# Patient Record
Sex: Female | Born: 1985 | Race: Black or African American | Hispanic: No | Marital: Single | Smoking: Former smoker
Health system: Southern US, Community
[De-identification: ages and names within clinical notes are randomized; demographics above are authoritative.]

## PROBLEM LIST (undated history)

## (undated) ENCOUNTER — Inpatient Hospital Stay (HOSPITAL_COMMUNITY): Payer: Self-pay

## (undated) ENCOUNTER — Emergency Department (HOSPITAL_COMMUNITY): Disposition: A | Discharge status: Home / Self Care | Payer: Medicaid Other

## (undated) DIAGNOSIS — N83209 Unspecified ovarian cyst, unspecified side: Secondary | ICD-10-CM

## (undated) DIAGNOSIS — A6000 Herpesviral infection of urogenital system, unspecified: Secondary | ICD-10-CM

## (undated) DIAGNOSIS — C801 Malignant (primary) neoplasm, unspecified: Secondary | ICD-10-CM

## (undated) DIAGNOSIS — F32A Depression, unspecified: Secondary | ICD-10-CM

## (undated) DIAGNOSIS — G43909 Migraine, unspecified, not intractable, without status migrainosus: Secondary | ICD-10-CM

## (undated) DIAGNOSIS — O139 Gestational [pregnancy-induced] hypertension without significant proteinuria, unspecified trimester: Secondary | ICD-10-CM

## (undated) DIAGNOSIS — A549 Gonococcal infection, unspecified: Secondary | ICD-10-CM

## (undated) DIAGNOSIS — Q613 Polycystic kidney, unspecified: Secondary | ICD-10-CM

## (undated) DIAGNOSIS — F419 Anxiety disorder, unspecified: Secondary | ICD-10-CM

## (undated) DIAGNOSIS — O24419 Gestational diabetes mellitus in pregnancy, unspecified control: Secondary | ICD-10-CM

## (undated) DIAGNOSIS — A749 Chlamydial infection, unspecified: Secondary | ICD-10-CM

## (undated) DIAGNOSIS — Z8719 Personal history of other diseases of the digestive system: Secondary | ICD-10-CM

## (undated) DIAGNOSIS — K219 Gastro-esophageal reflux disease without esophagitis: Secondary | ICD-10-CM

## (undated) DIAGNOSIS — R87619 Unspecified abnormal cytological findings in specimens from cervix uteri: Secondary | ICD-10-CM

## (undated) DIAGNOSIS — M1990 Unspecified osteoarthritis, unspecified site: Secondary | ICD-10-CM

## (undated) DIAGNOSIS — D649 Anemia, unspecified: Secondary | ICD-10-CM

## (undated) DIAGNOSIS — F319 Bipolar disorder, unspecified: Secondary | ICD-10-CM

## (undated) DIAGNOSIS — F329 Major depressive disorder, single episode, unspecified: Secondary | ICD-10-CM

## (undated) DIAGNOSIS — O1490 Unspecified pre-eclampsia, unspecified trimester: Secondary | ICD-10-CM

## (undated) DIAGNOSIS — Z923 Personal history of irradiation: Secondary | ICD-10-CM

## (undated) HISTORY — PX: DILATION AND CURETTAGE OF UTERUS: SHX78

## (undated) HISTORY — DX: Gestational diabetes mellitus in pregnancy, unspecified control: O24.419

## (undated) HISTORY — PX: COLPOSCOPY: SHX161

## (undated) HISTORY — PX: TONSILLECTOMY: SUR1361

---
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
## 2006-02-10 ENCOUNTER — Inpatient Hospital Stay (HOSPITAL_COMMUNITY): Admission: AD | Admit: 2006-02-10 | Discharge: 2006-02-10 | Payer: Self-pay | Admitting: Family Medicine

## 2006-02-23 ENCOUNTER — Inpatient Hospital Stay (HOSPITAL_COMMUNITY): Admission: AD | Admit: 2006-02-23 | Discharge: 2006-02-23 | Payer: Self-pay | Admitting: Gynecology

## 2006-03-25 ENCOUNTER — Inpatient Hospital Stay (HOSPITAL_COMMUNITY): Admission: AD | Admit: 2006-03-25 | Discharge: 2006-03-25 | Payer: Self-pay | Admitting: Family Medicine

## 2006-04-26 ENCOUNTER — Inpatient Hospital Stay (HOSPITAL_COMMUNITY): Admission: AD | Admit: 2006-04-26 | Discharge: 2006-04-26 | Payer: Self-pay | Admitting: Obstetrics and Gynecology

## 2006-06-26 ENCOUNTER — Inpatient Hospital Stay (HOSPITAL_COMMUNITY): Admission: AD | Admit: 2006-06-26 | Discharge: 2006-06-26 | Payer: Self-pay | Admitting: Obstetrics

## 2006-07-04 ENCOUNTER — Inpatient Hospital Stay (HOSPITAL_COMMUNITY): Admission: AD | Admit: 2006-07-04 | Discharge: 2006-07-04 | Payer: Self-pay | Admitting: Obstetrics

## 2006-08-05 ENCOUNTER — Inpatient Hospital Stay (HOSPITAL_COMMUNITY): Admission: AD | Admit: 2006-08-05 | Discharge: 2006-08-05 | Payer: Self-pay | Admitting: Obstetrics and Gynecology

## 2006-08-05 ENCOUNTER — Ambulatory Visit: Payer: Self-pay | Admitting: Obstetrics and Gynecology

## 2006-09-06 ENCOUNTER — Inpatient Hospital Stay (HOSPITAL_COMMUNITY): Admission: AD | Admit: 2006-09-06 | Discharge: 2006-09-07 | Payer: Self-pay | Admitting: Obstetrics and Gynecology

## 2006-09-09 ENCOUNTER — Inpatient Hospital Stay (HOSPITAL_COMMUNITY): Admission: AD | Admit: 2006-09-09 | Discharge: 2006-09-09 | Payer: Self-pay | Admitting: Obstetrics and Gynecology

## 2006-09-25 ENCOUNTER — Inpatient Hospital Stay (HOSPITAL_COMMUNITY): Admission: AD | Admit: 2006-09-25 | Discharge: 2006-09-25 | Payer: Self-pay | Admitting: Obstetrics and Gynecology

## 2006-10-05 ENCOUNTER — Inpatient Hospital Stay (HOSPITAL_COMMUNITY): Admission: RE | Admit: 2006-10-05 | Discharge: 2006-10-07 | Payer: Self-pay | Admitting: Obstetrics and Gynecology

## 2007-03-10 ENCOUNTER — Inpatient Hospital Stay (HOSPITAL_COMMUNITY): Admission: AD | Admit: 2007-03-10 | Discharge: 2007-03-10 | Payer: Self-pay | Admitting: Obstetrics and Gynecology

## 2007-08-04 ENCOUNTER — Emergency Department (HOSPITAL_COMMUNITY): Admission: EM | Admit: 2007-08-04 | Discharge: 2007-08-04 | Payer: Self-pay | Admitting: Emergency Medicine

## 2008-07-05 ENCOUNTER — Emergency Department (HOSPITAL_COMMUNITY): Admission: EM | Admit: 2008-07-05 | Discharge: 2008-07-05 | Payer: Self-pay | Admitting: Emergency Medicine

## 2009-03-05 ENCOUNTER — Ambulatory Visit: Payer: Self-pay | Admitting: Physician Assistant

## 2009-03-05 ENCOUNTER — Inpatient Hospital Stay (HOSPITAL_COMMUNITY): Admission: AD | Admit: 2009-03-05 | Discharge: 2009-03-05 | Payer: Self-pay | Admitting: Obstetrics & Gynecology

## 2009-05-16 ENCOUNTER — Inpatient Hospital Stay (HOSPITAL_COMMUNITY): Admission: AD | Admit: 2009-05-16 | Discharge: 2009-05-16 | Payer: Self-pay | Admitting: Obstetrics & Gynecology

## 2009-11-14 ENCOUNTER — Emergency Department (HOSPITAL_COMMUNITY): Admission: EM | Admit: 2009-11-14 | Discharge: 2009-11-14 | Payer: Self-pay | Admitting: Family Medicine

## 2010-04-14 ENCOUNTER — Inpatient Hospital Stay (HOSPITAL_COMMUNITY)
Admission: AD | Admit: 2010-04-14 | Discharge: 2010-04-14 | Payer: Self-pay | Source: Home / Self Care | Attending: Obstetrics & Gynecology | Admitting: Obstetrics & Gynecology

## 2010-06-10 ENCOUNTER — Inpatient Hospital Stay (HOSPITAL_COMMUNITY)
Admission: AD | Admit: 2010-06-10 | Discharge: 2010-06-10 | Disposition: A | Discharge status: Home / Self Care | Payer: Self-pay | Source: Ambulatory Visit | Attending: Obstetrics and Gynecology | Admitting: Obstetrics and Gynecology

## 2010-06-10 DIAGNOSIS — N949 Unspecified condition associated with female genital organs and menstrual cycle: Secondary | ICD-10-CM | POA: Insufficient documentation

## 2010-06-10 DIAGNOSIS — A6000 Herpesviral infection of urogenital system, unspecified: Secondary | ICD-10-CM | POA: Insufficient documentation

## 2010-06-10 LAB — WET PREP, GENITAL
Clue Cells Wet Prep HPF POC: NONE SEEN
Trich, Wet Prep: NONE SEEN
Yeast Wet Prep HPF POC: NONE SEEN

## 2010-06-14 LAB — GC/CHLAMYDIA PROBE AMP, GENITAL
Chlamydia, DNA Probe: NEGATIVE
GC Probe Amp, Genital: NEGATIVE

## 2010-07-08 ENCOUNTER — Encounter: Payer: Self-pay | Admitting: Obstetrics and Gynecology

## 2010-07-11 LAB — CBC
HCT: 36.3 % (ref 36.0–46.0)
Hemoglobin: 12.2 g/dL (ref 12.0–15.0)
MCH: 28.2 pg (ref 26.0–34.0)
MCHC: 33.6 g/dL (ref 30.0–36.0)
MCV: 83.8 fL (ref 78.0–100.0)
Platelets: 251 10*3/uL (ref 150–400)
RBC: 4.33 MIL/uL (ref 3.87–5.11)
RDW: 13.2 % (ref 11.5–15.5)
WBC: 7.5 10*3/uL (ref 4.0–10.5)

## 2010-07-11 LAB — WET PREP, GENITAL
Trich, Wet Prep: NONE SEEN
Yeast Wet Prep HPF POC: NONE SEEN

## 2010-07-11 LAB — URINALYSIS, ROUTINE W REFLEX MICROSCOPIC
Bilirubin Urine: NEGATIVE
Glucose, UA: NEGATIVE mg/dL
Hgb urine dipstick: NEGATIVE
Ketones, ur: NEGATIVE mg/dL
Nitrite: NEGATIVE
Protein, ur: NEGATIVE mg/dL
Specific Gravity, Urine: 1.015 (ref 1.005–1.030)
Urobilinogen, UA: 0.2 mg/dL (ref 0.0–1.0)
pH: 7 (ref 5.0–8.0)

## 2010-07-11 LAB — ABO/RH: ABO/RH(D): AB POS

## 2010-07-11 LAB — GC/CHLAMYDIA PROBE AMP, GENITAL
Chlamydia, DNA Probe: NEGATIVE
GC Probe Amp, Genital: NEGATIVE

## 2010-07-11 LAB — HCG, QUANTITATIVE, PREGNANCY: hCG, Beta Chain, Quant, S: 86037 m[IU]/mL — ABNORMAL HIGH (ref <5)

## 2010-07-11 LAB — POCT PREGNANCY, URINE: Preg Test, Ur: POSITIVE

## 2010-07-11 LAB — URINE MICROSCOPIC-ADD ON

## 2010-07-16 LAB — POCT URINALYSIS DIP (DEVICE)
Bilirubin Urine: NEGATIVE
Glucose, UA: NEGATIVE mg/dL
Hgb urine dipstick: NEGATIVE
Ketones, ur: NEGATIVE mg/dL
Nitrite: NEGATIVE
Protein, ur: NEGATIVE mg/dL
Specific Gravity, Urine: 1.025 (ref 1.005–1.030)
Urobilinogen, UA: 0.2 mg/dL (ref 0.0–1.0)
pH: 6 (ref 5.0–8.0)

## 2010-07-16 LAB — POCT PREGNANCY, URINE: Preg Test, Ur: NEGATIVE

## 2010-07-16 LAB — GC/CHLAMYDIA PROBE AMP, GENITAL: GC Probe Amp, Genital: POSITIVE — AB

## 2010-07-16 LAB — WET PREP, GENITAL: Trich, Wet Prep: NONE SEEN

## 2010-07-17 LAB — CBC
Hemoglobin: 12.4 g/dL (ref 12.0–15.0)
Platelets: 230 10*3/uL (ref 150–400)
RDW: 13 % (ref 11.5–15.5)
WBC: 6.5 10*3/uL (ref 4.0–10.5)

## 2010-07-17 LAB — URINALYSIS, ROUTINE W REFLEX MICROSCOPIC
Glucose, UA: NEGATIVE mg/dL
Ketones, ur: NEGATIVE mg/dL
Nitrite: NEGATIVE
pH: 6 (ref 5.0–8.0)

## 2010-07-17 LAB — GC/CHLAMYDIA PROBE AMP, GENITAL
Chlamydia, DNA Probe: POSITIVE — AB
GC Probe Amp, Genital: POSITIVE — AB

## 2010-07-17 LAB — POCT PREGNANCY, URINE: Preg Test, Ur: POSITIVE

## 2010-07-17 LAB — ABO/RH: ABO/RH(D): AB POS

## 2010-07-17 LAB — WET PREP, GENITAL: Trich, Wet Prep: NONE SEEN

## 2010-09-13 NOTE — Discharge Summary (Signed)
[name], [name]               ACCOUNT NO.:  [id number]   MEDICAL RECORD NO.:  [id number]          PATIENT TYPE:  INP   LOCATION:  9111                          [facility]ACILITY:  [facility]   PHYSICIAN:  Robin Horn, M.D. DATE OF BIRTH:  [birth year]   DATE OF ADMISSION:  10/05/2006  DATE OF DISCHARGE:  10/07/2006                               DISCHARGE SUMMARY   ADMISSION DIAGNOSES:  1. Intrauterine pregnancy at term.  2. Favorable cervix.   DISCHARGE DIAGNOSES:  1. Intrauterine pregnancy at term.  2. Favorable cervix.  3. Delivered via spontaneous vaginal delivery.   HPI:  A 24 year old G3, P2-0-0-2, with an EDC of October 12, 2006, by  ultrasound who is admitted for induction of labor.   PRENATAL LABS:  AB positive, antibody screen negative, sickle cell  negative, RPR nonreactive, rubella immune, hepatitis B surface antigen  negative, HIV negative, gonorrhea and Chlamydia negative.  A 1-hour  Glucola of 138, a 3-hour Glucola of 82, 165, 111, 116.  Group B Strep  was negative.  Although the patient did not receive significant early  prenatal care, she did have an ultrasound on February 10, 2006, at 5  weeks and 2 days, which gave her an [facility] of October 12, 2006.  An ultrasound  on June 05, 2006, provided an estimated gestational age of [redacted] weeks,  was consistent with an [facility] of October 10, 2006.  She began her prenatal  care with Dr. Gaynell Face in January and transferred to our practice in  April.  She had an ultrasound performed for size less than dates on  August 17, 2006, which showed an estimated fetal weight that was 56% and  a normal AFI.  On Sep 21, 2006, she had 3 or 4 early vesicles on her  right lower labia that were tender and some right inguinal nodes.  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  She was treated with Acyclovir 200 five times a day and she has remained  on the prescription.  On Sep 27, 2006, all signs and symptoms of the  herpes lesion  were gone.  She was admitted on the 6th for induction of  labor.   PAST MEDICAL HISTORY:  Not significant.   PAST SURGICAL HISTORY:  Not significant.   PAST OB/GYN HISTORY:  1. G1 in 2004, 8 pounds 6 ounce female infant vaginally.  2. In 2006, a 7 pound 5 ounce female vaginal delivery.  She received 2      units of blood for some postpartum hemorrhage.  3. G3 is the present pregnancy.  4. She has a history of herpes.   MEDICATIONS:  None.   ALLERGIES:  NO KNOWN DRUG ALLERGIES.   She denies alcohol or drug use.  She smokes approximately 1/2 pack of  cigarettes per day.   FAMILY HISTORY:  Negative.   On admission, she had a benign exam.  Cervix was 2-3 cm dilated, 60%  effaced and -2 station.  A vulvar inspection was done and no lesions  were revealed.  She was started on IV Pitocin  and monitored for  progress.  Her intrapartum course was relatively uncomplicated.  She had  a bradycardia after rupture of membranes which was relieved with oxygen  and position change as well as ephedrine and scalp lead placement.  The  patient then had another decel which was relieved with oxygen and  position change.  She progressed rapidly to complete plus 3 with a decel  to the 70's and with several pushes delivered a viable female infant  from OA to LOT at 1732 with Apgars of 9 at one minute, 9 at five  minutes, and a weight of 7 pounds 10 ounces.  The placenta was delivered  intact at 1738, no laceration.  The EBL was less than 500 mL.  Her  postpartum course was uncomplicated.  She remained afebrile with stable  vital signs throughout.  Her hemoglobin decreased from 11.2 to 9.3.  She  was discharged to home on postpartum day #2 with routine discharge  instructions and numbers to call with any questions or problems.  She  was discharged with prescriptions for Motrin, Vicodin, prenatal  vitamins.  We discussed with her plans for contraception.  She wants [place]  to check her benefits for an Implanon.   She will follow up in the office  in 6 weeks.   DISCHARGE INFORMATION:  1. AB positive, rubella immune.  2. She plans to bottle feed.  3. She wants to try an Implanon.  4. Her hemoglobin decreased from 11.2 to 9.3.      Robin Monday, MD  Electronically Signed     ______________________________  Robin Horn, M.D.    [name]  D:  10/07/2006  T:  10/07/2006  Job:  [id number]

## 2010-09-16 NOTE — Discharge Summary (Signed)
[name], Robin Horn               ACCOUNT NO.:  [id number]   MEDICAL RECORD NO.:  [id number]          PATIENT TYPE:  INP   LOCATION:  9115                          [facility]ILITY[facility]  [facility]   PHYSICIAN:  Conni Elliot, M.D.DATE OF BIRTH:  [birth year]   DATE OF ADMISSION:  09/13/2004  DATE OF DISCHARGE:  09/15/2004                                 DISCHARGE SUMMARY   This 25 year old G2 P2-0-0-2 presented for induction of labor at 52 and four-  sevenths weeks, received Cervidil, and went on to normal spontaneous vaginal  delivery with epidural anesthesia. The patient was GBS positive, received  penicillin. Nuchal cord x1. Three-vessel cord. Spontaneous placenta.  Estimated blood loss less than 500 mL. No laceration. Apgars 8 and 9. Mom  plans to bottle feed. Received Depo IM. GBS positive status post treatment.  Blood type AB positive, Rh negative, rubella immune. The patient required  postpartum packed red blood cell transfusion secondary to low hemoglobin.  The patient was hemodynamically stable at discharge. Discharged on ibuprofen  600 mg p.o. q.6h. p.r.n. with food.       MBV/MEDQ  D:  10/14/2004  T:  10/14/2004  Job:  [id number]

## 2010-09-22 ENCOUNTER — Inpatient Hospital Stay (HOSPITAL_COMMUNITY)
Admission: AD | Admit: 2010-09-22 | Discharge: 2010-09-22 | Disposition: A | Discharge status: Home / Self Care | Payer: Self-pay | Source: Ambulatory Visit | Attending: Obstetrics & Gynecology | Admitting: Obstetrics & Gynecology

## 2010-09-22 DIAGNOSIS — B9689 Other specified bacterial agents as the cause of diseases classified elsewhere: Secondary | ICD-10-CM

## 2010-09-22 DIAGNOSIS — A6000 Herpesviral infection of urogenital system, unspecified: Secondary | ICD-10-CM

## 2010-09-22 DIAGNOSIS — N760 Acute vaginitis: Secondary | ICD-10-CM

## 2010-09-22 DIAGNOSIS — A499 Bacterial infection, unspecified: Secondary | ICD-10-CM

## 2010-09-22 LAB — URINALYSIS, ROUTINE W REFLEX MICROSCOPIC
Glucose, UA: NEGATIVE mg/dL
Nitrite: NEGATIVE
Specific Gravity, Urine: >1.03 — ABNORMAL HIGH (ref 1.005–1.030)
pH: 6 (ref 5.0–8.0)

## 2010-09-22 LAB — WET PREP, GENITAL
Trich, Wet Prep: NONE SEEN
Yeast Wet Prep HPF POC: NONE SEEN

## 2010-09-22 LAB — POCT PREGNANCY, URINE: Preg Test, Ur: NEGATIVE

## 2010-12-08 ENCOUNTER — Emergency Department (HOSPITAL_COMMUNITY)
Admission: EM | Admit: 2010-12-08 | Discharge: 2010-12-08 | Disposition: A | Discharge status: Home / Self Care | Payer: Self-pay | Attending: Emergency Medicine | Admitting: Emergency Medicine

## 2010-12-08 DIAGNOSIS — K089 Disorder of teeth and supporting structures, unspecified: Secondary | ICD-10-CM | POA: Insufficient documentation

## 2010-12-08 DIAGNOSIS — F3130 Bipolar disorder, current episode depressed, mild or moderate severity, unspecified: Secondary | ICD-10-CM | POA: Insufficient documentation

## 2010-12-08 DIAGNOSIS — K047 Periapical abscess without sinus: Secondary | ICD-10-CM | POA: Insufficient documentation

## 2010-12-08 DIAGNOSIS — R220 Localized swelling, mass and lump, head: Secondary | ICD-10-CM | POA: Insufficient documentation

## 2010-12-08 DIAGNOSIS — K029 Dental caries, unspecified: Secondary | ICD-10-CM | POA: Insufficient documentation

## 2010-12-13 ENCOUNTER — Emergency Department (HOSPITAL_COMMUNITY): Payer: Self-pay

## 2010-12-13 ENCOUNTER — Encounter (HOSPITAL_COMMUNITY): Payer: Self-pay

## 2010-12-13 ENCOUNTER — Emergency Department (HOSPITAL_COMMUNITY)
Admission: EM | Admit: 2010-12-13 | Discharge: 2010-12-13 | Disposition: A | Discharge status: Home / Self Care | Payer: Self-pay | Attending: Emergency Medicine | Admitting: Emergency Medicine

## 2010-12-13 DIAGNOSIS — J029 Acute pharyngitis, unspecified: Secondary | ICD-10-CM | POA: Insufficient documentation

## 2010-12-13 DIAGNOSIS — R599 Enlarged lymph nodes, unspecified: Secondary | ICD-10-CM | POA: Insufficient documentation

## 2010-12-13 DIAGNOSIS — R000 Tachycardia, unspecified: Secondary | ICD-10-CM | POA: Insufficient documentation

## 2010-12-13 DIAGNOSIS — R6884 Jaw pain: Secondary | ICD-10-CM | POA: Insufficient documentation

## 2010-12-13 LAB — DIFFERENTIAL
Eosinophils Absolute: 0 10*3/uL (ref 0.0–0.7)
Eosinophils Relative: 0 % (ref 0–5)
Lymphs Abs: 1.2 10*3/uL (ref 0.7–4.0)
Monocytes Absolute: 1.1 10*3/uL — ABNORMAL HIGH (ref 0.1–1.0)
Monocytes Relative: 16 % — ABNORMAL HIGH (ref 3–12)
Neutrophils Relative %: 66 % (ref 43–77)

## 2010-12-13 LAB — CBC
MCH: 27.7 pg (ref 26.0–34.0)
MCHC: 32.1 g/dL (ref 30.0–36.0)
MCV: 86.2 fL (ref 78.0–100.0)
Platelets: 257 10*3/uL (ref 150–400)
RBC: 4.12 MIL/uL (ref 3.87–5.11)

## 2010-12-13 LAB — POCT I-STAT, CHEM 8
BUN: 5 mg/dL — ABNORMAL LOW (ref 6–23)
Calcium, Ion: 1.12 mmol/L (ref 1.12–1.32)
Chloride: 105 mEq/L (ref 96–112)
Creatinine, Ser: 1 mg/dL (ref 0.50–1.10)
Glucose, Bld: 87 mg/dL (ref 70–99)

## 2010-12-13 MED ORDER — IOHEXOL 300 MG/ML  SOLN
100.0000 mL | Freq: Once | INTRAMUSCULAR | Status: AC | PRN
  Administered 2010-12-13: 100 mL via INTRAVENOUS

## 2011-01-18 ENCOUNTER — Inpatient Hospital Stay (HOSPITAL_COMMUNITY)
Admission: AD | Admit: 2011-01-18 | Discharge: 2011-01-18 | Disposition: A | Discharge status: Home / Self Care | Payer: Self-pay | Source: Ambulatory Visit | Attending: Obstetrics & Gynecology | Admitting: Obstetrics & Gynecology

## 2011-01-18 ENCOUNTER — Inpatient Hospital Stay (HOSPITAL_COMMUNITY): Payer: Self-pay

## 2011-01-18 ENCOUNTER — Encounter (HOSPITAL_COMMUNITY): Payer: Self-pay

## 2011-01-18 DIAGNOSIS — A549 Gonococcal infection, unspecified: Secondary | ICD-10-CM | POA: Insufficient documentation

## 2011-01-18 DIAGNOSIS — K047 Periapical abscess without sinus: Secondary | ICD-10-CM | POA: Insufficient documentation

## 2011-01-18 DIAGNOSIS — O99891 Other specified diseases and conditions complicating pregnancy: Secondary | ICD-10-CM | POA: Insufficient documentation

## 2011-01-18 HISTORY — DX: Chlamydial infection, unspecified: A74.9

## 2011-01-18 HISTORY — DX: Unspecified abnormal cytological findings in specimens from cervix uteri: R87.619

## 2011-01-18 HISTORY — DX: Gonococcal infection, unspecified: A54.9

## 2011-01-18 LAB — CBC
HCT: 32.6 % — ABNORMAL LOW (ref 36.0–46.0)
MCH: 28.1 pg (ref 26.0–34.0)
MCV: 84.9 fL (ref 78.0–100.0)
Platelets: 214 10*3/uL (ref 150–400)
RDW: 13.7 % (ref 11.5–15.5)
WBC: 6.5 10*3/uL (ref 4.0–10.5)

## 2011-01-18 LAB — URINALYSIS, ROUTINE W REFLEX MICROSCOPIC
Glucose, UA: NEGATIVE mg/dL
Hgb urine dipstick: NEGATIVE
Protein, ur: NEGATIVE mg/dL

## 2011-01-18 LAB — DIFFERENTIAL
Basophils Absolute: 0 10*3/uL (ref 0.0–0.1)
Eosinophils Absolute: 0 10*3/uL (ref 0.0–0.7)
Eosinophils Relative: 1 % (ref 0–5)
Lymphocytes Relative: 26 % (ref 12–46)
Monocytes Absolute: 0.5 10*3/uL (ref 0.1–1.0)

## 2011-01-18 LAB — WET PREP, GENITAL
Clue Cells Wet Prep HPF POC: NONE SEEN
Trich, Wet Prep: NONE SEEN
Yeast Wet Prep HPF POC: NONE SEEN

## 2011-01-18 MED ORDER — AMOXICILLIN 500 MG PO CAPS: 500.0000 mg | ORAL_CAPSULE | Freq: Three times a day (TID) | ORAL | Status: AC

## 2011-01-18 MED ORDER — HYDROCODONE-ACETAMINOPHEN 5-325 MG PO TABS: 2.0000 | ORAL_TABLET | ORAL | Status: AC | PRN

## 2011-01-18 NOTE — Progress Notes (Signed)
Pt states is awaiting medicaid to get back right lower tooth pulled, has abscess, now has pain down side of her face into her right side. Pt also has mucusy vaginal d/c, non-odorous, no bleeding at present. Did have spotting earlier in week, none now.

## 2011-01-18 NOTE — ED Provider Notes (Signed)
History     CSN: [id number] Arrival date & time: 01/18/2011  8:49 AM   Chief Complaint  Patient presents with  . Dental Pain     (Include location/radiation/quality/duration/timing/severity/associated sxs/prior treatment) HPI Robin Horn is a 25 y.o. female @ [redacted] weeks gestation who presents to MAU for abscessed tooth. She was evaluated at [facility] 2 weeks ago and given penicillin. She finished the medication and now the swelling and pain has returned. She called the health department and was instructed to come to MAU for evaluation of the abscess and the pregnancy. The patient reports vaginal discharge for the past few days and left side shooting abdominal pain. Vaginal spotting last week. The history was provided by the patient.   No past medical history on file.   No past surgical history on file.  No family history on file.  History  Substance Use Topics  . Smoking status: Not on file  . Smokeless tobacco: Not on file  . Alcohol Use: Not on file    OB History    Grav Para Term Preterm Abortions TAB SAB Ect Mult Living   1               Review of Systems  Constitutional: Positive for fatigue.  HENT: Positive for dental problem.   Gastrointestinal: Positive for abdominal pain.  Genitourinary: Positive for frequency and vaginal discharge.  Musculoskeletal: Positive for back pain.  All other systems reviewed and are negative.    Allergies  Lactose intolerance (gi)  Home Medications  No current outpatient prescriptions on file.  Physical Exam    BP 117/77  Pulse 64  Temp(Src) 98.2 F (36.8 C) (Oral)  Resp 16  Ht 5\' 3"  (1.6 m)  Wt 130 lb (58.968 kg)  BMI 23.03 kg/m2  LMP 11/14/2010  Physical Exam  Nursing note and vitals reviewed. Constitutional: She is oriented to person, place, and time. She appears well-developed and well-nourished.  HENT:  Head: Normocephalic.  Eyes: EOM are normal.  Neck: Neck supple.  Pulmonary/Chest: Effort  normal.  Abdominal: Soft.       Minimal tenderness LLQ  Genitourinary:       White vaginal discharge. Cervix closed, no CMT, mild left adnexal tenderness. Uterus approximately 10 week size.   Musculoskeletal: Normal range of motion.  Neurological: She is alert and oriented to person, place, and time. No cranial nerve deficit.  Skin: Skin is warm and dry.    ED Course  Procedures Ultrasound: 9 week 2 day IUP with cardiac activity at 155, no [facility]  Assessment: Dental abscess   IUP at 9 weeks 2 days  Plan: Amoxicillin 500 mg. Po tid x 10 d  Hydrocodone 5/325 mg. Every 4 to 6 hours prn pain # 15  Dental care as soon as possible  Start prenatal care.           [place], [place] 01/23/11 0221

## 2011-01-19 LAB — GC/CHLAMYDIA PROBE AMP, GENITAL
Chlamydia, DNA Probe: NEGATIVE
GC Probe Amp, Genital: NEGATIVE

## 2011-02-03 ENCOUNTER — Inpatient Hospital Stay (HOSPITAL_COMMUNITY)
Admission: AD | Admit: 2011-02-03 | Discharge: 2011-02-03 | Discharge status: Against Medical Advice | Payer: Medicaid Other | Source: Ambulatory Visit | Attending: Obstetrics and Gynecology | Admitting: Obstetrics and Gynecology

## 2011-02-03 DIAGNOSIS — R109 Unspecified abdominal pain: Secondary | ICD-10-CM | POA: Insufficient documentation

## 2011-02-03 DIAGNOSIS — N949 Unspecified condition associated with female genital organs and menstrual cycle: Secondary | ICD-10-CM | POA: Insufficient documentation

## 2011-02-03 NOTE — Progress Notes (Signed)
Pt states, " I started taking an antibiotic 2 days ago, and I started having vaginal itching right afterward. I started on Monistat on Wed and it got worse and now I'm having vaginal swelling and the itching is worse."

## 2011-02-03 NOTE — Progress Notes (Signed)
Pt states, " I'm also having pain in my lower right abdomen that shoots into my back. It is on and off for 2-3 wks."

## 2011-02-05 ENCOUNTER — Inpatient Hospital Stay (HOSPITAL_COMMUNITY)
Admission: AD | Admit: 2011-02-05 | Discharge: 2011-02-05 | Disposition: A | Discharge status: Home / Self Care | Payer: Medicaid Other | Source: Ambulatory Visit | Attending: Obstetrics and Gynecology | Admitting: Obstetrics and Gynecology

## 2011-02-05 ENCOUNTER — Encounter (HOSPITAL_COMMUNITY): Payer: Self-pay | Admitting: Advanced Practice Midwife

## 2011-02-05 DIAGNOSIS — L293 Anogenital pruritus, unspecified: Secondary | ICD-10-CM | POA: Insufficient documentation

## 2011-02-05 DIAGNOSIS — R300 Dysuria: Secondary | ICD-10-CM | POA: Insufficient documentation

## 2011-02-05 DIAGNOSIS — B373 Candidiasis of vulva and vagina: Secondary | ICD-10-CM

## 2011-02-05 DIAGNOSIS — O99891 Other specified diseases and conditions complicating pregnancy: Secondary | ICD-10-CM | POA: Insufficient documentation

## 2011-02-05 LAB — URINALYSIS, ROUTINE W REFLEX MICROSCOPIC
Bilirubin Urine: NEGATIVE
Glucose, UA: NEGATIVE mg/dL
Hgb urine dipstick: NEGATIVE
Ketones, ur: NEGATIVE mg/dL
Leukocytes, UA: NEGATIVE
Nitrite: NEGATIVE
Protein, ur: NEGATIVE mg/dL
Specific Gravity, Urine: 1.015 (ref 1.005–1.030)
Urobilinogen, UA: 0.2 mg/dL (ref 0.0–1.0)
pH: 7 (ref 5.0–8.0)

## 2011-02-05 LAB — WET PREP, GENITAL
Clue Cells Wet Prep HPF POC: NONE SEEN
Trich, Wet Prep: NONE SEEN
Yeast Wet Prep HPF POC: NONE SEEN

## 2011-02-05 MED ORDER — FLUCONAZOLE 150 MG PO TABS: 150.0000 mg | ORAL_TABLET | Freq: Once | ORAL | Status: AC

## 2011-02-05 MED ORDER — FLUCONAZOLE 150 MG PO TABS
150.0000 mg | ORAL_TABLET | Freq: Once | ORAL | Status: AC
  Administered 2011-02-05: 150 mg via ORAL
  Filled 2011-02-05: qty 1

## 2011-02-05 NOTE — ED Provider Notes (Signed)
History     Chief Complaint  Patient presents with  . Vaginal Itching  . Dysuria  . Back Pain   HPI Itching and burning started 4-5 days ago, some spotting and cramping 2 or 3 days ago, none since. Also has abcess tooth x 1 month - on amoxicillin for this currently.   OB History    Grav Para Term Preterm Abortions TAB SAB Ect Mult Living   7 3 3  0 3 3 0 0 0 3      Past Medical History  Diagnosis Date  . Abnormal Pap smear of cervix   . Gonorrhea   . Chlamydia     Past Surgical History  Procedure Date  . Colposcopy     No family history on file.  History  Substance Use Topics  . Smoking status: Current Everyday Smoker -- 0.5 packs/day  . Smokeless tobacco: Never Used  . Alcohol Use: No    Allergies:  Allergies  Allergen Reactions  . Lactose Intolerance (Gi)     lactose    Prescriptions prior to admission  Medication Sig Dispense Refill  . valACYclovir (VALTREX) 500 MG tablet Take 250 mg by mouth daily. Patient takes one half of the 500mg  Valtrex daily to equal 250mg          Review of Systems  Constitutional: Negative.   HENT:       + tooth pain   Respiratory: Negative.   Cardiovascular: Negative.   Gastrointestinal: Negative for nausea, vomiting, abdominal pain, diarrhea and constipation.  Genitourinary: Negative for dysuria, urgency, frequency, hematuria and flank pain.       Negative for vaginal bleeding, cramping/contractions, Positive for vaginal discharge, itching and burning   Musculoskeletal: Negative.   Neurological: Positive for headaches.  Psychiatric/Behavioral: Negative.    Physical Exam   Blood pressure 129/75, pulse 84, temperature 99.2 F (37.3 C), temperature source Oral, resp. rate 16, height 5\' 3"  (1.6 m), weight 58.06 kg (128 lb), last menstrual period 11/14/2010.  Physical Exam  Nursing note and vitals reviewed. Constitutional: She is oriented to person, place, and time. She appears well-developed and well-nourished. No  distress.  HENT:  Head: Normocephalic and atraumatic.  Cardiovascular: Normal rate.   Respiratory: Effort normal.  GI: Soft. Bowel sounds are normal. She exhibits no mass. There is no tenderness. There is no rebound and no guarding.  Genitourinary: There is tenderness on the right labia. There is no rash or lesion on the right labia. There is tenderness on the left labia. There is no rash or lesion on the left labia. Uterus is not tender. Enlarged: Size c/w dates. Cervix exhibits no motion tenderness, no discharge and no friability. Right adnexum displays no mass, no tenderness and no fullness. Left adnexum displays no mass, no tenderness and no fullness. No tenderness or bleeding around the vagina. Vaginal discharge (white) found.  Musculoskeletal: Normal range of motion.  Neurological: She is alert and oriented to person, place, and time.  Skin: Skin is warm and dry.  Psychiatric: She has a normal mood and affect.    MAU Course  Procedures  Results for orders placed during the hospital encounter of 02/05/11 (from the past 24 hour(s))  URINALYSIS, ROUTINE W REFLEX MICROSCOPIC     Status: Normal   Collection Time   02/05/11  5:10 PM      Component Value Range   Color, Urine YELLOW  YELLOW    Appearance CLEAR  CLEAR    Specific Gravity, Urine 1.015  1.005 -  1.030    pH 7.0  5.0 - 8.0    Glucose, UA NEGATIVE  NEGATIVE (mg/dL)   Hgb urine dipstick NEGATIVE  NEGATIVE    Bilirubin Urine NEGATIVE  NEGATIVE    Ketones, ur NEGATIVE  NEGATIVE (mg/dL)   Protein, ur NEGATIVE  NEGATIVE (mg/dL)   Urobilinogen, UA 0.2  0.0 - 1.0 (mg/dL)   Nitrite NEGATIVE  NEGATIVE    Leukocytes, UA NEGATIVE  NEGATIVE   WET PREP, GENITAL     Status: Abnormal   Collection Time   02/05/11  5:20 PM      Component Value Range   Yeast, Wet Prep NONE SEEN  NONE SEEN    Trich, Wet Prep NONE SEEN  NONE SEEN    Clue Cells, Wet Prep NONE SEEN  NONE SEEN    WBC, Wet Prep HPF POC FEW (*) NONE SEEN      Assessment and  Plan  No yeast seen on wet prep, but yeasty discharge and irritation noted on exam, so will treat presumptively for yeast Start prenatal care ASAP - pregnancy verification letter given again  [facility] 02/05/2011, 5:46 PM

## 2011-02-05 NOTE — Progress Notes (Signed)
Onset of back pain, vaginal itching and burning for several days, now having burning with urination 11 wks 4 days

## 2011-02-06 LAB — GC/CHLAMYDIA PROBE AMP, GENITAL: Chlamydia, DNA Probe: NEGATIVE

## 2011-02-07 LAB — POCT PREGNANCY, URINE
Operator id: 25114
Preg Test, Ur: NEGATIVE

## 2011-02-16 LAB — CBC
HCT: 27.9 — ABNORMAL LOW
Hemoglobin: 9.3 — ABNORMAL LOW
MCHC: 33.2
MCHC: 33.2
MCV: 86.6
Platelets: 186
Platelets: 205
RBC: 3.22 — ABNORMAL LOW
RDW: 13.3
RDW: 13.7
WBC: 10.1

## 2011-02-16 LAB — RPR: RPR Ser Ql: NONREACTIVE

## 2011-02-26 ENCOUNTER — Encounter (HOSPITAL_COMMUNITY): Payer: Self-pay | Admitting: *Deleted

## 2011-02-26 ENCOUNTER — Inpatient Hospital Stay (HOSPITAL_COMMUNITY)
Admission: AD | Admit: 2011-02-26 | Discharge: 2011-02-26 | Disposition: A | Discharge status: Home / Self Care | Payer: Self-pay | Source: Ambulatory Visit | Attending: Obstetrics & Gynecology | Admitting: Obstetrics & Gynecology

## 2011-02-26 DIAGNOSIS — K644 Residual hemorrhoidal skin tags: Secondary | ICD-10-CM | POA: Insufficient documentation

## 2011-02-26 DIAGNOSIS — O228X9 Other venous complications in pregnancy, unspecified trimester: Secondary | ICD-10-CM | POA: Insufficient documentation

## 2011-02-26 DIAGNOSIS — K5900 Constipation, unspecified: Secondary | ICD-10-CM | POA: Insufficient documentation

## 2011-02-26 LAB — URINALYSIS, ROUTINE W REFLEX MICROSCOPIC
Ketones, ur: NEGATIVE mg/dL
Leukocytes, UA: NEGATIVE
Nitrite: NEGATIVE
Specific Gravity, Urine: 1.01 (ref 1.005–1.030)
pH: 6 (ref 5.0–8.0)

## 2011-02-26 LAB — WET PREP, GENITAL
Clue Cells Wet Prep HPF POC: NONE SEEN
Trich, Wet Prep: NONE SEEN
Yeast Wet Prep HPF POC: NONE SEEN

## 2011-02-26 NOTE — Progress Notes (Addendum)
Pt reports having pelvic/rectal pressure. Having difficulty having a BM thinks she has Hemorids. Pt thinks she may be having an outbreak of herpes as well.pt is already on valtrex

## 2011-02-26 NOTE — ED Provider Notes (Signed)
Agree with above note.  LEGGETT,KELLY H. 02/26/2011 8:31 PM

## 2011-02-26 NOTE — ED Provider Notes (Signed)
History     Chief Complaint  Patient presents with  . Pelvic Pain   HPIAmanda E Hoston25 y.o.[redacted]w[redacted]d plans care with Dr. Gaynell Face presents with hemorrhoids. G7 P3 0 3 3.  Was not using contraception.   Has "always" had 1hemorrhoid but not sure if more than 1 or if this one is bigger. Hx of constipation on and off. Rectal pain and bleeding yesterday. 1 sexual partner X 15 months.  Has vaginal discharge with odor. Denies vaginal Bleeding.  Waiting for medicaid card to begin prenatal care.  Past Medical History  Diagnosis Date  . Abnormal Pap smear of cervix   . Gonorrhea   . Chlamydia   . Herpes     Past Surgical History  Procedure Date  . Colposcopy     No family history on file.  History  Substance Use Topics  . Smoking status: Current Everyday Smoker -- 0.5 packs/day  . Smokeless tobacco: Never Used  . Alcohol Use: No    Allergies:  Allergies  Allergen Reactions  . Lactose Intolerance (Gi)     lactose    Prescriptions prior to admission  Medication Sig Dispense Refill  . Ibuprofen-Diphenhydramine Cit (ADVIL PM PO) Take 2 tablets by mouth daily as needed. For headaches.       . valACYclovir (VALTREX) 500 MG tablet Take 250 mg by mouth daily. Patient takes one half of the 500mg  Valtrex daily to equal 250mg          Review of Systems  Constitutional: Negative.   Gastrointestinal: Positive for constipation.       + for rectal pain + rectal bleeding with bowel movement yesterday.  Genitourinary:       + vaginal discharge with odor   Physical Exam   Blood pressure 113/63, pulse 94, temperature 97.6 F (36.4 C), temperature source Oral, resp. rate 18, height 5\' 3"  (1.6 Horn), weight 130 lb 9.6 oz (59.24 kg), last menstrual period 11/14/2010.  Physical Exam  Constitutional: She is oriented to person, place, and time. She appears well-developed and well-nourished.  Neck: Normal range of motion.  Respiratory: Effort normal.  GI: Soft. She exhibits no distension and no  mass. There is no tenderness. There is no rebound and no guarding.  Genitourinary: Rectal exam shows external hemorrhoid (small hemorrhoid at 11-12 o'clock without thrombosis.  ) and tenderness. Rectal exam shows no internal hemorrhoid. Uterus is enlarged (14 week size). Uterus is not tender. Cervix exhibits no motion tenderness, no discharge and no friability. No bleeding around the vagina. Vaginal discharge (small amount of white discharge with mild odor.) found.  Neurological: She is alert and oriented to person, place, and time.  Skin: Skin is warm and dry.   Results for orders placed during the hospital encounter of 02/26/11 (from the past 24 hour(s))  URINALYSIS, ROUTINE W REFLEX MICROSCOPIC     Status: Normal   Collection Time   02/26/11 11:53 AM      Component Value Range   Color, Urine YELLOW  YELLOW    Appearance CLEAR  CLEAR    Specific Gravity, Urine 1.010  1.005 - 1.030    pH 6.0  5.0 - 8.0    Glucose, UA NEGATIVE  NEGATIVE (mg/dL)   Hgb urine dipstick NEGATIVE  NEGATIVE    Bilirubin Urine NEGATIVE  NEGATIVE    Ketones, ur NEGATIVE  NEGATIVE (mg/dL)   Protein, ur NEGATIVE  NEGATIVE (mg/dL)   Urobilinogen, UA 0.2  0.0 - 1.0 (mg/dL)   Nitrite NEGATIVE  NEGATIVE    Leukocytes, UA NEGATIVE  NEGATIVE   WET PREP, GENITAL     Status: Abnormal   Collection Time   02/26/11 12:35 PM      Component Value Range   Yeast, Wet Prep NONE SEEN  NONE SEEN    Trich, Wet Prep NONE SEEN  NONE SEEN    Clue Cells, Wet Prep NONE SEEN  NONE SEEN    WBC, Wet Prep HPF POC FEW (*) NONE SEEN    MAU Course  Procedures  GC/Chl culture to lab    Assessment and Plan  A:  Constipation      External, nonthrombosed hemorrhoid      [redacted]w[redacted]d gestation  P: Encouraged warm tub baths 2X day, avoid constipation by taking stool softener, increase po fluids and add fiber to diet.  Avoid straining with bowel movement    Suggested OTC Colace for several nights when constipated.      Encouraged patient to make  appointment for prenatal care.  Robin Horn,Robin Horn 02/26/2011, 12:39 PM   Matt Holmes, NP 02/26/11 1316

## 2011-03-11 ENCOUNTER — Encounter (HOSPITAL_COMMUNITY): Payer: Self-pay

## 2011-03-11 ENCOUNTER — Inpatient Hospital Stay (HOSPITAL_COMMUNITY)
Admission: AD | Admit: 2011-03-11 | Discharge: 2011-03-11 | Disposition: A | Discharge status: Home / Self Care | Payer: Medicaid Other | Source: Ambulatory Visit | Attending: Obstetrics & Gynecology | Admitting: Obstetrics & Gynecology

## 2011-03-11 DIAGNOSIS — A6000 Herpesviral infection of urogenital system, unspecified: Secondary | ICD-10-CM | POA: Insufficient documentation

## 2011-03-11 DIAGNOSIS — O98519 Other viral diseases complicating pregnancy, unspecified trimester: Secondary | ICD-10-CM | POA: Insufficient documentation

## 2011-03-11 DIAGNOSIS — B009 Herpesviral infection, unspecified: Secondary | ICD-10-CM

## 2011-03-11 DIAGNOSIS — N949 Unspecified condition associated with female genital organs and menstrual cycle: Secondary | ICD-10-CM | POA: Insufficient documentation

## 2011-03-11 LAB — URINALYSIS, ROUTINE W REFLEX MICROSCOPIC
Glucose, UA: NEGATIVE mg/dL
Ketones, ur: NEGATIVE mg/dL
Leukocytes, UA: NEGATIVE
pH: 6 (ref 5.0–8.0)

## 2011-03-11 LAB — WET PREP, GENITAL
Clue Cells Wet Prep HPF POC: NONE SEEN
Yeast Wet Prep HPF POC: NONE SEEN

## 2011-03-11 NOTE — ED Provider Notes (Signed)
Chief Complaint:  Vaginal Discharge and Abdominal Pain   Robin Horn is  25 y.o. [id number] at [redacted]w[redacted]d presents complaining of Vaginal Discharge and Abdominal Pain . She began having vaginal pain/irritation a few days ago.   Obstetrical/Gynecological History: Menstrual History: OB History    Grav Para Term Preterm Abortions TAB SAB Ect Mult Living   7 3 3  0 3 3 0 0 0 3      Patient's last menstrual period was 11/14/2010.     Past Medical History: Past Medical History  Diagnosis Date  . Abnormal Pap smear of cervix   . Gonorrhea   . Chlamydia   . Herpes     Past Surgical History: Past Surgical History  Procedure Date  . Colposcopy     Family History: No family history on file.  Social History: History  Substance Use Topics  . Smoking status: Current Everyday Smoker -- 0.5 packs/day  . Smokeless tobacco: Never Used  . Alcohol Use: No    Allergies: No Active Allergies  Meds:  Prescriptions prior to admission  Medication Sig Dispense Refill  . prenatal vitamin w/FE, FA (PRENATAL 1 + 1) 27-1 MG TABS Take 1 tablet by mouth daily.        . valACYclovir (VALTREX) 500 MG tablet Take 250 mg by mouth daily. Patient takes one half of the 500mg  Valtrex daily to equal 250mg          Review of Systems - Please refer to the aforementioned patients' reports.     Physical Exam  Blood pressure 125/83, pulse 95, temperature 98.6 F (37 C), resp. rate 16, height 5\' 3"  (1.6 m), weight 59.875 kg (132 lb), last menstrual period 11/14/2010. GENERAL: Well-developed, well-nourished female in no acute distress.  LUNGS: Clear to auscultation bilaterally.  HEART: Regular rate and rhythm. ABDOMEN: Soft, nontender, nondistended, gravid.  EXTREMITIES: Nontender, no edema, 2+ distal pulses. FHR confirmed. SSE:  Normal appearing discharge Vulva:  3 HSV lesion on right labia minora Labs: Recent Results (from the past 24 hour(s))  URINALYSIS, ROUTINE W REFLEX MICROSCOPIC   Collection  Time   03/11/11  1:29 PM      Component Value Range   Color, Urine YELLOW  YELLOW    Appearance CLEAR  CLEAR    Specific Gravity, Urine 1.025  1.005 - 1.030    pH 6.0  5.0 - 8.0    Glucose, UA NEGATIVE  NEGATIVE (mg/dL)   Hgb urine dipstick NEGATIVE  NEGATIVE    Bilirubin Urine NEGATIVE  NEGATIVE    Ketones, ur NEGATIVE  NEGATIVE (mg/dL)   Protein, ur NEGATIVE  NEGATIVE (mg/dL)   Urobilinogen, UA 0.2  0.0 - 1.0 (mg/dL)   Nitrite NEGATIVE  NEGATIVE    Leukocytes, UA NEGATIVE  NEGATIVE   WET PREP, GENITAL   Collection Time   03/11/11  1:55 PM      Component Value Range   Yeast, Wet Prep NONE SEEN  NONE SEEN    Trich, Wet Prep NONE SEEN  NONE SEEN    Clue Cells, Wet Prep NONE SEEN  NONE SEEN    WBC, Wet Prep HPF POC MODERATE (*) NONE SEEN   GC/CHL negative 10/26,  No intercourse since then Imaging Studies:  No results found.  Assessment: Robin Horn is  25 y.o. [id number] at [redacted]w[redacted]d presents with HSV 2 outbreak  Plan: Pt has valtrex at home. WIl take 500mg  TID for 3 days  CRESENZO-DISHMAN,Roel Douthat 11/10/20122:27 PM

## 2011-03-11 NOTE — Progress Notes (Signed)
Patient reports having vaginal itching and discharge for a few days with abdominal pain some burning with urination  17 weeks.

## 2011-03-31 LAB — HEPATITIS B SURFACE ANTIGEN: Hepatitis B Surface Ag: NEGATIVE

## 2011-03-31 LAB — RUBELLA ANTIBODY, IGM: Rubella: IMMUNE

## 2011-03-31 LAB — ABO/RH: RH Type: POSITIVE

## 2011-03-31 LAB — RPR: RPR: NONREACTIVE

## 2011-04-09 ENCOUNTER — Emergency Department (HOSPITAL_COMMUNITY)
Admission: EM | Admit: 2011-04-09 | Discharge: 2011-04-09 | Disposition: A | Discharge status: Home / Self Care | Payer: Medicaid Other | Attending: Emergency Medicine | Admitting: Emergency Medicine

## 2011-04-09 ENCOUNTER — Encounter (HOSPITAL_COMMUNITY): Payer: Self-pay

## 2011-04-09 DIAGNOSIS — J111 Influenza due to unidentified influenza virus with other respiratory manifestations: Secondary | ICD-10-CM | POA: Insufficient documentation

## 2011-04-09 DIAGNOSIS — O99891 Other specified diseases and conditions complicating pregnancy: Secondary | ICD-10-CM | POA: Insufficient documentation

## 2011-04-09 DIAGNOSIS — F17200 Nicotine dependence, unspecified, uncomplicated: Secondary | ICD-10-CM | POA: Insufficient documentation

## 2011-04-09 MED ORDER — OSELTAMIVIR PHOSPHATE 75 MG PO CAPS: 75.0000 mg | ORAL_CAPSULE | Freq: Once | ORAL | Status: DC

## 2011-04-09 MED ORDER — OSELTAMIVIR PHOSPHATE 75 MG PO CAPS: 75.0000 mg | ORAL_CAPSULE | Freq: Two times a day (BID) | ORAL | Status: AC

## 2011-04-09 NOTE — ED Provider Notes (Addendum)
History     CSN: [id number] Arrival date & time: 04/09/2011  7:39 PM   None     Chief Complaint  Patient presents with  . URI    (Consider location/radiation/quality/duration/timing/severity/associated sxs/prior treatment) HPI This is a 25 year old black female who is pregnant. She has a two-day history of fever, body aches, cough, chest congestion, throat scratchiness, vomiting and diarrhea. She's not sure if the vomiting is due to the flu or to morning sickness. The symptoms worsened today. She has been taking ibuprofen with some relief. She's not sure how high her fever has been. She's had a decreased appetite. She denies nasal congestion, ear pain or abdominal pain.  Past Medical History  Diagnosis Date  . Abnormal Pap smear of cervix   . Gonorrhea   . Chlamydia   . Herpes   . Pregnant state, incidental     Past Surgical History  Procedure Date  . Colposcopy     No family history on file.  History  Substance Use Topics  . Smoking status: Current Everyday Smoker -- 0.5 packs/day  . Smokeless tobacco: Never Used  . Alcohol Use: No    OB History    Grav Para Term Preterm Abortions TAB SAB Ect Mult Living   7 3 3  0 3 3 0 0 0 3      Review of Systems  All other systems reviewed and are negative.    Allergies  Review of patient's allergies indicates no known allergies.  Home Medications   Current Outpatient Rx  Name Route Sig Dispense Refill  . PRENATAL PLUS 27-1 MG PO TABS Oral Take 1 tablet by mouth daily.      Marland Kitchen VALACYCLOVIR HCL 500 MG PO TABS Oral Take 250 mg by mouth daily. Patient takes one half of the 500mg  Valtrex daily to equal 250mg        BP 113/67  Pulse 116  Temp(Src) 98.8 F (37.1 C) (Oral)  Resp 20  SpO2 100%  LMP 11/14/2010  Physical Exam General: Well-developed, well-nourished female in no acute distress; appearance consistent with age of record HENT: normocephalic, atraumatic; no erythema of tympanic membranes; oral mucosa moist;  no pharyngeal exudate or erythema Eyes: pupils equal round and reactive to light; extraocular muscles intact Neck: supple Heart: regular rate and rhythm Lungs: clear to auscultation bilaterally Abdomen: soft; nontender; gravid, consistent with a Extremities: No deformity; full range of motion Neurologic: Awake, alert and oriented;motor function intact in all extremities and symmetric; no facial droop Skin: Warm and dry Psychiatric: Normal mood and affect    ED Course  Procedures (including critical care time)    MDM  We'll treat with Tamiflu as patient is within the 72 hour window and pregnancy is an indication for Tamiflu.        Hanley Seamen, MD 04/09/11 2314   Hanley Seamen, MD 04/10/11 1478

## 2011-04-09 NOTE — ED Notes (Signed)
MD at bedside. 

## 2011-04-09 NOTE — ED Notes (Signed)
Denies N/V/D - pt is pregnant due date 08/21/2011

## 2011-05-02 NOTE — L&D Delivery Note (Signed)
Delivery Note At  a viable female was delivered via  (Presentation: ;  ).  APGAR: , ; weight .   Placenta status: , .  Cord:  with the following complications: .  Cord pH: not done  Anesthesia: Epidural  Episiotomy:  Lacerations:  Suture Repair: 2.0 Est. Blood Loss (mL):   Mom to postpartum.  Baby to nursery-stable.  Robin Horn A 08/05/2011, 12:01 PM

## 2011-07-14 ENCOUNTER — Inpatient Hospital Stay (HOSPITAL_COMMUNITY)
Admission: AD | Admit: 2011-07-14 | Discharge: 2011-07-14 | Disposition: A | Discharge status: Home / Self Care | Payer: Medicaid Other | Source: Ambulatory Visit | Attending: Obstetrics | Admitting: Obstetrics

## 2011-07-14 ENCOUNTER — Encounter (HOSPITAL_COMMUNITY): Payer: Self-pay | Admitting: *Deleted

## 2011-07-14 DIAGNOSIS — M545 Low back pain, unspecified: Secondary | ICD-10-CM | POA: Insufficient documentation

## 2011-07-14 DIAGNOSIS — O99891 Other specified diseases and conditions complicating pregnancy: Secondary | ICD-10-CM | POA: Insufficient documentation

## 2011-07-14 DIAGNOSIS — N949 Unspecified condition associated with female genital organs and menstrual cycle: Secondary | ICD-10-CM

## 2011-07-14 DIAGNOSIS — R109 Unspecified abdominal pain: Secondary | ICD-10-CM | POA: Insufficient documentation

## 2011-07-14 LAB — URINALYSIS, ROUTINE W REFLEX MICROSCOPIC
Bilirubin Urine: NEGATIVE
Ketones, ur: NEGATIVE mg/dL
Leukocytes, UA: NEGATIVE
Nitrite: NEGATIVE
Protein, ur: NEGATIVE mg/dL
Urobilinogen, UA: 0.2 mg/dL (ref 0.0–1.0)
pH: 6 (ref 5.0–8.0)

## 2011-07-14 LAB — WET PREP, GENITAL: Yeast Wet Prep HPF POC: NONE SEEN

## 2011-07-14 NOTE — Discharge Instructions (Signed)
Drink lots of fluids, enough so your urine looks clear

## 2011-07-14 NOTE — MAU Provider Note (Signed)
Chief Complaint  Patient presents with  . Abdominal Pain    S: Robin Horn is a 26 y.o. [phone] at [redacted]w[redacted]d weeks presenting with 1 day hx of sharp crampy pain in rt and left lower abdomen and groin. The pain is exacerbated by walking and changing positions and better with rest. No dysuria, but does have urgency and frequency. Also with vulvar/vag itch. Denies contractions, vaginal bleeding or leakage of fluid. Fetus is active.  ROS: Negative except as noted above.  O: Filed Vitals:   07/14/11 1125  BP: 131/81  Pulse: 83  Temp:   Resp:     Gen: NAD Abd: soft, mildly tender in lower abd and groin Pelvic: NEFG, BUS neg             Spec: thick white d/c              Cx: post, soft, FT, thick high FHR: 130baseline;  Moderate variability, Accelerations present, Decelerations none UCs: rare mild  Results for orders placed during the hospital encounter of 07/14/11 (from the past 24 hour(s))  URINALYSIS, ROUTINE W REFLEX MICROSCOPIC     Status: Abnormal   Collection Time   07/14/11 10:53 AM      Component Value Range   Color, Urine YELLOW  YELLOW    APPearance CLEAR  CLEAR    Specific Gravity, Urine 1.010  1.005 - 1.030    pH 6.0  5.0 - 8.0    Glucose, UA NEGATIVE  NEGATIVE (mg/dL)   Hgb urine dipstick LARGE (*) NEGATIVE    Bilirubin Urine NEGATIVE  NEGATIVE    Ketones, ur NEGATIVE  NEGATIVE (mg/dL)   Protein, ur NEGATIVE  NEGATIVE (mg/dL)   Urobilinogen, UA 0.2  0.0 - 1.0 (mg/dL)   Nitrite NEGATIVE  NEGATIVE    Leukocytes, UA NEGATIVE  NEGATIVE   URINE MICROSCOPIC-ADD ON     Status: Abnormal   Collection Time   07/14/11 10:53 AM      Component Value Range   Squamous Epithelial / LPF FEW (*) RARE    RBC / HPF 11-20  <3 (RBC/hpf)    A: Round Ligament Pain in multip third tri Cat 1 FHR Microscopic hematuria  P: Urine C&S  Reassurance given and general relief measures reviewed: avoidance of precipitating movements, instructions on abdominal tightening/pelvic rock  exercises, abdominal binder, rest with hip flexion. Work excuse for today.

## 2011-07-14 NOTE — MAU Note (Signed)
Began having lower abdominal pain going around to lower back around 0930. Not sure if it is contractions. Was at work. States pain continued to worsen, making her nauseated. Left work and came to hospital. States pain is not as bad now.

## 2011-07-17 LAB — URINE CULTURE: Special Requests: NORMAL

## 2011-08-04 ENCOUNTER — Encounter (HOSPITAL_COMMUNITY): Payer: Self-pay | Admitting: Obstetrics and Gynecology

## 2011-08-04 ENCOUNTER — Inpatient Hospital Stay (HOSPITAL_COMMUNITY)
Admission: AD | Admit: 2011-08-04 | Discharge: 2011-08-08 | DRG: 775 | Disposition: A | Discharge status: Home / Self Care | Payer: Medicaid Other | Source: Ambulatory Visit | Attending: Obstetrics | Admitting: Obstetrics

## 2011-08-04 DIAGNOSIS — O1490 Unspecified pre-eclampsia, unspecified trimester: Secondary | ICD-10-CM

## 2011-08-04 DIAGNOSIS — Z22330 Carrier of Group B streptococcus: Secondary | ICD-10-CM

## 2011-08-04 DIAGNOSIS — O99892 Other specified diseases and conditions complicating childbirth: Principal | ICD-10-CM | POA: Diagnosis present

## 2011-08-04 HISTORY — DX: Migraine, unspecified, not intractable, without status migrainosus: G43.909

## 2011-08-04 HISTORY — DX: Gestational (pregnancy-induced) hypertension without significant proteinuria, unspecified trimester: O13.9

## 2011-08-04 LAB — CBC
MCH: 27.2 pg (ref 26.0–34.0)
MCV: 85 fL (ref 78.0–100.0)
Platelets: 193 10*3/uL (ref 150–400)
RBC: 3.86 MIL/uL — ABNORMAL LOW (ref 3.87–5.11)
RDW: 14.4 % (ref 11.5–15.5)

## 2011-08-04 LAB — URINE MICROSCOPIC-ADD ON

## 2011-08-04 LAB — URINALYSIS, ROUTINE W REFLEX MICROSCOPIC
Nitrite: NEGATIVE
Specific Gravity, Urine: 1.025 (ref 1.005–1.030)
pH: 6 (ref 5.0–8.0)

## 2011-08-04 MED ORDER — OXYCODONE-ACETAMINOPHEN 5-325 MG PO TABS
2.0000 | ORAL_TABLET | Freq: Once | ORAL | Status: AC
  Administered 2011-08-05: 2 via ORAL
  Filled 2011-08-04: qty 2

## 2011-08-04 NOTE — MAU Note (Signed)
"  I was taking 3 Tylenol PMs every 2 hours.  I called Dr. Elsie Stain nurse and she went off on me for taking that much Tylenol.  I get migraine headaches everyday, but they get real bad at nighttime.  I am out of my Tylenol #3 Rx. (+) FM.  No bleeding.  I leaked some watery white stuff earlier today, but no more since then."

## 2011-08-04 NOTE — MAU Note (Signed)
Pt states she has had a migraine off and on during the whole preg.-the Tylenol #3 is not helping anymore-states she wants to make sure she's not labor as well-aslo states she has a herpes outbreak

## 2011-08-04 NOTE — MAU Provider Note (Signed)
History     CSN: [id number]  Arrival date and time: 08/04/11 2222   First Provider Initiated Contact with Patient 08/04/11 2321      Chief Complaint  Patient presents with  . Migraine   HPI 26 y.o. [id number] at [redacted]w[redacted]d with headache, ongoing throughout pregnancy, worse x last 2 weeks, especially over the past 2 days. No relief with tylenol PM or tylenol #3. + seeing spots. No RUQ pain. + irregular contractions. States she had some watery/mucousy discharge earlier today, but none since, no bleeding. Also states that she has itching in her vaginal area and believes she is having a herpes outbreak. + fetal movement.    Past Medical History  Diagnosis Date  . Abnormal Pap smear of cervix   . Gonorrhea   . Chlamydia   . Herpes   . Pregnant state, incidental   . Migraines     Past Surgical History  Procedure Date  . Colposcopy   . No past surgeries     History reviewed. No pertinent family history.  History  Substance Use Topics  . Smoking status: Current Everyday Smoker -- 0.5 packs/day    Types: Cigarettes  . Smokeless tobacco: Never Used  . Alcohol Use: No    Allergies: No Known Allergies  Prescriptions prior to admission  Medication Sig Dispense Refill  . acetaminophen (TYLENOL) 325 MG tablet Take 650 mg by mouth every 6 (six) hours as needed. For pain      . acetaminophen-codeine (TYLENOL #3) 300-30 MG per tablet Take 2 tablets by mouth every 6 (six) hours as needed. Patient takes Tylenol #3 for migraines.      . ranitidine (ZANTAC) 150 MG tablet Take 150 mg by mouth at bedtime.      . valACYclovir (VALTREX) 500 MG tablet Take 250 mg by mouth daily. Patient takes one half of the 500mg  Valtrex daily to equal 250mg        . prenatal vitamin w/FE, FA (PRENATAL 1 + 1) 27-1 MG TABS Take 1 tablet by mouth daily.         Review of Systems  Constitutional: Negative.   Eyes: Positive for blurred vision.  Respiratory: Negative.   Cardiovascular: Negative.     Gastrointestinal: Negative for nausea, vomiting, abdominal pain, diarrhea and constipation.  Genitourinary: Negative for dysuria, urgency, frequency, hematuria and flank pain.       Negative for vaginal bleeding, Positive for cramping/contractions  Musculoskeletal: Negative.   Neurological: Positive for headaches.  Psychiatric/Behavioral: Negative.    Physical Exam   Blood pressure 126/78, pulse 88, temperature 98.2 F (36.8 C), temperature source Oral, resp. rate 20, height 5\' 3"  (1.6 m), weight 172 lb (78.019 kg), last menstrual period 11/14/2010, SpO2 99.00%.  Physical Exam  Nursing note and vitals reviewed. Constitutional: She is oriented to person, place, and time. She appears well-developed and well-nourished. No distress.  Cardiovascular: Normal rate and regular rhythm.   Respiratory: Effort normal. No respiratory distress.  GI: Soft. There is no tenderness.  Genitourinary: There is no tenderness or lesion on the right labia. There is no tenderness or lesion on the left labia. No bleeding around the vagina. Vaginal discharge (white, copius) found.       SVE: 2/very posterior/-2 No evidence of any active HSV lesions  Musculoskeletal: Normal range of motion.  Neurological: She is alert and oriented to person, place, and time. She has normal reflexes.  Skin: Skin is warm and dry.  Psychiatric: She has a normal mood and affect.  Filed Vitals:   08/04/11 2331 08/04/11 2346 08/05/11 0002 08/05/11 0017  BP: 159/102 143/101 169/98 126/78  Pulse: 96 101 85 88  Temp:      TempSrc:      Resp:      Height:      Weight:      SpO2:        MAU Course  Procedures  Results for orders placed during the hospital encounter of 08/04/11 (from the past 24 hour(s))  URINALYSIS, ROUTINE W REFLEX MICROSCOPIC     Status: Abnormal   Collection Time   08/04/11 10:42 PM      Component Value Range   Color, Urine YELLOW  YELLOW    APPearance CLOUDY (*) CLEAR    Specific Gravity, Urine 1.025   1.005 - 1.030    pH 6.0  5.0 - 8.0    Glucose, UA NEGATIVE  NEGATIVE (mg/dL)   Hgb urine dipstick SMALL (*) NEGATIVE    Bilirubin Urine NEGATIVE  NEGATIVE    Ketones, ur NEGATIVE  NEGATIVE (mg/dL)   Protein, ur >478 (*) NEGATIVE (mg/dL)   Urobilinogen, UA 0.2  0.0 - 1.0 (mg/dL)   Nitrite NEGATIVE  NEGATIVE    Leukocytes, UA TRACE (*) NEGATIVE   URINE MICROSCOPIC-ADD ON     Status: Abnormal   Collection Time   08/04/11 10:42 PM      Component Value Range   Squamous Epithelial / LPF MANY (*) RARE    WBC, UA 21-50  <3 (WBC/hpf)   RBC / HPF 3-6  <3 (RBC/hpf)   Bacteria, UA MANY (*) RARE    Urine-Other RARE YEAST    CBC     Status: Abnormal   Collection Time   08/04/11 11:30 PM      Component Value Range   WBC 11.5 (*) 4.0 - 10.5 (K/uL)   RBC 3.86 (*) 3.87 - 5.11 (MIL/uL)   Hemoglobin 10.5 (*) 12.0 - 15.0 (g/dL)   HCT 29.5 (*) 62.1 - 46.0 (%)   MCV 85.0  78.0 - 100.0 (fL)   MCH 27.2  26.0 - 34.0 (pg)   MCHC 32.0  30.0 - 36.0 (g/dL)   RDW 30.8  65.7 - 84.6 (%)   Platelets 193  150 - 400 (K/uL)  COMPREHENSIVE METABOLIC PANEL     Status: Abnormal   Collection Time   08/04/11 11:30 PM      Component Value Range   Sodium 135  135 - 145 (mEq/L)   Potassium 3.4 (*) 3.5 - 5.1 (mEq/L)   Chloride 100  96 - 112 (mEq/L)   CO2 20  19 - 32 (mEq/L)   Glucose, Bld 119 (*) 70 - 99 (mg/dL)   BUN 11  6 - 23 (mg/dL)   Creatinine, Ser 9.62  0.50 - 1.10 (mg/dL)   Calcium 9.2  8.4 - 95.2 (mg/dL)   Total Protein 5.9 (*) 6.0 - 8.3 (g/dL)   Albumin 2.2 (*) 3.5 - 5.2 (g/dL)   AST 17  0 - 37 (U/L)   ALT 8  0 - 35 (U/L)   Alkaline Phosphatase 129 (*) 39 - 117 (U/L)   Total Bilirubin 0.1 (*) 0.3 - 1.2 (mg/dL)   GFR calc non Af Amer >90  >90 (mL/min)   GFR calc Af Amer >90  >90 (mL/min)  URIC ACID     Status: Normal   Collection Time   08/04/11 11:30 PM      Component Value Range   Uric Acid, Serum 7.0  2.4 - 7.0 (mg/dL)  LACTATE DEHYDROGENASE     Status: Normal   Collection Time   08/04/11 11:30 PM       Component Value Range   LDH 219  94 - 250 (U/L)  WET PREP, GENITAL     Status: Abnormal   Collection Time   08/05/11 12:03 AM      Component Value Range   Yeast Wet Prep HPF POC NONE SEEN  NONE SEEN    Trich, Wet Prep NONE SEEN  NONE SEEN    Clue Cells Wet Prep HPF POC NONE SEEN  NONE SEEN    WBC, Wet Prep HPF POC MANY (*) NONE SEEN    Fern negative  Assessment and Plan  26 y.o. [id number] at [redacted]w[redacted]d  Preeclampsia Admit for Magnesium sulfate and IOL per Dr. Mortimer Fries 08/05/2011, 12:25 AM

## 2011-08-05 ENCOUNTER — Encounter (HOSPITAL_COMMUNITY): Payer: Self-pay | Admitting: Anesthesiology

## 2011-08-05 ENCOUNTER — Encounter (HOSPITAL_COMMUNITY): Payer: Self-pay

## 2011-08-05 ENCOUNTER — Encounter (HOSPITAL_COMMUNITY): Payer: Self-pay | Admitting: *Deleted

## 2011-08-05 ENCOUNTER — Inpatient Hospital Stay (HOSPITAL_COMMUNITY): Payer: Medicaid Other | Admitting: Anesthesiology

## 2011-08-05 LAB — URIC ACID: Uric Acid, Serum: 7 mg/dL (ref 2.4–7.0)

## 2011-08-05 LAB — COMPREHENSIVE METABOLIC PANEL
AST: 17 U/L (ref 0–37)
BUN: 11 mg/dL (ref 6–23)
CO2: 20 mEq/L (ref 19–32)
Calcium: 9.2 mg/dL (ref 8.4–10.5)
Creatinine, Ser: 0.69 mg/dL (ref 0.50–1.10)
GFR calc Af Amer: >90 mL/min (ref >90)
GFR calc non Af Amer: >90 mL/min (ref >90)

## 2011-08-05 LAB — WET PREP, GENITAL: Yeast Wet Prep HPF POC: NONE SEEN

## 2011-08-05 LAB — LACTATE DEHYDROGENASE: LDH: 219 U/L (ref 94–250)

## 2011-08-05 LAB — MRSA PCR SCREENING: MRSA by PCR: NEGATIVE

## 2011-08-05 MED ORDER — LABETALOL HCL 200 MG PO TABS
200.0000 mg | ORAL_TABLET | Freq: Three times a day (TID) | ORAL | Status: DC
  Administered 2011-08-05 – 2011-08-07 (×5): 200 mg via ORAL
  Filled 2011-08-05 (×6): qty 1

## 2011-08-05 MED ORDER — PENICILLIN G POTASSIUM 5000000 UNITS IJ SOLR
5.0000 10*6.[IU] | Freq: Once | INTRAMUSCULAR | Status: AC
  Administered 2011-08-05: 5 10*6.[IU] via INTRAVENOUS
  Filled 2011-08-05: qty 5

## 2011-08-05 MED ORDER — PHENYLEPHRINE 40 MCG/ML (10ML) SYRINGE FOR IV PUSH (FOR BLOOD PRESSURE SUPPORT): 80.0000 ug | PREFILLED_SYRINGE | INTRAVENOUS | Status: DC | PRN

## 2011-08-05 MED ORDER — LIDOCAINE HCL (PF) 1 % IJ SOLN: 30.0000 mL | INTRAMUSCULAR | Status: DC | PRN

## 2011-08-05 MED ORDER — EPHEDRINE 5 MG/ML INJ
10.0000 mg | INTRAVENOUS | Status: DC | PRN
  Filled 2011-08-05: qty 4

## 2011-08-05 MED ORDER — EPHEDRINE 5 MG/ML INJ: 10.0000 mg | INTRAVENOUS | Status: DC | PRN

## 2011-08-05 MED ORDER — NICOTINE 14 MG/24HR TD PT24
14.0000 mg | MEDICATED_PATCH | Freq: Every day | TRANSDERMAL | Status: DC
  Administered 2011-08-05 – 2011-08-08 (×4): 14 mg via TRANSDERMAL
  Filled 2011-08-05 (×4): qty 1

## 2011-08-05 MED ORDER — FERROUS SULFATE 325 (65 FE) MG PO TABS
325.0000 mg | ORAL_TABLET | Freq: Two times a day (BID) | ORAL | Status: DC
  Administered 2011-08-05 – 2011-08-08 (×6): 325 mg via ORAL
  Filled 2011-08-05 (×6): qty 1

## 2011-08-05 MED ORDER — ZOLPIDEM TARTRATE 5 MG PO TABS
5.0000 mg | ORAL_TABLET | Freq: Every evening | ORAL | Status: DC | PRN
  Administered 2011-08-06 – 2011-08-07 (×2): 5 mg via ORAL
  Filled 2011-08-05 (×2): qty 1

## 2011-08-05 MED ORDER — OXYCODONE-ACETAMINOPHEN 5-325 MG PO TABS
1.0000 | ORAL_TABLET | ORAL | Status: DC | PRN
  Administered 2011-08-05: 1 via ORAL
  Administered 2011-08-06 – 2011-08-07 (×2): 2 via ORAL
  Administered 2011-08-07 (×2): 1 via ORAL
  Filled 2011-08-05 (×2): qty 1
  Filled 2011-08-05: qty 2
  Filled 2011-08-05: qty 1
  Filled 2011-08-05: qty 2

## 2011-08-05 MED ORDER — ONDANSETRON HCL 4 MG/2ML IJ SOLN
4.0000 mg | Freq: Four times a day (QID) | INTRAMUSCULAR | Status: DC | PRN
  Administered 2011-08-05: 4 mg via INTRAVENOUS
  Filled 2011-08-05: qty 2

## 2011-08-05 MED ORDER — FLEET ENEMA 7-19 GM/118ML RE ENEM: 1.0000 | ENEMA | RECTAL | Status: DC | PRN

## 2011-08-05 MED ORDER — LACTATED RINGERS IV SOLN
500.0000 mL | Freq: Once | INTRAVENOUS | Status: AC
  Administered 2011-08-05: 500 mL via INTRAVENOUS

## 2011-08-05 MED ORDER — BENZOCAINE-MENTHOL 20-0.5 % EX AERO: 1.0000 "application " | INHALATION_SPRAY | CUTANEOUS | Status: DC | PRN

## 2011-08-05 MED ORDER — CITRIC ACID-SODIUM CITRATE 334-500 MG/5ML PO SOLN: 30.0000 mL | ORAL | Status: DC | PRN

## 2011-08-05 MED ORDER — IBUPROFEN 600 MG PO TABS
600.0000 mg | ORAL_TABLET | Freq: Four times a day (QID) | ORAL | Status: DC
  Administered 2011-08-05 – 2011-08-08 (×11): 600 mg via ORAL
  Filled 2011-08-05 (×11): qty 1

## 2011-08-05 MED ORDER — LACTATED RINGERS IV SOLN
INTRAVENOUS | Status: DC
  Administered 2011-08-05 – 2011-08-07 (×4): via INTRAVENOUS

## 2011-08-05 MED ORDER — DIPHENHYDRAMINE HCL 25 MG PO CAPS: 25.0000 mg | ORAL_CAPSULE | Freq: Four times a day (QID) | ORAL | Status: DC | PRN

## 2011-08-05 MED ORDER — LIDOCAINE HCL (PF) 1 % IJ SOLN
INTRAMUSCULAR | Status: DC | PRN
  Administered 2011-08-05 (×2): 8 mL

## 2011-08-05 MED ORDER — PRENATAL MULTIVITAMIN CH
1.0000 | ORAL_TABLET | Freq: Every day | ORAL | Status: DC
  Administered 2011-08-06 – 2011-08-08 (×3): 1 via ORAL
  Filled 2011-08-05 (×3): qty 1

## 2011-08-05 MED ORDER — PHENYLEPHRINE 40 MCG/ML (10ML) SYRINGE FOR IV PUSH (FOR BLOOD PRESSURE SUPPORT)
80.0000 ug | PREFILLED_SYRINGE | INTRAVENOUS | Status: DC | PRN
  Filled 2011-08-05: qty 5

## 2011-08-05 MED ORDER — MAGNESIUM SULFATE 40 G IN LACTATED RINGERS - SIMPLE
2.0000 g/h | INTRAVENOUS | Status: DC
  Administered 2011-08-05 – 2011-08-06 (×2): 2 g/h via INTRAVENOUS
  Filled 2011-08-05 (×2): qty 500

## 2011-08-05 MED ORDER — TERBUTALINE SULFATE 1 MG/ML IJ SOLN: 0.2500 mg | Freq: Once | INTRAMUSCULAR | Status: DC | PRN

## 2011-08-05 MED ORDER — TETANUS-DIPHTH-ACELL PERTUSSIS 5-2.5-18.5 LF-MCG/0.5 IM SUSP
0.5000 mL | Freq: Once | INTRAMUSCULAR | Status: AC
  Administered 2011-08-06: 0.5 mL via INTRAMUSCULAR
  Filled 2011-08-05: qty 0.5

## 2011-08-05 MED ORDER — FENTANYL 2.5 MCG/ML BUPIVACAINE 1/10 % EPIDURAL INFUSION (WH - ANES)
14.0000 mL/h | INTRAMUSCULAR | Status: DC
  Administered 2011-08-05 (×2): 14 mL/h via EPIDURAL
  Filled 2011-08-05 (×3): qty 60

## 2011-08-05 MED ORDER — BUTORPHANOL TARTRATE 2 MG/ML IJ SOLN: 1.0000 mg | INTRAMUSCULAR | Status: DC | PRN

## 2011-08-05 MED ORDER — OXYTOCIN 20 UNITS IN LACTATED RINGERS INFUSION - SIMPLE
1.0000 m[IU]/min | INTRAVENOUS | Status: DC
  Administered 2011-08-05: 1 m[IU]/min via INTRAVENOUS
  Filled 2011-08-05: qty 1000

## 2011-08-05 MED ORDER — LACTATED RINGERS IV SOLN: 500.0000 mL | INTRAVENOUS | Status: DC | PRN

## 2011-08-05 MED ORDER — ONDANSETRON HCL 4 MG PO TABS
4.0000 mg | ORAL_TABLET | ORAL | Status: DC | PRN
  Administered 2011-08-05: 4 mg via ORAL
  Filled 2011-08-05: qty 1

## 2011-08-05 MED ORDER — ONDANSETRON HCL 4 MG/2ML IJ SOLN: 4.0000 mg | INTRAMUSCULAR | Status: DC | PRN

## 2011-08-05 MED ORDER — OXYCODONE-ACETAMINOPHEN 5-325 MG PO TABS: 1.0000 | ORAL_TABLET | ORAL | Status: DC | PRN

## 2011-08-05 MED ORDER — MAGNESIUM SULFATE 40 G IN LACTATED RINGERS - SIMPLE
2.0000 g/h | INTRAVENOUS | Status: DC
  Filled 2011-08-05: qty 500

## 2011-08-05 MED ORDER — WITCH HAZEL-GLYCERIN EX PADS: 1.0000 "application " | MEDICATED_PAD | CUTANEOUS | Status: DC | PRN

## 2011-08-05 MED ORDER — SENNOSIDES-DOCUSATE SODIUM 8.6-50 MG PO TABS
2.0000 | ORAL_TABLET | Freq: Every day | ORAL | Status: DC
  Administered 2011-08-05 – 2011-08-06 (×2): 2 via ORAL

## 2011-08-05 MED ORDER — IBUPROFEN 600 MG PO TABS: 600.0000 mg | ORAL_TABLET | Freq: Four times a day (QID) | ORAL | Status: DC | PRN

## 2011-08-05 MED ORDER — OXYTOCIN 20 UNITS IN LACTATED RINGERS INFUSION - SIMPLE: 125.0000 mL/h | Freq: Once | INTRAVENOUS | Status: DC

## 2011-08-05 MED ORDER — SIMETHICONE 80 MG PO CHEW: 80.0000 mg | CHEWABLE_TABLET | ORAL | Status: DC | PRN

## 2011-08-05 MED ORDER — FENTANYL 2.5 MCG/ML BUPIVACAINE 1/10 % EPIDURAL INFUSION (WH - ANES)
INTRAMUSCULAR | Status: DC | PRN
  Administered 2011-08-05: 14 mL/h via EPIDURAL

## 2011-08-05 MED ORDER — PENICILLIN G POTASSIUM 5000000 UNITS IJ SOLR
2.5000 10*6.[IU] | INTRAVENOUS | Status: DC
  Administered 2011-08-05: 2.5 10*6.[IU] via INTRAVENOUS
  Filled 2011-08-05 (×6): qty 2.5

## 2011-08-05 MED ORDER — LACTATED RINGERS IV SOLN
INTRAVENOUS | Status: DC
  Administered 2011-08-05: 01:00:00 via INTRAVENOUS

## 2011-08-05 MED ORDER — DIBUCAINE 1 % RE OINT: 1.0000 "application " | TOPICAL_OINTMENT | RECTAL | Status: DC | PRN

## 2011-08-05 MED ORDER — LANOLIN HYDROUS EX OINT: TOPICAL_OINTMENT | CUTANEOUS | Status: DC | PRN

## 2011-08-05 MED ORDER — DIPHENHYDRAMINE HCL 50 MG/ML IJ SOLN: 12.5000 mg | INTRAMUSCULAR | Status: DC | PRN

## 2011-08-05 MED ORDER — MAGNESIUM SULFATE BOLUS VIA INFUSION
4.0000 g | Freq: Once | INTRAVENOUS | Status: AC
  Administered 2011-08-05: 4 g via INTRAVENOUS
  Filled 2011-08-05: qty 500

## 2011-08-05 MED ORDER — OXYTOCIN BOLUS FROM INFUSION
500.0000 mL | Freq: Once | INTRAVENOUS | Status: DC
Start: 2011-08-05 — End: 2011-08-05
  Filled 2011-08-05: qty 500

## 2011-08-05 MED ORDER — ACETAMINOPHEN 325 MG PO TABS
650.0000 mg | ORAL_TABLET | ORAL | Status: DC | PRN
  Administered 2011-08-05: 650 mg via ORAL
  Filled 2011-08-05: qty 2

## 2011-08-05 NOTE — Progress Notes (Signed)
Pt complete, will monitor q 5 min and document q 15 min.  Dr Gaynell Face notified.

## 2011-08-05 NOTE — Anesthesia Postprocedure Evaluation (Signed)
  Anesthesia Post-op Note  Patient: Robin Horn  Procedure(s) Performed: * No procedures listed *  Patient Location: PACU and A-ICU  Anesthesia Type: Epidural  Level of Consciousness: awake, alert  and oriented  Airway and Oxygen Therapy: Patient Spontanous Breathing   Post-op Assessment: Patient's Cardiovascular Status Stable and Respiratory Function Stable  Post-op Vital Signs: stable  Complications: No apparent anesthesia complications

## 2011-08-05 NOTE — H&P (Signed)
This is Dr. Francoise Ceo dictating the history and physical on  Robin Horn she's a 26 year old gravida 7 para 30 07/02/1934 weeks and 5 days due date 08/21/2011 positive GBS she came in with severe headache which has been her norm due to pregnancy she's been treated with Tylenol No. 3 Tylox she was also having irregular contractions and her diastolic blood pressure was above 103 she had greater than 3+ proteinuria so she was admitted and started on magnesium sulfate 4 g loading she also penicillin regiment for the GBS and started on Pitocin her cervix is now 3-4 cm 80% with the vertex at -1 station amniotomy was performed and the fluid was clear Past medical history negative Past surgical history negative Social history negative System negative Physical exam well-developed female in early labor Breasts negative Heart regular rhythm no murmurs no gallops Lungs clear Abdomen term Pelvic as described above plus no herpetic lesions seen in the peritoneal perineum she is on Valtrex 500 by mouth daily and has been taking her medication Extremities negative

## 2011-08-05 NOTE — Progress Notes (Signed)
Dr Gaynell Face notified of dilatation. Continue to labor down per Dr Gaynell Face.

## 2011-08-05 NOTE — Anesthesia Procedure Notes (Signed)
Epidural Patient location during procedure: OB Start time: 08/05/2011 4:04 AM End time: 08/05/2011 4:10 AM Reason for block: procedure for pain  Staffing Anesthesiologist: Sandrea Hughs Performed by: anesthesiologist   Preanesthetic Checklist Completed: patient identified, site marked, surgical consent, pre-op evaluation, timeout performed, IV checked, risks and benefits discussed and monitors and equipment checked  Epidural Patient position: sitting Prep: site prepped and draped and DuraPrep Patient monitoring: continuous pulse ox and blood pressure Approach: midline Injection technique: LOR air  Needle:  Needle type: Tuohy  Needle gauge: 17 G Needle length: 9 cm Needle insertion depth: 5 cm cm Catheter type: closed end flexible Catheter size: 19 Gauge Catheter at skin depth: 10 cm Test dose: negative and Other  Assessment Sensory level: T9 Events: blood not aspirated, injection not painful, no injection resistance, negative IV test and no paresthesia

## 2011-08-05 NOTE — Progress Notes (Signed)
SVD of female by Dr Gaynell Face.  See L&D summary and progress notes for events of delivery.

## 2011-08-05 NOTE — Anesthesia Preprocedure Evaluation (Signed)
Anesthesia Evaluation  Patient identified by MRN, date of birth, ID band Patient awake    Reviewed: Allergy & Precautions, H&P , NPO status , Patient's Chart, lab work & pertinent test results  Airway Mallampati: II TM Distance: >3 FB Neck ROM: full    Dental No notable dental hx.    Pulmonary neg pulmonary ROS,    Pulmonary exam normal       Cardiovascular hypertension, Pt. on medications     Neuro/Psych negative psych ROS   GI/Hepatic negative GI ROS, Neg liver ROS,   Endo/Other  negative endocrine ROS  Renal/GU negative Renal ROS  negative genitourinary   Musculoskeletal negative musculoskeletal ROS (+)   Abdominal Normal abdominal exam  (+)   Peds negative pediatric ROS (+)  Hematology negative hematology ROS (+)   Anesthesia Other Findings   Reproductive/Obstetrics (+) Pregnancy                           Anesthesia Physical Anesthesia Plan  ASA: II  Anesthesia Plan: Epidural   Post-op Pain Management:    Induction:   Airway Management Planned:   Additional Equipment:   Intra-op Plan:   Post-operative Plan:   Informed Consent: I have reviewed the patients History and Physical, chart, labs and discussed the procedure including the risks, benefits and alternatives for the proposed anesthesia with the patient or authorized representative who has indicated his/her understanding and acceptance.     Plan Discussed with:   Anesthesia Plan Comments:         Anesthesia Quick Evaluation

## 2011-08-06 LAB — CBC
HCT: 25.4 % — ABNORMAL LOW (ref 36.0–46.0)
Hemoglobin: 8.2 g/dL — ABNORMAL LOW (ref 12.0–15.0)
MCH: 27.8 pg (ref 26.0–34.0)
MCHC: 32.3 g/dL (ref 30.0–36.0)
MCV: 86.1 fL (ref 78.0–100.0)
Platelets: 169 10*3/uL (ref 150–400)
RBC: 2.95 MIL/uL — ABNORMAL LOW (ref 3.87–5.11)
RDW: 14.7 % (ref 11.5–15.5)
WBC: 12.5 10*3/uL — ABNORMAL HIGH (ref 4.0–10.5)

## 2011-08-06 NOTE — Progress Notes (Signed)
Patient ID: Robin Horn, female   DOB: [birth year], 26 y.o.   MRN: [id number] Postpartum day one Blood pressure 140 oh for 82 She is still on magnesium sulfate continued today Fundus firm Legs negative doing well

## 2011-08-07 MED ORDER — LABETALOL HCL 200 MG PO TABS
400.0000 mg | ORAL_TABLET | Freq: Three times a day (TID) | ORAL | Status: DC
  Administered 2011-08-07 – 2011-08-08 (×4): 400 mg via ORAL
  Filled 2011-08-07 (×4): qty 2

## 2011-08-07 MED ORDER — PNEUMOCOCCAL VAC POLYVALENT 25 MCG/0.5ML IJ INJ
0.5000 mL | INJECTION | Freq: Once | INTRAMUSCULAR | Status: AC
  Administered 2011-08-07: 0.5 mL via INTRAMUSCULAR
  Filled 2011-08-07: qty 0.5

## 2011-08-07 NOTE — Progress Notes (Signed)
UR chart review completed.  

## 2011-08-07 NOTE — Progress Notes (Signed)
Patient ID: Robin Horn, female   DOB: 03/16/1986, 26 y.o.   MRN: [id number] Postpartum day one Blood pressure 150/88 and has been in that region since delivered Magnesium sulfate discontinued this morning and her labetalol was increased from 200 mg every 8 hours to 400 mg every 8 hours starting this a.m. Fundus firm Lochia moderate No complaints

## 2011-08-07 NOTE — Progress Notes (Signed)
08/07/11 1015 Sbar report given to Vikki Ports, RN for pt transfer to [facility]. 1025 Pt transferred ambulatory to Rm #[id number].

## 2011-08-08 NOTE — Discharge Instructions (Signed)
Discharge instructions   You can wash your hair  Shower  Eat what you want  Drink what you want  See me in 6 weeks  Your ankles are going to swell more in the next 2 weeks than when pregnant  No sex for 6 weeks   Domenico Achord A, MD 08/08/2011    

## 2011-08-08 NOTE — Discharge Summary (Signed)
Obstetric Discharge Summary Reason for Admission: onset of labor Prenatal Procedures: none Intrapartum Procedures: spontaneous vaginal delivery Postpartum Procedures: none Complications-Operative and Postpartum: none Hemoglobin  Date Value Range Status  08/06/2011 8.2* 12.0-15.0 (g/dL) Final     DELTA CHECK NOTED     REPEATED TO VERIFY     HCT  Date Value Range Status  08/06/2011 25.4* 36.0-46.0 (%) Final    Physical Exam:  General: alert Lochia: appropriate Uterine Fundus: firm Incision: healing well DVT Evaluation: No evidence of DVT seen on physical exam.  Discharge Diagnoses: Term Pregnancy-delivered  Discharge Information: Date: 08/08/2011 Activity: pelvic rest Diet: routine Medications: Percocet Condition: stable Instructions: refer to practice specific booklet Discharge to: home Follow-up Information    Follow up with Aryka Coonradt A, MD. Call in 6 weeks.   Contact information:   [address] [place] [address] [phone]          Newborn Data: Live born female  Birth Weight: 6 lb 14.6 oz (3135 g) APGAR: 10, 10  Home with mother.  Briant Angelillo A 08/08/2011, 7:47 AM

## 2011-08-11 ENCOUNTER — Encounter (HOSPITAL_COMMUNITY): Payer: Self-pay

## 2011-08-11 ENCOUNTER — Inpatient Hospital Stay (HOSPITAL_COMMUNITY)
Admission: AD | Admit: 2011-08-11 | Discharge: 2011-08-11 | Disposition: A | Discharge status: Home / Self Care | Payer: Medicaid Other | Source: Ambulatory Visit | Attending: Obstetrics | Admitting: Obstetrics

## 2011-08-11 DIAGNOSIS — R42 Dizziness and giddiness: Secondary | ICD-10-CM | POA: Insufficient documentation

## 2011-08-11 DIAGNOSIS — O99893 Other specified diseases and conditions complicating puerperium: Secondary | ICD-10-CM | POA: Insufficient documentation

## 2011-08-11 DIAGNOSIS — R51 Headache: Secondary | ICD-10-CM | POA: Insufficient documentation

## 2011-08-11 DIAGNOSIS — O1490 Unspecified pre-eclampsia, unspecified trimester: Secondary | ICD-10-CM

## 2011-08-11 DIAGNOSIS — IMO0002 Reserved for concepts with insufficient information to code with codable children: Secondary | ICD-10-CM

## 2011-08-11 LAB — CBC
MCHC: 31.5 g/dL (ref 30.0–36.0)
MCV: 86.6 fL (ref 78.0–100.0)
Platelets: 275 10*3/uL (ref 150–400)
RDW: 15.3 % (ref 11.5–15.5)
WBC: 10.1 10*3/uL (ref 4.0–10.5)

## 2011-08-11 LAB — URINE MICROSCOPIC-ADD ON

## 2011-08-11 LAB — COMPREHENSIVE METABOLIC PANEL
AST: 25 U/L (ref 0–37)
Albumin: 2.4 g/dL — ABNORMAL LOW (ref 3.5–5.2)
Calcium: 9.5 mg/dL (ref 8.4–10.5)
Chloride: 101 mEq/L (ref 96–112)
Creatinine, Ser: 0.86 mg/dL (ref 0.50–1.10)
Total Bilirubin: 0.2 mg/dL — ABNORMAL LOW (ref 0.3–1.2)
Total Protein: 6.6 g/dL (ref 6.0–8.3)

## 2011-08-11 LAB — URINALYSIS, ROUTINE W REFLEX MICROSCOPIC
Glucose, UA: NEGATIVE mg/dL
Leukocytes, UA: NEGATIVE
Specific Gravity, Urine: 1.025 (ref 1.005–1.030)
pH: 7 (ref 5.0–8.0)

## 2011-08-11 LAB — URIC ACID: Uric Acid, Serum: 7.8 mg/dL — ABNORMAL HIGH (ref 2.4–7.0)

## 2011-08-11 LAB — LACTATE DEHYDROGENASE: LDH: 471 U/L — ABNORMAL HIGH (ref 94–250)

## 2011-08-11 NOTE — MAU Provider Note (Signed)
History   Pt presents today c/o HA and dizziness. She is 6 days pp from SVD. She was treated with Magnesium for preeclampsia and was sent home with labetalol 200mg  three times daily. She states her BP was elevated this am when Home Health nurse checked her. She denies CP, SOB, or any other sx at this time.  CSN: [id number]  Arrival date and time: 08/11/11 2057   First Provider Initiated Contact with Patient 08/11/11 2130      Chief Complaint  Patient presents with  . Hypertension  . Headache   HPI  OB History    Grav Para Term Preterm Abortions TAB SAB Ect Mult Living   7 4 4  0 3 3 0 0 0 4      Past Medical History  Diagnosis Date  . Abnormal Pap smear of cervix   . Gonorrhea   . Chlamydia   . Herpes   . Pregnant state, incidental   . Migraines   . Pregnancy induced hypertension     Past Surgical History  Procedure Date  . Colposcopy   . No past surgeries     History reviewed. No pertinent family history.  History  Substance Use Topics  . Smoking status: Current Everyday Smoker -- 0.5 packs/day    Types: Cigarettes  . Smokeless tobacco: Never Used  . Alcohol Use: No    Allergies: No Known Allergies  Prescriptions prior to admission  Medication Sig Dispense Refill  . labetalol (NORMODYNE) 200 MG tablet Take 400 mg by mouth 3 (three) times daily.      Marland Kitchen oxyCODONE-acetaminophen (PERCOCET) 5-325 MG per tablet Take 1 tablet by mouth every 4 (four) hours as needed. As needed for pain      . valACYclovir (VALTREX) 500 MG tablet Take 500 mg by mouth daily.        Review of Systems  Constitutional: Negative for fever and chills.  Eyes: Negative for blurred vision and double vision.  Respiratory: Negative for cough, hemoptysis, sputum production, shortness of breath and wheezing.   Cardiovascular: Negative for chest pain and palpitations.  Gastrointestinal: Negative for nausea, vomiting, abdominal pain, diarrhea and constipation.  Genitourinary: Negative for  dysuria, urgency, frequency and hematuria.  Neurological: Positive for headaches. Negative for dizziness.  Psychiatric/Behavioral: Negative for depression and suicidal ideas.   Physical Exam   Blood pressure 130/81, pulse 89, temperature 97.7 F (36.5 C), temperature source Oral, resp. rate 18, height 5\' 3"  (1.6 m), weight 154 lb 4 oz (69.967 kg), last menstrual period 11/14/2010, unknown if currently breastfeeding.  Physical Exam  Nursing note and vitals reviewed. Constitutional: She is oriented to person, place, and time. She appears well-developed and well-nourished. No distress.  HENT:  Head: Normocephalic and atraumatic.  Eyes: EOM are normal. Pupils are equal, round, and reactive to light.  Cardiovascular: Normal rate, regular rhythm and normal heart sounds.  Exam reveals no gallop and no friction rub.   No murmur heard. Respiratory: Effort normal and breath sounds normal. No respiratory distress. She has no wheezes. She has no rales. She exhibits no tenderness.  GI: Soft. She exhibits no distension and no mass. There is no tenderness. There is no rebound and no guarding.  Neurological: She is alert and oriented to person, place, and time.  Skin: Skin is warm and dry. She is not diaphoretic.  Psychiatric: She has a normal mood and affect. Her behavior is normal. Judgment and thought content normal.    MAU Course  Procedures  Results  for orders placed during the hospital encounter of 08/11/11 (from the past 24 hour(s))  URINALYSIS, ROUTINE W REFLEX MICROSCOPIC     Status: Abnormal   Collection Time   08/11/11  9:00 PM      Component Value Range   Color, Urine YELLOW  YELLOW    APPearance CLEAR  CLEAR    Specific Gravity, Urine 1.025  1.005 - 1.030    pH 7.0  5.0 - 8.0    Glucose, UA NEGATIVE  NEGATIVE (mg/dL)   Hgb urine dipstick LARGE (*) NEGATIVE    Bilirubin Urine NEGATIVE  NEGATIVE    Ketones, ur NEGATIVE  NEGATIVE (mg/dL)   Protein, ur 161 (*) NEGATIVE (mg/dL)    Urobilinogen, UA 0.2  0.0 - 1.0 (mg/dL)   Nitrite NEGATIVE  NEGATIVE    Leukocytes, UA NEGATIVE  NEGATIVE   URINE MICROSCOPIC-ADD ON     Status: Normal   Collection Time   08/11/11  9:00 PM      Component Value Range   Squamous Epithelial / LPF RARE  RARE    WBC, UA 0-2  <3 (WBC/hpf)   RBC / HPF 11-20  <3 (RBC/hpf)   Urine-Other MUCOUS PRESENT    CBC     Status: Abnormal   Collection Time   08/11/11  9:36 PM      Component Value Range   WBC 10.1  4.0 - 10.5 (K/uL)   RBC 3.44 (*) 3.87 - 5.11 (MIL/uL)   Hemoglobin 9.4 (*) 12.0 - 15.0 (g/dL)   HCT 09.6 (*) 04.5 - 46.0 (%)   MCV 86.6  78.0 - 100.0 (fL)   MCH 27.3  26.0 - 34.0 (pg)   MCHC 31.5  30.0 - 36.0 (g/dL)   RDW 40.9  81.1 - 91.4 (%)   Platelets 275  150 - 400 (K/uL)  COMPREHENSIVE METABOLIC PANEL     Status: Abnormal   Collection Time   08/11/11  9:36 PM      Component Value Range   Sodium 138  135 - 145 (mEq/L)   Potassium 4.1  3.5 - 5.1 (mEq/L)   Chloride 101  96 - 112 (mEq/L)   CO2 24  19 - 32 (mEq/L)   Glucose, Bld 98  70 - 99 (mg/dL)   BUN 14  6 - 23 (mg/dL)   Creatinine, Ser 7.82  0.50 - 1.10 (mg/dL)   Calcium 9.5  8.4 - 95.6 (mg/dL)   Total Protein 6.6  6.0 - 8.3 (g/dL)   Albumin 2.4 (*) 3.5 - 5.2 (g/dL)   AST 25  0 - 37 (U/L)   ALT 52 (*) 0 - 35 (U/L)   Alkaline Phosphatase 117  39 - 117 (U/L)   Total Bilirubin 0.2 (*) 0.3 - 1.2 (mg/dL)   GFR calc non Af Amer >90  >90 (mL/min)   GFR calc Af Amer >90  >90 (mL/min)  URIC ACID     Status: Abnormal   Collection Time   08/11/11  9:36 PM      Component Value Range   Uric Acid, Serum 7.8 (*) 2.4 - 7.0 (mg/dL)  LACTATE DEHYDROGENASE     Status: Abnormal   Collection Time   08/11/11  9:36 PM      Component Value Range   LDH 471 (*) 94 - 250 (U/L)    Discussed pt with Dr. Tamela Oddi. Pt currently asymptomatic. Dr. Tamela Oddi wishes to give pt precautions and have her f/u with Dr. Gaynell Face on Monday. Assessment and Plan  Hx of preeclampsia: discussed with pt  at length. She is to return immediately if she begins to have worsening HA, blurry vision, RUQ pain, or any other sx. She will f/u with Dr. Gaynell Face on Monday. Discussed signs and sx of preeclampsia with pt at length. Discussed diet, activity, risks, and precautions.  Clinton Gallant. Eniya Cannady III, DrHSc, MPAS, PA-C  08/11/2011, 9:45 PM

## 2011-08-11 NOTE — Discharge Instructions (Signed)
Preeclampsia and Eclampsia  Preeclampsia is a condition of high blood pressure during pregnancy. It can happen at 20 weeks or later in pregnancy. If high blood pressure occurs in the second half of pregnancy with no other symptoms, it is called gestational hypertension and goes away after the baby is born. If any of the symptoms listed below develop with gestational hypertension, it is then called preeclampsia. Eclampsia (convulsions) may follow preeclampsia. This is one of the reasons for regular prenatal checkups. Early diagnosis and treatment are very important to prevent eclampsia.  CAUSES   There is no known cause of preeclampsia/eclampsia in pregnancy. There are several known conditions that may put the pregnant woman at risk, such as:   The first pregnancy.   Having preeclampsia in a past pregnancy.   Having lasting (chronic) high blood pressure.   Having multiples (twins, triplets).   Being age 35 or older.   African American ethnic background.   Having kidney disease or diabetes.   Medical conditions such as lupus or blood diseases.   Being overweight (obese).  SYMPTOMS    High blood pressure.   Headaches.   Sudden weight gain.   Swelling of hands, face, legs, and feet.   Protein in the urine.   Feeling sick to your stomach (nauseous) and throwing up (vomiting).   Vision problems (blurred or double vision).   Numbness in the face, arms, legs, and feet.   Dizziness.   Slurred speech.   Preeclampsia can cause growth retardation in the fetus.   Separation (abruption) of the placenta.   Not enough fluid in the amniotic sac (oligohydramnios).   Sensitivity to bright lights.   Belly (abdominal) pain.  DIAGNOSIS   If protein is found in the urine in the second half of pregnancy, this is considered preeclampsia. Other symptoms mentioned above may also be present.  TREATMENT   It is necessary to treat this.   Your caregiver may prescribe bed rest early in this condition. Plenty of rest and  salt restriction may be all that is needed.   Medicines may be necessary to lower blood pressure if the condition does not respond to more conservative measures.   In more severe cases, hospitalization may be needed:   For treatment of blood pressure.   To control fluid retention.   To monitor the baby to see if the condition is causing harm to the baby.   Hospitalization is the best way to treat the first sign of preeclampsia. This is so the mother and baby can be watched closely and blood tests can be done effectively and correctly.   If the condition becomes severe, it may be necessary to induce labor or to remove the infant by surgical means (cesarean section). The best cure for preeclampsia/eclampsia is to deliver the baby.  Preeclampsia and eclampsia involve risks to mother and infant. Your caregiver will discuss these risks with you. Together, you can work out the best possible approach to your problems. Make sure you keep your prenatal visits as scheduled. Not keeping appointments could result in a chronic or permanent injury, pain, disability to you, and death or injury to you or your unborn baby. If there is any problem keeping the appointment, you must call to reschedule.  HOME CARE INSTRUCTIONS    Keep your prenatal appointments and tests as scheduled.   Tell your caregiver if you have any of the above risk factors.   Get plenty of rest and sleep.   Eat a balanced   diet that is low in salt, and do not add salt to your food.   Avoid stressful situations.   Only take over-the-counter and prescriptions medicines for pain, discomfort, or fever as directed by your caregiver.  SEEK IMMEDIATE MEDICAL CARE IF:    You develop severe swelling anywhere in the body. This usually occurs in the legs.   You gain 5 lb/2.3 kg or more in a week.   You develop a severe headache, dizziness, problems with your vision, or confusion.   You have abdominal pain, nausea, or vomiting.   You have a seizure.   You  have trouble moving any part of your body, or you develop numbness or problems speaking.   You have bruising or abnormal bleeding from anywhere in the body.   You develop a stiff neck.   You pass out.  MAKE SURE YOU:    Understand these instructions.   Will watch your condition.   Will get help right away if you are not doing well or get worse.  Document Released: 04/14/2000 Document Revised: 04/06/2011 Document Reviewed: 11/29/2007  [employer] Patient Information 2012 [employer].

## 2011-08-11 NOTE — MAU Note (Signed)
Patient is here with c/o elevated bp amd headache all day. Patient that the "baby live nurse" came this morning and told her that her bp is high but she didn't want to come to hospital. She states that she came now because she isn't feeling well. Headache with intermittent dizziness. She had a vaginal delivery 4/6 went home on 4/9. Takes labetalol 200mg  three times a day. Already took 2 doses today, last dose at about 1400pm.

## 2011-08-19 ENCOUNTER — Inpatient Hospital Stay (HOSPITAL_COMMUNITY): Admission: RE | Admit: 2011-08-19 | Payer: Medicaid Other | Source: Ambulatory Visit

## 2011-09-26 ENCOUNTER — Emergency Department (HOSPITAL_COMMUNITY)
Admission: EM | Admit: 2011-09-26 | Discharge: 2011-09-26 | Disposition: A | Discharge status: Home Health | Payer: Medicaid Other | Attending: Emergency Medicine | Admitting: Emergency Medicine

## 2011-09-26 ENCOUNTER — Encounter (HOSPITAL_COMMUNITY): Payer: Self-pay | Admitting: Emergency Medicine

## 2011-09-26 DIAGNOSIS — K089 Disorder of teeth and supporting structures, unspecified: Secondary | ICD-10-CM | POA: Insufficient documentation

## 2011-09-26 DIAGNOSIS — K0889 Other specified disorders of teeth and supporting structures: Secondary | ICD-10-CM

## 2011-09-26 DIAGNOSIS — R220 Localized swelling, mass and lump, head: Secondary | ICD-10-CM | POA: Insufficient documentation

## 2011-09-26 DIAGNOSIS — K047 Periapical abscess without sinus: Secondary | ICD-10-CM

## 2011-09-26 MED ORDER — HYDROCODONE-ACETAMINOPHEN 5-325 MG PO TABS
1.0000 | ORAL_TABLET | Freq: Once | ORAL | Status: AC
  Administered 2011-09-26: 1 via ORAL
  Filled 2011-09-26: qty 1

## 2011-09-26 MED ORDER — AMOXICILLIN 500 MG PO CAPS
500.0000 mg | ORAL_CAPSULE | Freq: Once | ORAL | Status: AC
  Administered 2011-09-26: 500 mg via ORAL
  Filled 2011-09-26: qty 1

## 2011-09-26 MED ORDER — AMOXICILLIN 500 MG PO CAPS: 500.0000 mg | ORAL_CAPSULE | Freq: Three times a day (TID) | ORAL | Status: AC

## 2011-09-26 MED ORDER — HYDROCODONE-ACETAMINOPHEN 5-500 MG PO TABS: 1.0000 | ORAL_TABLET | Freq: Four times a day (QID) | ORAL | Status: AC | PRN

## 2011-09-26 MED ORDER — IBUPROFEN 200 MG PO TABS
600.0000 mg | ORAL_TABLET | Freq: Once | ORAL | Status: AC
  Administered 2011-09-26: 600 mg via ORAL
  Filled 2011-09-26: qty 3

## 2011-09-26 NOTE — ED Notes (Signed)
Onset one day ago dental pain 8/10 throbbing radiating to left side of face. Airway intact

## 2011-09-26 NOTE — ED Provider Notes (Signed)
History  Scribed for Suzi Roots, MD, the patient was seen in room STRE5/STRE5. This chart was scribed by Candelaria Stagers. The patient's care started at 5:57 PM    CSN: [id number]  Arrival date & time 09/26/11  1658   First MD Initiated Contact with Patient 09/26/11 1755      Chief Complaint  Patient presents with  . Dental Pain    The history is provided by the patient.   Robin Horn is a 26 y.o. female who presents to the Emergency Department complaining of dental pain that started yesterday on the left bottom side.  She denies fever.    Pt states that it hurts to chew.  Pt has had previous abscess on the same side and was seen in the ED and given antibiotics which improved the sx.  Pt is not currently breastfeeding.  Pain constant, dull non radiating. No trouble breathing or swallowing, no neck pain. No headache. Has no local dentist.   Past Medical History  Diagnosis Date  . Abnormal Pap smear of cervix   . Gonorrhea   . Chlamydia   . Herpes   . Pregnant state, incidental   . Migraines   . Pregnancy induced hypertension     Past Surgical History  Procedure Date  . Colposcopy   . No past surgeries     No family history on file.  History  Substance Use Topics  . Smoking status: Current Everyday Smoker -- 0.5 packs/day    Types: Cigarettes  . Smokeless tobacco: Never Used  . Alcohol Use: No    OB History    Grav Para Term Preterm Abortions TAB SAB Ect Mult Living   7 4 4  0 3 3 0 0 0 4      Review of Systems  Constitutional: Negative for fever and chills.  HENT: Positive for facial swelling (bottom left) and dental problem (bottom left).   Respiratory: Negative for shortness of breath.   Gastrointestinal: Negative for vomiting.  Neurological: Negative for headaches.    Allergies  Review of patient's allergies indicates no known allergies.  Home Medications   Current Outpatient Rx  Name Route Sig Dispense Refill  . VALACYCLOVIR HCL 500 MG PO  TABS Oral Take 500 mg by mouth daily.      BP 125/89  Pulse 76  Temp(Src) 98.3 F (36.8 C) (Oral)  SpO2 100%  Breastfeeding? Unknown  Physical Exam  Nursing note and vitals reviewed. Constitutional: She is oriented to person, place, and time. She appears well-developed and well-nourished. No distress.  HENT:  Head: Normocephalic and atraumatic.  Nose: Nose normal.  Mouth/Throat: Oropharynx is clear and moist.       Left lower molar tenderness, associated gum swelling and tenderness. No trismus.  ?sl prominence to area on facial exam, no gross facial swelling or erythema noted.  No pharyngeal erythema or swelling noted. No swelling, pain or tenderness to floor of mouth or neck.   Eyes: EOM are normal. Right eye exhibits no discharge. Left eye exhibits no discharge.  Neck: Neck supple.       No stiffness or rigidity  Cardiovascular: Normal rate.   Pulmonary/Chest: Effort normal. No respiratory distress.  Musculoskeletal: She exhibits no edema.  Lymphadenopathy:    She has no cervical adenopathy.  Neurological: She is alert and oriented to person, place, and time.  Skin: Skin is warm and dry. She is not diaphoretic.  Psychiatric: She has a normal mood and affect.  ED Course  Procedures  DIAGNOSTIC STUDIES: Oxygen Saturation is 100% on room air, normal by my interpretation.    COORDINATION OF CARE:       MDM  I personally performed the services described in this documentation, which was scribed in my presence. The recorded information has been reviewed and considered. Suzi Roots, MD   Pt has ride, does not have to drive.  Notes similar dental abscess in same location in past that improved/resolve w abx, states did not have chance to follow up with dentist as was pregnant at the time.  Pt states is not breastfeeding her baby. vicodin po. Motrin po. amox po. Confirmed nkda w pt.          Suzi Roots, MD 09/26/11 [phone]

## 2011-09-26 NOTE — Discharge Instructions (Signed)
Take antibiotic (amoxicillin) as prescribed. Take motrin or aleve as need for pain. You may also take vicodin as need for pain. No driving for the next 6 hours or when taking vicodin. Also, do not take tylenol or acetaminophen containing medication when taking vicodin. Follow up with dentist in the next few days  - call office tomorrow morning to arrange follow up appointment.  Return to ER if worse, facial/neck swelling, high fevers, intractable pain, trouble breathing or swallowing, other concern.     Dental Abscess A dental abscess usually starts from an infected tooth. Antibiotic medicine and pain pills can be helpful, but dental infections require the attention of a dentist. Rinse around the infected area often with salt water (a pinch of salt in 8 oz of warm water). Do not apply heat to the outside of your face. See your dentist or oral surgeon as soon as possible.  SEEK IMMEDIATE MEDICAL CARE IF:  You have increasing, severe pain that is not relieved by medicine.   You or your child has an oral temperature above 102 F (38.9 C), not controlled by medicine.   Your baby is older than 3 months with a rectal temperature of 102 F (38.9 C) or higher.   Your baby is 6 months old or younger with a rectal temperature of 100.4 F (38 C) or higher.   You develop chills, severe headache, difficulty breathing, or trouble swallowing.   You have swelling in the neck or around the eye.  Document Released: 04/17/2005 Document Revised: 04/06/2011 Document Reviewed: 09/26/2006 [place] [facility] Patient Information 2012 [place], [place].

## 2011-09-26 NOTE — ED Notes (Signed)
Patient complaining of toothache on left side of mouth that started yesterday.  Pain increasing became worse today.  Patient alert and oriented x4; PERRL present.  Will continue to monitor.

## 2011-11-05 ENCOUNTER — Emergency Department (HOSPITAL_COMMUNITY)
Admission: EM | Admit: 2011-11-05 | Discharge: 2011-11-05 | Disposition: A | Discharge status: Home / Self Care | Payer: Medicaid Other | Attending: Emergency Medicine | Admitting: Emergency Medicine

## 2011-11-05 ENCOUNTER — Encounter (HOSPITAL_COMMUNITY): Payer: Self-pay | Admitting: Nurse Practitioner

## 2011-11-05 DIAGNOSIS — S335XXA Sprain of ligaments of lumbar spine, initial encounter: Secondary | ICD-10-CM | POA: Insufficient documentation

## 2011-11-05 DIAGNOSIS — Y9289 Other specified places as the place of occurrence of the external cause: Secondary | ICD-10-CM | POA: Insufficient documentation

## 2011-11-05 DIAGNOSIS — J029 Acute pharyngitis, unspecified: Secondary | ICD-10-CM

## 2011-11-05 DIAGNOSIS — Y990 Civilian activity done for income or pay: Secondary | ICD-10-CM | POA: Insufficient documentation

## 2011-11-05 DIAGNOSIS — F17200 Nicotine dependence, unspecified, uncomplicated: Secondary | ICD-10-CM | POA: Insufficient documentation

## 2011-11-05 DIAGNOSIS — X500XXA Overexertion from strenuous movement or load, initial encounter: Secondary | ICD-10-CM | POA: Insufficient documentation

## 2011-11-05 DIAGNOSIS — S39012A Strain of muscle, fascia and tendon of lower back, initial encounter: Secondary | ICD-10-CM

## 2011-11-05 MED ORDER — DEXAMETHASONE 2 MG PO TABS
12.0000 mg | ORAL_TABLET | Freq: Once | ORAL | Status: AC
  Administered 2011-11-05: 12 mg via ORAL
  Filled 2011-11-05: qty 6

## 2011-11-05 MED ORDER — IBUPROFEN 400 MG PO TABS
800.0000 mg | ORAL_TABLET | Freq: Once | ORAL | Status: AC
  Administered 2011-11-05: 800 mg via ORAL
  Filled 2011-11-05: qty 2

## 2011-11-05 MED ORDER — NAPROXEN 500 MG PO TABS: 500.0000 mg | ORAL_TABLET | Freq: Two times a day (BID) | ORAL | Status: DC

## 2011-11-05 MED ORDER — OXYCODONE-ACETAMINOPHEN 5-325 MG PO TABS: 1.0000 | ORAL_TABLET | ORAL | Status: AC | PRN

## 2011-11-05 MED ORDER — OXYCODONE-ACETAMINOPHEN 5-325 MG PO TABS
1.0000 | ORAL_TABLET | Freq: Once | ORAL | Status: AC
  Administered 2011-11-05: 1 via ORAL
  Filled 2011-11-05: qty 1

## 2011-11-05 MED ORDER — CYCLOBENZAPRINE HCL 10 MG PO TABS: 10.0000 mg | ORAL_TABLET | Freq: Three times a day (TID) | ORAL | Status: AC | PRN

## 2011-11-05 MED ORDER — CYCLOBENZAPRINE HCL 10 MG PO TABS
10.0000 mg | ORAL_TABLET | Freq: Once | ORAL | Status: AC
  Administered 2011-11-05: 10 mg via ORAL
  Filled 2011-11-05: qty 1

## 2011-11-05 NOTE — ED Provider Notes (Signed)
History   This chart was scribed for Dione Booze, MD scribed by Magnus Sinning. The patient was seen in room TR04C/TR04C seen at 18:04   CSN: [id number]  Arrival date & time 11/05/11  1609   None     Chief Complaint  Patient presents with  . Sore Throat  . Back Pain    (Consider location/radiation/quality/duration/timing/severity/associated sxs/prior treatment) HPI Robin Horn is a 26 y.o. female who presents to the Emergency Department complaining of constant moderate right lower back pain that radiates into upper back, onset 6 days. Reports associated leg tingling located near feet, constipation, which she's treated with stool softener, and pelvic pain. She states pain resulted from injury that occurred 6 days ago at work when doing heavy lifting. She says she heard a pop and has since experienced pain. Says she has not tried any treatments for pain. Patient currently rates pain a 8/10 and says she has hx of back problems. Patient also presents for eval of sore throat, onset 4 days. States hx of herpes. Denies SOB, difficultly swallowing, fever,chills, sweats, weakness, or numbness. Patient says that she is a current smoker at 1 pack/ day and also consumes ETOH occasionally.   PCP: None   Past Medical History  Diagnosis Date  . Abnormal Pap smear of cervix   . Gonorrhea   . Chlamydia   . Herpes   . Pregnant state, incidental   . Migraines   . Pregnancy induced hypertension     Past Surgical History  Procedure Date  . Colposcopy   . No past surgeries     History reviewed. No pertinent family history.  History  Substance Use Topics  . Smoking status: Current Everyday Smoker -- 1.0 packs/day    Types: Cigarettes  . Smokeless tobacco: Never Used  . Alcohol Use: No     social    OB History    Grav Para Term Preterm Abortions TAB SAB Ect Mult Living   7 4 4  0 3 3 0 0 0 4      Review of Systems  Constitutional: Negative for fever, chills and diaphoresis.  HENT:  Positive for sore throat. Negative for trouble swallowing.   Respiratory: Negative for shortness of breath.   Gastrointestinal: Positive for constipation.  Genitourinary: Positive for pelvic pain.  Musculoskeletal: Positive for back pain.  Neurological: Negative for weakness and numbness.  Psychiatric/Behavioral: The patient is not nervous/anxious.     Allergies  Review of patient's allergies indicates no known allergies.  Home Medications  No current outpatient prescriptions on file.  BP 124/78  Pulse 79  Temp 98.7 F (37.1 C) (Oral)  Resp 19  SpO2 100%  Breastfeeding? Unknown  Physical Exam  Nursing note and vitals reviewed. Constitutional: She is oriented to person, place, and time. She appears well-developed and well-nourished. No distress.  HENT:  Head: Normocephalic and atraumatic.       Pharynx mildy erythematous.   Eyes: EOM are normal.  Neck: Neck supple. No tracheal deviation present.  Cardiovascular: Normal rate.   Pulmonary/Chest: Effort normal. No respiratory distress.  Musculoskeletal: Normal range of motion.       No midline tenderness in the back. Mild right para lumbar tenderness. Positive straight leg raise on the right at 60 degrees.   Neurological: She is alert and oriented to person, place, and time.  Skin: Skin is warm and dry.  Psychiatric: She has a normal mood and affect. Her behavior is normal.    ED Course  Procedures (including critical care time) DIAGNOSTIC STUDIES: Oxygen Saturation is 100% on room air, normal by my interpretation.    COORDINATION OF CARE: 19:35: EDMD notes to patient that strep screen was negative. Patient notes her back pain has significantly improved with pain medication and muscle relaxant. Results for orders placed during the hospital encounter of 11/05/11  RAPID STREP SCREEN      Component Value Range   Streptococcus, Group A Screen (Direct) NEGATIVE  NEGATIVE     1. Lumbar strain   2. Viral pharyngitis        MDM  Rapid strep screen has come back negative. She likely has musculoskeletal lower back pain and a viral pharyngitis. She's given a dose of dexamethasone in the emergency department and sent home with prescriptions for naproxen, cyclobenzaprine, and Percocet.  I personally performed the services described in this documentation, which was scribed in my presence. The recorded information has been reviewed and considered.            Dione Booze, MD 11/09/11 [phone]

## 2011-11-05 NOTE — ED Notes (Signed)
Pt states she lifted something heavy on Monday and has noticed lower back pain since. Also c/o "Swollen glands" around throat and sore throat since Wednesday. No trouble swallowing or breathing.

## 2011-11-20 ENCOUNTER — Emergency Department (HOSPITAL_COMMUNITY)
Admission: EM | Admit: 2011-11-20 | Discharge: 2011-11-21 | Disposition: A | Discharge status: Home / Self Care | Payer: Medicaid Other | Attending: Emergency Medicine | Admitting: Emergency Medicine

## 2011-11-20 DIAGNOSIS — F17200 Nicotine dependence, unspecified, uncomplicated: Secondary | ICD-10-CM | POA: Insufficient documentation

## 2011-11-20 DIAGNOSIS — K0889 Other specified disorders of teeth and supporting structures: Secondary | ICD-10-CM

## 2011-11-20 DIAGNOSIS — J029 Acute pharyngitis, unspecified: Secondary | ICD-10-CM

## 2011-11-20 DIAGNOSIS — K089 Disorder of teeth and supporting structures, unspecified: Secondary | ICD-10-CM | POA: Insufficient documentation

## 2011-11-20 NOTE — ED Notes (Signed)
Pt reports right lower toothache for couple days.  Pt reports throat started hurting today with chills and fever

## 2011-11-21 MED ORDER — IBUPROFEN 800 MG PO TABS: 800.0000 mg | ORAL_TABLET | Freq: Three times a day (TID) | ORAL | Status: AC

## 2011-11-21 MED ORDER — PENICILLIN V POTASSIUM 250 MG PO TABS: 250.0000 mg | ORAL_TABLET | Freq: Four times a day (QID) | ORAL | Status: AC

## 2011-11-21 NOTE — ED Notes (Signed)
Pt verbalizes understanding 

## 2011-11-22 ENCOUNTER — Encounter (HOSPITAL_COMMUNITY): Payer: Self-pay | Admitting: Emergency Medicine

## 2011-11-22 ENCOUNTER — Emergency Department (HOSPITAL_COMMUNITY)
Admission: EM | Admit: 2011-11-22 | Discharge: 2011-11-22 | Disposition: A | Discharge status: Home / Self Care | Payer: Medicaid Other | Attending: Emergency Medicine | Admitting: Emergency Medicine

## 2011-11-22 DIAGNOSIS — J029 Acute pharyngitis, unspecified: Secondary | ICD-10-CM | POA: Insufficient documentation

## 2011-11-22 DIAGNOSIS — F17200 Nicotine dependence, unspecified, uncomplicated: Secondary | ICD-10-CM | POA: Insufficient documentation

## 2011-11-22 MED ORDER — ACETAMINOPHEN 325 MG PO TABS
650.0000 mg | ORAL_TABLET | Freq: Once | ORAL | Status: AC
  Administered 2011-11-22: 650 mg via ORAL
  Filled 2011-11-22: qty 2

## 2011-11-22 MED ORDER — OXYCODONE-ACETAMINOPHEN 5-325 MG PO TABS: 2.0000 | ORAL_TABLET | ORAL | Status: AC | PRN

## 2011-11-22 MED ORDER — OXYCODONE-ACETAMINOPHEN 5-325 MG PO TABS
1.0000 | ORAL_TABLET | Freq: Once | ORAL | Status: AC
  Administered 2011-11-22: 1 via ORAL
  Filled 2011-11-22: qty 1

## 2011-11-22 NOTE — ED Provider Notes (Signed)
Medical screening examination/treatment/procedure(s) were performed by non-physician practitioner and as supervising physician I was immediately available for consultation/collaboration.  Olivia Mackie, MD 11/22/11 [phone]

## 2011-11-22 NOTE — ED Provider Notes (Signed)
History     CSN: [id number]  Arrival date & time 11/20/11  2201   First MD Initiated Contact with Patient 11/20/11 2249      Chief Complaint  Patient presents with  . Dental Pain  . Sore Throat    (Consider location/radiation/quality/duration/timing/severity/associated sxs/prior treatment) HPI History from patient. 26 year old female presents with dental pain and sore throat. She reports that she has had pain to one of her right lower molars for the past 3-4 days. Pain is described as constant, sharp, worsens with chewing. She denies any trismus, facial swelling, difficulty swallowing. She has not had fever or chills.   She developed a sore throat on the right side of her throat today. Pain worsens with swallowing. She reports that she has had chills at home with this. She denies cough, congestion.  Past Medical History  Diagnosis Date  . Abnormal Pap smear of cervix   . Gonorrhea   . Chlamydia   . Herpes   . Pregnant state, incidental   . Migraines   . Pregnancy induced hypertension     Past Surgical History  Procedure Date  . Colposcopy   . No past surgeries     No family history on file.  History  Substance Use Topics  . Smoking status: Current Everyday Smoker -- 1.0 packs/day    Types: Cigarettes  . Smokeless tobacco: Never Used  . Alcohol Use: No     social    OB History    Grav Para Term Preterm Abortions TAB SAB Ect Mult Living   7 4 4  0 3 3 0 0 0 4      Review of Systems as per hpi  Allergies  Review of patient's allergies indicates no known allergies.  Home Medications   Current Outpatient Rx  Name Route Sig Dispense Refill  . LURASIDONE HCL 20 MG PO TABS Oral Take 1 tablet by mouth daily.    Marland Kitchen ZOLPIDEM TARTRATE 10 MG PO TABS Oral Take 10 mg by mouth at bedtime as needed. For sleep.    . IBUPROFEN 800 MG PO TABS Oral Take 1 tablet (800 mg total) by mouth 3 (three) times daily. Take with food. 21 tablet 0  . PENICILLIN V POTASSIUM 250 MG PO  TABS Oral Take 1 tablet (250 mg total) by mouth 4 (four) times daily. 40 tablet 0    BP 117/74  Pulse 59  Temp 98.9 F (37.2 C) (Oral)  Resp 14  SpO2 100%  Breastfeeding? Unknown  Physical Exam  Nursing note and vitals reviewed. Constitutional: She appears well-developed and well-nourished. No distress.  HENT:  Head: Normocephalic and atraumatic.  Mouth/Throat:         Teeth ttp as diagrammed. No gum purulence. No obvious evidence of dental abscess. 3rd molar appears to not be fully erupted through gum.  Pt's throat mildly erythematous with tonsillar enlargement. Tonsils symmetric. Uvula midline. No evidence PTA.   Neck: Normal range of motion.  Cardiovascular: Normal rate, regular rhythm and normal heart sounds.   Pulmonary/Chest: Effort normal and breath sounds normal. She exhibits no tenderness.  Abdominal: Soft. There is no tenderness.  Musculoskeletal: Normal range of motion.  Lymphadenopathy:    She has cervical adenopathy.  Neurological: She is alert.  Skin: Skin is warm and dry. She is not diaphoretic.  Psychiatric: She has a normal mood and affect.    ED Course  Procedures (including critical care time)  Labs Reviewed - No data to display No results found.  1. Pain, dental   2. Pharyngitis       MDM  Pt with sore throat and dental pain. Will tx with pcn. Return precautions discussed. Pt verbalized understanding and agreed to plan.  Grant Fontana, PA-C 11/22/11 [phone]

## 2011-11-22 NOTE — ED Notes (Signed)
C/o sore throat, chills, body aches, and bilateral ears draining x 2-3 days.

## 2011-11-22 NOTE — ED Notes (Signed)
Pt states, "My throat hurts, a bunch of wax has been coming out of my ears, and I didn't have my period." NAD noted. Tonsils are red and swollen. Pt is warm to touch

## 2011-11-22 NOTE — ED Notes (Signed)
Pt d/c home in NAD. Pt voiced understanding of d/c instructions and follow up care. Pt instructed not to drive after taking pain meds.

## 2011-11-22 NOTE — ED Notes (Signed)
Reports LMP beginning of June.  Pt states she doesn't know if she is pregnant or not.

## 2011-11-22 NOTE — ED Provider Notes (Signed)
History   This chart was scribed for Cheri Guppy, MD by Charolett Bumpers . The patient was seen in room TR09C/TR09C. Patient's care was started at 1629.    CSN: [id number]  Arrival date & time 11/22/11  1536   First MD Initiated Contact with Patient 11/22/11 1629      Chief Complaint  Patient presents with  . Sore Throat    (Consider location/radiation/quality/duration/timing/severity/associated sxs/prior treatment) HPI Robin Horn is a 26 y.o. female who presents to the Emergency Department complaining of constant, moderate sore throat with an onset of yesterday. Pt reports associated bilateral ear drainage, chills, and myalgias. Pt reports that she took one dose of Penicillin this morning with no relief. Pt states that she was seen [facility] earlier today and given the penicillin. Pt denies any other medical problems or daily medications.   Past Medical History  Diagnosis Date  . Abnormal Pap smear of cervix   . Gonorrhea   . Chlamydia   . Herpes   . Pregnant state, incidental   . Migraines   . Pregnancy induced hypertension     Past Surgical History  Procedure Date  . Colposcopy   . No past surgeries     No family history on file.  History  Substance Use Topics  . Smoking status: Current Everyday Smoker -- 1.0 packs/day    Types: Cigarettes  . Smokeless tobacco: Never Used  . Alcohol Use: No     social    OB History    Grav Para Term Preterm Abortions TAB SAB Ect Mult Living   7 4 4  0 3 3 0 0 0 4      Review of Systems  Constitutional: Positive for chills. Negative for fever.  HENT: Positive for sore throat and ear discharge.   Respiratory: Negative for shortness of breath.   Gastrointestinal: Negative for nausea and vomiting.  Musculoskeletal: Positive for myalgias.  Neurological: Negative for weakness.    Allergies  Review of patient's allergies indicates no known allergies.  Home Medications   Current Outpatient Rx  Name  Route Sig Dispense Refill  . IBUPROFEN 800 MG PO TABS Oral Take 1 tablet (800 mg total) by mouth 3 (three) times daily. Take with food. 21 tablet 0  . LURASIDONE HCL 20 MG PO TABS Oral Take 1 tablet by mouth daily.    Marland Kitchen PENICILLIN V POTASSIUM 250 MG PO TABS Oral Take 1 tablet (250 mg total) by mouth 4 (four) times daily. 40 tablet 0  . VALACYCLOVIR HCL 500 MG PO TABS Oral Take 500 mg by mouth daily.    Marland Kitchen ZOLPIDEM TARTRATE 10 MG PO TABS Oral Take 10 mg by mouth at bedtime as needed. For sleep.      BP 121/76  Pulse 120  Temp 103 F (39.4 C) (Oral)  Resp 22  SpO2 100%  LMP 09/30/2011  Physical Exam  Nursing note and vitals reviewed. Constitutional: She is oriented to person, place, and time. She appears well-developed and well-nourished. No distress.  HENT:  Head: Normocephalic and atraumatic.  Mouth/Throat: Oropharyngeal exudate and posterior oropharyngeal erythema present.       Bilaterally erythematous tonsils with purulent discharge. TM's are white bilaterally.   Eyes: EOM are normal.  Neck: Neck supple. No tracheal deviation present.  Cardiovascular: Normal rate.   Pulmonary/Chest: Effort normal. No respiratory distress.  Musculoskeletal: Normal range of motion.  Lymphadenopathy:    She has cervical adenopathy.  Neurological: She is alert and oriented  to person, place, and time.  Skin: Skin is warm and dry.  Psychiatric: She has a normal mood and affect. Her behavior is normal.    ED Course  Procedures (including critical care time)  DIAGNOSTIC STUDIES: Oxygen Saturation is 100% on room air, normal by my interpretation.    COORDINATION OF CARE:  17:15-Discussed planned course of treatment with the patient, who is agreeable at this time.     Labs Reviewed - No data to display No results found.   No diagnosis found.    MDM  Pharyngitis  I personally performed the services described in this documentation, which was scribed in my presence. The recorded  information has been reviewed and considered.         Cheri Guppy, MD 11/22/11 1719

## 2011-11-27 ENCOUNTER — Emergency Department (HOSPITAL_COMMUNITY)
Admission: EM | Admit: 2011-11-27 | Discharge: 2011-11-27 | Disposition: A | Discharge status: Home / Self Care | Payer: Medicaid Other | Attending: Emergency Medicine | Admitting: Emergency Medicine

## 2011-11-27 ENCOUNTER — Encounter (HOSPITAL_COMMUNITY): Payer: Self-pay | Admitting: *Deleted

## 2011-11-27 DIAGNOSIS — R070 Pain in throat: Secondary | ICD-10-CM | POA: Insufficient documentation

## 2011-11-27 DIAGNOSIS — L989 Disorder of the skin and subcutaneous tissue, unspecified: Secondary | ICD-10-CM | POA: Insufficient documentation

## 2011-11-27 NOTE — ED Notes (Signed)
Moving pt to off the floor

## 2011-11-27 NOTE — ED Notes (Addendum)
Pt reports continued sore throat, has been taken antibiotics for same, reports no relief. Pt reports white bumps to vaginal area and to legs. Pt reports hx of herpes. Pt was negative for strep.

## 2011-11-27 NOTE — ED Notes (Signed)
No answer

## 2011-11-27 NOTE — ED Notes (Signed)
CALLED THREE TIMES

## 2011-11-27 NOTE — ED Notes (Signed)
Pt was called for vitals with no answer.  °

## 2011-11-28 ENCOUNTER — Inpatient Hospital Stay (HOSPITAL_COMMUNITY)
Admission: AD | Admit: 2011-11-28 | Discharge: 2011-11-28 | Disposition: A | Discharge status: Home / Self Care | Payer: Medicaid Other | Source: Ambulatory Visit | Attending: Obstetrics | Admitting: Obstetrics

## 2011-11-28 ENCOUNTER — Encounter (HOSPITAL_COMMUNITY): Payer: Self-pay

## 2011-11-28 DIAGNOSIS — N949 Unspecified condition associated with female genital organs and menstrual cycle: Secondary | ICD-10-CM | POA: Insufficient documentation

## 2011-11-28 DIAGNOSIS — A6000 Herpesviral infection of urogenital system, unspecified: Secondary | ICD-10-CM | POA: Insufficient documentation

## 2011-11-28 HISTORY — DX: Depression, unspecified: F32.A

## 2011-11-28 HISTORY — DX: Major depressive disorder, single episode, unspecified: F32.9

## 2011-11-28 HISTORY — DX: Anxiety disorder, unspecified: F41.9

## 2011-11-28 LAB — WET PREP, GENITAL

## 2011-11-28 LAB — POCT PREGNANCY, URINE: Preg Test, Ur: NEGATIVE

## 2011-11-28 MED ORDER — ACYCLOVIR 400 MG PO TABS: 400.0000 mg | ORAL_TABLET | Freq: Four times a day (QID) | ORAL | Status: DC

## 2011-11-28 MED ORDER — ACYCLOVIR 400 MG PO TABS: 400.0000 mg | ORAL_TABLET | Freq: Four times a day (QID) | ORAL | Status: AC

## 2011-11-28 NOTE — MAU Note (Signed)
Pt states out of medication x8month for hsv. Was given abx for throat last week, after that noted white bumps with whiteheads at perineal area. Feels like it is spreading to both sides of vagina. Pt states sharp pains, denies abnormal vaginal discharge. On depo.

## 2011-11-28 NOTE — MAU Note (Signed)
Is concerned re cost of medication. Was recently laid off work and is unable to afford any medications.

## 2011-11-28 NOTE — MAU Note (Signed)
Patient states she has had a hard, painful area on the left side of the vagina. Has had HSV outbreaks in the past and thinks it might be that.

## 2011-11-28 NOTE — MAU Provider Note (Signed)
History     CSN: [id number]  Arrival date and time: 11/28/11 4098   First Provider Initiated Contact with Patient 11/28/11 [phone]      Chief Complaint  Patient presents with  . Vaginal Pain   HPI Robin Horn is 26 y.o. [id number] presents with report of white bumps on her legs and a "hard thing on by vagina causing pain".  Was seen by [facility] and [facility] for sore throat, neg culture and patient is taking penicillin for sxs.  LMP 09/30/11--last Depo 5/13.  Denies vaginal discharge but has the bump on vaginal.  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  Denies sexual activity in 2 -3 weeks.   Asking for STD screening.  Doesn't have insurance at this time so can't see Dr. Gaynell Face.    Past Medical History  Diagnosis Date  . Abnormal Pap smear of cervix   . Gonorrhea   . Chlamydia   . Herpes   . Pregnant state, incidental   . Migraines   . Pregnancy induced hypertension   . Depression   . Anxiety     Past Surgical History  Procedure Date  . Colposcopy   . Dilation and curettage of uterus     Family History  Problem Relation Age of Onset  . Other Neg Hx     History  Substance Use Topics  . Smoking status: Current Everyday Smoker -- 1.0 packs/day    Types: Cigarettes  . Smokeless tobacco: Never Used  . Alcohol Use: No     social    Allergies:  Allergies  Allergen Reactions  . Latuda (Lurasidone Hcl) Other (See Comments)    Hallucinations    Prescriptions prior to admission  Medication Sig Dispense Refill  . Lurasidone HCl (LATUDA) 20 MG TABS Take 1 tablet by mouth daily.      . penicillin v potassium (VEETID) 250 MG tablet Take 1 tablet (250 mg total) by mouth 4 (four) times daily.  40 tablet  0  . valACYclovir (VALTREX) 500 MG tablet Take 500 mg by mouth daily.      Marland Kitchen zolpidem (AMBIEN) 10 MG tablet Take 10 mg by mouth at bedtime as needed. For sleep.      Marland Kitchen ibuprofen (ADVIL,MOTRIN) 800 MG tablet Take 1 tablet (800 mg total) by mouth 3 (three) times daily. Take with  food.  21 tablet  0  . oxyCODONE-acetaminophen (PERCOCET/ROXICET) 5-325 MG per tablet Take 2 tablets by mouth every 4 (four) hours as needed for pain.  15 tablet  0    Review of Systems  Constitutional: Negative for fever and chills.  Respiratory: Negative.   Cardiovascular: Negative.   Gastrointestinal: Negative for nausea and vomiting.  Genitourinary:       + for vaginal lesion and pain   Physical Exam   Blood pressure 126/86, pulse 98, resp. rate 16, height 5\' 2"  (1.575 m), weight 64.774 kg (142 lb 12.8 oz), last menstrual period 09/30/2011, SpO2 100.00%, not currently breastfeeding.  Physical Exam  Constitutional: She is oriented to person, place, and time. She appears well-developed and well-nourished. No distress.  HENT:  Head: Normocephalic.  Neck: Normal range of motion.  Cardiovascular: Normal rate.   Respiratory: Effort normal.  GI: Soft. She exhibits no distension and no mass. There is no tenderness. There is no rebound and no guarding.  Genitourinary:    There is lesion (dime size lesion on the labial/vaginal wall  ulcerative, oozing with redness around the area of ulceration) on  the left labia. Uterus is not enlarged and not tender. Cervical motion tenderness: 2 ulcerations on the cervix.  Right adnexum displays no mass, no tenderness and no fullness. Left adnexum displays no mass, no tenderness and no fullness. There is tenderness around the vagina.       Negative for lymphadenopathy/groin  Neurological: She is alert and oriented to person, place, and time.  Skin: Skin is warm and dry.  Psychiatric: She has a normal mood and affect. Her behavior is normal. Thought content normal.   Results for orders placed during the hospital encounter of 11/28/11 (from the past 24 hour(s))  WET PREP, GENITAL     Status: Abnormal   Collection Time   11/28/11  8:43 AM      Component Value Range   Yeast Wet Prep HPF POC NONE SEEN  NONE SEEN   Trich, Wet Prep NONE SEEN  NONE SEEN    Clue Cells Wet Prep HPF POC FEW (*) NONE SEEN   WBC, Wet Prep HPF POC TOO NUMEROUS TO COUNT (*) NONE SEEN  POCT PREGNANCY, URINE     Status: Normal   Collection Time   11/28/11  8:48 AM      Component Value Range   Preg Test, Ur NEGATIVE  NEGATIVE    MAU Course  Procedures  GC/[facility] culture lab  MDM Dr. Erin Fulling in to evaluate ? Secondary infection.  Patient is currently taking Penicillin for pharyngitis==to continue Rx and add Acyclovir.    Rx appears to be written twice--one sent to pharmancy and one printed.   Assessment and Plan  A:  Active HSV infection   P:  Rx for Acyclovir given with refill      May use warm soaks Discussed importance of early treatment.  Robin Horn,EVE M 11/28/2011, 8:35 AM

## 2011-11-28 NOTE — MAU Note (Signed)
Dr. Erin Fulling at bedside, lesion assessed. Pt voices understanding of careplan.

## 2012-01-03 ENCOUNTER — Encounter (HOSPITAL_COMMUNITY): Payer: Self-pay | Admitting: Emergency Medicine

## 2012-01-03 DIAGNOSIS — F3289 Other specified depressive episodes: Secondary | ICD-10-CM | POA: Insufficient documentation

## 2012-01-03 DIAGNOSIS — F329 Major depressive disorder, single episode, unspecified: Secondary | ICD-10-CM | POA: Insufficient documentation

## 2012-01-03 DIAGNOSIS — F411 Generalized anxiety disorder: Secondary | ICD-10-CM | POA: Insufficient documentation

## 2012-01-03 DIAGNOSIS — Z888 Allergy status to other drugs, medicaments and biological substances status: Secondary | ICD-10-CM | POA: Insufficient documentation

## 2012-01-03 DIAGNOSIS — K047 Periapical abscess without sinus: Secondary | ICD-10-CM | POA: Insufficient documentation

## 2012-01-03 DIAGNOSIS — F17200 Nicotine dependence, unspecified, uncomplicated: Secondary | ICD-10-CM | POA: Insufficient documentation

## 2012-01-03 NOTE — ED Notes (Signed)
Tooth pain and decay lower right molar.

## 2012-01-04 ENCOUNTER — Emergency Department (HOSPITAL_COMMUNITY)
Admission: EM | Admit: 2012-01-04 | Discharge: 2012-01-04 | Disposition: A | Discharge status: Home / Self Care | Payer: Medicaid Other | Attending: Emergency Medicine | Admitting: Emergency Medicine

## 2012-01-04 DIAGNOSIS — K047 Periapical abscess without sinus: Secondary | ICD-10-CM

## 2012-01-04 MED ORDER — HYDROCODONE-ACETAMINOPHEN 7.5-500 MG/15ML PO SOLN: 15.0000 mL | Freq: Four times a day (QID) | ORAL | Status: DC | PRN

## 2012-01-04 MED ORDER — IBUPROFEN 800 MG PO TABS: 800.0000 mg | ORAL_TABLET | Freq: Three times a day (TID) | ORAL | Status: DC

## 2012-01-04 MED ORDER — BUPIVACAINE-EPINEPHRINE (PF) 0.5% -1:200000 IJ SOLN
INTRAMUSCULAR | Status: AC
  Filled 2012-01-04: qty 1.8

## 2012-01-04 MED ORDER — PENICILLIN V POTASSIUM 500 MG PO TABS: 500.0000 mg | ORAL_TABLET | Freq: Three times a day (TID) | ORAL | Status: DC

## 2012-01-04 NOTE — ED Notes (Signed)
Pt c/o right lower molar pain onset 1 day ago.  Has taken no meds, Rates pain 10/10 intermittent, sensitive to heat and cold.  Denies fever, swelling of gums or cheek.

## 2012-01-04 NOTE — ED Provider Notes (Signed)
History     CSN: [id number]  Arrival date & time 01/03/12  2221   First MD Initiated Contact with Patient 01/04/12 0201      Chief Complaint  Patient presents with  . Dental Pain    (Consider location/radiation/quality/duration/timing/severity/associated sxs/prior treatment) HPI Hx per PT. L lower dental pain on and off for weeks, worse tonight, no F/C, no n/v/d, hurts to chew, no trouble breathing or swallowing. Sharp in quality and no radiation. Mod to severe pain Past Medical History  Diagnosis Date  . Abnormal Pap smear of cervix   . Gonorrhea   . Chlamydia   . Herpes   . Pregnant state, incidental   . Migraines   . Pregnancy induced hypertension   . Depression   . Anxiety     Past Surgical History  Procedure Date  . Colposcopy   . Dilation and curettage of uterus     Family History  Problem Relation Age of Onset  . Other Neg Hx     History  Substance Use Topics  . Smoking status: Current Everyday Smoker -- 1.0 packs/day    Types: Cigarettes  . Smokeless tobacco: Never Used  . Alcohol Use: No     social    OB History    Grav Para Term Preterm Abortions TAB SAB Ect Mult Living   7 4 4  0 3 3 0 0 0 4      Review of Systems  Constitutional: Negative for fever and chills.  HENT: Positive for dental problem. Negative for neck pain and neck stiffness.   Eyes: Negative for pain.  Respiratory: Negative for shortness of breath.   Cardiovascular: Negative for chest pain.  Gastrointestinal: Negative for abdominal pain.  Genitourinary: Negative for dysuria.  Musculoskeletal: Negative for back pain.  Skin: Negative for rash.  Neurological: Negative for headaches.  All other systems reviewed and are negative.    Allergies  Latuda  Home Medications   Current Outpatient Rx  Name Route Sig Dispense Refill  . LURASIDONE HCL 20 MG PO TABS Oral Take 1 tablet by mouth daily.    Marland Kitchen PRESCRIPTION MEDICATION Oral Take 1 tablet by mouth at bedtime as needed. For  sleep      BP 133/73  Pulse 74  Temp 97.2 F (36.2 C) (Oral)  Resp 18  SpO2 100%  Physical Exam  Constitutional: She is oriented to person, place, and time. She appears well-developed and well-nourished.  HENT:  Head: Normocephalic and atraumatic.       L lower 3rd molar TTP, no gingival swelling, no trismus, uvula midline  Eyes: Conjunctivae and EOM are normal. Pupils are equal, round, and reactive to light.  Neck: Trachea normal. Neck supple.  Cardiovascular: Normal rate, regular rhythm, S1 normal, S2 normal and normal pulses.     No systolic murmur is present   No diastolic murmur is present  Pulses:      Radial pulses are 2+ on the right side, and 2+ on the left side.  Pulmonary/Chest: Effort normal and breath sounds normal. No stridor. She has no wheezes. She has no rhonchi. She has no rales. She exhibits no tenderness.  Abdominal: Soft. Normal appearance and bowel sounds are normal. There is no tenderness. There is no CVA tenderness and negative Murphy's sign.  Musculoskeletal:       BLE:s Calves nontender, no cords or erythema, negative Homans sign  Lymphadenopathy:    She has no cervical adenopathy.  Neurological: She is alert and oriented to  person, place, and time. She has normal strength. No cranial nerve deficit or sensory deficit. GCS eye subscore is 4. GCS verbal subscore is 5. GCS motor subscore is 6.  Skin: Skin is warm and dry. No rash noted. She is not diaphoretic.  Psychiatric: Her speech is normal.       Cooperative and appropriate    ED Course  Dental Date/Time: 01/04/2012 4:19 AM Performed by: Sunnie Nielsen Authorized by: Sunnie Nielsen Consent: Verbal consent obtained. Risks and benefits: risks, benefits and alternatives were discussed Consent given by: patient Patient understanding: patient states understanding of the procedure being performed Patient consent: the patient's understanding of the procedure matches consent given Procedure consent:  procedure consent matches procedure scheduled Required items: required blood products, implants, devices, and special equipment available Patient identity confirmed: verbally with patient Time out: Immediately prior to procedure a "time out" was called to verify the correct patient, procedure, equipment, support staff and site/side marked as required. Preparation: Patient was prepped and draped in the usual sterile fashion. Local anesthesia used: yes Anesthesia: local infiltration Local anesthetic: bupivacaine 0.5% without epinephrine Patient tolerance: Patient tolerated the procedure well with no immediate complications. Comments: L lower 3rd molar dental block with good pain control achieved   (including critical care time)    MDM   VS and nursing notes reviewed. Dental block, DDS referral prescription for ABx and pain medications provided.  Dental pain precautions verbalized as understood.         Sunnie Nielsen, MD 01/04/12 [phone]

## 2012-01-15 ENCOUNTER — Inpatient Hospital Stay (HOSPITAL_COMMUNITY)
Admission: AD | Admit: 2012-01-15 | Discharge: 2012-01-15 | Disposition: A | Discharge status: Home / Self Care | Payer: Medicaid Other | Source: Ambulatory Visit | Attending: Family Medicine | Admitting: Family Medicine

## 2012-01-15 ENCOUNTER — Encounter (HOSPITAL_COMMUNITY): Payer: Self-pay | Admitting: *Deleted

## 2012-01-15 DIAGNOSIS — Z3202 Encounter for pregnancy test, result negative: Secondary | ICD-10-CM

## 2012-01-15 DIAGNOSIS — R109 Unspecified abdominal pain: Secondary | ICD-10-CM | POA: Insufficient documentation

## 2012-01-15 DIAGNOSIS — R5381 Other malaise: Secondary | ICD-10-CM | POA: Insufficient documentation

## 2012-01-15 DIAGNOSIS — K047 Periapical abscess without sinus: Secondary | ICD-10-CM

## 2012-01-15 LAB — URINALYSIS, ROUTINE W REFLEX MICROSCOPIC
Hgb urine dipstick: NEGATIVE
Nitrite: NEGATIVE
Protein, ur: NEGATIVE mg/dL
Specific Gravity, Urine: <1.005 — ABNORMAL LOW (ref 1.005–1.030)
Urobilinogen, UA: 0.2 mg/dL (ref 0.0–1.0)

## 2012-01-15 LAB — POCT PREGNANCY, URINE: Preg Test, Ur: NEGATIVE

## 2012-01-15 MED ORDER — OXYCODONE-ACETAMINOPHEN 5-325 MG PO TABS: 1.0000 | ORAL_TABLET | Freq: Four times a day (QID) | ORAL | Status: DC | PRN

## 2012-01-15 MED ORDER — PENICILLIN V POTASSIUM 500 MG PO TABS: 500.0000 mg | ORAL_TABLET | Freq: Four times a day (QID) | ORAL | Status: DC

## 2012-01-15 NOTE — MAU Provider Note (Signed)
History     CSN: [id number]  Arrival date and time: 01/15/12 1043   First Provider Initiated Contact with Patient 01/15/12 1209      Chief Complaint  Patient presents with  . Nausea  . Abdominal Cramping   HPI Robin Horn is 26 y.o. [id number] [redacted]w[redacted]d weeks presenting with fatigue, lower abdominal pain. Denies vaginal discharge.  Recent HSV outbreak but had taken med for that.   LMP 12/15/11.   Had 1 positive and 1 negative UPT at home.  Last Depo April, not using contraception.  She wouldn't mind being pregnant.  Seen in ED 2 weeks ago and given antibiotic for tooth abscess but couldn't afford it. Lost Rx.  Now has medicaid and can get it.  Has appt to see a dentist in 1 month.  Thinks the tooth may be causing her to feel bad. She is not concerned about infection.  Recent cultures were negative.     Past Medical History  Diagnosis Date  . Abnormal Pap smear of cervix   . Gonorrhea   . Chlamydia   . Herpes   . Pregnant state, incidental   . Migraines   . Pregnancy induced hypertension   . Depression   . Anxiety     Past Surgical History  Procedure Date  . Colposcopy   . Dilation and curettage of uterus     Family History  Problem Relation Age of Onset  . Other Neg Hx   . Kidney disease Mother   . Cancer Paternal Grandmother     History  Substance Use Topics  . Smoking status: Current Every Day Smoker -- 1.0 packs/day    Types: Cigarettes  . Smokeless tobacco: Never Used  . Alcohol Use: No     social    Allergies: No Known Allergies  Prescriptions prior to admission  Medication Sig Dispense Refill  . acyclovir (ZOVIRAX) 400 MG tablet Take 1,200 mg by mouth daily.      Marland Kitchen amitriptyline (ELAVIL) 25 MG tablet Take 25 mg by mouth at bedtime.      . Lurasidone HCl (LATUDA) 20 MG TABS Take 1 tablet by mouth daily.        Review of Systems  Constitutional: Positive for malaise/fatigue.  HENT:       Right tooth ache  Respiratory: Negative.   Cardiovascular:  Negative.   Gastrointestinal: Positive for abdominal pain (lower abdominal pain). Negative for nausea and vomiting.  Genitourinary:       Negative for vaginal discharge/ bleeding   Physical Exam   Blood pressure 127/89, pulse 89, temperature 98.9 F (37.2 C), temperature source Oral, resp. rate 16, height 5\' 3"  (1.6 m), weight 63.504 kg (140 lb), last menstrual period 12/15/2011, not currently breastfeeding.  Physical Exam  Constitutional: She is oriented to person, place, and time. She appears well-developed and well-nourished. She appears distressed.  HENT:       Right tooth abscess--known/untreated  GI: She exhibits no distension and no mass. There is no tenderness. There is no rebound and no guarding.  Genitourinary:       Declined by patient  Neurological: She is alert and oriented to person, place, and time.  Skin: Skin is warm and dry.  Psychiatric: She has a normal mood and affect. Her behavior is normal.    MAU Course  Procedures  MDM  12:55  At time of discharge patient is asking for medication for her tooth pain. Assessment and Plan  A:  Tooth abscess  Fatigue     Negative UPT -has not missed period  P:  Rx written for tooth abscess --Penicillin 500mg  po q6hrs.      May take tylenol for both tooth and premenstrual discomfort     Rx given for Percocet #10 given for pain    KEY,EVE M 01/15/2012, 12:11 PM

## 2012-01-15 NOTE — MAU Note (Signed)
Also has an abscess tooth; c/o feeling very tired and loss of appetite;

## 2012-01-15 NOTE — MAU Note (Signed)
Last depo shot was in April 22;

## 2012-01-15 NOTE — MAU Note (Signed)
Pt is asking for rx for her mouth pain; mid-level notified;

## 2012-01-15 NOTE — MAU Provider Note (Signed)
Chart reviewed and agree with management and plan.  

## 2012-01-15 NOTE — MAU Note (Signed)
?   Pregnancy; had a + home UPT but MAU UPT is negative today;

## 2012-01-25 ENCOUNTER — Encounter (HOSPITAL_COMMUNITY): Payer: Self-pay | Admitting: *Deleted

## 2012-01-25 ENCOUNTER — Emergency Department (HOSPITAL_COMMUNITY)
Admission: EM | Admit: 2012-01-25 | Discharge: 2012-01-25 | Disposition: A | Discharge status: Home / Self Care | Payer: Medicaid Other | Attending: Emergency Medicine | Admitting: Emergency Medicine

## 2012-01-25 DIAGNOSIS — F3289 Other specified depressive episodes: Secondary | ICD-10-CM | POA: Insufficient documentation

## 2012-01-25 DIAGNOSIS — G43909 Migraine, unspecified, not intractable, without status migrainosus: Secondary | ICD-10-CM | POA: Insufficient documentation

## 2012-01-25 DIAGNOSIS — F411 Generalized anxiety disorder: Secondary | ICD-10-CM | POA: Insufficient documentation

## 2012-01-25 DIAGNOSIS — F329 Major depressive disorder, single episode, unspecified: Secondary | ICD-10-CM | POA: Insufficient documentation

## 2012-01-25 DIAGNOSIS — J069 Acute upper respiratory infection, unspecified: Secondary | ICD-10-CM | POA: Insufficient documentation

## 2012-01-25 DIAGNOSIS — F17200 Nicotine dependence, unspecified, uncomplicated: Secondary | ICD-10-CM | POA: Insufficient documentation

## 2012-01-25 LAB — CBC WITH DIFFERENTIAL/PLATELET
Basophils Absolute: 0 10*3/uL (ref 0.0–0.1)
Basophils Relative: 0 % (ref 0–1)
Eosinophils Relative: 0 % (ref 0–5)
HCT: 36.5 % (ref 36.0–46.0)
Lymphocytes Relative: 17 % (ref 12–46)
MCHC: 33.4 g/dL (ref 30.0–36.0)
Monocytes Absolute: 0.7 10*3/uL (ref 0.1–1.0)
Neutro Abs: 6.1 10*3/uL (ref 1.7–7.7)
Platelets: 261 10*3/uL (ref 150–400)
RDW: 15 % (ref 11.5–15.5)
WBC: 8.3 10*3/uL (ref 4.0–10.5)

## 2012-01-25 LAB — URINE MICROSCOPIC-ADD ON

## 2012-01-25 LAB — URINALYSIS, ROUTINE W REFLEX MICROSCOPIC
Bilirubin Urine: NEGATIVE
Ketones, ur: NEGATIVE mg/dL
Nitrite: NEGATIVE
Specific Gravity, Urine: 1.005 (ref 1.005–1.030)
Urobilinogen, UA: 0.2 mg/dL (ref 0.0–1.0)

## 2012-01-25 MED ORDER — ACETAMINOPHEN 325 MG PO TABS
ORAL_TABLET | ORAL | Status: AC
  Filled 2012-01-25: qty 2

## 2012-01-25 MED ORDER — PENICILLIN G BENZATHINE 1200000 UNIT/2ML IM SUSP
1.2000 10*6.[IU] | Freq: Once | INTRAMUSCULAR | Status: AC
  Administered 2012-01-25: 1.2 10*6.[IU] via INTRAMUSCULAR
  Filled 2012-01-25: qty 2

## 2012-01-25 MED ORDER — PROMETHAZINE HCL 25 MG PO TABS: 25.0000 mg | ORAL_TABLET | Freq: Four times a day (QID) | ORAL | Status: DC | PRN

## 2012-01-25 MED ORDER — ACETAMINOPHEN 325 MG PO TABS
650.0000 mg | ORAL_TABLET | Freq: Once | ORAL | Status: AC
  Administered 2012-01-25: 650 mg via ORAL

## 2012-01-25 NOTE — ED Notes (Signed)
Pt c/o HA and fever, pt has hx of headaches. Pt A&Ox4.

## 2012-01-25 NOTE — ED Notes (Addendum)
Pt here c/o fever, headache and malaise.  Pt was tx here 3 weeks prior for abscess to R lower molar, which appears to be healed.  Pt thinks she may be pregnant.  Pt was seen by her obgyn day before yesterday for abd pain and was dx with yeast infection.  Emesis x 3 and "runny" stool x 5.

## 2012-01-25 NOTE — ED Provider Notes (Signed)
History     CSN: [id number]  Arrival date & time 01/25/12  2007   None     Chief Complaint  Patient presents with  . Fever  . Migraine    (Consider location/radiation/quality/duration/timing/severity/associated sxs/prior treatment) HPI History provided by pt.   Pt woke this am with cold-like sx including mild sore throat and nasal congestion.  This afternoon she developed a fever and progressively worsening body aches, generalized weakness and lightheadedness.  Denies cough, SOB, CP.  Has been nauseous but no vomiting or diarrhea. Has had pressure in right pelvis, for which she has been evaluated by Gyn, and is currently stable.  She has had increased urinary frequency but otherwise no GU sx.  Has also had a headache that feels typical of her migraines and is much improved since receiving tylenol in triage today.  No rash.  Pt has no PMH.    Past Medical History  Diagnosis Date  . Abnormal Pap smear of cervix   . Gonorrhea   . Chlamydia   . Herpes   . Pregnant state, incidental   . Migraines   . Pregnancy induced hypertension   . Depression   . Anxiety     Past Surgical History  Procedure Date  . Colposcopy   . Dilation and curettage of uterus     Family History  Problem Relation Age of Onset  . Other Neg Hx   . Kidney disease Mother   . Cancer Paternal Grandmother     History  Substance Use Topics  . Smoking status: Current Every Day Smoker -- 1.0 packs/day    Types: Cigarettes  . Smokeless tobacco: Never Used  . Alcohol Use: No     social    OB History    Grav Para Term Preterm Abortions TAB SAB Ect Mult Living   8 4 4  0 3 3 0 0 0 4      Review of Systems  All other systems reviewed and are negative.    Allergies  Review of patient's allergies indicates no known allergies.  Home Medications   Current Outpatient Rx  Name Route Sig Dispense Refill  . ACYCLOVIR 400 MG PO TABS Oral Take 1,200 mg by mouth daily.    Marland Kitchen AMITRIPTYLINE HCL 25 MG PO  TABS Oral Take 25 mg by mouth at bedtime.    Marland Kitchen LURASIDONE HCL 20 MG PO TABS Oral Take 1 tablet by mouth daily.    . OXYCODONE-ACETAMINOPHEN 5-325 MG PO TABS Oral Take 1-2 tablets by mouth every 6 (six) hours as needed for pain. 10 tablet 0  . PENICILLIN V POTASSIUM 500 MG PO TABS Oral Take 1 tablet (500 mg total) by mouth 4 (four) times daily. 28 tablet 0    BP 133/85  Pulse 122  Temp 99.9 F (37.7 C) (Oral)  Resp 20  SpO2 100%  LMP 12/15/2011  Breastfeeding? Unknown  Physical Exam  Nursing note and vitals reviewed. Constitutional: She is oriented to person, place, and time. She appears well-developed and well-nourished.  HENT:  Head: Normocephalic and atraumatic.       R tonsil w/ exudate.  Erythema posterior pharynx  Eyes:       Normal appearance  Neck: Normal range of motion. Neck supple. No rigidity. No Brudzinski's sign and no Kernig's sign noted.  Cardiovascular: Normal rate, regular rhythm and intact distal pulses.   Pulmonary/Chest: Effort normal and breath sounds normal.  Abdominal:       Mild tenderness epigastrium as well  as LLQ.  No tenderness in RLQ or right pelvis where patient had been experiencing pressure.   Genitourinary:       No CVA ttp  Musculoskeletal: Normal range of motion.  Lymphadenopathy:    She has no cervical adenopathy.  Neurological: She is alert and oriented to person, place, and time. No sensory deficit. Coordination normal.       CN 3-12 intact.  No nystagmus.  5/5 and equal upper and lower extremity strength.  No past pointing.    Skin: Skin is warm and dry. No rash noted.  Psychiatric: She has a normal mood and affect. Her behavior is normal.    ED Course  Procedures (including critical care time)  Labs Reviewed  URINALYSIS, ROUTINE W REFLEX MICROSCOPIC - Abnormal; Notable for the following:    Protein, ur 30 (*)     Leukocytes, UA TRACE (*)     All other components within normal limits  URINE MICROSCOPIC-ADD ON - Abnormal; Notable  for the following:    Squamous Epithelial / LPF MANY (*)     Bacteria, UA FEW (*)     All other components within normal limits  CBC WITH DIFFERENTIAL  POCT PREGNANCY, URINE  RAPID STREP SCREEN   No results found.   1. URI (upper respiratory infection)   2. Migraine       MDM  26yo F presents w/ fever.  She has had associated headache, though improved w/ tylenol and typical of her migraines. No focal neuro deficits or meningeal signs on exam and pt is well-appearing.  Doubt meningitis.  Has had increased urinary frequency but no other GU sx and U/A neg for infection.  Has had pressure in right pelvis but no tenderness here on exam and has already been evaluated by GYN for this problem this week.  Has had mild sore throat and nasal congestion.  Right tonsillar exudate and pharyngeal erythema on exam.  Strep screen negative.  Did not order CXR because no pulmonary sx and HR/RR w/in nml range once fever treated w/ tylenol in ED.  No recent tick exposure or c/o rash.  I suspect that patient is developing a viral URI.  She would like to be treated for possible strep pharyngitis because 5mo baby at home and no PCP to f/u with.  She received IM bicillin.  D/c'd home w/ referral to neuro for further evaluation of frequent migraines.  Instructed to return if headache changes/worsens, abd pressure worsens, uncontrolled vomiting or development of rash.  11:07 PM         Otilio Miu, PA 01/25/12 [phone]

## 2012-01-25 NOTE — ED Notes (Signed)
i-Stat CHEM8+ Results:  Na 138 K 3.8 Cl 104 iCa 1.20 TCO2 21  Glu 88 BUN 6 Crea 1.0 Hct 37 Hb* 12.6  AnGap 17

## 2012-01-26 LAB — POCT I-STAT, CHEM 8
BUN: 6 mg/dL (ref 6–23)
Chloride: 104 mEq/L (ref 96–112)
Creatinine, Ser: 1 mg/dL (ref 0.50–1.10)
Glucose, Bld: 88 mg/dL (ref 70–99)
Hemoglobin: 12.6 g/dL (ref 12.0–15.0)
Potassium: 3.8 mEq/L (ref 3.5–5.1)
Sodium: 138 mEq/L (ref 135–145)

## 2012-01-26 NOTE — ED Provider Notes (Signed)
Medical screening examination/treatment/procedure(s) were performed by non-physician practitioner and as supervising physician I was immediately available for consultation/collaboration.   [name], MD 01/26/12 2308

## 2012-02-19 ENCOUNTER — Encounter (HOSPITAL_COMMUNITY): Payer: Self-pay | Admitting: Emergency Medicine

## 2012-02-19 ENCOUNTER — Emergency Department (HOSPITAL_COMMUNITY)
Admission: EM | Admit: 2012-02-19 | Discharge: 2012-02-19 | Disposition: A | Discharge status: Home / Self Care | Payer: Medicaid Other | Attending: Emergency Medicine | Admitting: Emergency Medicine

## 2012-02-19 DIAGNOSIS — Z8619 Personal history of other infectious and parasitic diseases: Secondary | ICD-10-CM | POA: Insufficient documentation

## 2012-02-19 DIAGNOSIS — Z87410 Personal history of cervical dysplasia: Secondary | ICD-10-CM | POA: Insufficient documentation

## 2012-02-19 DIAGNOSIS — Z8659 Personal history of other mental and behavioral disorders: Secondary | ICD-10-CM | POA: Insufficient documentation

## 2012-02-19 DIAGNOSIS — Z87891 Personal history of nicotine dependence: Secondary | ICD-10-CM | POA: Insufficient documentation

## 2012-02-19 DIAGNOSIS — J029 Acute pharyngitis, unspecified: Secondary | ICD-10-CM

## 2012-02-19 DIAGNOSIS — B009 Herpesviral infection, unspecified: Secondary | ICD-10-CM | POA: Insufficient documentation

## 2012-02-19 DIAGNOSIS — Z8669 Personal history of other diseases of the nervous system and sense organs: Secondary | ICD-10-CM | POA: Insufficient documentation

## 2012-02-19 DIAGNOSIS — I10 Essential (primary) hypertension: Secondary | ICD-10-CM | POA: Insufficient documentation

## 2012-02-19 DIAGNOSIS — R51 Headache: Secondary | ICD-10-CM | POA: Insufficient documentation

## 2012-02-19 MED ORDER — AMOXICILLIN 500 MG PO CAPS: 500.0000 mg | ORAL_CAPSULE | Freq: Three times a day (TID) | ORAL | Status: DC

## 2012-02-19 MED ORDER — IBUPROFEN 600 MG PO TABS: 600.0000 mg | ORAL_TABLET | Freq: Four times a day (QID) | ORAL | Status: DC | PRN

## 2012-02-19 MED ORDER — TRAMADOL HCL 50 MG PO TABS: 50.0000 mg | ORAL_TABLET | Freq: Four times a day (QID) | ORAL | Status: DC | PRN

## 2012-02-19 MED ORDER — HYDROCODONE-ACETAMINOPHEN 7.5-500 MG/15ML PO SOLN
10.0000 mL | Freq: Once | ORAL | Status: AC
  Administered 2012-02-19: 10 mL via ORAL
  Filled 2012-02-19: qty 15

## 2012-02-19 MED ORDER — IBUPROFEN 200 MG PO TABS
600.0000 mg | ORAL_TABLET | Freq: Once | ORAL | Status: AC
  Administered 2012-02-19: 600 mg via ORAL
  Filled 2012-02-19: qty 3

## 2012-02-19 NOTE — ED Notes (Signed)
Pt c/o loss of appetite since yesterday am, no urine or bm in two days, 9/10 pain at head and throat, and general body ache. Pt c/o chills. Pt A&Ox4, ambulates independently.

## 2012-02-19 NOTE — ED Provider Notes (Signed)
History     CSN: [id number]  Arrival date & time 02/19/12  0865   First MD Initiated Contact with Patient 02/19/12 [phone]      Chief Complaint  Patient presents with  . Headache  . Sore Throat    (Consider location/radiation/quality/duration/timing/severity/associated sxs/prior treatment) HPI Comments: Robin Horn is a 26 y.o. Female who presents complaining of sore throat, headache, fever, body aches for two days. States taking over the counter flu medications with no improvement. States last dose taken last night. Pt states also having a headache. Denies photophobia, denies neck pain or stiffness, denies earache, nasal congestion, cough. States also some mouth sores, has been using oragel for it. States pain worsened with swallowing. No SOB, n/v/d, abdominal pain.   The history is provided by the patient.    Past Medical History  Diagnosis Date  . Abnormal Pap smear of cervix   . Gonorrhea   . Chlamydia   . Herpes   . Pregnant state, incidental   . Migraines   . Depression   . Anxiety   . Hypertension     Past Surgical History  Procedure Date  . Colposcopy   . Dilation and curettage of uterus     Family History  Problem Relation Age of Onset  . Other Neg Hx   . Kidney disease Mother   . Cancer Paternal Grandmother     History  Substance Use Topics  . Smoking status: Current Every Day Smoker -- 1.0 packs/day    Types: Cigarettes  . Smokeless tobacco: Never Used  . Alcohol Use: Yes     social    OB History    Grav Para Term Preterm Abortions TAB SAB Ect Mult Living   8 4 4  0 3 3 0 0 0 4      Review of Systems  Neurological: Positive for headaches.  See HPI  Allergies  Review of patient's allergies indicates no known allergies.  Home Medications   Current Outpatient Rx  Name Route Sig Dispense Refill  . ACYCLOVIR 400 MG PO TABS Oral Take 1,200 mg by mouth daily.    Marland Kitchen AMITRIPTYLINE HCL 25 MG PO TABS Oral Take 25 mg by mouth at bedtime.    Marland Kitchen  LURASIDONE HCL 20 MG PO TABS Oral Take 1 tablet by mouth daily.    . OXYCODONE-ACETAMINOPHEN 5-325 MG PO TABS Oral Take 1-2 tablets by mouth every 6 (six) hours as needed for pain. 10 tablet 0  . PENICILLIN V POTASSIUM 500 MG PO TABS Oral Take 1 tablet (500 mg total) by mouth 4 (four) times daily. 28 tablet 0  . PROMETHAZINE HCL 25 MG PO TABS Oral Take 1 tablet (25 mg total) by mouth every 6 (six) hours as needed for nausea. 20 tablet 0    BP 121/76  Pulse 94  Temp 100.7 F (38.2 C) (Oral)  Resp 20  Ht 5\' 3"  (1.6 m)  Wt 140 lb (63.504 kg)  BMI 24.80 kg/m2  SpO2 100%  LMP 02/12/2012  Breastfeeding? No  Physical Exam  Nursing note and vitals reviewed. Constitutional: She is oriented to person, place, and time. She appears well-developed and well-nourished. No distress.  HENT:  Head: Normocephalic and atraumatic.  Right Ear: Tympanic membrane, external ear and ear canal normal.  Left Ear: Tympanic membrane, external ear and ear canal normal.  Nose: Nose normal.  Mouth/Throat: Oral lesions present. Normal dentition. Oropharyngeal exudate present. No tonsillar abscesses.       tonsils enlarged  symmetrically with white exudate. Uvula midline  Neck: Neck supple.  Cardiovascular: Normal rate, regular rhythm and normal heart sounds.   Pulmonary/Chest: Effort normal and breath sounds normal. No respiratory distress. She has no wheezes. She has no rales.  Abdominal: Soft. Bowel sounds are normal. She exhibits no distension. There is no tenderness.  Musculoskeletal: She exhibits no edema.  Lymphadenopathy:    She has no cervical adenopathy.  Neurological: She is alert and oriented to person, place, and time.  Skin: Skin is warm and dry.  Psychiatric: She has a normal mood and affect.    ED Course  Procedures (including critical care time)  Pt febrile, 100.7, she has enlarged tonsils with exudate, no cough. Hx of strep. Will treat for possible strep infection based on [facility] criteria.  Will start on amoxicillin. Tylenol and motrin for fever. Follow up as needed. She is otherwise non toxic, no meningismus, no other complaints.  Filed Vitals:   02/19/12 0947  BP: 121/76  Pulse: 94  Temp: 100.7 F (38.2 C)  Resp: 20    1. Pharyngitis   2. Headache       MDM          Lottie Mussel, PA 02/19/12 1017

## 2012-02-19 NOTE — ED Notes (Signed)
Pt c/o ha and sore throat since two days ago. She took OTC cold medicine, but symptoms persisted and worsened. She now has 9/10 pain to head and throat w/ general body ache and chills.

## 2012-02-20 NOTE — ED Provider Notes (Signed)
Medical screening examination/treatment/procedure(s) were performed by non-physician practitioner and as supervising physician I was immediately available for consultation/collaboration.  Donnetta Hutching, MD 02/20/12 [phone]

## 2012-03-11 ENCOUNTER — Emergency Department (HOSPITAL_COMMUNITY): Payer: Medicaid Other

## 2012-03-11 ENCOUNTER — Encounter (HOSPITAL_COMMUNITY): Payer: Self-pay

## 2012-03-11 ENCOUNTER — Emergency Department (HOSPITAL_COMMUNITY)
Admission: EM | Admit: 2012-03-11 | Discharge: 2012-03-11 | Disposition: A | Discharge status: Home / Self Care | Payer: Medicaid Other | Attending: Emergency Medicine | Admitting: Emergency Medicine

## 2012-03-11 DIAGNOSIS — Z8619 Personal history of other infectious and parasitic diseases: Secondary | ICD-10-CM | POA: Insufficient documentation

## 2012-03-11 DIAGNOSIS — F17200 Nicotine dependence, unspecified, uncomplicated: Secondary | ICD-10-CM | POA: Insufficient documentation

## 2012-03-11 DIAGNOSIS — M25439 Effusion, unspecified wrist: Secondary | ICD-10-CM | POA: Insufficient documentation

## 2012-03-11 DIAGNOSIS — M25539 Pain in unspecified wrist: Secondary | ICD-10-CM | POA: Insufficient documentation

## 2012-03-11 DIAGNOSIS — Z8659 Personal history of other mental and behavioral disorders: Secondary | ICD-10-CM | POA: Insufficient documentation

## 2012-03-11 DIAGNOSIS — Z8669 Personal history of other diseases of the nervous system and sense organs: Secondary | ICD-10-CM | POA: Insufficient documentation

## 2012-03-11 DIAGNOSIS — I10 Essential (primary) hypertension: Secondary | ICD-10-CM | POA: Insufficient documentation

## 2012-03-11 DIAGNOSIS — Z79899 Other long term (current) drug therapy: Secondary | ICD-10-CM | POA: Insufficient documentation

## 2012-03-11 MED ORDER — NAPROXEN 500 MG PO TABS: 500.0000 mg | ORAL_TABLET | Freq: Two times a day (BID) | ORAL | Status: DC

## 2012-03-11 MED ORDER — KETOROLAC TROMETHAMINE 60 MG/2ML IM SOLN
60.0000 mg | Freq: Once | INTRAMUSCULAR | Status: AC
  Administered 2012-03-11: 60 mg via INTRAMUSCULAR
  Filled 2012-03-11: qty 2

## 2012-03-11 NOTE — ED Notes (Signed)
Ortho tech called 

## 2012-03-11 NOTE — Progress Notes (Signed)
Orthopedic Tech Progress Note Patient Details:  Robin Horn Feb 06, 1986 [id number]  Ortho Devices Type of Ortho Device: Velcro wrist splint Ortho Device/Splint Location: (R) UE Ortho Device/Splint Interventions: Application   Jennye Moccasin 03/11/2012, 2:39 PM

## 2012-03-11 NOTE — ED Notes (Signed)
Pt sts she woke up over a week ago with a swollen/ sore right wrist.

## 2012-03-11 NOTE — ED Provider Notes (Signed)
History   This chart was scribed for [name], MD by Shari Heritage and Leone Payor, ER Scribes. The patient was seen in room TR05C/TR05C. Patient's care was started at 1:36PM.     CSN: [id number]  Arrival date & time 03/11/12  1232   First MD Initiated Contact with Patient 03/11/12 1316      Chief Complaint  Patient presents with  . Joint Swelling  . Wrist Pain     Patient is a 26 y.o. female presenting with wrist pain. The history is provided by the patient. No language interpreter was used.  Wrist Pain This is a new problem. The current episode started more than 1 week ago. The problem has been gradually worsening. Pertinent negatives include no headaches. Nothing aggravates the symptoms. Nothing relieves the symptoms. She has tried a cold compress for the symptoms. The treatment provided no relief.    Robin Horn is a 26 y.o. female who presents to the Emergency Department complaining of intermittent, moderate pain on palmar surface of the right wrist with onset over 1 week ago with associated symptoms of swelling. Pt states the pain is radiating from right wrist to the fingers. Pt denies any numbness or weakness. Pt reports using ice to alleviate the pain, with no relief. Pt is an every day smoker and uses alcohol.   Past Medical History  Diagnosis Date  . Abnormal Pap smear of cervix   . Gonorrhea   . Chlamydia   . Herpes   . Pregnant state, incidental   . Migraines   . Depression   . Anxiety   . Hypertension     Past Surgical History  Procedure Date  . Colposcopy   . Dilation and curettage of uterus     Family History  Problem Relation Age of Onset  . Other Neg Hx   . Kidney disease Mother   . Cancer Paternal Grandmother     History  Substance Use Topics  . Smoking status: Current Every Day Smoker -- 1.0 packs/day    Types: Cigarettes  . Smokeless tobacco: Never Used  . Alcohol Use: Yes     Comment: social    OB History    Grav Para Term  Preterm Abortions TAB SAB Ect Mult Living   8 4 4  0 3 3 0 0 0 4      Review of Systems  Constitutional: Negative for fever and chills.  Musculoskeletal: Positive for joint swelling and arthralgias.  Neurological: Negative for weakness, numbness and headaches.    Allergies  Review of patient's allergies indicates no known allergies.  Home Medications   Current Outpatient Rx  Name  Route  Sig  Dispense  Refill  . ACYCLOVIR 400 MG PO TABS   Oral   Take 1,200 mg by mouth daily.         Marland Kitchen AMITRIPTYLINE HCL 25 MG PO TABS   Oral   Take 25 mg by mouth at bedtime.         . IBUPROFEN 600 MG PO TABS   Oral   Take 1 tablet (600 mg total) by mouth every 6 (six) hours as needed for pain.   30 tablet   0   . ADVIL PM PO   Oral   Take 3 tablets by mouth at bedtime as needed.         Marland Kitchen LURASIDONE HCL 20 MG PO TABS   Oral   Take 1 tablet by mouth daily.         Marland Kitchen  PENICILLIN V POTASSIUM 500 MG PO TABS   Oral   Take 1 tablet (500 mg total) by mouth 4 (four) times daily.   28 tablet   0   . TRAMADOL HCL 50 MG PO TABS   Oral   Take 1 tablet (50 mg total) by mouth every 6 (six) hours as needed for pain.   15 tablet   0   . NAPROXEN 500 MG PO TABS   Oral   Take 1 tablet (500 mg total) by mouth 2 (two) times daily.   30 tablet   0     BP 116/79  Pulse 93  Temp 98.6 F (37 C) (Oral)  Resp 18  SpO2 100%  LMP 02/12/2012  Breastfeeding? No  Physical Exam  Nursing note and vitals reviewed. Constitutional: She is oriented to person, place, and time. She appears well-developed and well-nourished. No distress.  HENT:  Head: Normocephalic and atraumatic.  Eyes: EOM are normal.  Neck: Neck supple. No tracheal deviation present.  Cardiovascular: Normal rate.   Pulmonary/Chest: Effort normal. No respiratory distress.  Musculoskeletal: Normal range of motion. She exhibits tenderness.       Tenderness on palmar surface of the right wrist especially with percussion.  Pain radiates from right wrist to fingers. Positive tinnel signs.     Neurological: She is alert and oriented to person, place, and time.  Skin: Skin is warm and dry.  Psychiatric: She has a normal mood and affect. Her behavior is normal.    ED Course  Procedures (including critical care time)  DIAGNOSTIC STUDIES: Oxygen Saturation is 100% on room air, normal by my interpretation.    COORDINATION OF CARE:  2:15 PM Patient informed of current plan for treatment and evaluation and agrees with plan at this time.     Labs Reviewed - No data to display Dg Wrist Complete Right  03/11/2012  *RADIOLOGY REPORT*  Clinical Data: Pain and swelling, no injury.  RIGHT WRIST - COMPLETE 3+ VIEW  Comparison:  None.  Findings:  There is no evidence of fracture or dislocation.  There is no evidence of arthropathy or other focal bone abnormality. Soft tissues demonstrate no radiopaque foreign body but do demonstrate mild edema.  IMPRESSION: Negative for osseous abnormality.   Original Report Authenticated By: Davonna Belling, M.D.      1. Wrist pain       MDM  I personally performed the services described in this documentation, which was scribed in my presence. The recorded information has been reviewed and is accurate.  Possible carpal tunnel syndrome vs strain. Will splint and start anti-inflammatory meds. F/u with hand if no improvement  [name], MD 03/11/12 [phone]

## 2012-03-11 NOTE — Discharge Instructions (Signed)
Wrist Pain       A wrist sprain happens when the bands of tissue that hold the wrist joints together (ligament) stretch too much or tear. A wrist strain happens when muscles or bands of tissue that connect muscles to bones (tendons) are stretched or pulled.   HOME CARE   Put ice on the injured area.   Put ice in a plastic bag.   Place a towel between your skin and the bag.   Leave the ice on for 15 to 20 minutes, 3 to 4 times a day, for the first 2 days.   Raise (elevate) the injured wrist to lessen puffiness (swelling).   Rest the injured wrist for at least 48 hours or as told by your doctor.   Wear a splint, cast, or an elastic wrap as told by your doctor.   Only take medicine as told by your doctor.   Follow up with your doctor as told. This is important.  GET HELP RIGHT AWAY IF:   The fingers are puffy, very red, white, or cold and blue.   The fingers lose feeling (numb) or tingle.   The pain gets worse.   It is hard to move the fingers.  MAKE SURE YOU:   Understand these instructions.   Will watch your condition.   Will get help right away if you are not doing well or get worse.  Document Released: 10/04/2007 Document Revised: 07/10/2011 Document Reviewed: 06/08/2010   [employer] Patient Information 2013 [employer].

## 2012-03-11 NOTE — ED Notes (Signed)
Right wrist swollen. Pt states she has numbness in all of her fingers. No known injury.

## 2012-04-12 ENCOUNTER — Encounter (HOSPITAL_COMMUNITY): Payer: Self-pay

## 2012-04-12 ENCOUNTER — Inpatient Hospital Stay (HOSPITAL_COMMUNITY)
Admission: AD | Admit: 2012-04-12 | Discharge: 2012-04-12 | Disposition: A | Discharge status: Home / Self Care | Payer: Medicaid Other | Source: Ambulatory Visit | Attending: Obstetrics and Gynecology | Admitting: Obstetrics and Gynecology

## 2012-04-12 DIAGNOSIS — K829 Disease of gallbladder, unspecified: Secondary | ICD-10-CM

## 2012-04-12 DIAGNOSIS — R109 Unspecified abdominal pain: Secondary | ICD-10-CM | POA: Insufficient documentation

## 2012-04-12 DIAGNOSIS — M549 Dorsalgia, unspecified: Secondary | ICD-10-CM | POA: Insufficient documentation

## 2012-04-12 DIAGNOSIS — M542 Cervicalgia: Secondary | ICD-10-CM | POA: Insufficient documentation

## 2012-04-12 LAB — COMPREHENSIVE METABOLIC PANEL
AST: 12 U/L (ref 0–37)
Albumin: 3.4 g/dL — ABNORMAL LOW (ref 3.5–5.2)
Alkaline Phosphatase: 80 U/L (ref 39–117)
BUN: 9 mg/dL (ref 6–23)
CO2: 26 mEq/L (ref 19–32)
Chloride: 100 mEq/L (ref 96–112)
Creatinine, Ser: 0.82 mg/dL (ref 0.50–1.10)
GFR calc non Af Amer: >90 mL/min (ref >90)
Potassium: 3.6 mEq/L (ref 3.5–5.1)
Total Bilirubin: 0.1 mg/dL — ABNORMAL LOW (ref 0.3–1.2)

## 2012-04-12 LAB — URINALYSIS, ROUTINE W REFLEX MICROSCOPIC
Bilirubin Urine: NEGATIVE
Glucose, UA: NEGATIVE mg/dL
Hgb urine dipstick: NEGATIVE
Specific Gravity, Urine: 1.015 (ref 1.005–1.030)
pH: 5.5 (ref 5.0–8.0)

## 2012-04-12 LAB — CBC
HCT: 33.5 % — ABNORMAL LOW (ref 36.0–46.0)
MCV: 81.9 fL (ref 78.0–100.0)
RBC: 4.09 MIL/uL (ref 3.87–5.11)
RDW: 14.1 % (ref 11.5–15.5)
WBC: 7 10*3/uL (ref 4.0–10.5)

## 2012-04-12 LAB — LIPASE, BLOOD: Lipase: 24 U/L (ref 11–59)

## 2012-04-12 LAB — POCT PREGNANCY, URINE: Preg Test, Ur: NEGATIVE

## 2012-04-12 MED ORDER — OXYCODONE-ACETAMINOPHEN 5-325 MG PO TABS: 1.0000 | ORAL_TABLET | Freq: Once | ORAL | Status: DC

## 2012-04-12 MED ORDER — FAMOTIDINE 20 MG PO TABS: 20.0000 mg | ORAL_TABLET | Freq: Once | ORAL | Status: DC

## 2012-04-12 MED ORDER — GI COCKTAIL ~~LOC~~
30.0000 mL | Freq: Once | ORAL | Status: AC
  Administered 2012-04-12: 30 mL via ORAL
  Filled 2012-04-12: qty 30

## 2012-04-12 MED ORDER — OXYCODONE-ACETAMINOPHEN 5-325 MG PO TABS
1.0000 | ORAL_TABLET | Freq: Once | ORAL | Status: AC
  Administered 2012-04-12: 1 via ORAL
  Filled 2012-04-12: qty 1

## 2012-04-12 NOTE — MAU Note (Signed)
2 wks ago felt a knot in RLQ. Still have some pain in lower abd but now have pain in upper abd as well. Take 5 Tylenol PM at night to sleep. Also having some neck pain

## 2012-04-12 NOTE — MAU Note (Signed)
Patient is in with c/o upper abdominal pain that radiates to her rlq and back. She also c/o neck pain. This symptoms have been going on for 2 weeks. Patient states that the pain is worse today. She denies any vaginal bleeding or abnormal discharge. No relief from naproxen or ibuprophen.

## 2012-04-12 NOTE — MAU Provider Note (Signed)
  History     CSN: [id number]  Arrival date and time: 04/12/12 1859   First Provider Initiated Contact with Patient 04/12/12 2002      Chief Complaint  Patient presents with  . Back Pain  . Neck Pain  . Abdominal Pain   HPI  Robin Horn is a 26 y.o. who presents today with RUQ pain that radiates to her epigastric area and then to her neck. She states that it has been going on for 2 weeks, and it is worse with she eats eggs, dairy products or greasy foods.    Past Medical History  Diagnosis Date  . Abnormal Pap smear of cervix   . Gonorrhea   . Chlamydia   . Herpes   . Pregnant state, incidental   . Migraines   . Depression   . Anxiety   . Hypertension   . UTI (lower urinary tract infection)     Past Surgical History  Procedure Date  . Colposcopy   . Dilation and curettage of uterus     Family History  Problem Relation Age of Onset  . Other Neg Hx   . Kidney disease Mother   . Cancer Paternal Grandmother     History  Substance Use Topics  . Smoking status: Current Every Day Smoker -- 1.0 packs/day    Types: Cigarettes  . Smokeless tobacco: Never Used  . Alcohol Use: Yes     Comment: social    Allergies: No Known Allergies  Prescriptions prior to admission  Medication Sig Dispense Refill  . acyclovir (ZOVIRAX) 400 MG tablet Take 1,200 mg by mouth daily.      . Ibuprofen-Diphenhydramine Cit (ADVIL PM PO) Take 3 tablets by mouth at bedtime as needed. For sleep        Review of Systems  Constitutional: Negative for fever and chills.  Respiratory: Negative for cough.   Cardiovascular: Negative for chest pain.  Gastrointestinal: Positive for nausea, abdominal pain and diarrhea (X 1week. 5-6 soft/watery stools. ). Negative for vomiting, constipation and blood in stool.  Genitourinary: Negative for dysuria, urgency and frequency.  Neurological: Negative for dizziness.   Physical Exam   Blood pressure 132/78, pulse 100, temperature 98.2 F (36.8  C), temperature source Oral, resp. rate 20, height 5\' 3"  (1.6 m), weight 62.687 kg (138 lb 3.2 oz), last menstrual period 03/31/2012, unknown if currently breastfeeding.  Physical Exam  Nursing note and vitals reviewed. Constitutional: She appears well-developed and well-nourished. No distress.  Cardiovascular: Normal rate.   Respiratory: Effort normal and breath sounds normal.  GI: Soft. She exhibits no distension. There is tenderness (Tender in the RUQ). There is no rebound and no guarding.  Neurological: She is alert.  Skin: Skin is warm and dry.  Psychiatric: She has a normal mood and affect.    MAU Course  Procedures  States that the GI cocktail did not help her pain very much. She noticed some improvement, but not complete relief She just ate prior to arrival. We will schedule her for an out patient GI ultrasound.  Assessment and Plan   1. Gallbladder pain    Schedule outpatient GI ultrasound Start low fat diet   Tawnya Crook 04/12/2012, 8:06 PM

## 2012-04-16 NOTE — MAU Provider Note (Signed)
Attestation of Attending Supervision of Advanced Practitioner (CNM/NP): Evaluation and management procedures were performed by the Advanced Practitioner under my supervision and collaboration.  I have reviewed the Advanced Practitioner's note and chart, and I agree with the management and plan.  Laquisha Northcraft 04/16/2012 6:02 PM

## 2012-04-19 ENCOUNTER — Ambulatory Visit (HOSPITAL_COMMUNITY)
Admission: RE | Admit: 2012-04-19 | Discharge: 2012-04-19 | Disposition: A | Discharge status: Home / Self Care | Payer: Medicaid Other | Source: Ambulatory Visit | Attending: Advanced Practice Midwife | Admitting: Advanced Practice Midwife

## 2012-04-19 ENCOUNTER — Inpatient Hospital Stay (HOSPITAL_COMMUNITY)
Admission: AD | Admit: 2012-04-19 | Discharge: 2012-04-19 | Disposition: A | Discharge status: Home / Self Care | Payer: Medicaid Other | Source: Ambulatory Visit | Attending: Obstetrics & Gynecology | Admitting: Obstetrics & Gynecology

## 2012-04-19 DIAGNOSIS — Q619 Cystic kidney disease, unspecified: Secondary | ICD-10-CM | POA: Insufficient documentation

## 2012-04-19 DIAGNOSIS — R1011 Right upper quadrant pain: Secondary | ICD-10-CM | POA: Insufficient documentation

## 2012-04-19 DIAGNOSIS — R109 Unspecified abdominal pain: Secondary | ICD-10-CM | POA: Insufficient documentation

## 2012-04-19 DIAGNOSIS — K829 Disease of gallbladder, unspecified: Secondary | ICD-10-CM | POA: Insufficient documentation

## 2012-04-19 NOTE — MAU Note (Signed)
Pt here for f/u u/s only.

## 2012-04-19 NOTE — Progress Notes (Signed)
S:  Pt is here for follow-up on abdominal ultrasound due to RUQ pain.  Pt denies abdominal pain at this time, however experiences pain shortly after eating.  O:   Filed Vitals:   04/19/12 0948  BP: 116/77  Pulse: 74  Temp: 97.4 F (36.3 C)  Resp: 16   General - Alert and oriented x 3, NAD  Ultrasound -  IMPRESSION:  1. No acute findings.  2. Bilateral renal cysts.   A: Resolved Abdominal Pain Renal Cysts  P: DC to home  Reviewed ultrasound with ER MD, Dr. Adriana Simas, usually no plan of care needed today, however may refer patient to urology for follow-up. Discussed with patient importance of diet changes [facility]

## 2012-06-17 ENCOUNTER — Encounter (HOSPITAL_COMMUNITY): Payer: Self-pay | Admitting: Emergency Medicine

## 2012-06-17 ENCOUNTER — Emergency Department (HOSPITAL_COMMUNITY)
Admission: EM | Admit: 2012-06-17 | Discharge: 2012-06-17 | Disposition: A | Discharge status: Home / Self Care | Payer: Medicaid Other | Attending: Emergency Medicine | Admitting: Emergency Medicine

## 2012-06-17 DIAGNOSIS — H9209 Otalgia, unspecified ear: Secondary | ICD-10-CM | POA: Insufficient documentation

## 2012-06-17 DIAGNOSIS — R197 Diarrhea, unspecified: Secondary | ICD-10-CM | POA: Insufficient documentation

## 2012-06-17 DIAGNOSIS — Z8659 Personal history of other mental and behavioral disorders: Secondary | ICD-10-CM | POA: Insufficient documentation

## 2012-06-17 DIAGNOSIS — Z8619 Personal history of other infectious and parasitic diseases: Secondary | ICD-10-CM | POA: Insufficient documentation

## 2012-06-17 DIAGNOSIS — I10 Essential (primary) hypertension: Secondary | ICD-10-CM | POA: Insufficient documentation

## 2012-06-17 DIAGNOSIS — R5381 Other malaise: Secondary | ICD-10-CM | POA: Insufficient documentation

## 2012-06-17 DIAGNOSIS — R059 Cough, unspecified: Secondary | ICD-10-CM | POA: Insufficient documentation

## 2012-06-17 DIAGNOSIS — R05 Cough: Secondary | ICD-10-CM | POA: Insufficient documentation

## 2012-06-17 DIAGNOSIS — Z3202 Encounter for pregnancy test, result negative: Secondary | ICD-10-CM | POA: Insufficient documentation

## 2012-06-17 DIAGNOSIS — B9789 Other viral agents as the cause of diseases classified elsewhere: Secondary | ICD-10-CM | POA: Insufficient documentation

## 2012-06-17 DIAGNOSIS — Z87440 Personal history of urinary (tract) infections: Secondary | ICD-10-CM | POA: Insufficient documentation

## 2012-06-17 DIAGNOSIS — R110 Nausea: Secondary | ICD-10-CM | POA: Insufficient documentation

## 2012-06-17 DIAGNOSIS — B349 Viral infection, unspecified: Secondary | ICD-10-CM

## 2012-06-17 DIAGNOSIS — F17200 Nicotine dependence, unspecified, uncomplicated: Secondary | ICD-10-CM | POA: Insufficient documentation

## 2012-06-17 DIAGNOSIS — J029 Acute pharyngitis, unspecified: Secondary | ICD-10-CM | POA: Insufficient documentation

## 2012-06-17 DIAGNOSIS — Z8679 Personal history of other diseases of the circulatory system: Secondary | ICD-10-CM | POA: Insufficient documentation

## 2012-06-17 LAB — COMPREHENSIVE METABOLIC PANEL
ALT: 16 U/L (ref 0–35)
Alkaline Phosphatase: 71 U/L (ref 39–117)
CO2: 28 mEq/L (ref 19–32)
Chloride: 103 mEq/L (ref 96–112)
GFR calc Af Amer: >90 mL/min (ref >90)
GFR calc non Af Amer: 87 mL/min — ABNORMAL LOW (ref >90)
Glucose, Bld: 92 mg/dL (ref 70–99)
Potassium: 3.6 mEq/L (ref 3.5–5.1)
Sodium: 141 mEq/L (ref 135–145)

## 2012-06-17 LAB — CBC WITH DIFFERENTIAL/PLATELET
Basophils Relative: 0 % (ref 0–1)
Eosinophils Relative: 0 % (ref 0–5)
HCT: 38.3 % (ref 36.0–46.0)
MCHC: 32.4 g/dL (ref 30.0–36.0)
MCV: 84.5 fL (ref 78.0–100.0)
Monocytes Absolute: 0.8 10*3/uL (ref 0.1–1.0)
Platelets: 237 10*3/uL (ref 150–400)
RDW: 15.3 % (ref 11.5–15.5)
WBC: 7.5 10*3/uL (ref 4.0–10.5)

## 2012-06-17 LAB — URINALYSIS, ROUTINE W REFLEX MICROSCOPIC
Glucose, UA: NEGATIVE mg/dL
Hgb urine dipstick: NEGATIVE
Specific Gravity, Urine: 1.027 (ref 1.005–1.030)
pH: 8.5 — ABNORMAL HIGH (ref 5.0–8.0)

## 2012-06-17 NOTE — ED Provider Notes (Signed)
History     CSN: [id number]  Arrival date & time 06/17/12  1308   First MD Initiated Contact with Patient 06/17/12 2017      Chief Complaint  Patient presents with  . Generalized Body Aches    (Consider location/radiation/quality/duration/timing/severity/associated sxs/prior treatment) The history is provided by the patient.   patient here complaining of body aches and fatigue. Some nausea without vomiting. Watery diarrhea noted as well 2. Patient is able to eat pizza and drink soft drinks. Some ear pain sore throat. No fevers or rashes. Nonproductive cough as well 2. Has used over-the-counter medications without relief. Nothing makes her symptoms better worse  Past Medical History  Diagnosis Date  . Abnormal Pap smear of cervix   . Gonorrhea   . Chlamydia   . Herpes   . Pregnant state, incidental   . Migraines   . Depression   . Anxiety   . Hypertension   . UTI (lower urinary tract infection)     Past Surgical History  Procedure Laterality Date  . Colposcopy    . Dilation and curettage of uterus      Family History  Problem Relation Age of Onset  . Other Neg Hx   . Kidney disease Mother   . Cancer Paternal Grandmother     History  Substance Use Topics  . Smoking status: Current Every Day Smoker -- 1.00 packs/day    Types: Cigarettes  . Smokeless tobacco: Never Used  . Alcohol Use: Yes     Comment: social    OB History   Grav Para Term Preterm Abortions TAB SAB Ect Mult Living   7 4 4  0 3 3 0 0 0 4      Review of Systems  All other systems reviewed and are negative.    Allergies  Review of patient's allergies indicates no known allergies.  Home Medications   Current Outpatient Rx  Name  Route  Sig  Dispense  Refill  . Phenyleph-Doxylamine-DM-APAP (ALKA-SELTZER PLS NIGHT CLD/FLU PO)   Oral   Take 2 tablets by mouth every 6 (six) hours as needed (for cold symptoms).           BP 136/89  Pulse 105  Temp(Src) 99.4 F (37.4 C) (Oral)   SpO2 100%  LMP 05/29/2012  Physical Exam  Nursing note and vitals reviewed. Constitutional: She is oriented to person, place, and time. She appears well-developed and well-nourished.  Non-toxic appearance. No distress.  HENT:  Head: Normocephalic and atraumatic.  Eyes: Conjunctivae, EOM and lids are normal. Pupils are equal, round, and reactive to light.  Neck: Normal range of motion. Neck supple. No tracheal deviation present. No mass present.  Cardiovascular: Normal rate, regular rhythm and normal heart sounds.  Exam reveals no gallop.   No murmur heard. Pulmonary/Chest: Effort normal and breath sounds normal. No stridor. No respiratory distress. She has no decreased breath sounds. She has no wheezes. She has no rhonchi. She has no rales.  Abdominal: Soft. Normal appearance and bowel sounds are normal. She exhibits no distension. There is no tenderness. There is no rebound and no CVA tenderness.  Musculoskeletal: Normal range of motion. She exhibits no edema and no tenderness.  Neurological: She is alert and oriented to person, place, and time. She has normal strength. No cranial nerve deficit or sensory deficit. GCS eye subscore is 4. GCS verbal subscore is 5. GCS motor subscore is 6.  Skin: Skin is warm and dry. No abrasion and no rash noted.  Psychiatric: She has a normal mood and affect. Her speech is normal and behavior is normal.    ED Course  Procedures (including critical care time)  Labs Reviewed  CBC WITH DIFFERENTIAL  URINALYSIS, ROUTINE W REFLEX MICROSCOPIC  COMPREHENSIVE METABOLIC PANEL  POCT PREGNANCY, URINE   No results found.   No diagnosis found.    MDM  Pt with likely viral illness--stable for d/c       Toy Baker, MD 06/17/12 2223

## 2012-06-17 NOTE — ED Notes (Signed)
Pt c/o body aches, fatigue, and just hasn't felt well.  Nausea without vomiting.  Diarrhea onset 2 days ago.  Also st's she may be preg.

## 2012-06-20 ENCOUNTER — Emergency Department (HOSPITAL_COMMUNITY)
Admission: EM | Admit: 2012-06-20 | Discharge: 2012-06-21 | Disposition: A | Discharge status: Home / Self Care | Payer: Medicaid Other | Attending: Emergency Medicine | Admitting: Emergency Medicine

## 2012-06-20 ENCOUNTER — Encounter (HOSPITAL_COMMUNITY): Payer: Self-pay | Admitting: Emergency Medicine

## 2012-06-20 DIAGNOSIS — IMO0001 Reserved for inherently not codable concepts without codable children: Secondary | ICD-10-CM | POA: Insufficient documentation

## 2012-06-20 DIAGNOSIS — J020 Streptococcal pharyngitis: Secondary | ICD-10-CM | POA: Insufficient documentation

## 2012-06-20 DIAGNOSIS — R0602 Shortness of breath: Secondary | ICD-10-CM | POA: Insufficient documentation

## 2012-06-20 DIAGNOSIS — Z79899 Other long term (current) drug therapy: Secondary | ICD-10-CM | POA: Insufficient documentation

## 2012-06-20 DIAGNOSIS — F3289 Other specified depressive episodes: Secondary | ICD-10-CM | POA: Insufficient documentation

## 2012-06-20 DIAGNOSIS — R599 Enlarged lymph nodes, unspecified: Secondary | ICD-10-CM | POA: Insufficient documentation

## 2012-06-20 DIAGNOSIS — F17200 Nicotine dependence, unspecified, uncomplicated: Secondary | ICD-10-CM | POA: Insufficient documentation

## 2012-06-20 DIAGNOSIS — R1310 Dysphagia, unspecified: Secondary | ICD-10-CM | POA: Insufficient documentation

## 2012-06-20 DIAGNOSIS — R4789 Other speech disturbances: Secondary | ICD-10-CM | POA: Insufficient documentation

## 2012-06-20 DIAGNOSIS — I10 Essential (primary) hypertension: Secondary | ICD-10-CM | POA: Insufficient documentation

## 2012-06-20 DIAGNOSIS — Z8619 Personal history of other infectious and parasitic diseases: Secondary | ICD-10-CM | POA: Insufficient documentation

## 2012-06-20 DIAGNOSIS — E860 Dehydration: Secondary | ICD-10-CM | POA: Insufficient documentation

## 2012-06-20 DIAGNOSIS — R509 Fever, unspecified: Secondary | ICD-10-CM | POA: Insufficient documentation

## 2012-06-20 DIAGNOSIS — B009 Herpesviral infection, unspecified: Secondary | ICD-10-CM | POA: Insufficient documentation

## 2012-06-20 DIAGNOSIS — G43909 Migraine, unspecified, not intractable, without status migrainosus: Secondary | ICD-10-CM | POA: Insufficient documentation

## 2012-06-20 DIAGNOSIS — F411 Generalized anxiety disorder: Secondary | ICD-10-CM | POA: Insufficient documentation

## 2012-06-20 DIAGNOSIS — Z87440 Personal history of urinary (tract) infections: Secondary | ICD-10-CM | POA: Insufficient documentation

## 2012-06-20 MED ORDER — HYDROCODONE-ACETAMINOPHEN 7.5-325 MG/15ML PO SOLN
5.0000 mg | Freq: Once | ORAL | Status: AC
  Administered 2012-06-20: 5 mg via ORAL
  Filled 2012-06-20: qty 15

## 2012-06-20 MED ORDER — DEXAMETHASONE SODIUM PHOSPHATE 10 MG/ML IJ SOLN
10.0000 mg | Freq: Once | INTRAMUSCULAR | Status: AC
  Administered 2012-06-20: 10 mg via INTRAVENOUS
  Filled 2012-06-20: qty 1

## 2012-06-20 MED ORDER — HYDROCODONE-HOMATROPINE 5-1.5 MG/5ML PO SYRP: 5.0000 mL | ORAL_SOLUTION | Freq: Four times a day (QID) | ORAL | Status: DC | PRN

## 2012-06-20 MED ORDER — PENICILLIN G BENZATHINE 1200000 UNIT/2ML IM SUSP
1.2000 10*6.[IU] | Freq: Once | INTRAMUSCULAR | Status: AC
  Administered 2012-06-20: 1.2 10*6.[IU] via INTRAMUSCULAR
  Filled 2012-06-20 (×2): qty 2

## 2012-06-20 NOTE — ED Notes (Signed)
Pt states on Monday she woke up with a sore throat and then started having a hard time breathing  Pt states she went to [facility] on Tuesday for same was told she had a viral infection  Pt states no meds were given  Pt states she was not checked for strep throat or anything  Pt states today she is having pain in her chest describes as a tightness  Pt states has not been coughing much now but was on the first couple days producing yellow sputum  Pt states she has also noticed her stools have been black in color

## 2012-06-20 NOTE — ED Provider Notes (Signed)
Medical screening examination/treatment/procedure(s) were performed by non-physician practitioner and as supervising physician I was immediately available for consultation/collaboration. Devoria Albe, MD, Armando Gang   Ward Givens, MD 06/20/12 [phone]

## 2012-06-20 NOTE — ED Provider Notes (Signed)
History     CSN: [id number]  Arrival date & time 06/20/12  2027   First MD Initiated Contact with Patient 06/20/12 2133      Chief Complaint  Patient presents with  . Shortness of Breath  . Sore Throat    (Consider location/radiation/quality/duration/timing/severity/associated sxs/prior treatment) HPI Comments: 27 yo AA female presents with sore throat and shortness of breath x 4 days. She was seen 3 days ago (06/17/2012) at [facility] ED due to sore throat and nausea and was diagnosed with a viral infection. No meds were prescribed. The sore throat has worsened in quality and associated with dysphasia. She rates the pain as a 7/10 in severity and notes dysphagia. She has a previous history of strep pharyngitis. There are no aggravating or relieving factors. She also describes intermittent shortness of breath. She also notes fever, chills and myalgias. She has no known exposure to illness and denies abdominal pain, nausea and vomiting.   Patient is a 26 y.o. female presenting with shortness of breath and pharyngitis. The history is provided by the patient and medical records.  Shortness of Breath Associated symptoms: sore throat   Associated symptoms: no cough, no diaphoresis, no fever, no headaches and no neck pain   Sore Throat Associated symptoms include a sore throat. Pertinent negatives include no congestion, coughing, diaphoresis, fever, headaches, myalgias or neck pain.    Past Medical History  Diagnosis Date  . Abnormal Pap smear of cervix   . Gonorrhea   . Chlamydia   . Herpes   . Pregnant state, incidental   . Migraines   . Depression   . Anxiety   . Hypertension   . UTI (lower urinary tract infection)     Past Surgical History  Procedure Laterality Date  . Colposcopy    . Dilation and curettage of uterus      Family History  Problem Relation Age of Onset  . Other Neg Hx   . Kidney disease Mother   . Cancer Paternal Grandmother     History  Substance Use  Topics  . Smoking status: Current Every Day Smoker -- 1.00 packs/day    Types: Cigarettes  . Smokeless tobacco: Never Used  . Alcohol Use: Yes     Comment: social    OB History   Grav Para Term Preterm Abortions TAB SAB Ect Mult Living   7 4 4  0 3 3 0 0 0 4      Review of Systems  Constitutional: Negative for fever, diaphoresis and activity change.  HENT: Positive for sore throat. Negative for congestion, drooling, trouble swallowing, neck pain and voice change.   Respiratory: Positive for shortness of breath. Negative for cough.   Genitourinary: Negative for dysuria.  Musculoskeletal: Negative for myalgias.  Skin: Negative for color change and wound.  Neurological: Negative for headaches.  All other systems reviewed and are negative.    Allergies  Review of patient's allergies indicates no known allergies.  Home Medications   Current Outpatient Rx  Name  Route  Sig  Dispense  Refill  . amitriptyline (ELAVIL) 50 MG tablet   Oral   Take 50 mg by mouth at bedtime.         . divalproex (DEPAKOTE ER) 500 MG 24 hr tablet   Oral   Take 1,000 mg by mouth at bedtime.         . gabapentin (NEURONTIN) 300 MG capsule   Oral   Take 300 mg by mouth 3 (three)  times daily.         . prazosin (MINIPRESS) 2 MG capsule   Oral   Take 2 mg by mouth at bedtime.         . valACYclovir (VALTREX) 1000 MG tablet   Oral   Take 1,000 mg by mouth daily.         Marland Kitchen Phenyleph-Doxylamine-DM-APAP (ALKA-SELTZER PLS NIGHT CLD/FLU PO)   Oral   Take 2 tablets by mouth every 6 (six) hours as needed (for cold symptoms).           BP 117/77  Pulse 98  Temp(Src) 99.8 F (37.7 C) (Oral)  Resp 20  SpO2 100%  LMP 06/19/2012  Physical Exam  Nursing note and vitals reviewed. Constitutional: She is oriented to person, place, and time. She appears well-developed and well-nourished. No distress.  HENT:  Head: Normocephalic and atraumatic. No trismus in the jaw.  Right Ear: Tympanic  membrane, external ear and ear canal normal.  Left Ear: Tympanic membrane, external ear and ear canal normal.  Nose: Nose normal. No rhinorrhea. Right sinus exhibits no maxillary sinus tenderness and no frontal sinus tenderness. Left sinus exhibits no maxillary sinus tenderness and no frontal sinus tenderness.  Mouth/Throat: Uvula is midline and mucous membranes are normal. Normal dentition. No dental abscesses or edematous. Oropharyngeal exudate and posterior oropharyngeal edema present. No posterior oropharyngeal erythema or tonsillar abscesses.  No submental edema, tongue not elevated, no trismus. No impending airway obstruction; Pt able to speak full sentences, swallow intact, no drooling, stridor, or tonsillar/uvula displacement. No palatal petechia  Eyes: Conjunctivae are normal.  Neck: Trachea normal, normal range of motion and full passive range of motion without pain. Neck supple. No rigidity. Normal range of motion present. No Brudzinski's sign noted.  Flexion and extension of neck without pain or difficulty. Able to breath without difficulty in extension.  Cardiovascular: Normal rate and regular rhythm.   Pulmonary/Chest: Effort normal and breath sounds normal. No stridor. No respiratory distress. She has no wheezes.  Abdominal: Soft. There is no tenderness.  No obvious evidence of splenomegaly. Non ttp.   Musculoskeletal: Normal range of motion.  Lymphadenopathy:       Head (right side): No preauricular and no posterior auricular adenopathy present.       Head (left side): No preauricular and no posterior auricular adenopathy present.    She has cervical adenopathy.  Neurological: She is alert and oriented to person, place, and time.  Skin: Skin is warm and dry. No rash noted. She is not diaphoretic.  Psychiatric: She has a normal mood and affect.    ED Course  Procedures (including critical care time)  Labs Reviewed - No data to display No results found.   No diagnosis  found.    MDM  Pt febrile with tonsillar exudate, cervical lymphadenopathy, & dysphagia; diagnosis of strep. Treated in the Ed with steroids, Pain medication and PCN IM.  Pt appears mildly dehydrated, discussed importance of water rehydration. Presentation non concerning for PTA or infxn spread to soft tissue. No trismus or uvula deviation. Specific return precautions discussed. Pt able to drink water in ED without difficulty with intact air way. Recommended PCP follow up.          Jaci Carrel, [place] 06/20/12 2320

## 2012-07-19 ENCOUNTER — Emergency Department (HOSPITAL_COMMUNITY)
Admission: EM | Admit: 2012-07-19 | Discharge: 2012-07-19 | Disposition: A | Discharge status: Home / Self Care | Payer: Medicaid Other | Attending: Emergency Medicine | Admitting: Emergency Medicine

## 2012-07-19 ENCOUNTER — Encounter (HOSPITAL_COMMUNITY): Payer: Self-pay | Admitting: Emergency Medicine

## 2012-07-19 DIAGNOSIS — Z79899 Other long term (current) drug therapy: Secondary | ICD-10-CM | POA: Insufficient documentation

## 2012-07-19 DIAGNOSIS — Z8679 Personal history of other diseases of the circulatory system: Secondary | ICD-10-CM | POA: Insufficient documentation

## 2012-07-19 DIAGNOSIS — Z8619 Personal history of other infectious and parasitic diseases: Secondary | ICD-10-CM | POA: Insufficient documentation

## 2012-07-19 DIAGNOSIS — F17200 Nicotine dependence, unspecified, uncomplicated: Secondary | ICD-10-CM | POA: Insufficient documentation

## 2012-07-19 DIAGNOSIS — F329 Major depressive disorder, single episode, unspecified: Secondary | ICD-10-CM | POA: Insufficient documentation

## 2012-07-19 DIAGNOSIS — K120 Recurrent oral aphthae: Secondary | ICD-10-CM | POA: Insufficient documentation

## 2012-07-19 DIAGNOSIS — F3289 Other specified depressive episodes: Secondary | ICD-10-CM | POA: Insufficient documentation

## 2012-07-19 DIAGNOSIS — F411 Generalized anxiety disorder: Secondary | ICD-10-CM | POA: Insufficient documentation

## 2012-07-19 DIAGNOSIS — K029 Dental caries, unspecified: Secondary | ICD-10-CM | POA: Insufficient documentation

## 2012-07-19 DIAGNOSIS — Z87440 Personal history of urinary (tract) infections: Secondary | ICD-10-CM | POA: Insufficient documentation

## 2012-07-19 HISTORY — DX: Unspecified pre-eclampsia, unspecified trimester: O14.90

## 2012-07-19 MED ORDER — IBUPROFEN 800 MG PO TABS: 800.0000 mg | ORAL_TABLET | Freq: Three times a day (TID) | ORAL | Status: DC

## 2012-07-19 MED ORDER — HYDROCODONE-ACETAMINOPHEN 5-325 MG PO TABS: 1.0000 | ORAL_TABLET | Freq: Four times a day (QID) | ORAL | Status: DC | PRN

## 2012-07-19 MED ORDER — AMOXICILLIN 500 MG PO CAPS: 500.0000 mg | ORAL_CAPSULE | Freq: Three times a day (TID) | ORAL | Status: DC

## 2012-07-19 NOTE — ED Provider Notes (Signed)
History  This chart was scribed for non-physician practitioner Jaynie Crumble, PA-C working with Nelia Shi, MD, by Candelaria Stagers, ED Scribe. This patient was seen in room TR04C/TR04C and the patient's care was started at 8:40 PM   CSN: [id number]  Arrival date & time 07/19/12  7829   First MD Initiated Contact with Patient 07/19/12 2037      Chief Complaint  Patient presents with  . Dental Pain     The history is provided by the patient. No language interpreter was used.   Robin Horn is a 27 y.o. female who presents to the Emergency Department complaining of sudden onset of right lower dental pain that started earlier today.  Eating makes the pain worse.   Pt reports she has not been able to eat, but has been drinking normally.  She has taken 800 mg ibuprofen with no relief.  She denies fever, chills, ear pain, nausea, or vomiting.  Pt has called her dentist today, but the office is closed.  She has applied OTC pain relieving gel with no improvement.   Pt has experienced these symptoms before.     Past Medical History  Diagnosis Date  . Abnormal Pap smear of cervix   . Gonorrhea   . Chlamydia   . Herpes   . Pregnant state, incidental   . Migraines   . Depression   . Anxiety   . UTI (lower urinary tract infection)   . Preeclampsia     Past Surgical History  Procedure Laterality Date  . Colposcopy    . Dilation and curettage of uterus      Family History  Problem Relation Age of Onset  . Other Neg Hx   . Kidney disease Mother   . Cancer Paternal Grandmother     History  Substance Use Topics  . Smoking status: Current Every Day Smoker -- 1.00 packs/day    Types: Cigarettes  . Smokeless tobacco: Never Used  . Alcohol Use: Yes     Comment: social    OB History   Grav Para Term Preterm Abortions TAB SAB Ect Mult Living   7 4 4  0 3 3 0 0 0 4      Review of Systems  Constitutional: Negative for fever and chills.  HENT: Positive for dental  problem (right lower dental pain).   All other systems reviewed and are negative.    Allergies  Review of patient's allergies indicates no known allergies.  Home Medications   Current Outpatient Rx  Name  Route  Sig  Dispense  Refill  . amitriptyline (ELAVIL) 50 MG tablet   Oral   Take 50 mg by mouth at bedtime.         . divalproex (DEPAKOTE ER) 500 MG 24 hr tablet   Oral   Take 1,000 mg by mouth at bedtime.         . gabapentin (NEURONTIN) 300 MG capsule   Oral   Take 300 mg by mouth 3 (three) times daily.         Marland Kitchen HYDROcodone-homatropine (HYCODAN) 5-1.5 MG/5ML syrup   Oral   Take 5 mLs by mouth every 6 (six) hours as needed for cough.   120 mL   0   . Phenyleph-Doxylamine-DM-APAP (ALKA-SELTZER PLS NIGHT CLD/FLU PO)   Oral   Take 2 tablets by mouth every 6 (six) hours as needed (for cold symptoms).         . prazosin (MINIPRESS) 2 MG capsule  Oral   Take 2 mg by mouth at bedtime.         . valACYclovir (VALTREX) 1000 MG tablet   Oral   Take 1,000 mg by mouth daily.           BP 131/83  Pulse 93  Temp(Src) 98.7 F (37.1 C) (Oral)  Resp 18  SpO2 100%  LMP 06/19/2012  Physical Exam  Nursing note and vitals reviewed. Constitutional: She is oriented to person, place, and time. She appears well-developed and well-nourished. No distress.  HENT:  Head: Normocephalic and atraumatic.  Right Ear: Tympanic membrane normal.  Left Ear: Tympanic membrane normal.  4mm ulcer to the right check.  Oral mucosa tender.  No swelling to gum.  Widespread caries.      Eyes: EOM are normal.  Neck: Normal range of motion. Neck supple. No tracheal deviation present.  Cardiovascular: Normal rate.   Pulmonary/Chest: Effort normal. No respiratory distress.  Musculoskeletal: Normal range of motion.  Neurological: She is alert and oriented to person, place, and time.  Skin: Skin is warm and dry.  Psychiatric: She has a normal mood and affect. Her behavior is normal.     ED Course  Procedures   DIAGNOSTIC STUDIES: Oxygen Saturation is 100% on room air, normal by my interpretation.    COORDINATION OF CARE:  8:46 PM Discussed course of care with pt which includes pain medication and antibiotics to use only if symptoms worsen.  Advised pt to follow up with dentist if symptoms worsen.  Pt understands and agrees.    Labs Reviewed - No data to display No results found.   1. Aphthous stomatitis   2. Pain due to dental caries       MDM    Pt with aphthous ulcer and carries in her teeth. No signs of an abscess at this time. Will treat with pain medicaitons, norco, ibuprofen. Given prescription for amoxil, instructed not to fill unless not improving. Pt voiced understanding. Follow up with a dentist.   Filed Vitals:   07/19/12 2106  BP: 131/88  Pulse: 84  Temp:   Resp: 16   I personally performed the services described in this documentation, which was scribed in my presence. The recorded information has been reviewed and is accurate.      Lottie Mussel, PA-C 07/20/12 0007

## 2012-07-19 NOTE — ED Notes (Signed)
Wisdom teeth removed one month ago and seen Dentist one week ago and had cavities filled.   Within this past week right side face swelling and dental pain.

## 2012-07-20 NOTE — ED Provider Notes (Signed)
Medical screening examination/treatment/procedure(s) were performed by non-physician practitioner and as supervising physician I was immediately available for consultation/collaboration.   Nelia Shi, MD 07/20/12 1224

## 2012-10-03 ENCOUNTER — Encounter (HOSPITAL_COMMUNITY): Payer: Self-pay | Admitting: Emergency Medicine

## 2012-10-03 ENCOUNTER — Emergency Department (HOSPITAL_COMMUNITY)
Admission: EM | Admit: 2012-10-03 | Discharge: 2012-10-03 | Disposition: A | Discharge status: Home / Self Care | Payer: Medicaid Other | Attending: Emergency Medicine | Admitting: Emergency Medicine

## 2012-10-03 DIAGNOSIS — A6000 Herpesviral infection of urogenital system, unspecified: Secondary | ICD-10-CM | POA: Insufficient documentation

## 2012-10-03 DIAGNOSIS — Z8669 Personal history of other diseases of the nervous system and sense organs: Secondary | ICD-10-CM | POA: Insufficient documentation

## 2012-10-03 DIAGNOSIS — R51 Headache: Secondary | ICD-10-CM | POA: Insufficient documentation

## 2012-10-03 DIAGNOSIS — Z8619 Personal history of other infectious and parasitic diseases: Secondary | ICD-10-CM | POA: Insufficient documentation

## 2012-10-03 DIAGNOSIS — J029 Acute pharyngitis, unspecified: Secondary | ICD-10-CM | POA: Insufficient documentation

## 2012-10-03 DIAGNOSIS — F329 Major depressive disorder, single episode, unspecified: Secondary | ICD-10-CM | POA: Insufficient documentation

## 2012-10-03 DIAGNOSIS — Z79899 Other long term (current) drug therapy: Secondary | ICD-10-CM | POA: Insufficient documentation

## 2012-10-03 DIAGNOSIS — F411 Generalized anxiety disorder: Secondary | ICD-10-CM | POA: Insufficient documentation

## 2012-10-03 DIAGNOSIS — F3289 Other specified depressive episodes: Secondary | ICD-10-CM | POA: Insufficient documentation

## 2012-10-03 DIAGNOSIS — Z87440 Personal history of urinary (tract) infections: Secondary | ICD-10-CM | POA: Insufficient documentation

## 2012-10-03 DIAGNOSIS — F17200 Nicotine dependence, unspecified, uncomplicated: Secondary | ICD-10-CM | POA: Insufficient documentation

## 2012-10-03 MED ORDER — IBUPROFEN 600 MG PO TABS: 600.0000 mg | ORAL_TABLET | Freq: Three times a day (TID) | ORAL | Status: DC | PRN

## 2012-10-03 MED ORDER — CLINDAMYCIN HCL 150 MG PO CAPS: 300.0000 mg | ORAL_CAPSULE | Freq: Three times a day (TID) | ORAL | Status: DC

## 2012-10-03 MED ORDER — OXYCODONE-ACETAMINOPHEN 5-325 MG PO TABS
1.0000 | ORAL_TABLET | Freq: Once | ORAL | Status: AC
  Administered 2012-10-03: 1 via ORAL
  Filled 2012-10-03: qty 1

## 2012-10-03 MED ORDER — HYDROCODONE-ACETAMINOPHEN 5-325 MG PO TABS: 1.0000 | ORAL_TABLET | ORAL | Status: DC | PRN

## 2012-10-03 NOTE — ED Provider Notes (Signed)
History     CSN: [id number]  Arrival date & time 10/03/12  0840   First MD Initiated Contact with Patient 10/03/12 [phone]      Chief Complaint  Patient presents with  . Sore Throat  . Fever    HPI Patient presents with sore throat and headache as well as chills over the past 3 days.  She has been able to eat and drink although this is somewhat decreased.  No nausea or vomiting.  No neck pain or neck stiffness.  No tongue swelling.  No dental pain.  Her symptoms are mild to moderate in severity.  Her symptoms are worsened by swallowing.  She has not been on antibiotics.  She reports recurrent throat infections over the past several months.  She's never been referred to ENT.  No difficulty breathing   Past Medical History  Diagnosis Date  . Abnormal Pap smear of cervix   . Gonorrhea   . Chlamydia   . Herpes   . Pregnant state, incidental   . Migraines   . Depression   . Anxiety   . UTI (lower urinary tract infection)   . Preeclampsia     Past Surgical History  Procedure Laterality Date  . Colposcopy    . Dilation and curettage of uterus      Family History  Problem Relation Age of Onset  . Other Neg Hx   . Kidney disease Mother   . Cancer Paternal Grandmother     History  Substance Use Topics  . Smoking status: Current Every Day Smoker -- 1.00 packs/day    Types: Cigarettes  . Smokeless tobacco: Never Used  . Alcohol Use: Yes     Comment: social    OB History   Grav Para Term Preterm Abortions TAB SAB Ect Mult Living   7 4 4  0 3 3 0 0 0 4      Review of Systems  All other systems reviewed and are negative.    Allergies  Review of patient's allergies indicates no known allergies.  Home Medications   Current Outpatient Rx  Name  Route  Sig  Dispense  Refill  . acetaminophen (TYLENOL) 500 MG tablet   Oral   Take 500 mg by mouth every 6 (six) hours as needed for pain.         Marland Kitchen amitriptyline (ELAVIL) 50 MG tablet   Oral   Take 50 mg by mouth at  bedtime.         . divalproex (DEPAKOTE ER) 500 MG 24 hr tablet   Oral   Take 1,000 mg by mouth at bedtime.         . gabapentin (NEURONTIN) 300 MG capsule   Oral   Take 300 mg by mouth 3 (three) times daily.         . prazosin (MINIPRESS) 2 MG capsule   Oral   Take 2 mg by mouth at bedtime.         . valACYclovir (VALTREX) 1000 MG tablet   Oral   Take 1,000 mg by mouth daily.         . clindamycin (CLEOCIN) 150 MG capsule   Oral   Take 2 capsules (300 mg total) by mouth 3 (three) times daily.   42 capsule   0   . HYDROcodone-acetaminophen (NORCO/VICODIN) 5-325 MG per tablet   Oral   Take 1 tablet by mouth every 4 (four) hours as needed for pain.   15 tablet  0   . ibuprofen (ADVIL,MOTRIN) 600 MG tablet   Oral   Take 1 tablet (600 mg total) by mouth every 8 (eight) hours as needed for pain.   15 tablet   0     BP 133/83  Pulse 102  Temp(Src) 98.5 F (36.9 C) (Oral)  Resp 18  SpO2 100%  LMP 09/02/2012  Physical Exam  Nursing note and vitals reviewed. Constitutional: She is oriented to person, place, and time. She appears well-developed and well-nourished. No distress.  HENT:  Head: Normocephalic and atraumatic.  Tonsillitis right greater than left.  Uvula midline.  No evidence of peritonsillar abscess at this time.  Exudate on bilateral tonsils.  Tolerating secretions.  Oral airway patent  Eyes: EOM are normal.  Neck: Normal range of motion.  Cardiovascular: Normal rate, regular rhythm and normal heart sounds.   Pulmonary/Chest: Effort normal and breath sounds normal.  Abdominal: Soft. She exhibits no distension. There is no tenderness.  Musculoskeletal: Normal range of motion.  Neurological: She is alert and oriented to person, place, and time.  Skin: Skin is warm and dry.  Psychiatric: She has a normal mood and affect. Judgment normal.    ED Course  Procedures (including critical care time)  Labs Reviewed - No data to display No results  found.   1. Pharyngitis       MDM  Patient with tonsillitis.  No evidence of peritonsillar abscess at this point.  She states she's been getting recurrent throat infections over the past several months and therefore she will be referred to ear nose and throat.  She may benefit from tonsillectomy.  Antibiotics now.  She understands return to the ER for new or worsening symptoms        Lyanne Co, MD 10/03/12 1017

## 2012-10-03 NOTE — ED Notes (Signed)
Pt here foe c/o fever,sore throat and h/a x3 days

## 2012-10-24 ENCOUNTER — Inpatient Hospital Stay (HOSPITAL_COMMUNITY)
Admission: AD | Admit: 2012-10-24 | Discharge: 2012-10-24 | Disposition: A | Discharge status: Home / Self Care | Payer: Medicaid Other | Source: Ambulatory Visit | Attending: Obstetrics & Gynecology | Admitting: Obstetrics & Gynecology

## 2012-10-24 ENCOUNTER — Encounter (HOSPITAL_COMMUNITY): Payer: Self-pay | Admitting: *Deleted

## 2012-10-24 DIAGNOSIS — B373 Candidiasis of vulva and vagina: Secondary | ICD-10-CM | POA: Insufficient documentation

## 2012-10-24 DIAGNOSIS — B3731 Acute candidiasis of vulva and vagina: Secondary | ICD-10-CM

## 2012-10-24 DIAGNOSIS — L293 Anogenital pruritus, unspecified: Secondary | ICD-10-CM | POA: Insufficient documentation

## 2012-10-24 LAB — URINALYSIS, ROUTINE W REFLEX MICROSCOPIC
Bilirubin Urine: NEGATIVE
Glucose, UA: NEGATIVE mg/dL
Ketones, ur: NEGATIVE mg/dL
pH: 6 (ref 5.0–8.0)

## 2012-10-24 LAB — WET PREP, GENITAL
Trich, Wet Prep: NONE SEEN
Yeast Wet Prep HPF POC: NONE SEEN

## 2012-10-24 LAB — URINE MICROSCOPIC-ADD ON

## 2012-10-24 MED ORDER — ACETAMINOPHEN 500 MG PO TABS
1000.0000 mg | ORAL_TABLET | Freq: Once | ORAL | Status: AC
  Administered 2012-10-24: 1000 mg via ORAL
  Filled 2012-10-24 (×2): qty 1

## 2012-10-24 MED ORDER — FLUCONAZOLE 150 MG PO TABS
150.0000 mg | ORAL_TABLET | Freq: Once | ORAL | Status: AC
  Administered 2012-10-24: 150 mg via ORAL
  Filled 2012-10-24: qty 1

## 2012-10-24 NOTE — MAU Note (Signed)
Pt reports she started having vaginal itching and swelling today. Started some yeast infection cream and sympoms got worse. Denies discharge .

## 2012-10-24 NOTE — MAU Note (Signed)
Pt reports itching, swelling and abdominal pain

## 2012-10-24 NOTE — MAU Provider Note (Signed)
History     CSN: [id number]  Arrival date and time: 10/24/12 2017   None     Chief Complaint  Patient presents with  . Groin Swelling  . Vaginal Itching   Vaginal Itching    Robin Horn is a 27 y.o. [id number] who presents today with vulvar itching and swelling. She states that she gets frequent yeast infections, and this seems similar. She tried OTC yeast infection cream and it did not help very much and made her feel more irritated.   Past Medical History  Diagnosis Date  . Abnormal Pap smear of cervix   . Gonorrhea   . Chlamydia   . Herpes   . Pregnant state, incidental   . Migraines   . Depression   . Anxiety   . UTI (lower urinary tract infection)   . Preeclampsia     Past Surgical History  Procedure Laterality Date  . Colposcopy    . Dilation and curettage of uterus      Family History  Problem Relation Age of Onset  . Other Neg Hx   . Kidney disease Mother   . Cancer Paternal Grandmother     History  Substance Use Topics  . Smoking status: Current Every Day Smoker -- 1.00 packs/day    Types: Cigarettes  . Smokeless tobacco: Never Used  . Alcohol Use: Yes     Comment: social    Allergies: No Known Allergies  Prescriptions prior to admission  Medication Sig Dispense Refill  . Aspirin-Salicylamide-Caffeine (BC HEADACHE POWDER PO) Take 1 packet by mouth daily as needed (For headache.).      Marland Kitchen clindamycin (CLEOCIN) 150 MG capsule Take 2 capsules (300 mg total) by mouth 3 (three) times daily.  42 capsule  0  . HYDROcodone-acetaminophen (NORCO/VICODIN) 5-325 MG per tablet Take 1 tablet by mouth every 4 (four) hours as needed for pain.  15 tablet  0  . miconazole (MICOTIN) 2 % cream Apply 1 application topically daily as needed (For itching.).      Marland Kitchen valACYclovir (VALTREX) 1000 MG tablet Take 1,000 mg by mouth daily.        ROS Physical Exam   Blood pressure 128/84, pulse 90, temperature 99 F (37.2 C), temperature source Oral, resp. rate 18,  height 5\' 1"  (1.549 m), weight 67.677 kg (149 lb 3.2 oz), last menstrual period 10/08/2012.  Physical Exam  Nursing note and vitals reviewed. Constitutional: She is oriented to person, place, and time. She appears well-developed and well-nourished. No distress.  Cardiovascular: Normal rate.   Respiratory: Effort normal.  GI: Soft. There is no tenderness.  Genitourinary:  .External: no lesion Vagina: small amount of white discharge Cervix: pink, smooth, no CMT Uterus: NSSC Adnexa: NT   Neurological: She is alert and oriented to person, place, and time.  Skin: Skin is warm and dry.  Psychiatric: She has a normal mood and affect.    MAU Course  Procedures  Results for orders placed during the hospital encounter of 10/24/12 (from the past 24 hour(s))  URINALYSIS, ROUTINE W REFLEX MICROSCOPIC     Status: Abnormal   Collection Time    10/24/12  8:30 PM      Result Value Range   Color, Urine YELLOW  YELLOW   APPearance CLEAR  CLEAR   Specific Gravity, Urine 1.020  1.005 - 1.030   pH 6.0  5.0 - 8.0   Glucose, UA NEGATIVE  NEGATIVE mg/dL   Hgb urine dipstick NEGATIVE  NEGATIVE  Bilirubin Urine NEGATIVE  NEGATIVE   Ketones, ur NEGATIVE  NEGATIVE mg/dL   Protein, ur NEGATIVE  NEGATIVE mg/dL   Urobilinogen, UA 0.2  0.0 - 1.0 mg/dL   Nitrite NEGATIVE  NEGATIVE   Leukocytes, UA TRACE (*) NEGATIVE  URINE MICROSCOPIC-ADD ON     Status: Abnormal   Collection Time    10/24/12  8:30 PM      Result Value Range   Squamous Epithelial / LPF MANY (*) RARE   WBC, UA 0-2  <3 WBC/hpf   Bacteria, UA FEW (*) RARE   Urine-Other MUCOUS PRESENT    POCT PREGNANCY, URINE     Status: None   Collection Time    10/24/12  8:53 PM      Result Value Range   Preg Test, Ur NEGATIVE  NEGATIVE  WET PREP, GENITAL     Status: Abnormal   Collection Time    10/24/12 10:10 PM      Result Value Range   Yeast Wet Prep HPF POC NONE SEEN  NONE SEEN   Trich, Wet Prep NONE SEEN  NONE SEEN   Clue Cells Wet Prep  HPF POC FEW (*) NONE SEEN   WBC, Wet Prep HPF POC FEW (*) NONE SEEN     Assessment and Plan   1. Yeast infection involving the vagina and surrounding area    Diflucan here in MAU FU with the health department for contraception  Tawnya Crook 10/24/2012, 10:20 PM

## 2012-10-25 LAB — GC/CHLAMYDIA PROBE AMP
CT Probe RNA: NEGATIVE
GC Probe RNA: NEGATIVE

## 2012-12-04 ENCOUNTER — Emergency Department (HOSPITAL_COMMUNITY)
Admission: EM | Admit: 2012-12-04 | Discharge: 2012-12-05 | Disposition: A | Discharge status: Home / Self Care | Payer: Medicaid Other | Attending: Emergency Medicine | Admitting: Emergency Medicine

## 2012-12-04 ENCOUNTER — Encounter (HOSPITAL_COMMUNITY): Payer: Self-pay | Admitting: Emergency Medicine

## 2012-12-04 DIAGNOSIS — IMO0001 Reserved for inherently not codable concepts without codable children: Secondary | ICD-10-CM | POA: Insufficient documentation

## 2012-12-04 DIAGNOSIS — Z8669 Personal history of other diseases of the nervous system and sense organs: Secondary | ICD-10-CM | POA: Insufficient documentation

## 2012-12-04 DIAGNOSIS — M7918 Myalgia, other site: Secondary | ICD-10-CM

## 2012-12-04 DIAGNOSIS — M545 Low back pain, unspecified: Secondary | ICD-10-CM | POA: Insufficient documentation

## 2012-12-04 DIAGNOSIS — F17200 Nicotine dependence, unspecified, uncomplicated: Secondary | ICD-10-CM | POA: Insufficient documentation

## 2012-12-04 DIAGNOSIS — F411 Generalized anxiety disorder: Secondary | ICD-10-CM | POA: Insufficient documentation

## 2012-12-04 DIAGNOSIS — Z8619 Personal history of other infectious and parasitic diseases: Secondary | ICD-10-CM | POA: Insufficient documentation

## 2012-12-04 DIAGNOSIS — Z87440 Personal history of urinary (tract) infections: Secondary | ICD-10-CM | POA: Insufficient documentation

## 2012-12-04 DIAGNOSIS — F329 Major depressive disorder, single episode, unspecified: Secondary | ICD-10-CM | POA: Insufficient documentation

## 2012-12-04 DIAGNOSIS — Z862 Personal history of diseases of the blood and blood-forming organs and certain disorders involving the immune mechanism: Secondary | ICD-10-CM | POA: Insufficient documentation

## 2012-12-04 DIAGNOSIS — M25539 Pain in unspecified wrist: Secondary | ICD-10-CM | POA: Insufficient documentation

## 2012-12-04 DIAGNOSIS — R42 Dizziness and giddiness: Secondary | ICD-10-CM

## 2012-12-04 DIAGNOSIS — F3289 Other specified depressive episodes: Secondary | ICD-10-CM | POA: Insufficient documentation

## 2012-12-04 DIAGNOSIS — G8929 Other chronic pain: Secondary | ICD-10-CM | POA: Insufficient documentation

## 2012-12-04 DIAGNOSIS — Z3202 Encounter for pregnancy test, result negative: Secondary | ICD-10-CM | POA: Insufficient documentation

## 2012-12-04 DIAGNOSIS — Z79899 Other long term (current) drug therapy: Secondary | ICD-10-CM | POA: Insufficient documentation

## 2012-12-04 LAB — BASIC METABOLIC PANEL
BUN: 13 mg/dL (ref 6–23)
Calcium: 9.8 mg/dL (ref 8.4–10.5)
Creatinine, Ser: 1.11 mg/dL — ABNORMAL HIGH (ref 0.50–1.10)
GFR calc non Af Amer: 68 mL/min — ABNORMAL LOW (ref >90)
Glucose, Bld: 97 mg/dL (ref 70–99)
Sodium: 132 mEq/L — ABNORMAL LOW (ref 135–145)

## 2012-12-04 LAB — CBC WITH DIFFERENTIAL/PLATELET
Eosinophils Absolute: 0.2 10*3/uL (ref 0.0–0.7)
Eosinophils Relative: 2 % (ref 0–5)
Lymphs Abs: 2.3 10*3/uL (ref 0.7–4.0)
MCH: 27.8 pg (ref 26.0–34.0)
MCV: 85.3 fL (ref 78.0–100.0)
Platelets: 249 10*3/uL (ref 150–400)
RBC: 4.42 MIL/uL (ref 3.87–5.11)
RDW: 13.4 % (ref 11.5–15.5)

## 2012-12-04 LAB — URINALYSIS, ROUTINE W REFLEX MICROSCOPIC
Bilirubin Urine: NEGATIVE
Ketones, ur: NEGATIVE mg/dL
Protein, ur: NEGATIVE mg/dL
Urobilinogen, UA: 0.2 mg/dL (ref 0.0–1.0)

## 2012-12-04 MED ORDER — MORPHINE SULFATE 4 MG/ML IJ SOLN
4.0000 mg | Freq: Once | INTRAMUSCULAR | Status: AC
  Administered 2012-12-04: 4 mg via INTRAVENOUS
  Filled 2012-12-04: qty 1

## 2012-12-04 MED ORDER — ONDANSETRON HCL 4 MG/2ML IJ SOLN
4.0000 mg | Freq: Once | INTRAMUSCULAR | Status: AC
  Administered 2012-12-04: 4 mg via INTRAVENOUS
  Filled 2012-12-04: qty 2

## 2012-12-04 MED ORDER — SODIUM CHLORIDE 0.9 % IV BOLUS (SEPSIS)
1000.0000 mL | Freq: Once | INTRAVENOUS | Status: AC
  Administered 2012-12-04: 1000 mL via INTRAVENOUS

## 2012-12-04 NOTE — ED Notes (Signed)
Pt states she is having dizziness, back pain, and wrist pain  Pt states she has been moving furniture  Pt states she has previous problems with her left wrist and used to have a splint she wore for it but no longer has one   Pt states the dizziness started today  Back pain started a couple days ago but is worse today

## 2012-12-04 NOTE — ED Provider Notes (Signed)
CSN: [id number]     Arrival date & time 12/04/12  2026 History     First MD Initiated Contact with Patient 12/04/12 2150     Chief Complaint  Patient presents with  . Dizziness  . Back Pain  . Wrist Pain   (Consider location/radiation/quality/duration/timing/severity/associated sxs/prior Treatment) HPI  Robin Horn is a 27 y.o. female complaining of exacerbation of chronic low back pain and left wrist pain over the last 2 days states she's been moving furniture. Patient also states that she's been having a lightheaded sensation when going from sitting to standing and also chills over the last 24 hours. Patient denies cough, shortness of breath, emesis, abdominal pain, change in bowel or bladder habits. She has a history of anemia requiring transfusions. She got a call from her primary care Dr. that told her she was low on something on her blood work after go and take some sort of supplement up at the pharmacy but should have time to do that.  Past Medical History  Diagnosis Date  . Abnormal Pap smear of cervix   . Gonorrhea   . Chlamydia   . Herpes   . Pregnant state, incidental   . Migraines   . Depression   . Anxiety   . UTI (lower urinary tract infection)   . Preeclampsia    Past Surgical History  Procedure Laterality Date  . Colposcopy    . Dilation and curettage of uterus     Family History  Problem Relation Age of Onset  . Other Neg Hx   . Kidney disease Mother   . Cancer Paternal Grandmother    History  Substance Use Topics  . Smoking status: Current Every Day Smoker -- 0.50 packs/day    Types: Cigarettes  . Smokeless tobacco: Never Used  . Alcohol Use: Yes     Comment: social   OB History   Grav Para Term Preterm Abortions TAB SAB Ect Mult Living   7 4 4  0 3 3 0 0 0 4     Review of Systems 10 systems reviewed and found to be negative, except as noted in the HPI    Allergies  Review of patient's allergies indicates no known allergies.  Home  Medications   Current Outpatient Rx  Name  Route  Sig  Dispense  Refill  . medroxyPROGESTERone (DEPO-PROVERA) 150 MG/ML injection   Intramuscular   Inject 150 mg into the muscle every 3 (three) months.         . QUEtiapine (SEROQUEL) 100 MG tablet   Oral   Take 100 mg by mouth at bedtime.         . valACYclovir (VALTREX) 1000 MG tablet   Oral   Take 1,000 mg by mouth daily as needed (for outbreaks).           BP 138/121  Pulse 97  Temp(Src) 99.4 F (37.4 C) (Oral)  Resp 20  Ht 5\' 1"  (1.549 m)  Wt 150 lb (68.04 kg)  BMI 28.36 kg/m2  SpO2 100% Physical Exam  Nursing note and vitals reviewed. Constitutional: She is oriented to person, place, and time. She appears well-developed and well-nourished. No distress.  HENT:  Head: Normocephalic.  Mouth/Throat: Oropharynx is clear and moist.  Eyes: Conjunctivae and EOM are normal. Pupils are equal, round, and reactive to light.  Neck: Normal range of motion.  Cardiovascular: Normal rate, regular rhythm and intact distal pulses.   Pulmonary/Chest: Effort normal and breath sounds normal. No stridor.  No respiratory distress. She has no wheezes. She has no rales. She exhibits no tenderness.  Abdominal: Bowel sounds are normal. She exhibits no distension and no mass. There is no tenderness. There is no rebound and no guarding.  Musculoskeletal: Normal range of motion.  Neurological: She is alert and oriented to person, place, and time.  Psychiatric: She has a normal mood and affect.    ED Course   Procedures (including critical care time)  Labs Reviewed  BASIC METABOLIC PANEL - Abnormal; Notable for the following:    Sodium 132 (*)    Creatinine, Ser 1.11 (*)    GFR calc non Af Amer 68 (*)    GFR calc Af Amer 79 (*)    All other components within normal limits  URINALYSIS, ROUTINE W REFLEX MICROSCOPIC - Abnormal; Notable for the following:    APPearance CLOUDY (*)    Hgb urine dipstick SMALL (*)    All other components  within normal limits  URINE MICROSCOPIC-ADD ON - Abnormal; Notable for the following:    Bacteria, UA FEW (*)    All other components within normal limits  CBC WITH DIFFERENTIAL  PREGNANCY, URINE   No results found. 1. Musculoskeletal pain   2. Dizziness     MDM   Filed Vitals:   12/04/12 2035 12/04/12 2221 12/04/12 2223 12/04/12 2225  BP: 138/121 132/80 128/89 119/79  Pulse: 97 98 100 101  Temp: 99.4 F (37.4 C)     TempSrc: Oral     Resp: 20     Height: 5\' 1"  (1.549 m)     Weight: 150 lb (68.04 kg)     SpO2: 100%        Robin Horn is a 27 y.o. female with back and wrist pain along with dizziness. Patient states that she has a history of severe anemia requiring transfusions. Her blood work today shows no abnormalities.   Medications  morphine 4 MG/ML injection 4 mg (4 mg Intravenous Given 12/04/12 2213)  ondansetron (ZOFRAN) injection 4 mg (4 mg Intravenous Given 12/04/12 2213)  sodium chloride 0.9 % bolus 1,000 mL (0 mLs Intravenous Stopped 12/04/12 2318)  sodium chloride 0.9 % bolus 1,000 mL (1,000 mLs Intravenous New Bag/Given 12/04/12 2336)    Pt is hemodynamically stable, appropriate for, and amenable to discharge at this time. Pt verbalized understanding and agrees with care plan. All questions answered. Outpatient follow-up and specific return precautions discussed.    New Prescriptions   CYCLOBENZAPRINE (FLEXERIL) 10 MG TABLET    Take 1 tablet (10 mg total) by mouth 2 (two) times daily as needed for muscle spasms.    Note: Portions of this report may have been transcribed using voice recognition software. Every effort was made to ensure accuracy; however, inadvertent computerized transcription errors may be present    Wynetta Emery, PA-C 12/05/12 0044

## 2012-12-05 MED ORDER — CYCLOBENZAPRINE HCL 10 MG PO TABS: 10.0000 mg | ORAL_TABLET | Freq: Two times a day (BID) | ORAL | Status: DC | PRN

## 2012-12-05 NOTE — ED Provider Notes (Signed)
Medical screening examination/treatment/procedure(s) were performed by non-physician practitioner and as supervising physician I was immediately available for consultation/collaboration.   [name], MD 12/05/12 2322

## 2012-12-10 ENCOUNTER — Encounter (HOSPITAL_COMMUNITY): Payer: Self-pay | Admitting: Emergency Medicine

## 2012-12-10 ENCOUNTER — Emergency Department (HOSPITAL_COMMUNITY)
Admission: EM | Admit: 2012-12-10 | Discharge: 2012-12-10 | Disposition: A | Discharge status: Home / Self Care | Payer: Medicaid Other | Attending: Emergency Medicine | Admitting: Emergency Medicine

## 2012-12-10 DIAGNOSIS — F329 Major depressive disorder, single episode, unspecified: Secondary | ICD-10-CM | POA: Insufficient documentation

## 2012-12-10 DIAGNOSIS — Z87440 Personal history of urinary (tract) infections: Secondary | ICD-10-CM | POA: Insufficient documentation

## 2012-12-10 DIAGNOSIS — F411 Generalized anxiety disorder: Secondary | ICD-10-CM | POA: Insufficient documentation

## 2012-12-10 DIAGNOSIS — Z8669 Personal history of other diseases of the nervous system and sense organs: Secondary | ICD-10-CM | POA: Insufficient documentation

## 2012-12-10 DIAGNOSIS — Z791 Long term (current) use of non-steroidal anti-inflammatories (NSAID): Secondary | ICD-10-CM | POA: Insufficient documentation

## 2012-12-10 DIAGNOSIS — F3289 Other specified depressive episodes: Secondary | ICD-10-CM | POA: Insufficient documentation

## 2012-12-10 DIAGNOSIS — Z8619 Personal history of other infectious and parasitic diseases: Secondary | ICD-10-CM | POA: Insufficient documentation

## 2012-12-10 DIAGNOSIS — G5602 Carpal tunnel syndrome, left upper limb: Secondary | ICD-10-CM

## 2012-12-10 DIAGNOSIS — G5600 Carpal tunnel syndrome, unspecified upper limb: Secondary | ICD-10-CM | POA: Insufficient documentation

## 2012-12-10 DIAGNOSIS — R209 Unspecified disturbances of skin sensation: Secondary | ICD-10-CM | POA: Insufficient documentation

## 2012-12-10 DIAGNOSIS — F17200 Nicotine dependence, unspecified, uncomplicated: Secondary | ICD-10-CM | POA: Insufficient documentation

## 2012-12-10 DIAGNOSIS — M25439 Effusion, unspecified wrist: Secondary | ICD-10-CM | POA: Insufficient documentation

## 2012-12-10 DIAGNOSIS — Z79899 Other long term (current) drug therapy: Secondary | ICD-10-CM | POA: Insufficient documentation

## 2012-12-10 MED ORDER — IBUPROFEN 800 MG PO TABS: 800.0000 mg | ORAL_TABLET | Freq: Three times a day (TID) | ORAL | Status: DC

## 2012-12-10 NOTE — ED Notes (Signed)
Pt states that she has been having lt wrist pain x 5 months.  Has been seen for this previously/had x-rays and was told it was carpal tunnel.  Has followed up and also has an appt w/ Dr. Izora Ribas on Monday.

## 2012-12-10 NOTE — ED Provider Notes (Signed)
CSN: [id number]     Arrival date & time 12/10/12  1233 History     First MD Initiated Contact with Patient 12/10/12 1243     Chief Complaint  Patient presents with  . Wrist Pain   (Consider location/radiation/quality/duration/timing/severity/associated sxs/prior Treatment) HPI Pt is a 27yo female c/o left wrist pain that started about 1 week ago, gradually worsening.  Pt states left hand feels like it is burning, numbness in thumb, index, and middle finger.  Worse with movement and worse with palpation of her wrist.  Reports being dx with carpal tunnel 3mo ago.  Does have f/u with Dr. Izora Ribas on Monday but wants the pain to stop as it brought pt to tears this morning.  Also reports some swelling of left wrist.  Denies fever or rash.  Pt is right handed.    Past Medical History  Diagnosis Date  . Abnormal Pap smear of cervix   . Gonorrhea   . Chlamydia   . Herpes   . Pregnant state, incidental   . Migraines   . Depression   . Anxiety   . UTI (lower urinary tract infection)   . Preeclampsia    Past Surgical History  Procedure Laterality Date  . Colposcopy    . Dilation and curettage of uterus     Family History  Problem Relation Age of Onset  . Other Neg Hx   . Kidney disease Mother   . Cancer Paternal Grandmother    History  Substance Use Topics  . Smoking status: Current Every Day Smoker -- 0.50 packs/day    Types: Cigarettes  . Smokeless tobacco: Never Used  . Alcohol Use: Yes     Comment: social   OB History   Grav Para Term Preterm Abortions TAB SAB Ect Mult Living   7 4 4  0 3 3 0 0 0 4     Review of Systems  Musculoskeletal: Positive for myalgias, joint swelling and arthralgias.       Left wrist   Skin: Negative for rash and wound.  All other systems reviewed and are negative.    Allergies  Review of patient's allergies indicates no known allergies.  Home Medications   Current Outpatient Rx  Name  Route  Sig  Dispense  Refill  .  medroxyPROGESTERone (DEPO-PROVERA) 150 MG/ML injection   Intramuscular   Inject 150 mg into the muscle every 3 (three) months.         . QUEtiapine (SEROQUEL) 100 MG tablet   Oral   Take 100 mg by mouth at bedtime.         . valACYclovir (VALTREX) 1000 MG tablet   Oral   Take 1,000 mg by mouth daily as needed (for outbreaks).          . cyclobenzaprine (FLEXERIL) 10 MG tablet   Oral   Take 1 tablet (10 mg total) by mouth 2 (two) times daily as needed for muscle spasms.   20 tablet   0   . ibuprofen (ADVIL,MOTRIN) 800 MG tablet   Oral   Take 1 tablet (800 mg total) by mouth 3 (three) times daily.   21 tablet   0    BP 135/82  Pulse 100  Temp(Src) 98.7 F (37.1 C) (Oral)  Resp 18  SpO2 100% Physical Exam  Nursing note and vitals reviewed. Constitutional: She is oriented to person, place, and time. She appears well-developed and well-nourished.  HENT:  Head: Normocephalic and atraumatic.  Eyes: EOM are normal.  Neck: Normal range of motion.  Cardiovascular: Normal rate.   Pulmonary/Chest: Effort normal.  Musculoskeletal: Normal range of motion. She exhibits tenderness. She exhibits no edema.       Hands: Pain in numbness in left wrist.  Numbness along median nerve distribution.  No ecchymosis or erythema.  Palpable 1cm mass on volar aspect of left wrist near radial side. TTP. Positvie tinsel test.   Neurological: She is alert and oriented to person, place, and time.  Skin: Skin is warm and dry. No rash noted. No erythema.  Psychiatric: She has a normal mood and affect. Her behavior is normal.    ED Course   Procedures (including critical care time)  Labs Reviewed - No data to display No results found. 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 Pt states she lost her wrist splint during a recent move.  Will give cock-up splint.  Pt is to f/u with Dr. Izora Ribas as scheduled on Monday, 8/8.   Junius Finner, PA-C 12/10/12 1559

## 2012-12-11 NOTE — ED Provider Notes (Signed)
Medical screening examination/treatment/procedure(s) were performed by non-physician practitioner and as supervising physician I was immediately available for consultation/collaboration.  Takela Varden R. Ori Kreiter, MD 12/11/12 0650 

## 2013-01-01 ENCOUNTER — Ambulatory Visit (INDEPENDENT_AMBULATORY_CARE_PROVIDER_SITE_OTHER): Payer: Medicaid Other

## 2013-01-01 DIAGNOSIS — M79609 Pain in unspecified limb: Secondary | ICD-10-CM

## 2013-01-01 NOTE — Procedures (Signed)
  HISTORY:  Robin Horn is a 27 year old patient with a history of a primarily left-sided wrist discomfort and tingling sensations. The patient is being evaluated for a possible neuropathy.  NERVE CONDUCTION STUDIES:  Nerve conduction studies were performed on both upper extremities. The distal motor latencies and motor amplitudes for the median and ulnar nerves were within normal limits. The F wave latencies and nerve conduction velocities for these nerves were also normal. The sensory latencies for the median and ulnar nerves were normal.   EMG STUDIES:  EMG evaluation was not performed.  IMPRESSION:  Nerve conduction studies done on both upper extremities are within normal limits. No evidence of a neuropathy is seen. If a cervical radiculopathy is clinically suspected, we would recommend EMG evaluation of the affected extremity.  Marlan Palau MD 01/01/2013 2:01 PM  [facility] [address] [place], [place] [address]  Phone [phone] Fax [phone]

## 2013-01-08 ENCOUNTER — Emergency Department (HOSPITAL_COMMUNITY)
Admission: EM | Admit: 2013-01-08 | Discharge: 2013-01-08 | Disposition: A | Discharge status: Home / Self Care | Payer: Medicaid Other | Attending: Emergency Medicine | Admitting: Emergency Medicine

## 2013-01-08 ENCOUNTER — Encounter (HOSPITAL_COMMUNITY): Payer: Self-pay | Admitting: Emergency Medicine

## 2013-01-08 DIAGNOSIS — F3289 Other specified depressive episodes: Secondary | ICD-10-CM | POA: Insufficient documentation

## 2013-01-08 DIAGNOSIS — F17200 Nicotine dependence, unspecified, uncomplicated: Secondary | ICD-10-CM | POA: Insufficient documentation

## 2013-01-08 DIAGNOSIS — Z79899 Other long term (current) drug therapy: Secondary | ICD-10-CM | POA: Insufficient documentation

## 2013-01-08 DIAGNOSIS — R599 Enlarged lymph nodes, unspecified: Secondary | ICD-10-CM | POA: Insufficient documentation

## 2013-01-08 DIAGNOSIS — R509 Fever, unspecified: Secondary | ICD-10-CM | POA: Insufficient documentation

## 2013-01-08 DIAGNOSIS — J029 Acute pharyngitis, unspecified: Secondary | ICD-10-CM | POA: Insufficient documentation

## 2013-01-08 DIAGNOSIS — H6590 Unspecified nonsuppurative otitis media, unspecified ear: Secondary | ICD-10-CM | POA: Insufficient documentation

## 2013-01-08 DIAGNOSIS — F329 Major depressive disorder, single episode, unspecified: Secondary | ICD-10-CM | POA: Insufficient documentation

## 2013-01-08 DIAGNOSIS — F411 Generalized anxiety disorder: Secondary | ICD-10-CM | POA: Insufficient documentation

## 2013-01-08 DIAGNOSIS — H9209 Otalgia, unspecified ear: Secondary | ICD-10-CM | POA: Insufficient documentation

## 2013-01-08 DIAGNOSIS — M436 Torticollis: Secondary | ICD-10-CM | POA: Insufficient documentation

## 2013-01-08 DIAGNOSIS — J3489 Other specified disorders of nose and nasal sinuses: Secondary | ICD-10-CM | POA: Insufficient documentation

## 2013-01-08 MED ORDER — ACETAMINOPHEN 500 MG PO TABS
1000.0000 mg | ORAL_TABLET | Freq: Once | ORAL | Status: AC
  Administered 2013-01-08: 1000 mg via ORAL
  Filled 2013-01-08: qty 2

## 2013-01-08 MED ORDER — HYDROCODONE-ACETAMINOPHEN 7.5-325 MG/15ML PO SOLN: 15.0000 mL | Freq: Three times a day (TID) | ORAL | Status: DC | PRN

## 2013-01-08 MED ORDER — AMOXICILLIN 500 MG PO CAPS: 500.0000 mg | ORAL_CAPSULE | Freq: Three times a day (TID) | ORAL | Status: DC

## 2013-01-08 MED ORDER — BUTAMBEN-TETRACAINE-BENZOCAINE 2-2-14 % EX AERO
1.0000 | INHALATION_SPRAY | Freq: Once | CUTANEOUS | Status: DC
Start: 2013-01-08 — End: 2013-01-09
  Filled 2013-01-08: qty 56

## 2013-01-08 NOTE — ED Provider Notes (Signed)
CSN: [id number]     Arrival date & time 01/08/13  1704 History  This chart was scribed for non-physician practitioner working Antony Madura PA-C with Gerhard Munch, MD by Leone Payor, ED Scribe. This patient was seen in room WTR7/WTR7 and the patient's care was started at 1704.   Chief Complaint  Patient presents with  . Sore Throat   The history is provided by the patient. No language interpreter was used.   HPI Comments: Robin Horn is a 27 y.o. female who presents to the Emergency Department complaining of 7 days of gradual onset, gradually worsening, constant sore throat. Pt reports having a fever (TMAX 103 last night), ear pain, nasal congestion, and neck stiffness. She has worsened pain with drinking fluids. She has been taking Theraflu and ibuprofen with mild relief. She denies oral bleeding, difficulty swallowing, SOB, nausea, emesis.   Past Medical History  Diagnosis Date  . Abnormal Pap smear of cervix   . Gonorrhea   . Chlamydia   . Herpes   . Pregnant state, incidental   . Migraines   . Depression   . Anxiety   . UTI (lower urinary tract infection)   . Preeclampsia    Past Surgical History  Procedure Laterality Date  . Colposcopy    . Dilation and curettage of uterus     Family History  Problem Relation Age of Onset  . Other Neg Hx   . Kidney disease Mother   . Cancer Paternal Grandmother    History  Substance Use Topics  . Smoking status: Current Every Day Smoker -- 0.50 packs/day    Types: Cigarettes  . Smokeless tobacco: Never Used  . Alcohol Use: Yes     Comment: social   OB History   Grav Para Term Preterm Abortions TAB SAB Ect Mult Living   7 4 4  0 3 3 0 0 0 4     Review of Systems  HENT: Positive for ear pain, congestion, sore throat and neck stiffness. Negative for mouth sores, trouble swallowing and ear discharge.   Respiratory: Negative for shortness of breath.   Gastrointestinal: Negative for nausea and vomiting.  All other systems  reviewed and are negative.   Allergies  Review of patient's allergies indicates no known allergies.  Home Medications   Current Outpatient Rx  Name  Route  Sig  Dispense  Refill  . acetaminophen (TYLENOL) 500 MG tablet   Oral   Take 500 mg by mouth every 6 (six) hours as needed for pain.         . Chlorphen-Pseudoephed-APAP (THERAFLU FLU/COLD PO)   Oral   Take 1 packet by mouth 2 (two) times daily as needed (for cold symptoms).         Marland Kitchen ibuprofen (ADVIL,MOTRIN) 200 MG tablet   Oral   Take 400 mg by mouth every 8 (eight) hours as needed for pain.         . medroxyPROGESTERone (DEPO-PROVERA) 150 MG/ML injection   Intramuscular   Inject 150 mg into the muscle every 3 (three) months.         . Pseudoeph-Doxylamine-DM-APAP (NYQUIL PO)   Oral   Take 30 mLs by mouth at bedtime as needed (for cold symptoms).         . QUEtiapine (SEROQUEL) 100 MG tablet   Oral   Take 100 mg by mouth at bedtime.         . valACYclovir (VALTREX) 1000 MG tablet   Oral  Take 1,000 mg by mouth daily as needed (for outbreaks).          Marland Kitchen amoxicillin (AMOXIL) 500 MG capsule   Oral   Take 1 capsule (500 mg total) by mouth 3 (three) times daily.   30 capsule   0   . HYDROcodone-acetaminophen (HYCET) 7.5-325 mg/15 ml solution   Oral   Take 15 mLs by mouth every 8 (eight) hours as needed for pain.   120 mL   0    BP 116/83  Pulse 103  Temp(Src) 101.8 F (38.8 C) (Oral)  Resp 20  SpO2 100%  Physical Exam  Nursing note and vitals reviewed. Constitutional: She is oriented to person, place, and time. She appears well-developed and well-nourished. No distress.  HENT:  Head: Normocephalic and atraumatic.  Right Ear: Hearing, external ear and ear canal normal. Tympanic membrane is not erythematous. A middle ear effusion is present.  Left Ear: Hearing, external ear and ear canal normal. Tympanic membrane is not erythematous.  Mouth/Throat: Posterior oropharyngeal edema and  posterior oropharyngeal erythema present. No oropharyngeal exudate.  No frontal or maxillary sinus tenderness. Left TM mildly dull. Right ear with middle ear effusion with dull TM. No erythema to the right TM.  Bilateral tonsils are erythematous and enlarged. No exudates. Airway patent and uvula midline.   Eyes: Conjunctivae and EOM are normal. No scleral icterus.  Neck: Normal range of motion. Neck supple.  No nuchal rigidity or meningeal signs.  Cardiovascular: Normal rate, regular rhythm and normal heart sounds.   Pulmonary/Chest: Effort normal and breath sounds normal. No stridor. No respiratory distress. She has no wheezes. She has no rales.  Abdominal: Soft. She exhibits no distension. There is no tenderness.  Musculoskeletal: Normal range of motion.  Lymphadenopathy:    She has cervical adenopathy.  Neurological: She is alert and oriented to person, place, and time.  Skin: Skin is warm and dry. No rash noted. She is not diaphoretic. No erythema. No pallor.  Psychiatric: She has a normal mood and affect. Her behavior is normal.    ED Course  Procedures (including critical care time)  DIAGNOSTIC STUDIES: Oxygen Saturation is 100% on RA, normal by my interpretation.    COORDINATION OF CARE: 8:14 PM Will order a rapid strep screen. Discussed treatment plan with pt at bedside and pt agreed to plan.   Labs Review Labs Reviewed  RAPID STREP SCREEN  CULTURE, GROUP A STREP   Imaging Review No results found.  MDM   1. Viral pharyngitis    Uncomplicated viral pharyngitis. Patient is febrile, sick appearing, but nontoxic. Physical exam findings as above. Uvula midline without evidence of PTA. Airway patent and patient tolerating secretions without difficulty. Patient hemodynamically stable without tachycardia, tachypnea, dyspnea, or hypoxia. Rapid strep screen negative. Patient given tylenol in ED for fever. She is appropriate for d/c with PCP follow up. Amoxicillin prescribed to  start on 01/10/13 if symptoms do not resolve with symptomatic treatment. Rx for Hycet given for sore throat. Return precautions discussed and patient agreeable to plan.  I personally performed the services described in this documentation, which was scribed in my presence. The recorded information has been reviewed and is accurate.     Antony Madura, [place] 01/17/13 [phone]

## 2013-01-08 NOTE — ED Notes (Signed)
Pt c/o sore throat x1 week.  Reports having fevers last night.

## 2013-01-10 LAB — CULTURE, GROUP A STREP

## 2013-01-20 NOTE — ED Provider Notes (Signed)
  Medical screening examination/treatment/procedure(s) were performed by non-physician practitioner and as supervising physician I was immediately available for consultation/collaboration.    Gerhard Munch, MD 01/20/13 [phone]

## 2013-02-05 ENCOUNTER — Other Ambulatory Visit: Payer: Self-pay | Admitting: Otolaryngology

## 2013-02-12 ENCOUNTER — Encounter (HOSPITAL_COMMUNITY): Payer: Self-pay | Admitting: Emergency Medicine

## 2013-02-12 ENCOUNTER — Emergency Department (HOSPITAL_COMMUNITY)
Admission: EM | Admit: 2013-02-12 | Discharge: 2013-02-12 | Discharge status: Against Medical Advice | Payer: Medicaid Other | Attending: Emergency Medicine | Admitting: Emergency Medicine

## 2013-02-12 DIAGNOSIS — F17200 Nicotine dependence, unspecified, uncomplicated: Secondary | ICD-10-CM | POA: Insufficient documentation

## 2013-02-12 DIAGNOSIS — J029 Acute pharyngitis, unspecified: Secondary | ICD-10-CM | POA: Insufficient documentation

## 2013-02-12 DIAGNOSIS — R197 Diarrhea, unspecified: Secondary | ICD-10-CM | POA: Insufficient documentation

## 2013-02-12 DIAGNOSIS — K649 Unspecified hemorrhoids: Secondary | ICD-10-CM | POA: Insufficient documentation

## 2013-02-12 NOTE — ED Notes (Signed)
The pt had her tonsils removed last Wednesday .  For the past 3 days she has had a sorethroat and not sble to eat.  She has also had diarrhea and she has not voided in 3 days.  She has jhad little dribbles..  She also has hemorrhoids now from the diarrhea.  lmp on depo shot

## 2013-02-13 ENCOUNTER — Encounter (HOSPITAL_COMMUNITY): Payer: Self-pay | Admitting: Emergency Medicine

## 2013-02-13 ENCOUNTER — Emergency Department (HOSPITAL_COMMUNITY)
Admission: EM | Admit: 2013-02-13 | Discharge: 2013-02-13 | Disposition: A | Discharge status: Home / Self Care | Payer: Medicaid Other | Attending: Emergency Medicine | Admitting: Emergency Medicine

## 2013-02-13 DIAGNOSIS — Y838 Other surgical procedures as the cause of abnormal reaction of the patient, or of later complication, without mention of misadventure at the time of the procedure: Secondary | ICD-10-CM | POA: Insufficient documentation

## 2013-02-13 DIAGNOSIS — Z79899 Other long term (current) drug therapy: Secondary | ICD-10-CM | POA: Insufficient documentation

## 2013-02-13 DIAGNOSIS — R197 Diarrhea, unspecified: Secondary | ICD-10-CM | POA: Insufficient documentation

## 2013-02-13 DIAGNOSIS — B370 Candidal stomatitis: Secondary | ICD-10-CM | POA: Insufficient documentation

## 2013-02-13 DIAGNOSIS — Z8742 Personal history of other diseases of the female genital tract: Secondary | ICD-10-CM | POA: Insufficient documentation

## 2013-02-13 DIAGNOSIS — Z87440 Personal history of urinary (tract) infections: Secondary | ICD-10-CM | POA: Insufficient documentation

## 2013-02-13 DIAGNOSIS — Z8619 Personal history of other infectious and parasitic diseases: Secondary | ICD-10-CM | POA: Insufficient documentation

## 2013-02-13 DIAGNOSIS — F411 Generalized anxiety disorder: Secondary | ICD-10-CM | POA: Insufficient documentation

## 2013-02-13 DIAGNOSIS — R1310 Dysphagia, unspecified: Secondary | ICD-10-CM | POA: Insufficient documentation

## 2013-02-13 DIAGNOSIS — F3289 Other specified depressive episodes: Secondary | ICD-10-CM | POA: Insufficient documentation

## 2013-02-13 DIAGNOSIS — F17200 Nicotine dependence, unspecified, uncomplicated: Secondary | ICD-10-CM | POA: Insufficient documentation

## 2013-02-13 DIAGNOSIS — R6883 Chills (without fever): Secondary | ICD-10-CM | POA: Insufficient documentation

## 2013-02-13 DIAGNOSIS — Z792 Long term (current) use of antibiotics: Secondary | ICD-10-CM | POA: Insufficient documentation

## 2013-02-13 DIAGNOSIS — Z8679 Personal history of other diseases of the circulatory system: Secondary | ICD-10-CM | POA: Insufficient documentation

## 2013-02-13 DIAGNOSIS — R112 Nausea with vomiting, unspecified: Secondary | ICD-10-CM | POA: Insufficient documentation

## 2013-02-13 DIAGNOSIS — J029 Acute pharyngitis, unspecified: Secondary | ICD-10-CM | POA: Insufficient documentation

## 2013-02-13 DIAGNOSIS — F329 Major depressive disorder, single episode, unspecified: Secondary | ICD-10-CM | POA: Insufficient documentation

## 2013-02-13 LAB — CBC WITH DIFFERENTIAL/PLATELET
Basophils Absolute: 0 10*3/uL (ref 0.0–0.1)
Basophils Relative: 0 % (ref 0–1)
Lymphocytes Relative: 8 % — ABNORMAL LOW (ref 12–46)
MCHC: 32.8 g/dL (ref 30.0–36.0)
Neutro Abs: 10.5 10*3/uL — ABNORMAL HIGH (ref 1.7–7.7)
Platelets: 378 10*3/uL (ref 150–400)
RDW: 14.7 % (ref 11.5–15.5)
WBC: 11.6 10*3/uL — ABNORMAL HIGH (ref 4.0–10.5)

## 2013-02-13 LAB — URINALYSIS, ROUTINE W REFLEX MICROSCOPIC
Glucose, UA: NEGATIVE mg/dL
Nitrite: NEGATIVE
Protein, ur: NEGATIVE mg/dL
pH: 5 (ref 5.0–8.0)

## 2013-02-13 LAB — BASIC METABOLIC PANEL
CO2: 25 mEq/L (ref 19–32)
Calcium: 9.5 mg/dL (ref 8.4–10.5)
Chloride: 99 mEq/L (ref 96–112)
GFR calc Af Amer: >90 mL/min (ref >90)
Sodium: 136 mEq/L (ref 135–145)

## 2013-02-13 LAB — URINE MICROSCOPIC-ADD ON

## 2013-02-13 MED ORDER — KETOROLAC TROMETHAMINE 30 MG/ML IJ SOLN
30.0000 mg | Freq: Once | INTRAMUSCULAR | Status: DC
  Filled 2013-02-13: qty 1

## 2013-02-13 MED ORDER — HYDROCODONE-ACETAMINOPHEN 7.5-325 MG/15ML PO SOLN
15.0000 mL | Freq: Four times a day (QID) | ORAL | Status: DC | PRN
Start: 2013-02-13 — End: 2013-06-20

## 2013-02-13 MED ORDER — NYSTATIN 100000 UNIT/ML MT SUSP: 500000.0000 [IU] | Freq: Four times a day (QID) | OROMUCOSAL | Status: DC

## 2013-02-13 MED ORDER — NYSTATIN 100000 UNIT/ML MT SUSP
5.0000 mL | Freq: Once | OROMUCOSAL | Status: AC
  Administered 2013-02-13: 500000 [IU] via ORAL
  Filled 2013-02-13: qty 5

## 2013-02-13 MED ORDER — SODIUM CHLORIDE 0.9 % IV BOLUS (SEPSIS)
1000.0000 mL | Freq: Once | INTRAVENOUS | Status: AC
  Administered 2013-02-13: 1000 mL via INTRAVENOUS

## 2013-02-13 NOTE — ED Notes (Signed)
Patient reports that she began having constipation and vomiting since 02/09/13. Patient reports that she had a tonsillectomy on 02/05/13. Patient states the vomiting has irritated her throat and she has been unable to eat or drink. Patient also c/o that she was given liquid pain meds and the pain med only lasts about an hour and the liquid med also burns her throat.

## 2013-02-13 NOTE — ED Notes (Addendum)
Per pt has tonsillectomy on 10/08. Pt states onset of n/v 10/12. Pt denies vomiting today. Pt reports taking laxative on Saturday with diarrhea ever since. Pt reports 7 episodes of diarrhea today. Pt states it is painful to swallow. Pt reports has not been able to pee; little output.

## 2013-02-13 NOTE — ED Provider Notes (Signed)
Medical screening examination/treatment/procedure(s) were performed by non-physician practitioner and as supervising physician I was immediately available for consultation/collaboration.   [place] [facility] Damia Bobrowski, MD 02/13/13 2323 

## 2013-02-13 NOTE — ED Provider Notes (Signed)
CSN: [id number]     Arrival date & time 02/13/13  1621 History   First MD Initiated Contact with Patient 02/13/13 1637     Chief Complaint  Patient presents with  . Post-op Problem    tonsilectomy   (Consider location/radiation/quality/duration/timing/severity/associated sxs/prior Treatment) HPI Comments: Patient is s/p tonsillectomy by Dr. Emeline Darling on 02/05/13.  Patient has been having persistent pain with intermittent short-term relief after pain medications.     Patient is a 27 y.o. female presenting with pharyngitis.  Sore Throat This is a new problem. The current episode started in the past 7 days. The problem occurs constantly. The problem has been waxing and waning. Associated symptoms include chills, nausea and vomiting. Pertinent negatives include no swollen glands. The symptoms are aggravated by drinking and eating. She has tried oral narcotics for the symptoms. The treatment provided mild (pain returns within an hour of medication) relief.    Past Medical History  Diagnosis Date  . Abnormal Pap smear of cervix   . Gonorrhea   . Chlamydia   . Herpes   . Pregnant state, incidental   . Migraines   . Depression   . Anxiety   . UTI (lower urinary tract infection)   . Preeclampsia    Past Surgical History  Procedure Laterality Date  . Colposcopy    . Dilation and curettage of uterus    . Tonsillectomy     Family History  Problem Relation Age of Onset  . Other Neg Hx   . Kidney disease Mother   . Cancer Paternal Grandmother    History  Substance Use Topics  . Smoking status: Current Every Day Smoker -- 0.50 packs/day    Types: Cigarettes  . Smokeless tobacco: Never Used  . Alcohol Use: Yes     Comment: social   OB History   Grav Para Term Preterm Abortions TAB SAB Ect Mult Living   7 4 4  0 3 3 0 0 0 4     Review of Systems  Constitutional: Positive for chills.  HENT: Positive for trouble swallowing.   Gastrointestinal: Positive for nausea, vomiting, diarrhea  and constipation.  Genitourinary: Positive for decreased urine volume.  All other systems reviewed and are negative.    Allergies  Review of patient's allergies indicates no known allergies.  Home Medications   Current Outpatient Rx  Name  Route  Sig  Dispense  Refill  . acetaminophen (TYLENOL) 500 MG tablet   Oral   Take 500 mg by mouth every 6 (six) hours as needed for pain.         Marland Kitchen amoxicillin (AMOXIL) 500 MG capsule   Oral   Take 500 mg by mouth 2 (two) times daily.         Marland Kitchen HYDROcodone-acetaminophen (HYCET) 7.5-325 mg/15 ml solution   Oral   Take 15 mLs by mouth every 8 (eight) hours as needed for pain.   120 mL   0   . ibuprofen (ADVIL,MOTRIN) 200 MG tablet   Oral   Take 400 mg by mouth every 8 (eight) hours as needed for pain.         . medroxyPROGESTERone (DEPO-PROVERA) 150 MG/ML injection   Intramuscular   Inject 150 mg into the muscle every 3 (three) months.         . QUEtiapine (SEROQUEL) 100 MG tablet   Oral   Take 100 mg by mouth at bedtime.         . valACYclovir (VALTREX) 1000 MG  tablet   Oral   Take 1,000 mg by mouth daily as needed (for outbreaks).           BP 135/82  Pulse 88  Temp(Src) 98 F (36.7 C) (Oral)  Resp 16  SpO2 100% Physical Exam  Nursing note and vitals reviewed. Constitutional: She is oriented to person, place, and time. She appears well-developed and well-nourished.  HENT:  Head: Normocephalic.  Trush on tongue.  Eyes: Pupils are equal, round, and reactive to light.  Neck: Normal range of motion.  Cardiovascular: Normal rate and regular rhythm.   Pulmonary/Chest: Effort normal and breath sounds normal.  Abdominal: Soft. Bowel sounds are normal.  Musculoskeletal: She exhibits no edema and no tenderness.  Lymphadenopathy:    She has no cervical adenopathy.  Neurological: She is alert and oriented to person, place, and time.  Skin: Skin is warm and dry.  Psychiatric: She has a normal mood and affect. Her  behavior is normal. Judgment and thought content normal.    ED Course  Procedures (including critical care time) Labs Review Labs Reviewed  CBC WITH DIFFERENTIAL  BASIC METABOLIC PANEL   Imaging Review No results found.  EKG Interpretation   None       MDM  Persistent sore throat post tonsillectomy. No airway compromise.  Thrush on tongue.  Poor po intake due to pain.  IV rehydration in ED.  Nystatin S/S prescription.  Follow-up with ENT.    Jimmye Norman, NP 02/13/13 [phone]

## 2013-02-13 NOTE — ED Notes (Signed)
MD at bedside. 

## 2013-02-14 LAB — URINE CULTURE: Culture: NO GROWTH

## 2013-02-15 ENCOUNTER — Encounter (HOSPITAL_COMMUNITY): Payer: Self-pay | Admitting: Emergency Medicine

## 2013-02-15 ENCOUNTER — Emergency Department (HOSPITAL_COMMUNITY)
Admission: EM | Admit: 2013-02-15 | Discharge: 2013-02-15 | Disposition: A | Discharge status: Home / Self Care | Payer: Medicaid Other | Attending: Emergency Medicine | Admitting: Emergency Medicine

## 2013-02-15 DIAGNOSIS — Z79899 Other long term (current) drug therapy: Secondary | ICD-10-CM | POA: Insufficient documentation

## 2013-02-15 DIAGNOSIS — Z9089 Acquired absence of other organs: Secondary | ICD-10-CM | POA: Insufficient documentation

## 2013-02-15 DIAGNOSIS — G43909 Migraine, unspecified, not intractable, without status migrainosus: Secondary | ICD-10-CM | POA: Insufficient documentation

## 2013-02-15 DIAGNOSIS — J029 Acute pharyngitis, unspecified: Secondary | ICD-10-CM | POA: Insufficient documentation

## 2013-02-15 DIAGNOSIS — F329 Major depressive disorder, single episode, unspecified: Secondary | ICD-10-CM | POA: Insufficient documentation

## 2013-02-15 DIAGNOSIS — Z8619 Personal history of other infectious and parasitic diseases: Secondary | ICD-10-CM | POA: Insufficient documentation

## 2013-02-15 DIAGNOSIS — F411 Generalized anxiety disorder: Secondary | ICD-10-CM | POA: Insufficient documentation

## 2013-02-15 DIAGNOSIS — Z8742 Personal history of other diseases of the female genital tract: Secondary | ICD-10-CM | POA: Insufficient documentation

## 2013-02-15 DIAGNOSIS — F3289 Other specified depressive episodes: Secondary | ICD-10-CM | POA: Insufficient documentation

## 2013-02-15 DIAGNOSIS — R197 Diarrhea, unspecified: Secondary | ICD-10-CM | POA: Insufficient documentation

## 2013-02-15 DIAGNOSIS — F17200 Nicotine dependence, unspecified, uncomplicated: Secondary | ICD-10-CM | POA: Insufficient documentation

## 2013-02-15 DIAGNOSIS — K5900 Constipation, unspecified: Secondary | ICD-10-CM

## 2013-02-15 DIAGNOSIS — N390 Urinary tract infection, site not specified: Secondary | ICD-10-CM

## 2013-02-15 DIAGNOSIS — K625 Hemorrhage of anus and rectum: Secondary | ICD-10-CM | POA: Insufficient documentation

## 2013-02-15 DIAGNOSIS — Z3202 Encounter for pregnancy test, result negative: Secondary | ICD-10-CM | POA: Insufficient documentation

## 2013-02-15 LAB — CBC WITH DIFFERENTIAL/PLATELET
Basophils Absolute: 0 10*3/uL (ref 0.0–0.1)
Basophils Relative: 1 % (ref 0–1)
Eosinophils Absolute: 0.1 10*3/uL (ref 0.0–0.7)
Eosinophils Relative: 2 % (ref 0–5)
HCT: 32.2 % — ABNORMAL LOW (ref 36.0–46.0)
Hemoglobin: 10.5 g/dL — ABNORMAL LOW (ref 12.0–15.0)
Lymphocytes Relative: 36 % (ref 12–46)
Lymphs Abs: 3.2 10*3/uL (ref 0.7–4.0)
MCH: 27.3 pg (ref 26.0–34.0)
MCHC: 32.6 g/dL (ref 30.0–36.0)
MCV: 83.9 fL (ref 78.0–100.0)
Monocytes Absolute: 0.7 10*3/uL (ref 0.1–1.0)
Monocytes Relative: 8 % (ref 3–12)
Neutro Abs: 4.8 10*3/uL (ref 1.7–7.7)
Neutrophils Relative %: 54 % (ref 43–77)
Platelets: 350 10*3/uL (ref 150–400)
RBC: 3.84 MIL/uL — ABNORMAL LOW (ref 3.87–5.11)
RDW: 15.2 % (ref 11.5–15.5)
WBC: 8.9 10*3/uL (ref 4.0–10.5)

## 2013-02-15 LAB — COMPREHENSIVE METABOLIC PANEL
ALT: 9 U/L (ref 0–35)
AST: 11 U/L (ref 0–37)
Albumin: 3.4 g/dL — ABNORMAL LOW (ref 3.5–5.2)
Alkaline Phosphatase: 63 U/L (ref 39–117)
BUN: 12 mg/dL (ref 6–23)
CO2: 25 mEq/L (ref 19–32)
Calcium: 9.3 mg/dL (ref 8.4–10.5)
Chloride: 102 mEq/L (ref 96–112)
Creatinine, Ser: 0.95 mg/dL (ref 0.50–1.10)
GFR calc Af Amer: >90 mL/min (ref >90)
GFR calc non Af Amer: 82 mL/min — ABNORMAL LOW (ref >90)
Glucose, Bld: 104 mg/dL — ABNORMAL HIGH (ref 70–99)
Potassium: 3.8 mEq/L (ref 3.5–5.1)
Sodium: 138 mEq/L (ref 135–145)
Total Bilirubin: <0.1 mg/dL — ABNORMAL LOW (ref 0.3–1.2)
Total Protein: 7.3 g/dL (ref 6.0–8.3)

## 2013-02-15 LAB — URINALYSIS, ROUTINE W REFLEX MICROSCOPIC
Bilirubin Urine: NEGATIVE
Glucose, UA: NEGATIVE mg/dL
Ketones, ur: NEGATIVE mg/dL
Nitrite: NEGATIVE
Protein, ur: NEGATIVE mg/dL
Specific Gravity, Urine: 1.035 — ABNORMAL HIGH (ref 1.005–1.030)
Urobilinogen, UA: 0.2 mg/dL (ref 0.0–1.0)
pH: 5.5 (ref 5.0–8.0)

## 2013-02-15 LAB — OCCULT BLOOD, POC DEVICE: Fecal Occult Bld: POSITIVE — AB

## 2013-02-15 LAB — URINE MICROSCOPIC-ADD ON

## 2013-02-15 LAB — POCT PREGNANCY, URINE: Preg Test, Ur: NEGATIVE

## 2013-02-15 LAB — LIPASE, BLOOD: Lipase: 11 U/L (ref 11–59)

## 2013-02-15 MED ORDER — CEPHALEXIN 500 MG PO CAPS: 500.0000 mg | ORAL_CAPSULE | Freq: Two times a day (BID) | ORAL | Status: DC

## 2013-02-15 MED ORDER — ONDANSETRON HCL 4 MG/2ML IJ SOLN: 4.0000 mg | Freq: Once | INTRAMUSCULAR | Status: DC

## 2013-02-15 NOTE — ED Notes (Signed)
Pt attempting to have BM after manual disimpaction by PA

## 2013-02-15 NOTE — ED Notes (Signed)
Pt states she had her tonsils removed on the 8th. States that she was constipated after that but she took laxatives and she was able to have bowel movements.  States that she still feels constipated and her "butt hole" hurts a 10/10. States that she has not urinated since yesterday morning.  States that she feels like she has to pee but cannot when she tries.  States that she looks pregnant but she is not.  Stomach is distended.  C/o low abd pain.  States that she feels like "something is in there is plugging me up".  Also states she has been bleeding per rectum.

## 2013-02-15 NOTE — ED Provider Notes (Signed)
CSN: [id number]     Arrival date & time 02/15/13  1114 History   First MD Initiated Contact with Patient 02/15/13 1219     Chief Complaint  Patient presents with  . Constipation  . Dysuria    HPI  KOHANA AMBLE is a 27 y.o. female with a PMH of gonorrhea, chlamydia, herpes, migraines, depression, anxiety, and UTI who presents to the ED for evaluation of constipation and dysuria.  History was provided by the patient.  Patient states that she had a tonsillectomy surgery on 02/05/13 with Dr. Emeline Darling. She states that she has been struggling with constipation the past week. She states she was prescribed narcotics after her surgery which she has been taking for pain.  She states that she has been taking MiraLax throughout the week.  She states that she has been having anal leakage due to the MiraLax. She denies any profuse diarrhea.  She complains of a severe pain to her rectum. She describes it as a fullness and a mass.  She states that she gets intermittent sharp worsening pain when she tries to go to the bathroom. She also complains of some rectal bleeding however cannot comment more on this. She is unsure if it is mixed in the stool or present after whiping.  She states this pain is making her nauseated, however, denies any vomiting. She states that the pain is radiating into her lower abdomen. She also feels "bloated" and reports flatulence. She's also had the sensation of incomplete bladder voiding however denies any dysuria. No hematuria, vaginal bleeding, vaginal discharge, or genital sores. She was seen in the emergency department 2 days ago for multiple complaints.   She states that she she was prescribed a nystatin oral solution for oral thrush. She was also prescribed hydrocodone which she has continued to take.  No fevers, chills, change in appetite/activity, rhinorrhea, congestion, drooling, difficulty swallowing/breathing, chest pain, SOB, headache, dizziness, or lightheadedness.    Past Medical  History  Diagnosis Date  . Abnormal Pap smear of cervix   . Gonorrhea   . Chlamydia   . Herpes   . Pregnant state, incidental   . Migraines   . Depression   . Anxiety   . UTI (lower urinary tract infection)   . Preeclampsia    Past Surgical History  Procedure Laterality Date  . Colposcopy    . Dilation and curettage of uterus    . Tonsillectomy     Family History  Problem Relation Age of Onset  . Other Neg Hx   . Kidney disease Mother   . Cancer Paternal Grandmother    History  Substance Use Topics  . Smoking status: Current Every Day Smoker -- 0.50 packs/day    Types: Cigarettes  . Smokeless tobacco: Never Used  . Alcohol Use: Yes     Comment: social   OB History   Grav Para Term Preterm Abortions TAB SAB Ect Mult Living   7 4 4  0 3 3 0 0 0 4     Review of Systems  Constitutional: Negative for fever, chills, diaphoresis, activity change, appetite change and fatigue.  HENT: Positive for sore throat (post-surgical). Negative for congestion, drooling, ear pain, facial swelling, mouth sores, rhinorrhea and trouble swallowing.   Eyes: Negative for visual disturbance.  Respiratory: Negative for cough, shortness of breath and wheezing.   Gastrointestinal: Positive for nausea, abdominal pain, diarrhea, constipation, blood in stool, abdominal distention and anal bleeding. Negative for vomiting.  Genitourinary: Positive for difficulty  urinating. Negative for dysuria and flank pain.  Musculoskeletal: Negative for back pain, gait problem, joint swelling and myalgias.  Skin: Negative for wound.  Neurological: Negative for dizziness, syncope, weakness, light-headedness, numbness and headaches.    Allergies  Review of patient's allergies indicates no known allergies.  Home Medications   Current Outpatient Rx  Name  Route  Sig  Dispense  Refill  . acetaminophen (TYLENOL) 500 MG tablet   Oral   Take 500 mg by mouth every 6 (six) hours as needed for pain.         Marland Kitchen  HYDROcodone-acetaminophen (HYCET) 7.5-325 mg/15 ml solution   Oral   Take 15 mLs by mouth 4 (four) times daily as needed for pain.   120 mL   0   . ibuprofen (ADVIL,MOTRIN) 200 MG tablet   Oral   Take 400 mg by mouth every 8 (eight) hours as needed for pain.         . medroxyPROGESTERone (DEPO-PROVERA) 150 MG/ML injection   Intramuscular   Inject 150 mg into the muscle every 3 (three) months. Last dose July 2014         . nystatin (MYCOSTATIN) 100000 UNIT/ML suspension   Oral   Take 5 mLs (500,000 Units total) by mouth 4 (four) times daily.   60 mL   0   . QUEtiapine (SEROQUEL) 100 MG tablet   Oral   Take 100 mg by mouth at bedtime.         . valACYclovir (VALTREX) 1000 MG tablet   Oral   Take 1,000 mg by mouth daily as needed (for outbreaks).           BP 146/72  Pulse 88  Temp(Src) 98.9 F (37.2 C) (Oral)  Resp 16  SpO2 100%  LMP 01/02/2013  Filed Vitals:   02/15/13 1122 02/15/13 1440  BP: 146/72 130/81  Pulse: 88 70  Temp: 98.9 F (37.2 C) 97.8 F (36.6 C)  TempSrc: Oral Oral  Resp: 16 18  SpO2: 100% 99%   Physical Exam  Nursing note and vitals reviewed. Constitutional: She is oriented to person, place, and time. She appears well-developed and well-nourished. No distress.  Patient tearful   HENT:  Head: Normocephalic and atraumatic.  Right Ear: External ear normal.  Left Ear: External ear normal.  Nose: Nose normal.  Mouth/Throat: Oropharyngeal exudate present.  Erythema and white patches present in the posterior pharynx bilaterally.  Uvula midline.  No trismus.  Tongue has a black thick patch present.    Eyes: Conjunctivae are normal. Pupils are equal, round, and reactive to light. Right eye exhibits no discharge. Left eye exhibits no discharge.  Neck: Normal range of motion. Neck supple.  Cardiovascular: Normal rate, regular rhythm, normal heart sounds and intact distal pulses.  Exam reveals no gallop and no friction rub.   No murmur  heard. Pulmonary/Chest: Effort normal and breath sounds normal. No respiratory distress. She has no wheezes. She has no rales. She exhibits no tenderness.  Abdominal: Soft. Bowel sounds are normal. She exhibits no distension and no mass. There is no tenderness. There is no rebound and no guarding.  Genitourinary:  No anal fissures or hemorrhoids.  Firm large stool palpated in the rectal vault.  No gross blood on exam  Musculoskeletal: Normal range of motion. She exhibits no edema and no tenderness.  Neurological: She is alert and oriented to person, place, and time.  Skin: Skin is warm and dry. She is not diaphoretic.  ED Course  Fecal disimpaction Date/Time: 02/15/2013 12:55 PM Performed by: Coral Ceo K Authorized by: Jillyn Ledger Consent: Verbal consent obtained. Risks and benefits: risks, benefits and alternatives were discussed Consent given by: patient Patient identity confirmed: verbally with patient Patient sedated: no Patient tolerance: Patient tolerated the procedure well with no immediate complications. Comments: Large firm stool removed at bedside.  Multiple attempts were made to extract the stool with success.  Patient had complete relief of her symptoms after passage of a large stool burden.     (including critical care time) Labs Review Labs Reviewed  CBC WITH DIFFERENTIAL - Abnormal; Notable for the following:    RBC 3.84 (*)    Hemoglobin 10.5 (*)    HCT 32.2 (*)    All other components within normal limits  COMPREHENSIVE METABOLIC PANEL - Abnormal; Notable for the following:    Glucose, Bld 104 (*)    Albumin 3.4 (*)    GFR calc non Af Amer 82 (*)    All other components within normal limits  LIPASE, BLOOD  URINALYSIS, ROUTINE W REFLEX MICROSCOPIC   Imaging Review No results found.  EKG Interpretation   None      Results for orders placed during the hospital encounter of 02/15/13  URINE CULTURE      Result Value Range   Specimen  Description URINE, CLEAN CATCH     Special Requests NONE     Culture  Setup Time       Value: 02/15/2013 19:31     Performed at [facility] Count       Value: 95,000 COLONIES/ML     Performed at [facility]   Culture       Value: Multiple bacterial morphotypes present, none predominant. Suggest appropriate recollection if clinically indicated.     Performed at [facility]   Report Status 02/16/2013 FINAL    CBC WITH DIFFERENTIAL      Result Value Range   WBC 8.9  4.0 - 10.5 K/uL   RBC 3.84 (*) 3.87 - 5.11 MIL/uL   Hemoglobin 10.5 (*) 12.0 - 15.0 g/dL   HCT 16.1 (*) 09.6 - 04.5 %   MCV 83.9  78.0 - 100.0 fL   MCH 27.3  26.0 - 34.0 pg   MCHC 32.6  30.0 - 36.0 g/dL   RDW 40.9  81.1 - 91.4 %   Platelets 350  150 - 400 K/uL   Neutrophils Relative % 54  43 - 77 %   Neutro Abs 4.8  1.7 - 7.7 K/uL   Lymphocytes Relative 36  12 - 46 %   Lymphs Abs 3.2  0.7 - 4.0 K/uL   Monocytes Relative 8  3 - 12 %   Monocytes Absolute 0.7  0.1 - 1.0 K/uL   Eosinophils Relative 2  0 - 5 %   Eosinophils Absolute 0.1  0.0 - 0.7 K/uL   Basophils Relative 1  0 - 1 %   Basophils Absolute 0.0  0.0 - 0.1 K/uL  COMPREHENSIVE METABOLIC PANEL      Result Value Range   Sodium 138  135 - 145 mEq/L   Potassium 3.8  3.5 - 5.1 mEq/L   Chloride 102  96 - 112 mEq/L   CO2 25  19 - 32 mEq/L   Glucose, Bld 104 (*) 70 - 99 mg/dL   BUN 12  6 - 23 mg/dL   Creatinine, Ser 7.82  0.50 - 1.10  mg/dL   Calcium 9.3  8.4 - 96.0 mg/dL   Total Protein 7.3  6.0 - 8.3 g/dL   Albumin 3.4 (*) 3.5 - 5.2 g/dL   AST 11  0 - 37 U/L   ALT 9  0 - 35 U/L   Alkaline Phosphatase 63  39 - 117 U/L   Total Bilirubin <0.1 (*) 0.3 - 1.2 mg/dL   GFR calc non Af Amer 82 (*) >90 mL/min   GFR calc Af Amer >90  >90 mL/min  LIPASE, BLOOD      Result Value Range   Lipase 11  11 - 59 U/L  URINALYSIS, ROUTINE W REFLEX MICROSCOPIC      Result Value Range   Color, Urine YELLOW  YELLOW   APPearance CLOUDY (*)  CLEAR   Specific Gravity, Urine 1.035 (*) 1.005 - 1.030   pH 5.5  5.0 - 8.0   Glucose, UA NEGATIVE  NEGATIVE mg/dL   Hgb urine dipstick SMALL (*) NEGATIVE   Bilirubin Urine NEGATIVE  NEGATIVE   Ketones, ur NEGATIVE  NEGATIVE mg/dL   Protein, ur NEGATIVE  NEGATIVE mg/dL   Urobilinogen, UA 0.2  0.0 - 1.0 mg/dL   Nitrite NEGATIVE  NEGATIVE   Leukocytes, UA MODERATE (*) NEGATIVE  URINE MICROSCOPIC-ADD ON      Result Value Range   Squamous Epithelial / LPF FEW (*) RARE   WBC, UA 7-10  <3 WBC/hpf   RBC / HPF 3-6  <3 RBC/hpf   Bacteria, UA FEW (*) RARE   Urine-Other FEW YEAST    OCCULT BLOOD, POC DEVICE      Result Value Range   Fecal Occult Bld POSITIVE (*) NEGATIVE  POCT PREGNANCY, URINE      Result Value Range   Preg Test, Ur NEGATIVE  NEGATIVE     MDM   1. UTI (urinary tract infection)   2. Constipation     SHIARA MCGOUGH is a 27 y.o. female with a PMH of gonorrhea, chlamydia, herpes, migraines, depression, anxiety, and UTI who presents to the ED for evaluation of constipation and dysuria.  Pregnancy, occult blood, UA, CBC, CMP, lipase ordered.      Rechecks  12:45 PM = Attempts at fecal disimpaction performed at bedside with RN.   1:07 PM = Patient had a small bowel movement.  Another attempt to disimpact was done. Will re-check  1:45 PM = Patient able to pass stool.  She states she had a large bowel movement with a firm hard stool.  She states she had some soft diarrhea afterwards with no gross blood.  Abdominal exam benign.  States she feels much better.  Patient smiling and moving around the room with ease.      Etiology of abdominal and rectal pain is likely due to constipation.  Patient was disimpacted in the ED with complete relief of her symptoms after defecation.  Patient also had complaints of difficulty emptying her bladder.  Her urine showed moderate leukocytes and was sent for culture.  Patient treated with Keflex for possible UTI.  Her abdominal exam was benign.   Patient was found to have occult blood in her stool.  This may be due to constipation. She also has mild anemia, however, suspicion for GI bleed is low at this time.  Patient was informed of these results and instructed to follow-up with her PCP for repeat testing.  Patient was instructed to return to the ED if they experience any fever, severe/worsening pain, repeated vomiting, or  other concerns.  She was encouraged to continue taking Miralax and drink plenty of fluids.  She was also instructed to follow-up with her ENT.  Patient was in agreement with discharge and plan.     Final impressions: 1. Constipation  2. UTI      Luiz Iron PA-C         Jillyn Ledger, PA-C 02/16/13 2117

## 2013-02-16 LAB — URINE CULTURE: Colony Count: 95000

## 2013-02-20 NOTE — ED Provider Notes (Signed)
Medical screening examination/treatment/procedure(s) were performed by non-physician practitioner and as supervising physician I was immediately available for consultation/collaboration.  EKG Interpretation   None        Raeford Razor, MD 02/20/13 1254

## 2013-06-20 ENCOUNTER — Emergency Department (HOSPITAL_COMMUNITY)
Admission: EM | Admit: 2013-06-20 | Discharge: 2013-06-20 | Disposition: A | Discharge status: Home / Self Care | Payer: Medicaid Other | Attending: Emergency Medicine | Admitting: Emergency Medicine

## 2013-06-20 ENCOUNTER — Encounter (HOSPITAL_COMMUNITY): Payer: Self-pay | Admitting: Emergency Medicine

## 2013-06-20 DIAGNOSIS — Z8679 Personal history of other diseases of the circulatory system: Secondary | ICD-10-CM | POA: Insufficient documentation

## 2013-06-20 DIAGNOSIS — B9789 Other viral agents as the cause of diseases classified elsewhere: Secondary | ICD-10-CM

## 2013-06-20 DIAGNOSIS — H9209 Otalgia, unspecified ear: Secondary | ICD-10-CM | POA: Insufficient documentation

## 2013-06-20 DIAGNOSIS — F17200 Nicotine dependence, unspecified, uncomplicated: Secondary | ICD-10-CM | POA: Insufficient documentation

## 2013-06-20 DIAGNOSIS — Z87440 Personal history of urinary (tract) infections: Secondary | ICD-10-CM | POA: Insufficient documentation

## 2013-06-20 DIAGNOSIS — Z8619 Personal history of other infectious and parasitic diseases: Secondary | ICD-10-CM | POA: Insufficient documentation

## 2013-06-20 DIAGNOSIS — J069 Acute upper respiratory infection, unspecified: Secondary | ICD-10-CM

## 2013-06-20 DIAGNOSIS — Z3202 Encounter for pregnancy test, result negative: Secondary | ICD-10-CM | POA: Insufficient documentation

## 2013-06-20 DIAGNOSIS — Z8659 Personal history of other mental and behavioral disorders: Secondary | ICD-10-CM | POA: Insufficient documentation

## 2013-06-20 LAB — URINALYSIS, ROUTINE W REFLEX MICROSCOPIC
Bilirubin Urine: NEGATIVE
Glucose, UA: NEGATIVE mg/dL
Hgb urine dipstick: NEGATIVE
Ketones, ur: NEGATIVE mg/dL
Leukocytes, UA: NEGATIVE
Nitrite: NEGATIVE
Protein, ur: NEGATIVE mg/dL
Specific Gravity, Urine: 1.019 (ref 1.005–1.030)
Urobilinogen, UA: 0.2 mg/dL (ref 0.0–1.0)
pH: 6.5 (ref 5.0–8.0)

## 2013-06-20 LAB — POC URINE PREG, ED: Preg Test, Ur: NEGATIVE

## 2013-06-20 LAB — RAPID STREP SCREEN (MED CTR MEBANE ONLY): Streptococcus, Group A Screen (Direct): NEGATIVE

## 2013-06-20 MED ORDER — GUAIFENESIN ER 1200 MG PO TB12: 1.0000 | ORAL_TABLET | Freq: Two times a day (BID) | ORAL | Status: DC

## 2013-06-20 MED ORDER — ACETAMINOPHEN-CODEINE 120-12 MG/5ML PO SOLN: 10.0000 mL | ORAL | Status: DC | PRN

## 2013-06-20 MED ORDER — IBUPROFEN 800 MG PO TABS: 800.0000 mg | ORAL_TABLET | Freq: Three times a day (TID) | ORAL | Status: DC | PRN

## 2013-06-20 NOTE — Discharge Instructions (Signed)
Return here as needed. Your urine testing was normal. Increase your fluid intake.

## 2013-06-20 NOTE — ED Notes (Signed)
Pt c/o increasing bilateral ear pain, sore throat, and fever x 1 week and urine odor x 2 weeks.  Pain score 8/10.  Pt reports that her daughter recently had bilateral and eye infections.

## 2013-06-20 NOTE — ED Provider Notes (Signed)
CSN: [id number]     Arrival date & time 06/20/13  1153 History  This chart was scribed for non-physician practitioner, Caralyn Guile, working with Tanna Furry, MD by Celesta Gentile, ED Scribe. This patient was seen in room WTR5/WTR5 and the patient's care was started at 12:53 PM.  Chief Complaint  Patient presents with  . Otalgia  . Sore Throat  . Fever   The history is provided by the patient. No language interpreter was used.   HPI Comments: Robin Horn is a 28 y.o. female who presents to the Emergency Department complaining of a constant worsening sore throat with associated bilateral otalgia and subjective fever that started about 7 days ago.  ED temperature is currently 98.46F.  Pt also states her urine has been odorous for about 2 weeks, but denies any other urinary symptoms.  She reports she has tried Tylenol Cold and Flu without relief.  She states she was taking Seroquel, but stopped taking it last week.  Pt denies any other medical problems.    Past Medical History  Diagnosis Date  . Abnormal Pap smear of cervix   . Gonorrhea   . Chlamydia   . Herpes   . Pregnant state, incidental   . Migraines   . Depression   . Anxiety   . UTI (lower urinary tract infection)   . Preeclampsia    Past Surgical History  Procedure Laterality Date  . Colposcopy    . Dilation and curettage of uterus    . Tonsillectomy     Family History  Problem Relation Age of Onset  . Other Neg Hx   . Kidney disease Mother   . Cancer Paternal Grandmother    History  Substance Use Topics  . Smoking status: Current Every Day Smoker -- 0.25 packs/day    Types: Cigarettes  . Smokeless tobacco: Never Used  . Alcohol Use: Yes     Comment: social   OB History   Grav Para Term Preterm Abortions TAB SAB Ect Mult Living   7 4 4  0 3 3 0 0 0 4     Review of Systems  Constitutional: Positive for fever. Negative for chills.  HENT: Positive for ear pain and sore throat. Negative for congestion  and rhinorrhea.   Respiratory: Negative for cough and shortness of breath.   Cardiovascular: Negative for chest pain.  Gastrointestinal: Negative for nausea, vomiting, abdominal pain and diarrhea.  Genitourinary: Negative for dysuria.  Musculoskeletal: Negative for back pain.  Skin: Negative for color change and rash.  Neurological: Negative for syncope and headaches.  Psychiatric/Behavioral: Negative for behavioral problems and confusion.  All other systems reviewed and are negative.   Allergies  Review of patient's allergies indicates no known allergies.  Home Medications   Current Outpatient Rx  Name  Route  Sig  Dispense  Refill  . DM-Phenylephrine-Acetaminophen (TYLENOL COLD MULTI-SYMPTOM DAY PO)   Oral   Take 30 mLs by mouth every 6 (six) hours as needed (pain, cough).         Marland Kitchen ibuprofen (ADVIL,MOTRIN) 200 MG tablet   Oral   Take 800 mg by mouth every 4 (four) hours as needed.          Marland Kitchen oxymetazoline (AFRIN) 0.05 % nasal spray   Each Nare   Place 2 sprays into both nostrils 2 (two) times daily as needed for congestion.         . valACYclovir (VALTREX) 1000 MG tablet   Oral   Take  1,000 mg by mouth daily as needed (for outbreaks).           Triage Vitals: BP 137/83  Pulse 80  Temp(Src) 98.5 F (36.9 C) (Oral)  Resp 14  Ht 5\' 1"  (1.549 m)  Wt 174 lb 8 oz (79.153 kg)  BMI 32.99 kg/m2  SpO2 96%  Physical Exam  Nursing note and vitals reviewed. Constitutional: She is oriented to person, place, and time. She appears well-developed and well-nourished. No distress.  HENT:  Head: Normocephalic and atraumatic.  Right Ear: Tympanic membrane, external ear and ear canal normal.  Left Ear: Tympanic membrane, external ear and ear canal normal.  Nose: Nose normal. No mucosal edema or rhinorrhea.  Mouth/Throat: Uvula is midline, oropharynx is clear and moist and mucous membranes are normal. No oropharyngeal exudate, posterior oropharyngeal edema or posterior  oropharyngeal erythema.  Eyes: Conjunctivae and EOM are normal. Right eye exhibits no discharge. Left eye exhibits no discharge.  Neck: Neck supple. No tracheal deviation present.  Cardiovascular: Normal rate, regular rhythm and normal heart sounds.  Exam reveals no gallop and no friction rub.   No murmur heard. Pulmonary/Chest: Effort normal. No respiratory distress. She has no wheezes. She has no rales.  Musculoskeletal: Normal range of motion.  Lymphadenopathy:       Head (right side): No submandibular, no tonsillar, no preauricular, no posterior auricular and no occipital adenopathy present.       Head (left side): No submandibular, no tonsillar, no preauricular, no posterior auricular and no occipital adenopathy present.    She has cervical adenopathy (mild tenderness).       Right: No supraclavicular adenopathy present.       Left: No supraclavicular adenopathy present.  Neurological: She is alert and oriented to person, place, and time.  Skin: Skin is warm and dry. No rash noted.  Psychiatric: She has a normal mood and affect. Her behavior is normal. Judgment and thought content normal.    ED Course  Procedures (including critical care time) DIAGNOSTIC STUDIES: Oxygen Saturation is 96% on RA, adequate by my interpretation.    COORDINATION OF CARE: 12:59 PM-Will order UA and Strep screen.  Patient informed of current plan of treatment and evaluation and agrees with plan.    Results for orders placed during the hospital encounter of 06/20/13  RAPID STREP SCREEN      Result Value Ref Range   Streptococcus, Group A Screen (Direct) NEGATIVE  NEGATIVE  URINALYSIS, ROUTINE W REFLEX MICROSCOPIC      Result Value Ref Range   Color, Urine YELLOW  YELLOW   APPearance CLEAR  CLEAR   Specific Gravity, Urine 1.019  1.005 - 1.030   pH 6.5  5.0 - 8.0   Glucose, UA NEGATIVE  NEGATIVE mg/dL   Hgb urine dipstick NEGATIVE  NEGATIVE   Bilirubin Urine NEGATIVE  NEGATIVE   Ketones, ur NEGATIVE   NEGATIVE mg/dL   Protein, ur NEGATIVE  NEGATIVE mg/dL   Urobilinogen, UA 0.2  0.0 - 1.0 mg/dL   Nitrite NEGATIVE  NEGATIVE   Leukocytes, UA NEGATIVE  NEGATIVE  POC URINE PREG, ED      Result Value Ref Range   Preg Test, Ur NEGATIVE  NEGATIVE     I personally performed the services described in this documentation, which was scribed in my presence. The recorded information has been reviewed and is accurate.     Brent General, PA-C 06/20/13 [phone]

## 2013-06-22 LAB — CULTURE, GROUP A STREP

## 2013-06-23 NOTE — ED Provider Notes (Signed)
Medical screening examination/treatment/procedure(s) were performed by non-physician practitioner and as supervising physician I was immediately available for consultation/collaboration.  EKG Interpretation   None         Tanna Furry, MD 06/23/13 [phone]

## 2013-07-01 ENCOUNTER — Inpatient Hospital Stay (HOSPITAL_COMMUNITY): Payer: Medicaid Other

## 2013-07-01 ENCOUNTER — Inpatient Hospital Stay (HOSPITAL_COMMUNITY)
Admission: AD | Admit: 2013-07-01 | Discharge: 2013-07-01 | Disposition: A | Discharge status: Home / Self Care | Payer: Medicaid Other | Source: Ambulatory Visit | Attending: Obstetrics and Gynecology | Admitting: Obstetrics and Gynecology

## 2013-07-01 ENCOUNTER — Encounter (HOSPITAL_COMMUNITY): Payer: Self-pay | Admitting: *Deleted

## 2013-07-01 DIAGNOSIS — A499 Bacterial infection, unspecified: Secondary | ICD-10-CM | POA: Insufficient documentation

## 2013-07-01 DIAGNOSIS — B9689 Other specified bacterial agents as the cause of diseases classified elsewhere: Secondary | ICD-10-CM | POA: Insufficient documentation

## 2013-07-01 DIAGNOSIS — R109 Unspecified abdominal pain: Secondary | ICD-10-CM | POA: Insufficient documentation

## 2013-07-01 DIAGNOSIS — F17200 Nicotine dependence, unspecified, uncomplicated: Secondary | ICD-10-CM | POA: Insufficient documentation

## 2013-07-01 DIAGNOSIS — N83209 Unspecified ovarian cyst, unspecified side: Secondary | ICD-10-CM

## 2013-07-01 DIAGNOSIS — N760 Acute vaginitis: Secondary | ICD-10-CM

## 2013-07-01 DIAGNOSIS — N912 Amenorrhea, unspecified: Secondary | ICD-10-CM

## 2013-07-01 DIAGNOSIS — N949 Unspecified condition associated with female genital organs and menstrual cycle: Secondary | ICD-10-CM | POA: Insufficient documentation

## 2013-07-01 HISTORY — DX: Unspecified ovarian cyst, unspecified side: N83.209

## 2013-07-01 LAB — URINALYSIS, ROUTINE W REFLEX MICROSCOPIC
Bilirubin Urine: NEGATIVE
Glucose, UA: NEGATIVE mg/dL
Hgb urine dipstick: NEGATIVE
Ketones, ur: NEGATIVE mg/dL
LEUKOCYTES UA: NEGATIVE
Nitrite: NEGATIVE
PH: 5.5 (ref 5.0–8.0)
Protein, ur: NEGATIVE mg/dL
Urobilinogen, UA: 0.2 mg/dL (ref 0.0–1.0)

## 2013-07-01 LAB — CBC
HCT: 36.1 % (ref 36.0–46.0)
HEMOGLOBIN: 12 g/dL (ref 12.0–15.0)
MCH: 28.2 pg (ref 26.0–34.0)
MCHC: 33.2 g/dL (ref 30.0–36.0)
MCV: 84.7 fL (ref 78.0–100.0)
Platelets: 266 10*3/uL (ref 150–400)
RBC: 4.26 MIL/uL (ref 3.87–5.11)
RDW: 13.8 % (ref 11.5–15.5)
WBC: 9.6 10*3/uL (ref 4.0–10.5)

## 2013-07-01 LAB — WET PREP, GENITAL
Trich, Wet Prep: NONE SEEN
WBC, Wet Prep HPF POC: NONE SEEN
YEAST WET PREP: NONE SEEN

## 2013-07-01 LAB — POCT PREGNANCY, URINE: Preg Test, Ur: NEGATIVE

## 2013-07-01 MED ORDER — OXYCODONE-ACETAMINOPHEN 5-325 MG PO TABS: 1.0000 | ORAL_TABLET | ORAL | Status: DC | PRN

## 2013-07-01 MED ORDER — KETOROLAC TROMETHAMINE 60 MG/2ML IM SOLN
60.0000 mg | Freq: Once | INTRAMUSCULAR | Status: AC
  Administered 2013-07-01: 60 mg via INTRAMUSCULAR
  Filled 2013-07-01: qty 2

## 2013-07-01 MED ORDER — OXYCODONE-ACETAMINOPHEN 5-325 MG PO TABS
1.0000 | ORAL_TABLET | Freq: Once | ORAL | Status: AC
  Administered 2013-07-01: 1 via ORAL
  Filled 2013-07-01: qty 1

## 2013-07-01 MED ORDER — IBUPROFEN 600 MG PO TABS: 600.0000 mg | ORAL_TABLET | Freq: Four times a day (QID) | ORAL | Status: DC | PRN

## 2013-07-01 MED ORDER — METRONIDAZOLE 500 MG PO TABS: 500.0000 mg | ORAL_TABLET | Freq: Two times a day (BID) | ORAL | Status: DC

## 2013-07-01 NOTE — Discharge Instructions (Signed)

## 2013-07-01 NOTE — MAU Provider Note (Signed)
History     CSN: [id number]  Arrival date and time: 07/01/13 7253   First Provider Initiated Contact with Patient 07/01/13 1024      Chief Complaint  Patient presents with  . Abdominal Pain   HPI  [name] is a 28 y.o. female [phone] who presents with abdominal/pelvic pain that started 2 days ago following intercourse. Pt has tried goody powder without relief. The pain became so bad yesterday that she vomited; no vomiting today. Pt says the pain comes and goes and feels similar to the pain she had when she was told she had an ovarian cyst.  Pt was seen by a Dr. In December for amenorrhea. Pt was given a dose of provera to start her cycles and patient says she has not had a cycle following the provera.   OB History   Grav Para Term Preterm Abortions TAB SAB Ect Mult Living   7 4 4  0 3 3 0 0 0 4      Past Medical History  Diagnosis Date  . Abnormal Pap smear of cervix   . Gonorrhea   . Chlamydia   . Herpes   . Pregnant state, incidental   . Migraines   . Depression   . Anxiety   . UTI (lower urinary tract infection)   . Preeclampsia   . Ovarian cyst     Past Surgical History  Procedure Laterality Date  . Colposcopy    . Dilation and curettage of uterus    . Tonsillectomy      Family History  Problem Relation Age of Onset  . Other Neg Hx   . Kidney disease Mother   . Cancer Paternal Grandmother     History  Substance Use Topics  . Smoking status: Current Every Day Smoker -- 0.25 packs/day    Types: Cigarettes  . Smokeless tobacco: Never Used  . Alcohol Use: Yes     Comment: social    Allergies: No Known Allergies  Prescriptions prior to admission  Medication Sig Dispense Refill  . Diphenhydramine-APAP, sleep, (GOODYS PM) 38-500 MG PACK Take 3 packets by mouth at bedtime as needed (for pain and sleep).      Marland Kitchen DM-Phenylephrine-Acetaminophen (TYLENOL COLD MULTI-SYMPTOM DAY PO) Take 20 mLs by mouth 3 (three) times daily as needed (pain, cough).        . Ibuprofen-Diphenhydramine Cit (ADVIL PM PO) Take 2 tablets by mouth at bedtime as needed (for pain and sleep).      Marland Kitchen oxymetazoline (AFRIN) 0.05 % nasal spray Place 2 sprays into both nostrils 2 (two) times daily as needed for congestion.      . QUEtiapine (SEROQUEL) 100 MG tablet Take 200 mg by mouth at bedtime.       Results for orders placed during the hospital encounter of 07/01/13 (from the past 48 hour(s))  URINALYSIS, ROUTINE W REFLEX MICROSCOPIC     Status: Abnormal   Collection Time    07/01/13  9:59 AM      Result Value Ref Range   Color, Urine YELLOW  YELLOW   APPearance CLEAR  CLEAR   Specific Gravity, Urine >1.030 (*) 1.005 - 1.030   pH 5.5  5.0 - 8.0   Glucose, UA NEGATIVE  NEGATIVE mg/dL   Hgb urine dipstick NEGATIVE  NEGATIVE   Bilirubin Urine NEGATIVE  NEGATIVE   Ketones, ur NEGATIVE  NEGATIVE mg/dL   Protein, ur NEGATIVE  NEGATIVE mg/dL   Urobilinogen, UA 0.2  0.0 -  1.0 mg/dL   Nitrite NEGATIVE  NEGATIVE   Leukocytes, UA NEGATIVE  NEGATIVE   Comment: MICROSCOPIC NOT DONE ON URINES WITH NEGATIVE PROTEIN, BLOOD, LEUKOCYTES, NITRITE, OR GLUCOSE <1000 mg/dL.  CBC     Status: None   Collection Time    07/01/13 10:22 AM      Result Value Ref Range   WBC 9.6  4.0 - 10.5 K/uL   RBC 4.26  3.87 - 5.11 MIL/uL   Hemoglobin 12.0  12.0 - 15.0 g/dL   HCT 36.1  36.0 - 46.0 %   MCV 84.7  78.0 - 100.0 fL   MCH 28.2  26.0 - 34.0 pg   MCHC 33.2  30.0 - 36.0 g/dL   RDW 13.8  11.5 - 15.5 %   Platelets 266  150 - 400 K/uL  POCT PREGNANCY, URINE     Status: None   Collection Time    07/01/13 10:23 AM      Result Value Ref Range   Preg Test, Ur NEGATIVE  NEGATIVE   Comment:            THE SENSITIVITY OF THIS     METHODOLOGY IS >24 mIU/mL  WET PREP, GENITAL     Status: Abnormal   Collection Time    07/01/13 10:35 AM      Result Value Ref Range   Yeast Wet Prep HPF POC NONE SEEN  NONE SEEN   Trich, Wet Prep NONE SEEN  NONE SEEN   Clue Cells Wet Prep HPF POC MANY (*) NONE  SEEN   WBC, Wet Prep HPF POC NONE SEEN  NONE SEEN   Comment: MODERATE BACTERIA SEEN    US Transvaginal Non-ob  07/01/2013   CLINICAL DATA:  28 year old female with right lower abdominal pain. Initial encounter.  EXAM: TRANSABDOMINAL AND TRANSVAGINAL ULTRASOUND OF PELVIS  DOPPLER ULTRASOUND OF OVARIES  TECHNIQUE: Both transabdominal and transvaginal ultrasound examinations of the pelvis were performed. Transabdominal technique was performed for global imaging of the pelvis including uterus, ovaries, adnexal regions, and pelvic cul-de-sac.  It was necessary to proceed with endovaginal exam following the transabdominal exam to visualize the ovaries. Color and duplex Doppler ultrasound was utilized to evaluate blood flow to the ovaries.  COMPARISON:  Ob ultrasound 01/18/2011.  FINDINGS: Uterus  Measurements: 7.1 x 4.6 x 4.8 cm. No fibroids or other mass visualized. Incidental nabothian cysts lower uterine segment.  Endometrium  Thickness: 3 mm. Incidental arcuate configuration. No focal abnormality visualized.  Right ovary  Measurements: 3.7 x 1.5 x 2.1 cm. Normal appearance/no adnexal mass.  Left ovary  Measurements: 4.4 x 2.7 x 4.1 cm. Evidence of a collapsing cyst with no hypervascularity (image 48). Normal appearance overall, No adnexal mass.  Pulsed Doppler evaluation of both ovaries demonstrates normal low-resistance arterial and venous waveforms.  Other findings  Trace simple appearing free fluid.  IMPRESSION: Negative study with no ovarian torsion.   Electronically Signed   By: Lars Pinks M.D.   On: 07/01/2013 12:09   US Pelvis Complete  07/01/2013   CLINICAL DATA:  28 year old female with right lower abdominal pain. Initial encounter.  EXAM: TRANSABDOMINAL AND TRANSVAGINAL ULTRASOUND OF PELVIS  DOPPLER ULTRASOUND OF OVARIES  TECHNIQUE: Both transabdominal and transvaginal ultrasound examinations of the pelvis were performed. Transabdominal technique was performed for global imaging of the pelvis including  uterus, ovaries, adnexal regions, and pelvic cul-de-sac.  It was necessary to proceed with endovaginal exam following the transabdominal exam to visualize the ovaries. Color and duplex  Doppler ultrasound was utilized to evaluate blood flow to the ovaries.  COMPARISON:  Ob ultrasound 01/18/2011.  FINDINGS: Uterus  Measurements: 7.1 x 4.6 x 4.8 cm. No fibroids or other mass visualized. Incidental nabothian cysts lower uterine segment.  Endometrium  Thickness: 3 mm. Incidental arcuate configuration. No focal abnormality visualized.  Right ovary  Measurements: 3.7 x 1.5 x 2.1 cm. Normal appearance/no adnexal mass.  Left ovary  Measurements: 4.4 x 2.7 x 4.1 cm. Evidence of a collapsing cyst with no hypervascularity (image 48). Normal appearance overall, No adnexal mass.  Pulsed Doppler evaluation of both ovaries demonstrates normal low-resistance arterial and venous waveforms.  Other findings  Trace simple appearing free fluid.  IMPRESSION: Negative study with no ovarian torsion.   Electronically Signed   By: Lars Pinks M.D.   On: 07/01/2013 12:09   [place] Art/ven Flow Abd Pelv Doppler  07/01/2013   CLINICAL DATA:  28 year old female with right lower abdominal pain. Initial encounter.  EXAM: TRANSABDOMINAL AND TRANSVAGINAL ULTRASOUND OF PELVIS  DOPPLER ULTRASOUND OF OVARIES  TECHNIQUE: Both transabdominal and transvaginal ultrasound examinations of the pelvis were performed. Transabdominal technique was performed for global imaging of the pelvis including uterus, ovaries, adnexal regions, and pelvic cul-de-sac.  It was necessary to proceed with endovaginal exam following the transabdominal exam to visualize the ovaries. Color and duplex Doppler ultrasound was utilized to evaluate blood flow to the ovaries.  COMPARISON:  Ob ultrasound 01/18/2011.  FINDINGS: Uterus  Measurements: 7.1 x 4.6 x 4.8 cm. No fibroids or other mass visualized. Incidental nabothian cysts lower uterine segment.  Endometrium  Thickness: 3 mm.  Incidental arcuate configuration. No focal abnormality visualized.  Right ovary  Measurements: 3.7 x 1.5 x 2.1 cm. Normal appearance/no adnexal mass.  Left ovary  Measurements: 4.4 x 2.7 x 4.1 cm. Evidence of a collapsing cyst with no hypervascularity (image 48). Normal appearance overall, No adnexal mass.  Pulsed Doppler evaluation of both ovaries demonstrates normal low-resistance arterial and venous waveforms.  Other findings  Trace simple appearing free fluid.  IMPRESSION: Negative study with no ovarian torsion.   Electronically Signed   By: Lars Pinks M.D.   On: 07/01/2013 12:09    Review of Systems  Constitutional: Negative for fever and chills.  Gastrointestinal: Positive for nausea, vomiting and abdominal pain (Right > Left. Pt feels the pain all over in her lower abdomen. ). Negative for diarrhea and constipation.  Genitourinary: Negative for dysuria, urgency, frequency and hematuria.       + vaginal discharge odorous No vaginal bleeding. No dysuria.    Physical Exam   Blood pressure 140/86, pulse 91, temperature 99.2 F (37.3 C), temperature source Oral, resp. rate 18, height 5\' 1"  (1.549 m), weight 78.926 kg (174 lb).  Physical Exam  Constitutional: She is oriented to person, place, and time. She appears well-developed and well-nourished. No distress.  HENT:  Head: Normocephalic.  Eyes: Pupils are equal, round, and reactive to light.  Neck: Neck supple.  Respiratory: Effort normal.  GI: Normal appearance. There is tenderness in the right lower quadrant, suprapubic area and left lower quadrant. There is no rebound.  Neurological: She is alert and oriented to person, place, and time.  Skin: Skin is warm. She is not diaphoretic.  Psychiatric: Her behavior is normal.    MAU Course  Procedures None  MDM CBC UA  Upt Pelvic US complete Toradol; minimal relief, patient rates her pain 8/10 Percocet 1 tab given in MAU  Patient rates her pain 4/10  following percocet     Assessment and Plan   A:  Left ovarian collapsing cyst with trace simple fluid Bacterial vaginosis  Amenorrhea   P:  Discharge home in stable condition RX: Flagyl         Percocet  Referral to clinic made  Return to MAU as needed, if symptoms worsen    Darrelyn Hillock Jurney Overacker, NP  07/01/2013, 3:42 PM

## 2013-07-01 NOTE — MAU Note (Signed)
C/o R lower pelvic pain for past 2days; pain is progressively worse;

## 2013-07-02 LAB — GC/CHLAMYDIA PROBE AMP
CT Probe RNA: NEGATIVE
GC Probe RNA: NEGATIVE

## 2013-07-07 NOTE — MAU Provider Note (Signed)
Attestation of Attending Supervision of Advanced Practitioner (CNM/NP): Evaluation and management procedures were performed by the Advanced Practitioner under my supervision and collaboration.  I have reviewed the Advanced Practitioner's note and chart, and I agree with the management and plan.  Breia Ocampo 07/07/2013 12:45 PM

## 2013-07-21 ENCOUNTER — Emergency Department (HOSPITAL_COMMUNITY)
Admission: EM | Admit: 2013-07-21 | Discharge: 2013-07-21 | Disposition: A | Discharge status: Home / Self Care | Payer: Medicaid Other | Attending: Emergency Medicine | Admitting: Emergency Medicine

## 2013-07-21 DIAGNOSIS — Z792 Long term (current) use of antibiotics: Secondary | ICD-10-CM | POA: Insufficient documentation

## 2013-07-21 DIAGNOSIS — F329 Major depressive disorder, single episode, unspecified: Secondary | ICD-10-CM | POA: Insufficient documentation

## 2013-07-21 DIAGNOSIS — Z79899 Other long term (current) drug therapy: Secondary | ICD-10-CM | POA: Insufficient documentation

## 2013-07-21 DIAGNOSIS — L02429 Furuncle of limb, unspecified: Secondary | ICD-10-CM

## 2013-07-21 DIAGNOSIS — F17200 Nicotine dependence, unspecified, uncomplicated: Secondary | ICD-10-CM | POA: Insufficient documentation

## 2013-07-21 DIAGNOSIS — Z87440 Personal history of urinary (tract) infections: Secondary | ICD-10-CM | POA: Insufficient documentation

## 2013-07-21 DIAGNOSIS — L02439 Carbuncle of limb, unspecified: Secondary | ICD-10-CM

## 2013-07-21 DIAGNOSIS — J029 Acute pharyngitis, unspecified: Secondary | ICD-10-CM | POA: Insufficient documentation

## 2013-07-21 DIAGNOSIS — H9209 Otalgia, unspecified ear: Secondary | ICD-10-CM | POA: Insufficient documentation

## 2013-07-21 DIAGNOSIS — F3289 Other specified depressive episodes: Secondary | ICD-10-CM | POA: Insufficient documentation

## 2013-07-21 DIAGNOSIS — Z8679 Personal history of other diseases of the circulatory system: Secondary | ICD-10-CM | POA: Insufficient documentation

## 2013-07-21 DIAGNOSIS — Z8742 Personal history of other diseases of the female genital tract: Secondary | ICD-10-CM | POA: Insufficient documentation

## 2013-07-21 DIAGNOSIS — Z8619 Personal history of other infectious and parasitic diseases: Secondary | ICD-10-CM | POA: Insufficient documentation

## 2013-07-21 NOTE — ED Provider Notes (Signed)
CSN: [id number]     Arrival date & time 07/21/13  5573 History  This chart was scribed for non-physician practitioner, Quincy Carnes, PA-C, working with Ephraim Hamburger, MD, by Sydell Axon, ED Scribe. This patient was seen in room TR09C/TR09C and the patient's care was started at 9:32 AM   The history is provided by the patient. No language interpreter was used.   HPI Comments: Robin Horn is a 28 y.o. female who presents to the Emergency Department with a chief complaint of sore throat and R ear pain with onset 2 days ago. 4 ibuprofen PM last night with worsening symptoms, including chills. Patient denies any fever, nausea, vomiting, or diarrhea. Patient states her children were sick with similar symptoms recently. Patient also reports noticing a recurring small boil under her R arm pit with onset 2 days ago. Patient states that she has been given antibiotics for this by her PCP in the past with temporary relief; however, patient reports that this symptom typically returns when she becomes sick. She denies taking any medication for her symptoms today.  VS stable on arrival.  Past Medical History  Diagnosis Date  . Abnormal Pap smear of cervix   . Gonorrhea   . Chlamydia   . Herpes   . Pregnant state, incidental   . Migraines   . Depression   . Anxiety   . UTI (lower urinary tract infection)   . Preeclampsia   . Ovarian cyst    Past Surgical History  Procedure Laterality Date  . Colposcopy    . Dilation and curettage of uterus    . Tonsillectomy     Family History  Problem Relation Age of Onset  . Other Neg Hx   . Kidney disease Mother   . Cancer Paternal Grandmother    History  Substance Use Topics  . Smoking status: Current Every Day Smoker -- 0.25 packs/day    Types: Cigarettes  . Smokeless tobacco: Never Used  . Alcohol Use: Yes     Comment: social   OB History   Grav Para Term Preterm Abortions TAB SAB Ect Mult Living   7 4 4  0 3 3 0 0 0 4     Review of Systems   Constitutional: Positive for chills. Negative for fever, activity change, appetite change and unexpected weight change.  HENT: Positive for ear pain and sore throat.   Respiratory: Negative for cough, chest tightness and shortness of breath.   Cardiovascular: Negative for chest pain.  Gastrointestinal: Negative for nausea, vomiting, abdominal pain and diarrhea.  Musculoskeletal: Negative for arthralgias, back pain, myalgias and neck pain.  Skin:       Boil under R arm pit  Neurological: Negative for weakness.  All other systems reviewed and are negative.   Allergies  Review of patient's allergies indicates no known allergies.  Home Medications   Current Outpatient Rx  Name  Route  Sig  Dispense  Refill  . Diphenhydramine-APAP, sleep, (GOODYS PM) 38-500 MG PACK   Oral   Take 3 packets by mouth at bedtime as needed (for pain and sleep).         Marland Kitchen DM-Phenylephrine-Acetaminophen (TYLENOL COLD MULTI-SYMPTOM DAY PO)   Oral   Take 20 mLs by mouth 3 (three) times daily as needed (pain, cough).          Marland Kitchen ibuprofen (ADVIL,MOTRIN) 600 MG tablet   Oral   Take 1 tablet (600 mg total) by mouth every 6 (six) hours as needed.  30 tablet   0   . metroNIDAZOLE (FLAGYL) 500 MG tablet   Oral   Take 1 tablet (500 mg total) by mouth 2 (two) times daily.   14 tablet   0   . oxyCODONE-acetaminophen (PERCOCET/ROXICET) 5-325 MG per tablet   Oral   Take 1 tablet by mouth every 4 (four) hours as needed for severe pain.   15 tablet   0   . oxymetazoline (AFRIN) 0.05 % nasal spray   Each Nare   Place 2 sprays into both nostrils 2 (two) times daily as needed for congestion.         . QUEtiapine (SEROQUEL) 100 MG tablet   Oral   Take 200 mg by mouth at bedtime.          Triage Vitals: BP 126/81  Pulse 86  Temp(Src) 98 F (36.7 C) (Oral)  Resp 18  Ht 5\' 1"  (1.549 m)  Wt 170 lb (77.111 kg)  BMI 32.14 kg/m2  SpO2 100%  Physical Exam  Nursing note and vitals  reviewed. Constitutional: She is oriented to person, place, and time. She appears well-developed and well-nourished. No distress.  HENT:  Head: Normocephalic and atraumatic.  Right Ear: Hearing and ear canal normal.  Left Ear: Hearing and ear canal normal.  Nose: Nose normal.  Mouth/Throat: Uvula is midline, oropharynx is clear and moist and mucous membranes are normal. No oropharyngeal exudate, posterior oropharyngeal edema, posterior oropharyngeal erythema or tonsillar abscesses.  HEENT WNL  Eyes: Conjunctivae and EOM are normal. Pupils are equal, round, and reactive to light.  Neck: Normal range of motion. Neck supple.  Cardiovascular: Normal rate, regular rhythm and normal heart sounds.   Pulmonary/Chest: Effort normal and breath sounds normal. No respiratory distress. She has no wheezes.  Musculoskeletal: Normal range of motion.  Neurological: She is alert and oriented to person, place, and time.  Skin: Skin is warm and dry. She is not diaphoretic.  Tiny boil of right axilla; mildly tender without central fluctuance or drainage; no erythema, induration, or cellulitic change  Psychiatric: She has a normal mood and affect.    ED Course  Procedures (including critical care time)  DIAGNOSTIC STUDIES: Oxygen Saturation is 100% on room air, normal by my interpretation.    COORDINATION OF CARE: 9:35 AM-Discussed my suspicion of a viral infection. Recommended rest and using salt water or chloraseptic to help with sore throat symptoms. Warm compresses for boil.  Treatment plan discussed with patient and patient agrees.  Labs Review Labs Reviewed - No data to display  MDM   Final diagnoses:  Sore throat  Otalgia  Boil, axilla   Pt with non-infectious exam, likely viral URI.  Boil is tiny, without central fluctuance or drainage-- I&D deferred at this time, no not feel abx indicated.  Advised may use OTC chloraseptic spray or salt water gargles for throat, warm compresses for boil.   Rest and drink fluids.  Discussed plan with pt, they agreed.  Return precautions advised.  I personally performed the services described in this documentation, which was scribed in my presence. The recorded information has been reviewed and is accurate.  Larene Pickett, PA-C 07/21/13 [phone]

## 2013-07-21 NOTE — ED Notes (Signed)
C/o sore throat and ear pain X2d, no vomiting, no meds this AM, A/OX 4, ambulatory and in NAD

## 2013-07-21 NOTE — ED Notes (Signed)
C/O sore throat and bilateral ear aches x 2 days. Also c/o "boil" in her right axilla. States she gets " it every time I get sick".

## 2013-07-21 NOTE — Discharge Instructions (Signed)
May use over the counter chloraseptic spray and/or salt water gargles to help with sore throat. Follow up with your primary care physician if problems occur. Return to the ED for new concerns.

## 2013-07-21 NOTE — ED Notes (Signed)
Pt in peds with her child

## 2013-07-21 NOTE — ED Notes (Signed)
Patient in PEDS with child.

## 2013-07-24 NOTE — ED Provider Notes (Signed)
Medical screening examination/treatment/procedure(s) were performed by non-physician practitioner and as supervising physician I was immediately available for consultation/collaboration.   EKG Interpretation None        Ephraim Hamburger, MD 07/24/13 [phone]

## 2013-07-27 ENCOUNTER — Inpatient Hospital Stay (HOSPITAL_COMMUNITY)
Admission: AD | Admit: 2013-07-27 | Discharge: 2013-07-27 | Disposition: A | Discharge status: Home / Self Care | Payer: Medicaid Other | Source: Ambulatory Visit | Attending: Obstetrics & Gynecology | Admitting: Obstetrics & Gynecology

## 2013-07-27 ENCOUNTER — Encounter (HOSPITAL_COMMUNITY): Payer: Self-pay

## 2013-07-27 DIAGNOSIS — F17200 Nicotine dependence, unspecified, uncomplicated: Secondary | ICD-10-CM | POA: Insufficient documentation

## 2013-07-27 DIAGNOSIS — J029 Acute pharyngitis, unspecified: Secondary | ICD-10-CM | POA: Insufficient documentation

## 2013-07-27 DIAGNOSIS — A6000 Herpesviral infection of urogenital system, unspecified: Secondary | ICD-10-CM | POA: Insufficient documentation

## 2013-07-27 DIAGNOSIS — J020 Streptococcal pharyngitis: Secondary | ICD-10-CM

## 2013-07-27 LAB — URINALYSIS, ROUTINE W REFLEX MICROSCOPIC
Bilirubin Urine: NEGATIVE
Glucose, UA: NEGATIVE mg/dL
Ketones, ur: NEGATIVE mg/dL
Nitrite: NEGATIVE
PROTEIN: NEGATIVE mg/dL
Specific Gravity, Urine: >1.03 — ABNORMAL HIGH (ref 1.005–1.030)
Urobilinogen, UA: 0.2 mg/dL (ref 0.0–1.0)
pH: 5.5 (ref 5.0–8.0)

## 2013-07-27 LAB — RAPID STREP SCREEN (MED CTR MEBANE ONLY): Streptococcus, Group A Screen (Direct): NEGATIVE

## 2013-07-27 LAB — URINE MICROSCOPIC-ADD ON

## 2013-07-27 LAB — POCT PREGNANCY, URINE: PREG TEST UR: NEGATIVE

## 2013-07-27 MED ORDER — VALACYCLOVIR HCL 1 G PO TABS: 1000.0000 mg | ORAL_TABLET | Freq: Every day | ORAL | Status: DC

## 2013-07-27 NOTE — Discharge Instructions (Signed)
Take appropriate dosing for HSV outbreak - Valtrex 500 mg by mouth twice a day for 3 days OR Valtrex 500 mg 2 pills at a time once a day for 5 days. Client has medication at home. Continue warm drinks, salt water gargles and OTC Chloraseptic for throat.  Continue to sip lots of fluids. Will call you if strep test is positive, but a viral sore throat can be very severe and antibiotics do not help it improve.  Antibiotics will help if it is strep but the test is not back yet. Establish with a doctor to help you will these types of problems.  [employer] is an option for care.

## 2013-07-27 NOTE — MAU Note (Signed)
Pt presents with complaints of a sore throat that was treated in the ER at [facility] and has not gotten any better. She states since she has taken the medication diflucan and keflex that she now has an outbreak of herpes and this is not her first outbreak so she knows that is what it is.

## 2013-07-27 NOTE — MAU Provider Note (Signed)
History     CSN: [id number]  Arrival date and time: 07/27/13 1121   First Provider Initiated Contact with Patient 07/27/13 1211      Chief Complaint  Patient presents with  . Vaginitis   HPI Robin Horn 28 y.o. Comes to MAU with sore throat since Tuesday and continues to feel worse.  Was seen at [facility] for sores under her arm and was given Keflex for those and Diflucan.  Reports her child has strep and she thinks she has strep.  States she has HSV outbreak but does not want a pelvic exam.  States she is taking Valtrex 500 mg once a day.   OB History   Grav Para Term Preterm Abortions TAB SAB Ect Mult Living   7 4 4  0 3 3 0 0 0 4      Past Medical History  Diagnosis Date  . Abnormal Pap smear of cervix   . Gonorrhea   . Chlamydia   . Herpes   . Pregnant state, incidental   . Migraines   . Depression   . Anxiety   . UTI (lower urinary tract infection)   . Preeclampsia   . Ovarian cyst     Past Surgical History  Procedure Laterality Date  . Colposcopy    . Dilation and curettage of uterus    . Tonsillectomy      Family History  Problem Relation Age of Onset  . Other Neg Hx   . Kidney disease Mother   . Cancer Paternal Grandmother     History  Substance Use Topics  . Smoking status: Current Every Day Smoker -- 0.25 packs/day    Types: Cigarettes  . Smokeless tobacco: Never Used  . Alcohol Use: Yes     Comment: social    Allergies: No Known Allergies  Prescriptions prior to admission  Medication Sig Dispense Refill  . ibuprofen (ADVIL,MOTRIN) 200 MG tablet Take 800 mg by mouth every 6 (six) hours as needed for moderate pain.      Marland Kitchen oxymetazoline (AFRIN) 0.05 % nasal spray Place 2 sprays into both nostrils 2 (two) times daily as needed for congestion.        Review of Systems  Constitutional: Negative for fever.  HENT: Positive for ear pain and sore throat.   Respiratory: Negative for stridor.   Gastrointestinal: Negative for abdominal pain.   Genitourinary:       Genital HSV outbreak   Physical Exam   Blood pressure 147/83, pulse 92, temperature 98.9 F (37.2 C), resp. rate 20, height 5\' 1"  (1.549 m), weight 167 lb (75.751 kg), last menstrual period 07/09/2013.  Physical Exam  Nursing note and vitals reviewed. Constitutional: She is oriented to person, place, and time. She appears well-developed and well-nourished. She appears distressed.  Tearful  HENT:  Head: Normocephalic.  Throat is barely pink throughout without cobblestoning.  Reports she does not have tonsils.  No swelling seen. No exudate. No red spots.   Neck  - nodes not palpated although client seems to have a very sore throat.  Is talking softly and is tearful.  Needed to use a tongue blade to see back of throat. Ears - no redness, no signs of infection  Eyes: EOM are normal.  Neck: Neck supple.  GI: Soft. There is no tenderness. There is no rebound and no guarding.  Musculoskeletal: Normal range of motion.  Neurological: She is alert and oriented to person, place, and time.  Skin: Skin is warm and  dry.  Psychiatric: She has a normal mood and affect.    MAU Course  Procedures Results for orders placed during the hospital encounter of 07/27/13 (from the past 24 hour(s))  URINALYSIS, ROUTINE W REFLEX MICROSCOPIC     Status: Abnormal   Collection Time    07/27/13 11:30 AM      Result Value Ref Range   Color, Urine YELLOW  YELLOW   APPearance CLEAR  CLEAR   Specific Gravity, Urine >1.030 (*) 1.005 - 1.030   pH 5.5  5.0 - 8.0   Glucose, UA NEGATIVE  NEGATIVE mg/dL   Hgb urine dipstick TRACE (*) NEGATIVE   Bilirubin Urine NEGATIVE  NEGATIVE   Ketones, ur NEGATIVE  NEGATIVE mg/dL   Protein, ur NEGATIVE  NEGATIVE mg/dL   Urobilinogen, UA 0.2  0.0 - 1.0 mg/dL   Nitrite NEGATIVE  NEGATIVE   Leukocytes, UA MODERATE (*) NEGATIVE  URINE MICROSCOPIC-ADD ON     Status: Abnormal   Collection Time    07/27/13 11:30 AM      Result Value Ref Range   Squamous  Epithelial / LPF MANY (*) RARE   WBC, UA 21-50  <3 WBC/hpf   RBC / HPF 0-2  <3 RBC/hpf   Bacteria, UA RARE  RARE   Urine-Other MUCOUS PRESENT    POCT PREGNANCY, URINE     Status: None   Collection Time    07/27/13 11:45 AM      Result Value Ref Range   Preg Test, Ur NEGATIVE  NEGATIVE    MDM Client is mostly concerned about her throat.   Rapid strep test pending.  Will call client if she needs medication.  Assessment and Plan  Current HSV outbreak Sore throat   Plan Take appropriate dosing for HSV outbreak - Valtrex 500 mg by mouth twice a day for 3 days OR Valtrex 500 mg 2 pills at a time once a day for 5 days. Client has medication at home. Continue warm drinks, salt water gargles and OTC Chloraseptic for throat.  Continue to sip lots of fluids. Will call you if strep test is positive, but a viral sore throat can be very severe and antibiotics do not help it improve.  Antibiotics will help if it is strep but the test is not back yet. Establish with a doctor to help you will these types of problems.  [employer] is an option for care.  Robin Horn 07/27/2013, 12:26 PM

## 2013-07-28 LAB — URINE CULTURE: SPECIAL REQUESTS: NORMAL

## 2013-07-29 ENCOUNTER — Other Ambulatory Visit: Payer: Self-pay | Admitting: Obstetrics and Gynecology

## 2013-07-29 ENCOUNTER — Emergency Department (HOSPITAL_COMMUNITY)
Admission: EM | Admit: 2013-07-29 | Discharge: 2013-07-29 | Disposition: A | Discharge status: Home / Self Care | Payer: Medicaid Other | Attending: Emergency Medicine | Admitting: Emergency Medicine

## 2013-07-29 ENCOUNTER — Encounter (HOSPITAL_COMMUNITY): Payer: Self-pay | Admitting: Emergency Medicine

## 2013-07-29 ENCOUNTER — Emergency Department (HOSPITAL_COMMUNITY): Payer: Medicaid Other

## 2013-07-29 DIAGNOSIS — Z8742 Personal history of other diseases of the female genital tract: Secondary | ICD-10-CM | POA: Insufficient documentation

## 2013-07-29 DIAGNOSIS — F17200 Nicotine dependence, unspecified, uncomplicated: Secondary | ICD-10-CM | POA: Insufficient documentation

## 2013-07-29 DIAGNOSIS — B370 Candidal stomatitis: Secondary | ICD-10-CM | POA: Insufficient documentation

## 2013-07-29 DIAGNOSIS — Z8659 Personal history of other mental and behavioral disorders: Secondary | ICD-10-CM | POA: Insufficient documentation

## 2013-07-29 DIAGNOSIS — Z87440 Personal history of urinary (tract) infections: Secondary | ICD-10-CM | POA: Insufficient documentation

## 2013-07-29 DIAGNOSIS — J029 Acute pharyngitis, unspecified: Secondary | ICD-10-CM | POA: Insufficient documentation

## 2013-07-29 DIAGNOSIS — Z79899 Other long term (current) drug therapy: Secondary | ICD-10-CM | POA: Insufficient documentation

## 2013-07-29 DIAGNOSIS — Z8679 Personal history of other diseases of the circulatory system: Secondary | ICD-10-CM | POA: Insufficient documentation

## 2013-07-29 LAB — BASIC METABOLIC PANEL
BUN: 8 mg/dL (ref 6–23)
CHLORIDE: 101 meq/L (ref 96–112)
CO2: 23 mEq/L (ref 19–32)
Calcium: 9.7 mg/dL (ref 8.4–10.5)
Creatinine, Ser: 1.04 mg/dL (ref 0.50–1.10)
GFR calc non Af Amer: 73 mL/min — ABNORMAL LOW (ref >90)
GFR, EST AFRICAN AMERICAN: 85 mL/min — AB (ref >90)
GLUCOSE: 92 mg/dL (ref 70–99)
POTASSIUM: 4 meq/L (ref 3.7–5.3)
Sodium: 137 mEq/L (ref 137–147)

## 2013-07-29 LAB — CBC WITH DIFFERENTIAL/PLATELET
Basophils Absolute: 0 10*3/uL (ref 0.0–0.1)
Basophils Relative: 0 % (ref 0–1)
Eosinophils Absolute: 0.2 10*3/uL (ref 0.0–0.7)
Eosinophils Relative: 2 % (ref 0–5)
HCT: 36.6 % (ref 36.0–46.0)
HEMOGLOBIN: 12.3 g/dL (ref 12.0–15.0)
LYMPHS ABS: 2.2 10*3/uL (ref 0.7–4.0)
Lymphocytes Relative: 19 % (ref 12–46)
MCH: 28.5 pg (ref 26.0–34.0)
MCHC: 33.6 g/dL (ref 30.0–36.0)
MCV: 84.9 fL (ref 78.0–100.0)
Monocytes Absolute: 0.8 10*3/uL (ref 0.1–1.0)
Monocytes Relative: 7 % (ref 3–12)
NEUTROS ABS: 8.5 10*3/uL — AB (ref 1.7–7.7)
NEUTROS PCT: 73 % (ref 43–77)
Platelets: 260 10*3/uL (ref 150–400)
RBC: 4.31 MIL/uL (ref 3.87–5.11)
RDW: 13.6 % (ref 11.5–15.5)
WBC: 11.7 10*3/uL — AB (ref 4.0–10.5)

## 2013-07-29 LAB — CULTURE, GROUP A STREP

## 2013-07-29 MED ORDER — FLUCONAZOLE 100 MG PO TABS: 100.0000 mg | ORAL_TABLET | Freq: Every day | ORAL | Status: DC

## 2013-07-29 MED ORDER — IBUPROFEN 100 MG/5ML PO SUSP: 400.0000 mg | ORAL | Status: DC | PRN

## 2013-07-29 MED ORDER — NYSTATIN 100000 UNIT/ML MT SUSP: 500000.0000 [IU] | Freq: Four times a day (QID) | OROMUCOSAL | Status: DC

## 2013-07-29 MED ORDER — DEXAMETHASONE SODIUM PHOSPHATE 10 MG/ML IJ SOLN
10.0000 mg | Freq: Once | INTRAMUSCULAR | Status: AC
  Administered 2013-07-29: 10 mg via INTRAVENOUS
  Filled 2013-07-29: qty 1

## 2013-07-29 MED ORDER — PENICILLIN G BENZATHINE 1200000 UNIT/2ML IM SUSP
1.2000 10*6.[IU] | Freq: Once | INTRAMUSCULAR | Status: AC
  Administered 2013-07-29: 1.2 10*6.[IU] via INTRAMUSCULAR
  Filled 2013-07-29: qty 2

## 2013-07-29 MED ORDER — HYDROCODONE-ACETAMINOPHEN 7.5-325 MG/15ML PO SOLN: 10.0000 mL | Freq: Four times a day (QID) | ORAL | Status: DC | PRN

## 2013-07-29 MED ORDER — KETOROLAC TROMETHAMINE 30 MG/ML IJ SOLN
30.0000 mg | Freq: Once | INTRAMUSCULAR | Status: AC
  Administered 2013-07-29: 30 mg via INTRAVENOUS
  Filled 2013-07-29: qty 1

## 2013-07-29 MED ORDER — IOHEXOL 300 MG/ML  SOLN
80.0000 mL | Freq: Once | INTRAMUSCULAR | Status: AC | PRN
  Administered 2013-07-29: 80 mL via INTRAVENOUS

## 2013-07-29 NOTE — ED Provider Notes (Signed)
CSN: [id number]     Arrival date & time 07/29/13  1245 History   This chart was scribed for non-physician practitioner Carlisle Cater, PA-C, working with Mervin Kung, MD, by Neta Ehlers, ED Scribe. This patient was seen in room WTR8/WTR8 and the patient's care was started at 2:11 PM. First MD Initiated Contact with Patient 07/29/13 1400     Chief Complaint  Patient presents with  . Oral Swelling  . Dental Pain    The history is provided by the patient and a relative. No language interpreter was used.   HPI Comments: Robin Horn is a 28 y.o. female, with a h/o genital herpes, who presents to the Emergency Department complaining of a sore throat which has persisted for week and has been associated with lingual swelling  and dental pain. The pt reports the sore throat as the first symptom with her tongue swelling in the past 36 hours. She also reports pain with movement of her neck, though she is able to touch her chin to her chest. In the ED her temperature is 99.6 F. She has had difficulty eating due to the pain and swelling; she rates the pain as 8/10. She has used IBU without relief. She was treated at [facility] yesterday; a strep test was negative.  She has had sick contacts. The pt had a tonsillectomy several months ago. Onset: acute. Course: worsening. Aggravating factors: none. Alleviating factors: none.   Past Medical History  Diagnosis Date  . Abnormal Pap smear of cervix   . Gonorrhea   . Chlamydia   . Herpes   . Pregnant state, incidental   . Migraines   . Depression   . Anxiety   . UTI (lower urinary tract infection)   . Preeclampsia   . Ovarian cyst    Past Surgical History  Procedure Laterality Date  . Colposcopy    . Dilation and curettage of uterus    . Tonsillectomy     Family History  Problem Relation Age of Onset  . Other Neg Hx   . Kidney disease Mother   . Cancer Paternal Grandmother    History  Substance Use Topics  . Smoking status:  Current Every Day Smoker -- 0.25 packs/day    Types: Cigarettes  . Smokeless tobacco: Never Used  . Alcohol Use: Yes     Comment: social   OB History   Grav Para Term Preterm Abortions TAB SAB Ect Mult Living   7 4 4  0 3 3 0 0 0 4     Review of Systems  Constitutional: Negative for fever (99.6 in the ED).  HENT: Positive for dental problem, facial swelling and sore throat. Negative for rhinorrhea.   Eyes: Negative for redness.  Respiratory: Negative for cough.   Cardiovascular: Negative for chest pain.  Gastrointestinal: Negative for nausea, vomiting, abdominal pain and diarrhea.  Genitourinary: Negative for dysuria.  Musculoskeletal: Negative for myalgias and neck pain.  Skin: Negative for rash.  Neurological: Negative for headaches.    Allergies  Review of patient's allergies indicates no known allergies.  Home Medications   Current Outpatient Rx  Name  Route  Sig  Dispense  Refill  . ibuprofen (ADVIL,MOTRIN) 200 MG tablet   Oral   Take 800 mg by mouth every 6 (six) hours as needed for moderate pain.         Marland Kitchen oxymetazoline (AFRIN) 0.05 % nasal spray   Each Nare   Place 2 sprays into both nostrils  2 (two) times daily as needed for congestion.         . valACYclovir (VALTREX) 1000 MG tablet   Oral   Take 1 tablet (1,000 mg total) by mouth daily.   5 tablet   0    Triage Vitals: BP 148/95  Pulse 95  Temp(Src) 99.6 F (37.6 C) (Oral)  Resp 16  SpO2 100%  LMP 07/09/2013  Physical Exam  Nursing note and vitals reviewed. Constitutional: She appears well-developed and well-nourished. No distress.  HENT:  Head: Atraumatic. Macrocephalic.  Right Ear: Tympanic membrane, external ear and ear canal normal.  Left Ear: Tympanic membrane, external ear and ear canal normal.  Nose: Rhinorrhea present. No mucosal edema. Right sinus exhibits no maxillary sinus tenderness and no frontal sinus tenderness. Left sinus exhibits no maxillary sinus tenderness and no frontal  sinus tenderness.  Mouth/Throat: Mucous membranes are not dry. Oral lesions (small area of erythema without necrosis of L soft palate ) present. There is trismus in the jaw. No uvula swelling or lacerations. Posterior oropharyngeal erythema (very mild) present. No oropharyngeal exudate, posterior oropharyngeal edema or tonsillar abscesses.  No peritonsillar abscess noted. Whitish coating of tongue suspicious for thrush. No gross edema of tongue on my exam. + muffled voice/ + trismus.   Eyes: EOM are normal. Pupils are equal, round, and reactive to light.  Neck: Normal range of motion. Neck supple. No tracheal tenderness present. No tracheal deviation present.  Generalized tenderness of anterior neck.   Cardiovascular: Normal rate.   No murmur heard. Pulmonary/Chest: Effort normal and breath sounds normal. No stridor. No respiratory distress. She has no wheezes.  Musculoskeletal: Normal range of motion.  Lymphadenopathy:    She has no cervical adenopathy.  Neurological: She is alert.  Skin: Skin is warm and dry.  Psychiatric: She has a normal mood and affect. Her behavior is normal.    ED Course  Procedures (including critical care time)  DIAGNOSTIC STUDIES: Oxygen Saturation is 100% on room air, normal by my interpretation.    COORDINATION OF CARE:  2:17 PM- Discussed treatment plan with patient, and the patient agreed to the plan. The plan includes medication and imaging.   Labs Review Labs Reviewed  CBC WITH DIFFERENTIAL - Abnormal; Notable for the following:    WBC 11.7 (*)    Neutro Abs 8.5 (*)    All other components within normal limits  BASIC METABOLIC PANEL - Abnormal; Notable for the following:    GFR calc non Af Amer 73 (*)    GFR calc Af Amer 85 (*)    All other components within normal limits   Imaging Review Ct Soft Tissue Neck W Contrast  07/29/2013   CLINICAL DATA:  Neck pain. Elevated white blood count. Throat and neck pain. Trismus.  EXAM: CT NECK WITH  CONTRAST  TECHNIQUE: Multidetector CT imaging of the neck was performed using the standard protocol following the bolus administration of intravenous contrast.  CONTRAST:  94mL OMNIPAQUE IOHEXOL 300 MG/ML  SOLN  COMPARISON:  12/13/2010  FINDINGS: There is no adenopathy, abscess, mass lesion, or other abnormality. Prevertebral soft tissues are normal. Tonsils are normal. Parotid glands and submandibular glands are normal. Thyroid gland is normal. Lung apices are normal. No osseous abnormality. The paranasal sinuses are clear except for a tiny retention cyst in the base of the left maxillary sinus, stable. No periodontal disease.  IMPRESSION: Normal CT scan of the neck.   Electronically Signed   By: Vanita Ingles.D.  On: 07/29/2013 16:04     EKG Interpretation None      Vital signs reviewed and are as follows: Filed Vitals:   07/29/13 1641  BP: 131/85  Pulse: 87  Temp:   Resp: 18   Patient has abnormal exam. She has thrush on tongue. She has mild erythema of throat without exudates, no signs of peritonsillar abscess --  but significant pain, muffled voice and trismus. Feel CT is needed to r/o deep space infection in neck given these signs. Will give toradol and decadron for pain and swelling.   CT neg. Reviewed by myself. Patient informed.   Treated empirically for strep given significant symptoms.   Will treat thrust with nystatin rinse. Also, given possibility of esophageal candidiasis causing this throat pain, started on diflucan x 21 days.   Strongly encouraged f/u with PCP. Patient verbalizes understanding and agrees with plan.   Return precautions discussed with patient including return with worsening pain, fever, trouble swallowing or breathing, changing symptoms. Patient verbalizes understanding and agrees with plan.   Patient counseled on use of narcotic pain medications. Counseled not to combine these medications with others containing tylenol. Urged not to drink alcohol, drive,  or perform any other activities that requires focus while taking these medications. The patient verbalizes understanding and agrees with the plan.   MDM   Final diagnoses:  Sore throat  Thrush   Sore throat -- discussion per above. Do not suspect invasive fungal infection given palate findings/CT findings. No airway compromise. No other systemic symptoms of illness. Patient appears non-toxic. Treated for strep. Treated for candidal esophagitis given thrush + severe throat pain. No h/o HIV. Patient will need PCP f/u.   I personally performed the services described in this documentation, which was scribed in my presence. The recorded information has been reviewed and is accurate.   Carlisle Cater, PA-C 07/29/13 2046

## 2013-07-29 NOTE — ED Notes (Addendum)
Pt states she has had dental pain, tongue swelling, and sore throat x 1 wk.  [facility].  States that her tongue is "twice as big as it should be".  Moderate swelling noted w/ brown/white film on tongue surface.  Has had tonsils removed.  Airway clear.  Breathing normally.

## 2013-07-29 NOTE — Discharge Instructions (Signed)
Please read and follow all provided instructions.  Your diagnoses today include:  1. Sore throat   2. Thrush     Tests performed today include:  Vital signs. See below for your results today.   Medications prescribed:   Ibuprofen (Motrin, Advil) - anti-inflammatory pain medication  Do not exceed 600mg  ibuprofen every 6 hours, take with food  You have been prescribed an anti-inflammatory medication or NSAID. Take with food. Take smallest effective dose for the shortest duration needed for your pain. Stop taking if you experience stomach pain or vomiting.    Vicodin (hydrocodone/acetaminophen) - narcotic pain medication  DO NOT drive or perform any activities that require you to be awake and alert because this medicine can make you drowsy. BE VERY CAREFUL not to take multiple medicines containing Tylenol (also called acetaminophen). Doing so can lead to an overdose which can damage your liver and cause liver failure and possibly death.   Fluconazole - mediation for thrush in the throat  Home care instructions:  Please read the educational materials provided and follow any instructions contained in this packet.  Follow-up instructions: Please follow-up with your primary care provider as needed for further evaluation of your symptoms.  If you do not have a primary care doctor -- see below for referral information.   Return instructions:   Please return to the Emergency Department if you experience worsening symptoms.   Return if you have worsening problems swallowing, your neck becomes swollen, you cannot swallow your saliva or your voice becomes muffled.   Return with high persistent fever, persistent vomiting, or if you have trouble breathing.   Please return if you have any other emergent concerns.  Additional Information:  Your vital signs today were: BP 148/95   Pulse 95   Temp(Src) 99.6 F (37.6 C) (Oral)   Resp 16   SpO2 100%   LMP 07/09/2013 If your blood  pressure (BP) was elevated above 135/85 this visit, please have this repeated by your doctor within one month. --------------

## 2013-07-30 NOTE — ED Provider Notes (Signed)
Medical screening examination/treatment/procedure(s) were performed by non-physician practitioner and as supervising physician I was immediately available for consultation/collaboration.   EKG Interpretation None        Mervin Kung, MD 07/30/13 [phone]

## 2013-08-13 ENCOUNTER — Encounter: Payer: Medicaid Other | Admitting: Obstetrics & Gynecology

## 2013-08-13 ENCOUNTER — Telehealth: Payer: Self-pay

## 2013-08-13 NOTE — Telephone Encounter (Signed)
Pt. Missed today's appointment with Dr. Ihor Dow. Non-urgent issue. No need to re-schedule at this time. Attempted to call pt. No answer. Left message stating we are sorry you missed your appointment, please call clinic to re-schedule.

## 2013-09-30 ENCOUNTER — Emergency Department (HOSPITAL_COMMUNITY): Payer: Medicaid Other

## 2013-09-30 ENCOUNTER — Encounter (HOSPITAL_COMMUNITY): Payer: Self-pay | Admitting: Emergency Medicine

## 2013-09-30 ENCOUNTER — Emergency Department (HOSPITAL_COMMUNITY)
Admission: EM | Admit: 2013-09-30 | Discharge: 2013-09-30 | Disposition: A | Discharge status: Home / Self Care | Payer: Medicaid Other | Attending: Emergency Medicine | Admitting: Emergency Medicine

## 2013-09-30 DIAGNOSIS — Z87440 Personal history of urinary (tract) infections: Secondary | ICD-10-CM | POA: Insufficient documentation

## 2013-09-30 DIAGNOSIS — R1110 Vomiting, unspecified: Secondary | ICD-10-CM | POA: Insufficient documentation

## 2013-09-30 DIAGNOSIS — Z8742 Personal history of other diseases of the female genital tract: Secondary | ICD-10-CM | POA: Insufficient documentation

## 2013-09-30 DIAGNOSIS — Z8619 Personal history of other infectious and parasitic diseases: Secondary | ICD-10-CM | POA: Insufficient documentation

## 2013-09-30 DIAGNOSIS — Z3202 Encounter for pregnancy test, result negative: Secondary | ICD-10-CM | POA: Insufficient documentation

## 2013-09-30 DIAGNOSIS — Z9889 Other specified postprocedural states: Secondary | ICD-10-CM | POA: Insufficient documentation

## 2013-09-30 DIAGNOSIS — F17200 Nicotine dependence, unspecified, uncomplicated: Secondary | ICD-10-CM | POA: Insufficient documentation

## 2013-09-30 DIAGNOSIS — K219 Gastro-esophageal reflux disease without esophagitis: Secondary | ICD-10-CM | POA: Insufficient documentation

## 2013-09-30 DIAGNOSIS — Z8659 Personal history of other mental and behavioral disorders: Secondary | ICD-10-CM | POA: Insufficient documentation

## 2013-09-30 DIAGNOSIS — Z8679 Personal history of other diseases of the circulatory system: Secondary | ICD-10-CM | POA: Insufficient documentation

## 2013-09-30 DIAGNOSIS — Z79899 Other long term (current) drug therapy: Secondary | ICD-10-CM | POA: Insufficient documentation

## 2013-09-30 LAB — URINALYSIS, ROUTINE W REFLEX MICROSCOPIC
Bilirubin Urine: NEGATIVE
Glucose, UA: NEGATIVE mg/dL
Ketones, ur: NEGATIVE mg/dL
NITRITE: NEGATIVE
PROTEIN: NEGATIVE mg/dL
Specific Gravity, Urine: 1.023 (ref 1.005–1.030)
Urobilinogen, UA: 0.2 mg/dL (ref 0.0–1.0)
pH: 5 (ref 5.0–8.0)

## 2013-09-30 LAB — CBC WITH DIFFERENTIAL/PLATELET
BASOS ABS: 0 10*3/uL (ref 0.0–0.1)
Basophils Relative: 0 % (ref 0–1)
EOS PCT: 4 % (ref 0–5)
Eosinophils Absolute: 0.3 10*3/uL (ref 0.0–0.7)
HCT: 35.2 % — ABNORMAL LOW (ref 36.0–46.0)
Hemoglobin: 11.6 g/dL — ABNORMAL LOW (ref 12.0–15.0)
Lymphocytes Relative: 39 % (ref 12–46)
Lymphs Abs: 2.6 10*3/uL (ref 0.7–4.0)
MCH: 29 pg (ref 26.0–34.0)
MCHC: 33 g/dL (ref 30.0–36.0)
MCV: 88 fL (ref 78.0–100.0)
Monocytes Absolute: 0.4 10*3/uL (ref 0.1–1.0)
Monocytes Relative: 6 % (ref 3–12)
Neutro Abs: 3.4 10*3/uL (ref 1.7–7.7)
Neutrophils Relative %: 51 % (ref 43–77)
Platelets: 255 10*3/uL (ref 150–400)
RBC: 4 MIL/uL (ref 3.87–5.11)
RDW: 14.7 % (ref 11.5–15.5)
WBC: 6.7 10*3/uL (ref 4.0–10.5)

## 2013-09-30 LAB — COMPREHENSIVE METABOLIC PANEL
ALT: 17 U/L (ref 0–35)
AST: 18 U/L (ref 0–37)
Albumin: 3.8 g/dL (ref 3.5–5.2)
Alkaline Phosphatase: 63 U/L (ref 39–117)
BUN: 10 mg/dL (ref 6–23)
CO2: 22 meq/L (ref 19–32)
CREATININE: 0.98 mg/dL (ref 0.50–1.10)
Calcium: 9.3 mg/dL (ref 8.4–10.5)
Chloride: 104 mEq/L (ref 96–112)
GFR calc Af Amer: >90 mL/min (ref >90)
GFR, EST NON AFRICAN AMERICAN: 79 mL/min — AB (ref >90)
Glucose, Bld: 102 mg/dL — ABNORMAL HIGH (ref 70–99)
Potassium: 3.8 mEq/L (ref 3.7–5.3)
Sodium: 140 mEq/L (ref 137–147)
Total Bilirubin: <0.2 mg/dL — ABNORMAL LOW (ref 0.3–1.2)
Total Protein: 7.2 g/dL (ref 6.0–8.3)

## 2013-09-30 LAB — URINE MICROSCOPIC-ADD ON

## 2013-09-30 LAB — LIPASE, BLOOD: LIPASE: 31 U/L (ref 11–59)

## 2013-09-30 LAB — POC URINE PREG, ED: Preg Test, Ur: NEGATIVE

## 2013-09-30 MED ORDER — SUCRALFATE 1 G PO TABS: 1.0000 g | ORAL_TABLET | Freq: Four times a day (QID) | ORAL | Status: DC

## 2013-09-30 MED ORDER — ONDANSETRON HCL 4 MG/2ML IJ SOLN
4.0000 mg | Freq: Once | INTRAMUSCULAR | Status: AC
  Administered 2013-09-30: 4 mg via INTRAVENOUS
  Filled 2013-09-30: qty 2

## 2013-09-30 MED ORDER — MORPHINE SULFATE 4 MG/ML IJ SOLN
4.0000 mg | Freq: Once | INTRAMUSCULAR | Status: AC
  Administered 2013-09-30: 4 mg via INTRAVENOUS
  Filled 2013-09-30: qty 1

## 2013-09-30 MED ORDER — SODIUM CHLORIDE 0.9 % IV SOLN
INTRAVENOUS | Status: DC
  Administered 2013-09-30: 19:00:00 via INTRAVENOUS

## 2013-09-30 MED ORDER — SUCRALFATE 1 G PO TABS
1.0000 g | ORAL_TABLET | Freq: Three times a day (TID) | ORAL | Status: DC
Start: 2013-09-30 — End: 2013-10-01
  Administered 2013-09-30: 1 g via ORAL
  Filled 2013-09-30 (×2): qty 1

## 2013-09-30 MED ORDER — FAMOTIDINE 20 MG PO TABS: 20.0000 mg | ORAL_TABLET | Freq: Two times a day (BID) | ORAL | Status: DC

## 2013-09-30 MED ORDER — GI COCKTAIL ~~LOC~~
30.0000 mL | Freq: Once | ORAL | Status: AC
  Administered 2013-09-30: 30 mL via ORAL
  Filled 2013-09-30: qty 30

## 2013-09-30 MED ORDER — FAMOTIDINE 20 MG PO TABS
40.0000 mg | ORAL_TABLET | Freq: Once | ORAL | Status: AC
  Administered 2013-09-30: 40 mg via ORAL
  Filled 2013-09-30: qty 2

## 2013-09-30 NOTE — ED Notes (Signed)
Per pt sts she ate some sausage, eggs, and other things this am and since her abdomen has been hurting and vomiting. sts hx of gallbladder problems.

## 2013-09-30 NOTE — Discharge Instructions (Signed)
Gastroesophageal Reflux Disease, Adult Gastroesophageal reflux disease (GERD) happens when acid from your stomach flows up into the esophagus. When acid comes in contact with the esophagus, the acid causes soreness (inflammation) in the esophagus. Over time, GERD may create small holes (ulcers) in the lining of the esophagus. CAUSES   Increased body weight. This puts pressure on the stomach, making acid rise from the stomach into the esophagus.  Smoking. This increases acid production in the stomach.  Drinking alcohol. This causes decreased pressure in the lower esophageal sphincter (valve or ring of muscle between the esophagus and stomach), allowing acid from the stomach into the esophagus.  Late evening meals and a full stomach. This increases pressure and acid production in the stomach.  A malformed lower esophageal sphincter. Sometimes, no cause is found. SYMPTOMS   Burning pain in the lower part of the mid-chest behind the breastbone and in the mid-stomach area. This may occur twice a week or more often.  Trouble swallowing.  Sore throat.  Dry cough.  Asthma-like symptoms including chest tightness, shortness of breath, or wheezing. DIAGNOSIS  Your caregiver may be able to diagnose GERD based on your symptoms. In some cases, X-rays and other tests may be done to check for complications or to check the condition of your stomach and esophagus. TREATMENT  Your caregiver may recommend over-the-counter or prescription medicines to help decrease acid production. Ask your caregiver before starting or adding any new medicines.  HOME CARE INSTRUCTIONS   Change the factors that you can control. Ask your caregiver for guidance concerning weight loss, quitting smoking, and alcohol consumption.  Avoid foods and drinks that make your symptoms worse, such as:  Caffeine or alcoholic drinks.  Chocolate.  Peppermint or mint flavorings.  Garlic and onions.  Spicy foods.  Citrus fruits,  such as oranges, lemons, or limes.  Tomato-based foods such as sauce, chili, salsa, and pizza.  Fried and fatty foods.  Avoid lying down for the 3 hours prior to your bedtime or prior to taking a nap.  Eat small, frequent meals instead of large meals.  Wear loose-fitting clothing. Do not wear anything tight around your waist that causes pressure on your stomach.  Raise the head of your bed 6 to 8 inches with wood blocks to help you sleep. Extra pillows will not help.  Only take over-the-counter or prescription medicines for pain, discomfort, or fever as directed by your caregiver.  Do not take aspirin, ibuprofen, or other nonsteroidal anti-inflammatory drugs (NSAIDs). SEEK IMMEDIATE MEDICAL CARE IF:   You have pain in your arms, neck, jaw, teeth, or back.  Your pain increases or changes in intensity or duration.  You develop nausea, vomiting, or sweating (diaphoresis).  You develop shortness of breath, or you faint.  Your vomit is green, yellow, black, or looks like coffee grounds or blood.  Your stool is red, bloody, or black. These symptoms could be signs of other problems, such as heart disease, gastric bleeding, or esophageal bleeding. MAKE SURE YOU:   Understand these instructions.  Will watch your condition.  Will get help right away if you are not doing well or get worse. Document Released: 01/25/2005 Document Revised: 07/10/2011 Document Reviewed: 11/04/2010 [facility] Patient Information 2014 [place], [place]. Diet for Gastroesophageal Reflux Disease, Adult Reflux (acid reflux) is when acid from your stomach flows up into the esophagus. When acid comes in contact with the esophagus, the acid causes irritation and soreness (inflammation) in the esophagus. When reflux happens often or so severely  Information ©2014 [employer].  Diet for Gastroesophageal Reflux Disease, Adult  Reflux (acid reflux) is when acid from your stomach flows up into the esophagus. When acid comes in contact with the esophagus, the acid causes irritation and soreness (inflammation) in the esophagus. When reflux happens often or so severely that it causes damage to the esophagus, it is called gastroesophageal reflux disease (GERD). Nutrition therapy can help ease the discomfort of GERD.  FOODS OR DRINKS TO AVOID OR  LIMIT  · Smoking or chewing tobacco. Nicotine is one of the most potent stimulants to acid production in the gastrointestinal tract.  · Caffeinated and decaffeinated coffee and black tea.  · Regular or low-calorie carbonated beverages or energy drinks (caffeine-free carbonated beverages are allowed).    · Strong spices, such as black pepper, white pepper, red pepper, cayenne, curry powder, and chili powder.  · Peppermint or spearmint.  · Chocolate.  · High-fat foods, including meats and fried foods. Extra added fats including oils, butter, salad dressings, and nuts. Limit these to less than 8 tsp per day.  · Fruits and vegetables if they are not tolerated, such as citrus fruits or tomatoes.  · Alcohol.  · Any food that seems to aggravate your condition.  If you have questions regarding your diet, call your caregiver or a registered dietitian.  OTHER THINGS THAT MAY HELP GERD INCLUDE:   · Eating your meals slowly, in a relaxed setting.  · Eating 5 to 6 small meals per day instead of 3 large meals.  · Eliminating food for a period of time if it causes distress.  · Not lying down until 3 hours after eating a meal.  · Keeping the head of your bed raised 6 to 9 inches (15 to 23 cm) by using a foam wedge or blocks under the legs of the bed. Lying flat may make symptoms worse.  · Being physically active. Weight loss may be helpful in reducing reflux in overweight or obese adults.  · Wear loose fitting clothing  EXAMPLE MEAL PLAN  This meal plan is approximately 2,000 calories based on [employer]My[employer].gov meal planning guidelines.  Breakfast  · ½ cup cooked oatmeal.  · 1 cup strawberries.  · 1 cup low-fat milk.  · 1 oz almonds.  Snack  · 1 cup cucumber slices.  · 6 oz yogurt (made from low-fat or fat-free milk).  Lunch  · 2 slice whole-wheat bread.  · 2½ oz sliced turkey.  · 2 tsp mayonnaise.  · 1 cup blueberries.  · 1 cup snap peas.  Snack  · 6 whole-wheat crackers.  · 1 oz string cheese.  Dinner  · ½ cup brown rice.  · 1  cup mixed veggies.  · 1 tsp olive oil.  · 3 oz grilled fish.  Document Released: 04/17/2005 Document Revised: 07/10/2011 Document Reviewed: 03/03/2011  Exit[employer]® Patient Information ©2014 [employer].

## 2013-09-30 NOTE — ED Provider Notes (Signed)
CSN: [id number]     Arrival date & time 09/30/13  1702 History   First MD Initiated Contact with Patient 09/30/13 1816     Chief Complaint  Patient presents with  . Abdominal Pain  . Emesis     (Consider location/radiation/quality/duration/timing/severity/associated sxs/prior Treatment) Patient is a 28 y.o. female presenting with abdominal pain and vomiting. The history is provided by the patient.  Abdominal Pain Associated symptoms: vomiting   Emesis Associated symptoms: abdominal pain    patient here complaining of right upper quadrant abdominal pain that began after she had a fatty meal this morning. History of similar symptoms in the past and was told that she may have gallbladder disease. She's had nausea no vomiting. No fever or chills. No black or bloody stools. Symptoms persisted and characterized as colicky. No treatment used prior to arrival. Nothing makes her symptoms better. Denies any urinary symptoms.  Past Medical History  Diagnosis Date  . Abnormal Pap smear of cervix   . Gonorrhea   . Chlamydia   . Herpes   . Pregnant state, incidental   . Migraines   . Depression   . Anxiety   . UTI (lower urinary tract infection)   . Preeclampsia   . Ovarian cyst    Past Surgical History  Procedure Laterality Date  . Colposcopy    . Dilation and curettage of uterus    . Tonsillectomy     Family History  Problem Relation Age of Onset  . Other Neg Hx   . Kidney disease Mother   . Cancer Paternal Grandmother    History  Substance Use Topics  . Smoking status: Current Every Day Smoker -- 0.25 packs/day    Types: Cigarettes  . Smokeless tobacco: Never Used  . Alcohol Use: Yes     Comment: social   OB History   Grav Para Term Preterm Abortions TAB SAB Ect Mult Living   7 4 4  0 3 3 0 0 0 4     Review of Systems  Gastrointestinal: Positive for vomiting and abdominal pain.  All other systems reviewed and are negative.     Allergies  Review of patient's  allergies indicates no known allergies.  Home Medications   Prior to Admission medications   Medication Sig Start Date End Date Taking? Authorizing Provider  fluconazole (DIFLUCAN) 100 MG tablet Take 1 tablet (100 mg total) by mouth daily. 07/29/13   Carlisle Cater, PA-C  HYDROcodone-acetaminophen (HYCET) 7.5-325 mg/15 ml solution Take 10 mLs by mouth 4 (four) times daily as needed for moderate pain. 07/29/13 07/29/14  Carlisle Cater, PA-C  ibuprofen (ADVIL,MOTRIN) 200 MG tablet Take 800 mg by mouth every 6 (six) hours as needed for moderate pain.    Historical Provider, MD  ibuprofen (CHILDRENS IBUPROFEN) 100 MG/5ML suspension Take 20 mLs (400 mg total) by mouth every 4 (four) hours as needed. 07/29/13   Carlisle Cater, PA-C  nystatin (MYCOSTATIN) 100000 UNIT/ML suspension Take 5 mLs (500,000 Units total) by mouth 4 (four) times daily. 07/29/13   Carlisle Cater, PA-C  oxymetazoline (AFRIN) 0.05 % nasal spray Place 2 sprays into both nostrils 2 (two) times daily as needed for congestion.    Historical Provider, MD  phenol (CHLORASEPTIC) 1.4 % LIQD Use as directed 1 spray in the mouth or throat as needed for throat irritation / pain (8-10 times a day). Pours in mouth, swish and spits out    Historical Provider, MD  valACYclovir (VALTREX) 1000 MG tablet Take 1 tablet (1,000  mg total) by mouth daily. 07/27/13   Virginia Rochester, NP   BP 167/100  Pulse 75  Temp(Src) 98 F (36.7 C) (Oral)  Resp 18  SpO2 100%  LMP 09/29/2013 Physical Exam  Nursing note and vitals reviewed. Constitutional: She is oriented to person, place, and time. She appears well-developed and well-nourished.  Non-toxic appearance. No distress.  HENT:  Head: Normocephalic and atraumatic.  Eyes: Conjunctivae, EOM and lids are normal. Pupils are equal, round, and reactive to light.  Neck: Normal range of motion. Neck supple. No tracheal deviation present. No mass present.  Cardiovascular: Normal rate, regular rhythm and normal heart  sounds.  Exam reveals no gallop.   No murmur heard. Pulmonary/Chest: Effort normal and breath sounds normal. No stridor. No respiratory distress. She has no decreased breath sounds. She has no wheezes. She has no rhonchi. She has no rales.  Abdominal: Soft. Normal appearance and bowel sounds are normal. She exhibits no distension. There is tenderness in the right upper quadrant. There is no rigidity, no rebound, no guarding and no CVA tenderness.  Musculoskeletal: Normal range of motion. She exhibits no edema and no tenderness.  Neurological: She is alert and oriented to person, place, and time. She has normal strength. No cranial nerve deficit or sensory deficit. GCS eye subscore is 4. GCS verbal subscore is 5. GCS motor subscore is 6.  Skin: Skin is warm and dry. No abrasion and no rash noted.  Psychiatric: She has a normal mood and affect. Her speech is normal and behavior is normal.    ED Course  Procedures (including critical care time) Labs Review Labs Reviewed  CBC WITH DIFFERENTIAL - Abnormal; Notable for the following:    Hemoglobin 11.6 (*)    HCT 35.2 (*)    All other components within normal limits  COMPREHENSIVE METABOLIC PANEL - Abnormal; Notable for the following:    Glucose, Bld 102 (*)    Total Bilirubin <0.2 (*)    GFR calc non Af Amer 79 (*)    All other components within normal limits  LIPASE, BLOOD  URINALYSIS, ROUTINE W REFLEX MICROSCOPIC  POC URINE PREG, ED  POC URINE PREG, ED    Imaging Review No results found.   EKG Interpretation None      MDM   Final diagnoses:  None    Patient had negative ultrasound here. Blood work reassuring and patient likely has GERD. Will place on meds and she is stable for discharge    Leota Jacobsen, MD 09/30/13 2147

## 2013-12-02 ENCOUNTER — Encounter (HOSPITAL_COMMUNITY): Payer: Self-pay | Admitting: Emergency Medicine

## 2013-12-02 ENCOUNTER — Emergency Department (HOSPITAL_COMMUNITY)
Admission: EM | Admit: 2013-12-02 | Discharge: 2013-12-02 | Disposition: A | Discharge status: Home / Self Care | Payer: Medicaid Other | Attending: Emergency Medicine | Admitting: Emergency Medicine

## 2013-12-02 DIAGNOSIS — Z8619 Personal history of other infectious and parasitic diseases: Secondary | ICD-10-CM | POA: Diagnosis not present

## 2013-12-02 DIAGNOSIS — Z79899 Other long term (current) drug therapy: Secondary | ICD-10-CM | POA: Insufficient documentation

## 2013-12-02 DIAGNOSIS — Z3202 Encounter for pregnancy test, result negative: Secondary | ICD-10-CM | POA: Insufficient documentation

## 2013-12-02 DIAGNOSIS — Z87440 Personal history of urinary (tract) infections: Secondary | ICD-10-CM | POA: Diagnosis not present

## 2013-12-02 DIAGNOSIS — G43909 Migraine, unspecified, not intractable, without status migrainosus: Secondary | ICD-10-CM | POA: Insufficient documentation

## 2013-12-02 DIAGNOSIS — Z8742 Personal history of other diseases of the female genital tract: Secondary | ICD-10-CM | POA: Insufficient documentation

## 2013-12-02 DIAGNOSIS — F3289 Other specified depressive episodes: Secondary | ICD-10-CM | POA: Diagnosis not present

## 2013-12-02 DIAGNOSIS — F411 Generalized anxiety disorder: Secondary | ICD-10-CM | POA: Diagnosis not present

## 2013-12-02 DIAGNOSIS — F329 Major depressive disorder, single episode, unspecified: Secondary | ICD-10-CM | POA: Diagnosis not present

## 2013-12-02 DIAGNOSIS — F17200 Nicotine dependence, unspecified, uncomplicated: Secondary | ICD-10-CM | POA: Diagnosis not present

## 2013-12-02 DIAGNOSIS — R109 Unspecified abdominal pain: Secondary | ICD-10-CM | POA: Insufficient documentation

## 2013-12-02 LAB — CBC WITH DIFFERENTIAL/PLATELET
BASOS PCT: 0 % (ref 0–1)
Basophils Absolute: 0 10*3/uL (ref 0.0–0.1)
EOS ABS: 0.2 10*3/uL (ref 0.0–0.7)
EOS PCT: 3 % (ref 0–5)
HEMATOCRIT: 34.6 % — AB (ref 36.0–46.0)
HEMOGLOBIN: 11.1 g/dL — AB (ref 12.0–15.0)
LYMPHS ABS: 2.4 10*3/uL (ref 0.7–4.0)
Lymphocytes Relative: 35 % (ref 12–46)
MCH: 28.3 pg (ref 26.0–34.0)
MCHC: 32.1 g/dL (ref 30.0–36.0)
MCV: 88.3 fL (ref 78.0–100.0)
MONO ABS: 0.5 10*3/uL (ref 0.1–1.0)
MONOS PCT: 7 % (ref 3–12)
Neutro Abs: 3.9 10*3/uL (ref 1.7–7.7)
Neutrophils Relative %: 55 % (ref 43–77)
Platelets: 246 10*3/uL (ref 150–400)
RBC: 3.92 MIL/uL (ref 3.87–5.11)
RDW: 14.1 % (ref 11.5–15.5)
WBC: 7.1 10*3/uL (ref 4.0–10.5)

## 2013-12-02 LAB — COMPREHENSIVE METABOLIC PANEL
ALBUMIN: 3.6 g/dL (ref 3.5–5.2)
ALK PHOS: 61 U/L (ref 39–117)
ALT: 13 U/L (ref 0–35)
ANION GAP: 15 (ref 5–15)
AST: 14 U/L (ref 0–37)
BUN: 10 mg/dL (ref 6–23)
CO2: 22 mEq/L (ref 19–32)
CREATININE: 0.94 mg/dL (ref 0.50–1.10)
Calcium: 9.2 mg/dL (ref 8.4–10.5)
Chloride: 104 mEq/L (ref 96–112)
GFR calc non Af Amer: 82 mL/min — ABNORMAL LOW (ref >90)
GLUCOSE: 104 mg/dL — AB (ref 70–99)
Potassium: 3.6 mEq/L — ABNORMAL LOW (ref 3.7–5.3)
Sodium: 141 mEq/L (ref 137–147)
TOTAL PROTEIN: 7 g/dL (ref 6.0–8.3)
Total Bilirubin: <0.2 mg/dL — ABNORMAL LOW (ref 0.3–1.2)

## 2013-12-02 LAB — URINALYSIS, ROUTINE W REFLEX MICROSCOPIC
BILIRUBIN URINE: NEGATIVE
Glucose, UA: NEGATIVE mg/dL
HGB URINE DIPSTICK: NEGATIVE
Ketones, ur: NEGATIVE mg/dL
Leukocytes, UA: NEGATIVE
NITRITE: NEGATIVE
PROTEIN: NEGATIVE mg/dL
SPECIFIC GRAVITY, URINE: 1.009 (ref 1.005–1.030)
UROBILINOGEN UA: 0.2 mg/dL (ref 0.0–1.0)
pH: 5.5 (ref 5.0–8.0)

## 2013-12-02 LAB — WET PREP, GENITAL
TRICH WET PREP: NONE SEEN
WBC, Wet Prep HPF POC: NONE SEEN
Yeast Wet Prep HPF POC: NONE SEEN

## 2013-12-02 LAB — POC URINE PREG, ED: PREG TEST UR: NEGATIVE

## 2013-12-02 LAB — LIPASE, BLOOD: LIPASE: 29 U/L (ref 11–59)

## 2013-12-02 MED ORDER — DICYCLOMINE HCL 10 MG/ML IM SOLN
20.0000 mg | Freq: Once | INTRAMUSCULAR | Status: AC
  Administered 2013-12-02: 20 mg via INTRAMUSCULAR
  Filled 2013-12-02: qty 2

## 2013-12-02 MED ORDER — DICYCLOMINE HCL 20 MG PO TABS: 20.0000 mg | ORAL_TABLET | Freq: Two times a day (BID) | ORAL | Status: DC

## 2013-12-02 MED ORDER — FAMOTIDINE 20 MG PO TABS: 20.0000 mg | ORAL_TABLET | Freq: Two times a day (BID) | ORAL | Status: DC

## 2013-12-02 MED ORDER — SUCRALFATE 1 G PO TABS: 1.0000 g | ORAL_TABLET | Freq: Three times a day (TID) | ORAL | Status: DC

## 2013-12-02 NOTE — ED Provider Notes (Signed)
CSN: [id number]     Arrival date & time 12/02/13  1156 History   First MD Initiated Contact with Patient 12/02/13 1210     Chief Complaint  Patient presents with  . Nausea  . Abdominal Pain     (Consider location/radiation/quality/duration/timing/severity/associated sxs/prior Treatment) HPI Robin Horn is a 28 y.o. female who presents to emergency department complaining of abdominal pain. Patient states she has had recurrent abdominal pains, and has been evaluated by her primary care Dr. She states she has had lower into ultrasounds which showed no findings. Patient states her pain and nausea worsen after eating, nothing makes it better. She's not taking any medications for this time. She states she's having normal regular bowel movements daily, last one was this morning. She denies any vomiting. No fever, chills. No chest pain or shortness of breath. No back pain. States she feels pressure with urinating, no urinary urgency or frequency. No hematuria. Patient states her primary care Dr. told that he was cut refer her to a specialist but this has not been arranged yet.  Past Medical History  Diagnosis Date  . Abnormal Pap smear of cervix   . Gonorrhea   . Chlamydia   . Herpes   . Pregnant state, incidental   . Migraines   . Depression   . Anxiety   . UTI (lower urinary tract infection)   . Preeclampsia   . Ovarian cyst    Past Surgical History  Procedure Laterality Date  . Colposcopy    . Dilation and curettage of uterus    . Tonsillectomy     Family History  Problem Relation Age of Onset  . Other Neg Hx   . Kidney disease Mother   . Cancer Paternal Grandmother    History  Substance Use Topics  . Smoking status: Current Every Day Smoker -- 0.25 packs/day    Types: Cigarettes  . Smokeless tobacco: Never Used  . Alcohol Use: Yes     Comment: social   OB History   Grav Para Term Preterm Abortions TAB SAB Ect Mult Living   7 4 4  0 3 3 0 0 0 4     Review of Systems   Constitutional: Negative for fever and chills.  Respiratory: Negative for cough, chest tightness and shortness of breath.   Cardiovascular: Negative for chest pain, palpitations and leg swelling.  Gastrointestinal: Positive for nausea and abdominal pain. Negative for vomiting and diarrhea.  Genitourinary: Negative for dysuria, flank pain, vaginal bleeding, vaginal discharge, vaginal pain and pelvic pain.  Musculoskeletal: Negative for arthralgias, myalgias, neck pain and neck stiffness.  Skin: Negative for rash.  Neurological: Negative for dizziness, weakness and headaches.  All other systems reviewed and are negative.     Allergies  Review of patient's allergies indicates no known allergies.  Home Medications   Prior to Admission medications   Medication Sig Start Date End Date Taking? Authorizing Provider  ibuprofen (ADVIL,MOTRIN) 800 MG tablet Take 800 mg by mouth every 8 (eight) hours as needed (For stomach pain.).   Yes Historical Provider, MD  QUEtiapine (SEROQUEL) 100 MG tablet Take 100 mg by mouth 3 (three) times daily.   Yes Historical Provider, MD  valACYclovir (VALTREX) 500 MG tablet Take 1,000 mg by mouth daily as needed (For outbreaks.).    Yes Historical Provider, MD   BP 137/85  Pulse 76  Temp(Src) 98.9 F (37.2 C) (Oral)  Resp 18  SpO2 100%  LMP 11/25/2013 Physical Exam  Nursing note  and vitals reviewed. Constitutional: She appears well-developed and well-nourished. No distress.  HENT:  Head: Normocephalic.  Eyes: Conjunctivae are normal.  Neck: Neck supple.  Cardiovascular: Normal rate, regular rhythm and normal heart sounds.   Pulmonary/Chest: Effort normal and breath sounds normal. No respiratory distress. She has no wheezes. She has no rales.  Abdominal: Soft. Bowel sounds are normal. She exhibits no distension. There is tenderness. There is no rebound.  Diffuse tenderness  Genitourinary:  Normal external genitalia. Normal vaginal canal. Small thin  white discharge. Cervix is normal, closed. No CMT. No uterine or adnexal tenderness. No masses palpated.    Musculoskeletal: She exhibits no edema.  Neurological: She is alert.  Skin: Skin is warm and dry.  Psychiatric: She has a normal mood and affect. Her behavior is normal.    ED Course  Procedures (including critical care time) Labs Review Labs Reviewed  WET PREP, GENITAL - Abnormal; Notable for the following:    Clue Cells Wet Prep HPF POC FEW (*)    All other components within normal limits  CBC WITH DIFFERENTIAL - Abnormal; Notable for the following:    Hemoglobin 11.1 (*)    HCT 34.6 (*)    All other components within normal limits  COMPREHENSIVE METABOLIC PANEL - Abnormal; Notable for the following:    Potassium 3.6 (*)    Glucose, Bld 104 (*)    Total Bilirubin <0.2 (*)    GFR calc non Af Amer 82 (*)    All other components within normal limits  URINALYSIS, ROUTINE W REFLEX MICROSCOPIC - Abnormal; Notable for the following:    APPearance CLOUDY (*)    All other components within normal limits  GC/CHLAMYDIA PROBE AMP  LIPASE, BLOOD  POC URINE PREG, ED    Imaging Review No results found.   EKG Interpretation None      MDM   Final diagnoses:  Abdominal pain, unspecified abdominal location    Patient area with generalized diffuse abdominal pain for several weeks, nausea, no vomiting. Urine pregnancy negative. Labs unremarkable other than slight anemia, hemoglobin 11.1. Pelvic exam unremarkable. Patient does state that she gets worse epigastric pain after eating, she states she has had ultrasound abdomen which was normal. Most likely gastroesophageal reflux disease, possibly PUD. Will start on Pepcid and Prilosec. Will add Carafate. Also will try some Bentyl for possible intestinal spasms. We'll discharge home with close outpatient followup. Patient is otherwise nontoxic. No peritoneal signs. She's afebrile. She is comfortable appearing. No vomiting.  Filed  Vitals:   12/02/13 1219 12/02/13 1424  BP: 137/85 121/81  Pulse: 76 70  Temp: 98.9 F (37.2 C) 98.1 F (36.7 C)  TempSrc: Oral Oral  Resp: 18 16  SpO2: 100% 100%       Florene Route Ramces Shomaker, PA-C 12/02/13 1547

## 2013-12-02 NOTE — ED Notes (Signed)
Pt c/o abd pain x 1 week, stats she feels nausueos but no vomiting.

## 2013-12-02 NOTE — Discharge Instructions (Signed)
pepcid as prescribed daily. Carafate with meals as needed. Bentyl for abdominal spasms. Follow up with primary care doctor.    Abdominal Pain, Women Abdominal (stomach, pelvic, or belly) pain can be caused by many things. It is important to tell your doctor:  The location of the pain.  Does it come and go or is it present all the time?  Are there things that start the pain (eating certain foods, exercise)?  Are there other symptoms associated with the pain (fever, nausea, vomiting, diarrhea)? All of this is helpful to know when trying to find the cause of the pain. CAUSES   Stomach: virus or bacteria infection, or ulcer.  Intestine: appendicitis (inflamed appendix), regional ileitis (Crohn's disease), ulcerative colitis (inflamed colon), irritable bowel syndrome, diverticulitis (inflamed diverticulum of the colon), or cancer of the stomach or intestine.  Gallbladder disease or stones in the gallbladder.  Kidney disease, kidney stones, or infection.  Pancreas infection or cancer.  Fibromyalgia (pain disorder).  Diseases of the female organs:  Uterus: fibroid (non-cancerous) tumors or infection.  Fallopian tubes: infection or tubal pregnancy.  Ovary: cysts or tumors.  Pelvic adhesions (scar tissue).  Endometriosis (uterus lining tissue growing in the pelvis and on the pelvic organs).  Pelvic congestion syndrome (female organs filling up with blood just before the menstrual period).  Pain with the menstrual period.  Pain with ovulation (producing an egg).  Pain with an IUD (intrauterine device, birth control) in the uterus.  Cancer of the female organs.  Functional pain (pain not caused by a disease, may improve without treatment).  Psychological pain.  Depression. DIAGNOSIS  Your doctor will decide the seriousness of your pain by doing an examination.  Blood tests.  X-rays.  Ultrasound.  CT scan (computed tomography, special type of X-ray).  MRI  (magnetic resonance imaging).  Cultures, for infection.  Barium enema (dye inserted in the large intestine, to better view it with X-rays).  Colonoscopy (looking in intestine with a lighted tube).  Laparoscopy (minor surgery, looking in abdomen with a lighted tube).  Major abdominal exploratory surgery (looking in abdomen with a large incision). TREATMENT  The treatment will depend on the cause of the pain.   Many cases can be observed and treated at home.  Over-the-counter medicines recommended by your caregiver.  Prescription medicine.  Antibiotics, for infection.  Birth control pills, for painful periods or for ovulation pain.  Hormone treatment, for endometriosis.  Nerve blocking injections.  Physical therapy.  Antidepressants.  Counseling with a psychologist or psychiatrist.  Minor or major surgery. HOME CARE INSTRUCTIONS   Do not take laxatives, unless directed by your caregiver.  Take over-the-counter pain medicine only if ordered by your caregiver. Do not take aspirin because it can cause an upset stomach or bleeding.  Try a clear liquid diet (broth or water) as ordered by your caregiver. Slowly move to a bland diet, as tolerated, if the pain is related to the stomach or intestine.  Have a thermometer and take your temperature several times a day, and record it.  Bed rest and sleep, if it helps the pain.  Avoid sexual intercourse, if it causes pain.  Avoid stressful situations.  Keep your follow-up appointments and tests, as your caregiver orders.  If the pain does not go away with medicine or surgery, you may try:  Acupuncture.  Relaxation exercises (yoga, meditation).  Group therapy.  Counseling. SEEK MEDICAL CARE IF:   You notice certain foods cause stomach pain.  Your home care treatment  is not helping your pain.  You need stronger pain medicine.  You want your IUD removed.  You feel faint or lightheaded.  You develop nausea and  vomiting.  You develop a rash.  You are having side effects or an allergy to your medicine. SEEK IMMEDIATE MEDICAL CARE IF:   Your pain does not go away or gets worse.  You have a fever.  Your pain is felt only in portions of the abdomen. The right side could possibly be appendicitis. The left lower portion of the abdomen could be colitis or diverticulitis.  You are passing blood in your stools (bright red or black tarry stools, with or without vomiting).  You have blood in your urine.  You develop chills, with or without a fever.  You pass out. MAKE SURE YOU:   Understand these instructions.  Will watch your condition.  Will get help right away if you are not doing well or get worse. Document Released: 02/12/2007 Document Revised: 09/01/2013 Document Reviewed: 03/04/2009 [facility] Patient Information 2015 [place], [place]. This information is not intended to replace advice given to you by your health care provider. Make sure you discuss any questions you have with your health care provider.

## 2013-12-02 NOTE — ED Provider Notes (Signed)
Medical screening examination/treatment/procedure(s) were performed by non-physician practitioner and as supervising physician I was immediately available for consultation/collaboration.  Richarda Blade, MD 12/02/13 [phone]

## 2013-12-03 LAB — GC/CHLAMYDIA PROBE AMP
CT PROBE, AMP APTIMA: POSITIVE — AB
GC Probe RNA: NEGATIVE

## 2013-12-04 ENCOUNTER — Telehealth (HOSPITAL_BASED_OUTPATIENT_CLINIC_OR_DEPARTMENT_OTHER): Payer: Self-pay | Admitting: Emergency Medicine

## 2013-12-04 NOTE — Telephone Encounter (Signed)
+  Chlamydia. Chart sent to [place] office for review. [facility] attached.

## 2013-12-06 NOTE — Telephone Encounter (Signed)
Chart returned from [place] office. Per Jeannett Senior PA-C, give Azithromycin 250 mg, #4, take 1 gram PO once

## 2013-12-07 ENCOUNTER — Telehealth (HOSPITAL_BASED_OUTPATIENT_CLINIC_OR_DEPARTMENT_OTHER): Payer: Self-pay

## 2013-12-07 NOTE — Telephone Encounter (Signed)
Pt returned call.  ID verified x 2.  Pt informed of Dx, need for addl tx, notify partner(s) for testing and tx and abstain from sex x 2 wks post tx.  [facility] completed and faxed ans rx called and left on VM @ [facility] [phone]

## 2013-12-13 ENCOUNTER — Encounter (HOSPITAL_COMMUNITY): Payer: Self-pay | Admitting: Emergency Medicine

## 2013-12-13 ENCOUNTER — Emergency Department (HOSPITAL_COMMUNITY): Payer: Medicaid Other

## 2013-12-13 ENCOUNTER — Emergency Department (HOSPITAL_COMMUNITY)
Admission: EM | Admit: 2013-12-13 | Discharge: 2013-12-13 | Disposition: A | Discharge status: Home / Self Care | Payer: Medicaid Other | Attending: Emergency Medicine | Admitting: Emergency Medicine

## 2013-12-13 DIAGNOSIS — R102 Pelvic and perineal pain: Secondary | ICD-10-CM

## 2013-12-13 DIAGNOSIS — F3289 Other specified depressive episodes: Secondary | ICD-10-CM | POA: Diagnosis not present

## 2013-12-13 DIAGNOSIS — Z87440 Personal history of urinary (tract) infections: Secondary | ICD-10-CM | POA: Diagnosis not present

## 2013-12-13 DIAGNOSIS — F329 Major depressive disorder, single episode, unspecified: Secondary | ICD-10-CM | POA: Insufficient documentation

## 2013-12-13 DIAGNOSIS — F17200 Nicotine dependence, unspecified, uncomplicated: Secondary | ICD-10-CM | POA: Diagnosis not present

## 2013-12-13 DIAGNOSIS — N949 Unspecified condition associated with female genital organs and menstrual cycle: Secondary | ICD-10-CM | POA: Insufficient documentation

## 2013-12-13 DIAGNOSIS — N898 Other specified noninflammatory disorders of vagina: Secondary | ICD-10-CM

## 2013-12-13 DIAGNOSIS — A64 Unspecified sexually transmitted disease: Secondary | ICD-10-CM | POA: Insufficient documentation

## 2013-12-13 DIAGNOSIS — F411 Generalized anxiety disorder: Secondary | ICD-10-CM | POA: Insufficient documentation

## 2013-12-13 DIAGNOSIS — Z8669 Personal history of other diseases of the nervous system and sense organs: Secondary | ICD-10-CM | POA: Diagnosis not present

## 2013-12-13 LAB — WET PREP, GENITAL
Trich, Wet Prep: NONE SEEN
WBC WET PREP: NONE SEEN
Yeast Wet Prep HPF POC: NONE SEEN

## 2013-12-13 MED ORDER — AZITHROMYCIN 250 MG PO TABS
1000.0000 mg | ORAL_TABLET | Freq: Once | ORAL | Status: AC
  Administered 2013-12-13: 1000 mg via ORAL
  Filled 2013-12-13: qty 4

## 2013-12-13 MED ORDER — LIDOCAINE HCL 1 % IJ SOLN
INTRAMUSCULAR | Status: AC
  Filled 2013-12-13: qty 20

## 2013-12-13 MED ORDER — CEFTRIAXONE SODIUM 250 MG IJ SOLR
250.0000 mg | Freq: Once | INTRAMUSCULAR | Status: AC
  Administered 2013-12-13: 250 mg via INTRAMUSCULAR
  Filled 2013-12-13: qty 250

## 2013-12-13 MED ORDER — IBUPROFEN 600 MG PO TABS: 600.0000 mg | ORAL_TABLET | Freq: Four times a day (QID) | ORAL | Status: DC | PRN

## 2013-12-13 MED ORDER — AZITHROMYCIN 250 MG PO TABS: 1000.0000 mg | ORAL_TABLET | Freq: Every day | ORAL | Status: DC

## 2013-12-13 NOTE — ED Provider Notes (Signed)
CSN: [id number]     Arrival date & time 12/13/13  1520 History   First MD Initiated Contact with Patient 12/13/13 1550     Chief Complaint  Patient presents with  . SEXUALLY TRANSMITTED DISEASE     (Consider location/radiation/quality/duration/timing/severity/associated sxs/prior Treatment) HPI Comments: Patient with h/o + chlamydia on 08/04 treated with PO azithromycin -- presents with continued lower abdominal cramping, vaginal discharge. Patient states that she had sexual intercourse with same partner 2 days after taking oral medications. Patient has had persistent symptoms and requests to be rechecked/retreated. She told her partner to be checked and treated however she is unsure that he has actually done this. Patient has had same partner. No fever, nausea, vomiting, diarrhea. No dysuria or hematuria. The onset of this condition was acute. The course is constant. Aggravating factors: none. Alleviating factors: none.    The history is provided by the patient.    Past Medical History  Diagnosis Date  . Abnormal Pap smear of cervix   . Gonorrhea   . Chlamydia   . Herpes   . Pregnant state, incidental   . Migraines   . Depression   . Anxiety   . UTI (lower urinary tract infection)   . Preeclampsia   . Ovarian cyst    Past Surgical History  Procedure Laterality Date  . Colposcopy    . Dilation and curettage of uterus    . Tonsillectomy     Family History  Problem Relation Age of Onset  . Other Neg Hx   . Kidney disease Mother   . Cancer Paternal Grandmother    History  Substance Use Topics  . Smoking status: Current Every Day Smoker -- 0.25 packs/day    Types: Cigarettes  . Smokeless tobacco: Never Used  . Alcohol Use: Yes     Comment: social   OB History   Grav Para Term Preterm Abortions TAB SAB Ect Mult Living   7 4 4  0 3 3 0 0 0 4     Review of Systems  Constitutional: Negative for fever.  HENT: Negative for sore throat.   Eyes: Negative for discharge.   Gastrointestinal: Negative for rectal pain.  Genitourinary: Positive for vaginal discharge and pelvic pain. Negative for dysuria, frequency, vaginal bleeding and genital sores.  Musculoskeletal: Negative for arthralgias.  Skin: Negative for rash.  Hematological: Negative for adenopathy.   Allergies  Review of patient's allergies indicates no known allergies.  Home Medications   Prior to Admission medications   Medication Sig Start Date End Date Taking? Authorizing Provider  ibuprofen (ADVIL,MOTRIN) 800 MG tablet Take 800 mg by mouth every 8 (eight) hours as needed (For stomach pain.).   Yes Historical Provider, MD  QUEtiapine (SEROQUEL) 100 MG tablet Take 100 mg by mouth at bedtime.    Yes Historical Provider, MD  valACYclovir (VALTREX) 500 MG tablet Take 1,000 mg by mouth daily as needed (For outbreaks.).    Yes Historical Provider, MD   BP 142/85  Pulse 87  Temp(Src) 99.4 F (37.4 C) (Oral)  Resp 16  SpO2 100%  LMP 11/25/2013  Physical Exam  Nursing note and vitals reviewed. Constitutional: She appears well-developed and well-nourished.  HENT:  Head: Normocephalic and atraumatic.  Eyes: Conjunctivae are normal. Right eye exhibits no discharge. Left eye exhibits no discharge.  Neck: Normal range of motion. Neck supple.  Cardiovascular: Normal rate, regular rhythm and normal heart sounds.   Pulmonary/Chest: Effort normal and breath sounds normal.  Abdominal: Soft. There is  no tenderness.  Genitourinary: Uterus is not tender. Cervix exhibits motion tenderness (mild). Cervix exhibits no discharge and no friability. Right adnexum displays tenderness. Right adnexum displays no mass. Left adnexum displays no mass and no tenderness.  Neurological: She is alert.  Skin: Skin is warm and dry.  Psychiatric: She has a normal mood and affect.    ED Course  Procedures (including critical care time) Labs Review Labs Reviewed  GC/CHLAMYDIA PROBE AMP  WET PREP, GENITAL  RPR  HIV  ANTIBODY (ROUTINE TESTING)    Imaging Review No results found.   EKG Interpretation None      4:02 PM Patient seen and examined. Will repeat pelvic exam, check RPR/HIV. Will eval for signs of PID. Work-up initiated.    Vital signs reviewed and are as follows: BP 142/85  Pulse 87  Temp(Src) 99.4 F (37.4 C) (Oral)  Resp 16  SpO2 100%  LMP 11/25/2013  4:42 PM Pelvic exam performed with NT chaperone. Given adnexal tenderness and mild CMT, will treat for PID and obtain US to rule out complication. Pt agrees.   5:16 PM Called to patient room. She c/o pain with blood draw and refused completion of blood draw. She states that she needs to leave to pick up her child. She does not want to wait for [place]. I explained that this is how we would see any complication of infection, and that her infection could become worse, she could become sterile if worsened.   She states that she understands. She states that she will be able to go to her PCP walk-in clinic in 2 days. I encouraged her to do this, return with worsening, return if she changes her mind.    MDM   Final diagnoses:  Vaginal discharge  Pelvic pain in female   Pt with pelvic pain, exam with mild findings of PID. I cannot r/o abscess or pyosalpinx without [place]. Pt refuses [place] and understands why this is important and understands she could get worse, become sterile if not treated appropriately. I have given repeat PID dose of azithro to take in 1 week.     Carlisle Cater, PA-C 12/13/13 1800

## 2013-12-13 NOTE — ED Notes (Signed)
Pt refuses blood draw, states she will have blood drawn at PCP office.

## 2013-12-13 NOTE — ED Notes (Signed)
Patient refused bloodwork and [place]. MD aware. Patient ambulatory and aware of decisions. No other questions/concerns. Given specific antibiotic regimen information. AVS in hand upon departure.

## 2013-12-13 NOTE — ED Notes (Signed)
She states she was seen here recently and dx with chlamydia.  She states she had sex with a partner who had not been treated, thereby re-infecting herself.  She denies any symptoms at present and is in no distress.

## 2013-12-13 NOTE — ED Notes (Signed)
Attempted lab draw twice, flash seen and patient stated "take it out it hurts." RN made aware.

## 2013-12-13 NOTE — Discharge Instructions (Signed)
Please read and follow all provided instructions.  Your diagnoses today include:  1. Vaginal discharge   2. Pelvic pain in female    Tests performed today include:  Test for gonorrhea and chlamydia. You will be notified by telephone if you have a positive result.  You declined testing for syphilis and HIV.   You declined Ultrasound to look for abscess  Vital signs. See below for your results today.   Medications:  You were treated for chlamydia (1 gram azithromycin pills) and gonorrhea (250mg  rocephin shot).  You will need to take an additional dose of azithromycin one week after today -- on 12/20/2013.   Home care instructions:  Read educational materials contained in this packet and follow any instructions provided.   You should tell your partners about your infection and avoid having sex for one week to allow time for the medicine to work.  Follow-up instructions: See your doctor in 2 days as we discussed.   You should follow-up with the [facility]D clinic to be tested for HIV, syphilis -- all of which can be transmitted by sexual contact. We do not routinely screen for these in the Emergency Department.    STD Testing:  [place] [place] [facility]  [address], [place], phone [phone] or [phone]    Monday - Friday, call for an appointment  [place] [place] [facility]  [address], [place], phone [phone] or [phone]   Monday - Friday, call for an appointment  Return instructions:   Please return to the Emergency Department if you experience worsening symptoms.   Please return if you have any other emergent concerns.  Additional Information:  Your vital signs today were: BP 150/86   Pulse 83   Temp(Src) 99.4 F (37.4 C) (Oral)   Resp 16   SpO2 100%   LMP 11/25/2013 If your blood pressure (BP) was elevated above 135/85 this visit, please have  this repeated by your doctor within one month. --------------

## 2013-12-14 NOTE — ED Provider Notes (Signed)
And a.att  Orlie Dakin, MD 12/14/13 [phone]

## 2013-12-15 LAB — GC/CHLAMYDIA PROBE AMP
CT Probe RNA: POSITIVE — AB
GC Probe RNA: NEGATIVE

## 2013-12-16 ENCOUNTER — Telehealth (HOSPITAL_BASED_OUTPATIENT_CLINIC_OR_DEPARTMENT_OTHER): Payer: Self-pay | Admitting: Emergency Medicine

## 2013-12-20 ENCOUNTER — Telehealth (HOSPITAL_BASED_OUTPATIENT_CLINIC_OR_DEPARTMENT_OTHER): Payer: Self-pay | Admitting: Emergency Medicine

## 2013-12-21 ENCOUNTER — Telehealth (HOSPITAL_BASED_OUTPATIENT_CLINIC_OR_DEPARTMENT_OTHER): Payer: Self-pay

## 2013-12-21 NOTE — Telephone Encounter (Signed)
Unable to reach x 2.  Letter sent to [facility] address.

## 2014-01-15 ENCOUNTER — Telehealth (HOSPITAL_COMMUNITY): Payer: Self-pay

## 2014-01-15 NOTE — ED Notes (Signed)
Unable to contact pt by mail or telephone. Unable to communicate lab results or treatment changes. 

## 2014-01-27 ENCOUNTER — Inpatient Hospital Stay (HOSPITAL_COMMUNITY)
Admission: AD | Admit: 2014-01-27 | Discharge: 2014-01-27 | Disposition: A | Discharge status: Home / Self Care | Payer: Medicaid Other | Source: Ambulatory Visit | Attending: Obstetrics and Gynecology | Admitting: Obstetrics and Gynecology

## 2014-01-27 ENCOUNTER — Inpatient Hospital Stay (HOSPITAL_COMMUNITY): Payer: Medicaid Other

## 2014-01-27 ENCOUNTER — Encounter (HOSPITAL_COMMUNITY): Payer: Self-pay | Admitting: *Deleted

## 2014-01-27 DIAGNOSIS — A5901 Trichomonal vulvovaginitis: Secondary | ICD-10-CM | POA: Insufficient documentation

## 2014-01-27 DIAGNOSIS — F17200 Nicotine dependence, unspecified, uncomplicated: Secondary | ICD-10-CM | POA: Diagnosis not present

## 2014-01-27 DIAGNOSIS — N898 Other specified noninflammatory disorders of vagina: Secondary | ICD-10-CM | POA: Diagnosis present

## 2014-01-27 LAB — CBC
HCT: 36.4 % (ref 36.0–46.0)
Hemoglobin: 11.8 g/dL — ABNORMAL LOW (ref 12.0–15.0)
MCH: 28.5 pg (ref 26.0–34.0)
MCHC: 32.4 g/dL (ref 30.0–36.0)
MCV: 87.9 fL (ref 78.0–100.0)
PLATELETS: 214 10*3/uL (ref 150–400)
RBC: 4.14 MIL/uL (ref 3.87–5.11)
RDW: 13.9 % (ref 11.5–15.5)
WBC: 7.1 10*3/uL (ref 4.0–10.5)

## 2014-01-27 LAB — URINALYSIS, ROUTINE W REFLEX MICROSCOPIC
BILIRUBIN URINE: NEGATIVE
Glucose, UA: NEGATIVE mg/dL
Hgb urine dipstick: NEGATIVE
KETONES UR: NEGATIVE mg/dL
NITRITE: NEGATIVE
Protein, ur: NEGATIVE mg/dL
SPECIFIC GRAVITY, URINE: 1.01 (ref 1.005–1.030)
Urobilinogen, UA: 0.2 mg/dL (ref 0.0–1.0)
pH: 5.5 (ref 5.0–8.0)

## 2014-01-27 LAB — URINE MICROSCOPIC-ADD ON

## 2014-01-27 LAB — WET PREP, GENITAL
CLUE CELLS WET PREP: NONE SEEN
Yeast Wet Prep HPF POC: NONE SEEN

## 2014-01-27 LAB — POCT PREGNANCY, URINE: Preg Test, Ur: NEGATIVE

## 2014-01-27 MED ORDER — METRONIDAZOLE 500 MG PO TABS: 2000.0000 mg | ORAL_TABLET | Freq: Once | ORAL | Status: DC

## 2014-01-27 NOTE — MAU Note (Signed)
Pt gave permission to ask questions and talk in front of visitor.  abd pain, lower abd, started about a wk ago.  Feeling sick, has been hot/cold.  Period is a wk late. Neg HPT.  Has a discharge, thick, white gooey- noted a couple days ago.

## 2014-01-27 NOTE — MAU Provider Note (Signed)
History     CSN: [id number]  Arrival date and time: 01/27/14 1637   First Provider Initiated Contact with Patient 01/27/14 1734      Chief Complaint  Patient presents with  . Possible Pregnancy  . Abdominal Pain  . Vaginal Discharge  . Nausea   HPI  Robin Horn is a 28 y.o. [id number] who presents today with vaginal discharge. She states that she was treated for chlamydia in August, and her female partner was chlamydia. However, she also has a female partner, and she is unsure if she was treated. She states that her female partner was also just recently exposed to trichomoniasis. She would like testing today.   Past Medical History  Diagnosis Date  . Abnormal Pap smear of cervix   . Gonorrhea   . Chlamydia   . Herpes   . Pregnant state, incidental   . Migraines   . Depression   . Anxiety   . UTI (lower urinary tract infection)   . Preeclampsia   . Ovarian cyst     Past Surgical History  Procedure Laterality Date  . Colposcopy    . Dilation and curettage of uterus    . Tonsillectomy      Family History  Problem Relation Age of Onset  . Other Neg Hx   . Kidney disease Mother   . Cancer Paternal Grandmother     History  Substance Use Topics  . Smoking status: Current Every Day Smoker -- 0.25 packs/day    Types: Cigarettes, Cigars  . Smokeless tobacco: Never Used  . Alcohol Use: Yes     Comment: social    Allergies: No Known Allergies  Prescriptions prior to admission  Medication Sig Dispense Refill  . Multiple Vitamins-Minerals (MULTIVITAMIN PO) Take 1 tablet by mouth daily.      . QUEtiapine (SEROQUEL) 100 MG tablet Take 100 mg by mouth at bedtime.       . valACYclovir (VALTREX) 500 MG tablet Take 500 mg by mouth 2 (two) times daily as needed (For outbreaks.).         ROS Physical Exam   Blood pressure 135/90, pulse 89, temperature 98.5 F (36.9 C), temperature source Oral, resp. rate 18, height 5' 1.25" (1.556 m), weight 74.844 kg (165 lb), last  menstrual period 12/24/2013.  Physical Exam  Nursing note and vitals reviewed. Constitutional: She is oriented to person, place, and time. She appears well-developed and well-nourished. No distress.  Cardiovascular: Normal rate.   Respiratory: Effort normal.  GI: Soft. There is no tenderness. There is no rebound.  Genitourinary:   External: no lesion Vagina: small amount of white discharge Cervix: pink, smooth, mild CMT  Uterus: NSSC Adnexa: NT   Neurological: She is alert and oriented to person, place, and time.  Skin: Skin is warm and dry.  Psychiatric: She has a normal mood and affect.    MAU Course  Procedures  Results for orders placed during the hospital encounter of 01/27/14 (from the past 24 hour(s))  URINALYSIS, ROUTINE W REFLEX MICROSCOPIC     Status: Abnormal   Collection Time    01/27/14  5:00 PM      Result Value Ref Range   Color, Urine YELLOW  YELLOW   APPearance CLEAR  CLEAR   Specific Gravity, Urine 1.010  1.005 - 1.030   pH 5.5  5.0 - 8.0   Glucose, UA NEGATIVE  NEGATIVE mg/dL   Hgb urine dipstick NEGATIVE  NEGATIVE   Bilirubin Urine NEGATIVE  NEGATIVE   Ketones, ur NEGATIVE  NEGATIVE mg/dL   Protein, ur NEGATIVE  NEGATIVE mg/dL   Urobilinogen, UA 0.2  0.0 - 1.0 mg/dL   Nitrite NEGATIVE  NEGATIVE   Leukocytes, UA SMALL (*) NEGATIVE  URINE MICROSCOPIC-ADD ON     Status: Abnormal   Collection Time    01/27/14  5:00 PM      Result Value Ref Range   Squamous Epithelial / LPF FEW (*) RARE   WBC, UA 3-6  <3 WBC/hpf   RBC / HPF 0-2  <3 RBC/hpf   Bacteria, UA FEW (*) RARE   Urine-Other TRICHOMONAS PRESENT    POCT PREGNANCY, URINE     Status: None   Collection Time    01/27/14  5:35 PM      Result Value Ref Range   Preg Test, Ur NEGATIVE  NEGATIVE  WET PREP, GENITAL     Status: Abnormal   Collection Time    01/27/14  5:57 PM      Result Value Ref Range   Yeast Wet Prep HPF POC NONE SEEN  NONE SEEN   Trich, Wet Prep FEW (*) NONE SEEN   Clue Cells  Wet Prep HPF POC NONE SEEN  NONE SEEN   WBC, Wet Prep HPF POC MODERATE (*) NONE SEEN  CBC     Status: Abnormal   Collection Time    01/27/14  6:04 PM      Result Value Ref Range   WBC 7.1  4.0 - 10.5 K/uL   RBC 4.14  3.87 - 5.11 MIL/uL   Hemoglobin 11.8 (*) 12.0 - 15.0 g/dL   HCT 36.4  36.0 - 46.0 %   MCV 87.9  78.0 - 100.0 fL   MCH 28.5  26.0 - 34.0 pg   MCHC 32.4  30.0 - 36.0 g/dL   RDW 13.9  11.5 - 15.5 %   Platelets 214  150 - 400 K/uL   US Transvaginal Non-ob  01/27/2014   CLINICAL DATA:  Pelvic and back pain for 1 week with cervical motion tenderness on physical examination. LMP 12/24/2013.  EXAM: TRANSABDOMINAL AND TRANSVAGINAL ULTRASOUND OF PELVIS  TECHNIQUE: Both transabdominal and transvaginal ultrasound examinations of the pelvis were performed. Transabdominal technique was performed for global imaging of the pelvis including uterus, ovaries, adnexal regions, and pelvic cul-de-sac. It was necessary to proceed with endovaginal exam following the transabdominal exam to visualize the endometrium and ovaries to better advantage.  COMPARISON:  Pelvic ultrasound 07/01/2013  FINDINGS: Uterus  Measurements: 8.2 x 5.1 x 6.0 cm. No fibroids or other mass visualized.  Endometrium  Thickness: 7 mm.  No focal abnormality visualized.  Right ovary  Measurements: 3.6 x 1.4 x 1.6 cm. Normal appearance/no adnexal mass.  Left ovary  Measurements: 4.4 x 3.0 x 2.8 cm. There is a small mildly complex cyst or follicle, measuring 1.9 cm maximally.  Other findings  No free fluid.  IMPRESSION: No acute or significant findings. No evidence of pelvic inflammatory disease. Small collapsing left ovarian follicle.   Electronically Signed   By: Camie Patience M.D.   On: 01/27/2014 19:14   US Pelvis Complete  01/27/2014   CLINICAL DATA:  Pelvic and back pain for 1 week with cervical motion tenderness on physical examination. LMP 12/24/2013.  EXAM: TRANSABDOMINAL AND TRANSVAGINAL ULTRASOUND OF PELVIS  TECHNIQUE: Both  transabdominal and transvaginal ultrasound examinations of the pelvis were performed. Transabdominal technique was performed for global imaging of the pelvis including uterus, ovaries, adnexal regions,  and pelvic cul-de-sac. It was necessary to proceed with endovaginal exam following the transabdominal exam to visualize the endometrium and ovaries to better advantage.  COMPARISON:  Pelvic ultrasound 07/01/2013  FINDINGS: Uterus  Measurements: 8.2 x 5.1 x 6.0 cm. No fibroids or other mass visualized.  Endometrium  Thickness: 7 mm.  No focal abnormality visualized.  Right ovary  Measurements: 3.6 x 1.4 x 1.6 cm. Normal appearance/no adnexal mass.  Left ovary  Measurements: 4.4 x 3.0 x 2.8 cm. There is a small mildly complex cyst or follicle, measuring 1.9 cm maximally.  Other findings  No free fluid.  IMPRESSION: No acute or significant findings. No evidence of pelvic inflammatory disease. Small collapsing left ovarian follicle.   Electronically Signed   By: Camie Patience M.D.   On: 01/27/2014 19:14    Assessment and Plan   1. Trichomonal vaginitis   Discussed partner treatment and safer sex GC/CT and HIV pending     Medication List         metroNIDAZOLE 500 MG tablet  Commonly known as:  FLAGYL  Take 4 tablets (2,000 mg total) by mouth once.     MULTIVITAMIN PO  Take 1 tablet by mouth daily.     QUEtiapine 100 MG tablet  Commonly known as:  SEROQUEL  Take 100 mg by mouth at bedtime.     valACYclovir 500 MG tablet  Commonly known as:  VALTREX  Take 500 mg by mouth 2 (two) times daily as needed (For outbreaks.).       Follow-up Information   Schedule an appointment as soon as possible for a visit with [facility] [place].   Contact information:   [place] [address] [phone]       Mathis Bud 01/27/2014, 5:37 PM

## 2014-01-27 NOTE — MAU Note (Signed)
Patient's visitor stepped out of room. Patient states she was recently treated for chlamydia which she got from a female partner. Recently patient had intimate relations with her girlfriend and found later that the girlfriend tested + for trich. Patient voices concerns regarding STI's.

## 2014-01-27 NOTE — Discharge Instructions (Signed)
Trichomoniasis Trichomoniasis is an infection caused by an organism called Trichomonas. The infection can affect both women and men. In women, the outer female genitalia and the vagina are affected. In men, the penis is mainly affected, but the prostate and other reproductive organs can also be involved. Trichomoniasis is a sexually transmitted infection (STI) and is most often passed to another person through sexual contact.  RISK FACTORS  Having unprotected sexual intercourse.  Having sexual intercourse with an infected partner. SIGNS AND SYMPTOMS  Symptoms of trichomoniasis in women include:  Abnormal gray-green frothy vaginal discharge.  Itching and irritation of the vagina.  Itching and irritation of the area outside the vagina. Symptoms of trichomoniasis in men include:   Penile discharge with or without pain.  Pain during urination. This results from inflammation of the urethra. DIAGNOSIS  Trichomoniasis may be found during a Pap test or physical exam. Your health care provider may use one of the following methods to help diagnose this infection:  Examining vaginal discharge under a microscope. For men, urethral discharge would be examined.  Testing the pH of the vagina with a test tape.  Using a vaginal swab test that checks for the Trichomonas organism. A test is available that provides results within a few minutes.  Doing a culture test for the organism. This is not usually needed. TREATMENT   You may be given medicine to fight the infection. Women should inform their health care provider if they could be or are pregnant. Some medicines used to treat the infection should not be taken during pregnancy.  Your health care provider may recommend over-the-counter medicines or creams to decrease itching or irritation.  Your sexual partner will need to be treated if infected. HOME CARE INSTRUCTIONS   Take medicines only as directed by your health care provider.  Take  over-the-counter medicine for itching or irritation as directed by your health care provider.  Do not have sexual intercourse while you have the infection.  Women should not douche or wear tampons while they have the infection.  Discuss your infection with your partner. Your partner may have gotten the infection from you, or you may have gotten it from your partner.  Have your sex partner get examined and treated if necessary.  Practice safe, informed, and protected sex.  See your health care provider for other STI testing. SEEK MEDICAL CARE IF:   You still have symptoms after you finish your medicine.  You develop abdominal pain.  You have pain when you urinate.  You have bleeding after sexual intercourse.  You develop a rash.  Your medicine makes you sick or makes you throw up (vomit). MAKE SURE YOU:  Understand these instructions.  Will watch your condition.  Will get help right away if you are not doing well or get worse. Document Released: 10/11/2000 Document Revised: 09/01/2013 Document Reviewed: 01/27/2013 [facility] Patient Information 2015 [place], [place]. This information is not intended to replace advice given to you by your health care provider. Make sure you discuss any questions you have with your health care provider.  Are lesbian and bisexual women at risk of getting sexually transmitted infections (STIs)?  Women who have sex with women are at risk for STIs. Lesbian and bisexual women can transmit STIs to each other through:  Skin-to-skin contact Mucosa contact (e.g., mouth to vagina) Vaginal fluids Menstrual blood Sharing sex toys Some STIs are more common among lesbians and bisexual women and may be passed easily from woman to woman (such as  bacterial vaginosis). Other STIs are much less likely to be passed from woman to woman through sex (such as HIV). When lesbians get these less common STIs, it may be because they also have had sex with men, especially  when they were younger. It is also important to remember that some of the less common STIs may not be passed between women during sex, but through sharing needles used to inject drugs. Bisexual women may be more likely to get infected with STIs that are less common for lesbians, since bisexuals have typically had sex with men in the past or are presently having sex with a man.  Common STIs that can be passed between women include:  Bacterial vaginosis (vaj-uh-NOH-suhs) (BV). BV is more common in lesbian and bisexual women than in other women. The reason for this is unknown. BV often occurs in both members of lesbian couples.  The vagina normally has a balance of mostly "good" bacteria and fewer "harmful" bacteria. BV develops when the balance changes. With BV, there is an increase in harmful bacteria and a decrease in good bacteria.  Sometimes BV causes no symptoms. But over one-half of women with BV have vaginal itching or discharge with a fishy odor. BV can be treated with antibiotics.  Chlamydia (kluh-MI-dee-uh). Chlamydia is caused by bacteria. It's spread through vaginal, oral, or anal sex. It can damage the reproductive organs, such as the uterus, ovaries, and fallopian (fuh-LOH-pee-uhn) tubes. The symptoms of chlamydia are often mild -- in fact, it's known as a "silent infection." Because the symptoms are mild, you can pass it to someone else without even knowing you have it.  Chlamydia can be treated with antibiotics. Infections that are not treated, even if there are no symptoms, can lead to:  Lower abdominal pain Lower back pain Nausea Fever Pain during sex Bleeding between periods  Genital herpes. Genital herpes is an STI caused by the herpes simplex viruses type 1 (HSV-1) or type 2 (HSV-2). Most genital herpes is caused by HSV-2. HSV-1 can cause genital herpes. But it more commonly causes infections of the mouth and lips, called "fever blisters or "cold sores." You can spread oral  herpes to the genitals through oral sex.  Most people have few or no symptoms from a genital herpes infection. When symptoms do occur, they usually appear as one or more blisters on or around the genitals or rectum. The blisters break, leaving tender sores that may take up to four weeks to heal. Another outbreak can appear weeks or months later. But it almost always is less severe and shorter than the first outbreak.  Although the infection can stay in the body forever, the outbreaks tend to become less severe and occur less often over time. You can pass genital herpes to someone else even when you have no symptoms.  There is no cure for herpes. Drugs can be used to shorten and prevent outbreaks or reduce the spread of the virus to others.  Human papillomavirus (pap-uh-LOH-muh-vahy-ruhs) (HPV). HPV can cause genital warts. If left untreated, HPV can cause abnormal changes on the cervix that can lead to cancer. Most people don't know they're infected with HPV because they don't have symptoms. Usually the virus goes away on its own without causing harm. But not always. The Pap test checks for abnormal cell growths caused by HPV that can lead to cancer in women. If you are age 65 or older, your doctor may also do an HPV test with your Pap test. This  is a DNA test that detects most of the high-risk types of HPV. It helps with cervical cancer screening. If youre younger than 27 years old and have had an abnormal Pap test result, your doctor may give you an HPV test. This test will show if HPV caused the abnormal cells on your cervix.  Both men and women can spread the virus to others whether or not they have any symptoms. Lesbians and bisexual women can transmit HPV through direct genital skin-to-skin contact, touching, or sex toys used with other women. Lesbians who have had sex with men are also at risk of HPV infection. This is why regular Pap tests are just as important for lesbian and bisexual women as  they are for heterosexual women.  There is no treatment for HPV, but a healthy immune (body defense) system can usually fight off HPV infection. Two vaccines (Cervarix and Gardasil) can protect girls and young women against the types of HPV that cause most cervical cancers. The vaccines work best when given before a person's first sexual contact, when she could be exposed to HPV. Both vaccines are recommended for 39 and 27 year old-girls. But the vaccines also can be used in girls as young as 55 and in women through age 81 who did not get any or all of the shots when they were younger. These vaccines are given in a series of 3 shots. It is best to use the same vaccine brand for all 3 doses. Ask your doctor which brand vaccine is best for you. Gardasil also has benefits for men in preventing genital warts and anal cancer caused by HPV. It is approved for use in boys as young as 49 and for young men through age 27. The vaccine does not replace the need to wear condoms to lower your risk of getting other types of HPV and other sexually transmitted infections. If you do get HPV, there are treatments for diseases caused by it. Genital warts can be removed with medicine you apply yourself or treatments performed by your doctor. Cervical and other cancers caused by HPV are most treatable when found early. There are many options for cancer treatment.  Pubic lice. Also known as crabs, pubic lice are small parasites that live in the genital areas and other areas with coarse hair. Pubic lice are spread through direct contact with the genital area. They can also be spread through sheets, towels, or clothes. Pubic lice can be treated with creams or shampoos you can buy at the drug store.  Trichomoniasis (TRIK-uh-muh-NEYE-uh-suhss) or "Trich." Trichomoniasis is caused by a parasite that can be spread during sex. You can also get trichomoniasis from contact with damp, moist objects, such as towels or wet clothes. Symptoms  include:  Yellow, green, or gray vaginal discharge (often foamy) with a strong odor Discomfort during sex and when urinating Irritation and itching of the genital area Lower abdominal pain (in rare cases) Trichomoniasis can be treated with antibiotics.  Less common STIs that may affect lesbians and bisexual women include:  Gonorrhea (gon-uh-REE-uh). Gonorrhea is a common STI but is not commonly passed during woman to woman sex. However, it could be since it does live in vaginal fluid. It is caused by a type of bacteria that can grow in warm, moist areas of the reproductive tract, like the cervix, uterus, and fallopian tubes in women. It can grow in the urethra in men and women. It can also grow in the mouth, throat, eyes, and anus. Even when women  have symptoms, they are often mild and are sometimes thought to be from a bladder or other vaginal infection.  Symptoms include:  Pain or burning when urinating Yellowish and sometimes bloody vaginal discharge Bleeding between menstrual periods Gonorrhea can be treated with antibiotics.  Hepatitis (hep-uh-TYT-uhs) B. Hepatitis B is a liver disease caused by a virus. It is spread through bodily fluids, including blood, semen, and vaginal fluid. People can get hepatitis B through sexual contact, by sharing needles with an infected person, or through mother-to-child transmission at birth. Some women have no symptoms if they get infected with the virus.  Women with symptoms may have:  Mild fever Headache and muscle aches Tiredness Loss of appetite Nausea or vomiting Diarrhea Dark-colored urine and pale bowel movements Stomach pain Yellow skin and whites of eyes There is a vaccine that can protect you from hepatitis B.  HIV/AIDS. The human immunodeficiency virus (HIV) is spread through body fluids, such as blood, vaginal fluid, semen, and breast milk. It is primarily spread through sex with men or by sharing needles. Women who have sex with  women can spread HIV, but this is rare. Some women with HIV may have no symptoms for 10 years or more.  Women with HIV symptoms may have:  Extreme fatigue (tiredness) Rapid weight loss Frequent low-grade fevers and night sweats Frequent yeast infections (in the mouth) Vaginal yeast infections Other STIs Pelvic inflammatory disease (an infection of the uterus, ovaries, or fallopian tubes) Menstrual cycle changes Red, brown, or purplish blotches on or under the skin or inside the mouth, nose, or eyelids AIDS, or acquired immunodeficiency syndrome, is the final stage of HIV infection. HIV infection turns to AIDS when you have one or more opportunistic infections, certain cancers, or a very low CD4 cell count.  Syphilis. Syphilis is an STI caused by bacteria. It's passed through direct contact with a syphilis sore during vaginal, anal, or oral sex. Untreated syphilis can infect other parts of the body. It is easily treated with antibiotics. Syphilis is very rare among lesbians. But, you should talk to your doctor if you have any sores that don't heal.  Practice safer sex.  Get tested for STIs before starting a sexual relationship. If you are unsure about a partner's status, practice methods to reduce the chances of sharing vaginal fluid, semen, or blood. If you have sex with men, use a condom every time. You should also use condoms on sex toys. Oral sex with men or with women can also spread STIs, including, rarely, HIV. HIV can potentially be passed through a mucous membrane (such as the mouth) by vaginal fluids or blood, especially if the membrane is torn or cut.

## 2014-01-28 LAB — GC/CHLAMYDIA PROBE AMP
CT Probe RNA: NEGATIVE
GC PROBE AMP APTIMA: NEGATIVE

## 2014-01-28 LAB — HIV ANTIBODY (ROUTINE TESTING W REFLEX): HIV 1&2 Ab, 4th Generation: NONREACTIVE

## 2014-02-01 NOTE — MAU Provider Note (Signed)
Attestation of Attending Supervision of Advanced Practitioner (CNM/NP): Evaluation and management procedures were performed by the Advanced Practitioner under my supervision and collaboration.  I have reviewed the Advanced Practitioner's note and chart, and I agree with the management and plan.  Devlin Brink 02/01/2014 10:34 AM

## 2014-03-02 ENCOUNTER — Encounter (HOSPITAL_COMMUNITY): Payer: Self-pay | Admitting: *Deleted

## 2014-03-20 ENCOUNTER — Emergency Department (HOSPITAL_COMMUNITY)
Admission: EM | Admit: 2014-03-20 | Discharge: 2014-03-20 | Disposition: A | Discharge status: Home / Self Care | Payer: Medicaid Other | Attending: Emergency Medicine | Admitting: Emergency Medicine

## 2014-03-20 ENCOUNTER — Emergency Department (HOSPITAL_COMMUNITY): Payer: Medicaid Other

## 2014-03-20 ENCOUNTER — Encounter (HOSPITAL_COMMUNITY): Payer: Self-pay | Admitting: *Deleted

## 2014-03-20 DIAGNOSIS — Z8679 Personal history of other diseases of the circulatory system: Secondary | ICD-10-CM | POA: Insufficient documentation

## 2014-03-20 DIAGNOSIS — Z8742 Personal history of other diseases of the female genital tract: Secondary | ICD-10-CM | POA: Insufficient documentation

## 2014-03-20 DIAGNOSIS — Z87440 Personal history of urinary (tract) infections: Secondary | ICD-10-CM | POA: Diagnosis not present

## 2014-03-20 DIAGNOSIS — Z720 Tobacco use: Secondary | ICD-10-CM | POA: Diagnosis not present

## 2014-03-20 DIAGNOSIS — R079 Chest pain, unspecified: Secondary | ICD-10-CM | POA: Diagnosis present

## 2014-03-20 DIAGNOSIS — F419 Anxiety disorder, unspecified: Secondary | ICD-10-CM | POA: Insufficient documentation

## 2014-03-20 DIAGNOSIS — J069 Acute upper respiratory infection, unspecified: Secondary | ICD-10-CM | POA: Diagnosis not present

## 2014-03-20 DIAGNOSIS — B9789 Other viral agents as the cause of diseases classified elsewhere: Secondary | ICD-10-CM

## 2014-03-20 DIAGNOSIS — Z79899 Other long term (current) drug therapy: Secondary | ICD-10-CM | POA: Diagnosis not present

## 2014-03-20 DIAGNOSIS — R0789 Other chest pain: Secondary | ICD-10-CM | POA: Diagnosis not present

## 2014-03-20 DIAGNOSIS — F329 Major depressive disorder, single episode, unspecified: Secondary | ICD-10-CM | POA: Diagnosis not present

## 2014-03-20 DIAGNOSIS — Z8619 Personal history of other infectious and parasitic diseases: Secondary | ICD-10-CM | POA: Insufficient documentation

## 2014-03-20 MED ORDER — PREDNISONE 50 MG PO TABS: 50.0000 mg | ORAL_TABLET | Freq: Every day | ORAL | Status: DC

## 2014-03-20 MED ORDER — ALBUTEROL SULFATE HFA 108 (90 BASE) MCG/ACT IN AERS
2.0000 | INHALATION_SPRAY | RESPIRATORY_TRACT | Status: DC | PRN
  Administered 2014-03-20: 2 via RESPIRATORY_TRACT
  Filled 2014-03-20: qty 6.7

## 2014-03-20 MED ORDER — GUAIFENESIN ER 1200 MG PO TB12: 1.0000 | ORAL_TABLET | Freq: Two times a day (BID) | ORAL | Status: DC

## 2014-03-20 MED ORDER — ACETAMINOPHEN-CODEINE 120-12 MG/5ML PO SOLN: 10.0000 mL | ORAL | Status: DC | PRN

## 2014-03-20 MED ORDER — KETOROLAC TROMETHAMINE 60 MG/2ML IM SOLN
60.0000 mg | Freq: Once | INTRAMUSCULAR | Status: AC
  Administered 2014-03-20: 60 mg via INTRAMUSCULAR
  Filled 2014-03-20: qty 2

## 2014-03-20 NOTE — Discharge Instructions (Signed)
Return here as needed. Follow up with your doctor. Increase your fluid intake. Use heat on your chest.

## 2014-03-20 NOTE — ED Provider Notes (Signed)
CSN: [id number]     Arrival date & time 03/20/14  0745 History   First MD Initiated Contact with Patient 03/20/14 [phone]     Chief Complaint  Patient presents with  . Chest Pain  . Cough    HPI:  Robin Horn is a 28 year old African American female with PMH of HTN and GERD who presents to the ED on 03/20/2014 for coughing and chest pain that has been present since Wednesday night. Reports having episodes of chest pain that last 30 minutes to 2 hours and describes the pain as a "pressure". Says the pain is present when she is coughing and says the cough is productive with yellow mucus. The pressure and cough are not associated with activity. She says the chest pain is an 8/10 at its worst but currently rates it as a 5/10. Says the pain is mostly in her left pectoral region. She reports taking Ibuprofen for the chest pain, but says that did not provide any relief. She endorses some shortness of breath when walking and says she had one episode of vomiting yesterday that was brought about due to an extended episode of coughing. She has not noticed a relationship between consuming food and the episodes of her chest pain. She denies any recent changes in physical activity. Denies any fever, chills, weight changes, headaches, dizziness, syncope, sinus tenderness, rhinorrhea, palpitations, radiating pain, PND, orthopnea, nausea, diarrhea, constipation, or urinary changes.   Patient says she had been experiencing heartburn in the months prior to this event and was seen by a GI specialist 2 weeks ago and was started on a PPI at that time. She reports good compliance with her medications. She denies any significant cardiac family history.  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 Has a 6 pack year tobacco history. Reports occasional alcohol use. Denies any recreational drug use.  Past Medical History  Diagnosis Date  . Abnormal Pap smear of cervix   . Gonorrhea   .  Chlamydia   . Herpes   . Pregnant state, incidental   . Migraines   . Depression   . Anxiety   . UTI (lower urinary tract infection)   . Preeclampsia   . Ovarian cyst    Past Surgical History  Procedure Laterality Date  . Colposcopy    . Dilation and curettage of uterus    . Tonsillectomy     Family History  Problem Relation Age of Onset  . Other Neg Hx   . Kidney disease Mother   . Cancer Paternal Grandmother    History  Substance Use Topics  . Smoking status: Current Every Day Smoker -- 0.25 packs/day    Types: Cigarettes, Cigars  . Smokeless tobacco: Never Used  . Alcohol Use: Yes     Comment: social   OB History    Gravida Para Term Preterm AB TAB SAB Ectopic Multiple Living   7 4 4  0 3 3 0 0 0 4     Review of Systems All other systems negative except as documented in the HPI. All pertinent positives and negatives as reviewed in the HPI.   Allergies  Review of patient's allergies indicates no known allergies.  Home Medications   Prior to Admission medications   Medication Sig Start Date End Date Taking? Authorizing Provider  metroNIDAZOLE (FLAGYL) 500 MG tablet Take 4 tablets (2,000 mg total) by mouth once. 01/27/14   Heather Erby Pian, CNM  Multiple Vitamins-Minerals (MULTIVITAMIN  PO) Take 1 tablet by mouth daily.    Historical Provider, MD  QUEtiapine (SEROQUEL) 100 MG tablet Take 100 mg by mouth at bedtime.     Historical Provider, MD  valACYclovir (VALTREX) 500 MG tablet Take 500 mg by mouth 2 (two) times daily as needed (For outbreaks.).     Historical Provider, MD   BP 117/74 mmHg  Pulse 88  Temp(Src) 98 F (36.7 C) (Oral)  Resp 22  Ht 5\' 3"  (1.6 m)  Wt 172 lb (78.019 kg)  BMI 30.48 kg/m2  SpO2 100%  LMP 03/01/2014 Physical Exam  Constitutional: She is oriented to person, place, and time. She appears well-developed and well-nourished. No distress.  HENT:  Head: Normocephalic and atraumatic.  Nose: Nose normal.  Mouth/Throat: Oropharynx is  clear and moist. No oropharyngeal exudate.  Eyes: Conjunctivae are normal. Pupils are equal, round, and reactive to light.  Neck: Normal range of motion. Neck supple. No JVD present.  Cardiovascular: Normal rate, regular rhythm, normal heart sounds and intact distal pulses.  Exam reveals no gallop and no friction rub.   No murmur heard. Pulmonary/Chest: Effort normal and breath sounds normal. No stridor. No respiratory distress. She has no wheezes. She has no rales. She exhibits no tenderness.  Abdominal: Soft. Bowel sounds are normal. She exhibits no distension and no mass. There is no tenderness. There is no rebound and no guarding.  Lymphadenopathy:    She has no cervical adenopathy.  Neurological: She is alert and oriented to person, place, and time.  Skin: Skin is warm and dry. No rash noted. She is not diaphoretic. No erythema. No pallor.  Psychiatric: She has a normal mood and affect. Her behavior is normal.  Nursing note and vitals reviewed.   ED Course  Procedures (including critical care time) Labs Review Labs Reviewed - No data to display  Imaging Review Dg Chest 2 View  03/20/2014   CLINICAL DATA:  Chest pain and cough.  EXAM: CHEST  2 VIEW  COMPARISON:  None.  FINDINGS: The heart size and mediastinal contours are within normal limits. Both lungs are clear. The visualized skeletal structures are unremarkable.  IMPRESSION: Normal chest.   Electronically Signed   By: Rozetta Nunnery M.D.   On: 03/20/2014 08:59     EKG Interpretation   Date/Time:  Friday March 20 2014 07:47:11 EST Ventricular Rate:  98 PR Interval:  128 QRS Duration: 80 QT Interval:  340 QTC Calculation: 434 R Axis:   89 Text Interpretation:  Normal sinus rhythm Nonspecific T wave abnormality  No previous tracing Confirmed by Maryan Rued  MD, WHITNEY ([id number]) on  03/20/2014 8:04:05 AM      MDM   Final diagnoses:  Chest wall pain  Viral URI with cough   patient retreated for chest wall pain and  viral URI with cough.  Told to return here as needed.  Told to increase her fluid intake, rest as much as possible.  Patient agrees the plan.  All questions were answered    Brent General, PA-C 03/24/14 0040  Blanchie Dessert, MD 03/29/14 2259

## 2014-03-20 NOTE — ED Notes (Signed)
Pt reports onset yesterday of left side chest tightness. Having productive cough and sob. Airway intact, ekg done at triage.

## 2014-04-22 ENCOUNTER — Emergency Department (HOSPITAL_COMMUNITY)
Admission: EM | Admit: 2014-04-22 | Discharge: 2014-04-22 | Disposition: A | Discharge status: Home / Self Care | Payer: Medicaid Other | Attending: Emergency Medicine | Admitting: Emergency Medicine

## 2014-04-22 ENCOUNTER — Encounter (HOSPITAL_COMMUNITY): Payer: Self-pay | Admitting: Emergency Medicine

## 2014-04-22 DIAGNOSIS — Z87440 Personal history of urinary (tract) infections: Secondary | ICD-10-CM | POA: Insufficient documentation

## 2014-04-22 DIAGNOSIS — Z8742 Personal history of other diseases of the female genital tract: Secondary | ICD-10-CM | POA: Diagnosis not present

## 2014-04-22 DIAGNOSIS — F419 Anxiety disorder, unspecified: Secondary | ICD-10-CM | POA: Insufficient documentation

## 2014-04-22 DIAGNOSIS — Z720 Tobacco use: Secondary | ICD-10-CM | POA: Insufficient documentation

## 2014-04-22 DIAGNOSIS — F329 Major depressive disorder, single episode, unspecified: Secondary | ICD-10-CM | POA: Insufficient documentation

## 2014-04-22 DIAGNOSIS — Z8619 Personal history of other infectious and parasitic diseases: Secondary | ICD-10-CM | POA: Insufficient documentation

## 2014-04-22 DIAGNOSIS — Z7952 Long term (current) use of systemic steroids: Secondary | ICD-10-CM | POA: Diagnosis not present

## 2014-04-22 DIAGNOSIS — Z79899 Other long term (current) drug therapy: Secondary | ICD-10-CM | POA: Insufficient documentation

## 2014-04-22 DIAGNOSIS — K088 Other specified disorders of teeth and supporting structures: Secondary | ICD-10-CM | POA: Diagnosis not present

## 2014-04-22 DIAGNOSIS — K0889 Other specified disorders of teeth and supporting structures: Secondary | ICD-10-CM

## 2014-04-22 DIAGNOSIS — G43909 Migraine, unspecified, not intractable, without status migrainosus: Secondary | ICD-10-CM | POA: Insufficient documentation

## 2014-04-22 MED ORDER — HYDROCODONE-ACETAMINOPHEN 5-325 MG PO TABS: 1.0000 | ORAL_TABLET | Freq: Four times a day (QID) | ORAL | Status: DC | PRN

## 2014-04-22 MED ORDER — PENICILLIN V POTASSIUM 250 MG PO TABS
500.0000 mg | ORAL_TABLET | Freq: Once | ORAL | Status: AC
  Administered 2014-04-22: 500 mg via ORAL
  Filled 2014-04-22: qty 2

## 2014-04-22 MED ORDER — HYDROCODONE-ACETAMINOPHEN 5-325 MG PO TABS
1.0000 | ORAL_TABLET | Freq: Once | ORAL | Status: AC
  Administered 2014-04-22: 1 via ORAL
  Filled 2014-04-22: qty 1

## 2014-04-22 MED ORDER — PENICILLIN V POTASSIUM 500 MG PO TABS: 500.0000 mg | ORAL_TABLET | Freq: Three times a day (TID) | ORAL | Status: DC

## 2014-04-22 NOTE — ED Notes (Signed)
Pt states she started having right upper mouth pain 2 days ago, states she was eating something "hard" and bit into it and that's when the pain started. Mucosa pink and moist. Pt reports pressure relieving the pain. Right side of face unswollen. Right side of upper gums appear swollen.

## 2014-04-22 NOTE — Discharge Instructions (Signed)

## 2014-04-22 NOTE — ED Notes (Signed)
MD at bedside. 

## 2014-04-22 NOTE — ED Notes (Signed)
Pt states she has a hx of anxiety attacks and is feeling anxious right now. Coupled with her pain, possibly a reason for her high heart rate?

## 2014-04-22 NOTE — ED Provider Notes (Signed)
CSN: [id number]     Arrival date & time 04/22/14  4696 History   First MD Initiated Contact with Patient 04/22/14 [phone]     No chief complaint on file.    (Consider location/radiation/quality/duration/timing/severity/associated sxs/prior Treatment) HPI   28 year old female who presents complaining of mouth pain. Patient states 2 days ago she began something hard and since then she has had pain to her right upper teeth. She described pain as a sharp throbbing sensation, worsening with chewing, and with cold air. States she has had similar pain to the same tooth in the past improved after having a dental fill-in.  She has tried taking ibuprofen, and gargle with saltwater with minimal relief. She denies any fever, severe headache, ear pain, trouble swallowing, or rash. Her heart rate is fast on the monitor however patient states that she takes Seroquel and sometimes it can cause her heart to race temporarily. At this time she denies any lightheadedness, dizziness, chest pain or shortness of breath.  Past Medical History  Diagnosis Date  . Abnormal Pap smear of cervix   . Gonorrhea   . Chlamydia   . Herpes   . Pregnant state, incidental   . Migraines   . Depression   . Anxiety   . UTI (lower urinary tract infection)   . Preeclampsia   . Ovarian cyst    Past Surgical History  Procedure Laterality Date  . Colposcopy    . Dilation and curettage of uterus    . Tonsillectomy     Family History  Problem Relation Age of Onset  . Other Neg Hx   . Kidney disease Mother   . Cancer Paternal Grandmother    History  Substance Use Topics  . Smoking status: Current Every Day Smoker -- 0.50 packs/day    Types: Cigarettes, Cigars  . Smokeless tobacco: Never Used  . Alcohol Use: Yes     Comment: occasional   OB History    Gravida Para Term Preterm AB TAB SAB Ectopic Multiple Living   7 4 4  0 3 3 0 0 0 4     Review of Systems  Constitutional: Negative for fever.  HENT: Positive for  dental problem.   Skin: Negative for rash and wound.      Allergies  Review of patient's allergies indicates no known allergies.  Home Medications   Prior to Admission medications   Medication Sig Start Date End Date Taking? Authorizing Provider  acetaminophen-codeine 120-12 MG/5ML solution Take 10 mLs by mouth every 4 (four) hours as needed for moderate pain. 03/20/14   Resa Miner Lawyer, PA-C  Guaifenesin 1200 MG TB12 Take 1 tablet (1,200 mg total) by mouth 2 (two) times daily. 03/20/14   Resa Miner Lawyer, PA-C  ibuprofen (ADVIL,MOTRIN) 200 MG tablet Take 800 mg by mouth every 6 (six) hours as needed (for pain).    Historical Provider, MD  LATUDA 20 MG TABS Take 1 tablet by mouth daily. 01/30/14   Historical Provider, MD  lisinopril-hydrochlorothiazide (PRINZIDE,ZESTORETIC) 10-12.5 MG per tablet Take 1 tablet by mouth daily. 01/30/14   Historical Provider, MD  metroNIDAZOLE (FLAGYL) 500 MG tablet Take 4 tablets (2,000 mg total) by mouth once. Patient not taking: Reported on 03/20/2014 01/27/14   Mathis Bud, CNM  predniSONE (DELTASONE) 50 MG tablet Take 1 tablet (50 mg total) by mouth daily. 03/20/14   Harvey, PA-C  QUEtiapine (SEROQUEL) 300 MG tablet Take 150-300 mg by mouth 2 (two) times daily. Take 150 mg every  morning and 300 mg at bedtime    Historical Provider, MD   BP 118/83 mmHg  Pulse 130  Temp(Src) 98 F (36.7 C) (Oral)  Resp 18  SpO2 100% Physical Exam  Constitutional: She appears well-developed and well-nourished. No distress.  HENT:  Head: Atraumatic.  Patient has normal dentition without any significant dental decay. Tenderness noted to tooth #3 on palpation but normal gum line, no obvious abscess, no dental intrusion or extrusion. No dental avulsion.  No trismus noted.  On ears exam, normal TMs bilaterally. No pain at TMJ.  Eyes: Conjunctivae are normal.  Neck: Neck supple. No JVD present.  Cardiovascular:  Tachycardia without  murmurs rubs or gallops  Pulmonary/Chest: Effort normal and breath sounds normal.  Abdominal: Soft. There is no tenderness.  Neurological: She is alert.  Skin: No rash noted.  Psychiatric: She has a normal mood and affect.  Nursing note and vitals reviewed.   ED Course  Procedures (including critical care time)  Patient presents complaining of dental pain. No obvious dental decay or abscess noted.  She is also tachycardic, likely secondary to pain related to the medication. However she is in no respiratory discomfort and is not complaining of any shortness of breath. Will give patient pain medication and antibiotic. Patient will follow-up closely with dentist for further care.  8:38 AM Heart rate improves after pain is well controlled. EKG shows mild sinus tachycardia but no arrhythmia. Patient at this time stable for discharge with close follow-up.  Labs Review Labs Reviewed - No data to display  Imaging Review No results found.   EKG Interpretation None      Date: 04/22/2014  Rate: 105  Rhythm: normal sinus rhythm  QRS Axis: normal  Intervals: normal  ST/T Wave abnormalities: normal  Conduction Disutrbances: none  Narrative Interpretation:   Old EKG Reviewed: No significant changes noted     MDM   Final diagnoses:  Pain, dental    BP 117/76 mmHg  Pulse 105  Temp(Src) 98.8 F (37.1 C) (Oral)  Resp 14  Ht 5\' 3"  (1.6 m)  Wt 171 lb (77.565 kg)  BMI 30.30 kg/m2  SpO2 100%  LMP 03/31/2014 (Approximate)     Domenic Moras, PA-C 04/22/14 [place], MD 04/22/14 [phone]

## 2014-04-22 NOTE — ED Notes (Signed)
Pt pulse 120 at this time according to monitor, checked radial pulse and 120 is accurate. Pt in no apparent distress. Resting, watching television with children at bedside.

## 2014-06-24 ENCOUNTER — Emergency Department (HOSPITAL_COMMUNITY)
Admission: EM | Admit: 2014-06-24 | Discharge: 2014-06-24 | Disposition: A | Discharge status: Home / Self Care | Payer: Medicaid Other | Attending: Emergency Medicine | Admitting: Emergency Medicine

## 2014-06-24 ENCOUNTER — Encounter (HOSPITAL_COMMUNITY): Payer: Self-pay

## 2014-06-24 ENCOUNTER — Emergency Department (HOSPITAL_COMMUNITY): Payer: Medicaid Other

## 2014-06-24 DIAGNOSIS — F419 Anxiety disorder, unspecified: Secondary | ICD-10-CM | POA: Diagnosis not present

## 2014-06-24 DIAGNOSIS — Z90710 Acquired absence of both cervix and uterus: Secondary | ICD-10-CM | POA: Insufficient documentation

## 2014-06-24 DIAGNOSIS — Z3202 Encounter for pregnancy test, result negative: Secondary | ICD-10-CM | POA: Diagnosis not present

## 2014-06-24 DIAGNOSIS — F329 Major depressive disorder, single episode, unspecified: Secondary | ICD-10-CM | POA: Diagnosis not present

## 2014-06-24 DIAGNOSIS — N760 Acute vaginitis: Secondary | ICD-10-CM | POA: Insufficient documentation

## 2014-06-24 DIAGNOSIS — Z8619 Personal history of other infectious and parasitic diseases: Secondary | ICD-10-CM | POA: Diagnosis not present

## 2014-06-24 DIAGNOSIS — Z720 Tobacco use: Secondary | ICD-10-CM | POA: Diagnosis not present

## 2014-06-24 DIAGNOSIS — Z79899 Other long term (current) drug therapy: Secondary | ICD-10-CM | POA: Diagnosis not present

## 2014-06-24 DIAGNOSIS — Z87440 Personal history of urinary (tract) infections: Secondary | ICD-10-CM | POA: Insufficient documentation

## 2014-06-24 DIAGNOSIS — R059 Cough, unspecified: Secondary | ICD-10-CM

## 2014-06-24 DIAGNOSIS — R05 Cough: Secondary | ICD-10-CM

## 2014-06-24 DIAGNOSIS — G43909 Migraine, unspecified, not intractable, without status migrainosus: Secondary | ICD-10-CM | POA: Diagnosis not present

## 2014-06-24 DIAGNOSIS — R109 Unspecified abdominal pain: Secondary | ICD-10-CM | POA: Diagnosis present

## 2014-06-24 DIAGNOSIS — B9689 Other specified bacterial agents as the cause of diseases classified elsewhere: Secondary | ICD-10-CM

## 2014-06-24 LAB — URINALYSIS, ROUTINE W REFLEX MICROSCOPIC
Bilirubin Urine: NEGATIVE
GLUCOSE, UA: NEGATIVE mg/dL
HGB URINE DIPSTICK: NEGATIVE
Ketones, ur: NEGATIVE mg/dL
LEUKOCYTES UA: NEGATIVE
Nitrite: NEGATIVE
PH: 5.5 (ref 5.0–8.0)
Protein, ur: NEGATIVE mg/dL
Specific Gravity, Urine: 1.008 (ref 1.005–1.030)
Urobilinogen, UA: 0.2 mg/dL (ref 0.0–1.0)

## 2014-06-24 LAB — I-STAT CHEM 8, ED
BUN: 9 mg/dL (ref 6–23)
Calcium, Ion: 1.2 mmol/L (ref 1.12–1.23)
Chloride: 102 mmol/L (ref 96–112)
Creatinine, Ser: 1.2 mg/dL — ABNORMAL HIGH (ref 0.50–1.10)
Glucose, Bld: 103 mg/dL — ABNORMAL HIGH (ref 70–99)
HCT: 38 % (ref 36.0–46.0)
HEMOGLOBIN: 12.9 g/dL (ref 12.0–15.0)
Potassium: 3.6 mmol/L (ref 3.5–5.1)
SODIUM: 139 mmol/L (ref 135–145)
TCO2: 22 mmol/L (ref 0–100)

## 2014-06-24 LAB — WET PREP, GENITAL
Trich, Wet Prep: NONE SEEN
Yeast Wet Prep HPF POC: NONE SEEN

## 2014-06-24 LAB — POC URINE PREG, ED: Preg Test, Ur: NEGATIVE

## 2014-06-24 LAB — RAPID STREP SCREEN (MED CTR MEBANE ONLY): STREPTOCOCCUS, GROUP A SCREEN (DIRECT): NEGATIVE

## 2014-06-24 MED ORDER — TRAMADOL HCL 50 MG PO TABS: 50.0000 mg | ORAL_TABLET | Freq: Four times a day (QID) | ORAL | Status: DC | PRN

## 2014-06-24 MED ORDER — ONDANSETRON 4 MG PO TBDP
4.0000 mg | ORAL_TABLET | Freq: Once | ORAL | Status: AC
  Administered 2014-06-24: 4 mg via ORAL
  Filled 2014-06-24: qty 1

## 2014-06-24 MED ORDER — CEFTRIAXONE SODIUM 250 MG IJ SOLR
250.0000 mg | Freq: Once | INTRAMUSCULAR | Status: AC
  Administered 2014-06-24: 250 mg via INTRAMUSCULAR
  Filled 2014-06-24: qty 250

## 2014-06-24 MED ORDER — LIDOCAINE HCL 2 % IJ SOLN
20.0000 mL | Freq: Once | INTRAMUSCULAR | Status: AC
  Administered 2014-06-24: 400 mg
  Filled 2014-06-24: qty 20

## 2014-06-24 MED ORDER — METRONIDAZOLE 500 MG PO TABS: 500.0000 mg | ORAL_TABLET | Freq: Two times a day (BID) | ORAL | Status: DC

## 2014-06-24 MED ORDER — OXYCODONE-ACETAMINOPHEN 5-325 MG PO TABS
1.0000 | ORAL_TABLET | Freq: Once | ORAL | Status: AC
  Administered 2014-06-24: 1 via ORAL
  Filled 2014-06-24: qty 1

## 2014-06-24 MED ORDER — METRONIDAZOLE 500 MG PO TABS
2000.0000 mg | ORAL_TABLET | Freq: Once | ORAL | Status: AC
  Administered 2014-06-24: 2000 mg via ORAL
  Filled 2014-06-24: qty 4

## 2014-06-24 MED ORDER — AZITHROMYCIN 250 MG PO TABS
1000.0000 mg | ORAL_TABLET | Freq: Once | ORAL | Status: AC
  Administered 2014-06-24: 1000 mg via ORAL
  Filled 2014-06-24: qty 4

## 2014-06-24 NOTE — Discharge Instructions (Signed)
Bacterial Vaginosis Bacterial vaginosis is a vaginal infection that occurs when the normal balance of bacteria in the vagina is disrupted. It results from an overgrowth of certain bacteria. This is the most common vaginal infection in women of childbearing age. Treatment is important to prevent complications, especially in pregnant women, as it can cause a premature delivery. CAUSES  Bacterial vaginosis is caused by an increase in harmful bacteria that are normally present in smaller amounts in the vagina. Several different kinds of bacteria can cause bacterial vaginosis. However, the reason that the condition develops is not fully understood. RISK FACTORS Certain activities or behaviors can put you at an increased risk of developing bacterial vaginosis, including:  Having a new sex partner or multiple sex partners.  Douching.  Using an intrauterine device (IUD) for contraception. Women do not get bacterial vaginosis from toilet seats, bedding, swimming pools, or contact with objects around them. SIGNS AND SYMPTOMS  Some women with bacterial vaginosis have no signs or symptoms. Common symptoms include:  Grey vaginal discharge.  A fishlike odor with discharge, especially after sexual intercourse.  Itching or burning of the vagina and vulva.  Burning or pain with urination. DIAGNOSIS  Your health care provider will take a medical history and examine the vagina for signs of bacterial vaginosis. A sample of vaginal fluid may be taken. Your health care provider will look at this sample under a microscope to check for bacteria and abnormal cells. A vaginal pH test may also be done.  TREATMENT  Bacterial vaginosis may be treated with antibiotic medicines. These may be given in the form of a pill or a vaginal cream. A second round of antibiotics may be prescribed if the condition comes back after treatment.  HOME CARE INSTRUCTIONS   Only take over-the-counter or prescription medicines as  directed by your health care provider.  If antibiotic medicine was prescribed, take it as directed. Make sure you finish it even if you start to feel better.  Do not have sex until treatment is completed.  Tell all sexual partners that you have a vaginal infection. They should see their health care provider and be treated if they have problems, such as a mild rash or itching.  Practice safe sex by using condoms and only having one sex partner. SEEK MEDICAL CARE IF:   Your symptoms are not improving after 3 days of treatment.  You have increased discharge or pain.  You have a fever. MAKE SURE YOU:   Understand these instructions.  Will watch your condition.  Will get help right away if you are not doing well or get worse. FOR MORE INFORMATION  Centers for Disease Control and Prevention, Division of STD Prevention: www.cdc.gov/std American Sexual Health Association (ASHA): www.ashastd.org  Document Released: 04/17/2005 Document Revised: 02/05/2013 Document Reviewed: 11/27/2012 [employer] Patient Information 2015 [employer]. This information is not intended to replace advice given to you by your health care provider. Make sure you discuss any questions you have with your health care provider.  

## 2014-06-24 NOTE — ED Notes (Signed)
Pt c/o abdominal pain and sore throat x1 day. Pt states she has been nauseous, a few episodes of diarrhea; Denies vomiting. Pt also states she has been having some tightness in chest since last night. Pt states worst pain is in throat. Reports having fever last night.

## 2014-06-24 NOTE — ED Provider Notes (Signed)
CSN: [id number]     Arrival date & time 06/24/14  1572 History   First MD Initiated Contact with Patient 06/24/14 0848     Chief Complaint  Patient presents with  . Abdominal Pain     (Consider location/radiation/quality/duration/timing/severity/associated sxs/prior Treatment) HPI    PCP: [facility] Blood pressure 122/72, pulse 106, temperature 98.1 F (36.7 C), temperature source Oral, resp. rate 17, last menstrual period 05/24/2014, SpO2 100 %.  Robin Horn is a 29 y.o.female with a significant PMH of gonorrhea, chlamydia, herpes, migraines, depression, anxiety, UTI, preeclampsia, ovarian cyst presents to the ER with complaints of abdominal pain, nausea, and sore throat. She reports having a cold over the past two weeks with a mild, non productive cough. She reports waking up this morning with sore throat that resolved with a [employer]. She also complains of frequent yeast infection and finished a dose of Diflucan. Her PCP prescribes these fore her to take as needed due to frequency of yeast. She declines having symptoms consistent with her yeast infections but is having pelvic discomfort and notes an "odor" to her vagina. She has not had any discharge. One episode of loose stool this morning.  Negative Review of Symptoms: headaches, fevers, neck pain, ear pain, confusion, back pain, dysuria, vaginal bleeding, bloating   Past Medical History  Diagnosis Date  . Abnormal Pap smear of cervix   . Gonorrhea   . Chlamydia   . Herpes   . Pregnant state, incidental   . Migraines   . Depression   . Anxiety   . UTI (lower urinary tract infection)   . Preeclampsia   . Ovarian cyst    Past Surgical History  Procedure Laterality Date  . Colposcopy    . Dilation and curettage of uterus    . Tonsillectomy     Family History  Problem Relation Age of Onset  . Other Neg Hx   . Kidney disease Mother   . Cancer Paternal Grandmother    History  Substance Use Topics   . Smoking status: Current Every Day Smoker -- 0.50 packs/day    Types: Cigarettes, Cigars  . Smokeless tobacco: Never Used  . Alcohol Use: Yes     Comment: occasional   OB History    Gravida Para Term Preterm AB TAB SAB Ectopic Multiple Living   7 4 4  0 3 3 0 0 0 4     Review of Systems  10 Systems reviewed and are negative for acute change except as noted in the HPI.    Allergies  Review of patient's allergies indicates no known allergies.  Home Medications   Prior to Admission medications   Medication Sig Start Date End Date Taking? Authorizing Provider  albuterol (PROVENTIL HFA;VENTOLIN HFA) 108 (90 BASE) MCG/ACT inhaler Inhale 1-2 puffs into the lungs every 6 (six) hours as needed for wheezing or shortness of breath.   Yes Historical Provider, MD  Aspirin-Acetaminophen-Caffeine (GOODY HEADACHE PO) Take 1 packet by mouth as needed (pain).   Yes Historical Provider, MD  lisinopril-hydrochlorothiazide (PRINZIDE,ZESTORETIC) 10-12.5 MG per tablet Take 1 tablet by mouth daily. 01/30/14  Yes Historical Provider, MD  omeprazole (PRILOSEC) 20 MG capsule Take 20 mg by mouth daily.  04/17/14  Yes Historical Provider, MD  QUEtiapine (SEROQUEL) 300 MG tablet Take 150-300 mg by mouth 2 (two) times daily. Take 150 mg every morning and 300 mg at bedtime   Yes Historical Provider, MD  acetaminophen-codeine 120-12 MG/5ML solution Take 10 mLs  by mouth every 4 (four) hours as needed for moderate pain. Patient not taking: Reported on 04/22/2014 03/20/14   Resa Miner Lawyer, PA-C  Guaifenesin 1200 MG TB12 Take 1 tablet (1,200 mg total) by mouth 2 (two) times daily. Patient not taking: Reported on 04/22/2014 03/20/14   Resa Miner Lawyer, PA-C  HYDROcodone-acetaminophen (NORCO/VICODIN) 5-325 MG per tablet Take 1 tablet by mouth every 6 (six) hours as needed for moderate pain. Patient not taking: Reported on 06/24/2014 04/22/14   Domenic Moras, PA-C  ibuprofen (ADVIL,MOTRIN) 200 MG tablet Take 800 mg  by mouth every 6 (six) hours as needed (for pain).    Historical Provider, MD  LATUDA 20 MG TABS Take 1 tablet by mouth daily. 01/30/14   Historical Provider, MD  metroNIDAZOLE (FLAGYL) 500 MG tablet Take 4 tablets (2,000 mg total) by mouth once. Patient not taking: Reported on 03/20/2014 01/27/14   Mathis Bud, CNM  metroNIDAZOLE (FLAGYL) 500 MG tablet Take 1 tablet (500 mg total) by mouth 2 (two) times daily. 06/24/14   Linus Mako, PA-C  penicillin v potassium (VEETID) 500 MG tablet Take 1 tablet (500 mg total) by mouth 3 (three) times daily. Patient not taking: Reported on 06/24/2014 04/22/14   Domenic Moras, PA-C  predniSONE (DELTASONE) 50 MG tablet Take 1 tablet (50 mg total) by mouth daily. Patient not taking: Reported on 04/22/2014 03/20/14   Resa Miner Lawyer, PA-C  traMADol (ULTRAM) 50 MG tablet Take 1 tablet (50 mg total) by mouth every 6 (six) hours as needed. 06/24/14   Tahji  Marilu Favre, PA-C   BP 124/72 mmHg  Pulse 98  Temp(Src) 98.1 F (36.7 C) (Oral)  Resp 16  SpO2 100%  LMP 05/24/2014 Physical Exam  Constitutional: She appears well-developed and well-nourished. No distress.  HENT:  Head: Normocephalic and atraumatic.  Right Ear: Tympanic membrane and ear canal normal.  Left Ear: Tympanic membrane and ear canal normal.  Nose: Nose normal. Right sinus exhibits no maxillary sinus tenderness and no frontal sinus tenderness. Left sinus exhibits no maxillary sinus tenderness and no frontal sinus tenderness.  Mouth/Throat: Uvula is midline, oropharynx is clear and moist and mucous membranes are normal.  Eyes: Pupils are equal, round, and reactive to light.  Neck: Normal range of motion. Neck supple. No spinous process tenderness and no muscular tenderness present. Normal range of motion present.  Cardiovascular: Normal rate and regular rhythm.   Pulmonary/Chest: Effort normal and breath sounds normal.  Abdominal: Soft. Bowel sounds are normal. She exhibits no  distension. There is tenderness in the suprapubic area. There is no rebound, no guarding and no CVA tenderness.  Genitourinary: Uterus normal. There is no rash or tenderness on the right labia. There is no rash or tenderness on the left labia. Cervix exhibits no motion tenderness, no discharge and no friability. Right adnexum displays no mass, no tenderness and no fullness. Left adnexum displays no mass, no tenderness and no fullness. There is tenderness (mild discomfort) in the vagina. No bleeding in the vagina. No foreign body around the vagina. Vaginal discharge (white) found.  Neurological: She is alert. She has normal strength.  Skin: Skin is warm and dry. No rash noted.  Psychiatric: She has a normal mood and affect.  Nursing note and vitals reviewed.   ED Course  Procedures (including critical care time) Labs Review Labs Reviewed  WET PREP, GENITAL - Abnormal; Notable for the following:    Clue Cells Wet Prep HPF POC TOO NUMEROUS TO COUNT (*)  WBC, Wet Prep HPF POC TOO NUMEROUS TO COUNT (*)    All other components within normal limits  URINALYSIS, ROUTINE W REFLEX MICROSCOPIC - Abnormal; Notable for the following:    APPearance HAZY (*)    All other components within normal limits  I-STAT CHEM 8, ED - Abnormal; Notable for the following:    Creatinine, Ser 1.20 (*)    Glucose, Bld 103 (*)    All other components within normal limits  RAPID STREP SCREEN  CULTURE, GROUP A STREP  POC URINE PREG, ED  GC/CHLAMYDIA PROBE AMP ([place])    Imaging Review Dg Chest 2 View  06/24/2014   CLINICAL DATA:  29 year old female with cough for 2 weeks, became productive 2 days ago and fever today. Initial encounter.  EXAM: CHEST  2 VIEW  COMPARISON:  03/20/2014.  FINDINGS: Stable somewhat low lung volumes. Normal cardiac size and mediastinal contours. Visualized tracheal air column is within normal limits. No pneumothorax, pulmonary edema, pleural effusion or consolidation. No confluent  pulmonary opacity. Mild scoliosis. No acute osseous abnormality identified.  IMPRESSION: Mildly low lung volumes.  No acute cardiopulmonary abnormality.   Electronically Signed   By: Genevie Ann M.D.   On: 06/24/2014 10:11     EKG Interpretation None      MDM   Final diagnoses:  Cough  Bacterial vaginosis   The wet prep shows MANY clue cells and MANY WBC's.  Patient is afebrile, non toxic appearing- labs and vitals not suggestive of PID. Her strep screen is negative. She also is no longer having sore throat which was brief. She has negative urinalysis, poc preg, i-stat chem 8 and chest xray are unremarkable.  Medications  oxyCODONE-acetaminophen (PERCOCET/ROXICET) 5-325 MG per tablet 1 tablet (1 tablet Oral Given 06/24/14 1030)  ondansetron (ZOFRAN-ODT) disintegrating tablet 4 mg (4 mg Oral Given 06/24/14 1029)  ondansetron (ZOFRAN-ODT) disintegrating tablet 4 mg (4 mg Oral Given 06/24/14 1226)  metroNIDAZOLE (FLAGYL) tablet 2,000 mg (2,000 mg Oral Given 06/24/14 1227)  cefTRIAXone (ROCEPHIN) injection 250 mg (250 mg Intramuscular Given 06/24/14 1231)  azithromycin (ZITHROMAX) tablet 1,000 mg (1,000 mg Oral Given 06/24/14 1227)  lidocaine (XYLOCAINE) 2 % (with pres) injection 400 mg (400 mg Other Given 06/24/14 1232)   Advised to practice safe sex and have all partners evaluated and treated at the local health department. Also advised to follow with the health Department in 1-2 weeks to confirm effectiveness of treatment and receive additional education/evaluation. Return precautions given.  29 y.o.Robin Horn's evaluation in the Emergency Department is complete. It has been determined that no acute conditions requiring further emergency intervention are present at this time. The patient/guardian have been advised of the diagnosis and plan. We have discussed signs and symptoms that warrant return to the ED, such as changes or worsening in symptoms.  Vital signs are stable at discharge. Filed  Vitals:   06/24/14 1228  BP: 124/72  Pulse: 98  Temp:   Resp: 16    Patient/guardian has voiced understanding and agreed to follow-up with the PCP or specialist.   Linus Mako, PA-C 06/24/14 1251  Wandra Arthurs, MD 06/25/14 1236

## 2014-06-25 LAB — GC/CHLAMYDIA PROBE AMP (~~LOC~~) NOT AT ARMC
CHLAMYDIA, DNA PROBE: NEGATIVE
Neisseria Gonorrhea: NEGATIVE

## 2014-06-27 LAB — CULTURE, GROUP A STREP: STREP A CULTURE: NEGATIVE

## 2014-07-08 ENCOUNTER — Emergency Department (HOSPITAL_COMMUNITY)
Admission: EM | Admit: 2014-07-08 | Discharge: 2014-07-08 | Discharge status: Against Medical Advice | Payer: Medicaid Other | Attending: Emergency Medicine | Admitting: Emergency Medicine

## 2014-07-08 ENCOUNTER — Encounter (HOSPITAL_COMMUNITY): Payer: Self-pay | Admitting: Emergency Medicine

## 2014-07-08 ENCOUNTER — Encounter (HOSPITAL_COMMUNITY): Payer: Self-pay | Admitting: *Deleted

## 2014-07-08 ENCOUNTER — Emergency Department (INDEPENDENT_AMBULATORY_CARE_PROVIDER_SITE_OTHER)
Admission: EM | Admit: 2014-07-08 | Discharge: 2014-07-08 | Disposition: A | Discharge status: Home / Self Care | Payer: Medicaid Other | Source: Home / Self Care

## 2014-07-08 DIAGNOSIS — Z720 Tobacco use: Secondary | ICD-10-CM | POA: Insufficient documentation

## 2014-07-08 DIAGNOSIS — R197 Diarrhea, unspecified: Secondary | ICD-10-CM | POA: Insufficient documentation

## 2014-07-08 DIAGNOSIS — R1110 Vomiting, unspecified: Secondary | ICD-10-CM | POA: Insufficient documentation

## 2014-07-08 DIAGNOSIS — R52 Pain, unspecified: Secondary | ICD-10-CM | POA: Insufficient documentation

## 2014-07-08 DIAGNOSIS — K529 Noninfective gastroenteritis and colitis, unspecified: Secondary | ICD-10-CM | POA: Diagnosis not present

## 2014-07-08 LAB — COMPREHENSIVE METABOLIC PANEL
ALBUMIN: 3.8 g/dL (ref 3.5–5.2)
ALK PHOS: 48 U/L (ref 39–117)
ALT: 22 U/L (ref 0–35)
ANION GAP: 7 (ref 5–15)
AST: 26 U/L (ref 0–37)
BILIRUBIN TOTAL: 0.5 mg/dL (ref 0.3–1.2)
BUN: 11 mg/dL (ref 6–23)
CO2: 27 mmol/L (ref 19–32)
Calcium: 9 mg/dL (ref 8.4–10.5)
Chloride: 105 mmol/L (ref 96–112)
Creatinine, Ser: 1.34 mg/dL — ABNORMAL HIGH (ref 0.50–1.10)
GFR calc non Af Amer: 54 mL/min — ABNORMAL LOW (ref >90)
GFR, EST AFRICAN AMERICAN: 62 mL/min — AB (ref >90)
Glucose, Bld: 116 mg/dL — ABNORMAL HIGH (ref 70–99)
Potassium: 3.8 mmol/L (ref 3.5–5.1)
Sodium: 139 mmol/L (ref 135–145)
Total Protein: 7.1 g/dL (ref 6.0–8.3)

## 2014-07-08 LAB — CBC WITH DIFFERENTIAL/PLATELET
BASOS ABS: 0 10*3/uL (ref 0.0–0.1)
Basophils Relative: 0 % (ref 0–1)
EOS ABS: 0 10*3/uL (ref 0.0–0.7)
EOS PCT: 1 % (ref 0–5)
HCT: 38.2 % (ref 36.0–46.0)
HEMOGLOBIN: 12.5 g/dL (ref 12.0–15.0)
Lymphocytes Relative: 7 % — ABNORMAL LOW (ref 12–46)
Lymphs Abs: 0.6 10*3/uL — ABNORMAL LOW (ref 0.7–4.0)
MCH: 28.4 pg (ref 26.0–34.0)
MCHC: 32.7 g/dL (ref 30.0–36.0)
MCV: 86.8 fL (ref 78.0–100.0)
MONO ABS: 0.3 10*3/uL (ref 0.1–1.0)
Monocytes Relative: 4 % (ref 3–12)
NEUTROS PCT: 88 % — AB (ref 43–77)
Neutro Abs: 6.7 10*3/uL (ref 1.7–7.7)
Platelets: 279 10*3/uL (ref 150–400)
RBC: 4.4 MIL/uL (ref 3.87–5.11)
RDW: 14 % (ref 11.5–15.5)
WBC: 7.6 10*3/uL (ref 4.0–10.5)

## 2014-07-08 LAB — POCT URINALYSIS DIP (DEVICE)
Bilirubin Urine: NEGATIVE
Glucose, UA: NEGATIVE mg/dL
Hgb urine dipstick: NEGATIVE
KETONES UR: NEGATIVE mg/dL
Leukocytes, UA: NEGATIVE
NITRITE: NEGATIVE
Protein, ur: 30 mg/dL — AB
Specific Gravity, Urine: 1.025 (ref 1.005–1.030)
Urobilinogen, UA: 0.2 mg/dL (ref 0.0–1.0)
pH: 6.5 (ref 5.0–8.0)

## 2014-07-08 LAB — POCT PREGNANCY, URINE: PREG TEST UR: NEGATIVE

## 2014-07-08 MED ORDER — ACETAMINOPHEN 325 MG PO TABS
ORAL_TABLET | ORAL | Status: AC
  Filled 2014-07-08: qty 2

## 2014-07-08 MED ORDER — PROMETHAZINE HCL 25 MG PO TABS: 25.0000 mg | ORAL_TABLET | Freq: Three times a day (TID) | ORAL | Status: DC | PRN

## 2014-07-08 MED ORDER — ACETAMINOPHEN 325 MG PO TABS
650.0000 mg | ORAL_TABLET | Freq: Once | ORAL | Status: AC
  Administered 2014-07-08: 650 mg via ORAL

## 2014-07-08 NOTE — ED Notes (Signed)
Pt lying on stretcher using cell phone. Given tylenol for fever and ginger ale.

## 2014-07-08 NOTE — ED Provider Notes (Signed)
CSN: [id number]     Arrival date & time 07/08/14  1516 History   None    Chief Complaint  Patient presents with  . Emesis  . Generalized Body Aches  . Diarrhea   (Consider location/radiation/quality/duration/timing/severity/associated sxs/prior Treatment)  HPI   Patient is a 29 year old female presenting tonight with complaints of abdominal pain with nausea vomiting and diarrhea since approximately 4 AM this morning. Patient states her young son has been sick with the same illness which started approximately 3 days ago which has now resolved. Patient has significant medical history for bipolar disorder, anxiety and depression.  In addition she states that she has hypertension. She states that she woke up at 4 AM this morning vomiting with abdominal pain and simultaneous diarrhea.  She statesshe has had nothing to eat or drink since then because she was afraid that she would throw up or have another bowel movement. Patient initially presented to [facility] ED for evaluation and workup and left due to extended wait times. Patient was seen for abdominal pain and STD workup at [facility] ED approximately 2 weeks ago and was prescribed tramadol for abdominal pain.  She states she has been taking that prescription for her current discomfort.  Past Medical History  Diagnosis Date  . Abnormal Pap smear of cervix   . Gonorrhea   . Chlamydia   . Herpes   . Pregnant state, incidental   . Migraines   . Depression   . Anxiety   . UTI (lower urinary tract infection)   . Preeclampsia   . Ovarian cyst    Past Surgical History  Procedure Laterality Date  . Colposcopy    . Dilation and curettage of uterus    . Tonsillectomy     Family History  Problem Relation Age of Onset  . Other Neg Hx   . Kidney disease Mother   . Cancer Paternal Grandmother    History  Substance Use Topics  . Smoking status: Current Every Day Smoker -- 0.50 packs/day    Types: Cigarettes, Cigars  . Smokeless tobacco: Never Used   . Alcohol Use: Yes     Comment: occasional   OB History    Gravida Para Term Preterm AB TAB SAB Ectopic Multiple Living   7 4 4  0 3 3 0 0 0 4     Review of Systems  Constitutional: Positive for fever, chills and fatigue.  HENT: Negative.   Eyes: Negative.   Respiratory: Negative.  Negative for shortness of breath.   Cardiovascular: Negative.   Gastrointestinal: Positive for nausea, vomiting, abdominal pain and diarrhea. Negative for constipation, blood in stool, abdominal distention, anal bleeding and rectal pain.  Genitourinary: Negative for dysuria, urgency, hematuria, flank pain, vaginal bleeding, vaginal discharge, difficulty urinating and vaginal pain.  Musculoskeletal: Negative.   Skin: Negative.  Negative for rash.  Allergic/Immunologic: Negative.   Neurological: Negative.   Hematological: Negative.   Psychiatric/Behavioral: Negative.        Extensive psych history.  No current complaints regarding depression.      Allergies  Review of patient's allergies indicates no known allergies.  Home Medications   Prior to Admission medications   Medication Sig Start Date End Date Taking? Authorizing Provider  acetaminophen-codeine 120-12 MG/5ML solution Take 10 mLs by mouth every 4 (four) hours as needed for moderate pain. Patient not taking: Reported on 04/22/2014 03/20/14   Dalia Heading, PA-C  albuterol (PROVENTIL HFA;VENTOLIN HFA) 108 (90 BASE) MCG/ACT inhaler Inhale 1-2 puffs into the lungs  every 6 (six) hours as needed for wheezing or shortness of breath.    Historical Provider, MD  Aspirin-Acetaminophen-Caffeine (GOODY HEADACHE PO) Take 1 packet by mouth as needed (pain).    Historical Provider, MD  Guaifenesin 1200 MG TB12 Take 1 tablet (1,200 mg total) by mouth 2 (two) times daily. Patient not taking: Reported on 04/22/2014 03/20/14   Dalia Heading, PA-C  HYDROcodone-acetaminophen (NORCO/VICODIN) 5-325 MG per tablet Take 1 tablet by mouth every 6 (six) hours  as needed for moderate pain. Patient not taking: Reported on 06/24/2014 04/22/14   Domenic Moras, PA-C  ibuprofen (ADVIL,MOTRIN) 200 MG tablet Take 800 mg by mouth every 6 (six) hours as needed (for pain).    Historical Provider, MD  LATUDA 20 MG TABS Take 1 tablet by mouth daily. 01/30/14   Historical Provider, MD  lisinopril-hydrochlorothiazide (PRINZIDE,ZESTORETIC) 10-12.5 MG per tablet Take 1 tablet by mouth daily. 01/30/14   Historical Provider, MD  metroNIDAZOLE (FLAGYL) 500 MG tablet Take 4 tablets (2,000 mg total) by mouth once. Patient not taking: Reported on 03/20/2014 01/27/14   Tresea Mall, CNM  metroNIDAZOLE (FLAGYL) 500 MG tablet Take 1 tablet (500 mg total) by mouth 2 (two) times daily. 06/24/14   Tiffany Carlota Raspberry, PA-C  omeprazole (PRILOSEC) 20 MG capsule Take 20 mg by mouth daily.  04/17/14   Historical Provider, MD  penicillin v potassium (VEETID) 500 MG tablet Take 1 tablet (500 mg total) by mouth 3 (three) times daily. Patient not taking: Reported on 06/24/2014 04/22/14   Domenic Moras, PA-C  predniSONE (DELTASONE) 50 MG tablet Take 1 tablet (50 mg total) by mouth daily. Patient not taking: Reported on 04/22/2014 03/20/14   Dalia Heading, PA-C  promethazine (PHENERGAN) 25 MG tablet Take 1 tablet (25 mg total) by mouth every 8 (eight) hours as needed for nausea or vomiting. 07/08/14   Nehemiah Settle, NP  QUEtiapine (SEROQUEL) 300 MG tablet Take 150-300 mg by mouth 2 (two) times daily. Take 150 mg every morning and 300 mg at bedtime    Historical Provider, MD  traMADol (ULTRAM) 50 MG tablet Take 1 tablet (50 mg total) by mouth every 6 (six) hours as needed. 06/24/14   Tiffany Carlota Raspberry, PA-C   BP 133/80 mmHg  Pulse 116  Temp(Src) 101.6 F (38.7 C) (Oral)  Resp 20  SpO2 99%  LMP 06/24/2014   Repeat vitals following tylenol administration down to 99.1 F oral.  Physical Exam  Constitutional: She is oriented to person, place, and time. She appears well-developed and well-nourished.  No distress.  Neck: Normal range of motion.  Negative nuchal rigidity.    Cardiovascular: Normal rate, regular rhythm, normal heart sounds and intact distal pulses.  Exam reveals no gallop and no friction rub.   No murmur heard. Pulmonary/Chest: Effort normal and breath sounds normal. No respiratory distress. She has no wheezes. She has no rales. She exhibits no tenderness.  Abdominal: Soft. Normal appearance and bowel sounds are normal. She exhibits no distension, no pulsatile midline mass and no mass. There is tenderness in the right lower quadrant. There is no rigidity, no rebound, no guarding, no CVA tenderness, no tenderness at McBurney's point and negative Murphy's sign.  Negative organomegaly and pain is nonradiating in nature.  Has been continuously in RLQ.  Heel jar test negative and patient able to jump down from exam table without difficulty.   Neurological: She is alert and oriented to person, place, and time.  Skin: Skin is warm. She is not diaphoretic.  Nursing note and vitals reviewed.   ED Course  Procedures (including critical care time)  CBC    Component Value Date/Time   WBC 7.6 07/08/2014 1352   RBC 4.40 07/08/2014 1352   HGB 12.5 07/08/2014 1352   HCT 38.2 07/08/2014 1352   PLT 279 07/08/2014 1352   MCV 86.8 07/08/2014 1352   MCH 28.4 07/08/2014 1352   MCHC 32.7 07/08/2014 1352   RDW 14.0 07/08/2014 1352   LYMPHSABS 0.6* 07/08/2014 1352   MONOABS 0.3 07/08/2014 1352   EOSABS 0.0 07/08/2014 1352   BASOSABS 0.0 07/08/2014 1352   CMP Latest Ref Rng 07/08/2014 06/24/2014 12/02/2013  Glucose 70 - 99 mg/dL 116(H) 103(H) 104(H)  BUN 6 - 23 mg/dL 11 9 10   Creatinine 0.50 - 1.10 mg/dL 1.34(H) 1.20(H) 0.94  Sodium 135 - 145 mmol/L 139 139 141  Potassium 3.5 - 5.1 mmol/L 3.8 3.6 3.6(L)  Chloride 96 - 112 mmol/L 105 102 104  CO2 19 - 32 mmol/L 27 - 22  Calcium 8.4 - 10.5 mg/dL 9.0 - 9.2  Total Protein 6.0 - 8.3 g/dL 7.1 - 7.0  Total Bilirubin 0.3 - 1.2 mg/dL 0.5 -  <0.2(L)  Alkaline Phos 39 - 117 U/L 48 - 61  AST 0 - 37 U/L 26 - 14  ALT 0 - 35 U/L 22 - 13    Labs Review Labs Reviewed  POCT URINALYSIS DIP (DEVICE) - Abnormal; Notable for the following:    Protein, ur 30 (*)    All other components within normal limits  POCT PREGNANCY, URINE   Results for orders placed or performed during the hospital encounter of 07/08/14  POCT urinalysis dip (device)  Result Value Ref Range   Glucose, UA NEGATIVE NEGATIVE mg/dL   Bilirubin Urine NEGATIVE NEGATIVE   Ketones, ur NEGATIVE NEGATIVE mg/dL   Specific Gravity, Urine 1.025 1.005 - 1.030   Hgb urine dipstick NEGATIVE NEGATIVE   pH 6.5 5.0 - 8.0   Protein, ur 30 (A) NEGATIVE mg/dL   Urobilinogen, UA 0.2 0.0 - 1.0 mg/dL   Nitrite NEGATIVE NEGATIVE   Leukocytes, UA NEGATIVE NEGATIVE  Pregnancy, urine POC  Result Value Ref Range   Preg Test, Ur NEGATIVE NEGATIVE    Imaging Review No results found.   MDM   1. Gastroenteritis    . Meds ordered this encounter  Medications  . acetaminophen (TYLENOL) tablet 650 mg    Sig:   . promethazine (PHENERGAN) 25 MG tablet    Sig: Take 1 tablet (25 mg total) by mouth every 8 (eight) hours as needed for nausea or vomiting.    Dispense:  15 tablet    Refill:  0   Patient able to tolerate two cups of ginger ale without vomiting or abdominal distress. Seems more comfortable since tylenol has taken effect.  Plan of care was discussed with Dr. Juventino Slovak and appendicitis is unlikely.  Patient strongly advised to return for failure to improve or worsening of symptoms.  Discussed the importance of hydration and comfort measures.  The patient verbalizes understanding and agrees to plan of care.      Nehemiah Settle, NP 07/08/14 2006

## 2014-07-08 NOTE — ED Notes (Signed)
Pt up to window asking "to be taken out of the system so that I can go somewhere else"

## 2014-07-08 NOTE — ED Notes (Signed)
Pt is here with complaints of vomiting with onset last night and generalized body aches. Pt is tearful. States her children were recently sick as well.

## 2014-07-08 NOTE — ED Notes (Signed)
pts family up to window asking how long the wait is, informed them where pt is in line.

## 2014-07-08 NOTE — Discharge Instructions (Signed)

## 2014-07-08 NOTE — ED Notes (Signed)
Patient was observed to be calmly laying on her side scrolling on her cell phone. Was permitted to lay down in triage while she was waiting for a bed in the bed assignment rotation after having left the ED

## 2014-07-08 NOTE — ED Notes (Signed)
Pt sts N/V/D x 2 days with body aches

## 2014-10-24 ENCOUNTER — Encounter (HOSPITAL_COMMUNITY): Payer: Self-pay | Admitting: *Deleted

## 2014-10-24 ENCOUNTER — Emergency Department (HOSPITAL_COMMUNITY)
Admission: EM | Admit: 2014-10-24 | Discharge: 2014-10-24 | Disposition: A | Discharge status: Home / Self Care | Payer: Medicaid Other | Attending: Emergency Medicine | Admitting: Emergency Medicine

## 2014-10-24 DIAGNOSIS — K088 Other specified disorders of teeth and supporting structures: Secondary | ICD-10-CM | POA: Insufficient documentation

## 2014-10-24 DIAGNOSIS — Z79899 Other long term (current) drug therapy: Secondary | ICD-10-CM | POA: Insufficient documentation

## 2014-10-24 DIAGNOSIS — M545 Low back pain, unspecified: Secondary | ICD-10-CM

## 2014-10-24 DIAGNOSIS — G43909 Migraine, unspecified, not intractable, without status migrainosus: Secondary | ICD-10-CM | POA: Diagnosis not present

## 2014-10-24 DIAGNOSIS — M542 Cervicalgia: Secondary | ICD-10-CM | POA: Diagnosis not present

## 2014-10-24 DIAGNOSIS — K0889 Other specified disorders of teeth and supporting structures: Secondary | ICD-10-CM

## 2014-10-24 DIAGNOSIS — F419 Anxiety disorder, unspecified: Secondary | ICD-10-CM | POA: Insufficient documentation

## 2014-10-24 DIAGNOSIS — Z8742 Personal history of other diseases of the female genital tract: Secondary | ICD-10-CM | POA: Insufficient documentation

## 2014-10-24 DIAGNOSIS — Z87440 Personal history of urinary (tract) infections: Secondary | ICD-10-CM | POA: Insufficient documentation

## 2014-10-24 DIAGNOSIS — Z720 Tobacco use: Secondary | ICD-10-CM | POA: Diagnosis not present

## 2014-10-24 DIAGNOSIS — F319 Bipolar disorder, unspecified: Secondary | ICD-10-CM | POA: Diagnosis not present

## 2014-10-24 DIAGNOSIS — Z8619 Personal history of other infectious and parasitic diseases: Secondary | ICD-10-CM | POA: Insufficient documentation

## 2014-10-24 DIAGNOSIS — Z792 Long term (current) use of antibiotics: Secondary | ICD-10-CM | POA: Insufficient documentation

## 2014-10-24 MED ORDER — AMOXICILLIN 500 MG PO CAPS: 500.0000 mg | ORAL_CAPSULE | Freq: Three times a day (TID) | ORAL | Status: DC

## 2014-10-24 MED ORDER — HYDROCODONE-ACETAMINOPHEN 5-325 MG PO TABS
2.0000 | ORAL_TABLET | Freq: Once | ORAL | Status: AC
  Administered 2014-10-24: 2 via ORAL
  Filled 2014-10-24: qty 2

## 2014-10-24 MED ORDER — HYDROCODONE-ACETAMINOPHEN 5-325 MG PO TABS: 1.0000 | ORAL_TABLET | Freq: Four times a day (QID) | ORAL | Status: DC | PRN

## 2014-10-24 NOTE — ED Provider Notes (Signed)
CSN: [id number]     Arrival date & time 10/24/14  2114 History   This chart was scribed for [name], PA-C working with [name], MD by Mercy Moore, ED Scribe. This patient was seen in room TR10C/TR10C and the patient's care was started at 10:39 PM.   Chief Complaint  Patient presents with  . Dental Pain  . Back Pain    The history is provided by the patient. No language interpreter was used.   HPI Comments: Robin Horn is a 29 y.o. female who presents to the Emergency Department complaining of right lower dental pain ongoing for three days now.  Patient reports pain with chewing and states she been eating soft foods to avoid exacerbated pain. Patient reports previous pain and facial swelling associated with her impacted wisdom tooth. Patient states that she plans to contact her dentist Monday morning, but is seeking pain relief until then.   Patient secondarily complains of worsening mid back pain shooting into her neck. Patient attributes exacerbation to recent spring cleaning. Patient denies pain in lower extremities, numbness or tingling. 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   Past Medical History  Diagnosis Date  . Abnormal Pap smear of cervix   . Gonorrhea   . Chlamydia   . Herpes   . Pregnant state, incidental   . Migraines   . Depression   . Anxiety   . UTI (lower urinary tract infection)   . Preeclampsia   . Ovarian cyst    Past Surgical History  Procedure Laterality Date  . Colposcopy    . Dilation and curettage of uterus    . Tonsillectomy     Family History  Problem Relation Age of Onset  . Other Neg Hx   . Kidney disease Mother   . Cancer Paternal Grandmother    History  Substance Use Topics  . Smoking status: Current Every Day Smoker -- 0.50 packs/day    Types: Cigarettes, Cigars  . Smokeless tobacco: Never Used  . Alcohol Use: Yes     Comment: occasional   OB History    Gravida Para Term Preterm AB  TAB SAB Ectopic Multiple Living   7 4 4  0 3 3 0 0 0 4     Review of Systems  Constitutional: Negative for fever and chills.  HENT: Positive for dental problem.   Musculoskeletal: Positive for myalgias, back pain and neck pain.    Allergies  Review of patient's allergies indicates no known allergies.  Home Medications   Prior to Admission medications   Medication Sig Start Date End Date Taking? Authorizing Provider  acetaminophen-codeine 120-12 MG/5ML solution Take 10 mLs by mouth every 4 (four) hours as needed for moderate pain. Patient not taking: Reported on 04/22/2014 03/20/14   Dalia Heading, PA-C  albuterol (PROVENTIL HFA;VENTOLIN HFA) 108 (90 BASE) MCG/ACT inhaler Inhale 1-2 puffs into the lungs every 6 (six) hours as needed for wheezing or shortness of breath.    Historical Provider, MD  Aspirin-Acetaminophen-Caffeine (GOODY HEADACHE PO) Take 1 packet by mouth as needed (pain).    Historical Provider, MD  Guaifenesin 1200 MG TB12 Take 1 tablet (1,200 mg total) by mouth 2 (two) times daily. Patient not taking: Reported on 04/22/2014 03/20/14   Dalia Heading, PA-C  HYDROcodone-acetaminophen (NORCO/VICODIN) 5-325 MG per tablet Take 1 tablet by mouth every 6 (six) hours as needed for moderate pain. Patient not taking: Reported on 06/24/2014 04/22/14   Domenic Moras,  PA-C  ibuprofen (ADVIL,MOTRIN) 200 MG tablet Take 800 mg by mouth every 6 (six) hours as needed (for pain).    Historical Provider, MD  LATUDA 20 MG TABS Take 1 tablet by mouth daily. 01/30/14   Historical Provider, MD  lisinopril-hydrochlorothiazide (PRINZIDE,ZESTORETIC) 10-12.5 MG per tablet Take 1 tablet by mouth daily. 01/30/14   Historical Provider, MD  metroNIDAZOLE (FLAGYL) 500 MG tablet Take 4 tablets (2,000 mg total) by mouth once. Patient not taking: Reported on 03/20/2014 01/27/14   Tresea Mall, CNM  metroNIDAZOLE (FLAGYL) 500 MG tablet Take 1 tablet (500 mg total) by mouth 2 (two) times daily. 06/24/14    Tiffany Carlota Raspberry, PA-C  omeprazole (PRILOSEC) 20 MG capsule Take 20 mg by mouth daily.  04/17/14   Historical Provider, MD  penicillin v potassium (VEETID) 500 MG tablet Take 1 tablet (500 mg total) by mouth 3 (three) times daily. Patient not taking: Reported on 06/24/2014 04/22/14   Domenic Moras, PA-C  predniSONE (DELTASONE) 50 MG tablet Take 1 tablet (50 mg total) by mouth daily. Patient not taking: Reported on 04/22/2014 03/20/14   Dalia Heading, PA-C  promethazine (PHENERGAN) 25 MG tablet Take 1 tablet (25 mg total) by mouth every 8 (eight) hours as needed for nausea or vomiting. 07/08/14   Nehemiah Settle, NP  QUEtiapine (SEROQUEL) 300 MG tablet Take 150-300 mg by mouth 2 (two) times daily. Take 150 mg every morning and 300 mg at bedtime    Historical Provider, MD  traMADol (ULTRAM) 50 MG tablet Take 1 tablet (50 mg total) by mouth every 6 (six) hours as needed. 06/24/14   Delos Haring, PA-C   Triage Vitals: BP 139/95 mmHg  Pulse 87  Temp(Src) 98.7 F (37.1 C) (Oral)  Resp 20  Wt 171 lb (77.565 kg)  SpO2 100%  LMP 10/01/2014 Physical Exam  Constitutional: She is oriented to person, place, and time. She appears well-developed and well-nourished. No distress.  HENT:  Head: Normocephalic and atraumatic.  Eyes: EOM are normal.  Neck: Neck supple. No tracheal deviation present.  Cardiovascular: Normal rate.   Pulmonary/Chest: Effort normal. No respiratory distress.  Musculoskeletal: Normal range of motion.  Neurological: She is alert and oriented to person, place, and time.  Skin: Skin is warm and dry.  Psychiatric: She has a normal mood and affect. Her behavior is normal.  Nursing note and vitals reviewed.   ED Course  Procedures (including critical care time)  COORDINATION OF CARE: 10:43 PM- Discussed treatment plan with patient at bedside and patient agreed to plan.   Labs Review Labs Reviewed - No data to display  Imaging Review No results found.   EKG  Interpretation None      MDM   Final diagnoses:  Toothache  Midline low back pain without sciatica     I personally performed the services in this documentation, which was scribed in my presence.  The recorded information has been reviewed and considered.   Ronnald Collum.   Hollace Kinnier [place], PA-C 10/25/14 0214  [name], MD 10/25/14 [phone]

## 2014-10-24 NOTE — Discharge Instructions (Signed)
Dental Pain A tooth ache may be caused by cavities (tooth decay). Cavities expose the nerve of the tooth to air and hot or cold temperatures. It may come from an infection or abscess (also called a boil or furuncle) around your tooth. It is also often caused by dental caries (tooth decay). This causes the pain you are having. DIAGNOSIS  Your caregiver can diagnose this problem by exam. TREATMENT   If caused by an infection, it may be treated with medications which kill germs (antibiotics) and pain medications as prescribed by your caregiver. Take medications as directed.  Only take over-the-counter or prescription medicines for pain, discomfort, or fever as directed by your caregiver.  Whether the tooth ache today is caused by infection or dental disease, you should see your dentist as soon as possible for further care. SEEK MEDICAL CARE IF: The exam and treatment you received today has been provided on an emergency basis only. This is not a substitute for complete medical or dental care. If your problem worsens or new problems (symptoms) appear, and you are unable to meet with your dentist, call or return to this location. SEEK IMMEDIATE MEDICAL CARE IF:   You have a fever.  You develop redness and swelling of your face, jaw, or neck.  You are unable to open your mouth.  You have severe pain uncontrolled by pain medicine. MAKE SURE YOU:   Understand these instructions.  Will watch your condition.  Will get help right away if you are not doing well or get worse. Document Released: 04/17/2005 Document Revised: 07/10/2011 Document Reviewed: 12/04/2007 [facility] Patient Information 2015 [place], [place]. This information is not intended to replace advice given to you by your health care provider. Make sure you discuss any questions you have with your health care provider. Back Pain, Adult Low back pain is very common. About 1 in 5 people have back pain.The cause of low back pain is rarely  dangerous. The pain often gets better over time.About half of people with a sudden onset of back pain feel better in just 2 weeks. About 8 in 10 people feel better by 6 weeks.  CAUSES Some common causes of back pain include:  Strain of the muscles or ligaments supporting the spine.  Wear and tear (degeneration) of the spinal discs.  Arthritis.  Direct injury to the back. DIAGNOSIS Most of the time, the direct cause of low back pain is not known.However, back pain can be treated effectively even when the exact cause of the pain is unknown.Answering your caregiver's questions about your overall health and symptoms is one of the most accurate ways to make sure the cause of your pain is not dangerous. If your caregiver needs more information, he or she may order lab work or imaging tests (X-rays or MRIs).However, even if imaging tests show changes in your back, this usually does not require surgery. HOME CARE INSTRUCTIONS For many people, back pain returns.Since low back pain is rarely dangerous, it is often a condition that people can learn to [facility] their own.   Remain active. It is stressful on the back to sit or stand in one place. Do not sit, drive, or stand in one place for more than 30 minutes at a time. Take short walks on level surfaces as soon as pain allows.Try to increase the length of time you walk each day.  Do not stay in bed.Resting more than 1 or 2 days can delay your recovery.  Do not avoid exercise or  work.Your body is made to move.It is not dangerous to be active, even though your back may hurt.Your back will likely heal faster if you return to being active before your pain is gone.  Pay attention to your body when you bend and lift. Many people have less discomfortwhen lifting if they bend their knees, keep the load close to their bodies,and avoid twisting. Often, the most comfortable positions are those that put less stress on your recovering back.  Find a  comfortable position to sleep. Use a firm mattress and lie on your side with your knees slightly bent. If you lie on your back, put a pillow under your knees.  Only take over-the-counter or prescription medicines as directed by your caregiver. Over-the-counter medicines to reduce pain and inflammation are often the most helpful.Your caregiver may prescribe muscle relaxant drugs.These medicines help dull your pain so you can more quickly return to your normal activities and healthy exercise.  Put ice on the injured area.  Put ice in a plastic bag.  Place a towel between your skin and the bag.  Leave the ice on for 15-20 minutes, 03-04 times a day for the first 2 to 3 days. After that, ice and heat may be alternated to reduce pain and spasms.  Ask your caregiver about trying back exercises and gentle massage. This may be of some benefit.  Avoid feeling anxious or stressed.Stress increases muscle tension and can worsen back pain.It is important to recognize when you are anxious or stressed and learn ways to manage it.Exercise is a great option. SEEK MEDICAL CARE IF:  You have pain that is not relieved with rest or medicine.  You have pain that does not improve in 1 week.  You have new symptoms.  You are generally not feeling well. SEEK IMMEDIATE MEDICAL CARE IF:   You have pain that radiates from your back into your legs.  You develop new bowel or bladder control problems.  You have unusual weakness or numbness in your arms or legs.  You develop nausea or vomiting.  You develop abdominal pain.  You feel faint. Document Released: 04/17/2005 Document Revised: 10/17/2011 Document Reviewed: 08/19/2013 [facility] Patient Information 2015 [place], [place]. This information is not intended to replace advice given to you by your health care provider. Make sure you discuss any questions you have with your health care provider.

## 2014-10-24 NOTE — ED Notes (Signed)
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

## 2014-10-24 NOTE — ED Notes (Signed)
Pt is not in room.  

## 2015-01-26 ENCOUNTER — Encounter (HOSPITAL_COMMUNITY): Payer: Self-pay | Admitting: *Deleted

## 2015-01-26 ENCOUNTER — Emergency Department (HOSPITAL_COMMUNITY)
Admission: EM | Admit: 2015-01-26 | Discharge: 2015-01-26 | Disposition: A | Discharge status: Home / Self Care | Payer: Medicaid Other | Attending: Emergency Medicine | Admitting: Emergency Medicine

## 2015-01-26 DIAGNOSIS — Z79899 Other long term (current) drug therapy: Secondary | ICD-10-CM | POA: Insufficient documentation

## 2015-01-26 DIAGNOSIS — Z720 Tobacco use: Secondary | ICD-10-CM | POA: Insufficient documentation

## 2015-01-26 DIAGNOSIS — Z87440 Personal history of urinary (tract) infections: Secondary | ICD-10-CM | POA: Diagnosis not present

## 2015-01-26 DIAGNOSIS — G43909 Migraine, unspecified, not intractable, without status migrainosus: Secondary | ICD-10-CM | POA: Insufficient documentation

## 2015-01-26 DIAGNOSIS — Z8619 Personal history of other infectious and parasitic diseases: Secondary | ICD-10-CM | POA: Diagnosis not present

## 2015-01-26 DIAGNOSIS — M545 Low back pain: Secondary | ICD-10-CM | POA: Diagnosis not present

## 2015-01-26 DIAGNOSIS — F419 Anxiety disorder, unspecified: Secondary | ICD-10-CM | POA: Insufficient documentation

## 2015-01-26 DIAGNOSIS — F329 Major depressive disorder, single episode, unspecified: Secondary | ICD-10-CM | POA: Diagnosis not present

## 2015-01-26 DIAGNOSIS — R509 Fever, unspecified: Secondary | ICD-10-CM | POA: Diagnosis present

## 2015-01-26 DIAGNOSIS — K1379 Other lesions of oral mucosa: Secondary | ICD-10-CM

## 2015-01-26 DIAGNOSIS — Z8742 Personal history of other diseases of the female genital tract: Secondary | ICD-10-CM | POA: Insufficient documentation

## 2015-01-26 DIAGNOSIS — G8929 Other chronic pain: Secondary | ICD-10-CM | POA: Diagnosis not present

## 2015-01-26 DIAGNOSIS — B349 Viral infection, unspecified: Secondary | ICD-10-CM | POA: Diagnosis not present

## 2015-01-26 DIAGNOSIS — K121 Other forms of stomatitis: Secondary | ICD-10-CM | POA: Insufficient documentation

## 2015-01-26 LAB — RAPID STREP SCREEN (MED CTR MEBANE ONLY): Streptococcus, Group A Screen (Direct): NEGATIVE

## 2015-01-26 MED ORDER — TRAMADOL HCL 50 MG PO TABS
50.0000 mg | ORAL_TABLET | Freq: Once | ORAL | Status: AC
  Administered 2015-01-26: 50 mg via ORAL
  Filled 2015-01-26: qty 1

## 2015-01-26 MED ORDER — TRAMADOL HCL 50 MG PO TABS: 50.0000 mg | ORAL_TABLET | Freq: Four times a day (QID) | ORAL | Status: DC | PRN

## 2015-01-26 MED ORDER — ACETAMINOPHEN 325 MG PO TABS
650.0000 mg | ORAL_TABLET | Freq: Once | ORAL | Status: AC
  Administered 2015-01-26: 650 mg via ORAL
  Filled 2015-01-26: qty 2

## 2015-01-26 NOTE — ED Notes (Signed)
Pt states she didn't have "any problems" until starting gabapentin. Pt endorses HSV with tx for recent outbreak. Pt states she has been taking vicodin for her pain.

## 2015-01-26 NOTE — ED Notes (Signed)
Patient states she is being treated for yeast infection, first dose on Friday. States general bodyaches, sore throat, and fever.

## 2015-01-26 NOTE — Discharge Instructions (Signed)
Follow up with your doctor on Friday as planned. Do not drive while taking the narcotic pain medication as it will make you sleepy.

## 2015-01-26 NOTE — ED Provider Notes (Signed)
CSN: [id number]     Arrival date & time 01/26/15  1523 History  By signing my name below, I, Robin Horn, attest that this documentation has been prepared under the direction and in the presence of Debroah Baller, NP.  Electronically Signed: Tula Horn, ED Scribe. 01/26/2015. 4:00 PM.   Chief Complaint  Patient presents with  . Fever   The history is provided by the patient. No language interpreter was used.   HPI Comments: Robin Horn is a 29 y.o. female with a history of genital herpes and rheumatoid arthritis who presents to the Emergency Department complaining of constant, moderate generalized body aches that started 1 week ago. Pt states nausea as an associated symptom. She has tried Hydrocodone with some relief. Pt reports symptoms started after she saw her PCP last week for a genital herpes outbreak and possible yeast infection that started 2 weeks ago. She took Diflucan with some relief to the yeast infection. At that visit, her PCP advised her to use Gabapentin-300 mg BID for her rheumatoid arthritis pain. She noted an adverse reaction of nausea before stopping the treatment. Pt reports a history of similar symptoms after she stops medication. She denies fever, chills, nausea, vomiting, abdominal pain, HA and back pain.    Friday morning [name] at [facility]   Past Medical History  Diagnosis Date  . Abnormal Pap smear of cervix   . Gonorrhea   . Chlamydia   . Herpes   . Pregnant state, incidental   . Migraines   . Depression   . Anxiety   . UTI (lower urinary tract infection)   . Preeclampsia   . Ovarian cyst    Past Surgical History  Procedure Laterality Date  . Colposcopy    . Dilation and curettage of uterus    . Tonsillectomy     Family History  Problem Relation Age of Onset  . Other Neg Hx   . Kidney disease Mother   . Cancer Paternal Grandmother    Social History  Substance Use Topics  . Smoking status: Current Every Day Smoker  -- 0.50 packs/day    Types: Cigarettes, Cigars  . Smokeless tobacco: Never Used  . Alcohol Use: Yes     Comment: occasional   OB History    Gravida Para Term Preterm AB TAB SAB Ectopic Multiple Living   7 4 4  0 3 3 0 0 0 4     Review of Systems  Constitutional: Positive for fever and chills.  Gastrointestinal: Positive for nausea.  Musculoskeletal: Positive for myalgias and back pain.  Skin: Positive for wound.  Neurological: Positive for headaches.  All other systems reviewed and are negative.     Allergies  Review of patient's allergies indicates no known allergies.  Home Medications   Prior to Admission medications   Medication Sig Start Date End Date Taking? Authorizing Provider  albuterol (PROVENTIL HFA;VENTOLIN HFA) 108 (90 BASE) MCG/ACT inhaler Inhale 1-2 puffs into the lungs every 6 (six) hours as needed for wheezing or shortness of breath.    Historical Provider, MD  amoxicillin (AMOXIL) 500 MG capsule Take 1 capsule (500 mg total) by mouth 3 (three) times daily. 10/24/14   Fransico Meadow, PA-C  Aspirin-Acetaminophen-Caffeine (GOODY HEADACHE PO) Take 1 packet by mouth as needed (pain).    Historical Provider, MD  HYDROcodone-acetaminophen (NORCO/VICODIN) 5-325 MG per tablet Take 1 tablet by mouth every 6 (six) hours as needed for moderate pain. 10/24/14   Hollace Kinnier  [place], PA-C  ibuprofen (ADVIL,MOTRIN) 200 MG tablet Take 800 mg by mouth every 6 (six) hours as needed (for pain).    Historical Provider, MD  LATUDA 20 MG TABS Take 1 tablet by mouth daily. 01/30/14   Historical Provider, MD  lisinopril-hydrochlorothiazide (PRINZIDE,ZESTORETIC) 10-12.5 MG per tablet Take 1 tablet by mouth daily. 01/30/14   Historical Provider, MD  omeprazole (PRILOSEC) 20 MG capsule Take 20 mg by mouth daily.  04/17/14   Historical Provider, MD  promethazine (PHENERGAN) 25 MG tablet Take 1 tablet (25 mg total) by mouth every 8 (eight) hours as needed for nausea or vomiting. 07/08/14   Nehemiah Settle, NP  QUEtiapine (SEROQUEL) 300 MG tablet Take 150-300 mg by mouth 2 (two) times daily. Take 150 mg every morning and 300 mg at bedtime    Historical Provider, MD  traMADol (ULTRAM) 50 MG tablet Take 1 tablet (50 mg total) by mouth every 6 (six) hours as needed. 01/26/15   Hope Bunnie Pion, NP   BP 124/88 mmHg  Pulse 82  Temp(Src) 97.9 F (36.6 C) (Oral)  Resp 16  SpO2 100% Physical Exam  Constitutional: She is oriented to person, place, and time. She appears well-developed and well-nourished. No distress.  HENT:  Head: Normocephalic and atraumatic.  Oral ulcer on the left soft palate  Eyes: Conjunctivae and EOM are normal.  Neck: Normal range of motion. Neck supple. No tracheal deviation present.  Cardiovascular: Normal rate and regular rhythm.   Pulmonary/Chest: Effort normal and breath sounds normal. No respiratory distress.  Abdominal: Soft. Bowel sounds are normal. There is no tenderness.  Musculoskeletal: Normal range of motion.  Radial and pedal pulses 2+, adequate circulation. Pain with range of motion of joints due to arthritis  Lymphadenopathy:    She has no cervical adenopathy.  Neurological: She is alert and oriented to person, place, and time.  Skin: Skin is warm and dry.  Psychiatric: She has a normal mood and affect. Her behavior is normal.  Nursing note and vitals reviewed.   ED Course  Procedures   DIAGNOSTIC STUDIES: Oxygen Saturation is 100% on RA, normal by my interpretation.    COORDINATION OF CARE: 4:04 PM Discussed treatment plan with pt which includes Ultram and Tylenol. Pt agreed to plan.   Labs Review Results for orders placed or performed during the hospital encounter of 01/26/15 (from the past 24 hour(s))  Rapid strep screen (not at [facility])     Status: None   Collection Time: 01/26/15  3:52 PM  Result Value Ref Range   Streptococcus, Group A Screen (Direct) NEGATIVE NEGATIVE    I have personally reviewed and evaluated these lab results as part  of my medical decision-making.  MDM  29 y.o. female with hx of chronic pain due to arthritis and has stopped her Neurontin, also currently with oral ulcer and recurrent genital herpes. Stable for d/c without fever and does not appear toxic. Will treat her pain until she can follow up with her PCP later this week.   Final diagnoses:  Recurrent oral ulcers  Chronic pain  Viral illness   I personally performed the services described in this documentation, which was scribed in my presence. The recorded information has been reviewed and is accurate.   [address] [place], [place] 01/26/15 1703  [name], MD 01/27/15 [phone]

## 2015-01-26 NOTE — ED Notes (Signed)
Pt is in stable condition upon d/c and ambulates from ED. 

## 2015-01-28 LAB — CULTURE, GROUP A STREP: Strep A Culture: NEGATIVE

## 2015-02-20 ENCOUNTER — Emergency Department (HOSPITAL_COMMUNITY): Payer: Medicaid Other

## 2015-02-20 ENCOUNTER — Encounter (HOSPITAL_COMMUNITY): Payer: Self-pay | Admitting: Emergency Medicine

## 2015-02-20 ENCOUNTER — Emergency Department (HOSPITAL_COMMUNITY)
Admission: EM | Admit: 2015-02-20 | Discharge: 2015-02-20 | Disposition: A | Discharge status: Home / Self Care | Payer: Medicaid Other | Attending: Emergency Medicine | Admitting: Emergency Medicine

## 2015-02-20 DIAGNOSIS — F419 Anxiety disorder, unspecified: Secondary | ICD-10-CM | POA: Diagnosis not present

## 2015-02-20 DIAGNOSIS — Z720 Tobacco use: Secondary | ICD-10-CM | POA: Insufficient documentation

## 2015-02-20 DIAGNOSIS — Z8742 Personal history of other diseases of the female genital tract: Secondary | ICD-10-CM | POA: Insufficient documentation

## 2015-02-20 DIAGNOSIS — R443 Hallucinations, unspecified: Secondary | ICD-10-CM | POA: Insufficient documentation

## 2015-02-20 DIAGNOSIS — R51 Headache: Secondary | ICD-10-CM | POA: Diagnosis present

## 2015-02-20 DIAGNOSIS — Z79899 Other long term (current) drug therapy: Secondary | ICD-10-CM | POA: Insufficient documentation

## 2015-02-20 DIAGNOSIS — R519 Headache, unspecified: Secondary | ICD-10-CM

## 2015-02-20 DIAGNOSIS — Z8679 Personal history of other diseases of the circulatory system: Secondary | ICD-10-CM | POA: Insufficient documentation

## 2015-02-20 DIAGNOSIS — F329 Major depressive disorder, single episode, unspecified: Secondary | ICD-10-CM | POA: Diagnosis not present

## 2015-02-20 DIAGNOSIS — Z8619 Personal history of other infectious and parasitic diseases: Secondary | ICD-10-CM | POA: Diagnosis not present

## 2015-02-20 DIAGNOSIS — Z87440 Personal history of urinary (tract) infections: Secondary | ICD-10-CM | POA: Insufficient documentation

## 2015-02-20 DIAGNOSIS — R42 Dizziness and giddiness: Secondary | ICD-10-CM | POA: Insufficient documentation

## 2015-02-20 DIAGNOSIS — Z792 Long term (current) use of antibiotics: Secondary | ICD-10-CM | POA: Diagnosis not present

## 2015-02-20 LAB — I-STAT CHEM 8, ED
BUN: 7 mg/dL (ref 6–20)
CREATININE: 1 mg/dL (ref 0.44–1.00)
Calcium, Ion: 1.27 mmol/L — ABNORMAL HIGH (ref 1.12–1.23)
Chloride: 100 mmol/L — ABNORMAL LOW (ref 101–111)
Glucose, Bld: 106 mg/dL — ABNORMAL HIGH (ref 65–99)
HCT: 37 % (ref 36.0–46.0)
Hemoglobin: 12.6 g/dL (ref 12.0–15.0)
POTASSIUM: 3.7 mmol/L (ref 3.5–5.1)
Sodium: 139 mmol/L (ref 135–145)
TCO2: 28 mmol/L (ref 0–100)

## 2015-02-20 MED ORDER — ALPRAZOLAM 0.5 MG PO TABS: 0.5000 mg | ORAL_TABLET | Freq: Every evening | ORAL | Status: DC | PRN

## 2015-02-20 MED ORDER — NAPROXEN 500 MG PO TABS: 500.0000 mg | ORAL_TABLET | Freq: Two times a day (BID) | ORAL | Status: DC

## 2015-02-20 MED ORDER — BUTALBITAL-APAP-CAFFEINE 50-325-40 MG PO TABS: 1.0000 | ORAL_TABLET | Freq: Four times a day (QID) | ORAL | Status: DC | PRN
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
## 2015-02-20 NOTE — ED Notes (Signed)
Patient states she has Chronic  back pain and states she can not see her doctor to get medication. Patient also states she does not have a headache she just feel dizzy. Patient able to ambulate without any assistance.

## 2015-02-20 NOTE — ED Provider Notes (Signed)
CSN: [id number]     Arrival date & time 02/20/15  1735 History   First MD Initiated Contact with Patient 02/20/15 1828     Chief Complaint  Patient presents with  . Headache  . Back Pain    HPI Pt has presents with complaints of a headache.  The headache as diffuse and is described as  A pressure.  She has felt dizzy and lightheaded.  The headache started several days ago. Patient denies any trouble with fevers or chills. She denies any sinus congestion or coughing. No neck pain or stiffness. No recent injuries. No numbness or weakness.  Patient has had some trouble with anxiety. She's had some nightmares recently as well. Patient also a history of bipolar disorder and she is followed at [facility]. Patient is concerned that this may be contributing. Occasionally she has had some hallucinations. Patient sees things that other people haven't seen. On having any active hallucinations at this time. She denies any suicidal or homicidal ideation.  Past Medical History  Diagnosis Date  . Abnormal Pap smear of cervix   . Gonorrhea   . Chlamydia   . Herpes   . Pregnant state, incidental   . Migraines   . Depression   . Anxiety   . UTI (lower urinary tract infection)   . Preeclampsia   . Ovarian cyst    Past Surgical History  Procedure Laterality Date  . Colposcopy    . Dilation and curettage of uterus    . Tonsillectomy     Family History  Problem Relation Age of Onset  . Other Neg Hx   . Kidney disease Mother   . Cancer Paternal Grandmother    Social History  Substance Use Topics  . Smoking status: Current Every Day Smoker -- 0.50 packs/day    Types: Cigarettes, Cigars  . Smokeless tobacco: Never Used  . Alcohol Use: Yes     Comment: occasional   OB History    Gravida Para Term Preterm AB TAB SAB Ectopic Multiple Living   7 4 4  0 3 3 0 0 0 4     Review of Systems    Allergies  Review of patient's allergies indicates no known allergies.  Home Medications   Prior to  Admission medications   Medication Sig Start Date End Date Taking? Authorizing Provider  albuterol (PROVENTIL HFA;VENTOLIN HFA) 108 (90 BASE) MCG/ACT inhaler Inhale 1-2 puffs into the lungs every 6 (six) hours as needed for wheezing or shortness of breath.    Historical Provider, MD  amoxicillin (AMOXIL) 500 MG capsule Take 1 capsule (500 mg total) by mouth 3 (three) times daily. 10/24/14   Fransico Meadow, PA-C  Aspirin-Acetaminophen-Caffeine (GOODY HEADACHE PO) Take 1 packet by mouth as needed (pain).    Historical Provider, MD  HYDROcodone-acetaminophen (NORCO/VICODIN) 5-325 MG per tablet Take 1 tablet by mouth every 6 (six) hours as needed for moderate pain. 10/24/14   Fransico Meadow, PA-C  ibuprofen (ADVIL,MOTRIN) 200 MG tablet Take 800 mg by mouth every 6 (six) hours as needed (for pain).    Historical Provider, MD  LATUDA 20 MG TABS Take 1 tablet by mouth daily. 01/30/14   Historical Provider, MD  lisinopril-hydrochlorothiazide (PRINZIDE,ZESTORETIC) 10-12.5 MG per tablet Take 1 tablet by mouth daily. 01/30/14   Historical Provider, MD  omeprazole (PRILOSEC) 20 MG capsule Take 20 mg by mouth daily.  04/17/14   Historical Provider, MD  promethazine (PHENERGAN) 25 MG tablet Take 1 tablet (25 mg total) by  mouth every 8 (eight) hours as needed for nausea or vomiting. 07/08/14   Nehemiah Settle, NP  QUEtiapine (SEROQUEL) 300 MG tablet Take 150-300 mg by mouth 2 (two) times daily. Take 150 mg every morning and 300 mg at bedtime    Historical Provider, MD  traMADol (ULTRAM) 50 MG tablet Take 1 tablet (50 mg total) by mouth every 6 (six) hours as needed. 01/26/15   Hope Bunnie Pion, NP   BP 128/81 mmHg  Pulse 78  Temp(Src) 98.7 F (37.1 C) (Oral)  Resp 18  Ht 5\' 3"  (1.6 m)  Wt 180 lb (81.647 kg)  BMI 31.89 kg/m2  SpO2 98%  LMP 02/20/2015 (Exact Date) Physical Exam  Constitutional: She appears well-developed and well-nourished. No distress.  HENT:  Head: Normocephalic and atraumatic.  Right Ear:  External ear normal.  Left Ear: External ear normal.  Eyes: Conjunctivae are normal. Right eye exhibits no discharge. Left eye exhibits no discharge. No scleral icterus.  Neck: Neck supple. No tracheal deviation present.  Cardiovascular: Normal rate, regular rhythm and intact distal pulses.   Pulmonary/Chest: Effort normal and breath sounds normal. No stridor. No respiratory distress. She has no wheezes. She has no rales.  Abdominal: Soft. Bowel sounds are normal. She exhibits no distension. There is no tenderness. There is no rebound and no guarding.  Musculoskeletal: She exhibits no edema or tenderness.  Neurological: She is alert. She has normal strength. No cranial nerve deficit (no facial droop, extraocular movements intact, no slurred speech) or sensory deficit. She exhibits normal muscle tone. She displays no seizure activity. Coordination normal.  Skin: Skin is warm and dry. No rash noted.  Psychiatric: Her affect is blunt. She is not actively hallucinating. She expresses no homicidal and no suicidal ideation.  Nursing note and vitals reviewed.   ED Course  Procedures (including critical care time) Labs Review Labs Reviewed  I-STAT CHEM 8, ED  I-STAT BETA HCG BLOOD, ED ([facility], [facility], [facility] ONLY)    Imaging Review No results found. I have personally reviewed and evaluated these images and lab results as part of my medical decision-making.   EKG Interpretation None      MDM    Pt has a history of headaches.  The headache today is not classic for migraine.  CT scan performed because of her complaints of dizziness.  NO deficits noted.     Will dc home with a few days of xanax for anxiety.  Rx fioriecet for headache.  Pt will follow up with her mental health provider.  Not acutely delusional  At this time there does not appear to be any evidence of an acute emergency medical condition and the patient appears stable for discharge with appropriate outpatient follow up.     Dorie Rank, MD 02/20/15 2037

## 2015-02-20 NOTE — Discharge Instructions (Signed)

## 2015-06-10 ENCOUNTER — Emergency Department (HOSPITAL_COMMUNITY)
Admission: EM | Admit: 2015-06-10 | Discharge: 2015-06-10 | Disposition: A | Discharge status: Home / Self Care | Payer: Medicaid Other | Attending: Emergency Medicine | Admitting: Emergency Medicine

## 2015-06-10 ENCOUNTER — Encounter (HOSPITAL_COMMUNITY): Payer: Self-pay | Admitting: Emergency Medicine

## 2015-06-10 DIAGNOSIS — N760 Acute vaginitis: Secondary | ICD-10-CM | POA: Insufficient documentation

## 2015-06-10 DIAGNOSIS — R1084 Generalized abdominal pain: Secondary | ICD-10-CM | POA: Diagnosis present

## 2015-06-10 DIAGNOSIS — Z792 Long term (current) use of antibiotics: Secondary | ICD-10-CM | POA: Diagnosis not present

## 2015-06-10 DIAGNOSIS — F17210 Nicotine dependence, cigarettes, uncomplicated: Secondary | ICD-10-CM | POA: Insufficient documentation

## 2015-06-10 DIAGNOSIS — N946 Dysmenorrhea, unspecified: Secondary | ICD-10-CM | POA: Insufficient documentation

## 2015-06-10 DIAGNOSIS — B9689 Other specified bacterial agents as the cause of diseases classified elsewhere: Secondary | ICD-10-CM

## 2015-06-10 DIAGNOSIS — F329 Major depressive disorder, single episode, unspecified: Secondary | ICD-10-CM | POA: Insufficient documentation

## 2015-06-10 DIAGNOSIS — Z8679 Personal history of other diseases of the circulatory system: Secondary | ICD-10-CM | POA: Insufficient documentation

## 2015-06-10 DIAGNOSIS — Z87440 Personal history of urinary (tract) infections: Secondary | ICD-10-CM | POA: Insufficient documentation

## 2015-06-10 DIAGNOSIS — Z3202 Encounter for pregnancy test, result negative: Secondary | ICD-10-CM | POA: Insufficient documentation

## 2015-06-10 DIAGNOSIS — R112 Nausea with vomiting, unspecified: Secondary | ICD-10-CM | POA: Diagnosis not present

## 2015-06-10 DIAGNOSIS — F419 Anxiety disorder, unspecified: Secondary | ICD-10-CM | POA: Insufficient documentation

## 2015-06-10 DIAGNOSIS — Z79899 Other long term (current) drug therapy: Secondary | ICD-10-CM | POA: Insufficient documentation

## 2015-06-10 DIAGNOSIS — Z8619 Personal history of other infectious and parasitic diseases: Secondary | ICD-10-CM | POA: Insufficient documentation

## 2015-06-10 LAB — COMPREHENSIVE METABOLIC PANEL
ALBUMIN: 3.7 g/dL (ref 3.5–5.0)
ALT: 15 U/L (ref 14–54)
AST: 18 U/L (ref 15–41)
Alkaline Phosphatase: 55 U/L (ref 38–126)
Anion gap: 9 (ref 5–15)
BUN: 14 mg/dL (ref 6–20)
CHLORIDE: 106 mmol/L (ref 101–111)
CO2: 25 mmol/L (ref 22–32)
Calcium: 9.1 mg/dL (ref 8.9–10.3)
Creatinine, Ser: 1.28 mg/dL — ABNORMAL HIGH (ref 0.44–1.00)
GFR calc non Af Amer: 56 mL/min — ABNORMAL LOW (ref >60)
Glucose, Bld: 98 mg/dL (ref 65–99)
Potassium: 4 mmol/L (ref 3.5–5.1)
Sodium: 140 mmol/L (ref 135–145)
Total Bilirubin: 0.3 mg/dL (ref 0.3–1.2)
Total Protein: 6.8 g/dL (ref 6.5–8.1)

## 2015-06-10 LAB — CBC WITH DIFFERENTIAL/PLATELET
BASOS ABS: 0 10*3/uL (ref 0.0–0.1)
BASOS PCT: 0 %
EOS ABS: 0.3 10*3/uL (ref 0.0–0.7)
Eosinophils Relative: 3 %
HCT: 35.6 % — ABNORMAL LOW (ref 36.0–46.0)
Hemoglobin: 11.7 g/dL — ABNORMAL LOW (ref 12.0–15.0)
Lymphocytes Relative: 31 %
Lymphs Abs: 2.9 10*3/uL (ref 0.7–4.0)
MCH: 28.7 pg (ref 26.0–34.0)
MCHC: 32.9 g/dL (ref 30.0–36.0)
MCV: 87.5 fL (ref 78.0–100.0)
Monocytes Absolute: 0.7 10*3/uL (ref 0.1–1.0)
Monocytes Relative: 7 %
NEUTROS PCT: 59 %
Neutro Abs: 5.4 10*3/uL (ref 1.7–7.7)
PLATELETS: 271 10*3/uL (ref 150–400)
RBC: 4.07 MIL/uL (ref 3.87–5.11)
RDW: 13.9 % (ref 11.5–15.5)
WBC: 9.4 10*3/uL (ref 4.0–10.5)

## 2015-06-10 LAB — WET PREP, GENITAL
Sperm: NONE SEEN
Trich, Wet Prep: NONE SEEN
YEAST WET PREP: NONE SEEN

## 2015-06-10 LAB — URINE MICROSCOPIC-ADD ON

## 2015-06-10 LAB — URINALYSIS, ROUTINE W REFLEX MICROSCOPIC
Bilirubin Urine: NEGATIVE
Glucose, UA: NEGATIVE mg/dL
KETONES UR: NEGATIVE mg/dL
Nitrite: NEGATIVE
PROTEIN: 30 mg/dL — AB
Specific Gravity, Urine: 1.022 (ref 1.005–1.030)
pH: 8 (ref 5.0–8.0)

## 2015-06-10 LAB — POC URINE PREG, ED: Preg Test, Ur: NEGATIVE

## 2015-06-10 MED ORDER — AZITHROMYCIN 250 MG PO TABS
1000.0000 mg | ORAL_TABLET | Freq: Once | ORAL | Status: AC
  Administered 2015-06-10: 1000 mg via ORAL
  Filled 2015-06-10: qty 4

## 2015-06-10 MED ORDER — ONDANSETRON 4 MG PO TBDP
4.0000 mg | ORAL_TABLET | Freq: Once | ORAL | Status: AC
  Administered 2015-06-10: 4 mg via ORAL
  Filled 2015-06-10: qty 1

## 2015-06-10 MED ORDER — METRONIDAZOLE 500 MG PO TABS: 500.0000 mg | ORAL_TABLET | Freq: Two times a day (BID) | ORAL | Status: DC

## 2015-06-10 MED ORDER — IBUPROFEN 800 MG PO TABS: 800.0000 mg | ORAL_TABLET | Freq: Three times a day (TID) | ORAL | Status: DC

## 2015-06-10 MED ORDER — HYDROCODONE-ACETAMINOPHEN 5-325 MG PO TABS
2.0000 | ORAL_TABLET | Freq: Once | ORAL | Status: AC
  Administered 2015-06-10: 2 via ORAL
  Filled 2015-06-10: qty 2

## 2015-06-10 MED ORDER — CEFTRIAXONE SODIUM 250 MG IJ SOLR
250.0000 mg | Freq: Once | INTRAMUSCULAR | Status: AC
  Administered 2015-06-10: 250 mg via INTRAMUSCULAR
  Filled 2015-06-10: qty 250

## 2015-06-10 MED ORDER — STERILE WATER FOR INJECTION IJ SOLN
INTRAMUSCULAR | Status: AC
  Filled 2015-06-10: qty 10

## 2015-06-10 NOTE — ED Notes (Signed)
The patient says she has been having painful periods.  She says she is bleeding heavy and having blood clots.  She is having to change her pads every hour 4 to 5 an hour.  She rates her pain 8/10.  The patient has taken two BC powders and naproxen.

## 2015-06-10 NOTE — ED Provider Notes (Signed)
CSN: [id number]     Arrival date & time 06/10/15  1624 History  By signing my name below, I, Robin Horn, attest that this documentation has been prepared under the direction and in the presence of [employer], PA-C. Electronically Signed: Meriel Horn, ED Scribe. 06/10/2015. 5:38 PM.   Chief Complaint  Patient presents with  . Abdominal Cramping    The patient says she has been having painful periods.  She says she is bleeding heavy and having blood clots.  She is having to change her pads every hour 4 to 5 an hour.     The history is provided by the patient. No language interpreter was used.   HPI Comments: Robin Horn is a 30 y.o. female, with a h/o ovarian cyst and anemia s/p menstruation, who presents to the Emergency Department complaining of sudden onset, constant vaginal bleeding onset 1 day ago. She states she has been changing her pads 1 time every hour and notes the pads are saturated each time. Pt reports diffuse lower abdominal cramping that has been constant since onset 2 weeks ago but worsened yesterday with onset of vaginal bleeding. She associates malodorous discharge, frequency of urination, nausea, and 2 episodes of emesis occurring today which she attributes to her worsening abdominal cramping. She is currently sexually active. The pt has taken Midol, naproxen, and BC powders and applied a heating pad with temporary relief lasting only 1 hour. She notes a h/o heavy menstrual cycles and anemia and states she was receiving B-12 injections  with last injection being 6 months ago. LMP 05/12/15. She is followed by OB GYN Dr. Noah Delaine . Pt denies light-headedness, dizziness, fevers, dysuria, or syncopal episodes.   Past Medical History  Diagnosis Date  . Abnormal Pap smear of cervix   . Gonorrhea   . Chlamydia   . Herpes   . Pregnant state, incidental   . Migraines   . Depression   . Anxiety   . UTI (lower urinary tract infection)   . Preeclampsia   . Ovarian cyst     Past Surgical History  Procedure Laterality Date  . Colposcopy    . Dilation and curettage of uterus    . Tonsillectomy     Family History  Problem Relation Age of Onset  . Other Neg Hx   . Kidney disease Mother   . Cancer Paternal Grandmother    Social History  Substance Use Topics  . Smoking status: Current Every Day Smoker -- 0.50 packs/day    Types: Cigarettes, Cigars  . Smokeless tobacco: Never Used  . Alcohol Use: Yes     Comment: occasional   OB History    Gravida Para Term Preterm AB TAB SAB Ectopic Multiple Living   7 4 4  0 3 3 0 0 0 4     Review of Systems  Constitutional: Negative for fever.  Gastrointestinal: Positive for nausea, vomiting and abdominal pain.  Genitourinary: Positive for frequency and vaginal bleeding. Negative for dysuria and vaginal discharge.  Neurological: Negative for dizziness, syncope and light-headedness.   Allergies  Review of patient's allergies indicates no known allergies.  Home Medications   Prior to Admission medications   Medication Sig Start Date End Date Taking? Authorizing Provider  albuterol (PROVENTIL HFA;VENTOLIN HFA) 108 (90 BASE) MCG/ACT inhaler Inhale 1-2 puffs into the lungs every 6 (six) hours as needed for wheezing or shortness of breath.    Historical Provider, MD  ALPRAZolam Duanne Moron) 0.5 MG tablet Take 1 tablet (0.5  mg total) by mouth at bedtime as needed for anxiety. 02/20/15   Dorie Rank, MD  amoxicillin (AMOXIL) 500 MG capsule Take 1 capsule (500 mg total) by mouth 3 (three) times daily. 10/24/14   Fransico Meadow, PA-C  butalbital-acetaminophen-caffeine (FIORICET) [phone] MG tablet Take 1-2 tablets by mouth every 6 (six) hours as needed for headache. 02/20/15 02/20/16  Dorie Rank, MD  HYDROcodone-acetaminophen (NORCO/VICODIN) 5-325 MG per tablet Take 1 tablet by mouth every 6 (six) hours as needed for moderate pain. 10/24/14   Hollace Kinnier Sofia, PA-C  LATUDA 20 MG TABS Take 1 tablet by mouth daily. 01/30/14    Historical Provider, MD  lisinopril-hydrochlorothiazide (PRINZIDE,ZESTORETIC) 10-12.5 MG per tablet Take 1 tablet by mouth daily. 01/30/14   Historical Provider, MD  naproxen (NAPROSYN) 500 MG tablet Take 1 tablet (500 mg total) by mouth 2 (two) times daily with a meal. As needed for pain 02/20/15   Dorie Rank, MD  omeprazole (PRILOSEC) 20 MG capsule Take 20 mg by mouth daily.  04/17/14   Historical Provider, MD  promethazine (PHENERGAN) 25 MG tablet Take 1 tablet (25 mg total) by mouth every 8 (eight) hours as needed for nausea or vomiting. 07/08/14   Nehemiah Settle, NP  QUEtiapine (SEROQUEL) 300 MG tablet Take 150-300 mg by mouth 2 (two) times daily. Take 150 mg every morning and 300 mg at bedtime    Historical Provider, MD  traMADol (ULTRAM) 50 MG tablet Take 1 tablet (50 mg total) by mouth every 6 (six) hours as needed. 01/26/15   Hope Bunnie Pion, NP   BP 140/96 mmHg  Pulse 89  Temp(Src) 98.3 F (36.8 C) (Oral)  Resp 16  SpO2 99%  LMP 06/10/2015 Physical Exam  Constitutional: She is oriented to person, place, and time. She appears well-developed and well-nourished. No distress.  HENT:  Head: Normocephalic and atraumatic.  Eyes: Conjunctivae are normal.  Neck: Normal range of motion. Neck supple.  Cardiovascular: Normal rate, regular rhythm and normal heart sounds.   Pulmonary/Chest: Effort normal and breath sounds normal. No respiratory distress. She has no wheezes.  Abdominal: Soft. Bowel sounds are normal. There is tenderness. There is no rebound and no guarding.  TTP across lower abdomen, no rebound, no guarding.   Genitourinary: Cervix exhibits no motion tenderness. Right adnexum displays no mass, no tenderness and no fullness. Left adnexum displays no mass, no tenderness and no fullness.  Small amount of blood coming from the cervical os  Musculoskeletal: Normal range of motion.  Neurological: She is alert and oriented to person, place, and time. Coordination normal.  Skin: Skin is  warm.  Psychiatric: She has a normal mood and affect. Her behavior is normal.  Nursing note and vitals reviewed.  ED Course  Procedures  DIAGNOSTIC STUDIES: Oxygen Saturation is 99% on RA, normal by my interpretation.    COORDINATION OF CARE: 5:36 PM Discussed treatment plan which includes to perform a pelvic exam with pt. Pt acknowledges and agrees to plan.   Labs Review Labs Reviewed  WET PREP, GENITAL - Abnormal; Notable for the following:    Clue Cells Wet Prep HPF POC PRESENT (*)    WBC, Wet Prep HPF POC FEW (*)    All other components within normal limits  CBC WITH DIFFERENTIAL/PLATELET - Abnormal; Notable for the following:    Hemoglobin 11.7 (*)    HCT 35.6 (*)    All other components within normal limits  COMPREHENSIVE METABOLIC PANEL - Abnormal; Notable for the following:  Creatinine, Ser 1.28 (*)    GFR calc non Af Amer 56 (*)    All other components within normal limits  URINALYSIS, ROUTINE W REFLEX MICROSCOPIC (NOT AT [facility]) - Abnormal; Notable for the following:    Color, Urine AMBER (*)    APPearance HAZY (*)    Hgb urine dipstick LARGE (*)    Protein, ur 30 (*)    Leukocytes, UA SMALL (*)    All other components within normal limits  URINE MICROSCOPIC-ADD ON - Abnormal; Notable for the following:    Squamous Epithelial / LPF 6-30 (*)    Bacteria, UA FEW (*)    All other components within normal limits  URINE CULTURE  POC URINE PREG, ED  GC/CHLAMYDIA PROBE AMP ([place]) NOT AT [facility]    Imaging Review No results found. I have personally reviewed and evaluated these images and lab results as part of my medical decision-making.   MDM   Final diagnoses:  None  Patient presents today with a chief complaint of abdominal cramping and vaginal bleeding.  VSS.  Labs including hemoglobin unremarkable.  Urine pregnancy is negative.  No significant pain with pelvic exam.  Only small amount of blood visualized in the vaginal vault.  Patient reports nausea, but  tolerating PO liquids in the ED.  Feel that the patient is stable for discharge.  Instructed patient to follow up with Gyn.  Return precautions given.  I personally performed the services described in this documentation, which was scribed in my presence. The recorded information has been reviewed and is accurate.    Hyman Bible, PA-C 06/10/15 [place], MD 06/11/15 [phone]

## 2015-06-11 LAB — GC/CHLAMYDIA PROBE AMP (~~LOC~~) NOT AT ARMC
Chlamydia: NEGATIVE
Neisseria Gonorrhea: NEGATIVE

## 2015-06-11 LAB — URINE CULTURE

## 2015-06-13 ENCOUNTER — Encounter (HOSPITAL_COMMUNITY): Payer: Self-pay

## 2015-06-13 ENCOUNTER — Emergency Department (HOSPITAL_COMMUNITY)
Admission: EM | Admit: 2015-06-13 | Discharge: 2015-06-13 | Disposition: A | Discharge status: Home / Self Care | Payer: Medicaid Other | Attending: Emergency Medicine | Admitting: Emergency Medicine

## 2015-06-13 DIAGNOSIS — Z791 Long term (current) use of non-steroidal anti-inflammatories (NSAID): Secondary | ICD-10-CM | POA: Insufficient documentation

## 2015-06-13 DIAGNOSIS — Z792 Long term (current) use of antibiotics: Secondary | ICD-10-CM | POA: Insufficient documentation

## 2015-06-13 DIAGNOSIS — F419 Anxiety disorder, unspecified: Secondary | ICD-10-CM | POA: Diagnosis not present

## 2015-06-13 DIAGNOSIS — F4310 Post-traumatic stress disorder, unspecified: Secondary | ICD-10-CM | POA: Diagnosis not present

## 2015-06-13 DIAGNOSIS — R0602 Shortness of breath: Secondary | ICD-10-CM | POA: Insufficient documentation

## 2015-06-13 DIAGNOSIS — F329 Major depressive disorder, single episode, unspecified: Secondary | ICD-10-CM | POA: Diagnosis not present

## 2015-06-13 DIAGNOSIS — Z8619 Personal history of other infectious and parasitic diseases: Secondary | ICD-10-CM | POA: Diagnosis not present

## 2015-06-13 DIAGNOSIS — Z79899 Other long term (current) drug therapy: Secondary | ICD-10-CM | POA: Insufficient documentation

## 2015-06-13 DIAGNOSIS — Z87440 Personal history of urinary (tract) infections: Secondary | ICD-10-CM | POA: Diagnosis not present

## 2015-06-13 DIAGNOSIS — Z8742 Personal history of other diseases of the female genital tract: Secondary | ICD-10-CM | POA: Insufficient documentation

## 2015-06-13 DIAGNOSIS — F17210 Nicotine dependence, cigarettes, uncomplicated: Secondary | ICD-10-CM | POA: Insufficient documentation

## 2015-06-13 DIAGNOSIS — G43909 Migraine, unspecified, not intractable, without status migrainosus: Secondary | ICD-10-CM | POA: Insufficient documentation

## 2015-06-13 MED ORDER — LORAZEPAM 1 MG PO TABS
1.0000 mg | ORAL_TABLET | Freq: Once | ORAL | Status: AC
  Administered 2015-06-13: 1 mg via ORAL
  Filled 2015-06-13: qty 1

## 2015-06-13 MED ORDER — LORAZEPAM 1 MG PO TABS: 1.0000 mg | ORAL_TABLET | Freq: Three times a day (TID) | ORAL | Status: DC | PRN

## 2015-06-13 MED ORDER — IBUPROFEN 800 MG PO TABS
800.0000 mg | ORAL_TABLET | Freq: Once | ORAL | Status: AC
  Administered 2015-06-13: 800 mg via ORAL
  Filled 2015-06-13: qty 1

## 2015-06-13 MED ORDER — ACETAMINOPHEN 500 MG PO TABS
1000.0000 mg | ORAL_TABLET | Freq: Once | ORAL | Status: AC
  Administered 2015-06-13: 1000 mg via ORAL
  Filled 2015-06-13: qty 2

## 2015-06-13 NOTE — ED Notes (Signed)
Pt reports intimate partners death by [facility] and sudden death of female who adopted her oldest child, both in the past 2 days.  Already being seen by a mental health professional for major depression.  Pt reports acute onset of anxiety and being unable to sleep due to recent events.  States she is here for something to help her until she can see her PCP on Tues and has next appointment with mental health on the 17th.  Pt tearful.  Denies thoughts of hurting self.

## 2015-06-13 NOTE — ED Notes (Addendum)
Pt reports anxiety and unable to sleep d/t two recent deaths in family in past two days.  Pt tearful at triage. C/o headache.

## 2015-06-13 NOTE — Discharge Instructions (Signed)
Return here as needed.  Follow-up with your primary doctor along with your therapist

## 2015-06-13 NOTE — ED Provider Notes (Signed)
CSN: [id number]     Arrival date & time 06/13/15  1106 History   First MD Initiated Contact with Patient 06/13/15 1127     Chief Complaint  Patient presents with  . Anxiety     (Consider location/radiation/quality/duration/timing/severity/associated sxs/prior Treatment) HPI Patient presents to the Emergency Department with increased anxiety. Patient has a PMH significant history for BPAD, PTSD and anxiety. She has a psychiatrist that she sees regularly to manage these conditions. However, the patient just discovered last night that she lost a friend in a MVA and that her son's grandmother passed away yesterday as well. The patient was unable to sleep last night and has anxiety about not being able to sleep. She denies SI/HI but endorses feeling somewhat depressed, and is here today for something to help her sleep at night. She endorses a tension headache and SOB due to anxiety. She denies nausea, vomiting, diarrhea, chest pain, lightheadedness, dizziness, palpations, fever, chills, night sweats, dysuria.  Past Medical History  Diagnosis Date  . Abnormal Pap smear of cervix   . Gonorrhea   . Chlamydia   . Herpes   . Pregnant state, incidental   . Migraines   . Depression   . Anxiety   . UTI (lower urinary tract infection)   . Preeclampsia   . Ovarian cyst    Past Surgical History  Procedure Laterality Date  . Colposcopy    . Dilation and curettage of uterus    . Tonsillectomy     Family History  Problem Relation Age of Onset  . Other Neg Hx   . Kidney disease Mother   . Cancer Paternal Grandmother    Social History  Substance Use Topics  . Smoking status: Current Every Day Smoker -- 0.50 packs/day    Types: Cigarettes  . Smokeless tobacco: Never Used  . Alcohol Use: No     Comment: occasional   OB History    Gravida Para Term Preterm AB TAB SAB Ectopic Multiple Living   7 4 4  0 3 3 0 0 0 4     Review of Systems All other systems negative except as documented in the  HPI. All pertinent positives and negatives as reviewed in the HPI.   Allergies  Review of patient's allergies indicates no known allergies.  Home Medications   Prior to Admission medications   Medication Sig Start Date End Date Taking? Authorizing Provider  albuterol (PROVENTIL HFA;VENTOLIN HFA) 108 (90 BASE) MCG/ACT inhaler Inhale 1-2 puffs into the lungs every 6 (six) hours as needed for wheezing or shortness of breath.    Historical Provider, MD  ALPRAZolam Duanne Moron) 0.5 MG tablet Take 1 tablet (0.5 mg total) by mouth at bedtime as needed for anxiety. 02/20/15   Dorie Rank, MD  amoxicillin (AMOXIL) 500 MG capsule Take 1 capsule (500 mg total) by mouth 3 (three) times daily. 10/24/14   Fransico Meadow, PA-C  butalbital-acetaminophen-caffeine (FIORICET) [phone] MG tablet Take 1-2 tablets by mouth every 6 (six) hours as needed for headache. 02/20/15 02/20/16  Dorie Rank, MD  HYDROcodone-acetaminophen (NORCO/VICODIN) 5-325 MG per tablet Take 1 tablet by mouth every 6 (six) hours as needed for moderate pain. 10/24/14   Fransico Meadow, PA-C  ibuprofen (ADVIL,MOTRIN) 800 MG tablet Take 1 tablet (800 mg total) by mouth 3 (three) times daily. 06/10/15   Heather Laisure, PA-C  LATUDA 20 MG TABS Take 1 tablet by mouth daily. 01/30/14   Historical Provider, MD  lisinopril-hydrochlorothiazide (PRINZIDE,ZESTORETIC) 10-12.5 MG per tablet Take  1 tablet by mouth daily. 01/30/14   Historical Provider, MD  metroNIDAZOLE (FLAGYL) 500 MG tablet Take 1 tablet (500 mg total) by mouth 2 (two) times daily. 06/10/15   Heather Laisure, PA-C  naproxen (NAPROSYN) 500 MG tablet Take 1 tablet (500 mg total) by mouth 2 (two) times daily with a meal. As needed for pain 02/20/15   Dorie Rank, MD  omeprazole (PRILOSEC) 20 MG capsule Take 20 mg by mouth daily.  04/17/14   Historical Provider, MD  promethazine (PHENERGAN) 25 MG tablet Take 1 tablet (25 mg total) by mouth every 8 (eight) hours as needed for nausea or vomiting. 07/08/14    Nehemiah Settle, NP  QUEtiapine (SEROQUEL) 300 MG tablet Take 150-300 mg by mouth 2 (two) times daily. Take 150 mg every morning and 300 mg at bedtime    Historical Provider, MD  traMADol (ULTRAM) 50 MG tablet Take 1 tablet (50 mg total) by mouth every 6 (six) hours as needed. 01/26/15   Hope Bunnie Pion, NP   BP 150/98 mmHg  Pulse 78  Temp(Src) 98.2 F (36.8 C) (Oral)  Resp 16  Ht 5\' 3"  (1.6 m)  Wt 81.693 kg  BMI 31.91 kg/m2  SpO2 100%  LMP 06/10/2015 Physical Exam  Constitutional: She is oriented to person, place, and time. She appears well-developed and well-nourished. No distress.  HENT:  Head: Normocephalic and atraumatic.  Eyes: Conjunctivae and EOM are normal. Pupils are equal, round, and reactive to light.  Neck: Normal range of motion. Neck supple.  Cardiovascular: Normal rate, regular rhythm and normal heart sounds.  Exam reveals no gallop and no friction rub.   No murmur heard. Pulmonary/Chest: Effort normal and breath sounds normal. No respiratory distress. She has no wheezes. She has no rales. She exhibits no tenderness.  Abdominal: Soft. Bowel sounds are normal. She exhibits no distension and no mass. There is no tenderness. There is no rebound and no guarding.  Musculoskeletal: Normal range of motion.  Neurological: She is alert and oriented to person, place, and time.  Skin: Skin is warm and dry. No rash noted. She is not diaphoretic. No erythema. No pallor.  Psychiatric:  Patient is actively crying, depressed mood and affect.    ED Course  Procedures (including critical care time) Labs Review Labs Reviewed - No data to display  Imaging Review No results found. I have personally reviewed and evaluated these images and lab results as part of my medical decision-making.  Patient has an appointment with her primary care doctor along with her therapist this week with the patient.  Return here as needed.  She will be given tablet days worth of  anti-anxiolytics   Dalia Heading, PA-C 06/13/15 1253  Tanna Furry, MD 06/22/15 2252

## 2015-07-30 ENCOUNTER — Encounter (HOSPITAL_COMMUNITY): Payer: Self-pay | Admitting: Emergency Medicine

## 2015-07-30 ENCOUNTER — Emergency Department (HOSPITAL_COMMUNITY)
Admission: EM | Admit: 2015-07-30 | Discharge: 2015-07-31 | Disposition: A | Discharge status: Home / Self Care | Payer: Medicaid Other | Attending: Emergency Medicine | Admitting: Emergency Medicine

## 2015-07-30 DIAGNOSIS — F329 Major depressive disorder, single episode, unspecified: Secondary | ICD-10-CM | POA: Insufficient documentation

## 2015-07-30 DIAGNOSIS — F17210 Nicotine dependence, cigarettes, uncomplicated: Secondary | ICD-10-CM | POA: Insufficient documentation

## 2015-07-30 DIAGNOSIS — B349 Viral infection, unspecified: Secondary | ICD-10-CM | POA: Diagnosis not present

## 2015-07-30 DIAGNOSIS — R197 Diarrhea, unspecified: Secondary | ICD-10-CM

## 2015-07-30 DIAGNOSIS — J040 Acute laryngitis: Secondary | ICD-10-CM | POA: Diagnosis not present

## 2015-07-30 DIAGNOSIS — Z8679 Personal history of other diseases of the circulatory system: Secondary | ICD-10-CM | POA: Insufficient documentation

## 2015-07-30 DIAGNOSIS — Z79899 Other long term (current) drug therapy: Secondary | ICD-10-CM | POA: Insufficient documentation

## 2015-07-30 DIAGNOSIS — J029 Acute pharyngitis, unspecified: Secondary | ICD-10-CM

## 2015-07-30 DIAGNOSIS — R110 Nausea: Secondary | ICD-10-CM | POA: Diagnosis present

## 2015-07-30 DIAGNOSIS — Z87440 Personal history of urinary (tract) infections: Secondary | ICD-10-CM | POA: Insufficient documentation

## 2015-07-30 DIAGNOSIS — Z8742 Personal history of other diseases of the female genital tract: Secondary | ICD-10-CM | POA: Insufficient documentation

## 2015-07-30 DIAGNOSIS — F419 Anxiety disorder, unspecified: Secondary | ICD-10-CM | POA: Diagnosis not present

## 2015-07-30 DIAGNOSIS — R1110 Vomiting, unspecified: Secondary | ICD-10-CM

## 2015-07-30 NOTE — ED Notes (Signed)
Pt states she has had a sore throat, nausea, emesis, and diarrhea x 3 days. Pt states today she has developed a sore throat. Pt states that she had had 2 episodes of emesis today and 5 episodes of diarrhea today.

## 2015-07-31 LAB — RAPID STREP SCREEN (MED CTR MEBANE ONLY): Streptococcus, Group A Screen (Direct): NEGATIVE

## 2015-07-31 MED ORDER — NAPROXEN 500 MG PO TABS
500.0000 mg | ORAL_TABLET | Freq: Once | ORAL | Status: AC
  Administered 2015-07-31: 500 mg via ORAL
  Filled 2015-07-31: qty 1

## 2015-07-31 MED ORDER — HYDROCODONE-ACETAMINOPHEN 7.5-325 MG/15ML PO SOLN: 15.0000 mL | Freq: Three times a day (TID) | ORAL | Status: DC | PRN

## 2015-07-31 MED ORDER — BENZONATATE 100 MG PO CAPS
100.0000 mg | ORAL_CAPSULE | Freq: Once | ORAL | Status: AC
  Administered 2015-07-31: 100 mg via ORAL
  Filled 2015-07-31: qty 1

## 2015-07-31 MED ORDER — BENZONATATE 100 MG PO CAPS: 100.0000 mg | ORAL_CAPSULE | Freq: Three times a day (TID) | ORAL | Status: DC | PRN

## 2015-07-31 MED ORDER — HYDROCODONE-ACETAMINOPHEN 7.5-325 MG/15ML PO SOLN
10.0000 mL | Freq: Once | ORAL | Status: AC
  Administered 2015-07-31: 10 mL via ORAL
  Filled 2015-07-31: qty 15

## 2015-07-31 MED ORDER — DEXAMETHASONE SODIUM PHOSPHATE 10 MG/ML IJ SOLN
10.0000 mg | Freq: Once | INTRAMUSCULAR | Status: AC
  Administered 2015-07-31: 10 mg via INTRAMUSCULAR
  Filled 2015-07-31: qty 1

## 2015-07-31 MED ORDER — FLUTICASONE PROPIONATE 50 MCG/ACT NA SUSP
2.0000 | Freq: Every day | NASAL | Status: DC
  Administered 2015-07-31: 2 via NASAL
  Filled 2015-07-31: qty 16

## 2015-07-31 MED ORDER — NAPROXEN 500 MG PO TABS: 500.0000 mg | ORAL_TABLET | Freq: Two times a day (BID) | ORAL | Status: DC

## 2015-07-31 MED ORDER — PROMETHAZINE HCL 25 MG PO TABS: 25.0000 mg | ORAL_TABLET | Freq: Four times a day (QID) | ORAL | Status: DC | PRN

## 2015-07-31 NOTE — ED Provider Notes (Signed)
CSN: [id number]     Arrival date & time 07/30/15  2341 History   First MD Initiated Contact with Patient 07/31/15 0037     Chief Complaint  Patient presents with  . Sore Throat  . Nausea     (Consider location/radiation/quality/duration/timing/severity/associated sxs/prior Treatment) HPI Comments: 30 year old female with a history of STI's, migraines, depression and anxiety, and urinary tract infections presents to the emergency department for evaluation of sore throat. Patient states that she initially noticed pain on the right side of her throat which worsened last night. She states that she has lost her voice over the past 24 hours since symptoms worsened. Symptoms preceded by 4-5 days of nasal congestion and clear rhinorrhea. Patient has also had an associated cough with phlegm, though the cough has not been productive because the patient reports swallowing her phlegm. Patient has had 2 episodes of nonbloody, nonbilious emesis today. She does report looser stool over the past 2 days. Patient taking Tylenol Cold and flu without relief. She denies fever, syncope, drooling, inability to swallow. No reported sick contacts.  The history is provided by the patient. No language interpreter was used.    Past Medical History  Diagnosis Date  . Abnormal Pap smear of cervix   . Gonorrhea   . Chlamydia   . Herpes   . Pregnant state, incidental   . Migraines   . Depression   . Anxiety   . UTI (lower urinary tract infection)   . Preeclampsia   . Ovarian cyst    Past Surgical History  Procedure Laterality Date  . Colposcopy    . Dilation and curettage of uterus    . Tonsillectomy     Family History  Problem Relation Age of Onset  . Other Neg Hx   . Kidney disease Mother   . Cancer Paternal Grandmother    Social History  Substance Use Topics  . Smoking status: Current Every Day Smoker -- 0.50 packs/day    Types: Cigarettes  . Smokeless tobacco: Never Used  . Alcohol Use: No      Comment: occasional   OB History    Gravida Para Term Preterm AB TAB SAB Ectopic Multiple Living   7 4 4  0 3 3 0 0 0 4      Review of Systems  Constitutional: Negative for fever.  HENT: Positive for congestion, postnasal drip, rhinorrhea, sore throat and voice change. Negative for drooling and trouble swallowing.   Respiratory: Positive for cough. Negative for shortness of breath.   Gastrointestinal: Positive for vomiting and diarrhea.  All other systems reviewed and are negative.   Allergies  Review of patient's allergies indicates no known allergies.  Home Medications   Prior to Admission medications   Medication Sig Start Date End Date Taking? Authorizing Provider  citalopram (CELEXA) 40 MG tablet Take 40 mg by mouth daily as needed (mood).  05/17/15  Yes Historical Provider, MD  lisinopril-hydrochlorothiazide (PRINZIDE,ZESTORETIC) 10-12.5 MG per tablet Take 1 tablet by mouth daily. 01/30/14  Yes Historical Provider, MD  Phenylephrine-DM-GG-APAP (TYLENOL COLD/FLU SEVERE PO) Take 30 mLs by mouth every 6 (six) hours as needed (cold/flu symptoms).   Yes Historical Provider, MD  QUEtiapine (SEROQUEL) 400 MG tablet Take 400 mg by mouth at bedtime. 07/20/15  Yes Historical Provider, MD  valACYclovir (VALTREX) 500 MG tablet Take 500 mg by mouth 2 (two) times daily. 07/27/15  Yes Historical Provider, MD  ALPRAZolam Duanne Moron) 0.5 MG tablet Take 1 tablet (0.5 mg total) by mouth at  bedtime as needed for anxiety. Patient not taking: Reported on 07/31/2015 02/20/15   Dorie Rank, MD  benzonatate (TESSALON) 100 MG capsule Take 1 capsule (100 mg total) by mouth 3 (three) times daily as needed for cough. 07/31/15   Antonietta Breach, PA-C  butalbital-acetaminophen-caffeine (FIORICET) [phone] MG tablet Take 1-2 tablets by mouth every 6 (six) hours as needed for headache. Patient not taking: Reported on 07/31/2015 02/20/15 02/20/16  Dorie Rank, MD  HYDROcodone-acetaminophen (HYCET) 7.5-325 mg/15 ml solution Take 15  mLs by mouth every 8 (eight) hours as needed (sore throat). 07/31/15   Antonietta Breach, PA-C  ibuprofen (ADVIL,MOTRIN) 800 MG tablet Take 1 tablet (800 mg total) by mouth 3 (three) times daily. Patient not taking: Reported on 07/31/2015 06/10/15   Hyman Bible, PA-C  LORazepam (ATIVAN) 1 MG tablet Take 1 tablet (1 mg total) by mouth every 8 (eight) hours as needed for anxiety. Patient not taking: Reported on 07/31/2015 06/13/15   Dalia Heading, PA-C  metroNIDAZOLE (FLAGYL) 500 MG tablet Take 1 tablet (500 mg total) by mouth 2 (two) times daily. Patient not taking: Reported on 07/31/2015 06/10/15   Hyman Bible, PA-C  naproxen (NAPROSYN) 500 MG tablet Take 1 tablet (500 mg total) by mouth 2 (two) times daily. 07/31/15   Antonietta Breach, PA-C  promethazine (PHENERGAN) 25 MG tablet Take 1 tablet (25 mg total) by mouth every 6 (six) hours as needed for nausea or vomiting. 07/31/15   Antonietta Breach, PA-C   BP 124/76 mmHg  Pulse 84  Temp(Src) 98.2 F (36.8 C) (Oral)  Resp 18  SpO2 98%  LMP 07/29/2015   Physical Exam  Constitutional: She is oriented to person, place, and time. She appears well-developed and well-nourished. No distress.  Nontoxic/nonseptic appearing  HENT:  Head: Normocephalic and atraumatic.  Mouth/Throat: No oropharyngeal exudate.  Mild posterior oropharyngeal erythema without exudates. Tonsils absent. No edema. Uvula midline. Patient tolerating secretions without difficulty.  Eyes: Conjunctivae and EOM are normal. No scleral icterus.  Neck: Normal range of motion.  No nuchal rigidity or meningismus  Cardiovascular: Normal rate, regular rhythm and intact distal pulses.   Pulmonary/Chest: Effort normal and breath sounds normal. No respiratory distress. She has no wheezes. She has no rales.  Respirations even and unlabored. Lungs clear to auscultation bilaterally.  Abdominal: Soft. She exhibits no distension. There is no tenderness.  Soft, nontender abdomen. No rigidity, masses, or peritoneal  signs.  Musculoskeletal: Normal range of motion.  Neurological: She is alert and oriented to person, place, and time. She exhibits normal muscle tone. Coordination normal.  Skin: Skin is warm and dry. No rash noted. She is not diaphoretic. No erythema. No pallor.  Psychiatric: She has a normal mood and affect. Her behavior is normal.  Nursing note and vitals reviewed.   ED Course  Procedures (including critical care time) Labs Review Labs Reviewed  RAPID STREP SCREEN (NOT AT [facility])  CULTURE, GROUP A STREP [facility])    Imaging Review No results found.   I have personally reviewed and evaluated these images and lab results as part of my medical decision-making.   EKG Interpretation None       Medications  dexamethasone (DECADRON) injection 10 mg (10 mg Intramuscular Given 07/31/15 0241)  HYDROcodone-acetaminophen (HYCET) 7.5-325 mg/15 ml solution 10 mL (10 mLs Oral Given 07/31/15 0241)  naproxen (NAPROSYN) tablet 500 mg (500 mg Oral Given 07/31/15 0240)  benzonatate (TESSALON) capsule 100 mg (100 mg Oral Given 07/31/15 0240)    MDM   Final diagnoses:  Viral syndrome  Pharyngitis  Laryngitis  Vomiting and diarrhea    Patient with symptoms consistent with viral illness. Vitals are stable, patient afebrile. No signs of dehydration, tolerating PO's and secretions. Lungs are clear. Patient will be discharged with instructions to orally hydrate, rest, and use over-the-counter medications for symptom management. Patient will also be given a cough suppressant. Primary careful advised and return precautions given. Patient discharged in satisfactory condition with no unaddressed concerns.   Filed Vitals:   07/30/15 2349 07/31/15 0243  BP: 127/98 124/76  Pulse: 85 84  Temp: 98.2 F (36.8 C)   Resp: 18 [address], PA-C 08/01/15 [place], MD 08/04/15 1729

## 2015-07-31 NOTE — Discharge Instructions (Signed)
Viral Infections °A viral infection can be caused by different types of viruses. Most viral infections are not serious and resolve on their own. However, some infections may cause severe symptoms and may lead to further complications. °SYMPTOMS °Viruses can frequently cause: °· Minor sore throat. °· Aches and pains. °· Headaches. °· Runny nose. °· Different types of rashes. °· Watery eyes. °· Tiredness. °· Cough. °· Loss of appetite. °· Gastrointestinal infections, resulting in nausea, vomiting, and diarrhea. °These symptoms do not respond to antibiotics because the infection is not caused by bacteria. However, you might catch a bacterial infection following the viral infection. This is sometimes called a "superinfection." Symptoms of such a bacterial infection may include: °· Worsening sore throat with pus and difficulty swallowing. °· Swollen neck glands. °· Chills and a high or persistent fever. °· Severe headache. °· Tenderness over the sinuses. °· Persistent overall ill feeling (malaise), muscle aches, and tiredness (fatigue). °· Persistent cough. °· Yellow, green, or brown mucus production with coughing. °HOME CARE INSTRUCTIONS  °· Only take over-the-counter or prescription medicines for pain, discomfort, diarrhea, or fever as directed by your caregiver. °· Drink enough water and fluids to keep your urine clear or pale yellow. Sports drinks can provide valuable electrolytes, sugars, and hydration. °· Get plenty of rest and maintain proper nutrition. Soups and broths with crackers or rice are fine. °SEEK IMMEDIATE MEDICAL CARE IF:  °· You have severe headaches, shortness of breath, chest pain, neck pain, or an unusual rash. °· You have uncontrolled vomiting, diarrhea, or you are unable to keep down fluids. °· You or your child has an oral temperature above 102° F (38.9° C), not controlled by medicine. °· Your baby is older than 3 months with a rectal temperature of 102° F (38.9° C) or higher. °· Your baby is 3  months old or younger with a rectal temperature of 100.4° F (38° C) or higher. °MAKE SURE YOU:  °· Understand these instructions. °· Will watch your condition. °· Will get help right away if you are not doing well or get worse. °  °This information is not intended to replace advice given to you by your health care provider. Make sure you discuss any questions you have with your health care provider. °  °Document Released: 01/25/2005 Document Revised: 07/10/2011 Document Reviewed: 09/23/2014 °Elsevier Interactive Patient Education ©2016 [employer] ° °

## 2015-08-02 LAB — CULTURE, GROUP A STREP (THRC)

## 2015-09-13 ENCOUNTER — Emergency Department (HOSPITAL_COMMUNITY)
Admission: EM | Admit: 2015-09-13 | Discharge: 2015-09-13 | Disposition: A | Discharge status: Home / Self Care | Payer: Medicaid Other | Attending: Emergency Medicine | Admitting: Emergency Medicine

## 2015-09-13 ENCOUNTER — Encounter (HOSPITAL_COMMUNITY): Payer: Self-pay

## 2015-09-13 ENCOUNTER — Emergency Department (HOSPITAL_COMMUNITY): Payer: Medicaid Other

## 2015-09-13 DIAGNOSIS — Z8619 Personal history of other infectious and parasitic diseases: Secondary | ICD-10-CM | POA: Insufficient documentation

## 2015-09-13 DIAGNOSIS — Z8679 Personal history of other diseases of the circulatory system: Secondary | ICD-10-CM | POA: Diagnosis not present

## 2015-09-13 DIAGNOSIS — F329 Major depressive disorder, single episode, unspecified: Secondary | ICD-10-CM | POA: Insufficient documentation

## 2015-09-13 DIAGNOSIS — R1031 Right lower quadrant pain: Secondary | ICD-10-CM | POA: Diagnosis present

## 2015-09-13 DIAGNOSIS — F419 Anxiety disorder, unspecified: Secondary | ICD-10-CM | POA: Insufficient documentation

## 2015-09-13 DIAGNOSIS — R102 Pelvic and perineal pain: Secondary | ICD-10-CM | POA: Diagnosis not present

## 2015-09-13 DIAGNOSIS — Z87440 Personal history of urinary (tract) infections: Secondary | ICD-10-CM | POA: Diagnosis not present

## 2015-09-13 DIAGNOSIS — R300 Dysuria: Secondary | ICD-10-CM | POA: Insufficient documentation

## 2015-09-13 DIAGNOSIS — Z79899 Other long term (current) drug therapy: Secondary | ICD-10-CM | POA: Insufficient documentation

## 2015-09-13 DIAGNOSIS — Z3202 Encounter for pregnancy test, result negative: Secondary | ICD-10-CM | POA: Insufficient documentation

## 2015-09-13 DIAGNOSIS — Z791 Long term (current) use of non-steroidal anti-inflammatories (NSAID): Secondary | ICD-10-CM | POA: Diagnosis not present

## 2015-09-13 DIAGNOSIS — F17210 Nicotine dependence, cigarettes, uncomplicated: Secondary | ICD-10-CM | POA: Insufficient documentation

## 2015-09-13 DIAGNOSIS — K2970 Gastritis, unspecified, without bleeding: Secondary | ICD-10-CM | POA: Diagnosis not present

## 2015-09-13 DIAGNOSIS — N898 Other specified noninflammatory disorders of vagina: Secondary | ICD-10-CM | POA: Insufficient documentation

## 2015-09-13 LAB — CBC
HCT: 37.4 % (ref 36.0–46.0)
HEMOGLOBIN: 12.3 g/dL (ref 12.0–15.0)
MCH: 28.3 pg (ref 26.0–34.0)
MCHC: 32.9 g/dL (ref 30.0–36.0)
MCV: 86 fL (ref 78.0–100.0)
PLATELETS: 247 10*3/uL (ref 150–400)
RBC: 4.35 MIL/uL (ref 3.87–5.11)
RDW: 13.6 % (ref 11.5–15.5)
WBC: 7.7 10*3/uL (ref 4.0–10.5)

## 2015-09-13 LAB — COMPREHENSIVE METABOLIC PANEL
ALK PHOS: 39 U/L (ref 38–126)
ALT: 28 U/L (ref 14–54)
ANION GAP: 10 (ref 5–15)
AST: 21 U/L (ref 15–41)
Albumin: 3.6 g/dL (ref 3.5–5.0)
BILIRUBIN TOTAL: 0.5 mg/dL (ref 0.3–1.2)
BUN: 16 mg/dL (ref 6–20)
CALCIUM: 9.1 mg/dL (ref 8.9–10.3)
CO2: 25 mmol/L (ref 22–32)
CREATININE: 1.26 mg/dL — AB (ref 0.44–1.00)
Chloride: 104 mmol/L (ref 101–111)
GFR, EST NON AFRICAN AMERICAN: 57 mL/min — AB (ref >60)
Glucose, Bld: 103 mg/dL — ABNORMAL HIGH (ref 65–99)
Potassium: 4 mmol/L (ref 3.5–5.1)
SODIUM: 139 mmol/L (ref 135–145)
TOTAL PROTEIN: 6.8 g/dL (ref 6.5–8.1)

## 2015-09-13 LAB — WET PREP, GENITAL
Sperm: NONE SEEN
TRICH WET PREP: NONE SEEN
Yeast Wet Prep HPF POC: NONE SEEN

## 2015-09-13 LAB — URINALYSIS, ROUTINE W REFLEX MICROSCOPIC
BILIRUBIN URINE: NEGATIVE
Glucose, UA: NEGATIVE mg/dL
Hgb urine dipstick: NEGATIVE
Ketones, ur: NEGATIVE mg/dL
NITRITE: NEGATIVE
PROTEIN: NEGATIVE mg/dL
SPECIFIC GRAVITY, URINE: 1.026 (ref 1.005–1.030)
pH: 5.5 (ref 5.0–8.0)

## 2015-09-13 LAB — URINE MICROSCOPIC-ADD ON

## 2015-09-13 LAB — I-STAT BETA HCG BLOOD, ED (MC, WL, AP ONLY)

## 2015-09-13 MED ORDER — LIDOCAINE HCL (PF) 1 % IJ SOLN
2.0000 mL | Freq: Once | INTRAMUSCULAR | Status: AC
Start: 2015-09-13 — End: 2015-09-13
  Administered 2015-09-13: 2 mL
  Filled 2015-09-13: qty 5

## 2015-09-13 MED ORDER — HYDROCODONE-ACETAMINOPHEN 5-325 MG PO TABS
1.0000 | ORAL_TABLET | Freq: Once | ORAL | Status: AC
  Administered 2015-09-13: 1 via ORAL
  Filled 2015-09-13: qty 1

## 2015-09-13 MED ORDER — RANITIDINE HCL 150 MG PO TABS: 150.0000 mg | ORAL_TABLET | Freq: Two times a day (BID) | ORAL | Status: DC

## 2015-09-13 MED ORDER — AZITHROMYCIN 250 MG PO TABS
1000.0000 mg | ORAL_TABLET | Freq: Once | ORAL | Status: AC
  Administered 2015-09-13: 1000 mg via ORAL
  Filled 2015-09-13: qty 4

## 2015-09-13 MED ORDER — ONDANSETRON 4 MG PO TBDP
4.0000 mg | ORAL_TABLET | Freq: Once | ORAL | Status: AC | PRN
Start: 2015-09-13 — End: 2015-09-13
  Administered 2015-09-13: 4 mg via ORAL

## 2015-09-13 MED ORDER — ONDANSETRON 4 MG PO TBDP
ORAL_TABLET | ORAL | Status: AC
  Filled 2015-09-13: qty 1

## 2015-09-13 MED ORDER — CEFTRIAXONE SODIUM 250 MG IJ SOLR
250.0000 mg | Freq: Once | INTRAMUSCULAR | Status: AC
  Administered 2015-09-13: 250 mg via INTRAMUSCULAR
  Filled 2015-09-13: qty 250

## 2015-09-13 MED ORDER — HYDROCODONE-ACETAMINOPHEN 5-325 MG PO TABS: 1.0000 | ORAL_TABLET | Freq: Four times a day (QID) | ORAL | Status: DC | PRN

## 2015-09-13 NOTE — Discharge Instructions (Signed)

## 2015-09-13 NOTE — ED Notes (Signed)
PT c/o abd pain c nausea x 3-4 days. Pt states she also has vaginal discharge and has had some dizziness. Pt states she has vomited x3 in the last 24 hrs. Pt has not been checking her temperature at home.

## 2015-09-13 NOTE — ED Provider Notes (Addendum)
CSN: [id number]     Arrival date & time 09/13/15  1311 History   First MD Initiated Contact with Patient 09/13/15 1508     Chief Complaint  Patient presents with  . Abdominal Pain     (Consider location/radiation/quality/duration/timing/severity/associated sxs/prior Treatment) Patient is a 30 y.o. female presenting with abdominal pain. The history is provided by the patient.  Abdominal Pain Pain location:  RLQ Pain quality: cramping, heavy and shooting   Pain radiates to:  Does not radiate Pain severity:  Moderate Onset quality:  Gradual Duration: 4-5 days. Timing:  Constant Progression:  Worsening Chronicity:  New Context: recent sexual activity   Context: not recent illness and not sick contacts   Context comment:  Patient has been having irregular periods and approximately 1 week ago finished a heavy cycle that lasted for 2 weeks and the pain started shortly after that Relieved by:  Nothing Worsened by:  Nothing tried Ineffective treatments:  NSAIDs Associated symptoms: anorexia, dysuria, fatigue, nausea and vaginal discharge   Associated symptoms: no cough, no diarrhea, no fever, no shortness of breath, no vaginal bleeding and no vomiting   Associated symptoms comment:  No vaginal lesions but hx of herpes.  Patient has a new sexual partner for the last 2 months and is not using protection. In the last 1 week she has noticed foul-smelling vaginal discharge and has been using new soaps and Burnell Blanks douching Risk factors: NSAID use   Risk factors: has not had multiple surgeries and not pregnant     Past Medical History  Diagnosis Date  . Abnormal Pap smear of cervix   . Gonorrhea   . Chlamydia   . Herpes   . Pregnant state, incidental   . Migraines   . Depression   . Anxiety   . UTI (lower urinary tract infection)   . Preeclampsia   . Ovarian cyst    Past Surgical History  Procedure Laterality Date  . Colposcopy    . Dilation and curettage of uterus    .  Tonsillectomy     Family History  Problem Relation Age of Onset  . Other Neg Hx   . Kidney disease Mother   . Cancer Paternal Grandmother    Social History  Substance Use Topics  . Smoking status: Current Every Day Smoker -- 0.50 packs/day    Types: Cigarettes  . Smokeless tobacco: Never Used  . Alcohol Use: Yes     Comment: occasional   OB History    Gravida Para Term Preterm AB TAB SAB Ectopic Multiple Living   7 4 4  0 3 3 0 0 0 4     Review of Systems  Constitutional: Positive for fatigue. Negative for fever.  Respiratory: Negative for cough and shortness of breath.   Gastrointestinal: Positive for nausea, abdominal pain and anorexia. Negative for vomiting and diarrhea.       Patient for the last month has also been having epigastric discomfort worse with eating certain foods. She has been taking ibuprofen every few days and stop taking her Prilosec 3-4 months ago. Occasionally will have heartburn type symptoms and have a bad taste in her mouth when she wakes up in the morning.  Genitourinary: Positive for dysuria and vaginal discharge. Negative for vaginal bleeding.      Allergies  Review of patient's allergies indicates no known allergies.  Home Medications   Prior to Admission medications   Medication Sig Start Date End Date Taking? Authorizing Provider  ALPRAZolam Duanne Moron)  0.5 MG tablet Take 1 tablet (0.5 mg total) by mouth at bedtime as needed for anxiety. Patient not taking: Reported on 07/31/2015 02/20/15   Dorie Rank, MD  benzonatate (TESSALON) 100 MG capsule Take 1 capsule (100 mg total) by mouth 3 (three) times daily as needed for cough. 07/31/15   Antonietta Breach, PA-C  butalbital-acetaminophen-caffeine (FIORICET) [phone] MG tablet Take 1-2 tablets by mouth every 6 (six) hours as needed for headache. Patient not taking: Reported on 07/31/2015 02/20/15 02/20/16  Dorie Rank, MD  citalopram (CELEXA) 40 MG tablet Take 40 mg by mouth daily as needed (mood).  05/17/15    Historical Provider, MD  HYDROcodone-acetaminophen (HYCET) 7.5-325 mg/15 ml solution Take 15 mLs by mouth every 8 (eight) hours as needed (sore throat). 07/31/15   Antonietta Breach, PA-C  ibuprofen (ADVIL,MOTRIN) 800 MG tablet Take 1 tablet (800 mg total) by mouth 3 (three) times daily. Patient not taking: Reported on 07/31/2015 06/10/15   Hyman Bible, PA-C  lisinopril-hydrochlorothiazide (PRINZIDE,ZESTORETIC) 10-12.5 MG per tablet Take 1 tablet by mouth daily. 01/30/14   Historical Provider, MD  LORazepam (ATIVAN) 1 MG tablet Take 1 tablet (1 mg total) by mouth every 8 (eight) hours as needed for anxiety. Patient not taking: Reported on 07/31/2015 06/13/15   Dalia Heading, PA-C  metroNIDAZOLE (FLAGYL) 500 MG tablet Take 1 tablet (500 mg total) by mouth 2 (two) times daily. Patient not taking: Reported on 07/31/2015 06/10/15   Hyman Bible, PA-C  naproxen (NAPROSYN) 500 MG tablet Take 1 tablet (500 mg total) by mouth 2 (two) times daily. 07/31/15   Antonietta Breach, PA-C  Phenylephrine-DM-GG-APAP (TYLENOL COLD/FLU SEVERE PO) Take 30 mLs by mouth every 6 (six) hours as needed (cold/flu symptoms).    Historical Provider, MD  promethazine (PHENERGAN) 25 MG tablet Take 1 tablet (25 mg total) by mouth every 6 (six) hours as needed for nausea or vomiting. 07/31/15   Antonietta Breach, PA-C  QUEtiapine (SEROQUEL) 400 MG tablet Take 400 mg by mouth at bedtime. 07/20/15   Historical Provider, MD  valACYclovir (VALTREX) 500 MG tablet Take 500 mg by mouth 2 (two) times daily. 07/27/15   Historical Provider, MD   BP 131/87 mmHg  Pulse 86  Temp(Src) 98.8 F (37.1 C) (Oral)  Resp 16  Ht 5\' 3"  (1.6 m)  Wt 164 lb (74.39 kg)  BMI 29.06 kg/m2  SpO2 100%  LMP 08/27/2015 Physical Exam  Constitutional: She is oriented to person, place, and time. She appears well-developed and well-nourished. No distress.  HENT:  Head: Normocephalic and atraumatic.  Mouth/Throat: Oropharynx is clear and moist.  Eyes: Conjunctivae and EOM are  normal. Pupils are equal, round, and reactive to light.  Neck: Normal range of motion. Neck supple.  Cardiovascular: Normal rate, regular rhythm and intact distal pulses.   No murmur heard. Pulmonary/Chest: Effort normal and breath sounds normal. No respiratory distress. She has no wheezes. She has no rales.  Abdominal: Soft. She exhibits no distension. There is tenderness in the right lower quadrant and epigastric area. There is no rebound and no guarding.    Right pelvic tenderness  Genitourinary: Uterus normal. Cervix exhibits discharge. Cervix exhibits no motion tenderness and no friability. Right adnexum displays tenderness and fullness. Left adnexum displays no mass, no tenderness and no fullness. Vaginal discharge found.  Musculoskeletal: Normal range of motion. She exhibits no edema or tenderness.  Neurological: She is alert and oriented to person, place, and time.  Skin: Skin is warm and dry. No rash noted. No erythema.  Psychiatric: She has a normal mood and affect. Her behavior is normal.  Nursing note and vitals reviewed.   ED Course  Procedures (including critical care time) Labs Review Labs Reviewed  WET PREP, GENITAL - Abnormal; Notable for the following:    Clue Cells Wet Prep HPF POC PRESENT (*)    WBC, Wet Prep HPF POC TOO NUMEROUS TO COUNT (*)    All other components within normal limits  COMPREHENSIVE METABOLIC PANEL - Abnormal; Notable for the following:    Glucose, Bld 103 (*)    Creatinine, Ser 1.26 (*)    GFR calc non Af Amer 57 (*)    All other components within normal limits  URINALYSIS, ROUTINE W REFLEX MICROSCOPIC (NOT AT [facility]) - Abnormal; Notable for the following:    APPearance HAZY (*)    Leukocytes, UA SMALL (*)    All other components within normal limits  URINE MICROSCOPIC-ADD ON - Abnormal; Notable for the following:    Squamous Epithelial / LPF 6-30 (*)    Bacteria, UA FEW (*)    All other components within normal limits  CBC  RPR  HIV  ANTIBODY (ROUTINE TESTING)  I-STAT BETA HCG BLOOD, ED ([facility], [facility], [facility] ONLY)  GC/CHLAMYDIA PROBE AMP ([place]) NOT AT [facility]    Imaging Review US Transvaginal Non-ob  09/13/2015  CLINICAL DATA:  Right pelvic pain since 08/28/2015 EXAM: TRANSABDOMINAL AND TRANSVAGINAL ULTRASOUND OF PELVIS TECHNIQUE: Both transabdominal and transvaginal ultrasound examinations of the pelvis were performed. Transabdominal technique was performed for global imaging of the pelvis including uterus, ovaries, adnexal regions, and pelvic cul-de-sac. It was necessary to proceed with endovaginal exam following the transabdominal exam to visualize the endometrium and bilateral ovaries. COMPARISON:  01/27/2014 FINDINGS: Uterus Measurements: 7.8 x 4.0 x 4.5 cm. No fibroids or other mass visualized. Endometrium Thickness: 4 mm.  No focal abnormality visualized. Right ovary Measurements: 4.0 x 1.8 x 2.5 cm. Normal appearance/no adnexal mass. Left ovary Measurements: 3.4 x 2.0 x 2.4 cm. Normal appearance/no adnexal mass. Other findings No abnormal free fluid. IMPRESSION: Negative pelvic ultrasound. Electronically Signed   By: Julian Hy M.D.   On: 09/13/2015 16:57   US Pelvis Complete  09/13/2015  CLINICAL DATA:  Right pelvic pain since 08/28/2015 EXAM: TRANSABDOMINAL AND TRANSVAGINAL ULTRASOUND OF PELVIS TECHNIQUE: Both transabdominal and transvaginal ultrasound examinations of the pelvis were performed. Transabdominal technique was performed for global imaging of the pelvis including uterus, ovaries, adnexal regions, and pelvic cul-de-sac. It was necessary to proceed with endovaginal exam following the transabdominal exam to visualize the endometrium and bilateral ovaries. COMPARISON:  01/27/2014 FINDINGS: Uterus Measurements: 7.8 x 4.0 x 4.5 cm. No fibroids or other mass visualized. Endometrium Thickness: 4 mm.  No focal abnormality visualized. Right ovary Measurements: 4.0 x 1.8 x 2.5 cm. Normal appearance/no adnexal mass. Left  ovary Measurements: 3.4 x 2.0 x 2.4 cm. Normal appearance/no adnexal mass. Other findings No abnormal free fluid. IMPRESSION: Negative pelvic ultrasound. Electronically Signed   By: Julian Hy M.D.   On: 09/13/2015 16:57   I have personally reviewed and evaluated these images and lab results as part of my medical decision-making.   EKG Interpretation   Date/Time:  Monday Sep 13 2015 13:37:16 EDT Ventricular Rate:  85 PR Interval:  132 QRS Duration: 78 QT Interval:  356 QTC Calculation: 423 R Axis:   95 Text Interpretation:  Normal sinus rhythm Rightward axis No significant  change since last tracing Confirmed by Maryan Rued  MD, Dat Derksen ([id number]) on  09/13/2015 3:10:49 PM      MDM   Final diagnoses:  Pelvic pain in female  Gastritis    Patient is an 30 year old female presenting with 3-4 days of right lower quadrant pain, vaginal discharge and nausea. She has a history of STI's in the past including genital herpes that she takes Valtrex for daily. She has had a new sexual partner in the last few months and does not use protection. Patient's last menses was one week ago.  On exam patient has right pelvic pain with a lower suspicion for appendicitis at this time. Pelvic exam was significant right ovarian tenderness and vaginal discharge present.  Concern for STI versus TOA or ovarian cyst. Lower suspicion for ovarian torsion.  HCG is normal, CMP unchanged, CBC within normal limits.  UA, GC chlamydia, HIV, RPR pending. 3:52 PM Wet prep consistent with tntc wbc and few clue cells.  UA without acute findings. U/S pending.  Ultrasound within normal limits. On repeat evaluation patient does not have right lower quadrant pain concerning for appendicitis at this time. She does have too numerous to count white blood cells in her wet prep and with her new sexual contact will be treated with Rocephin and azithromycin. No evidence of PID at this time. [facility]  Secondly patient is complaining of epigastric pain that's been ongoing for the last month. She has been taking ibuprofen intermittently and states she stopped taking Prilosec approximately 3-4 months ago. It is associated with eating certain foods and appears to be reflux based on symptoms and prior history.    Blanchie Dessert, MD 09/13/15 1807  Blanchie Dessert, MD 09/13/15 [place], MD 09/13/15 [id number]

## 2015-09-14 LAB — RPR: RPR Ser Ql: NONREACTIVE

## 2015-09-14 LAB — GC/CHLAMYDIA PROBE AMP (~~LOC~~) NOT AT ARMC
CHLAMYDIA, DNA PROBE: NEGATIVE
Neisseria Gonorrhea: NEGATIVE

## 2015-09-14 LAB — HIV ANTIBODY (ROUTINE TESTING W REFLEX): HIV SCREEN 4TH GENERATION: NONREACTIVE

## 2015-10-13 ENCOUNTER — Ambulatory Visit (INDEPENDENT_AMBULATORY_CARE_PROVIDER_SITE_OTHER): Payer: Medicaid Other | Admitting: Obstetrics & Gynecology

## 2015-10-13 ENCOUNTER — Encounter: Payer: Self-pay | Admitting: Obstetrics & Gynecology

## 2015-10-13 ENCOUNTER — Other Ambulatory Visit (HOSPITAL_COMMUNITY)
Admission: RE | Admit: 2015-10-13 | Discharge: 2015-10-13 | Disposition: A | Discharge status: Home / Self Care | Payer: Medicaid Other | Source: Ambulatory Visit | Attending: Obstetrics & Gynecology | Admitting: Obstetrics & Gynecology

## 2015-10-13 VITALS — BP 138/84 | HR 86 | Ht 62.0 in | Wt 167.3 lb

## 2015-10-13 DIAGNOSIS — Z01419 Encounter for gynecological examination (general) (routine) without abnormal findings: Secondary | ICD-10-CM | POA: Insufficient documentation

## 2015-10-13 DIAGNOSIS — N946 Dysmenorrhea, unspecified: Secondary | ICD-10-CM | POA: Diagnosis not present

## 2015-10-13 DIAGNOSIS — Z124 Encounter for screening for malignant neoplasm of cervix: Secondary | ICD-10-CM | POA: Diagnosis not present

## 2015-10-13 DIAGNOSIS — Z0000 Encounter for general adult medical examination without abnormal findings: Secondary | ICD-10-CM | POA: Diagnosis not present

## 2015-10-13 DIAGNOSIS — Z113 Encounter for screening for infections with a predominantly sexual mode of transmission: Secondary | ICD-10-CM | POA: Insufficient documentation

## 2015-10-13 MED ORDER — LEVONORGEST-ETH ESTRAD 91-DAY 0.15-0.03 &0.01 MG PO TABS: 1.0000 | ORAL_TABLET | Freq: Every day | ORAL | Status: DC

## 2015-10-13 MED ORDER — METRONIDAZOLE 500 MG PO TABS: 500.0000 mg | ORAL_TABLET | Freq: Two times a day (BID) | ORAL | Status: DC

## 2015-10-13 NOTE — Progress Notes (Signed)
   Subjective:    Patient ID: Robin Horn, female    DOB: [birth year], 30 y.o.   MRN: [id number]  HPI 30 yo S AA P4(12, 76, 41, and 107 yo kids) here for evaluation of her periods. They come every monthly in general, last anywhere from 2 to 7 days. Very painful and not really heavy. U/S entirely normal last month.   Review of Systems Current sex partner for a month, not using condoms. No contraception for 4 years. She does not want a pregnancy. Homemaker Denies dyspareunia    Objective:   Physical Exam [id number] Breathing, conversing, and ambulating normally + odor c/w BV Discharge c/w BV NSSR, mobile, somewhat tender No adnexal masses or tenderness       Assessment & Plan:  Preventative care-pap and STI testing . REC condoms Painful periods- She is currently taking Norco prn (prescribed by Dr. Noah Delaine) She says that she cannot take IBU due to cysts on her kidneys

## 2015-10-14 LAB — HEPATITIS C ANTIBODY: HCV AB: NEGATIVE

## 2015-10-14 LAB — RPR

## 2015-10-14 LAB — HIV ANTIBODY (ROUTINE TESTING W REFLEX): HIV: NONREACTIVE

## 2015-10-14 LAB — HEPATITIS B SURFACE ANTIGEN: Hepatitis B Surface Ag: NEGATIVE

## 2015-10-14 LAB — CYTOLOGY - PAP

## 2015-10-19 ENCOUNTER — Telehealth: Payer: Self-pay

## 2015-10-19 NOTE — Telephone Encounter (Signed)
Received call on nurse line patient would like to know her STI results. According to what I can see all labs were written normal range.

## 2015-11-27 ENCOUNTER — Emergency Department (HOSPITAL_COMMUNITY): Payer: Medicaid Other

## 2015-11-27 ENCOUNTER — Encounter (HOSPITAL_COMMUNITY): Payer: Self-pay

## 2015-11-27 ENCOUNTER — Emergency Department (HOSPITAL_COMMUNITY)
Admission: EM | Admit: 2015-11-27 | Discharge: 2015-11-27 | Disposition: A | Discharge status: Home / Self Care | Payer: Medicaid Other | Attending: Emergency Medicine | Admitting: Emergency Medicine

## 2015-11-27 DIAGNOSIS — N760 Acute vaginitis: Secondary | ICD-10-CM | POA: Insufficient documentation

## 2015-11-27 DIAGNOSIS — R102 Pelvic and perineal pain: Secondary | ICD-10-CM | POA: Insufficient documentation

## 2015-11-27 DIAGNOSIS — F17210 Nicotine dependence, cigarettes, uncomplicated: Secondary | ICD-10-CM | POA: Insufficient documentation

## 2015-11-27 DIAGNOSIS — F329 Major depressive disorder, single episode, unspecified: Secondary | ICD-10-CM | POA: Diagnosis not present

## 2015-11-27 DIAGNOSIS — Z79899 Other long term (current) drug therapy: Secondary | ICD-10-CM | POA: Diagnosis not present

## 2015-11-27 DIAGNOSIS — J029 Acute pharyngitis, unspecified: Secondary | ICD-10-CM | POA: Insufficient documentation

## 2015-11-27 DIAGNOSIS — Z791 Long term (current) use of non-steroidal anti-inflammatories (NSAID): Secondary | ICD-10-CM | POA: Diagnosis not present

## 2015-11-27 DIAGNOSIS — B9689 Other specified bacterial agents as the cause of diseases classified elsewhere: Secondary | ICD-10-CM

## 2015-11-27 LAB — URINE MICROSCOPIC-ADD ON: RBC / HPF: NONE SEEN RBC/hpf (ref 0–5)

## 2015-11-27 LAB — URINALYSIS, ROUTINE W REFLEX MICROSCOPIC
Bilirubin Urine: NEGATIVE
Glucose, UA: NEGATIVE mg/dL
Hgb urine dipstick: NEGATIVE
KETONES UR: NEGATIVE mg/dL
NITRITE: NEGATIVE
PROTEIN: NEGATIVE mg/dL
Specific Gravity, Urine: 1.023 (ref 1.005–1.030)
pH: 5.5 (ref 5.0–8.0)

## 2015-11-27 LAB — WET PREP, GENITAL
SPERM: NONE SEEN
TRICH WET PREP: NONE SEEN
YEAST WET PREP: NONE SEEN

## 2015-11-27 LAB — RAPID STREP SCREEN (MED CTR MEBANE ONLY): STREPTOCOCCUS, GROUP A SCREEN (DIRECT): NEGATIVE

## 2015-11-27 LAB — PREGNANCY, URINE: PREG TEST UR: NEGATIVE

## 2015-11-27 MED ORDER — HYDROCODONE-ACETAMINOPHEN 5-325 MG PO TABS
1.0000 | ORAL_TABLET | Freq: Once | ORAL | Status: AC
  Administered 2015-11-27: 1 via ORAL
  Filled 2015-11-27: qty 1

## 2015-11-27 MED ORDER — METRONIDAZOLE 500 MG PO TABS: 500.0000 mg | ORAL_TABLET | Freq: Two times a day (BID) | ORAL | 0 refills | Status: DC

## 2015-11-27 NOTE — ED Triage Notes (Signed)
She c/o low abd. Discomfort with vag. D/c for few days plus a sore throat x 2 days.  She is in no distress.

## 2015-11-27 NOTE — ED Provider Notes (Signed)
[facility] DEPT Provider Note   CSN: [id number] Arrival date & time: 11/27/15  1508  First Provider Contact:  First MD Initiated Contact with Patient 11/27/15 1655        History   Chief Complaint Chief Complaint  Patient presents with  . Vaginal Discharge  . Sore Throat    HPI Robin Horn is a 30 y.o. female.  Patient presents to the ED with a chief complaint of vaginal discharge and sore throat.  1. Sore throat:  Patient states that she has had sore throat and ear fullness for the past 2 days.  She reports associated chills, but denies any measured fever.  She has not tried taking anything for her symptoms.  There are no modifying factors.   2. Vaginal discharge: Patient reports having vaginal discharge x 2 weeks.  She reports some mild lower abdominal pain.  She denies any associated nausea, vomiting, or diarrhea.  Denies any dysuria.  There are no modifying factors.   The history is provided by the patient. No language interpreter was used.    Past Medical History:  Diagnosis Date  . Abnormal Pap smear of cervix   . Anxiety   . Chlamydia   . Depression   . Gonorrhea   . Herpes   . Migraines   . Ovarian cyst   . Preeclampsia   . Pregnant state, incidental   . UTI (lower urinary tract infection)     Patient Active Problem List   Diagnosis Date Noted  . Gonorrhea     Past Surgical History:  Procedure Laterality Date  . COLPOSCOPY    . DILATION AND CURETTAGE OF UTERUS    . TONSILLECTOMY      OB History    Gravida Para Term Preterm AB Living   7 4 4  0 3 4   SAB TAB Ectopic Multiple Live Births   0 3 0 0         Home Medications    Prior to Admission medications   Medication Sig Start Date End Date Taking? Authorizing Provider  ALPRAZolam Duanne Moron) 0.5 MG tablet Take 1 tablet (0.5 mg total) by mouth at bedtime as needed for anxiety. Patient not taking: Reported on 07/31/2015 02/20/15   Dorie Rank, MD  benzonatate (TESSALON) 100 MG capsule Take  1 capsule (100 mg total) by mouth 3 (three) times daily as needed for cough. Patient not taking: Reported on 10/13/2015 07/31/15   Antonietta Breach, PA-C  butalbital-acetaminophen-caffeine (FIORICET) [phone] MG tablet Take 1-2 tablets by mouth every 6 (six) hours as needed for headache. Patient not taking: Reported on 07/31/2015 02/20/15 02/20/16  Dorie Rank, MD  citalopram (CELEXA) 40 MG tablet Take 40 mg by mouth daily as needed (mood). Reported on 10/13/2015 05/17/15   Historical Provider, MD  HYDROcodone-acetaminophen (NORCO/VICODIN) 5-325 MG tablet Take 1-2 tablets by mouth every 6 (six) hours as needed. 09/13/15   Blanchie Dessert, MD  ibuprofen (ADVIL,MOTRIN) 800 MG tablet Take 1 tablet (800 mg total) by mouth 3 (three) times daily. Patient not taking: Reported on 07/31/2015 06/10/15   Hyman Bible, PA-C  Levonorgestrel-Ethinyl Estradiol (AMETHIA,CAMRESE) 0.15-0.03 &0.01 MG tablet Take 1 tablet by mouth daily. 10/13/15   Emily Filbert, MD  lisinopril-hydrochlorothiazide (PRINZIDE,ZESTORETIC) 10-12.5 MG per tablet Take 1 tablet by mouth daily. Reported on 10/13/2015 01/30/14   Historical Provider, MD  LORazepam (ATIVAN) 1 MG tablet Take 1 tablet (1 mg total) by mouth every 8 (eight) hours as needed for anxiety. Patient not taking: Reported  on 07/31/2015 06/13/15   Dalia Heading, PA-C  metroNIDAZOLE (FLAGYL) 500 MG tablet Take 1 tablet (500 mg total) by mouth 2 (two) times daily. Patient not taking: Reported on 07/31/2015 06/10/15   Hyman Bible, PA-C  metroNIDAZOLE (FLAGYL) 500 MG tablet Take 1 tablet (500 mg total) by mouth 2 (two) times daily. 10/13/15   Emily Filbert, MD  naproxen (NAPROSYN) 500 MG tablet Take 1 tablet (500 mg total) by mouth 2 (two) times daily. Patient not taking: Reported on 10/13/2015 07/31/15   Antonietta Breach, PA-C  Phenylephrine-DM-GG-APAP (TYLENOL COLD/FLU SEVERE PO) Take 30 mLs by mouth every 6 (six) hours as needed (cold/flu symptoms). Reported on 10/13/2015    Historical Provider, MD    promethazine (PHENERGAN) 25 MG tablet Take 1 tablet (25 mg total) by mouth every 6 (six) hours as needed for nausea or vomiting. Patient not taking: Reported on 10/13/2015 07/31/15   Antonietta Breach, PA-C  QUEtiapine (SEROQUEL) 400 MG tablet Take 400 mg by mouth at bedtime. Reported on 10/13/2015 07/20/15   Historical Provider, MD  ranitidine (ZANTAC) 150 MG tablet Take 1 tablet (150 mg total) by mouth 2 (two) times daily. Patient not taking: Reported on 10/13/2015 09/13/15   Blanchie Dessert, MD  valACYclovir (VALTREX) 500 MG tablet Take 500 mg by mouth 2 (two) times daily. 07/27/15   Historical Provider, MD    Family History Family History  Problem Relation Age of Onset  . Kidney disease Mother   . Cancer Paternal Grandmother   . Other Neg Hx     Social History Social History  Substance Use Topics  . Smoking status: Current Every Day Smoker    Packs/day: 0.50    Types: Cigarettes  . Smokeless tobacco: Never Used  . Alcohol use Yes     Comment: occasional     Allergies   Review of patient's allergies indicates no known allergies.   Review of Systems Review of Systems  HENT: Positive for sore throat.   Gastrointestinal: Positive for abdominal pain.  Genitourinary: Positive for vaginal discharge.  All other systems reviewed and are negative.    Physical Exam Updated Vital Signs BP 138/86 (BP Location: Left Arm)   Pulse 86   Temp 98.6 F (37 C) (Oral)   Resp 18   LMP 11/08/2015 (Exact Date)   SpO2 100%   Physical Exam  Constitutional: She is oriented to person, place, and time. She appears well-developed and well-nourished.  HENT:  Head: Normocephalic and atraumatic.  Oropharynx is clear, no exudates, no swelling, no abscess, no stridor, uvula is midline Bilateral TMs are clear  Eyes: Conjunctivae and EOM are normal. Pupils are equal, round, and reactive to light.  Neck: Normal range of motion. Neck supple.  Cardiovascular: Normal rate and regular rhythm.  Exam reveals  no gallop and no friction rub.   No murmur heard. Pulmonary/Chest: Effort normal and breath sounds normal. No respiratory distress. She has no wheezes. She has no rales. She exhibits no tenderness.  Abdominal: Soft. Bowel sounds are normal. She exhibits no distension and no mass. There is no tenderness. There is no rebound and no guarding.  No focal abdominal tenderness, no RLQ tenderness or pain at McBurney's point, no RUQ tenderness or Murphy's sign, no left-sided abdominal tenderness, no fluid wave, or signs of peritonitis   Genitourinary:  Genitourinary Comments: Pelvic exam chaperoned by female ER tech, moderate right adnexal tenderness, no left adnexal tenderness, no uterine tenderness, moderate white vaginal discharge, no bleeding, no CMT or friability,  no foreign body, no injury to the external genitalia, no other significant findings   Musculoskeletal: Normal range of motion. She exhibits no edema or tenderness.  Neurological: She is alert and oriented to person, place, and time.  Skin: Skin is warm and dry.  Psychiatric: She has a normal mood and affect. Her behavior is normal. Judgment and thought content normal.  Nursing note and vitals reviewed.    ED Treatments / Results  Labs (all labs ordered are listed, but only abnormal results are displayed) Labs Reviewed  RAPID STREP SCREEN (NOT AT [facility])  WET PREP, GENITAL  URINALYSIS, ROUTINE W REFLEX MICROSCOPIC (NOT AT [facility])  PREGNANCY, URINE  GC/CHLAMYDIA PROBE AMP ([place]) NOT AT [facility]    EKG  EKG Interpretation None       Radiology No results found.  Procedures Procedures (including critical care time)  Medications Ordered in ED Medications - No data to display   Initial Impression / Assessment and Plan / ED Course  I have reviewed the triage vital signs and the nursing notes.  Pertinent labs & imaging results that were available during my care of the patient were reviewed by me and considered in my  medical decision making (see chart for details).  Clinical Course    Patient with right adnexal pain and vaginal discharge.  Pelvic remarkable for discharge and right adnexal pain.  Will check [place].  Patient with sore throat.  Will check rapid strep.  Clue cells on wet prep.  Tx with flagyl.  Right ovarian cyst seen on [place].  Recommend NSAIDs and OBGYN follow-up.   Zyrtec for cold symptoms.  Patient is stable and ready for discharge.  Final Clinical Impressions(s) / ED Diagnoses   Final diagnoses:  Adnexal pain  Sore throat  BV (bacterial vaginosis)    New Prescriptions New Prescriptions   METRONIDAZOLE (FLAGYL) 500 MG TABLET    Take 1 tablet (500 mg total) by mouth 2 (two) times daily.     Montine Circle, PA-C 11/27/15 [place], MD 11/28/15 [phone]

## 2015-11-27 NOTE — ED Notes (Signed)
Patient transported to Ultrasound 

## 2015-11-29 LAB — GC/CHLAMYDIA PROBE AMP (~~LOC~~) NOT AT ARMC
CHLAMYDIA, DNA PROBE: POSITIVE — AB
NEISSERIA GONORRHEA: NEGATIVE

## 2015-11-30 ENCOUNTER — Telehealth (HOSPITAL_BASED_OUTPATIENT_CLINIC_OR_DEPARTMENT_OTHER): Payer: Self-pay | Admitting: *Deleted

## 2015-11-30 LAB — CULTURE, GROUP A STREP (THRC)

## 2015-11-30 NOTE — Telephone Encounter (Signed)
Chart handoff to EDP for treatment plan for + Chlamydia 

## 2015-12-01 ENCOUNTER — Telehealth (HOSPITAL_COMMUNITY): Payer: Self-pay

## 2015-12-01 NOTE — Telephone Encounter (Signed)
Pt (+) for chlamydia.  Chart reviewed by Alvera Singh PA "Azithromycin 1000 mg po x 1.  Partners need evaluation/treatment."     12/01/15 @ 927 No answer, no voicemail.  Letter sent to [facility] address.  [employer] form faxed.

## 2015-12-07 ENCOUNTER — Telehealth (HOSPITAL_BASED_OUTPATIENT_CLINIC_OR_DEPARTMENT_OTHER): Payer: Self-pay | Admitting: *Deleted

## 2015-12-07 ENCOUNTER — Inpatient Hospital Stay (HOSPITAL_COMMUNITY)
Admission: AD | Admit: 2015-12-07 | Discharge: 2015-12-07 | Disposition: A | Discharge status: Home / Self Care | Payer: Medicaid Other | Source: Ambulatory Visit | Attending: Obstetrics and Gynecology | Admitting: Obstetrics and Gynecology

## 2015-12-07 ENCOUNTER — Encounter (HOSPITAL_COMMUNITY): Payer: Self-pay | Admitting: *Deleted

## 2015-12-07 DIAGNOSIS — N739 Female pelvic inflammatory disease, unspecified: Secondary | ICD-10-CM | POA: Insufficient documentation

## 2015-12-07 DIAGNOSIS — N83201 Unspecified ovarian cyst, right side: Secondary | ICD-10-CM | POA: Diagnosis not present

## 2015-12-07 DIAGNOSIS — N898 Other specified noninflammatory disorders of vagina: Secondary | ICD-10-CM | POA: Diagnosis present

## 2015-12-07 DIAGNOSIS — A749 Chlamydial infection, unspecified: Secondary | ICD-10-CM | POA: Insufficient documentation

## 2015-12-07 DIAGNOSIS — N730 Acute parametritis and pelvic cellulitis: Secondary | ICD-10-CM | POA: Diagnosis not present

## 2015-12-07 DIAGNOSIS — F17210 Nicotine dependence, cigarettes, uncomplicated: Secondary | ICD-10-CM | POA: Diagnosis not present

## 2015-12-07 HISTORY — DX: Herpesviral infection of urogenital system, unspecified: A60.00

## 2015-12-07 LAB — URINALYSIS, ROUTINE W REFLEX MICROSCOPIC
Bilirubin Urine: NEGATIVE
Glucose, UA: NEGATIVE mg/dL
HGB URINE DIPSTICK: NEGATIVE
Ketones, ur: NEGATIVE mg/dL
Nitrite: NEGATIVE
PH: 6 (ref 5.0–8.0)
Protein, ur: NEGATIVE mg/dL
SPECIFIC GRAVITY, URINE: 1.02 (ref 1.005–1.030)

## 2015-12-07 LAB — URINE MICROSCOPIC-ADD ON

## 2015-12-07 LAB — WET PREP, GENITAL
Clue Cells Wet Prep HPF POC: NONE SEEN
SPERM: NONE SEEN
TRICH WET PREP: NONE SEEN
YEAST WET PREP: NONE SEEN

## 2015-12-07 LAB — POCT PREGNANCY, URINE: PREG TEST UR: NEGATIVE

## 2015-12-07 MED ORDER — AZITHROMYCIN 250 MG PO TABS
1000.0000 mg | ORAL_TABLET | Freq: Once | ORAL | Status: AC
  Administered 2015-12-07: 1000 mg via ORAL
  Filled 2015-12-07: qty 4

## 2015-12-07 MED ORDER — DOXYCYCLINE HYCLATE 100 MG PO CAPS: 100.0000 mg | ORAL_CAPSULE | Freq: Two times a day (BID) | ORAL | 0 refills | Status: DC

## 2015-12-07 MED ORDER — CEFTRIAXONE SODIUM 250 MG IJ SOLR
250.0000 mg | Freq: Once | INTRAMUSCULAR | Status: AC
  Administered 2015-12-07: 250 mg via INTRAMUSCULAR
  Filled 2015-12-07: qty 250

## 2015-12-07 MED ORDER — OXYCODONE-ACETAMINOPHEN 5-325 MG PO TABS
2.0000 | ORAL_TABLET | Freq: Once | ORAL | Status: AC
Start: 2015-12-07 — End: 2015-12-07
  Administered 2015-12-07: 2 via ORAL
  Filled 2015-12-07: qty 2

## 2015-12-07 MED ORDER — HYDROCODONE-ACETAMINOPHEN 5-325 MG PO TABS: 1.0000 | ORAL_TABLET | Freq: Four times a day (QID) | ORAL | 0 refills | Status: DC | PRN

## 2015-12-07 NOTE — MAU Note (Signed)
Pt states she completed her medication yesterday for BV but feels like its not getting any better.  C/O vaginal irritation, burning, and odor.

## 2015-12-07 NOTE — MAU Note (Signed)
Pt presents to MAU with complaints of vaginal itching with a white vaginal discharge. Pt had intercourse this morning and states her pelvic area feels like it is split open,

## 2015-12-07 NOTE — Discharge Instructions (Signed)
Pelvic Inflammatory Disease Pelvic inflammatory disease (PID) is an infection in some or all of the female organs. PID can be in the uterus, ovaries, fallopian tubes, or the surrounding tissues that are inside the lower belly area (pelvis). PID can lead to lasting problems if it is not treated. To check for this disease, your doctor may:  Do a physical exam.  Do blood tests, urine tests, or a pregnancy test.  Look at your vaginal discharge.  Do tests to look inside the pelvis.  Test you for other infections. HOME CARE  Take over-the-counter and prescription medicines only as told by your doctor.  If you were prescribed an antibiotic medicine, take it as told by your doctor. Do not stop taking it even if you start to feel better.  Do not have sex until treatment is done or as told by your doctor.  Tell your sex partner if you have PID. Your partner may need to be treated.  Keep all follow-up visits as told by your doctor. This is important.  Your doctor may test you for infection again 3 months after you are treated. GET HELP IF:  You have more fluid (discharge) coming from your vagina or fluid that is not normal.  Your pain does not improve.  You throw up (vomit).  You have a fever.  You cannot take your medicines.  Your partner has a sexually transmitted disease (STD).  You have pain when you pee (urinate). GET HELP RIGHT AWAY IF:  You have more belly (abdominal) or lower belly pain.  You have chills.  You are not better after 72 hours.   This information is not intended to replace advice given to you by your health care provider. Make sure you discuss any questions you have with your health care provider.   Document Released: 07/14/2008 Document Revised: 01/06/2015 Document Reviewed: 05/25/2014 Elsevier Interactive Patient Education 2016 [employer].   Sexually Transmitted Disease A sexually transmitted disease (STD) is a disease or infection often passed to  another person during sex. However, STDs can be passed through nonsexual ways. An STD can be passed through:  Spit (saliva).  Semen.  Blood.  Mucus from the vagina.  Pee (urine). HOW CAN I LESSEN MY CHANCES OF GETTING AN STD?  Use:  Latex condoms.  Water-soluble lubricants with condoms. Do not use petroleum jelly or oils.  Dental dams. These are small pieces of latex that are used as a barrier during oral sex.  Avoid having more than one sex partner.  Do not have sex with someone who has other sex partners.  Do not have sex with anyone you do not know or who is at high risk for an STD.  Avoid risky sex that can break your skin.  Do not have sex if you have open sores on your mouth or skin.  Avoid drinking too much alcohol or taking illegal drugs. Alcohol and drugs can affect your good judgment.  Avoid oral and anal sex acts.  Get shots (vaccines) for HPV and hepatitis.  If you are at risk of being infected with HIV, it is advised that you take a certain medicine daily to prevent HIV infection. This is called pre-exposure prophylaxis (PrEP). You may be at risk if:  You are a man who has sex with other men (MSM).  You are attracted to the opposite sex (heterosexual) and are having sex with more than one partner.  You take drugs with a needle.  You have sex with  someone who has HIV.  Talk with your doctor about if you are at high risk of being infected with HIV. If you begin to take PrEP, get tested for HIV first. Get tested every 3 months for as long as you are taking PrEP.  Get tested for STDs every year if you are sexually active. If you are treated for an STD, get tested again 3 months after you are treated. WHAT SHOULD I DO IF I THINK I HAVE AN STD?  See your doctor.  Tell your sex partner(s) that you have an STD. They should be tested and treated.  Do not have sex until your doctor says it is okay. WHEN SHOULD I GET HELP? Get help right away if:  You  have bad belly (abdominal) pain.  You are a man and have puffiness (swelling) or pain in your testicles.  You are a woman and have puffiness in your vagina.   This information is not intended to replace advice given to you by your health care provider. Make sure you discuss any questions you have with your health care provider.   Document Released: 05/25/2004 Document Revised: 05/08/2014 Document Reviewed: 10/11/2012 Elsevier Interactive Patient Education 2016 Elsevier Inc.  Ovarian Cyst An ovarian cyst is a sac filled with fluid or blood. This sac is attached to the ovary. Some cysts go away on their own. Other cysts need treatment.  HOME CARE   Only take medicine as told by your doctor.  Follow up with your doctor as told.  Get regular pelvic exams and Pap tests. GET HELP IF:  Your periods are late, not regular, or painful.  You stop having periods.  Your belly (abdominal) or pelvic pain does not go away.  Your belly becomes large or puffy (swollen).  You have a hard time peeing (totally emptying your bladder).  You have pressure on your bladder.  You have pain during sex.  You feel fullness, pressure, or discomfort in your belly.  You lose weight for no reason.  You feel sick most of the time.  You have a hard time pooping (constipation).  You do not feel like eating.  You develop pimples (acne).  You have an increase in hair on your body and face.  You are gaining weight for no reason.  You think you are pregnant. GET HELP RIGHT AWAY IF:   Your belly pain gets worse.  You feel sick to your stomach (nauseous), and you throw up (vomit).  You have a fever that comes on fast.  You have belly pain while pooping (bowel movement).  Your periods are heavier than usual. MAKE SURE YOU:   Understand these instructions.  Will watch your condition.  Will get help right away if you are not doing well or get worse.   This information is not intended to  replace advice given to you by your health care provider. Make sure you discuss any questions you have with your health care provider.   Document Released: 10/04/2007 Document Revised: 02/05/2013 Document Reviewed: 12/23/2012 Elsevier Interactive Patient Education [employer].

## 2015-12-07 NOTE — MAU Provider Note (Signed)
History     CSN: [id number]  Arrival date and time: 12/07/15 [id number]   First Provider Initiated Contact with Patient 12/07/15 1950      Chief Complaint  Patient presents with  . Vaginal Discharge  . Vaginal Itching   HPI [name] is a 30 y.o. [phone] who presents to MAU today with complaint of vaginal discharge, itching and burning. The patient was seen at [facility] last week and had + chlamydia. They have not been able to contact her with results yet, so patient was unaware. She does states concern for STD exposure due to burning of the vaginal area. She has a history of HSV, but is on suppression therapy and denies outbreaks x years. She did have sex this morning and states that symptoms worsened after that. She is given Vicodin by PCP for RA but has ran out and doesn't have an appointment until next week. She has been taking Tylenol for pelvic pain with some relief. She was also diagnosed with a complex 4.2 cm ovarian cyst last month and has not yet had follow-up.   OB History    Gravida Para Term Preterm AB Living   7 4 4  0 3 4   SAB TAB Ectopic Multiple Live Births   0 3 0 0 1      Past Medical History:  Diagnosis Date  . Abnormal Pap smear of cervix   . Anxiety   . Chlamydia   . Depression   . Gonorrhea   . Herpes   . Herpes genitalia   . Migraines   . Ovarian cyst   . Preeclampsia   . Pregnant state, incidental   . UTI (lower urinary tract infection)     Past Surgical History:  Procedure Laterality Date  . COLPOSCOPY    . DILATION AND CURETTAGE OF UTERUS    . TONSILLECTOMY      Family History  Problem Relation Age of Onset  . Kidney disease Mother   . Cancer Paternal Grandmother   . Other Neg Hx     Social History  Substance Use Topics  . Smoking status: Current Every Day Smoker    Packs/day: 0.50    Types: Cigarettes  . Smokeless tobacco: Never Used  . Alcohol use 0.6 oz/week    1 Glasses of wine per week     Comment: occasional     Allergies:  Allergies  Allergen Reactions  . Lactose Intolerance (Gi) Diarrhea    lactose    Prescriptions Prior to Admission  Medication Sig Dispense Refill Last Dose  . acetaminophen (TYLENOL) 500 MG tablet Take 1,000 mg by mouth every 6 (six) hours as needed.   12/06/2015 at Unknown time  . metroNIDAZOLE (FLAGYL) 500 MG tablet Take 1 tablet (500 mg total) by mouth 2 (two) times daily. 14 tablet 0 12/06/2015 at Unknown time  . valACYclovir (VALTREX) 500 MG tablet Take 500 mg by mouth 2 (two) times daily.  0 12/07/2015 at Unknown time  . benzonatate (TESSALON) 100 MG capsule Take 1 capsule (100 mg total) by mouth 3 (three) times daily as needed for cough. (Patient not taking: Reported on 10/13/2015) 21 capsule 0 Not Taking at Unknown time  . citalopram (CELEXA) 40 MG tablet Take 40 mg by mouth daily as needed (mood). Reported on 10/13/2015  5 Not Taking at Unknown time  . Diphenhydramine-APAP, sleep, (GOODYS PM) 38-500 MG PACK Take 1 packet by mouth every 6 (six) hours as needed (pain).   11/26/2015  at Unknown time  . ibuprofen (ADVIL,MOTRIN) 800 MG tablet Take 1 tablet (800 mg total) by mouth 3 (three) times daily. (Patient not taking: Reported on 07/31/2015) 21 tablet 0 Not Taking at Unknown time  . Levonorgestrel-Ethinyl Estradiol (AMETHIA,CAMRESE) 0.15-0.03 &0.01 MG tablet Take 1 tablet by mouth daily. (Patient not taking: Reported on 11/27/2015) 1 Package 4 Not Taking at Unknown time  . lisinopril-hydrochlorothiazide (PRINZIDE,ZESTORETIC) 10-12.5 MG per tablet Take 1 tablet by mouth daily. Reported on 10/13/2015  0 Not Taking at Unknown time  . LORazepam (ATIVAN) 1 MG tablet Take 1 tablet (1 mg total) by mouth every 8 (eight) hours as needed for anxiety. (Patient not taking: Reported on 07/31/2015) 9 tablet 0 Not Taking at Unknown time  . naproxen (NAPROSYN) 500 MG tablet Take 1 tablet (500 mg total) by mouth 2 (two) times daily. (Patient not taking: Reported on 10/13/2015) 30 tablet 0 Not Taking at  Unknown time  . promethazine (PHENERGAN) 25 MG tablet Take 1 tablet (25 mg total) by mouth every 6 (six) hours as needed for nausea or vomiting. (Patient not taking: Reported on 10/13/2015) 12 tablet 0 Not Taking at Unknown time  . QUEtiapine (SEROQUEL) 400 MG tablet Take 400 mg by mouth at bedtime. Reported on 10/13/2015  0 Not Taking at Unknown time  . ranitidine (ZANTAC) 150 MG tablet Take 1 tablet (150 mg total) by mouth 2 (two) times daily. (Patient not taking: Reported on 10/13/2015) 28 tablet 0 Not Taking at Unknown time    Review of Systems  Constitutional: Positive for chills. Negative for fever and malaise/fatigue.  Gastrointestinal: Positive for abdominal pain and nausea. Negative for constipation, diarrhea and vomiting.  Genitourinary: Negative for dysuria, frequency and urgency.       + vaginal discharge, spotting, burning, pain   Physical Exam   Blood pressure 139/98, pulse 83, temperature 98.6 F (37 C), resp. rate 18, weight 162 lb 3.2 oz (73.6 kg), last menstrual period 11/08/2015.  Physical Exam  Nursing note and vitals reviewed. Constitutional: She is oriented to person, place, and time. She appears well-developed and well-nourished. No distress.  HENT:  Head: Normocephalic and atraumatic.  Cardiovascular: Normal rate.   Respiratory: Effort normal.  GI: Soft. She exhibits no distension and no mass. There is tenderness (mild lower abdominal tenderness to palpation bilaterally). There is no rebound and no guarding.  Genitourinary: Uterus is tender. Uterus is not enlarged. Cervix exhibits friability. Cervix exhibits no motion tenderness and no discharge. Right adnexum displays tenderness. Right adnexum displays no mass. Left adnexum displays tenderness. Left adnexum displays no mass. No bleeding in the vagina. Vaginal discharge (small amount of mucus discharge noted) found.  Neurological: She is alert and oriented to person, place, and time.  Skin: Skin is warm and dry. No  erythema.  Psychiatric: She has a normal mood and affect.    Results for orders placed or performed during the hospital encounter of 12/07/15 (from the past 24 hour(s))  Urinalysis, Routine w reflex microscopic (not at [facility])     Status: Abnormal   Collection Time: 12/07/15  7:11 PM  Result Value Ref Range   Color, Urine YELLOW YELLOW   APPearance CLEAR CLEAR   Specific Gravity, Urine 1.020 1.005 - 1.030   pH 6.0 5.0 - 8.0   Glucose, UA NEGATIVE NEGATIVE mg/dL   Hgb urine dipstick NEGATIVE NEGATIVE   Bilirubin Urine NEGATIVE NEGATIVE   Ketones, ur NEGATIVE NEGATIVE mg/dL   Protein, ur NEGATIVE NEGATIVE mg/dL   Nitrite NEGATIVE  NEGATIVE   Leukocytes, UA TRACE (A) NEGATIVE  Urine microscopic-add on     Status: Abnormal   Collection Time: 12/07/15  7:11 PM  Result Value Ref Range   Squamous Epithelial / LPF 0-5 (A) NONE SEEN   WBC, UA 0-5 0 - 5 WBC/hpf   RBC / HPF 0-5 0 - 5 RBC/hpf   Bacteria, UA RARE (A) NONE SEEN  Pregnancy, urine POC     Status: None   Collection Time: 12/07/15  7:13 PM  Result Value Ref Range   Preg Test, Ur NEGATIVE NEGATIVE    MAU Course  Procedures None  MDM UPT - negative UA, wet prep, GC/Chlmaydia, HIV and RPR today  Patient has concerns for STD exposure and very tender on pelvic exam. Will treat for possible PID. + Chlamydia in ED last week, called with results, but no answer. Has not received treatment. Patient was unaware of actual diagnosis.  250 mg IM Rocephin, 1 G PO Zithromax today 2 Percocet given for pain in MAU Assessment and Plan  A: PID Right ovarian cyst  Chlamydia infection   P: Discharge home Rx for Doxycycline sent to patient's pharmacy Rx for Vicodin given to patient  Warning signs for worsening condition discussed Outpatient [place] for re-evaluation of complex cyst ordered. They will call with appointment.  Patient advised to follow-up with [facility] for further evaluation and management of ovarian cyst. They will call with  appointment.  Patient may return to MAU as needed or if her condition were to change or worsen  Luvenia Redden, PA-C  12/07/2015, 8:02 PM

## 2015-12-08 LAB — GC/CHLAMYDIA PROBE AMP (~~LOC~~) NOT AT ARMC
CHLAMYDIA, DNA PROBE: POSITIVE — AB
Neisseria Gonorrhea: NEGATIVE

## 2015-12-08 LAB — HIV ANTIBODY (ROUTINE TESTING W REFLEX): HIV SCREEN 4TH GENERATION: NONREACTIVE

## 2015-12-08 LAB — RPR: RPR: NONREACTIVE

## 2015-12-15 ENCOUNTER — Encounter: Payer: Self-pay | Admitting: *Deleted

## 2016-01-13 ENCOUNTER — Emergency Department (HOSPITAL_COMMUNITY)
Admission: EM | Admit: 2016-01-13 | Discharge: 2016-01-13 | Disposition: A | Discharge status: Home / Self Care | Payer: Medicaid Other | Attending: Emergency Medicine | Admitting: Emergency Medicine

## 2016-01-13 ENCOUNTER — Encounter (HOSPITAL_COMMUNITY): Payer: Self-pay | Admitting: Emergency Medicine

## 2016-01-13 DIAGNOSIS — Z79899 Other long term (current) drug therapy: Secondary | ICD-10-CM | POA: Insufficient documentation

## 2016-01-13 DIAGNOSIS — J069 Acute upper respiratory infection, unspecified: Secondary | ICD-10-CM | POA: Diagnosis not present

## 2016-01-13 DIAGNOSIS — F17210 Nicotine dependence, cigarettes, uncomplicated: Secondary | ICD-10-CM | POA: Diagnosis not present

## 2016-01-13 DIAGNOSIS — J029 Acute pharyngitis, unspecified: Secondary | ICD-10-CM

## 2016-01-13 LAB — RAPID STREP SCREEN (MED CTR MEBANE ONLY): Streptococcus, Group A Screen (Direct): NEGATIVE

## 2016-01-13 MED ORDER — PSEUDOEPHEDRINE-GUAIFENESIN ER 60-600 MG PO TB12: 1.0000 | ORAL_TABLET | Freq: Two times a day (BID) | ORAL | 0 refills | Status: DC

## 2016-01-13 MED ORDER — CETIRIZINE HCL 10 MG PO TABS: 10.0000 mg | ORAL_TABLET | Freq: Every day | ORAL | 0 refills | Status: DC

## 2016-01-13 MED ORDER — LIDOCAINE VISCOUS 2 % MT SOLN
15.0000 mL | Freq: Once | OROMUCOSAL | Status: AC
  Administered 2016-01-13: 15 mL via OROMUCOSAL
  Filled 2016-01-13 (×2): qty 15

## 2016-01-13 MED ORDER — LIDOCAINE VISCOUS 2 % MT SOLN: 15.0000 mL | OROMUCOSAL | 1 refills | Status: DC | PRN

## 2016-01-13 NOTE — Progress Notes (Signed)
Pt has had a sore throat times two weeks.

## 2016-01-13 NOTE — ED Provider Notes (Signed)
[facility] DEPT [facility] Provider Note   CSN: [id number] Arrival date & time: 01/13/16  1604  By signing my name below, I, Estanislado Pandy, attest that this documentation has been prepared under the direction and in the presence of Taleisha Kaczynski L. Alejandro Gamel, PA-C. Electronically Signed: Estanislado Pandy, Scribe. 01/13/2016. 8:22 PM.    History   Chief Complaint Chief Complaint  Patient presents with  . Sore Throat    The history is provided by the patient. No language interpreter was used.   HPI Comments:  Robin Horn is a 30 y.o. female who presents to the Emergency Department complaining of constant, worsening sore throat x 2 weeks. Pt describes the pain as burning, and she is having difficulty eating. Pt complains of associated ear pain, rhinorrhea, nausea, congestion, productive cough, sneezing, decreased urination. Pt states that her cough is at its worst at night and wakes her up frequently. Pt notes possible sick contact with family members. Pt has taken tylenol with no relief. Pt denies vomiting, blood in stool, hematuria.   Past Medical History:  Diagnosis Date  . Abnormal Pap smear of cervix   . Anxiety   . Chlamydia   . Depression   . Gonorrhea   . Herpes   . Herpes genitalia   . Migraines   . Ovarian cyst   . Preeclampsia   . Pregnant state, incidental   . UTI (lower urinary tract infection)     Patient Active Problem List   Diagnosis Date Noted  . Gonorrhea     Past Surgical History:  Procedure Laterality Date  . COLPOSCOPY    . DILATION AND CURETTAGE OF UTERUS    . TONSILLECTOMY      OB History    Gravida Para Term Preterm AB Living   7 4 4  0 3 4   SAB TAB Ectopic Multiple Live Births   0 3 0 0 1       Home Medications    Prior to Admission medications   Medication Sig Start Date End Date Taking? Authorizing Provider  acetaminophen (TYLENOL) 500 MG tablet Take 1,000 mg by mouth every 6 (six) hours as needed.    Historical Provider, MD    cetirizine (ZYRTEC) 10 MG tablet Take 1 tablet (10 mg total) by mouth at bedtime. 01/13/16   Kalman Drape, PA  doxycycline (VIBRAMYCIN) 100 MG capsule Take 1 capsule (100 mg total) by mouth 2 (two) times daily. 12/07/15   Luvenia Redden, PA-C  HYDROcodone-acetaminophen (NORCO/VICODIN) 5-325 MG tablet Take 1-2 tablets by mouth every 6 (six) hours as needed for moderate pain. 12/07/15   Luvenia Redden, PA-C  lidocaine (XYLOCAINE) 2 % solution Use as directed 15 mLs in the mouth or throat as needed for mouth pain. 01/13/16   Kalman Drape, PA  pseudoephedrine-guaifenesin (MUCINEX D) 60-600 MG 12 hr tablet Take 1 tablet by mouth every 12 (twelve) hours. 01/13/16   Kalman Drape, PA  valACYclovir (VALTREX) 500 MG tablet Take 500 mg by mouth 2 (two) times daily. 07/27/15   Historical Provider, MD    Family History Family History  Problem Relation Age of Onset  . Kidney disease Mother   . Cancer Paternal Grandmother   . Other Neg Hx     Social History Social History  Substance Use Topics  . Smoking status: Current Every Day Smoker    Packs/day: 0.50    Types: Cigarettes  . Smokeless tobacco: Never Used  . Alcohol use 0.6 oz/week  1 Glasses of wine per week     Comment: occasional     Allergies   Lactose intolerance (gi)   Review of Systems Review of Systems  Constitutional: Negative for fever.  HENT: Positive for ear pain, rhinorrhea, sore throat and trouble swallowing.   Respiratory: Positive for cough.   Gastrointestinal: Positive for nausea. Negative for vomiting.  Genitourinary: Negative for decreased urine volume and hematuria.     Physical Exam Updated Vital Signs BP 159/97   Pulse 93   Temp 98.3 F (36.8 C) (Oral)   Resp 17   Ht 5\' 3"  (1.6 m)   Wt 162 lb (73.5 kg)   SpO2 100%   BMI 28.70 kg/m   Physical Exam  Constitutional: She appears well-developed and well-nourished. No distress.  HENT:  Head: Normocephalic and atraumatic.  Right Ear: Tympanic  membrane, external ear and ear canal normal.  Left Ear: Tympanic membrane, external ear and ear canal normal.  Mouth/Throat: Uvula is midline and mucous membranes are normal. Oral lesions (Ulcer R lower gum) present. No trismus in the jaw. No uvula swelling. Posterior oropharyngeal erythema (Mild) present. No oropharyngeal exudate, posterior oropharyngeal edema or tonsillar abscesses.  Eyes: Conjunctivae are normal.  Cardiovascular: Normal rate, regular rhythm and normal heart sounds.   Pulmonary/Chest: Effort normal and breath sounds normal. No respiratory distress.  Lungs clear  Musculoskeletal: Normal range of motion.  Neurological: She is alert. Coordination normal.  Skin: Skin is warm and dry. She is not diaphoretic.  Psychiatric: She has a normal mood and affect. Her behavior is normal.  Nursing note and vitals reviewed.    ED Treatments / Results  DIAGNOSTIC STUDIES:  Oxygen Saturation is 100% on RA, normal by my interpretation.    COORDINATION OF CARE:  8:22 PM Discussed treatment plan with pt at bedside and pt agreed to plan.   Labs (all labs ordered are listed, but only abnormal results are displayed) Labs Reviewed  RAPID STREP SCREEN (NOT AT [facility])  CULTURE, GROUP A STREP [facility])    EKG  EKG Interpretation None       Radiology No results found.  Procedures Procedures (including critical care time)  Medications Ordered in ED Medications  lidocaine (XYLOCAINE) 2 % viscous mouth solution 15 mL (15 mLs Mouth/Throat Given 01/13/16 2041)     Initial Impression / Assessment and Plan / ED Course  I have reviewed the triage vital signs and the nursing notes.  Pertinent labs & imaging results that were available during my care of the patient were reviewed by me and considered in my medical decision making (see chart for details).  Clinical Course   Pt afebrile without tonsillar exudate, negative strep. Presents with no cervical lymphadenopathy, & with  dysphagia; diagnosis of pharyngitis likely 2/2 viral URI. No abx indicated. DC w symptomatic tx for pain to include Mucinex D, zyrtec and viscose lidocaine.  Pt does not appear dehydrated, but did discuss importance of water rehydration. Presentation non concerning for PTA or infxn spread to soft tissue. No trismus or uvula deviation. Specific return precautions discussed. Instructed pt to follow up with PCP in 2-3 days if symptoms are not improving. Pt able to drink water in ED without difficulty with intact air way. Recommended PCP follow up.  I personally performed the services described in this documentation, which was scribed in my presence. The recorded information has been reviewed and is accurate.   Final Clinical Impressions(s) / ED Diagnoses   Final diagnoses:  Sore throat  URI (upper respiratory infection)    New Prescriptions Discharge Medication List as of 01/13/2016  8:35 PM    START taking these medications   Details  cetirizine (ZYRTEC) 10 MG tablet Take 1 tablet (10 mg total) by mouth at bedtime., Starting Thu 01/13/2016, Print    lidocaine (XYLOCAINE) 2 % solution Use as directed 15 mLs in the mouth or throat as needed for mouth pain., Starting Thu 01/13/2016, Print    pseudoephedrine-guaifenesin (MUCINEX D) 60-600 MG 12 hr tablet Take 1 tablet by mouth every 12 (twelve) hours., Starting Thu 01/13/2016, [place], [place] 01/14/16 1616    Isla Pence, MD 01/18/16 1348

## 2016-01-13 NOTE — ED Triage Notes (Signed)
Pt complaining of cough and sore throat for the past 2 weeks. States that over the last 2 days the pain has gotten worse, especially when eating. Reports her daughter was diagnosed with strep 2-3 weeks ago. Reports fever and chills at night for the past 2 weeks.

## 2016-01-13 NOTE — Discharge Instructions (Signed)
Take the medications as prescribed. Follow up with your primary care provider in 2-3 days if your symptoms are not improving. Be sure to drink plenty of fluids, at least eight 8oz glasses of water per day.   Return to the emergency department if you experience fever, swelling of your throat, difficulty swallowing, difficulty opening your mouth, nausea, vomiting, or any other concerning symptoms.

## 2016-01-13 NOTE — ED Notes (Signed)
PA at bedside.

## 2016-01-16 LAB — CULTURE, GROUP A STREP (THRC)

## 2016-01-20 ENCOUNTER — Encounter: Payer: Medicaid Other | Admitting: Obstetrics & Gynecology

## 2016-01-20 NOTE — Progress Notes (Signed)
Pt came in for appt for ovarian cyst after having an [place].  Pt did not have [place] as recommended by [facility].  Per Dr. Hulan Fray, pt needs to have [place] scheduled and rescheduled for GYN appt.  Informed pt of providers recommendations.  Pt agreed.  Pt notified of [place] scheduled for 01/21/16 @ 1000 and appt rescheduled @ [facility] with Dr. Hulan Fray.

## 2016-01-21 ENCOUNTER — Ambulatory Visit (HOSPITAL_COMMUNITY)
Admission: RE | Admit: 2016-01-21 | Discharge: 2016-01-21 | Disposition: A | Discharge status: Home / Self Care | Payer: Medicaid Other | Source: Ambulatory Visit | Attending: Medical | Admitting: Medical

## 2016-01-21 DIAGNOSIS — N83201 Unspecified ovarian cyst, right side: Secondary | ICD-10-CM | POA: Diagnosis present

## 2016-01-21 DIAGNOSIS — N854 Malposition of uterus: Secondary | ICD-10-CM | POA: Diagnosis not present

## 2016-01-25 ENCOUNTER — Telehealth: Payer: Self-pay

## 2016-01-25 NOTE — Telephone Encounter (Signed)
Received

## 2016-01-26 ENCOUNTER — Encounter: Payer: Medicaid Other | Admitting: Obstetrics & Gynecology

## 2016-02-07 ENCOUNTER — Encounter: Payer: Medicaid Other | Admitting: Family Medicine

## 2016-02-08 ENCOUNTER — Encounter: Payer: Medicaid Other | Admitting: Family Medicine

## 2016-02-11 ENCOUNTER — Encounter: Payer: Medicaid Other | Admitting: Obstetrics & Gynecology

## 2016-02-22 ENCOUNTER — Inpatient Hospital Stay (HOSPITAL_COMMUNITY)
Admission: AD | Admit: 2016-02-22 | Discharge: 2016-02-22 | Disposition: A | Discharge status: Home / Self Care | Payer: Medicaid Other | Source: Ambulatory Visit | Attending: Obstetrics and Gynecology | Admitting: Obstetrics and Gynecology

## 2016-02-22 ENCOUNTER — Encounter (HOSPITAL_COMMUNITY): Payer: Self-pay | Admitting: *Deleted

## 2016-02-22 ENCOUNTER — Inpatient Hospital Stay (HOSPITAL_COMMUNITY): Payer: Medicaid Other

## 2016-02-22 DIAGNOSIS — J069 Acute upper respiratory infection, unspecified: Secondary | ICD-10-CM | POA: Insufficient documentation

## 2016-02-22 DIAGNOSIS — R109 Unspecified abdominal pain: Secondary | ICD-10-CM | POA: Diagnosis not present

## 2016-02-22 DIAGNOSIS — R1031 Right lower quadrant pain: Secondary | ICD-10-CM | POA: Insufficient documentation

## 2016-02-22 DIAGNOSIS — O26891 Other specified pregnancy related conditions, first trimester: Secondary | ICD-10-CM

## 2016-02-22 DIAGNOSIS — O99511 Diseases of the respiratory system complicating pregnancy, first trimester: Secondary | ICD-10-CM | POA: Insufficient documentation

## 2016-02-22 DIAGNOSIS — F17210 Nicotine dependence, cigarettes, uncomplicated: Secondary | ICD-10-CM | POA: Diagnosis not present

## 2016-02-22 DIAGNOSIS — Z3A01 Less than 8 weeks gestation of pregnancy: Secondary | ICD-10-CM | POA: Insufficient documentation

## 2016-02-22 DIAGNOSIS — O99331 Smoking (tobacco) complicating pregnancy, first trimester: Secondary | ICD-10-CM | POA: Diagnosis not present

## 2016-02-22 DIAGNOSIS — O26899 Other specified pregnancy related conditions, unspecified trimester: Secondary | ICD-10-CM

## 2016-02-22 DIAGNOSIS — O3680X0 Pregnancy with inconclusive fetal viability, not applicable or unspecified: Secondary | ICD-10-CM

## 2016-02-22 LAB — CBC
HEMATOCRIT: 34.6 % — AB (ref 36.0–46.0)
Hemoglobin: 11.5 g/dL — ABNORMAL LOW (ref 12.0–15.0)
MCH: 28.4 pg (ref 26.0–34.0)
MCHC: 33.2 g/dL (ref 30.0–36.0)
MCV: 85.4 fL (ref 78.0–100.0)
Platelets: 234 10*3/uL (ref 150–400)
RBC: 4.05 MIL/uL (ref 3.87–5.11)
RDW: 14.4 % (ref 11.5–15.5)
WBC: 10.1 10*3/uL (ref 4.0–10.5)

## 2016-02-22 LAB — URINALYSIS, ROUTINE W REFLEX MICROSCOPIC
BILIRUBIN URINE: NEGATIVE
Glucose, UA: NEGATIVE mg/dL
Hgb urine dipstick: NEGATIVE
Ketones, ur: NEGATIVE mg/dL
Leukocytes, UA: NEGATIVE
NITRITE: NEGATIVE
Protein, ur: NEGATIVE mg/dL
SPECIFIC GRAVITY, URINE: 1.025 (ref 1.005–1.030)
pH: 5.5 (ref 5.0–8.0)

## 2016-02-22 LAB — WET PREP, GENITAL
Sperm: NONE SEEN
Trich, Wet Prep: NONE SEEN
Yeast Wet Prep HPF POC: NONE SEEN

## 2016-02-22 LAB — INFLUENZA PANEL BY PCR (TYPE A & B)
H1N1 flu by pcr: NOT DETECTED
INFLAPCR: NEGATIVE
Influenza B By PCR: NEGATIVE

## 2016-02-22 LAB — HCG, QUANTITATIVE, PREGNANCY: HCG, BETA CHAIN, QUANT, S: 33 m[IU]/mL — AB (ref <5)

## 2016-02-22 LAB — POCT PREGNANCY, URINE: PREG TEST UR: POSITIVE — AB

## 2016-02-22 LAB — RAPID STREP SCREEN (MED CTR MEBANE ONLY): STREPTOCOCCUS, GROUP A SCREEN (DIRECT): NEGATIVE

## 2016-02-22 MED ORDER — BENZONATATE 100 MG PO CAPS: 100.0000 mg | ORAL_CAPSULE | Freq: Three times a day (TID) | ORAL | 0 refills | Status: DC

## 2016-02-22 MED ORDER — BENZONATATE 100 MG PO CAPS
100.0000 mg | ORAL_CAPSULE | Freq: Once | ORAL | Status: AC
  Administered 2016-02-22: 100 mg via ORAL
  Filled 2016-02-22: qty 1

## 2016-02-22 MED ORDER — ACETAMINOPHEN 500 MG PO TABS
1000.0000 mg | ORAL_TABLET | Freq: Once | ORAL | Status: AC
  Administered 2016-02-22: 1000 mg via ORAL
  Filled 2016-02-22: qty 2

## 2016-02-22 MED ORDER — PSEUDOEPHEDRINE HCL 30 MG PO TABS
60.0000 mg | ORAL_TABLET | Freq: Once | ORAL | Status: AC
  Administered 2016-02-22: 60 mg via ORAL
  Filled 2016-02-22: qty 2

## 2016-02-22 MED ORDER — PROMETHAZINE HCL 25 MG PO TABS: 25.0000 mg | ORAL_TABLET | Freq: Four times a day (QID) | ORAL | 0 refills | Status: DC | PRN

## 2016-02-22 NOTE — MAU Provider Note (Signed)
History     CSN: [id number]  Arrival date and time: 02/22/16 [id number]   First Provider Initiated Contact with Patient 02/22/16 [phone]        Chief Complaint  Patient presents with  . Abdominal Pain  . Sore Throat  . Fever   HPI  Robin Horn is a 29 y.o. [name] at [redacted]w[redacted]d by LMP who presents with abdominal pain & sore throat.  Pt reports lower abdominal pain for months. Was previously diagnosed with right ovarian cyst. States pain is similar to the cyst pain but has been improving. Describes as sharp RLQ pain that comes & goes. Rates 5/10. Has been taking vicodin prescribed by her PCP for back pain with mild relief. Denies vaginal bleeding or vaginal discharge.  Cold symptoms started 2 days ago. Denies sick contacts. Pt reports productive cough, sore throat, rhinorrhea, & bilateral otalgia. Cough is productive of yellow mucous. Denies chest pain or SOB. Had fever yesterday of 102. Sore throat is worst of her symptoms; rates pain 9/10. Difficult to swallow d/t pain. Has not treated cold symptoms.   OB History    Gravida Para Term Preterm AB Living   8 4 4  0 3 4   SAB TAB Ectopic Multiple Live Births   0 3 0 0 4      Past Medical History:  Diagnosis Date  . Abnormal Pap smear of cervix   . Anxiety   . Chlamydia   . Depression   . Gonorrhea   . Herpes genitalia   . Migraines   . Ovarian cyst   . Preeclampsia     Past Surgical History:  Procedure Laterality Date  . COLPOSCOPY    . DILATION AND CURETTAGE OF UTERUS    . TONSILLECTOMY      Family History  Problem Relation Age of Onset  . Kidney disease Mother   . Cancer Paternal Grandmother   . Other Neg Hx     Social History  Substance Use Topics  . Smoking status: Current Every Day Smoker    Packs/day: 0.25    Types: Cigarettes  . Smokeless tobacco: Never Used  . Alcohol use 0.6 oz/week    1 Glasses of wine per week     Comment: occasional    Allergies:  Allergies  Allergen Reactions  . Lactose  Intolerance (Gi) Diarrhea    Prescriptions Prior to Admission  Medication Sig Dispense Refill Last Dose  . acetaminophen (TYLENOL) 500 MG tablet Take 1,000 mg by mouth every 6 (six) hours as needed for mild pain, moderate pain or headache.    Past Month at Unknown time  . HYDROcodone-acetaminophen (NORCO/VICODIN) 5-325 MG tablet Take 1-2 tablets by mouth every 6 (six) hours as needed for moderate pain. 12 tablet 0 02/22/2016 at 0500  . valACYclovir (VALTREX) 500 MG tablet Take 500 mg by mouth 2 (two) times daily.  0 Past Week at Unknown time    Review of Systems  Constitutional: Positive for fever. Negative for chills.  HENT: Positive for congestion, ear pain and sore throat. Negative for ear discharge and tinnitus.   Respiratory: Positive for cough and sputum production. Negative for shortness of breath and wheezing.   Cardiovascular: Negative for chest pain.  Gastrointestinal: Positive for abdominal pain and nausea. Negative for constipation, diarrhea and vomiting.  Genitourinary: Negative.   Musculoskeletal: Negative for myalgias.  Neurological: Negative for headaches.   Physical Exam   Blood pressure 143/96, pulse 103, temperature 99.1 F (37.3 C), temperature source Oral,  resp. rate 18, last menstrual period 01/14/2016, SpO2 100 %.  Physical Exam  Nursing note and vitals reviewed. Constitutional: She is oriented to person, place, and time. She appears well-developed and well-nourished. No distress.  HENT:  Head: Normocephalic and atraumatic.  Right Ear: Tympanic membrane normal.  Left Ear: Tympanic membrane normal.  Nose: Rhinorrhea present.  Mouth/Throat: Oropharynx is clear and moist.  Eyes: Conjunctivae are normal. Right eye exhibits no discharge. Left eye exhibits no discharge. No scleral icterus.  Cardiovascular: Normal rate, regular rhythm and normal heart sounds.   No murmur heard. Respiratory: Effort normal and breath sounds normal. No respiratory distress. She has  no wheezes.  GI: Soft. Bowel sounds are normal. She exhibits no distension. There is no tenderness. There is no rebound and no guarding.  Genitourinary: Uterus normal. Cervix exhibits no motion tenderness. Right adnexum displays no mass and no tenderness. Left adnexum displays no mass and no tenderness.  Genitourinary Comments: Cervix closed  Lymphadenopathy:       Head (right side): Submandibular adenopathy present.       Head (left side): Submandibular adenopathy present.  Neurological: She is alert and oriented to person, place, and time.  Skin: Skin is warm and dry. She is not diaphoretic.  Psychiatric: She has a normal mood and affect. Her behavior is normal. Judgment and thought content normal.    MAU Course  Procedures Results for orders placed or performed during the hospital encounter of 02/22/16 (from the past 24 hour(s))  Urinalysis, Routine w reflex microscopic (not at [facility])     Status: None   Collection Time: 02/22/16  8:51 AM  Result Value Ref Range   Color, Urine YELLOW YELLOW   APPearance CLEAR CLEAR   Specific Gravity, Urine 1.025 1.005 - 1.030   pH 5.5 5.0 - 8.0   Glucose, UA NEGATIVE NEGATIVE mg/dL   Hgb urine dipstick NEGATIVE NEGATIVE   Bilirubin Urine NEGATIVE NEGATIVE   Ketones, ur NEGATIVE NEGATIVE mg/dL   Protein, ur NEGATIVE NEGATIVE mg/dL   Nitrite NEGATIVE NEGATIVE   Leukocytes, UA NEGATIVE NEGATIVE  Pregnancy, urine POC     Status: Abnormal   Collection Time: 02/22/16  9:06 AM  Result Value Ref Range   Preg Test, Ur POSITIVE (A) NEGATIVE  Rapid strep screen     Status: None   Collection Time: 02/22/16  9:12 AM  Result Value Ref Range   Streptococcus, Group A Screen (Direct) NEGATIVE NEGATIVE  Influenza panel by PCR (type A & B, H1N1)     Status: None   Collection Time: 02/22/16  9:12 AM  Result Value Ref Range   Influenza A By PCR NEGATIVE NEGATIVE   Influenza B By PCR NEGATIVE NEGATIVE   H1N1 flu by pcr NOT DETECTED NOT DETECTED  CBC      Status: Abnormal   Collection Time: 02/22/16 10:02 AM  Result Value Ref Range   WBC 10.1 4.0 - 10.5 K/uL   RBC 4.05 3.87 - 5.11 MIL/uL   Hemoglobin 11.5 (L) 12.0 - 15.0 g/dL   HCT 34.6 (L) 36.0 - 46.0 %   MCV 85.4 78.0 - 100.0 fL   MCH 28.4 26.0 - 34.0 pg   MCHC 33.2 30.0 - 36.0 g/dL   RDW 14.4 11.5 - 15.5 %   Platelets 234 150 - 400 K/uL  hCG, quantitative, pregnancy     Status: Abnormal   Collection Time: 02/22/16 10:02 AM  Result Value Ref Range   hCG, Beta Chain, Quant, S 33 (H) <  5 mIU/mL  Wet prep, genital     Status: Abnormal   Collection Time: 02/22/16 10:12 AM  Result Value Ref Range   Yeast Wet Prep HPF POC NONE SEEN NONE SEEN   Trich, Wet Prep NONE SEEN NONE SEEN   Clue Cells Wet Prep HPF POC PRESENT (A) NONE SEEN   WBC, Wet Prep HPF POC FEW (A) NONE SEEN   Sperm NONE SEEN    US Ob Comp Less 14 Wks  Result Date: 02/22/2016 CLINICAL DATA:  Abdominal pain for 5 months EXAM: OBSTETRIC <14 WK [place] AND TRANSVAGINAL OB US TECHNIQUE: Both transabdominal and transvaginal ultrasound examinations were performed for complete evaluation of the gestation as well as the maternal uterus, adnexal regions, and pelvic cul-de-sac. Transvaginal technique was performed to assess early pregnancy. COMPARISON:  None. FINDINGS: Intrauterine gestational sac: None visualized Yolk sac:  Not visualized Embryo:  Not visualized Maternal uterus/adnexae: No adnexal mass. Small amount of free fluid in the pelvis. IMPRESSION: No intrauterine pregnancy visualized. Differential considerations would include early intrauterine pregnancy too early to visualize, spontaneous abortion, or occult ectopic pregnancy. Recommend close clinical followup and serial quantitative beta HCGs and ultrasounds. Electronically Signed   By: Rolm Baptise M.D.   On: 02/22/2016 11:34   US Ob Transvaginal  Result Date: 02/22/2016 CLINICAL DATA:  Abdominal pain for 5 months EXAM: OBSTETRIC <14 WK [place] AND TRANSVAGINAL OB US TECHNIQUE: Both  transabdominal and transvaginal ultrasound examinations were performed for complete evaluation of the gestation as well as the maternal uterus, adnexal regions, and pelvic cul-de-sac. Transvaginal technique was performed to assess early pregnancy. COMPARISON:  None. FINDINGS: Intrauterine gestational sac: None visualized Yolk sac:  Not visualized Embryo:  Not visualized Maternal uterus/adnexae: No adnexal mass. Small amount of free fluid in the pelvis. IMPRESSION: No intrauterine pregnancy visualized. Differential considerations would include early intrauterine pregnancy too early to visualize, spontaneous abortion, or occult ectopic pregnancy. Recommend close clinical followup and serial quantitative beta HCGs and ultrasounds. Electronically Signed   By: Rolm Baptise M.D.   On: 02/22/2016 11:34    MDM +UPT UA, wet prep, GC/chlamydia, CBC, ABO/Rh, quant hCG, HIV, and [place] today to rule out ectopic pregnancy  VSS, afebrile Flu & strep swabs collected BHCG 33; ultrasound shows no IUP or adnexal mass Tylenol, tessalon, & sudafed given in [facility]U  Assessment and Plan  A: 1. Pregnancy of unknown anatomic location   2. Abdominal pain during pregnancy   3. Acute upper respiratory infection    P: Discharge home Flu & strep swabs pending Return Thursday morning to [facility] for f/u BHCG Discussed reasons to return to [facility] Rx phenergan & tessalon Chloraseptic spray & salt water gargles for throat OTC meds safe in pregnancy list given  Jorje Guild 02/22/2016, 9:42 AM

## 2016-02-22 NOTE — Discharge Instructions (Signed)
Abdominal Pain During Pregnancy Belly (abdominal) pain is common during pregnancy. Most of the time, it is not a serious problem. Other times, it can be a sign that something is wrong with the pregnancy. Always tell your doctor if you have belly pain. HOME CARE Monitor your belly pain for any changes. The following actions may help you feel better:  Do not have sex (intercourse) or put anything in your vagina until you feel better.  Rest until your pain stops.  Drink clear fluids if you feel sick to your stomach (nauseous). Do not eat solid food until you feel better.  Only take medicine as told by your doctor.  Keep all doctor visits as told. GET HELP RIGHT AWAY IF:   You are bleeding, leaking fluid, or pieces of tissue come out of your vagina.  You have more pain or cramping.  You keep throwing up (vomiting).  You have pain when you pee (urinate) or have blood in your pee.  You have a fever.  You do not feel your baby moving as much.  You feel very weak or feel like passing out.  You have trouble breathing, with or without belly pain.  You have a very bad headache and belly pain.  You have fluid leaking from your vagina and belly pain.  You keep having watery poop (diarrhea).  Your belly pain does not go away after resting, or the pain gets worse. MAKE SURE YOU:   Understand these instructions.  Will watch your condition.  Will get help right away if you are not doing well or get worse.   This information is not intended to replace advice given to you by your health care provider. Make sure you discuss any questions you have with your health care provider.   Document Released: 04/05/2009 Document Revised: 12/18/2012 Document Reviewed: 11/14/2012 Elsevier Interactive Patient Education 2016 Elsevier Inc[employer]     Upper Respiratory Infection, Adult Most upper respiratory infections (URIs) are caused by a virus. A URI affects the nose, throat, and upper air passages.  The most common type of URI is often called "the common cold." HOME CARE   Take medicines only as told by your doctor.  Gargle warm saltwater or take cough drops to comfort your throat as told by your doctor.  Use a warm mist humidifier or inhale steam from a shower to increase air moisture. This may make it easier to breathe.  Drink enough fluid to keep your pee (urine) clear or pale yellow.  Eat soups and other clear broths.  Have a healthy diet.  Rest as needed.  Go back to work when your fever is gone or your doctor says it is okay.  You may need to stay home longer to avoid giving your URI to others.  You can also wear a face mask and wash your hands often to prevent spread of the virus.  Use your inhaler more if you have asthma.  Do not use any tobacco products, including cigarettes, chewing tobacco, or electronic cigarettes. If you need help quitting, ask your doctor. GET HELP IF:  You are getting worse, not better.  Your symptoms are not helped by medicine.  You have chills.  You are getting more short of breath.  You have brown or red mucus.  You have yellow or brown discharge from your nose.  You have pain in your face, especially when you bend forward.  You have a fever.  You have puffy (swollen) neck glands.  You  have pain while swallowing.  You have white areas in the back of your throat. GET HELP RIGHT AWAY IF:   You have very bad or constant:  Headache.  Ear pain.  Pain in your forehead, behind your eyes, and over your cheekbones (sinus pain).  Chest pain.  You have long-lasting (chronic) lung disease and any of the following:  Wheezing.  Long-lasting cough.  Coughing up blood.  A change in your usual mucus.  You have a stiff neck.  You have changes in your:  Vision.  Hearing.  Thinking.  Mood. MAKE SURE YOU:   Understand these instructions.  Will watch your condition.  Will get help right away if you are not doing  well or get worse.   This information is not intended to replace advice given to you by your health care provider. Make sure you discuss any questions you have with your health care provider.   Document Released: 10/04/2007 Document Revised: 09/01/2014 Document Reviewed: 07/23/2013 Elsevier Interactive Patient Education 2016 [place] Medications in Pregnancy   Acne: Benzoyl Peroxide Salicylic Acid  Backache/Headache: Tylenol: 2 regular strength every 4 hours OR              2 Extra strength every 6 hours  Colds/Coughs/Allergies: Benadryl (alcohol free) 25 mg every 6 hours as needed Breath right strips Claritin Cepacol throat lozenges Chloraseptic throat spray Cold-Eeze- up to three times per day Cough drops, alcohol free Flonase (by prescription only) Guaifenesin Mucinex Robitussin DM (plain only, alcohol free) Saline nasal spray/drops Sudafed (pseudoephedrine) & Actifed ** use only after [redacted] weeks gestation and if you do not have high blood pressure Tylenol Vicks Vaporub Zinc lozenges Zyrtec   Constipation: Colace Ducolax suppositories Fleet enema Glycerin suppositories Metamucil Milk of magnesia Miralax Senokot Smooth move tea  Diarrhea: Kaopectate Imodium A-D  *NO pepto Bismol  Hemorrhoids: Anusol Anusol HC Preparation H Tucks  Indigestion: Tums Maalox Mylanta Zantac  Pepcid  Insomnia: Benadryl (alcohol free) 25mg  every 6 hours as needed Tylenol PM Unisom, no Gelcaps  Leg Cramps: Tums MagGel  Nausea/Vomiting:  Bonine Dramamine Emetrol Ginger extract Sea bands Meclizine  Nausea medication to take during pregnancy:  Unisom (doxylamine succinate 25 mg tablets) Take one tablet daily at bedtime. If symptoms are not adequately controlled, the dose can be increased to a maximum recommended dose of two tablets daily (1/2 tablet in the morning, 1/2 tablet mid-afternoon and one at bedtime). Vitamin B6 100mg  tablets. Take  one tablet twice a day (up to 200 mg per day).  Skin Rashes: Aveeno products Benadryl cream or 25mg  every 6 hours as needed Calamine Lotion 1% cortisone cream  Yeast infection: Gyne-lotrimin 7 Monistat 7  Gum/tooth pain: Anbesol  **If taking multiple medications, please check labels to avoid duplicating the same active ingredients **take medication as directed on the label ** Do not exceed 4000 mg of tylenol in 24 hours **Do not take medications that contain aspirin or ibuprofen

## 2016-02-22 NOTE — [facility] Note (Addendum)
Pt C/O lower abd pain for "a while", has become worse in the last 2 days, hx of ovarian cyst.  Also C/O sore throat & dizziness, cough that also started 2 days ago.  States she had a fever last night of 102, took a vicodin for fever. Also has nausea, no vomiting or diarrhea.  Did HPT yesterday, ? Faint positive.

## 2016-02-23 LAB — GC/CHLAMYDIA PROBE AMP (~~LOC~~) NOT AT ARMC
Chlamydia: NEGATIVE
Neisseria Gonorrhea: NEGATIVE

## 2016-02-23 LAB — HIV ANTIBODY (ROUTINE TESTING W REFLEX): HIV Screen 4th Generation wRfx: NONREACTIVE

## 2016-02-24 ENCOUNTER — Telehealth: Payer: Self-pay

## 2016-02-24 NOTE — Telephone Encounter (Signed)
Patient was down for a stat beta today. She has rescheduled for 02/25/2016. Will follow up with her on 02/25/2016 regarding results.

## 2016-02-25 ENCOUNTER — Ambulatory Visit: Payer: Medicaid Other

## 2016-02-25 ENCOUNTER — Ambulatory Visit: Payer: Self-pay

## 2016-02-25 DIAGNOSIS — O3680X0 Pregnancy with inconclusive fetal viability, not applicable or unspecified: Secondary | ICD-10-CM

## 2016-02-25 LAB — HCG, QUANTITATIVE, PREGNANCY: HCG, BETA CHAIN, QUANT, S: 131 m[IU]/mL — AB (ref <5)

## 2016-02-25 LAB — CULTURE, GROUP A STREP (THRC)

## 2016-02-25 NOTE — Progress Notes (Signed)
Patient presented to office today for STAT beta, Currently patient is not having any pain and her bleeding has stop. Per Dr.Anyanwu patient should returned to our office for a beta (NOT STAT) IN 7 DAYSto make sure her levels are continuing to rise. Appointment schedule for 03/03/2016 for a repeat beta. Patient verbalizes understanding at this time.

## 2016-03-03 ENCOUNTER — Other Ambulatory Visit: Payer: Self-pay

## 2016-03-06 ENCOUNTER — Encounter (HOSPITAL_COMMUNITY): Payer: Self-pay | Admitting: Family Medicine

## 2016-03-06 ENCOUNTER — Emergency Department (HOSPITAL_COMMUNITY)
Admission: EM | Admit: 2016-03-06 | Discharge: 2016-03-07 | Disposition: A | Discharge status: Home / Self Care | Payer: Medicaid Other | Attending: Emergency Medicine | Admitting: Emergency Medicine

## 2016-03-06 ENCOUNTER — Emergency Department (HOSPITAL_COMMUNITY): Payer: Medicaid Other

## 2016-03-06 DIAGNOSIS — R102 Pelvic and perineal pain unspecified side: Secondary | ICD-10-CM

## 2016-03-06 DIAGNOSIS — O99341 Other mental disorders complicating pregnancy, first trimester: Secondary | ICD-10-CM | POA: Diagnosis not present

## 2016-03-06 DIAGNOSIS — O0281 Inappropriate change in quantitative human chorionic gonadotropin (hCG) in early pregnancy: Secondary | ICD-10-CM | POA: Insufficient documentation

## 2016-03-06 DIAGNOSIS — Z3491 Encounter for supervision of normal pregnancy, unspecified, first trimester: Secondary | ICD-10-CM

## 2016-03-06 DIAGNOSIS — F17210 Nicotine dependence, cigarettes, uncomplicated: Secondary | ICD-10-CM | POA: Diagnosis not present

## 2016-03-06 DIAGNOSIS — R1030 Lower abdominal pain, unspecified: Secondary | ICD-10-CM

## 2016-03-06 DIAGNOSIS — Z3A01 Less than 8 weeks gestation of pregnancy: Secondary | ICD-10-CM | POA: Diagnosis not present

## 2016-03-06 DIAGNOSIS — O26891 Other specified pregnancy related conditions, first trimester: Secondary | ICD-10-CM | POA: Insufficient documentation

## 2016-03-06 DIAGNOSIS — O99331 Smoking (tobacco) complicating pregnancy, first trimester: Secondary | ICD-10-CM | POA: Insufficient documentation

## 2016-03-06 LAB — LIPASE, BLOOD: Lipase: 29 U/L (ref 11–51)

## 2016-03-06 LAB — URINALYSIS, ROUTINE W REFLEX MICROSCOPIC
BILIRUBIN URINE: NEGATIVE
GLUCOSE, UA: NEGATIVE mg/dL
Hgb urine dipstick: NEGATIVE
KETONES UR: NEGATIVE mg/dL
LEUKOCYTES UA: NEGATIVE
NITRITE: NEGATIVE
PH: 6 (ref 5.0–8.0)
PROTEIN: NEGATIVE mg/dL
Specific Gravity, Urine: 1.017 (ref 1.005–1.030)

## 2016-03-06 LAB — COMPREHENSIVE METABOLIC PANEL
ALBUMIN: 3.9 g/dL (ref 3.5–5.0)
ALK PHOS: 55 U/L (ref 38–126)
ALT: 13 U/L — AB (ref 14–54)
AST: 18 U/L (ref 15–41)
Anion gap: 9 (ref 5–15)
BILIRUBIN TOTAL: 0.6 mg/dL (ref 0.3–1.2)
BUN: 11 mg/dL (ref 6–20)
CO2: 21 mmol/L — ABNORMAL LOW (ref 22–32)
CREATININE: 1.01 mg/dL — AB (ref 0.44–1.00)
Calcium: 9 mg/dL (ref 8.9–10.3)
Chloride: 108 mmol/L (ref 101–111)
GFR calc Af Amer: >60 mL/min (ref >60)
GLUCOSE: 113 mg/dL — AB (ref 65–99)
POTASSIUM: 3.5 mmol/L (ref 3.5–5.1)
Sodium: 138 mmol/L (ref 135–145)
TOTAL PROTEIN: 7.2 g/dL (ref 6.5–8.1)

## 2016-03-06 LAB — CBC
HEMATOCRIT: 34.8 % — AB (ref 36.0–46.0)
Hemoglobin: 11.7 g/dL — ABNORMAL LOW (ref 12.0–15.0)
MCH: 28.2 pg (ref 26.0–34.0)
MCHC: 33.6 g/dL (ref 30.0–36.0)
MCV: 83.9 fL (ref 78.0–100.0)
PLATELETS: 285 10*3/uL (ref 150–400)
RBC: 4.15 MIL/uL (ref 3.87–5.11)
RDW: 14 % (ref 11.5–15.5)
WBC: 10.3 10*3/uL (ref 4.0–10.5)

## 2016-03-06 LAB — HCG, QUANTITATIVE, PREGNANCY: hCG, Beta Chain, Quant, S: 13265 m[IU]/mL — ABNORMAL HIGH (ref <5)

## 2016-03-06 NOTE — ED Triage Notes (Signed)
Patient reports she is experiencing lower abd pain that has been occurring but got worse yesterday. Also, found out a couple of weeks ago she was pregnant. But unknown of how pregnant she is. Furthermore, she is anxious for unknown reason but reports she is getting little sleep. No vaginal bleeding or discharge. Reports vaginal odor.

## 2016-03-06 NOTE — ED Provider Notes (Signed)
[facility] DEPT Provider Note   CSN: [id number] Arrival date & time: 03/06/16  1932 By signing my name below, I, Dyke Brackett, attest that this documentation has been prepared under the direction and in the presence of non-physician practitioner, Gay Filler, PA-C  Electronically Signed: Dyke Brackett, Scribe. 03/06/2016. 10:58 PM.   History   Chief Complaint Chief Complaint  Patient presents with  . Abdominal Pain  . Anxiety    HPI Robin Horn is a 30 y.o. female with hx of anxiety, depression, gonorrhea, chlamydia, and herpes genitalia who presents to the Emergency Department complaining of depression onset a few weeks ago after finding out she was pregnant. She notes associated anxiety, agitation, loss of concentration, loss of appetite, sleep disturbance, hallucinations and blurred vision. She has visual hallucinations of shadows. She denies auditory hallucinations. She denies SI/HI. She was hospitalized for depression a few months ago. Pt was taking Seroquel which helped her to sleep, but she discontinued the medication due to weight gain. Pt has also taken tylenol PM with some relief of sleep disturbance, but is not taking any at this time. Pt is scheduled to see her regular doctor and is requesting something for sleep until Wednesday.   She also complains of suprapubic abdominal pain x 2 days ago. Pt describes the pain as pressure. Pt notes some white vaginal discharge and nausea in the mornings. She is sexually active with one female partner in the last 6 months. They do not use barrier protection. Patient does report she recently found out she was pregnant but is unsure how far along. Per review of medical records patient was seen at [facility] on 10/24 with +UPT, hcg 33, OB US did not show IUP at that time and recommended close follow up. She was seen again on 10/27, repeat hcg 131, recommended repeat in 7 days. She is a current 1/2 pack/day smoker, but denies alcohol or drug use. She  denies fever, SOB, CP, LOC, vaginal bleeding, vaginal pain, vaginal itching, hematuria, dysuria, or fever.   The history is provided by the patient. No language interpreter was used.   Past Medical History:  Diagnosis Date  . Abnormal Pap smear of cervix   . Anxiety   . Chlamydia   . Depression   . Gonorrhea   . Herpes genitalia   . Migraines   . Ovarian cyst   . Preeclampsia     Patient Active Problem List   Diagnosis Date Noted  . Gonorrhea     Past Surgical History:  Procedure Laterality Date  . COLPOSCOPY    . DILATION AND CURETTAGE OF UTERUS    . TONSILLECTOMY      OB History    Gravida Para Term Preterm AB Living   8 4 4  0 3 4   SAB TAB Ectopic Multiple Live Births   0 3 0 0 4       Home Medications    Prior to Admission medications   Medication Sig Start Date End Date Taking? Authorizing Provider  acetaminophen (TYLENOL) 500 MG tablet Take 1,000 mg by mouth every 6 (six) hours as needed for mild pain, moderate pain or headache.    Yes Historical Provider, MD  benzonatate (TESSALON) 100 MG capsule Take 1 capsule (100 mg total) by mouth every 8 (eight) hours. Patient not taking: Reported on 03/06/2016 02/22/16   Jorje Guild, NP  HYDROcodone-acetaminophen (NORCO/VICODIN) 5-325 MG tablet Take 1-2 tablets by mouth every 6 (six) hours as needed for moderate pain. Patient  not taking: Reported on 03/06/2016 12/07/15   Luvenia Redden, PA-C  promethazine (PHENERGAN) 25 MG tablet Take 1 tablet (25 mg total) by mouth every 6 (six) hours as needed for nausea or vomiting. Patient not taking: Reported on 03/06/2016 02/22/16   Jorje Guild, NP    Family History Family History  Problem Relation Age of Onset  . Kidney disease Mother   . Cancer Paternal Grandmother   . Other Neg Hx     Social History Social History  Substance Use Topics  . Smoking status: Current Every Day Smoker    Packs/day: 0.25    Types: Cigarettes  . Smokeless tobacco: Never Used  . Alcohol  use 0.6 oz/week    1 Glasses of wine per week     Comment: Once every 2 months. Nothing since finding out about pregnancy.      Allergies   Lactose intolerance (gi)  Review of Systems Review of Systems  Constitutional: Positive for appetite change. Negative for chills, diaphoresis and fever.  HENT: Negative for trouble swallowing.   Respiratory: Negative for shortness of breath.   Cardiovascular: Negative for chest pain.  Gastrointestinal: Positive for abdominal pain ( suprapubic) and nausea. Negative for blood in stool, constipation, diarrhea and vomiting.  Genitourinary: Positive for vaginal discharge. Negative for dysuria, hematuria, vaginal bleeding and vaginal pain.  Musculoskeletal: Negative for neck pain.  Skin: Negative for rash.  Neurological: Negative for headaches.  Psychiatric/Behavioral: Positive for agitation, hallucinations and sleep disturbance. Negative for suicidal ideas. The patient is nervous/anxious.    Physical Exam Updated Vital Signs BP 120/84   Pulse 88   Temp 98.6 F (37 C) (Oral)   Resp 16   Ht 5\' 3"  (1.6 m)   Wt 76.4 kg   LMP 01/14/2016 (Approximate)   SpO2 100%   BMI 29.83 kg/m   Physical Exam  Constitutional: She appears well-developed and well-nourished. No distress.  HENT:  Head: Normocephalic and atraumatic.  Mouth/Throat: Oropharynx is clear and moist. No oropharyngeal exudate.  Eyes: Conjunctivae and EOM are normal. Pupils are equal, round, and reactive to light. Right eye exhibits no discharge. Left eye exhibits no discharge. No scleral icterus.  Neck: Normal range of motion and phonation normal. Neck supple. No neck rigidity. Normal range of motion present.  Cardiovascular: Normal rate, regular rhythm, normal heart sounds and intact distal pulses.   No murmur heard. Pulmonary/Chest: Effort normal and breath sounds normal. No stridor. No respiratory distress. She has no wheezes. She has no rales.  Abdominal: Soft. Bowel sounds are  normal. She exhibits no distension. There is tenderness (suprapubic). There is no rigidity, no rebound, no guarding and no CVA tenderness.  Genitourinary: Pelvic exam was performed with patient supine.  Genitourinary Comments: Chaperone present for duration of exam. External anatomy normal - no injury, lesions, masses, or rashes. No bleeding, lesions, masses, or ulcerations in vaginal cavity. Cervix is closed with off-white discharge. No friability. She reports pressure in suprapubic region. No CMT, no adnexal tenderness, no masses palpated on bimanual exam.    Musculoskeletal: Normal range of motion.  Lymphadenopathy:    She has no cervical adenopathy.  Neurological: She is alert. She is not disoriented. Coordination and gait normal. GCS eye subscore is 4. GCS verbal subscore is 5. GCS motor subscore is 6.  Skin: Skin is warm and dry. She is not diaphoretic.  Psychiatric: She is withdrawn. She exhibits a depressed mood. She expresses no homicidal and no suicidal ideation. She expresses no suicidal plans.  Patient does not appear to be actively hallucinating.    ED Treatments / Results  DIAGNOSTIC STUDIES:  Oxygen Saturation is 100% on RA, normal by my interpretation.    COORDINATION OF CARE:  10:53 PM Discussed treatment plan with pt at bedside and pt agreed to plan.  Labs (all labs ordered are listed, but only abnormal results are displayed) Labs Reviewed  WET PREP, GENITAL - Abnormal; Notable for the following:       Result Value   Clue Cells Wet Prep HPF POC PRESENT (*)    WBC, Wet Prep HPF POC FEW (*)    All other components within normal limits  COMPREHENSIVE METABOLIC PANEL - Abnormal; Notable for the following:    CO2 21 (*)    Glucose, Bld 113 (*)    Creatinine, Ser 1.01 (*)    ALT 13 (*)    All other components within normal limits  CBC - Abnormal; Notable for the following:    Hemoglobin 11.7 (*)    HCT 34.8 (*)    All other components within normal limits  HCG,  QUANTITATIVE, PREGNANCY - Abnormal; Notable for the following:    hCG, Beta Chain, Quant, S 13,265 (*)    All other components within normal limits  GC/CHLAMYDIA PROBE AMP ([place]) NOT AT [facility] - Abnormal; Notable for the following:    Chlamydia **POSITIVE** (*)    All other components within normal limits  LIPASE, BLOOD  URINALYSIS, ROUTINE W REFLEX MICROSCOPIC (NOT AT [facility])    EKG  EKG Interpretation None       Radiology US Ob Comp Less 14 Wks  Result Date: 03/07/2016 CLINICAL DATA:  Positive pregnancy test. Right lower quadrant pain for 3 days. EXAM: OBSTETRIC <14 WK [place] AND TRANSVAGINAL OB US TECHNIQUE: Both transabdominal and transvaginal ultrasound examinations were performed for complete evaluation of the gestation as well as the maternal uterus, adnexal regions, and pelvic cul-de-sac. Transvaginal technique was performed to assess early pregnancy. COMPARISON:  None. FINDINGS: Intrauterine gestational sac: Single Yolk sac:  Visualized. Embryo:  Not Visualized. Cardiac Activity: Not applicable Heart Rate: Not applicable MSD: [id number]  mm   6 w   0  d Subchorionic hemorrhage: 8 x 5 x 8 mm hypoechoic focus adjacent to the gestational sac may represent a small parrot implantation bleed. Maternal uterus/adnexae: Possible corpus luteum on the left measuring 2.5 x 2.5 x 2.1 cm. IMPRESSION: There is an intrauterine gestational sac and yolk sac but no fetal pole at this time. Probable small peri-implantation bleed. Recommend follow-up quantitative B-HCG levels and follow-up US in 14 days to confirm and assess viability. This recommendation follows SRU consensus guidelines: Diagnostic Criteria for Nonviable Pregnancy Early in the First Trimester. [place] Med [id number]. Electronically Signed   By:  Royalty M.D.   On: 03/07/2016 00:00   US Ob Transvaginal  Result Date: 03/07/2016 CLINICAL DATA:  Positive pregnancy test. Right lower quadrant pain for 3 days. EXAM: OBSTETRIC <14 WK [place] AND  TRANSVAGINAL OB US TECHNIQUE: Both transabdominal and transvaginal ultrasound examinations were performed for complete evaluation of the gestation as well as the maternal uterus, adnexal regions, and pelvic cul-de-sac. Transvaginal technique was performed to assess early pregnancy. COMPARISON:  None. FINDINGS: Intrauterine gestational sac: Single Yolk sac:  Visualized. Embryo:  Not Visualized. Cardiac Activity: Not applicable Heart Rate: Not applicable MSD: [id number]  mm   6 w   0  d Subchorionic hemorrhage: 8 x 5 x 8 mm hypoechoic focus adjacent  to the gestational sac may represent a small parrot implantation bleed. Maternal uterus/adnexae: Possible corpus luteum on the left measuring 2.5 x 2.5 x 2.1 cm. IMPRESSION: There is an intrauterine gestational sac and yolk sac but no fetal pole at this time. Probable small peri-implantation bleed. Recommend follow-up quantitative B-HCG levels and follow-up US in 14 days to confirm and assess viability. This recommendation follows SRU consensus guidelines: Diagnostic Criteria for Nonviable Pregnancy Early in the First Trimester. [employer] Med [id number]. Electronically Signed   By: Emanuel Dowson Royalty M.D.   On: 03/07/2016 00:00    Procedures Procedures (including critical care time)  Medications Ordered in ED Medications  acetaminophen (TYLENOL) tablet 650 mg (650 mg Oral Given 03/07/16 0029)     Initial Impression / Assessment and Plan / ED Course  I have reviewed the triage vital signs and the nursing notes.  Pertinent labs & imaging results that were available during my care of the patient were reviewed by me and considered in my medical decision making (see chart for details).  Clinical Course as of Mar 07 1928  Tue Mar 07, 2016  0030 [place] reviewed  [AM]    Clinical Course User Index [AM] Roxanna Mew, PA-C    Patient presents to ED with complaint of depression/anxiety and suprapubic abdominal pain. Patient is pregnant; however, unsure how far  along. Patient is afebrile and non-toxic appearing in NAD. VSS. Patient endorses seeing shadows, denies auditory hallucinations, does not appear to be actively hallucinating at this time. Denies SI/HI. TTP in suprapubic region without guarding, rigidity, or peritoneal signs. White discharge on pelvic exam and pain with palpation of suprapubic region; no CMT or adnexal tenderness. Will check basic labs and hcg. TTS consult placed. Tylenol given for pain relief.   CBC, lipase, and CMP grossly nml. U/A negative for infection. HCG ~13K, US shows 6wk0day intrauterine gestational sac and yolk sac; however, no fetal pole or cardiac activity; recommend f/u HCG testing and [place] in 14 days to assess for viability. Patient requesting something for sleep. Discussed with patient current sleep aids are not recommended in pregnancy. Recommended follow up with OBGYN for determining an appropriate medication. Recommended tylenol for pain relief during pregnancy.    Patient states that her ride is here and she is leaving. Her wet prep results have not yet processed and TTS consult not completed, encouraged pt to stay for results, but she declines. Told patient will call with results if abnormal and started on appropriate medication. Recommended follow up with OBGYN in 14 days for repeat HCG and [place]. Strict return precautions discussed. Patient voiced understanding and is agreeable.    Final Clinical Impressions(s) / ED Diagnoses   Final diagnoses:  Lower abdominal pain  First trimester pregnancy    New Prescriptions Discharge Medication List as of 03/07/2016  1:26 AM    I personally performed the services described in this documentation, which was scribed in my presence. The recorded information has been reviewed and is accurate.    Roxanna Mew, [place] 03/07/16 [place], MD 03/08/16 2218

## 2016-03-06 NOTE — ED Notes (Signed)
Ultrasound in progress  

## 2016-03-07 ENCOUNTER — Telehealth: Payer: Self-pay | Admitting: *Deleted

## 2016-03-07 LAB — WET PREP, GENITAL
Sperm: NONE SEEN
TRICH WET PREP: NONE SEEN
Yeast Wet Prep HPF POC: NONE SEEN

## 2016-03-07 LAB — GC/CHLAMYDIA PROBE AMP (~~LOC~~) NOT AT ARMC
CHLAMYDIA, DNA PROBE: POSITIVE — AB
NEISSERIA GONORRHEA: NEGATIVE

## 2016-03-07 MED ORDER — ACETAMINOPHEN 325 MG PO TABS
650.0000 mg | ORAL_TABLET | Freq: Once | ORAL | Status: AC
  Administered 2016-03-07: 650 mg via ORAL
  Filled 2016-03-07: qty 2

## 2016-03-07 NOTE — Telephone Encounter (Signed)
Case reviewed due to call from pharmacy to ED CM. Pharmacy needs the name of the prescribing MD for patient's prescriptions 03/06/16. Pharmacy will call ED CM back for more information. Presenter, broadcasting

## 2016-03-07 NOTE — Discharge Instructions (Signed)
Read the information below.  Your labs are re-assuring. Your US shows an intrauterine pregnancy; however, no heart activity is visualized at this time. Recommend follow up hormone level testing and [place] in 14 days. Please call your OBGYN in the morning to schedule an appointment. They will also be a good resource for determining a safe medication to help with sleep.  Tylenol is a safe medication for pain relief during pregnancy.  Please keep your scheduled appointment with your primary doctor on Wednesday.  Use the prescribed medication as directed.  Please discuss all new medications with your pharmacist.   You may return to the Emergency Department at any time for worsening condition or any new symptoms that concern you. Return to ED if you develop fever, vaginal bleeding, inability to keep food/fluids down, blood in stool, localized abdominal pain to right lower abdomen, thoughts of hurting self or others, or any other new/concerning symptoms.

## 2016-03-07 NOTE — ED Provider Notes (Signed)
Wet prep +BV +chlamydia Suspect suprapubic pain may be secondary to STI.   7:45 PM: Spoke with patient on phone and informed patient of results. Will call [facility] on [address] for rx ABX Recommended pt notify sexual partner to be tested and treated, can go to health department. Pt voiced understanding and is agreeable.   7:53 PM: Rx called into [facility]: azithromycin 1g x once and metronidazole 500mg  BID x 7 days.   Roxanna Mew, [place] 03/07/16 [place], MD 03/08/16 [phone]

## 2016-03-08 ENCOUNTER — Emergency Department (HOSPITAL_COMMUNITY)
Admission: EM | Admit: 2016-03-08 | Discharge: 2016-03-08 | Disposition: A | Discharge status: Home / Self Care | Payer: Medicaid Other | Attending: Emergency Medicine | Admitting: Emergency Medicine

## 2016-03-08 ENCOUNTER — Encounter (HOSPITAL_COMMUNITY): Payer: Self-pay | Admitting: Family Medicine

## 2016-03-08 ENCOUNTER — Emergency Department (HOSPITAL_COMMUNITY): Payer: Medicaid Other

## 2016-03-08 DIAGNOSIS — O4690 Antepartum hemorrhage, unspecified, unspecified trimester: Secondary | ICD-10-CM

## 2016-03-08 DIAGNOSIS — Z3A01 Less than 8 weeks gestation of pregnancy: Secondary | ICD-10-CM | POA: Insufficient documentation

## 2016-03-08 DIAGNOSIS — F17210 Nicotine dependence, cigarettes, uncomplicated: Secondary | ICD-10-CM | POA: Insufficient documentation

## 2016-03-08 DIAGNOSIS — O0281 Inappropriate change in quantitative human chorionic gonadotropin (hCG) in early pregnancy: Secondary | ICD-10-CM | POA: Diagnosis not present

## 2016-03-08 DIAGNOSIS — O99331 Smoking (tobacco) complicating pregnancy, first trimester: Secondary | ICD-10-CM | POA: Diagnosis not present

## 2016-03-08 DIAGNOSIS — A749 Chlamydial infection, unspecified: Secondary | ICD-10-CM

## 2016-03-08 DIAGNOSIS — O209 Hemorrhage in early pregnancy, unspecified: Secondary | ICD-10-CM | POA: Diagnosis present

## 2016-03-08 DIAGNOSIS — O98819 Other maternal infectious and parasitic diseases complicating pregnancy, unspecified trimester: Secondary | ICD-10-CM

## 2016-03-08 DIAGNOSIS — O23591 Infection of other part of genital tract in pregnancy, first trimester: Secondary | ICD-10-CM | POA: Insufficient documentation

## 2016-03-08 DIAGNOSIS — N939 Abnormal uterine and vaginal bleeding, unspecified: Secondary | ICD-10-CM

## 2016-03-08 LAB — HCG, QUANTITATIVE, PREGNANCY: hCG, Beta Chain, Quant, S: 18379 m[IU]/mL — ABNORMAL HIGH (ref <5)

## 2016-03-08 MED ORDER — AZITHROMYCIN 250 MG PO TABS
1000.0000 mg | ORAL_TABLET | Freq: Once | ORAL | Status: AC
  Administered 2016-03-08: 1000 mg via ORAL
  Filled 2016-03-08: qty 4

## 2016-03-08 MED ORDER — ACETAMINOPHEN 500 MG PO TABS
1000.0000 mg | ORAL_TABLET | Freq: Once | ORAL | Status: AC
  Administered 2016-03-08: 1000 mg via ORAL
  Filled 2016-03-08: qty 2

## 2016-03-08 NOTE — ED Provider Notes (Addendum)
[facility] DEPT Provider Note   CSN: [id number] Arrival date & time: 03/08/16  1535     History   Chief Complaint Chief Complaint  Patient presents with  . Routine Prenatal Visit  . Vaginal Bleeding    HPI Robin Horn is a 30 y.o. female.  Patient is a 30 year old female with a history of STI, preeclampsia, ovarian cysts presenting today with vaginal bleeding. Patient is currently [redacted] weeks pregnant improved on a pelvic ultrasound done 2 days ago. Patient was contacted yesterday and told she had chlamydia and is currently not taking any antibiotics. She states that the discharge is getting worse but starting yesterday she has had intermittent vaginal bleeding with some clot passage. She denies any fever or vomiting.     The history is provided by the patient.  Vaginal Bleeding  Primary symptoms include discharge, pelvic pain, genital odor, vaginal bleeding. There has been no fever.    Past Medical History:  Diagnosis Date  . Abnormal Pap smear of cervix   . Anxiety   . Chlamydia   . Depression   . Gonorrhea   . Herpes genitalia   . Migraines   . Ovarian cyst   . Preeclampsia     Patient Active Problem List   Diagnosis Date Noted  . Gonorrhea     Past Surgical History:  Procedure Laterality Date  . COLPOSCOPY    . DILATION AND CURETTAGE OF UTERUS    . TONSILLECTOMY      OB History    Gravida Para Term Preterm AB Living   8 4 4  0 3 4   SAB TAB Ectopic Multiple Live Births   0 3 0 0 4       Home Medications    Prior to Admission medications   Medication Sig Start Date End Date Taking? Authorizing Provider  acetaminophen (TYLENOL) 500 MG tablet Take 1,000 mg by mouth every 6 (six) hours as needed for mild pain, moderate pain or headache.    Yes Historical Provider, MD  benzonatate (TESSALON) 100 MG capsule Take 1 capsule (100 mg total) by mouth every 8 (eight) hours. Patient not taking: Reported on 03/08/2016 02/22/16   Jorje Guild, NP    HYDROcodone-acetaminophen (NORCO/VICODIN) 5-325 MG tablet Take 1-2 tablets by mouth every 6 (six) hours as needed for moderate pain. Patient not taking: Reported on 03/08/2016 12/07/15   Luvenia Redden, PA-C  promethazine (PHENERGAN) 25 MG tablet Take 1 tablet (25 mg total) by mouth every 6 (six) hours as needed for nausea or vomiting. Patient not taking: Reported on 03/08/2016 02/22/16   Jorje Guild, NP    Family History Family History  Problem Relation Age of Onset  . Kidney disease Mother   . Cancer Paternal Grandmother   . Other Neg Hx     Social History Social History  Substance Use Topics  . Smoking status: Current Every Day Smoker    Packs/day: 0.25    Types: Cigarettes  . Smokeless tobacco: Never Used  . Alcohol use 0.6 oz/week    1 Glasses of wine per week     Comment: Once every 2 months. Nothing since finding out about pregnancy.      Allergies   Lactose intolerance (gi)   Review of Systems Review of Systems  Genitourinary: Positive for pelvic pain and vaginal bleeding.  All other systems reviewed and are negative.    Physical Exam Updated Vital Signs BP (!) 146/107 (BP Location: Right Arm)   Pulse 95  Temp 98.8 F (37.1 C) (Oral)   Resp 18   Ht 5\' 3"  (1.6 m)   Wt 165 lb (74.8 kg)   LMP 01/14/2016 (Approximate)   SpO2 100%   BMI 29.23 kg/m   Physical Exam  Constitutional: She is oriented to person, place, and time. She appears well-developed and well-nourished. No distress.  HENT:  Head: Normocephalic and atraumatic.  Mouth/Throat: Oropharynx is clear and moist.  Eyes: Conjunctivae and EOM are normal. Pupils are equal, round, and reactive to light.  Neck: Normal range of motion. Neck supple.  Cardiovascular: Normal rate, regular rhythm and intact distal pulses.   No murmur heard. Pulmonary/Chest: Effort normal and breath sounds normal. No respiratory distress. She has no wheezes. She has no rales.  Abdominal: Soft. She exhibits no distension.  There is tenderness in the suprapubic area. There is no rebound, no guarding and no CVA tenderness.  Genitourinary: Uterus is enlarged. Cervix exhibits motion tenderness and discharge. Right adnexum displays tenderness. Left adnexum displays tenderness. Vaginal discharge found.  Musculoskeletal: Normal range of motion. She exhibits no edema or tenderness.  Neurological: She is alert and oriented to person, place, and time.  Skin: Skin is warm and dry. No rash noted. No erythema.  Psychiatric: She has a normal mood and affect. Her behavior is normal.  Nursing note and vitals reviewed.    ED Treatments / Results  Labs (all labs ordered are listed, but only abnormal results are displayed) Labs Reviewed  HCG, QUANTITATIVE, PREGNANCY - Abnormal; Notable for the following:       Result Value   hCG, Beta Chain, Quant, S 18,379 (*)    All other components within normal limits    EKG  EKG Interpretation None       Radiology US Ob Comp Less 14 Wks  Result Date: 03/08/2016 CLINICAL DATA:  Vaginal bleeding EXAM: OBSTETRIC <14 WK [place] AND TRANSVAGINAL OB US TECHNIQUE: Both transabdominal and transvaginal ultrasound examinations were performed for complete evaluation of the gestation as well as the maternal uterus, adnexal regions, and pelvic cul-de-sac. Transvaginal technique was performed to assess early pregnancy. COMPARISON:  March 06, 2016 FINDINGS: Intrauterine gestational sac: Visualized Yolk sac:  Visualized Embryo:  Visualized Cardiac Activity: Not visualized CRL:  3  mm   6 w   0 d Subchorionic hemorrhage: There is a minimal subchorionic hemorrhage measuring 4 x 3 mm. Maternal uterus/adnexae: There is a hypoechoic mass within the uterus measuring 8 x 7 x 7 mm cyst with a small leiomyoma. Cervical os is closed. There is a collapsing corpus luteum in the left ovary measuring 2.4 x 2.1 x 2.2 cm. Adnexal structures/ovaries otherwise appear normal. There is trace free pelvic fluid. IMPRESSION:  Within the uterus, there is a gestational sac containing a yolk sac and what is felt to represent a very small fetal pole. Fetal heart activity not appreciated at this time. Given this circumstance, advise repeat study in approximately 10-14 days to assess for fetal cardiac activity. There is a minimal subchorionic hemorrhage. There is a sub cm leiomyoma within the uterus. There is a collapsing corpus luteum in the left ovary. Trace free pelvic fluid may be physiologic. Electronically Signed   By: Lowella Grip III M.D.   On: 03/08/2016 17:59   US Ob Comp Less 14 Wks  Result Date: 03/07/2016 CLINICAL DATA:  Positive pregnancy test. Right lower quadrant pain for 3 days. EXAM: OBSTETRIC <14 WK [place] AND TRANSVAGINAL OB US TECHNIQUE: Both transabdominal and transvaginal ultrasound examinations  were performed for complete evaluation of the gestation as well as the maternal uterus, adnexal regions, and pelvic cul-de-sac. Transvaginal technique was performed to assess early pregnancy. COMPARISON:  None. FINDINGS: Intrauterine gestational sac: Single Yolk sac:  Visualized. Embryo:  Not Visualized. Cardiac Activity: Not applicable Heart Rate: Not applicable MSD: [id number]  mm   6 w   0  d Subchorionic hemorrhage: 8 x 5 x 8 mm hypoechoic focus adjacent to the gestational sac may represent a small parrot implantation bleed. Maternal uterus/adnexae: Possible corpus luteum on the left measuring 2.5 x 2.5 x 2.1 cm. IMPRESSION: There is an intrauterine gestational sac and yolk sac but no fetal pole at this time. Probable small peri-implantation bleed. Recommend follow-up quantitative B-HCG levels and follow-up US in 14 days to confirm and assess viability. This recommendation follows SRU consensus guidelines: Diagnostic Criteria for Nonviable Pregnancy Early in the First Trimester. [employer] Med [id number]. Electronically Signed   By: Ashley Royalty M.D.   On: 03/07/2016 00:00   US Ob Transvaginal  Result Date:  03/08/2016 CLINICAL DATA:  Vaginal bleeding EXAM: OBSTETRIC <14 WK [place] AND TRANSVAGINAL OB US TECHNIQUE: Both transabdominal and transvaginal ultrasound examinations were performed for complete evaluation of the gestation as well as the maternal uterus, adnexal regions, and pelvic cul-de-sac. Transvaginal technique was performed to assess early pregnancy. COMPARISON:  March 06, 2016 FINDINGS: Intrauterine gestational sac: Visualized Yolk sac:  Visualized Embryo:  Visualized Cardiac Activity: Not visualized CRL:  3  mm   6 w   0 d Subchorionic hemorrhage: There is a minimal subchorionic hemorrhage measuring 4 x 3 mm. Maternal uterus/adnexae: There is a hypoechoic mass within the uterus measuring 8 x 7 x 7 mm cyst with a small leiomyoma. Cervical os is closed. There is a collapsing corpus luteum in the left ovary measuring 2.4 x 2.1 x 2.2 cm. Adnexal structures/ovaries otherwise appear normal. There is trace free pelvic fluid. IMPRESSION: Within the uterus, there is a gestational sac containing a yolk sac and what is felt to represent a very small fetal pole. Fetal heart activity not appreciated at this time. Given this circumstance, advise repeat study in approximately 10-14 days to assess for fetal cardiac activity. There is a minimal subchorionic hemorrhage. There is a sub cm leiomyoma within the uterus. There is a collapsing corpus luteum in the left ovary. Trace free pelvic fluid may be physiologic. Electronically Signed   By: Lowella Grip III M.D.   On: 03/08/2016 17:59   US Ob Transvaginal  Result Date: 03/07/2016 CLINICAL DATA:  Positive pregnancy test. Right lower quadrant pain for 3 days. EXAM: OBSTETRIC <14 WK [place] AND TRANSVAGINAL OB US TECHNIQUE: Both transabdominal and transvaginal ultrasound examinations were performed for complete evaluation of the gestation as well as the maternal uterus, adnexal regions, and pelvic cul-de-sac. Transvaginal technique was performed to assess early pregnancy.  COMPARISON:  None. FINDINGS: Intrauterine gestational sac: Single Yolk sac:  Visualized. Embryo:  Not Visualized. Cardiac Activity: Not applicable Heart Rate: Not applicable MSD: [id number]  mm   6 w   0  d Subchorionic hemorrhage: 8 x 5 x 8 mm hypoechoic focus adjacent to the gestational sac may represent a small parrot implantation bleed. Maternal uterus/adnexae: Possible corpus luteum on the left measuring 2.5 x 2.5 x 2.1 cm. IMPRESSION: There is an intrauterine gestational sac and yolk sac but no fetal pole at this time. Probable small peri-implantation bleed. Recommend follow-up quantitative B-HCG levels and follow-up US in 14 days  to confirm and assess viability. This recommendation follows SRU consensus guidelines: Diagnostic Criteria for Nonviable Pregnancy Early in the First Trimester. [employer] Med [id number]. Electronically Signed   By: Ashley Royalty M.D.   On: 03/07/2016 00:00    Procedures Procedures (including critical care time)  Medications Ordered in ED Medications  azithromycin (ZITHROMAX) tablet 1,000 mg (1,000 mg Oral Given 03/08/16 1740)  acetaminophen (TYLENOL) tablet 1,000 mg (1,000 mg Oral Given 03/08/16 1740)     Initial Impression / Assessment and Plan / ED Course  I have reviewed the triage vital signs and the nursing notes.  Pertinent labs & imaging results that were available during my care of the patient were reviewed by me and considered in my medical decision making (see chart for details).  Clinical Course    Patient who is currently patient is a 30-year-gestational pregnancy. Marland Kitchenold female who is currently [redacted] weeks pregnant presenting today for vaginal bleeding that's been intermittent all day long. Patient was seen 2 days ago and at that time was diagnosed with an early 6 week. Her hCG at that time was 15,000.  Patient was also contacted yesterday and told she had Chlamydia but she is currently not taking any antibiotics for that. On exam today patient has lower  abdominal tenderness mostly suprapubic with generalized tenderness on pelvic exam with thick yellow discharge. No bleeding at this time and os is closed.  Pt is AB+. Ultrasound today shows gestational and yolk sac consistent with a 6 week pregnancy. No fetal activity at this time but most likely too early. Minimal subchorionic hemorrhage but could be the cause of bleeding. No evidence of tubo-ovarian abscess or other complications. Patient hCG today is increasing and is now greater than 18,000. Patient was treated for chlamydia with azithromycin. Her gonorrhea was negative. Encouraged patient to follow-up with OB/GYN.  Final Clinical Impressions(s) / ED Diagnoses   Final diagnoses:  Vaginal bleeding in pregnancy  Chlamydia infection during pregnancy    New Prescriptions New Prescriptions   No medications on file     Blanchie Dessert, MD 03/08/16 1814    Blanchie Dessert, MD 03/08/16 1829

## 2016-03-08 NOTE — ED Triage Notes (Signed)
Patient reports she is [redacted] weeks pregnant and started experiencing vaginal bleeding last night. Started last night as spotting but this morning started with clots. Pt was seen 2 days ago for vaginal discharge and anxiety. Pt was diagnosed with chlamydia. Pt reports she took her medication to pharmacy but was unable to get it filled due to missing signature from the prescription. Pharmacy was suppose to notify patient when medication was ready.

## 2016-04-03 ENCOUNTER — Inpatient Hospital Stay (HOSPITAL_COMMUNITY): Payer: Medicaid Other

## 2016-04-03 ENCOUNTER — Inpatient Hospital Stay (HOSPITAL_COMMUNITY)
Admission: AD | Admit: 2016-04-03 | Discharge: 2016-04-03 | Disposition: A | Discharge status: Home / Self Care | Payer: Medicaid Other | Source: Ambulatory Visit | Attending: Family Medicine | Admitting: Family Medicine

## 2016-04-03 ENCOUNTER — Encounter (HOSPITAL_COMMUNITY): Payer: Self-pay

## 2016-04-03 DIAGNOSIS — N939 Abnormal uterine and vaginal bleeding, unspecified: Secondary | ICD-10-CM

## 2016-04-03 DIAGNOSIS — O9989 Other specified diseases and conditions complicating pregnancy, childbirth and the puerperium: Secondary | ICD-10-CM | POA: Diagnosis not present

## 2016-04-03 DIAGNOSIS — Z3A09 9 weeks gestation of pregnancy: Secondary | ICD-10-CM | POA: Insufficient documentation

## 2016-04-03 DIAGNOSIS — F17210 Nicotine dependence, cigarettes, uncomplicated: Secondary | ICD-10-CM | POA: Diagnosis not present

## 2016-04-03 DIAGNOSIS — K219 Gastro-esophageal reflux disease without esophagitis: Secondary | ICD-10-CM | POA: Diagnosis not present

## 2016-04-03 DIAGNOSIS — O30031 Twin pregnancy, monochorionic/diamniotic, first trimester: Secondary | ICD-10-CM | POA: Diagnosis not present

## 2016-04-03 DIAGNOSIS — O99611 Diseases of the digestive system complicating pregnancy, first trimester: Secondary | ICD-10-CM | POA: Insufficient documentation

## 2016-04-03 DIAGNOSIS — O99331 Smoking (tobacco) complicating pregnancy, first trimester: Secondary | ICD-10-CM | POA: Diagnosis not present

## 2016-04-03 DIAGNOSIS — N730 Acute parametritis and pelvic cellulitis: Secondary | ICD-10-CM

## 2016-04-03 DIAGNOSIS — N739 Female pelvic inflammatory disease, unspecified: Secondary | ICD-10-CM | POA: Diagnosis not present

## 2016-04-03 DIAGNOSIS — N898 Other specified noninflammatory disorders of vagina: Secondary | ICD-10-CM | POA: Diagnosis present

## 2016-04-03 LAB — WET PREP, GENITAL
CLUE CELLS WET PREP: NONE SEEN
SPERM: NONE SEEN
TRICH WET PREP: NONE SEEN
Yeast Wet Prep HPF POC: NONE SEEN

## 2016-04-03 MED ORDER — AZITHROMYCIN 250 MG PO TABS
1000.0000 mg | ORAL_TABLET | Freq: Every day | ORAL | Status: DC
  Administered 2016-04-03: 1000 mg via ORAL

## 2016-04-03 MED ORDER — DOXYCYCLINE HYCLATE 100 MG PO TABS: 100.0000 mg | ORAL_TABLET | Freq: Two times a day (BID) | ORAL | Status: DC

## 2016-04-03 MED ORDER — PRE-NATAL FORMULA PO TABS: 1.0000 | ORAL_TABLET | Freq: Every day | ORAL | 6 refills | Status: DC

## 2016-04-03 MED ORDER — AZITHROMYCIN 500 MG PO TABS: 1000.0000 mg | ORAL_TABLET | Freq: Every day | ORAL | 0 refills | Status: DC

## 2016-04-03 MED ORDER — RANITIDINE HCL 150 MG PO TABS: 150.0000 mg | ORAL_TABLET | Freq: Two times a day (BID) | ORAL | 6 refills | Status: DC

## 2016-04-03 MED ORDER — CEFTRIAXONE SODIUM 1 G IJ SOLR: 500.0000 mg | Freq: Once | INTRAMUSCULAR | Status: DC

## 2016-04-03 MED ORDER — FAMOTIDINE 20 MG PO TABS
20.0000 mg | ORAL_TABLET | Freq: Once | ORAL | Status: AC
  Administered 2016-04-03: 20 mg via ORAL
  Filled 2016-04-03: qty 1

## 2016-04-03 MED ORDER — CEFTRIAXONE SODIUM 250 MG IJ SOLR
250.0000 mg | Freq: Once | INTRAMUSCULAR | Status: AC
  Administered 2016-04-03: 250 mg via INTRAMUSCULAR
  Filled 2016-04-03: qty 250

## 2016-04-03 MED ORDER — AZITHROMYCIN 250 MG PO TABS
1000.0000 mg | ORAL_TABLET | Freq: Once | ORAL | Status: AC
  Filled 2016-04-03: qty 4

## 2016-04-03 NOTE — MAU Note (Signed)
Pt c/o spotting and lower abdominal pain that started three days ago. Pt states the pain has been getting progressive worse. Pt states she is also having some discharge that is different than normal.

## 2016-04-03 NOTE — MAU Provider Note (Signed)
History     CSN: [id number]  Arrival date and time: 04/03/16 [id number]   First Provider Initiated Contact with Patient 04/03/16 1005      Chief Complaint  Patient presents with  . Vaginal Bleeding  . Abdominal Pain   Patient is a 30 year old G8 P4 at 9 weeks and 1 day who presents with abdominal pain vaginal discharge and spotting. She reports the spotting been going on for several days the cramping pain has been persistent for about a week. She also has some epigastric pain with nausea and radiation up into her chest but no vomiting. She denies fevers or chills. She has reports she last had sex about 1 week ago. She did have positive chlamydia approximately a month ago which she reports she took her azithromycin for. She does report her boyfriend has cheated on her since then she is not sure if he get treated after the first infection.   Vaginal Bleeding  The patient's primary symptoms include genital itching, genital lesions, a genital odor, pelvic pain and vaginal discharge. This is a new problem. The current episode started in the past 7 days. The problem occurs constantly. The problem has been rapidly worsening. The pain is mild. The problem affects both sides. She is pregnant. Pertinent negatives include no flank pain, joint swelling, painful intercourse, rash, sore throat or urgency. The vaginal discharge was brown and dark. The vaginal bleeding is spotting. She has not been passing clots. She has not been passing tissue. She is sexually active. Yes, her partner has an STD. She uses nothing for contraception. Her menstrual history has been regular.    OB History    Gravida Para Term Preterm AB Living   8 4 4  0 3 4   SAB TAB Ectopic Multiple Live Births   0 3 0 0 4      Past Medical History:  Diagnosis Date  . Abnormal Pap smear of cervix   . Anxiety   . Chlamydia   . Depression   . Gonorrhea   . Herpes genitalia   . Migraines   . Ovarian cyst   . Preeclampsia     Past  Surgical History:  Procedure Laterality Date  . COLPOSCOPY    . DILATION AND CURETTAGE OF UTERUS    . TONSILLECTOMY      Family History  Problem Relation Age of Onset  . Kidney disease Mother   . Cancer Paternal Grandmother   . Other Neg Hx     Social History  Substance Use Topics  . Smoking status: Current Every Day Smoker    Packs/day: 0.25    Types: Cigarettes  . Smokeless tobacco: Never Used  . Alcohol use 0.6 oz/week    1 Glasses of wine per week     Comment: Once every 2 months. Nothing since finding out about pregnancy.     Allergies:  Allergies  Allergen Reactions  . Lactose Intolerance (Gi) Diarrhea    Prescriptions Prior to Admission  Medication Sig Dispense Refill Last Dose  . acetaminophen (TYLENOL) 500 MG tablet Take 1,000 mg by mouth every 6 (six) hours as needed for mild pain, moderate pain or headache.    04/02/2016 at Unknown time  . benzonatate (TESSALON) 100 MG capsule Take 1 capsule (100 mg total) by mouth every 8 (eight) hours. (Patient not taking: Reported on 03/08/2016) 21 capsule 0 Completed Course at Unknown time  . HYDROcodone-acetaminophen (NORCO/VICODIN) 5-325 MG tablet Take 1-2 tablets by mouth every 6 (six) hours  as needed for moderate pain. (Patient not taking: Reported on 03/08/2016) 12 tablet 0 Completed Course at Unknown time  . promethazine (PHENERGAN) 25 MG tablet Take 1 tablet (25 mg total) by mouth every 6 (six) hours as needed for nausea or vomiting. (Patient not taking: Reported on 03/08/2016) 30 tablet 0 Completed Course at Unknown time    Review of Systems  HENT: Negative for sore throat.   Genitourinary: Positive for pelvic pain, vaginal bleeding and vaginal discharge. Negative for flank pain and urgency.  Skin: Negative for rash.   Physical Exam   Blood pressure 124/81, pulse 84, temperature 99 F (37.2 C), temperature source Oral, resp. rate 18, height 5\' 3"  (1.6 m), weight 171 lb 12 oz (77.9 kg), last menstrual period  01/14/2016, SpO2 100 %.  Physical Exam  Constitutional: She is oriented to person, place, and time. She appears well-developed and well-nourished.  HENT:  Head: Normocephalic and atraumatic.  Cardiovascular: Normal rate and intact distal pulses.  Exam reveals no gallop and no friction rub.   No murmur heard. GI: Soft. Bowel sounds are normal. She exhibits no distension. There is tenderness. There is no rebound and no guarding.  Genitourinary:  Genitourinary Comments: Dried old blood in the vagina, thick greenish discharge, cervicitis present, significant cervical motion tenderness  Neurological: She is alert and oriented to person, place, and time.  Skin: Skin is warm and dry. No erythema.  Psychiatric: She has a normal mood and affect. Her behavior is normal.    MAU Course  Procedures  MDM In MAU patient underwent ultrasound examination given that her ultrasound proximal a month ago in the emergency department revealed a possible fetal pole but no cardiac activity and she now has bleeding. This ultrasound revealed monochorionic dye amniotic twin gestation at approximately 9 week and 1 days with a estimated date of delivery of July 8. Her EDD was updated in the computer.  She additionally underwent pelvic examination which revealed cervical motion tenderness and significant discharge. She was treated with Rocephin and azithromycin here in the MAU and given a prescription for another dose of azithromycin in 1 week.  She was given a prescription for her boyfriend for expedient and partner treatment.  Assessment and Plan  #1: Monochorionic diamniotic twin gestation message sent to [facility]-risk clinic to give patient established in prenatal care as she has seen [place] in this office before. Prenatal vitamin sent #2: Pelvic inflammatory disease wet prep negative GC and chlamydia pending. Will treat with Rocephin and azithromycin 1 g 20 week apart 3: Reflux patient given a prescription for  Zantac.  Jacquiline Doe 04/03/2016, 11:38 AM

## 2016-04-03 NOTE — Discharge Instructions (Signed)
Multiple Pregnancy Having a multiple pregnancy means that a woman is carrying more than one baby at a time. She may be pregnant with twins, triplets, or more. The majority of multiple pregnancies are twins. Naturally conceiving triplets or more (higher-order multiples) is rare. Multiple pregnancies are riskier than single pregnancies. A woman with a multiple pregnancy is more likely to have certain problems during her pregnancy. Therefore, she will need to have more frequent appointments for prenatal care. How does a multiple pregnancy happen? A multiple pregnancy happens when:  The woman's body releases more than one egg at a time, and then each egg gets fertilized by a different sperm.  This is the most common type of multiple pregnancy.  Twins or other multiples produced this way are fraternal. They are no more alike than non-multiple siblings are.  One sperm fertilizes one egg, which then divides into more than one embryo.  Twins or other multiples produced this way are identical. Identical multiples are always the same gender, and they look very much alike. Who is most likely to have a multiple pregnancy? A multiple pregnancy is more likely to develop in women who:  Have had fertility treatment, especially if the treatment included fertility drugs.  Are older than 29 years of age.  Have already had four or more children.  Have a family history of multiple pregnancy. How is a multiple pregnancy diagnosed? A multiple pregnancy may be diagnosed based on:  Symptoms such as:  Rapid weight gain in the first 3 months of pregnancy (first trimester).  More severe nausea and breast tenderness than what is typical of a single pregnancy.  The uterus measuring larger than what is normal for the stage of the pregnancy.  Blood tests that detect a higher-than-normal level of human chorionic gonadotropin (hCG). This is a hormone that your body produces in early pregnancy.  Ultrasound exam.  This is used to confirm that you are carrying multiples. What risks are associated with multiple pregnancy? A multiple pregnancy puts you at a higher risk for certain problems during or after your pregnancy, including:  Having your babies delivered before you have reached a full-term pregnancy (preterm birth). A full-term pregnancy lasts for at least 37 weeks. Babies born before 40 weeks may have a higher risk of a variety of health problems, such as breathing problems, feeding difficulties, cerebral palsy, and learning disabilities.  Diabetes.  Preeclampsia. This is a serious condition that causes high blood pressure along with other symptoms, such as swelling and headaches, during pregnancy.  Excessive blood loss after childbirth (postpartum hemorrhage).  Postpartum depression.  Low birth weight of the babies. How will having a multiple pregnancy affect my care? Your health care provider will want to monitor you more closely during your pregnancy to make sure that your babies are growing normally and that you are healthy. Follow these instructions at home: Because your pregnancy is considered to be high risk, you will need to work closely with your health care team. You may also need to make some lifestyle changes. These may include the following: Eating and drinking  Increase your nutrition.  Follow your health care providers recommendations for weight gain. You may need to gain a little extra weight when you are pregnant with multiples.  Eat healthy snacks often throughout the day. This can add calories and reduce nausea.  Drink enough fluid to keep your urine clear or pale yellow.  Take prenatal vitamins. Activity By 20-24 weeks, you may need to limit your  activities.  Avoid activities and work that take a lot of effort (are strenuous).  Ask your health care provider when you should stop having sexual intercourse.  Rest often. General instructions  Do not use any  products that contain nicotine or tobacco, such as cigarettes and e-cigarettes. If you need help quitting, ask your health care provider.  Do not drink alcohol or use illegal drugs.  Take over-the-counter and prescription medicines only as told by your health care provider.  Arrange for extra help around the house.  Keep all follow-up visits and all prenatal visits as told by your health care provider. This is important. Contact a health care provider if:  You have dizziness.  You have persistent nausea, vomiting, or diarrhea.  You are having trouble gaining weight.  You have feelings of depression or other emotions that are interfering with your normal activities. Get help right away if:  You have a fever.  You have pain with urination.  You have fluid leaking from your vagina.  You have a bad-smelling vaginal discharge.  You notice increased swelling in your face, hands, legs, or ankles.  You have spotting or bleeding from your vagina.  You have pelvic cramps, pelvic pressure, or nagging pain in your abdomen or lower back.  You are having regular contractions.  You develop a severe headache, with or without visual changes.  You have shortness of breath or chest pain.  You notice less fetal movement, or no fetal movement. This information is not intended to replace advice given to you by your health care provider. Make sure you discuss any questions you have with your health care provider. Document Released: 01/25/2008 Document Revised: 12/17/2015 Document Reviewed: 12/17/2015 Elsevier Interactive Patient Education  2017 [employer] Chlamydia, Female Chlamydia is an infection. It is spread from one person to another person during sexual contact. This infection can be in the cervix, urine tube (urethra), throat, or bottom (rectum). This infection needs treatment. HOME CARE   Take your medicines (antibiotics) as told. Finish them even if you start to feel  better.  Only take medicine as told by your doctor.  Tell your sex partner(s) that you have chlamydia. They must also be treated.  Do not have sex until your doctor says it is okay.  Rest.  Eat healthy. Drink enough fluids to keep your pee (urine) clear or pale yellow.  Keep all doctor visits as told. GET HELP IF:  You have pain when you pee.  You have belly pain.  You have vaginal discharge.  You have pain during sex.  You have bleeding between periods and after sex.  You have a fever. GET HELP RIGHT AWAY IF:   You feel sick to your stomach (nauseous) or you throw up (vomit).  You sweat much more than normal (diaphoresis).  You have trouble swallowing. This information is not intended to replace advice given to you by your health care provider. Make sure you discuss any questions you have with your health care provider. Document Released: 01/25/2008 Document Revised: 08/09/2015 Document Reviewed: 12/23/2012 Elsevier Interactive Patient Education  2017 [employer].

## 2016-04-04 LAB — GC/CHLAMYDIA PROBE AMP (~~LOC~~) NOT AT ARMC
Chlamydia: NEGATIVE
Neisseria Gonorrhea: NEGATIVE

## 2016-04-25 ENCOUNTER — Encounter: Payer: Self-pay | Admitting: Obstetrics & Gynecology

## 2016-04-25 ENCOUNTER — Ambulatory Visit (INDEPENDENT_AMBULATORY_CARE_PROVIDER_SITE_OTHER): Payer: Medicaid Other | Admitting: Obstetrics & Gynecology

## 2016-04-25 ENCOUNTER — Other Ambulatory Visit (HOSPITAL_COMMUNITY)
Admission: RE | Admit: 2016-04-25 | Discharge: 2016-04-25 | Disposition: A | Discharge status: Home / Self Care | Payer: Medicaid Other | Source: Ambulatory Visit | Attending: Obstetrics & Gynecology | Admitting: Obstetrics & Gynecology

## 2016-04-25 ENCOUNTER — Telehealth: Payer: Self-pay | Admitting: Clinical

## 2016-04-25 DIAGNOSIS — F329 Major depressive disorder, single episode, unspecified: Secondary | ICD-10-CM

## 2016-04-25 DIAGNOSIS — O30032 Twin pregnancy, monochorionic/diamniotic, second trimester: Secondary | ICD-10-CM

## 2016-04-25 DIAGNOSIS — F32A Depression, unspecified: Secondary | ICD-10-CM

## 2016-04-25 DIAGNOSIS — Z113 Encounter for screening for infections with a predominantly sexual mode of transmission: Secondary | ICD-10-CM | POA: Insufficient documentation

## 2016-04-25 DIAGNOSIS — O30033 Twin pregnancy, monochorionic/diamniotic, third trimester: Secondary | ICD-10-CM | POA: Insufficient documentation

## 2016-04-25 DIAGNOSIS — Z23 Encounter for immunization: Secondary | ICD-10-CM

## 2016-04-25 DIAGNOSIS — O30039 Twin pregnancy, monochorionic/diamniotic, unspecified trimester: Secondary | ICD-10-CM

## 2016-04-25 DIAGNOSIS — O0992 Supervision of high risk pregnancy, unspecified, second trimester: Secondary | ICD-10-CM

## 2016-04-25 DIAGNOSIS — O99342 Other mental disorders complicating pregnancy, second trimester: Secondary | ICD-10-CM

## 2016-04-25 NOTE — Telephone Encounter (Signed)
Attempt to call pt to set up appointment with [place]) at [facility], referred by Dr. Roselie Awkward. No voicemail, no message left.

## 2016-04-25 NOTE — Patient Instructions (Signed)
Multiple Pregnancy Having a multiple pregnancy means that a woman is carrying more than one baby at a time. She may be pregnant with twins, triplets, or more. The majority of multiple pregnancies are twins. Naturally conceiving triplets or more (higher-order multiples) is rare. Multiple pregnancies are riskier than single pregnancies. A woman with a multiple pregnancy is more likely to have certain problems during her pregnancy. Therefore, she will need to have more frequent appointments for prenatal care. How does a multiple pregnancy happen? A multiple pregnancy happens when:  The woman's body releases more than one egg at a time, and then each egg gets fertilized by a different sperm.  This is the most common type of multiple pregnancy.  Twins or other multiples produced this way are fraternal. They are no more alike than non-multiple siblings are.  One sperm fertilizes one egg, which then divides into more than one embryo.  Twins or other multiples produced this way are identical. Identical multiples are always the same gender, and they look very much alike. Who is most likely to have a multiple pregnancy? A multiple pregnancy is more likely to develop in women who:  Have had fertility treatment, especially if the treatment included fertility drugs.  Are older than 29 years of age.  Have already had four or more children.  Have a family history of multiple pregnancy. How is a multiple pregnancy diagnosed? A multiple pregnancy may be diagnosed based on:  Symptoms such as:  Rapid weight gain in the first 3 months of pregnancy (first trimester).  More severe nausea and breast tenderness than what is typical of a single pregnancy.  The uterus measuring larger than what is normal for the stage of the pregnancy.  Blood tests that detect a higher-than-normal level of human chorionic gonadotropin (hCG). This is a hormone that your body produces in early pregnancy.  Ultrasound exam.  This is used to confirm that you are carrying multiples. What risks are associated with multiple pregnancy? A multiple pregnancy puts you at a higher risk for certain problems during or after your pregnancy, including:  Having your babies delivered before you have reached a full-term pregnancy (preterm birth). A full-term pregnancy lasts for at least 37 weeks. Babies born before 37 weeks may have a higher risk of a variety of health problems, such as breathing problems, feeding difficulties, cerebral palsy, and learning disabilities.  Diabetes.  Preeclampsia. This is a serious condition that causes high blood pressure along with other symptoms, such as swelling and headaches, during pregnancy.  Excessive blood loss after childbirth (postpartum hemorrhage).  Postpartum depression.  Low birth weight of the babies. How will having a multiple pregnancy affect my care? Your health care provider will want to monitor you more closely during your pregnancy to make sure that your babies are growing normally and that you are healthy. Follow these instructions at home: Because your pregnancy is considered to be high risk, you will need to work closely with your health care team. You may also need to make some lifestyle changes. These may include the following: Eating and drinking   Increase your nutrition.  Follow your health care provider's recommendations for weight gain. You may need to gain a little extra weight when you are pregnant with multiples.  Eat healthy snacks often throughout the day. This can add calories and reduce nausea.  Drink enough fluid to keep your urine clear or pale yellow.  Take prenatal vitamins. Activity  By 20-24 weeks, you may need to   limit your activities.  Avoid activities and work that take a lot of effort (are strenuous).  Ask your health care provider when you should stop having sexual intercourse.  Rest often. General instructions   Do not use any  products that contain nicotine or tobacco, such as cigarettes and e-cigarettes. If you need help quitting, ask your health care provider.  Do not drink alcohol or use illegal drugs.  Take over-the-counter and prescription medicines only as told by your health care provider.  Arrange for extra help around the house.  Keep all follow-up visits and all prenatal visits as told by your health care provider. This is important. Contact a health care provider if:  You have dizziness.  You have persistent nausea, vomiting, or diarrhea.  You are having trouble gaining weight.  You have feelings of depression or other emotions that are interfering with your normal activities. Get help right away if:  You have a fever.  You have pain with urination.  You have fluid leaking from your vagina.  You have a bad-smelling vaginal discharge.  You notice increased swelling in your face, hands, legs, or ankles.  You have spotting or bleeding from your vagina.  You have pelvic cramps, pelvic pressure, or nagging pain in your abdomen or lower back.  You are having regular contractions.  You develop a severe headache, with or without visual changes.  You have shortness of breath or chest pain.  You notice less fetal movement, or no fetal movement. This information is not intended to replace advice given to you by your health care provider. Make sure you discuss any questions you have with your health care provider. Document Released: 01/25/2008 Document Revised: 12/17/2015 Document Reviewed: 12/17/2015 Elsevier Interactive Patient Education  2017 Elsevier Inc.  

## 2016-04-25 NOTE — Progress Notes (Signed)
  Subjective:    Robin Horn is a [id number] [redacted]w[redacted]d being seen today for her first obstetrical visit.  Her obstetrical history is significant for teins, h/o bipolar disorder. Patient does intend to breast feed. Pregnancy history fully reviewed.  Patient reports nausea, trouble sleeping, depression.  There were no vitals filed for this visit.  HISTORY: OB History  Gravida Para Term Preterm AB Living  8 4 4  0 3 4  SAB TAB Ectopic Multiple Live Births  0 3 0 0 4    # Outcome Date GA Lbr Len/2nd Weight Sex Delivery Anes PTL Lv  8 Current           7 Term 08/05/11 [redacted]w[redacted]d 06:45 / 01:35 6 lb 14.6 oz (3.135 kg) F Vag-Spont EPI  LIV     Birth Comments: birthmark on l leg  6 Term 2008        LIV  5 Term 2006        LIV  4 Term 2004        LIV  3 TAB           2 TAB           1 TAB              Past Medical History:  Diagnosis Date  . Abnormal Pap smear of cervix   . Anxiety   . Chlamydia   . Depression   . Gonorrhea   . Herpes genitalia   . Migraines   . Ovarian cyst   . Preeclampsia   . Pregnancy induced hypertension    Past Surgical History:  Procedure Laterality Date  . COLPOSCOPY    . DILATION AND CURETTAGE OF UTERUS    . TONSILLECTOMY     Family History  Problem Relation Age of Onset  . Kidney disease Mother   . Cancer Paternal Grandmother   . Other Neg Hx      Exam    Uterus:     Pelvic Exam:    Perineum: No Hemorrhoids   Vulva: normal   Vagina:  normal mucosa   pH:     Cervix: no lesions   Adnexa: normal adnexa   Bony Pelvis: average  System: Breast:  normal appearance, no masses or tenderness   Skin: normal coloration and turgor, no rashes    Neurologic: oriented, normal mood   Extremities: normal strength, tone, and muscle mass   HEENT thyroid without masses   Mouth/Teeth mucous membranes moist, pharynx normal without lesions and dental hygiene good   Neck supple   Cardiovascular: regular rate and rhythm   Respiratory:  appears well, vitals  normal, no respiratory distress, acyanotic, normal RR, neck free of mass or lymphadenopathy   Abdomen: soft, non-tender; bowel sounds normal; no masses,  no organomegaly   Urinary: urethral meatus normal      Assessment:    Pregnancy: [id number] Patient Active Problem List   Diagnosis Date Noted  . Gonorrhea         Plan:     Initial labs drawn. Prenatal vitamins. Problem list reviewed and updated. Genetic Screening discussed First Screen: requested.  Ultrasound discussed; fetal survey: 18-20 weeks.  Follow up in 4 weeks. 50% of 30 min visit spent on counseling and coordination of care.  To see Vesta Mixer, needs to return for psychiatric care   Emeterio Reeve 04/25/2016

## 2016-04-26 ENCOUNTER — Telehealth: Payer: Self-pay | Admitting: General Practice

## 2016-04-26 LAB — POCT URINALYSIS DIP (DEVICE)
BILIRUBIN URINE: NEGATIVE
GLUCOSE, UA: NEGATIVE mg/dL
Hgb urine dipstick: NEGATIVE
Ketones, ur: NEGATIVE mg/dL
LEUKOCYTES UA: NEGATIVE
NITRITE: NEGATIVE
PH: 5.5 (ref 5.0–8.0)
Protein, ur: NEGATIVE mg/dL
Specific Gravity, Urine: <=1.005 (ref 1.005–1.030)
Urobilinogen, UA: 0.2 mg/dL (ref 0.0–1.0)

## 2016-04-26 LAB — PRENATAL PROFILE (SOLSTAS)
ANTIBODY SCREEN: NEGATIVE
BASOS PCT: 0 %
Basophils Absolute: 0 cells/uL (ref 0–200)
EOS ABS: 174 {cells}/uL (ref 15–500)
Eosinophils Relative: 2 %
HEMATOCRIT: 39.3 % (ref 35.0–45.0)
HIV: NONREACTIVE
Hemoglobin: 12.8 g/dL (ref 11.7–15.5)
Hepatitis B Surface Ag: NEGATIVE
Lymphocytes Relative: 24 %
Lymphs Abs: 2088 cells/uL (ref 850–3900)
MCH: 28.4 pg (ref 27.0–33.0)
MCHC: 32.6 g/dL (ref 32.0–36.0)
MCV: 87.3 fL (ref 80.0–100.0)
MONO ABS: 783 {cells}/uL (ref 200–950)
MONOS PCT: 9 %
MPV: 10.3 fL (ref 7.5–12.5)
NEUTROS ABS: 5655 {cells}/uL (ref 1500–7800)
Neutrophils Relative %: 65 %
PLATELETS: 263 10*3/uL (ref 140–400)
RBC: 4.5 MIL/uL (ref 3.80–5.10)
RDW: 14.8 % (ref 11.0–15.0)
RH TYPE: POSITIVE
Rubella: 0.97 Index — ABNORMAL HIGH (ref <0.90)
WBC: 8.7 10*3/uL (ref 3.8–10.8)

## 2016-04-26 LAB — CULTURE, OB URINE: ORGANISM ID, BACTERIA: NO GROWTH

## 2016-04-26 LAB — GC/CHLAMYDIA PROBE AMP (~~LOC~~) NOT AT ARMC
Chlamydia: NEGATIVE
NEISSERIA GONORRHEA: NEGATIVE

## 2016-04-26 NOTE — Telephone Encounter (Signed)
Patient called into front office reporting severe headaches that is not helped by tylenol, dizziness, & blurry vision with pressure between her legs. Told patient she should go to [facility] for evaluation. Patient verbalized understanding and had no questions

## 2016-04-27 LAB — HEMOGLOBINOPATHY EVALUATION
HCT: 39.3 % (ref 35.0–45.0)
HEMOGLOBIN: 12.8 g/dL (ref 11.7–15.5)
HGB A2 QUANT: 2.2 % (ref 1.8–3.5)
Hgb A: 96.8 % (ref >96.0)
MCH: 28.4 pg (ref 27.0–33.0)
MCV: 87.3 fL (ref 80.0–100.0)
RBC: 4.5 MIL/uL (ref 3.80–5.10)
RDW: 14.8 % (ref 11.0–15.0)

## 2016-04-27 LAB — PAIN MGMT, PROFILE 6 CONF W/O MM, U
6 Acetylmorphine: NEGATIVE ng/mL (ref <10)
ALCOHOL METABOLITES: NEGATIVE ng/mL (ref <500)
Amphetamines: NEGATIVE ng/mL (ref <500)
BENZOYLECGONINE: 875 ng/mL — AB (ref <100)
Barbiturates: NEGATIVE ng/mL (ref <300)
Benzodiazepines: NEGATIVE ng/mL (ref <100)
COCAINE METABOLITE: POSITIVE ng/mL — AB (ref <150)
Creatinine: 63.5 mg/dL (ref >=20.0)
MARIJUANA METABOLITE: NEGATIVE ng/mL (ref <20)
Methadone Metabolite: NEGATIVE ng/mL (ref <100)
OXIDANT: NEGATIVE ug/mL (ref <200)
OXYCODONE: NEGATIVE ng/mL (ref <100)
Opiates: NEGATIVE ng/mL (ref <100)
PHENCYCLIDINE: NEGATIVE ng/mL (ref <25)
PLEASE NOTE: 0
pH: 5.79 (ref 4.5–9.0)

## 2016-05-01 DIAGNOSIS — O24419 Gestational diabetes mellitus in pregnancy, unspecified control: Secondary | ICD-10-CM

## 2016-05-01 HISTORY — DX: Gestational diabetes mellitus in pregnancy, unspecified control: O24.419

## 2016-05-01 NOTE — L&D Delivery Note (Signed)
  Robin Horn, Robin Horn [[id number]]  Delivery Note At 1:32 PM a viable female was delivered via Vaginal, Spontaneous Delivery (Presentation:vertex ; LOA ).  APGAR:8 ,9 ; weight  .   Placenta status:spont , shultz.  Cord:3vc  with the following complications:none .  Cord pH: n/a  Anesthesia: epidural  Episiotomy:  none Lacerations:  none Suture Repair: n/a Est. Blood Loss 100 (mL):    Mom to postpartum.  Baby to NICU  [name] 09/24/2016, 1:45 PM     Robin Horn, Robin Horn [[id number]]  Delivery Note At 1:34 PM a viable female was delivered via Vaginal, Spontaneous Delivery (Presentation:vertex ; LOA ).  APGAR:8 9, ; weight  .   Placenta status:spont ,shultz .  Cord:3vc  with the following complications:none .  Cord pH: n/a  Anesthesia:  epidural Episiotomy:none   Lacerations:  none Suture Repair: n/a Est. Blood Loss 100 (mL):    Mom to postpartum.  Baby to NICU.  [name] 09/24/2016, 1:45 PM

## 2016-05-03 ENCOUNTER — Other Ambulatory Visit: Payer: Self-pay | Admitting: Obstetrics & Gynecology

## 2016-05-03 ENCOUNTER — Encounter (HOSPITAL_COMMUNITY): Payer: Self-pay

## 2016-05-03 ENCOUNTER — Ambulatory Visit (HOSPITAL_COMMUNITY)
Admission: RE | Admit: 2016-05-03 | Discharge: 2016-05-03 | Disposition: A | Discharge status: Home / Self Care | Payer: Medicaid Other | Source: Ambulatory Visit | Attending: Obstetrics & Gynecology | Admitting: Obstetrics & Gynecology

## 2016-05-03 DIAGNOSIS — O10011 Pre-existing essential hypertension complicating pregnancy, first trimester: Secondary | ICD-10-CM | POA: Insufficient documentation

## 2016-05-03 DIAGNOSIS — O99331 Smoking (tobacco) complicating pregnancy, first trimester: Secondary | ICD-10-CM | POA: Diagnosis not present

## 2016-05-03 DIAGNOSIS — O30031 Twin pregnancy, monochorionic/diamniotic, first trimester: Secondary | ICD-10-CM | POA: Diagnosis not present

## 2016-05-03 DIAGNOSIS — O09291 Supervision of pregnancy with other poor reproductive or obstetric history, first trimester: Secondary | ICD-10-CM | POA: Insufficient documentation

## 2016-05-03 DIAGNOSIS — Z3A13 13 weeks gestation of pregnancy: Secondary | ICD-10-CM | POA: Diagnosis not present

## 2016-05-03 DIAGNOSIS — O0992 Supervision of high risk pregnancy, unspecified, second trimester: Secondary | ICD-10-CM

## 2016-05-03 DIAGNOSIS — Z3682 Encounter for antenatal screening for nuchal translucency: Secondary | ICD-10-CM | POA: Insufficient documentation

## 2016-05-04 ENCOUNTER — Other Ambulatory Visit (HOSPITAL_COMMUNITY): Payer: Self-pay | Admitting: *Deleted

## 2016-05-04 DIAGNOSIS — O30032 Twin pregnancy, monochorionic/diamniotic, second trimester: Secondary | ICD-10-CM

## 2016-05-09 ENCOUNTER — Telehealth: Payer: Self-pay

## 2016-05-09 DIAGNOSIS — B379 Candidiasis, unspecified: Secondary | ICD-10-CM

## 2016-05-09 NOTE — Telephone Encounter (Signed)
Pt called and stated that she is itching, burning, with discharge and requests Rx until she can be seen.

## 2016-05-10 MED ORDER — TERCONAZOLE 0.4 % VA CREA: 1.0000 | TOPICAL_CREAM | Freq: Every day | VAGINAL | 0 refills | Status: DC

## 2016-05-24 ENCOUNTER — Encounter (HOSPITAL_COMMUNITY): Payer: Self-pay

## 2016-05-24 ENCOUNTER — Ambulatory Visit (HOSPITAL_COMMUNITY)
Admission: RE | Admit: 2016-05-24 | Discharge: 2016-05-24 | Disposition: A | Discharge status: Home / Self Care | Payer: Medicaid Other | Source: Ambulatory Visit | Attending: Obstetrics & Gynecology | Admitting: Obstetrics & Gynecology

## 2016-05-24 DIAGNOSIS — O10012 Pre-existing essential hypertension complicating pregnancy, second trimester: Secondary | ICD-10-CM | POA: Insufficient documentation

## 2016-05-24 DIAGNOSIS — O30032 Twin pregnancy, monochorionic/diamniotic, second trimester: Secondary | ICD-10-CM | POA: Insufficient documentation

## 2016-05-24 DIAGNOSIS — O99332 Smoking (tobacco) complicating pregnancy, second trimester: Secondary | ICD-10-CM | POA: Diagnosis not present

## 2016-05-24 DIAGNOSIS — O99322 Drug use complicating pregnancy, second trimester: Secondary | ICD-10-CM | POA: Insufficient documentation

## 2016-05-24 DIAGNOSIS — O09292 Supervision of pregnancy with other poor reproductive or obstetric history, second trimester: Secondary | ICD-10-CM | POA: Diagnosis not present

## 2016-05-24 DIAGNOSIS — Z3A16 16 weeks gestation of pregnancy: Secondary | ICD-10-CM | POA: Insufficient documentation

## 2016-05-25 ENCOUNTER — Ambulatory Visit (INDEPENDENT_AMBULATORY_CARE_PROVIDER_SITE_OTHER): Payer: Self-pay | Admitting: Clinical

## 2016-05-25 ENCOUNTER — Encounter: Payer: Self-pay | Admitting: Family Medicine

## 2016-05-25 ENCOUNTER — Other Ambulatory Visit (HOSPITAL_COMMUNITY): Payer: Self-pay | Admitting: *Deleted

## 2016-05-25 ENCOUNTER — Ambulatory Visit (INDEPENDENT_AMBULATORY_CARE_PROVIDER_SITE_OTHER): Payer: Medicaid Other | Admitting: Family Medicine

## 2016-05-25 ENCOUNTER — Encounter: Payer: Self-pay | Admitting: Obstetrics & Gynecology

## 2016-05-25 VITALS — BP 130/80 | HR 84 | Wt 178.4 lb

## 2016-05-25 DIAGNOSIS — F319 Bipolar disorder, unspecified: Secondary | ICD-10-CM | POA: Insufficient documentation

## 2016-05-25 DIAGNOSIS — O09299 Supervision of pregnancy with other poor reproductive or obstetric history, unspecified trimester: Secondary | ICD-10-CM

## 2016-05-25 DIAGNOSIS — O99320 Drug use complicating pregnancy, unspecified trimester: Secondary | ICD-10-CM | POA: Insufficient documentation

## 2016-05-25 DIAGNOSIS — F1990 Other psychoactive substance use, unspecified, uncomplicated: Secondary | ICD-10-CM | POA: Diagnosis not present

## 2016-05-25 DIAGNOSIS — O99342 Other mental disorders complicating pregnancy, second trimester: Secondary | ICD-10-CM | POA: Diagnosis not present

## 2016-05-25 DIAGNOSIS — F3162 Bipolar disorder, current episode mixed, moderate: Secondary | ICD-10-CM

## 2016-05-25 DIAGNOSIS — O30032 Twin pregnancy, monochorionic/diamniotic, second trimester: Secondary | ICD-10-CM

## 2016-05-25 DIAGNOSIS — G4701 Insomnia due to medical condition: Secondary | ICD-10-CM

## 2016-05-25 DIAGNOSIS — G4700 Insomnia, unspecified: Secondary | ICD-10-CM | POA: Insufficient documentation

## 2016-05-25 DIAGNOSIS — O30039 Twin pregnancy, monochorionic/diamniotic, unspecified trimester: Secondary | ICD-10-CM

## 2016-05-25 DIAGNOSIS — O99322 Drug use complicating pregnancy, second trimester: Secondary | ICD-10-CM | POA: Diagnosis not present

## 2016-05-25 DIAGNOSIS — O0992 Supervision of high risk pregnancy, unspecified, second trimester: Secondary | ICD-10-CM

## 2016-05-25 HISTORY — DX: Supervision of pregnancy with other poor reproductive or obstetric history, unspecified trimester: O09.299

## 2016-05-25 HISTORY — DX: Drug use complicating pregnancy, unspecified trimester: O99.320

## 2016-05-25 MED ORDER — ZOLPIDEM TARTRATE 5 MG PO TABS: 5.0000 mg | ORAL_TABLET | Freq: Every evening | ORAL | 0 refills | Status: DC | PRN

## 2016-05-25 NOTE — Patient Instructions (Signed)
Second Trimester of Pregnancy The second trimester is from week 13 through week 28 (months 4 through 6). The second trimester is often a time when you feel your best. Your body has also adjusted to being pregnant, and you begin to feel better physically. Usually, morning sickness has lessened or quit completely, you may have more energy, and you may have an increase in appetite. The second trimester is also a time when the fetus is growing rapidly. At the end of the sixth month, the fetus is about 9 inches long and weighs about 1 pounds. You will likely begin to feel the baby move (quickening) between 18 and 20 weeks of the pregnancy. Body changes during your second trimester Your body continues to go through many changes during your second trimester. The changes vary from woman to woman.  Your weight will continue to increase. You will notice your lower abdomen bulging out.  You may begin to get stretch marks on your hips, abdomen, and breasts.  You may develop headaches that can be relieved by medicines. The medicines should be approved by your health care provider.  You may urinate more often because the fetus is pressing on your bladder.  You may develop or continue to have heartburn as a result of your pregnancy.  You may develop constipation because certain hormones are causing the muscles that push waste through your intestines to slow down.  You may develop hemorrhoids or swollen, bulging veins (varicose veins).  You may have back pain. This is caused by:  Weight gain.  Pregnancy hormones that are relaxing the joints in your pelvis.  A shift in weight and the muscles that support your balance.  Your breasts will continue to grow and they will continue to become tender.  Your gums may bleed and may be sensitive to brushing and flossing.  Dark spots or blotches (chloasma, mask of pregnancy) may develop on your face. This will likely fade after the baby is born.  A dark line  from your belly button to the pubic area (linea nigra) may appear. This will likely fade after the baby is born.  You may have changes in your hair. These can include thickening of your hair, rapid growth, and changes in texture. Some women also have hair loss during or after pregnancy, or hair that feels dry or thin. Your hair will most likely return to normal after your baby is born. What to expect at prenatal visits During a routine prenatal visit:  You will be weighed to make sure you and the fetus are growing normally.  Your blood pressure will be taken.  Your abdomen will be measured to track your baby's growth.  The fetal heartbeat will be listened to.  Any test results from the previous visit will be discussed. Your health care provider may ask you:  How you are feeling.  If you are feeling the baby move.  If you have had any abnormal symptoms, such as leaking fluid, bleeding, severe headaches, or abdominal cramping.  If you are using any tobacco products, including cigarettes, chewing tobacco, and electronic cigarettes.  If you have any questions. Other tests that may be performed during your second trimester include:  Blood tests that check for:  Low iron levels (anemia).  Gestational diabetes (between 24 and 28 weeks).  Rh antibodies. This is to check for a protein on red blood cells (Rh factor).  Urine tests to check for infections, diabetes, or protein in the urine.  An ultrasound to  confirm the proper growth and development of the baby.  An amniocentesis to check for possible genetic problems.  Fetal screens for spina bifida and Down syndrome.  HIV (human immunodeficiency virus) testing. Routine prenatal testing includes screening for HIV, unless you choose not to have this test. Follow these instructions at home: Eating and drinking  Continue to eat regular, healthy meals.  Avoid raw meat, uncooked cheese, cat litter boxes, and soil used by cats. These  carry germs that can cause birth defects in the baby.  Take your prenatal vitamins.  Take 1500-2000 mg of calcium daily starting at the 20th week of pregnancy until you deliver your baby.  If you develop constipation:  Take over-the-counter or prescription medicines.  Drink enough fluid to keep your urine clear or pale yellow.  Eat foods that are high in fiber, such as fresh fruits and vegetables, whole grains, and beans.  Limit foods that are high in fat and processed sugars, such as fried and sweet foods. Activity  Exercise only as directed by your health care provider. Experiencing uterine cramps is a good sign to stop exercising.  Avoid heavy lifting, wear low heel shoes, and practice good posture.  Wear your seat belt at all times when driving.  Rest with your legs elevated if you have leg cramps or low back pain.  Wear a good support bra for breast tenderness.  Do not use hot tubs, steam rooms, or saunas. Lifestyle  Avoid all smoking, herbs, alcohol, and unprescribed drugs. These chemicals affect the formation and growth of the baby.  Do not use any products that contain nicotine or tobacco, such as cigarettes and e-cigarettes. If you need help quitting, ask your health care provider.  A sexual relationship may be continued unless your health care provider directs you otherwise. General instructions  Follow your health care provider's instructions regarding medicine use. There are medicines that are either safe or unsafe to take during pregnancy.  Take warm sitz baths to soothe any pain or discomfort caused by hemorrhoids. Use hemorrhoid cream if your health care provider approves.  If you develop varicose veins, wear support hose. Elevate your feet for 15 minutes, 3-4 times a day. Limit salt in your diet.  Visit your dentist if you have not gone yet during your pregnancy. Use a soft toothbrush to brush your teeth and be gentle when you floss.  Keep all follow-up  prenatal visits as told by your health care provider. This is important. Contact a health care provider if:  You have dizziness.  You have mild pelvic cramps, pelvic pressure, or nagging pain in the abdominal area.  You have persistent nausea, vomiting, or diarrhea.  You have a bad smelling vaginal discharge.  You have pain with urination. Get help right away if:  You have a fever.  You are leaking fluid from your vagina.  You have spotting or bleeding from your vagina.  You have severe abdominal cramping or pain.  You have rapid weight gain or weight loss.  You have shortness of breath with chest pain.  You notice sudden or extreme swelling of your face, hands, ankles, feet, or legs.  You have not felt your baby move in over an hour.  You have severe headaches that do not go away with medicine.  You have vision changes. Summary  The second trimester is from week 13 through week 28 (months 4 through 6). It is also a time when the fetus is growing rapidly.  Your body goes  through many changes during pregnancy. The changes vary from woman to woman.  Avoid all smoking, herbs, alcohol, and unprescribed drugs. These chemicals affect the formation and growth your baby.  Do not use any tobacco products, such as cigarettes, chewing tobacco, and e-cigarettes. If you need help quitting, ask your health care provider.  Contact your health care provider if you have any questions. Keep all prenatal visits as told by your health care provider. This is important. This information is not intended to replace advice given to you by your health care provider. Make sure you discuss any questions you have with your health care provider. Document Released: 04/11/2001 Document Revised: 09/23/2015 Document Reviewed: 06/18/2012 Elsevier Interactive Patient Education  2017 [employer].   Breastfeeding Deciding to breastfeed is one of the best choices you can make for you and your baby. A  change in hormones during pregnancy causes your breast tissue to grow and increases the number and size of your milk ducts. These hormones also allow proteins, sugars, and fats from your blood supply to make breast milk in your milk-producing glands. Hormones prevent breast milk from being released before your baby is born as well as prompt milk flow after birth. Once breastfeeding has begun, thoughts of your baby, as well as his or her sucking or crying, can stimulate the release of milk from your milk-producing glands. Benefits of breastfeeding For Your Baby  Your first milk (colostrum) helps your baby's digestive system function better.  There are antibodies in your milk that help your baby fight off infections.  Your baby has a lower incidence of asthma, allergies, and sudden infant death syndrome.  The nutrients in breast milk are better for your baby than infant formulas and are designed uniquely for your baby's needs.  Breast milk improves your baby's brain development.  Your baby is less likely to develop other conditions, such as childhood obesity, asthma, or type 2 diabetes mellitus. For You  Breastfeeding helps to create a very special bond between you and your baby.  Breastfeeding is convenient. Breast milk is always available at the correct temperature and costs nothing.  Breastfeeding helps to burn calories and helps you lose the weight gained during pregnancy.  Breastfeeding makes your uterus contract to its prepregnancy size faster and slows bleeding (lochia) after you give birth.  Breastfeeding helps to lower your risk of developing type 2 diabetes mellitus, osteoporosis, and breast or ovarian cancer later in life. Signs that your baby is hungry Early Signs of Hunger  Increased alertness or activity.  Stretching.  Movement of the head from side to side.  Movement of the head and opening of the mouth when the corner of the mouth or cheek is stroked  (rooting).  Increased sucking sounds, smacking lips, cooing, sighing, or squeaking.  Hand-to-mouth movements.  Increased sucking of fingers or hands. Late Signs of Hunger  Fussing.  Intermittent crying. Extreme Signs of Hunger  Signs of extreme hunger will require calming and consoling before your baby will be able to breastfeed successfully. Do not wait for the following signs of extreme hunger to occur before you initiate breastfeeding:  Restlessness.  A loud, strong cry.  Screaming. Breastfeeding basics  Breastfeeding Initiation  Find a comfortable place to sit or lie down, with your neck and back well supported.  Place a pillow or rolled up blanket under your baby to bring him or her to the level of your breast (if you are seated). Nursing pillows are specially designed to help  support your arms and your baby while you breastfeed.  Make sure that your baby's abdomen is facing your abdomen.  Gently massage your breast. With your fingertips, massage from your chest wall toward your nipple in a circular motion. This encourages milk flow. You may need to continue this action during the feeding if your milk flows slowly.  Support your breast with 4 fingers underneath and your thumb above your nipple. Make sure your fingers are well away from your nipple and your baby's mouth.  Stroke your baby's lips gently with your finger or nipple.  When your baby's mouth is open wide enough, quickly bring your baby to your breast, placing your entire nipple and as much of the colored area around your nipple (areola) as possible into your baby's mouth.  More areola should be visible above your baby's upper lip than below the lower lip.  Your baby's tongue should be between his or her lower gum and your breast.  Ensure that your baby's mouth is correctly positioned around your nipple (latched). Your baby's lips should create a seal on your breast and be turned out (everted).  It is common  for your baby to suck about 2-3 minutes in order to start the flow of breast milk. Latching  Teaching your baby how to latch on to your breast properly is very important. An improper latch can cause nipple pain and decreased milk supply for you and poor weight gain in your baby. Also, if your baby is not latched onto your nipple properly, he or she may swallow some air during feeding. This can make your baby fussy. Burping your baby when you switch breasts during the feeding can help to get rid of the air. However, teaching your baby to latch on properly is still the best way to prevent fussiness from swallowing air while breastfeeding. Signs that your baby has successfully latched on to your nipple:  Silent tugging or silent sucking, without causing you pain.  Swallowing heard between every 3-4 sucks.  Muscle movement above and in front of his or her ears while sucking. Signs that your baby has not successfully latched on to nipple:  Sucking sounds or smacking sounds from your baby while breastfeeding.  Nipple pain. If you think your baby has not latched on correctly, slip your finger into the corner of your baby's mouth to break the suction and place it between your baby's gums. Attempt breastfeeding initiation again. Signs of Successful Breastfeeding  Signs from your baby:  A gradual decrease in the number of sucks or complete cessation of sucking.  Falling asleep.  Relaxation of his or her body.  Retention of a small amount of milk in his or her mouth.  Letting go of your breast by himself or herself. Signs from you:  Breasts that have increased in firmness, weight, and size 1-3 hours after feeding.  Breasts that are softer immediately after breastfeeding.  Increased milk volume, as well as a change in milk consistency and color by the fifth day of breastfeeding.  Nipples that are not sore, cracked, or bleeding. Signs That Your [employer] is Getting Enough Milk  Wetting at least  1-2 diapers during the first 24 hours after birth.  Wetting at least 5-6 diapers every 24 hours for the first week after birth. The urine should be clear or pale yellow by 5 days after birth.  Wetting 6-8 diapers every 24 hours as your baby continues to grow and develop.  At least 3 stools in  a 24-hour period by age 17 days. The stool should be soft and yellow.  At least 3 stools in a 24-hour period by age 50 days. The stool should be seedy and yellow.  No loss of weight greater than 10% of birth weight during the first 57 days of age.  Average weight gain of 4-7 ounces (113-198 g) per week after age 47 days.  Consistent daily weight gain by age 96 days, without weight loss after the age of 2 weeks. After a feeding, your baby may spit up a small amount. This is common. Breastfeeding frequency and duration Frequent feeding will help you make more milk and can prevent sore nipples and breast engorgement. Breastfeed when you feel the need to reduce the fullness of your breasts or when your baby shows signs of hunger. This is called "breastfeeding on demand." Avoid introducing a pacifier to your baby while you are working to establish breastfeeding (the first 4-6 weeks after your baby is born). After this time you may choose to use a pacifier. Research has shown that pacifier use during the first year of a baby's life decreases the risk of sudden infant death syndrome (SIDS). Allow your baby to feed on each breast as long as he or she wants. Breastfeed until your baby is finished feeding. When your baby unlatches or falls asleep while feeding from the first breast, offer the second breast. Because newborns are often sleepy in the first few weeks of life, you may need to awaken your baby to get him or her to feed. Breastfeeding times will vary from baby to baby. However, the following rules can serve as a guide to help you ensure that your baby is properly fed:  Newborns (babies 59 weeks of age or younger)  may breastfeed every 1-3 hours.  Newborns should not go longer than 3 hours during the day or 5 hours during the night without breastfeeding.  You should breastfeed your baby a minimum of 8 times in a 24-hour period until you begin to introduce solid foods to your baby at around 36 months of age. Breast milk pumping Pumping and storing breast milk allows you to ensure that your baby is exclusively fed your breast milk, even at times when you are unable to breastfeed. This is especially important if you are going back to work while you are still breastfeeding or when you are not able to be present during feedings. Your lactation consultant can give you guidelines on how long it is safe to store breast milk. A breast pump is a machine that allows you to pump milk from your breast into a sterile bottle. The pumped breast milk can then be stored in a refrigerator or freezer. Some breast pumps are operated by hand, while others use electricity. Ask your lactation consultant which type will work best for you. Breast pumps can be purchased, but some hospitals and breastfeeding support groups lease breast pumps on a monthly basis. A lactation consultant can teach you how to hand express breast milk, if you prefer not to use a pump. Caring for your breasts while you breastfeed Nipples can become dry, cracked, and sore while breastfeeding. The following recommendations can help keep your breasts moisturized and healthy:  Avoid using soap on your nipples.  Wear a supportive bra. Although not required, special nursing bras and tank tops are designed to allow access to your breasts for breastfeeding without taking off your entire bra or top. Avoid wearing underwire-style bras or extremely tight  bras.  Air dry your nipples for 3-30minutes after each feeding.  Use only cotton bra pads to absorb leaked breast milk. Leaking of breast milk between feedings is normal.  Use lanolin on your nipples after breastfeeding.  Lanolin helps to maintain your skin's normal moisture barrier. If you use pure lanolin, you do not need to wash it off before feeding your baby again. Pure lanolin is not toxic to your baby. You may also hand express a few drops of breast milk and gently massage that milk into your nipples and allow the milk to air dry. In the first few weeks after giving birth, some women experience extremely full breasts (engorgement). Engorgement can make your breasts feel heavy, warm, and tender to the touch. Engorgement peaks within 3-5 days after you give birth. The following recommendations can help ease engorgement:  Completely empty your breasts while breastfeeding or pumping. You may want to start by applying warm, moist heat (in the shower or with warm water-soaked hand towels) just before feeding or pumping. This increases circulation and helps the milk flow. If your baby does not completely empty your breasts while breastfeeding, pump any extra milk after he or she is finished.  Wear a snug bra (nursing or regular) or tank top for 1-2 days to signal your body to slightly decrease milk production.  Apply ice packs to your breasts, unless this is too uncomfortable for you.  Make sure that your baby is latched on and positioned properly while breastfeeding. If engorgement persists after 48 hours of following these recommendations, contact your health care provider or a Science writer. Overall health care recommendations while breastfeeding  Eat healthy foods. Alternate between meals and snacks, eating 3 of each per day. Because what you eat affects your breast milk, some of the foods may make your baby more irritable than usual. Avoid eating these foods if you are sure that they are negatively affecting your baby.  Drink milk, fruit juice, and water to satisfy your thirst (about 10 glasses a day).  Rest often, relax, and continue to take your prenatal vitamins to prevent fatigue, stress, and  anemia.  Continue breast self-awareness checks.  Avoid chewing and smoking tobacco. Chemicals from cigarettes that pass into breast milk and exposure to secondhand smoke may harm your baby.  Avoid alcohol and drug use, including marijuana. Some medicines that may be harmful to your baby can pass through breast milk. It is important to ask your health care provider before taking any medicine, including all over-the-counter and prescription medicine as well as vitamin and herbal supplements. It is possible to become pregnant while breastfeeding. If birth control is desired, ask your health care provider about options that will be safe for your baby. Contact a health care provider if:  You feel like you want to stop breastfeeding or have become frustrated with breastfeeding.  You have painful breasts or nipples.  Your nipples are cracked or bleeding.  Your breasts are red, tender, or warm.  You have a swollen area on either breast.  You have a fever or chills.  You have nausea or vomiting.  You have drainage other than breast milk from your nipples.  Your breasts do not become full before feedings by the fifth day after you give birth.  You feel sad and depressed.  Your baby is too sleepy to eat well.  Your baby is having trouble sleeping.  Your baby is wetting less than 3 diapers in a 24-hour period.  Your baby  has less than 3 stools in a 24-hour period.  Your baby's skin or the white part of his or her eyes becomes yellow.  Your baby is not gaining weight by 29 days of age. Get help right away if:  Your baby is overly tired (lethargic) and does not want to wake up and feed.  Your baby develops an unexplained fever. This information is not intended to replace advice given to you by your health care provider. Make sure you discuss any questions you have with your health care provider. Document Released: 04/17/2005 Document Revised: 09/29/2015 Document Reviewed:  10/09/2012 Elsevier Interactive Patient Education  2017 [employer].

## 2016-05-25 NOTE — Progress Notes (Signed)
   PRENATAL VISIT NOTE  Subjective:  Robin Horn is a 31 y.o. [id number] at [redacted]w[redacted]d being seen today for ongoing prenatal care.  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  Patient reports backache and insomnia.  Contractions: Not present. Vag. Bleeding: None.  Movement: Present. Denies leaking of fluid.   The following portions of the patient's history were reviewed and updated as appropriate: allergies, current medications, past family history, past medical history, past social history, past surgical history and problem list. Problem list updated.  Objective:   Vitals:   05/25/16 1304  BP: 130/80  Pulse: 84  Weight: 178 lb 6.4 oz (80.9 kg)    Fetal Status: Fetal Heart Rate (bpm): 158/160 Fundal Height: 22 cm Movement: Present     General:  Alert, oriented and cooperative. Patient is in no acute distress.  Skin: Skin is warm and dry. No rash noted.   Cardiovascular: Normal heart rate noted  Respiratory: Normal respiratory effort, no problems with respiration noted  Abdomen: Soft, gravid, appropriate for gestational age. Pain/Pressure: Present     Pelvic:  Cervical exam deferred        Extremities: Normal range of motion.  Edema: Trace  Mental Status: Normal mood and affect. Normal behavior. Normal judgment and thought content.   Assessment and Plan:  Pregnancy: [name] at [redacted]w[redacted]d  1. Monochorionic diamniotic twin pregnancy, antepartum A<B fluid--for weekly checks to see about TTTS  2. Supervision of high risk pregnancy in second trimester Continue prenatal care.   3. Drug use during pregnancy in second trimester   4. Bipolar 1 disorder ([place]) Has true mania--on Seroquel in the past--to see Roselyn Reef - Ambulatory referral to  [facility]  5. Insomnia due to medical condition Trial of Ambien 2.5-5-->risks reviewed. - zolpidem (AMBIEN) 5 MG tablet; Take 1 tablet (5 mg total) by mouth at bedtime as needed for sleep.  Dispense: 30 tablet; Refill: 0  Preterm labor symptoms and general obstetric precautions including but not limited to vaginal bleeding, contractions, leaking of fluid and fetal movement were reviewed in detail with the patient. Please refer to After Visit Summary for other counseling recommendations.  Return in 4 weeks (on 06/22/2016).   [name], MD

## 2016-05-25 NOTE — BH Specialist Note (Signed)
Session Start time: 1:47   End Time: 2:07 Total Time:  20 minutes Type of Service: Behavioral Health - Individual/Family Interpreter: No.   Interpreter Name & Language: n/a # [facility] Visits July 2017-June 2018: 1st  SUBJECTIVE: Robin Horn is a 31 y.o. female  Pt. was referred by Dr Kennon Rounds for:  bipolar disorder. Pt. 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  Duration of problem:  Over two years Severity: severe Previous treatment: Yes, [facility], does not want to go back, but open to other options  OBJECTIVE: Mood: Appropriate & Affect: Appropriate Risk of harm to self or others: No known risk of harm to self or others, no SI, no HI Assessments administered: PHQ9: 15/ GAD7: 19  LIFE CONTEXT:  Family & Social: 4 children  School/ Work: - Self-Care: - Life changes: Current pregancy, twins What is important to pt/family (values): Healthy babies  GOALS ADDRESSED:  -Reduce symptoms of anxiety and depression  INTERVENTIONS: Solution Focused   ASSESSMENT:  Pt currently experiencing Bipolar affective disorder.  Pt may benefit from psychoeducation, brief therapeutic interventions regarding coping with symptoms of anxiety and depression, and referral to psychiatry for [facility] med management.   PLAN: 1. F/U with behavioral health clinician: One month, or as needed 2. Behavioral Health meds: none current (will consider after establishing with [facility]) 3. Behavioral recommendations:  -Call [facility] this afternoon, to set up initial appointment  -Consider reading educational materials regarding coping with symptoms of anxiety and depression  4. Referral: Brief Counseling/Psychotherapy, Psychoeducation and Referral to [place] provider 5. From scale of 1-10,  how likely are you to follow plan: [place]:   Warm Hand Off Completed.        Depression screen PHQ 2/9 04/25/2016  Decreased Interest 3  Down, Depressed, Hopeless 3  PHQ - 2 Score 6  Altered sleeping 3  Tired, decreased energy 3  Change in appetite 0  Feeling bad or failure about yourself  3  Trouble concentrating 0  Moving slowly or fidgety/restless 0  Suicidal thoughts 0  PHQ-9 Score 15   GAD 7 : Generalized Anxiety Score 04/25/2016  Nervous, Anxious, on Edge 1  Control/stop worrying 3  Worry too much - different things 3  Trouble relaxing 3  Restless 3  Easily annoyed or irritable 3  Afraid - awful might happen 3  Total GAD 7 Score 19

## 2016-05-30 ENCOUNTER — Other Ambulatory Visit: Payer: Self-pay

## 2016-06-01 ENCOUNTER — Encounter (HOSPITAL_COMMUNITY): Payer: Self-pay

## 2016-06-01 ENCOUNTER — Ambulatory Visit (HOSPITAL_COMMUNITY)
Admission: RE | Admit: 2016-06-01 | Discharge: 2016-06-01 | Disposition: A | Discharge status: Home / Self Care | Payer: Medicaid Other | Source: Ambulatory Visit | Attending: Obstetrics & Gynecology | Admitting: Obstetrics & Gynecology

## 2016-06-01 ENCOUNTER — Other Ambulatory Visit (HOSPITAL_COMMUNITY): Payer: Self-pay | Admitting: Maternal and Fetal Medicine

## 2016-06-01 DIAGNOSIS — Z3A17 17 weeks gestation of pregnancy: Secondary | ICD-10-CM | POA: Insufficient documentation

## 2016-06-01 DIAGNOSIS — O10012 Pre-existing essential hypertension complicating pregnancy, second trimester: Secondary | ICD-10-CM | POA: Insufficient documentation

## 2016-06-01 DIAGNOSIS — O99332 Smoking (tobacco) complicating pregnancy, second trimester: Secondary | ICD-10-CM

## 2016-06-01 DIAGNOSIS — O99322 Drug use complicating pregnancy, second trimester: Secondary | ICD-10-CM

## 2016-06-01 DIAGNOSIS — O10912 Unspecified pre-existing hypertension complicating pregnancy, second trimester: Secondary | ICD-10-CM

## 2016-06-01 DIAGNOSIS — O30032 Twin pregnancy, monochorionic/diamniotic, second trimester: Secondary | ICD-10-CM | POA: Diagnosis not present

## 2016-06-01 DIAGNOSIS — O09292 Supervision of pregnancy with other poor reproductive or obstetric history, second trimester: Secondary | ICD-10-CM | POA: Diagnosis not present

## 2016-06-07 ENCOUNTER — Encounter (HOSPITAL_COMMUNITY): Payer: Self-pay

## 2016-06-07 ENCOUNTER — Ambulatory Visit (HOSPITAL_COMMUNITY)
Admission: RE | Admit: 2016-06-07 | Discharge: 2016-06-07 | Disposition: A | Discharge status: Home / Self Care | Payer: Medicaid Other | Source: Ambulatory Visit | Attending: Obstetrics & Gynecology | Admitting: Obstetrics & Gynecology

## 2016-06-07 DIAGNOSIS — O99332 Smoking (tobacco) complicating pregnancy, second trimester: Secondary | ICD-10-CM | POA: Insufficient documentation

## 2016-06-07 DIAGNOSIS — O99322 Drug use complicating pregnancy, second trimester: Secondary | ICD-10-CM | POA: Insufficient documentation

## 2016-06-07 DIAGNOSIS — Z3A18 18 weeks gestation of pregnancy: Secondary | ICD-10-CM | POA: Diagnosis not present

## 2016-06-07 DIAGNOSIS — Z363 Encounter for antenatal screening for malformations: Secondary | ICD-10-CM | POA: Diagnosis not present

## 2016-06-07 DIAGNOSIS — O09292 Supervision of pregnancy with other poor reproductive or obstetric history, second trimester: Secondary | ICD-10-CM | POA: Diagnosis not present

## 2016-06-07 DIAGNOSIS — O30032 Twin pregnancy, monochorionic/diamniotic, second trimester: Secondary | ICD-10-CM

## 2016-06-07 DIAGNOSIS — O10012 Pre-existing essential hypertension complicating pregnancy, second trimester: Secondary | ICD-10-CM | POA: Diagnosis not present

## 2016-06-08 ENCOUNTER — Other Ambulatory Visit (HOSPITAL_COMMUNITY): Payer: Self-pay | Admitting: *Deleted

## 2016-06-08 DIAGNOSIS — O30032 Twin pregnancy, monochorionic/diamniotic, second trimester: Secondary | ICD-10-CM

## 2016-06-14 ENCOUNTER — Other Ambulatory Visit (HOSPITAL_COMMUNITY): Payer: Self-pay | Admitting: Maternal and Fetal Medicine

## 2016-06-14 ENCOUNTER — Encounter (HOSPITAL_COMMUNITY): Payer: Self-pay

## 2016-06-14 ENCOUNTER — Ambulatory Visit (HOSPITAL_COMMUNITY)
Admission: RE | Admit: 2016-06-14 | Discharge: 2016-06-14 | Disposition: A | Discharge status: Home / Self Care | Payer: Medicaid Other | Source: Ambulatory Visit | Attending: Obstetrics & Gynecology | Admitting: Obstetrics & Gynecology

## 2016-06-14 DIAGNOSIS — O09292 Supervision of pregnancy with other poor reproductive or obstetric history, second trimester: Secondary | ICD-10-CM | POA: Diagnosis not present

## 2016-06-14 DIAGNOSIS — O30032 Twin pregnancy, monochorionic/diamniotic, second trimester: Secondary | ICD-10-CM

## 2016-06-14 DIAGNOSIS — Z3A19 19 weeks gestation of pregnancy: Secondary | ICD-10-CM | POA: Diagnosis not present

## 2016-06-14 DIAGNOSIS — O99332 Smoking (tobacco) complicating pregnancy, second trimester: Secondary | ICD-10-CM | POA: Insufficient documentation

## 2016-06-14 DIAGNOSIS — O99322 Drug use complicating pregnancy, second trimester: Secondary | ICD-10-CM | POA: Insufficient documentation

## 2016-06-14 DIAGNOSIS — O10012 Pre-existing essential hypertension complicating pregnancy, second trimester: Secondary | ICD-10-CM | POA: Diagnosis not present

## 2016-06-14 NOTE — Addendum Note (Signed)
Encounter addended by: Eusebio Friendly, RT, RVT, RDMS on: 06/14/2016  2:27 PM<BR>    Actions taken: Imaging Exam ended

## 2016-06-15 ENCOUNTER — Ambulatory Visit (INDEPENDENT_AMBULATORY_CARE_PROVIDER_SITE_OTHER): Payer: Medicaid Other | Admitting: Family Medicine

## 2016-06-15 VITALS — BP 111/80 | HR 90 | Wt 179.0 lb

## 2016-06-15 DIAGNOSIS — O30032 Twin pregnancy, monochorionic/diamniotic, second trimester: Secondary | ICD-10-CM

## 2016-06-15 DIAGNOSIS — O0992 Supervision of high risk pregnancy, unspecified, second trimester: Secondary | ICD-10-CM

## 2016-06-15 DIAGNOSIS — F319 Bipolar disorder, unspecified: Secondary | ICD-10-CM | POA: Diagnosis not present

## 2016-06-15 DIAGNOSIS — O99320 Drug use complicating pregnancy, unspecified trimester: Secondary | ICD-10-CM

## 2016-06-15 DIAGNOSIS — O30039 Twin pregnancy, monochorionic/diamniotic, unspecified trimester: Secondary | ICD-10-CM

## 2016-06-15 DIAGNOSIS — O99322 Drug use complicating pregnancy, second trimester: Secondary | ICD-10-CM

## 2016-06-15 NOTE — Patient Instructions (Signed)
Second Trimester of Pregnancy The second trimester is from week 13 through week 28 (months 4 through 6). The second trimester is often a time when you feel your best. Your body has also adjusted to being pregnant, and you begin to feel better physically. Usually, morning sickness has lessened or quit completely, you may have more energy, and you may have an increase in appetite. The second trimester is also a time when the fetus is growing rapidly. At the end of the sixth month, the fetus is about 9 inches long and weighs about 1 pounds. You will likely begin to feel the baby move (quickening) between 18 and 20 weeks of the pregnancy. Body changes during your second trimester Your body continues to go through many changes during your second trimester. The changes vary from woman to woman.  Your weight will continue to increase. You will notice your lower abdomen bulging out.  You may begin to get stretch marks on your hips, abdomen, and breasts.  You may develop headaches that can be relieved by medicines. The medicines should be approved by your health care provider.  You may urinate more often because the fetus is pressing on your bladder.  You may develop or continue to have heartburn as a result of your pregnancy.  You may develop constipation because certain hormones are causing the muscles that push waste through your intestines to slow down.  You may develop hemorrhoids or swollen, bulging veins (varicose veins).  You may have back pain. This is caused by:  Weight gain.  Pregnancy hormones that are relaxing the joints in your pelvis.  A shift in weight and the muscles that support your balance.  Your breasts will continue to grow and they will continue to become tender.  Your gums may bleed and may be sensitive to brushing and flossing.  Dark spots or blotches (chloasma, mask of pregnancy) may develop on your face. This will likely fade after the baby is born.  A dark line  from your belly button to the pubic area (linea nigra) may appear. This will likely fade after the baby is born.  You may have changes in your hair. These can include thickening of your hair, rapid growth, and changes in texture. Some women also have hair loss during or after pregnancy, or hair that feels dry or thin. Your hair will most likely return to normal after your baby is born. What to expect at prenatal visits During a routine prenatal visit:  You will be weighed to make sure you and the fetus are growing normally.  Your blood pressure will be taken.  Your abdomen will be measured to track your baby's growth.  The fetal heartbeat will be listened to.  Any test results from the previous visit will be discussed. Your health care provider may ask you:  How you are feeling.  If you are feeling the baby move.  If you have had any abnormal symptoms, such as leaking fluid, bleeding, severe headaches, or abdominal cramping.  If you are using any tobacco products, including cigarettes, chewing tobacco, and electronic cigarettes.  If you have any questions. Other tests that may be performed during your second trimester include:  Blood tests that check for:  Low iron levels (anemia).  Gestational diabetes (between 24 and 28 weeks).  Rh antibodies. This is to check for a protein on red blood cells (Rh factor).  Urine tests to check for infections, diabetes, or protein in the urine.  An ultrasound to  confirm the proper growth and development of the baby.  An amniocentesis to check for possible genetic problems.  Fetal screens for spina bifida and Down syndrome.  HIV (human immunodeficiency virus) testing. Routine prenatal testing includes screening for HIV, unless you choose not to have this test. Follow these instructions at home: Eating and drinking  Continue to eat regular, healthy meals.  Avoid raw meat, uncooked cheese, cat litter boxes, and soil used by cats. These  carry germs that can cause birth defects in the baby.  Take your prenatal vitamins.  Take 1500-2000 mg of calcium daily starting at the 20th week of pregnancy until you deliver your baby.  If you develop constipation:  Take over-the-counter or prescription medicines.  Drink enough fluid to keep your urine clear or pale yellow.  Eat foods that are high in fiber, such as fresh fruits and vegetables, whole grains, and beans.  Limit foods that are high in fat and processed sugars, such as fried and sweet foods. Activity  Exercise only as directed by your health care provider. Experiencing uterine cramps is a good sign to stop exercising.  Avoid heavy lifting, wear low heel shoes, and practice good posture.  Wear your seat belt at all times when driving.  Rest with your legs elevated if you have leg cramps or low back pain.  Wear a good support bra for breast tenderness.  Do not use hot tubs, steam rooms, or saunas. Lifestyle  Avoid all smoking, herbs, alcohol, and unprescribed drugs. These chemicals affect the formation and growth of the baby.  Do not use any products that contain nicotine or tobacco, such as cigarettes and e-cigarettes. If you need help quitting, ask your health care provider.  A sexual relationship may be continued unless your health care provider directs you otherwise. General instructions  Follow your health care provider's instructions regarding medicine use. There are medicines that are either safe or unsafe to take during pregnancy.  Take warm sitz baths to soothe any pain or discomfort caused by hemorrhoids. Use hemorrhoid cream if your health care provider approves.  If you develop varicose veins, wear support hose. Elevate your feet for 15 minutes, 3-4 times a day. Limit salt in your diet.  Visit your dentist if you have not gone yet during your pregnancy. Use a soft toothbrush to brush your teeth and be gentle when you floss.  Keep all follow-up  prenatal visits as told by your health care provider. This is important. Contact a health care provider if:  You have dizziness.  You have mild pelvic cramps, pelvic pressure, or nagging pain in the abdominal area.  You have persistent nausea, vomiting, or diarrhea.  You have a bad smelling vaginal discharge.  You have pain with urination. Get help right away if:  You have a fever.  You are leaking fluid from your vagina.  You have spotting or bleeding from your vagina.  You have severe abdominal cramping or pain.  You have rapid weight gain or weight loss.  You have shortness of breath with chest pain.  You notice sudden or extreme swelling of your face, hands, ankles, feet, or legs.  You have not felt your baby move in over an hour.  You have severe headaches that do not go away with medicine.  You have vision changes. Summary  The second trimester is from week 13 through week 28 (months 4 through 6). It is also a time when the fetus is growing rapidly.  Your body goes  through many changes during pregnancy. The changes vary from woman to woman.  Avoid all smoking, herbs, alcohol, and unprescribed drugs. These chemicals affect the formation and growth your baby.  Do not use any tobacco products, such as cigarettes, chewing tobacco, and e-cigarettes. If you need help quitting, ask your health care provider.  Contact your health care provider if you have any questions. Keep all prenatal visits as told by your health care provider. This is important. This information is not intended to replace advice given to you by your health care provider. Make sure you discuss any questions you have with your health care provider. Document Released: 04/11/2001 Document Revised: 09/23/2015 Document Reviewed: 06/18/2012 Elsevier Interactive Patient Education  2017 [employer].   Breastfeeding Deciding to breastfeed is one of the best choices you can make for you and your baby. A  change in hormones during pregnancy causes your breast tissue to grow and increases the number and size of your milk ducts. These hormones also allow proteins, sugars, and fats from your blood supply to make breast milk in your milk-producing glands. Hormones prevent breast milk from being released before your baby is born as well as prompt milk flow after birth. Once breastfeeding has begun, thoughts of your baby, as well as his or her sucking or crying, can stimulate the release of milk from your milk-producing glands. Benefits of breastfeeding For Your Baby  Your first milk (colostrum) helps your baby's digestive system function better.  There are antibodies in your milk that help your baby fight off infections.  Your baby has a lower incidence of asthma, allergies, and sudden infant death syndrome.  The nutrients in breast milk are better for your baby than infant formulas and are designed uniquely for your baby's needs.  Breast milk improves your baby's brain development.  Your baby is less likely to develop other conditions, such as childhood obesity, asthma, or type 2 diabetes mellitus. For You  Breastfeeding helps to create a very special bond between you and your baby.  Breastfeeding is convenient. Breast milk is always available at the correct temperature and costs nothing.  Breastfeeding helps to burn calories and helps you lose the weight gained during pregnancy.  Breastfeeding makes your uterus contract to its prepregnancy size faster and slows bleeding (lochia) after you give birth.  Breastfeeding helps to lower your risk of developing type 2 diabetes mellitus, osteoporosis, and breast or ovarian cancer later in life. Signs that your baby is hungry Early Signs of Hunger  Increased alertness or activity.  Stretching.  Movement of the head from side to side.  Movement of the head and opening of the mouth when the corner of the mouth or cheek is stroked  (rooting).  Increased sucking sounds, smacking lips, cooing, sighing, or squeaking.  Hand-to-mouth movements.  Increased sucking of fingers or hands. Late Signs of Hunger  Fussing.  Intermittent crying. Extreme Signs of Hunger  Signs of extreme hunger will require calming and consoling before your baby will be able to breastfeed successfully. Do not wait for the following signs of extreme hunger to occur before you initiate breastfeeding:  Restlessness.  A loud, strong cry.  Screaming. Breastfeeding basics  Breastfeeding Initiation  Find a comfortable place to sit or lie down, with your neck and back well supported.  Place a pillow or rolled up blanket under your baby to bring him or her to the level of your breast (if you are seated). Nursing pillows are specially designed to help  support your arms and your baby while you breastfeed.  Make sure that your baby's abdomen is facing your abdomen.  Gently massage your breast. With your fingertips, massage from your chest wall toward your nipple in a circular motion. This encourages milk flow. You may need to continue this action during the feeding if your milk flows slowly.  Support your breast with 4 fingers underneath and your thumb above your nipple. Make sure your fingers are well away from your nipple and your baby's mouth.  Stroke your baby's lips gently with your finger or nipple.  When your baby's mouth is open wide enough, quickly bring your baby to your breast, placing your entire nipple and as much of the colored area around your nipple (areola) as possible into your baby's mouth.  More areola should be visible above your baby's upper lip than below the lower lip.  Your baby's tongue should be between his or her lower gum and your breast.  Ensure that your baby's mouth is correctly positioned around your nipple (latched). Your baby's lips should create a seal on your breast and be turned out (everted).  It is common  for your baby to suck about 2-3 minutes in order to start the flow of breast milk. Latching  Teaching your baby how to latch on to your breast properly is very important. An improper latch can cause nipple pain and decreased milk supply for you and poor weight gain in your baby. Also, if your baby is not latched onto your nipple properly, he or she may swallow some air during feeding. This can make your baby fussy. Burping your baby when you switch breasts during the feeding can help to get rid of the air. However, teaching your baby to latch on properly is still the best way to prevent fussiness from swallowing air while breastfeeding. Signs that your baby has successfully latched on to your nipple:  Silent tugging or silent sucking, without causing you pain.  Swallowing heard between every 3-4 sucks.  Muscle movement above and in front of his or her ears while sucking. Signs that your baby has not successfully latched on to nipple:  Sucking sounds or smacking sounds from your baby while breastfeeding.  Nipple pain. If you think your baby has not latched on correctly, slip your finger into the corner of your baby's mouth to break the suction and place it between your baby's gums. Attempt breastfeeding initiation again. Signs of Successful Breastfeeding  Signs from your baby:  A gradual decrease in the number of sucks or complete cessation of sucking.  Falling asleep.  Relaxation of his or her body.  Retention of a small amount of milk in his or her mouth.  Letting go of your breast by himself or herself. Signs from you:  Breasts that have increased in firmness, weight, and size 1-3 hours after feeding.  Breasts that are softer immediately after breastfeeding.  Increased milk volume, as well as a change in milk consistency and color by the fifth day of breastfeeding.  Nipples that are not sore, cracked, or bleeding. Signs That Your [employer] is Getting Enough Milk  Wetting at least  1-2 diapers during the first 24 hours after birth.  Wetting at least 5-6 diapers every 24 hours for the first week after birth. The urine should be clear or pale yellow by 5 days after birth.  Wetting 6-8 diapers every 24 hours as your baby continues to grow and develop.  At least 3 stools in  a 24-hour period by age 65 days. The stool should be soft and yellow.  At least 3 stools in a 24-hour period by age 67 days. The stool should be seedy and yellow.  No loss of weight greater than 10% of birth weight during the first 29 days of age.  Average weight gain of 4-7 ounces (113-198 g) per week after age 8 days.  Consistent daily weight gain by age 656 days, without weight loss after the age of 2 weeks. After a feeding, your baby may spit up a small amount. This is common. Breastfeeding frequency and duration Frequent feeding will help you make more milk and can prevent sore nipples and breast engorgement. Breastfeed when you feel the need to reduce the fullness of your breasts or when your baby shows signs of hunger. This is called "breastfeeding on demand." Avoid introducing a pacifier to your baby while you are working to establish breastfeeding (the first 4-6 weeks after your baby is born). After this time you may choose to use a pacifier. Research has shown that pacifier use during the first year of a baby's life decreases the risk of sudden infant death syndrome (SIDS). Allow your baby to feed on each breast as long as he or she wants. Breastfeed until your baby is finished feeding. When your baby unlatches or falls asleep while feeding from the first breast, offer the second breast. Because newborns are often sleepy in the first few weeks of life, you may need to awaken your baby to get him or her to feed. Breastfeeding times will vary from baby to baby. However, the following rules can serve as a guide to help you ensure that your baby is properly fed:  Newborns (babies 21 weeks of age or younger)  may breastfeed every 1-3 hours.  Newborns should not go longer than 3 hours during the day or 5 hours during the night without breastfeeding.  You should breastfeed your baby a minimum of 8 times in a 24-hour period until you begin to introduce solid foods to your baby at around 73 months of age. Breast milk pumping Pumping and storing breast milk allows you to ensure that your baby is exclusively fed your breast milk, even at times when you are unable to breastfeed. This is especially important if you are going back to work while you are still breastfeeding or when you are not able to be present during feedings. Your lactation consultant can give you guidelines on how long it is safe to store breast milk. A breast pump is a machine that allows you to pump milk from your breast into a sterile bottle. The pumped breast milk can then be stored in a refrigerator or freezer. Some breast pumps are operated by hand, while others use electricity. Ask your lactation consultant which type will work best for you. Breast pumps can be purchased, but some hospitals and breastfeeding support groups lease breast pumps on a monthly basis. A lactation consultant can teach you how to hand express breast milk, if you prefer not to use a pump. Caring for your breasts while you breastfeed Nipples can become dry, cracked, and sore while breastfeeding. The following recommendations can help keep your breasts moisturized and healthy:  Avoid using soap on your nipples.  Wear a supportive bra. Although not required, special nursing bras and tank tops are designed to allow access to your breasts for breastfeeding without taking off your entire bra or top. Avoid wearing underwire-style bras or extremely tight  bras.  Air dry your nipples for 3-20minutes after each feeding.  Use only cotton bra pads to absorb leaked breast milk. Leaking of breast milk between feedings is normal.  Use lanolin on your nipples after breastfeeding.  Lanolin helps to maintain your skin's normal moisture barrier. If you use pure lanolin, you do not need to wash it off before feeding your baby again. Pure lanolin is not toxic to your baby. You may also hand express a few drops of breast milk and gently massage that milk into your nipples and allow the milk to air dry. In the first few weeks after giving birth, some women experience extremely full breasts (engorgement). Engorgement can make your breasts feel heavy, warm, and tender to the touch. Engorgement peaks within 3-5 days after you give birth. The following recommendations can help ease engorgement:  Completely empty your breasts while breastfeeding or pumping. You may want to start by applying warm, moist heat (in the shower or with warm water-soaked hand towels) just before feeding or pumping. This increases circulation and helps the milk flow. If your baby does not completely empty your breasts while breastfeeding, pump any extra milk after he or she is finished.  Wear a snug bra (nursing or regular) or tank top for 1-2 days to signal your body to slightly decrease milk production.  Apply ice packs to your breasts, unless this is too uncomfortable for you.  Make sure that your baby is latched on and positioned properly while breastfeeding. If engorgement persists after 48 hours of following these recommendations, contact your health care provider or a Science writer. Overall health care recommendations while breastfeeding  Eat healthy foods. Alternate between meals and snacks, eating 3 of each per day. Because what you eat affects your breast milk, some of the foods may make your baby more irritable than usual. Avoid eating these foods if you are sure that they are negatively affecting your baby.  Drink milk, fruit juice, and water to satisfy your thirst (about 10 glasses a day).  Rest often, relax, and continue to take your prenatal vitamins to prevent fatigue, stress, and  anemia.  Continue breast self-awareness checks.  Avoid chewing and smoking tobacco. Chemicals from cigarettes that pass into breast milk and exposure to secondhand smoke may harm your baby.  Avoid alcohol and drug use, including marijuana. Some medicines that may be harmful to your baby can pass through breast milk. It is important to ask your health care provider before taking any medicine, including all over-the-counter and prescription medicine as well as vitamin and herbal supplements. It is possible to become pregnant while breastfeeding. If birth control is desired, ask your health care provider about options that will be safe for your baby. Contact a health care provider if:  You feel like you want to stop breastfeeding or have become frustrated with breastfeeding.  You have painful breasts or nipples.  Your nipples are cracked or bleeding.  Your breasts are red, tender, or warm.  You have a swollen area on either breast.  You have a fever or chills.  You have nausea or vomiting.  You have drainage other than breast milk from your nipples.  Your breasts do not become full before feedings by the fifth day after you give birth.  You feel sad and depressed.  Your baby is too sleepy to eat well.  Your baby is having trouble sleeping.  Your baby is wetting less than 3 diapers in a 24-hour period.  Your baby  has less than 3 stools in a 24-hour period.  Your baby's skin or the white part of his or her eyes becomes yellow.  Your baby is not gaining weight by 39 days of age. Get help right away if:  Your baby is overly tired (lethargic) and does not want to wake up and feed.  Your baby develops an unexplained fever. This information is not intended to replace advice given to you by your health care provider. Make sure you discuss any questions you have with your health care provider. Document Released: 04/17/2005 Document Revised: 09/29/2015 Document Reviewed:  10/09/2012 Elsevier Interactive Patient Education  2017 [employer].

## 2016-06-15 NOTE — Progress Notes (Signed)
   PRENATAL VISIT NOTE  Subjective:  Robin Horn is a 31 y.o. [id number] at [redacted]w[redacted]d being seen today for ongoing prenatal care.  She is currently monitored for the following issues for this high-risk pregnancy and has Monochorionic diamniotic twin pregnancy, antepartum; Supervision of high-risk pregnancy; Drug use affecting pregnancy, antepartum; Bipolar 1 disorder ([place]); and Insomnia on her problem list.  Patient reports insomnia, [name] is not keeping her asleep.   .  .  Movement: Present. Denies leaking of fluid.   The following portions of the patient's history were reviewed and updated as appropriate: allergies, current medications, past family history, past medical history, past social history, past surgical history and problem list. Problem list updated.  Objective:   Vitals:   06/15/16 1113  BP: 111/80  Pulse: 90  Weight: 179 lb (81.2 kg)    Fetal Status: Fetal Heart Rate (bpm): 156/158   Movement: Present     General:  Alert, oriented and cooperative. Patient is in no acute distress.  Skin: Skin is warm and dry. No rash noted.   Cardiovascular: Normal heart rate noted  Respiratory: Normal respiratory effort, no problems with respiration noted  Abdomen: Soft, gravid, appropriate for gestational age. Pain/Pressure: Present     Pelvic:  Cervical exam deferred        Extremities: Normal range of motion.     Mental Status: Normal mood and affect. Normal behavior. Normal judgment and thought content.   Assessment and Plan:  Pregnancy: [name] at [redacted]w[redacted]d  1. Monochorionic diamniotic twin pregnancy, antepartum For weekly u/s - needs fetal echo scheduled  2. Supervision of high risk pregnancy in second trimester Had first screen and normal anatomy--will not do AFP due to abnl with twins  3. Drug use affecting pregnancy, antepartum None lately  4. Bipolar 1 disorder ([place]) Uninterested in medications at this time, due to pregnancy. She has seen [place] and given [employer] of  the [place] number.   General obstetric precautions including but not limited to vaginal bleeding, contractions, leaking of fluid and fetal movement were reviewed in detail with the patient. Please refer to After Visit Summary for other counseling recommendations.  Return in 4 weeks (on 07/13/2016).   [name], MD

## 2016-06-18 ENCOUNTER — Encounter (HOSPITAL_COMMUNITY): Payer: Self-pay

## 2016-06-18 ENCOUNTER — Inpatient Hospital Stay (HOSPITAL_COMMUNITY)
Admission: AD | Admit: 2016-06-18 | Discharge: 2016-06-18 | Disposition: A | Discharge status: Home / Self Care | Payer: Medicaid Other | Source: Ambulatory Visit | Attending: Obstetrics & Gynecology | Admitting: Obstetrics & Gynecology

## 2016-06-18 DIAGNOSIS — O99332 Smoking (tobacco) complicating pregnancy, second trimester: Secondary | ICD-10-CM | POA: Insufficient documentation

## 2016-06-18 DIAGNOSIS — O3482 Maternal care for other abnormalities of pelvic organs, second trimester: Secondary | ICD-10-CM | POA: Diagnosis not present

## 2016-06-18 DIAGNOSIS — F329 Major depressive disorder, single episode, unspecified: Secondary | ICD-10-CM | POA: Insufficient documentation

## 2016-06-18 DIAGNOSIS — F419 Anxiety disorder, unspecified: Secondary | ICD-10-CM | POA: Insufficient documentation

## 2016-06-18 DIAGNOSIS — Z3A20 20 weeks gestation of pregnancy: Secondary | ICD-10-CM | POA: Insufficient documentation

## 2016-06-18 DIAGNOSIS — N83209 Unspecified ovarian cyst, unspecified side: Secondary | ICD-10-CM | POA: Insufficient documentation

## 2016-06-18 DIAGNOSIS — O30032 Twin pregnancy, monochorionic/diamniotic, second trimester: Secondary | ICD-10-CM | POA: Diagnosis not present

## 2016-06-18 DIAGNOSIS — M549 Dorsalgia, unspecified: Secondary | ICD-10-CM | POA: Diagnosis not present

## 2016-06-18 DIAGNOSIS — R102 Pelvic and perineal pain: Secondary | ICD-10-CM | POA: Insufficient documentation

## 2016-06-18 DIAGNOSIS — R110 Nausea: Secondary | ICD-10-CM | POA: Diagnosis not present

## 2016-06-18 DIAGNOSIS — O0992 Supervision of high risk pregnancy, unspecified, second trimester: Secondary | ICD-10-CM

## 2016-06-18 DIAGNOSIS — Z9889 Other specified postprocedural states: Secondary | ICD-10-CM | POA: Diagnosis not present

## 2016-06-18 DIAGNOSIS — O99342 Other mental disorders complicating pregnancy, second trimester: Secondary | ICD-10-CM | POA: Insufficient documentation

## 2016-06-18 DIAGNOSIS — F17210 Nicotine dependence, cigarettes, uncomplicated: Secondary | ICD-10-CM | POA: Diagnosis not present

## 2016-06-18 DIAGNOSIS — E739 Lactose intolerance, unspecified: Secondary | ICD-10-CM | POA: Insufficient documentation

## 2016-06-18 DIAGNOSIS — O26899 Other specified pregnancy related conditions, unspecified trimester: Secondary | ICD-10-CM

## 2016-06-18 DIAGNOSIS — O30039 Twin pregnancy, monochorionic/diamniotic, unspecified trimester: Secondary | ICD-10-CM

## 2016-06-18 DIAGNOSIS — O26892 Other specified pregnancy related conditions, second trimester: Secondary | ICD-10-CM | POA: Diagnosis not present

## 2016-06-18 DIAGNOSIS — O9989 Other specified diseases and conditions complicating pregnancy, childbirth and the puerperium: Secondary | ICD-10-CM

## 2016-06-18 DIAGNOSIS — N898 Other specified noninflammatory disorders of vagina: Secondary | ICD-10-CM | POA: Diagnosis not present

## 2016-06-18 DIAGNOSIS — Z79899 Other long term (current) drug therapy: Secondary | ICD-10-CM | POA: Diagnosis not present

## 2016-06-18 LAB — URINALYSIS, ROUTINE W REFLEX MICROSCOPIC
Bilirubin Urine: NEGATIVE
Glucose, UA: NEGATIVE mg/dL
Hgb urine dipstick: NEGATIVE
Ketones, ur: NEGATIVE mg/dL
NITRITE: NEGATIVE
PROTEIN: NEGATIVE mg/dL
SPECIFIC GRAVITY, URINE: 1.01 (ref 1.005–1.030)
pH: 6 (ref 5.0–8.0)

## 2016-06-18 LAB — WET PREP, GENITAL
CLUE CELLS WET PREP: NONE SEEN
SPERM: NONE SEEN
Trich, Wet Prep: NONE SEEN
YEAST WET PREP: NONE SEEN

## 2016-06-18 LAB — URINALYSIS, MICROSCOPIC (REFLEX)

## 2016-06-18 MED ORDER — BUTALBITAL-APAP-CAFFEINE 50-325-40 MG PO TABS
2.0000 | ORAL_TABLET | Freq: Four times a day (QID) | ORAL | Status: DC | PRN
  Administered 2016-06-18: 2 via ORAL
  Filled 2016-06-18: qty 2

## 2016-06-18 MED ORDER — CYCLOBENZAPRINE HCL 10 MG PO TABS
10.0000 mg | ORAL_TABLET | Freq: Three times a day (TID) | ORAL | Status: DC | PRN
  Administered 2016-06-18: 10 mg via ORAL
  Filled 2016-06-18: qty 1

## 2016-06-18 MED ORDER — CYCLOBENZAPRINE HCL 10 MG PO TABS: 10.0000 mg | ORAL_TABLET | Freq: Three times a day (TID) | ORAL | 0 refills | Status: DC | PRN

## 2016-06-18 MED ORDER — BUTALBITAL-APAP-CAFFEINE 50-325-40 MG PO TABS: 1.0000 | ORAL_TABLET | Freq: Four times a day (QID) | ORAL | 0 refills | Status: DC | PRN

## 2016-06-18 MED ORDER — COMFORT FIT MATERNITY SUPP MED MISC: 1.0000 | Freq: Every day | 0 refills | Status: DC

## 2016-06-18 NOTE — MAU Provider Note (Signed)
History     CSN: [id number]  Arrival date and time: 06/18/16 1806   First Provider Initiated Contact with Patient 06/18/16 1843      No chief complaint on file.  [name] @20  wks here with vaginal discharge, abd pain, and HA. She reports brown discharge since yesterday and vaginal odor x1 week. Itching and irritation are present. No new partner but reports her partner has cheated on her before and gave her Chlamydia 3 mos ago. No VB. She reports intermittent pelvic pain and pressure x2 days. Pressure causes difficulty with walking. No urinary sx. She reports intermittent ctx at times, worse with ambulation. She also reports low back pain. She describes as constant when she's standing up. Rates pain 8/10. She has not used anything for the pain. She c/o migraine HA w/aura x2 days. She used Ibuprofen and Tylenol and had no relief. Associated sx is nausea. She has hx of migraine HA prior to pregnancy. Pregnancy has been complicated by mono/di twins, +CT, +cocaine use, & Bipolar.     OB History    Gravida Para Term Preterm AB Living   8 4 4  0 3 4   SAB TAB Ectopic Multiple Live Births   0 3 0 0 4      Past Medical History:  Diagnosis Date  . Abnormal Pap smear of cervix   . Anxiety   . Chlamydia   . Depression   . Gonorrhea   . Herpes genitalia   . Migraines   . Ovarian cyst   . Preeclampsia   . Pregnancy induced hypertension     Past Surgical History:  Procedure Laterality Date  . COLPOSCOPY    . DILATION AND CURETTAGE OF UTERUS    . TONSILLECTOMY      Family History  Problem Relation Age of Onset  . Kidney disease Mother   . Cancer Paternal Grandmother   . Other Neg Hx     Social History  Substance Use Topics  . Smoking status: Light Tobacco Smoker    Packs/day: 0.25    Types: Cigarettes  . Smokeless tobacco: Never Used  . Alcohol use 0.6 oz/week    1 Glasses of wine per week     Comment: Once every 2 months. Nothing since finding out about pregnancy.      Allergies:  Allergies  Allergen Reactions  . Lactose Intolerance (Gi) Diarrhea    Prescriptions Prior to Admission  Medication Sig Dispense Refill Last Dose  . Prenatal Multivit-Min-Fe-FA (PRE-NATAL FORMULA) TABS Take 1 tablet by mouth daily. 30 each 6 Taking  . ranitidine (ZANTAC) 150 MG tablet Take 1 tablet (150 mg total) by mouth 2 (two) times daily. 60 tablet 6 Taking  . zolpidem (AMBIEN) 5 MG tablet Take 1 tablet (5 mg total) by mouth at bedtime as needed for sleep. 30 tablet 0 Taking    Review of Systems  Constitutional: Negative for chills and fever.  Eyes: Positive for visual disturbance.  Gastrointestinal: Positive for abdominal pain and constipation.  Genitourinary: Positive for pelvic pain and vaginal discharge. Negative for vaginal bleeding.  Neurological: Positive for headaches.   Physical Exam   Blood pressure 124/79, pulse 95, temperature 98.8 F (37.1 C), temperature source Oral, resp. rate 18, height 5\' 3"  (1.6 m), weight 82.1 kg (181 lb), last menstrual period 01/14/2016, SpO2 100 %.  Physical Exam  Nursing note and vitals reviewed. Constitutional: She is oriented to person, place, and time. She appears well-developed and well-nourished. No distress.  HENT:  Head: Normocephalic and atraumatic.  Neck: Normal range of motion.  Cardiovascular: Normal rate.   Respiratory: Effort normal.  GI: Soft. She exhibits no distension. There is no tenderness.  gravid  Genitourinary:  Genitourinary Comments: External: no lesions or erythema Vagina: rugated, parous, thick frothy white discharge SVE: closed/thick   Musculoskeletal: Normal range of motion.  Neurological: She is alert and oriented to person, place, and time.  Skin: Skin is warm and dry.  Psychiatric: She has a normal mood and affect.  FHT A: 144 bpm FHT B: 148 bpm  Results for orders placed or performed during the hospital encounter of 06/18/16 (from the past 24 hour(s))  Urinalysis, Routine w  reflex microscopic     Status: Abnormal   Collection Time: 06/18/16  6:15 PM  Result Value Ref Range   Color, Urine YELLOW YELLOW   APPearance HAZY (A) CLEAR   Specific Gravity, Urine 1.010 1.005 - 1.030   pH 6.0 5.0 - 8.0   Glucose, UA NEGATIVE NEGATIVE mg/dL   Hgb urine dipstick NEGATIVE NEGATIVE   Bilirubin Urine NEGATIVE NEGATIVE   Ketones, ur NEGATIVE NEGATIVE mg/dL   Protein, ur NEGATIVE NEGATIVE mg/dL   Nitrite NEGATIVE NEGATIVE   Leukocytes, UA SMALL (A) NEGATIVE  Urinalysis, Microscopic (reflex)     Status: Abnormal   Collection Time: 06/18/16  6:15 PM  Result Value Ref Range   RBC / HPF 0-5 0 - 5 RBC/hpf   WBC, UA 0-5 0 - 5 WBC/hpf   Bacteria, UA FEW (A) NONE SEEN   Squamous Epithelial / LPF 0-5 (A) NONE SEEN  Wet prep, genital     Status: Abnormal   Collection Time: 06/18/16  6:58 PM  Result Value Ref Range   Yeast Wet Prep HPF POC NONE SEEN NONE SEEN   Trich, Wet Prep NONE SEEN NONE SEEN   Clue Cells Wet Prep HPF POC NONE SEEN NONE SEEN   WBC, Wet Prep HPF POC FEW (A) NONE SEEN   Sperm NONE SEEN    MAU Course  Procedures Fioricet 2 tabs Flexeril 10 mg po  MDM Labs ordered and reviewed. HA resolved. Back pain some improved. No evidence of PTL or UTI. Pain likely MSK and round ligament r/t pregnancy Wet prep neg, cultures pending. Stable for discharge home.  Assessment and Plan   1. [redacted] weeks gestation of pregnancy   2. Supervision of high risk pregnancy in second trimester   3. Monochorionic diamniotic twin pregnancy, antepartum   4. Monochorionic diamniotic twin gestation in second trimester   5. Back pain affecting pregnancy in second trimester   6. Pain of round ligament during pregnancy    Discharge home Rx Fioricet Rx Flexeril Rx maternity support belt Follow up in [place] as scheduled PTL precautions  Allergies as of 06/18/2016      Reactions   Lactose Intolerance (gi) Diarrhea      Medication List    TAKE these medications    butalbital-acetaminophen-caffeine 50-325-40 MG tablet Commonly known as:  FIORICET, ESGIC Take 1-2 tablets by mouth every 6 (six) hours as needed for headache or migraine.   COMFORT FIT MATERNITY SUPP MED Misc 1 Device by Does not apply route daily.   cyclobenzaprine 10 MG tablet Commonly known as:  FLEXERIL Take 1 tablet (10 mg total) by mouth 3 (three) times daily as needed for muscle spasms.   PRE-NATAL FORMULA Tabs Take 1 tablet by mouth daily.   ranitidine 150 MG tablet Commonly known as:  ZANTAC Take 1  tablet (150 mg total) by mouth 2 (two) times daily.   zolpidem 5 MG tablet Commonly known as:  AMBIEN Take 1 tablet (5 mg total) by mouth at bedtime as needed for sleep.      Julianne Handler, CNM 06/18/2016, 6:50 PM

## 2016-06-18 NOTE — MAU Note (Signed)
Pt states she had lower abdominal pain that started last night and has continued today. Pt states she has vaginal discharge with odor. Pt states she was diagnosed with a yeast infection "a while back" but didn't use the cream that was prescribed. Pt states she "keeps" a migraine and has one today.

## 2016-06-18 NOTE — Discharge Instructions (Signed)
Round Ligament Pain Introduction The round ligament is a cord of muscle and tissue that helps to support the uterus. It can become a source of pain during pregnancy if it becomes stretched or twisted as the baby grows. The pain usually begins in the second trimester of pregnancy, and it can come and go until the baby is delivered. It is not a serious problem, and it does not cause harm to the baby. Round ligament pain is usually a short, sharp, and pinching pain, but it can also be a dull, lingering, and aching pain. The pain is felt in the lower side of the abdomen or in the groin. It usually starts deep in the groin and moves up to the outside of the hip area. Pain can occur with:  A sudden change in position.  Rolling over in bed.  Coughing or sneezing.  Physical activity. Follow these instructions at home: Watch your condition for any changes. Take these steps to help with your pain:  When the pain starts, relax. Then try:  Sitting down.  Flexing your knees up to your abdomen.  Lying on your side with one pillow under your abdomen and another pillow between your legs.  Sitting in a warm bath for 15-20 minutes or until the pain goes away.  Take over-the-counter and prescription medicines only as told by your health care provider.  Move slowly when you sit and stand.  Avoid long walks if they cause pain.  Stop or lessen your physical activities if they cause pain. Contact a health care provider if:  Your pain does not go away with treatment.  You feel pain in your back that you did not have before.  Your medicine is not helping. Get help right away if:  You develop a fever or chills.  You develop uterine contractions.  You develop vaginal bleeding.  You develop nausea or vomiting.  You develop diarrhea.  You have pain when you urinate. This information is not intended to replace advice given to you by your health care provider. Make sure you discuss any questions  you have with your health care provider. Document Released: 01/25/2008 Document Revised: 09/23/2015 Document Reviewed: 06/24/2014  2017 Elsevier Back Pain in Pregnancy Introduction Back pain during pregnancy is common. Back pain may be caused by several factors that are related to changes during your pregnancy. Follow these instructions at home: Managing pain, stiffness, and swelling  If directed, apply ice for sudden (acute) back pain.  Put ice in a plastic bag.  Place a towel between your skin and the bag.  Leave the ice on for 20 minutes, 2-3 times per day.  If directed, apply heat to the affected area before you exercise:  Place a towel between your skin and the heat pack or heating pad.  Leave the heat on for 20-30 minutes.  Remove the heat if your skin turns bright red. This is especially important if you are unable to feel pain, heat, or cold. You may have a greater risk of getting burned. Activity  Exercise as told by your health care provider. Exercising is the best way to prevent or manage back pain.  Listen to your body when lifting. If lifting hurts, ask for help or bend your knees. This uses your leg muscles instead of your back muscles.  Squat down when picking up something from the floor. Do not bend over.  Only use bed rest as told by your health care provider. Bed rest should only be used  for the most severe episodes of back pain. Standing, Sitting, and Lying Down  Do not stand in one place for long periods of time.  Use good posture when sitting. Make sure your head rests over your shoulders and is not hanging forward. Use a pillow on your lower back if necessary.  Try sleeping on your side, preferably the left side, with a pillow or two between your legs. If you are sore after a night's rest, your bed may be too soft. A firm mattress may provide more support for your back during pregnancy. General instructions  Do not wear high heels.  Eat a healthy  diet. Try to gain weight within your health care provider's recommendations.  Use a maternity girdle, elastic sling, or back brace as told by your health care provider.  Take over-the-counter and prescription medicines only as told by your health care provider.  Keep all follow-up visits as told by your health care provider. This is important. This includes any visits with any specialists, such as a physical therapist. Contact a health care provider if:  Your back pain interferes with your daily activities.  You have increasing pain in other parts of your body. Get help right away if:  You develop numbness, tingling, weakness, or problems with the use of your arms or legs.  You develop severe back pain that is not controlled with medicine.  You have a sudden change in bowel or bladder control.  You develop shortness of breath, dizziness, or you faint.  You develop nausea, vomiting, or sweating.  You have back pain that is a rhythmic, cramping pain similar to labor pains. Labor pain is usually 1-2 minutes apart, lasts for about 1 minute, and involves a bearing down feeling or pressure in your pelvis.  You have back pain and your water breaks or you have vaginal bleeding.  You have back pain or numbness that travels down your leg.  Your back pain developed after you fell.  You develop pain on one side of your back.  You see blood in your urine.  You develop skin blisters in the area of your back pain. This information is not intended to replace advice given to you by your health care provider. Make sure you discuss any questions you have with your health care provider. Document Released: 07/26/2005 Document Revised: 09/23/2015 Document Reviewed: 12/30/2014  2017 Elsevier

## 2016-06-19 LAB — GC/CHLAMYDIA PROBE AMP (~~LOC~~) NOT AT ARMC
Chlamydia: NEGATIVE
NEISSERIA GONORRHEA: NEGATIVE

## 2016-06-21 ENCOUNTER — Ambulatory Visit (HOSPITAL_COMMUNITY)
Admission: RE | Admit: 2016-06-21 | Discharge: 2016-06-21 | Disposition: A | Discharge status: Home / Self Care | Payer: Medicaid Other | Source: Ambulatory Visit | Attending: Obstetrics & Gynecology | Admitting: Obstetrics & Gynecology

## 2016-06-21 ENCOUNTER — Encounter (HOSPITAL_COMMUNITY): Payer: Self-pay

## 2016-06-21 VITALS — BP 124/85 | HR 91 | Wt 179.2 lb

## 2016-06-21 DIAGNOSIS — Z3A20 20 weeks gestation of pregnancy: Secondary | ICD-10-CM | POA: Insufficient documentation

## 2016-06-21 DIAGNOSIS — O99332 Smoking (tobacco) complicating pregnancy, second trimester: Secondary | ICD-10-CM | POA: Diagnosis not present

## 2016-06-21 DIAGNOSIS — O99322 Drug use complicating pregnancy, second trimester: Secondary | ICD-10-CM | POA: Insufficient documentation

## 2016-06-21 DIAGNOSIS — O09292 Supervision of pregnancy with other poor reproductive or obstetric history, second trimester: Secondary | ICD-10-CM | POA: Diagnosis not present

## 2016-06-21 DIAGNOSIS — O10012 Pre-existing essential hypertension complicating pregnancy, second trimester: Secondary | ICD-10-CM | POA: Diagnosis not present

## 2016-06-21 DIAGNOSIS — O30039 Twin pregnancy, monochorionic/diamniotic, unspecified trimester: Secondary | ICD-10-CM

## 2016-06-21 DIAGNOSIS — O30032 Twin pregnancy, monochorionic/diamniotic, second trimester: Secondary | ICD-10-CM

## 2016-06-28 ENCOUNTER — Ambulatory Visit (HOSPITAL_COMMUNITY)
Admission: RE | Admit: 2016-06-28 | Discharge: 2016-06-28 | Disposition: A | Discharge status: Home / Self Care | Payer: Medicaid Other | Source: Ambulatory Visit | Attending: Obstetrics & Gynecology | Admitting: Obstetrics & Gynecology

## 2016-06-28 ENCOUNTER — Encounter (HOSPITAL_COMMUNITY): Payer: Self-pay

## 2016-07-05 ENCOUNTER — Other Ambulatory Visit: Payer: Self-pay | Admitting: Family Medicine

## 2016-07-05 ENCOUNTER — Ambulatory Visit (HOSPITAL_COMMUNITY): Payer: Medicaid Other

## 2016-07-05 DIAGNOSIS — G4701 Insomnia due to medical condition: Secondary | ICD-10-CM

## 2016-07-06 ENCOUNTER — Ambulatory Visit (HOSPITAL_COMMUNITY)
Admission: RE | Admit: 2016-07-06 | Payer: Medicaid Other | Source: Ambulatory Visit | Attending: Maternal and Fetal Medicine | Admitting: Maternal and Fetal Medicine

## 2016-07-13 ENCOUNTER — Encounter: Payer: Medicaid Other | Admitting: Obstetrics & Gynecology

## 2016-07-17 ENCOUNTER — Encounter: Payer: Medicaid Other | Admitting: Obstetrics & Gynecology

## 2016-07-18 ENCOUNTER — Ambulatory Visit (HOSPITAL_COMMUNITY)
Admission: RE | Admit: 2016-07-18 | Payer: Medicaid Other | Source: Ambulatory Visit | Attending: Maternal and Fetal Medicine | Admitting: Maternal and Fetal Medicine

## 2016-07-21 ENCOUNTER — Encounter (HOSPITAL_COMMUNITY): Payer: Self-pay

## 2016-07-21 ENCOUNTER — Inpatient Hospital Stay (HOSPITAL_COMMUNITY)
Admission: AD | Admit: 2016-07-21 | Discharge: 2016-07-21 | Disposition: A | Discharge status: Home / Self Care | Payer: Medicaid Other | Source: Ambulatory Visit | Attending: Obstetrics & Gynecology | Admitting: Obstetrics & Gynecology

## 2016-07-21 DIAGNOSIS — O30032 Twin pregnancy, monochorionic/diamniotic, second trimester: Secondary | ICD-10-CM | POA: Insufficient documentation

## 2016-07-21 DIAGNOSIS — O26892 Other specified pregnancy related conditions, second trimester: Secondary | ICD-10-CM

## 2016-07-21 DIAGNOSIS — O30039 Twin pregnancy, monochorionic/diamniotic, unspecified trimester: Secondary | ICD-10-CM

## 2016-07-21 DIAGNOSIS — F17210 Nicotine dependence, cigarettes, uncomplicated: Secondary | ICD-10-CM | POA: Diagnosis not present

## 2016-07-21 DIAGNOSIS — O4702 False labor before 37 completed weeks of gestation, second trimester: Secondary | ICD-10-CM | POA: Diagnosis not present

## 2016-07-21 DIAGNOSIS — O99332 Smoking (tobacco) complicating pregnancy, second trimester: Secondary | ICD-10-CM | POA: Insufficient documentation

## 2016-07-21 DIAGNOSIS — Z3A24 24 weeks gestation of pregnancy: Secondary | ICD-10-CM | POA: Insufficient documentation

## 2016-07-21 DIAGNOSIS — R51 Headache: Secondary | ICD-10-CM | POA: Diagnosis present

## 2016-07-21 LAB — URINALYSIS, ROUTINE W REFLEX MICROSCOPIC
BILIRUBIN URINE: NEGATIVE
Glucose, UA: NEGATIVE mg/dL
Hgb urine dipstick: NEGATIVE
Ketones, ur: NEGATIVE mg/dL
LEUKOCYTES UA: NEGATIVE
Nitrite: NEGATIVE
PH: 7 (ref 5.0–8.0)
Protein, ur: NEGATIVE mg/dL
SPECIFIC GRAVITY, URINE: 1.014 (ref 1.005–1.030)

## 2016-07-21 LAB — FETAL FIBRONECTIN: Fetal Fibronectin: NEGATIVE

## 2016-07-21 LAB — WET PREP, GENITAL
CLUE CELLS WET PREP: NONE SEEN
SPERM: NONE SEEN
TRICH WET PREP: NONE SEEN
Yeast Wet Prep HPF POC: NONE SEEN

## 2016-07-21 LAB — POCT FERN TEST: POCT Fern Test: NEGATIVE

## 2016-07-21 LAB — CBC
HEMATOCRIT: 32.3 % — AB (ref 36.0–46.0)
HEMOGLOBIN: 10.5 g/dL — AB (ref 12.0–15.0)
MCH: 28.4 pg (ref 26.0–34.0)
MCHC: 32.5 g/dL (ref 30.0–36.0)
MCV: 87.3 fL (ref 78.0–100.0)
PLATELETS: 239 10*3/uL (ref 150–400)
RBC: 3.7 MIL/uL — AB (ref 3.87–5.11)
RDW: 14.7 % (ref 11.5–15.5)
WBC: 12.4 10*3/uL — AB (ref 4.0–10.5)

## 2016-07-21 MED ORDER — DEXAMETHASONE SODIUM PHOSPHATE 10 MG/ML IJ SOLN
10.0000 mg | Freq: Once | INTRAMUSCULAR | Status: AC
Start: 2016-07-21 — End: 2016-07-21
  Administered 2016-07-21: 10 mg via INTRAVENOUS
  Filled 2016-07-21: qty 1

## 2016-07-21 MED ORDER — COMFORT FIT MATERNITY SUPP SM MISC: 1.0000 [IU] | Freq: Every day | 0 refills | Status: DC | PRN

## 2016-07-21 MED ORDER — SODIUM CHLORIDE 0.9 % IV SOLN
INTRAVENOUS | Status: DC
  Administered 2016-07-21: 19:00:00 via INTRAVENOUS

## 2016-07-21 MED ORDER — METOCLOPRAMIDE HCL 5 MG/ML IJ SOLN
10.0000 mg | Freq: Once | INTRAMUSCULAR | Status: AC
  Administered 2016-07-21: 10 mg via INTRAVENOUS
  Filled 2016-07-21: qty 2

## 2016-07-21 MED ORDER — BUTALBITAL-APAP-CAFFEINE 50-325-40 MG PO TABS: 1.0000 | ORAL_TABLET | Freq: Four times a day (QID) | ORAL | 0 refills | Status: DC | PRN

## 2016-07-21 MED ORDER — DIPHENHYDRAMINE HCL 50 MG/ML IJ SOLN
25.0000 mg | Freq: Once | INTRAMUSCULAR | Status: AC
  Administered 2016-07-21: 25 mg via INTRAVENOUS
  Filled 2016-07-21: qty 1

## 2016-07-21 NOTE — Progress Notes (Signed)
RN discussed with Lawrence,NP difficulty obtaining continuous fetal heart rate tracing. RN x3 into room adjusting monitors constantly. Fetal movement felt x2 by RN frequently.

## 2016-07-21 NOTE — MAU Provider Note (Signed)
History     CSN: [id number]  Arrival date and time: 07/21/16 1725   First Provider Initiated Contact with Patient 07/21/16 1820      Chief Complaint  Patient presents with  . Headache  . Back Pain  . Abdominal Cramping   HPI  Robin Horn is a 31 y.o. [id number] at [redacted]w[redacted]d with mono/di twins who presents with headache & abdominal cramping. Symptoms began several days ago. Reports constant headache that is similar to previous headaches. Rates pain 8/10. Has taken 2 ES tylenol without relief. Movement makes headache worse. Denies photo/phonophobia.  Also reports intermittent lower abdominal pain that radiates to low back and feels like contractions. Feels pain 3-4 times per hour. Has had increase in discharge that look white & clear watery at times. Denies vaginal bleeding. Some nausea, no vomiting or diarrhea. Positive fetal movement.   OB History    Gravida Para Term Preterm AB Living   8 4 4  0 3 4   SAB TAB Ectopic Multiple Live Births   0 3 0 0 4      Past Medical History:  Diagnosis Date  . Abnormal Pap smear of cervix   . Anxiety   . Chlamydia   . Depression   . Gonorrhea   . Herpes genitalia   . Migraines   . Ovarian cyst   . Preeclampsia   . Pregnancy induced hypertension     Past Surgical History:  Procedure Laterality Date  . COLPOSCOPY    . DILATION AND CURETTAGE OF UTERUS    . TONSILLECTOMY      Family History  Problem Relation Age of Onset  . Kidney disease Mother   . Cancer Paternal Grandmother   . Other Neg Hx     Social History  Substance Use Topics  . Smoking status: Light Tobacco Smoker    Packs/day: 0.25    Types: Cigarettes  . Smokeless tobacco: Never Used  . Alcohol use 0.6 oz/week    1 Glasses of wine per week     Comment: Once every 2 months. Nothing since finding out about pregnancy.     Allergies:  Allergies  Allergen Reactions  . Lactose Intolerance (Gi) Diarrhea    Prescriptions Prior to Admission  Medication Sig Dispense  Refill Last Dose  . butalbital-acetaminophen-caffeine (FIORICET, ESGIC) 50-325-40 MG tablet Take 1-2 tablets by mouth every 6 (six) hours as needed for headache or migraine. 30 tablet 0 Taking  . cyclobenzaprine (FLEXERIL) 10 MG tablet Take 1 tablet (10 mg total) by mouth 3 (three) times daily as needed for muscle spasms. 30 tablet 0 Taking  . Elastic Bandages & Supports (COMFORT FIT MATERNITY SUPP MED) MISC 1 Device by Does not apply route daily. 1 each 0 Taking  . HYDROcodone-acetaminophen (NORCO/VICODIN) 5-325 MG tablet take 1 tablet by mouth four times a day if needed for pain  0   . Prenatal Multivit-Min-Fe-FA (PRE-NATAL FORMULA) TABS Take 1 tablet by mouth daily. 30 each 6 Taking  . ranitidine (ZANTAC) 150 MG tablet Take 1 tablet (150 mg total) by mouth 2 (two) times daily. 60 tablet 6 Taking  . zolpidem (AMBIEN) 5 MG tablet take 1 tablet by mouth at bedtime if needed for sleep 30 tablet 0     Review of Systems  Constitutional: Negative.   Eyes: Negative for photophobia and visual disturbance.  Gastrointestinal: Positive for abdominal pain and nausea. Negative for constipation, diarrhea and vomiting.  Genitourinary: Positive for vaginal discharge. Negative for dysuria and vaginal  bleeding.  Musculoskeletal: Positive for back pain.  Neurological: Positive for headaches. Negative for dizziness.   Physical Exam   Blood pressure 133/84, pulse 92, temperature 98.9 F (37.2 C), temperature source Oral, resp. rate 18, height 5\' 3"  (1.6 m), weight 184 lb (83.5 kg), last menstrual period 01/14/2016, SpO2 100 %.  Physical Exam  Nursing note and vitals reviewed. Constitutional: She is oriented to person, place, and time. She appears well-developed and well-nourished. No distress.  HENT:  Head: Normocephalic and atraumatic.  Eyes: Conjunctivae are normal. Right eye exhibits no discharge. Left eye exhibits no discharge. No scleral icterus.  Neck: Normal range of motion.  Cardiovascular: Normal  rate, regular rhythm and normal heart sounds.   No murmur heard. Respiratory: Effort normal and breath sounds normal. No respiratory distress. She has no wheezes.  GI: Soft. Bowel sounds are normal. There is no tenderness.  Genitourinary: No bleeding in the vagina. Vaginal discharge (small amount of white creamy discharge, no pooling) found.  Genitourinary Comments: Cervix closed/thick  Neurological: She is alert and oriented to person, place, and time.  Skin: Skin is warm and dry. She is not diaphoretic.  Psychiatric: She has a normal mood and affect. Her behavior is normal. Judgment and thought content normal.   Fetal Tracing: Baby A Baseline: 150 Variability: moderate Accelerations: 10x10  Decelerations: variable & prolonged (down to 120s)  Baby B Baseline: 155 Variability: moderate Accelerations:10x10 Decelerations: variable  Toco:  2-6 mins, decreased s/p IVF  MAU Course  Procedures Results for orders placed or performed during the hospital encounter of 07/21/16 (from the past 24 hour(s))  Urinalysis, Routine w reflex microscopic     Status: Abnormal   Collection Time: 07/21/16  5:47 PM  Result Value Ref Range   Color, Urine YELLOW YELLOW   APPearance HAZY (A) CLEAR   Specific Gravity, Urine 1.014 1.005 - 1.030   pH 7.0 5.0 - 8.0   Glucose, UA NEGATIVE NEGATIVE mg/dL   Hgb urine dipstick NEGATIVE NEGATIVE   Bilirubin Urine NEGATIVE NEGATIVE   Ketones, ur NEGATIVE NEGATIVE mg/dL   Protein, ur NEGATIVE NEGATIVE mg/dL   Nitrite NEGATIVE NEGATIVE   Leukocytes, UA NEGATIVE NEGATIVE  CBC     Status: Abnormal   Collection Time: 07/21/16  6:40 PM  Result Value Ref Range   WBC 12.4 (H) 4.0 - 10.5 K/uL   RBC 3.70 (L) 3.87 - 5.11 MIL/uL   Hemoglobin 10.5 (L) 12.0 - 15.0 g/dL   HCT 32.3 (L) 36.0 - 46.0 %   MCV 87.3 78.0 - 100.0 fL   MCH 28.4 26.0 - 34.0 pg   MCHC 32.5 30.0 - 36.0 g/dL   RDW 14.7 11.5 - 15.5 %   Platelets 239 150 - 400 K/uL  Wet prep, genital     Status:  Abnormal   Collection Time: 07/21/16  7:50 PM  Result Value Ref Range   Yeast Wet Prep HPF POC NONE SEEN NONE SEEN   Trich, Wet Prep NONE SEEN NONE SEEN   Clue Cells Wet Prep HPF POC NONE SEEN NONE SEEN   WBC, Wet Prep HPF POC MANY (A) NONE SEEN   Sperm NONE SEEN   Fetal fibronectin     Status: None   Collection Time: 07/21/16  7:50 PM  Result Value Ref Range   Fetal Fibronectin NEGATIVE NEGATIVE  POCT fern test     Status: None   Collection Time: 07/21/16  7:55 PM  Result Value Ref Range   POCT Fern Test Negative =  intact amniotic membranes     MDM Fetal tracing appropriate for gestation -- reviewed tracing with Dr. Roselie Awkward. Baby A had decel but reassuring x 30 minutes after.  IV headache cocktail (reglan, decadron, benadryl) -- pt reports resolution of headache No pooling & fern negative FFN negative & cervix closed Elevated BP x 1 that patient & nurse states was taken while IV was being started; otherwise normotensive Reviewed patient with Dr. Roselie Awkward -- discharge home Assessment and Plan  A: 1. Preterm uterine contractions in second trimester, antepartum   2. Monochorionic diamniotic twin pregnancy, antepartum   3. Pregnancy headache in second trimester    P: Discharge home Rx maternity support belt & fioricet (pt was unable to fill previous prescriptions from last month) Discussed reasons to return to MAU Keep f/u with OB  Jorje Guild 07/21/2016, 6:20 PM

## 2016-07-21 NOTE — MAU Note (Signed)
Patient c/o lower abdominal and back pain that started 2 days ago; cramping in nature. Reports migaraine for past 2 days--seeing spots. Tried tylenol with no relief. Denies VB, LOF, or contractions at this time. +FM

## 2016-07-21 NOTE — Discharge Instructions (Signed)
Preterm Labor and Birth Information The normal length of a pregnancy is 39-41 weeks. Preterm labor is when labor starts before 37 completed weeks of pregnancy. What are the risk factors for preterm labor? Preterm labor is more likely to occur in women who:  Have certain infections during pregnancy such as a bladder infection, sexually transmitted infection, or infection inside the uterus (chorioamnionitis).  Have a shorter-than-normal cervix.  Have gone into preterm labor before.  Have had surgery on their cervix.  Are younger than age 36 or older than age 8.  Are African American.  Are pregnant with twins or multiple babies (multiple gestation).  Take street drugs or smoke while pregnant.  Do not gain enough weight while pregnant.  Became pregnant shortly after having been pregnant. What are the symptoms of preterm labor? Symptoms of preterm labor include:  Cramps similar to those that can happen during a menstrual period. The cramps may happen with diarrhea.  Pain in the abdomen or lower back.  Regular uterine contractions that may feel like tightening of the abdomen.  A feeling of increased pressure in the pelvis.  Increased watery or bloody mucus discharge from the vagina.  Water breaking (ruptured amniotic sac). Why is it important to recognize signs of preterm labor? It is important to recognize signs of preterm labor because babies who are born prematurely may not be fully developed. This can put them at an increased risk for:  Long-term (chronic) heart and lung problems.  Difficulty immediately after birth with regulating body systems, including blood sugar, body temperature, heart rate, and breathing rate.  Bleeding in the brain.  Cerebral palsy.  Learning difficulties.  Death. These risks are highest for babies who are born before 16 weeks of pregnancy. How is preterm labor treated? Treatment depends on the length of your pregnancy, your condition, and  the health of your baby. It may involve:  Having a stitch (suture) placed in your cervix to prevent your cervix from opening too early (cerclage).  Taking or being given medicines, such as:  Hormone medicines. These may be given early in pregnancy to help support the pregnancy.  Medicine to stop contractions.  Medicines to help mature the babys lungs. These may be prescribed if the risk of delivery is high.  Medicines to prevent your baby from developing cerebral palsy. If the labor happens before 34 weeks of pregnancy, you may need to stay in the hospital. What should I do if I think I am in preterm labor? If you think that you are going into preterm labor, call your health care provider right away. How can I prevent preterm labor in future pregnancies? To increase your chance of having a full-term pregnancy:  Do not use any tobacco products, such as cigarettes, chewing tobacco, and e-cigarettes. If you need help quitting, ask your health care provider.  Do not use street drugs or medicines that have not been prescribed to you during your pregnancy.  Talk with your health care provider before taking any herbal supplements, even if you have been taking them regularly.  Make sure you gain a healthy amount of weight during your pregnancy.  Watch for infection. If you think that you might have an infection, get it checked right away.  Make sure to tell your health care provider if you have gone into preterm labor before. This information is not intended to replace advice given to you by your health care provider. Make sure you discuss any questions you have with your health  care provider. Document Released: 07/08/2003 Document Revised: 09/28/2015 Document Reviewed: 09/08/2015 Elsevier Interactive Patient Education  2017 [employer].

## 2016-07-24 ENCOUNTER — Encounter: Payer: Self-pay | Admitting: *Deleted

## 2016-07-24 ENCOUNTER — Ambulatory Visit (INDEPENDENT_AMBULATORY_CARE_PROVIDER_SITE_OTHER): Payer: Medicaid Other | Admitting: Family Medicine

## 2016-07-24 VITALS — BP 129/89 | HR 100 | Wt 185.5 lb

## 2016-07-24 DIAGNOSIS — O30039 Twin pregnancy, monochorionic/diamniotic, unspecified trimester: Secondary | ICD-10-CM

## 2016-07-24 DIAGNOSIS — A6009 Herpesviral infection of other urogenital tract: Secondary | ICD-10-CM | POA: Diagnosis not present

## 2016-07-24 DIAGNOSIS — O99320 Drug use complicating pregnancy, unspecified trimester: Secondary | ICD-10-CM

## 2016-07-24 DIAGNOSIS — O30032 Twin pregnancy, monochorionic/diamniotic, second trimester: Secondary | ICD-10-CM | POA: Diagnosis not present

## 2016-07-24 DIAGNOSIS — O99322 Drug use complicating pregnancy, second trimester: Secondary | ICD-10-CM | POA: Diagnosis not present

## 2016-07-24 DIAGNOSIS — O98512 Other viral diseases complicating pregnancy, second trimester: Secondary | ICD-10-CM

## 2016-07-24 DIAGNOSIS — O0992 Supervision of high risk pregnancy, unspecified, second trimester: Secondary | ICD-10-CM

## 2016-07-24 DIAGNOSIS — O98519 Other viral diseases complicating pregnancy, unspecified trimester: Secondary | ICD-10-CM

## 2016-07-24 DIAGNOSIS — B009 Herpesviral infection, unspecified: Secondary | ICD-10-CM

## 2016-07-24 LAB — GC/CHLAMYDIA PROBE AMP (~~LOC~~) NOT AT ARMC
CHLAMYDIA, DNA PROBE: NEGATIVE
NEISSERIA GONORRHEA: NEGATIVE

## 2016-07-24 MED ORDER — VALACYCLOVIR HCL 1 G PO TABS: 1000.0000 mg | ORAL_TABLET | Freq: Two times a day (BID) | ORAL | 3 refills | Status: DC

## 2016-07-24 NOTE — Progress Notes (Signed)
Current hsv outbreak, on 3/25 started valtrex

## 2016-07-24 NOTE — Progress Notes (Signed)
   PRENATAL VISIT NOTE  Subjective:  Robin Horn is a 31 y.o. [id number] at [redacted]w[redacted]d being seen today for ongoing prenatal care.  She is currently monitored for the following issues for this high-risk pregnancy and has Monochorionic diamniotic twin pregnancy, antepartum; Supervision of high-risk pregnancy; Drug use affecting pregnancy, antepartum; Bipolar 1 disorder ([place]); and Insomnia on her problem list.  Patient reports no complaints.  Contractions: Irritability. Vag. Bleeding: None.  Movement: Present. Denies leaking of fluid.   The following portions of the patient's history were reviewed and updated as appropriate: allergies, current medications, past family history, past medical history, past social history, past surgical history and problem list. Problem list updated.  Objective:   Vitals:   07/24/16 1600 07/24/16 1602  BP: 127/90 129/89  Pulse: 100   Weight: 185 lb 8 oz (84.1 kg)     Fetal Status: Fetal Heart Rate (bpm): 158/160 Fundal Height: 32 cm Movement: Present     General:  Alert, oriented and cooperative. Patient is in no acute distress.  Skin: Skin is warm and dry. No rash noted.   Cardiovascular: Normal heart rate noted  Respiratory: Normal respiratory effort, no problems with respiration noted  Abdomen: Soft, gravid, appropriate for gestational age. Pain/Pressure: Present     Pelvic:  Cervical exam deferred        Extremities: Normal range of motion.     Mental Status: Normal mood and affect. Normal behavior. Normal judgment and thought content.   Assessment and Plan:  Pregnancy: [id number] at [redacted]w[redacted]d  1. Supervision of high risk pregnancy in second trimester FHT normal. PP BTL papers signed today.  2. Monochorionic diamniotic twin pregnancy, antepartum F/u US later this week.  3. Drug use affecting pregnancy, antepartum Discussed positive UDS.  4. HSV-2 infection complicating pregnancy, unspecified trimester Has been taking HSV 500mg  BID. Increase to 1000mg   bid x 10 days. Will need suppression   Preterm labor symptoms and general obstetric precautions including but not limited to vaginal bleeding, contractions, leaking of fluid and fetal movement were reviewed in detail with the patient. Please refer to After Visit Summary for other counseling recommendations.  No Follow-up on file.   Truett Mainland, DO

## 2016-07-26 ENCOUNTER — Ambulatory Visit (HOSPITAL_COMMUNITY)
Admission: RE | Admit: 2016-07-26 | Discharge: 2016-07-26 | Disposition: A | Discharge status: Home / Self Care | Payer: Medicaid Other | Source: Ambulatory Visit | Attending: Obstetrics & Gynecology | Admitting: Obstetrics & Gynecology

## 2016-07-26 ENCOUNTER — Other Ambulatory Visit (HOSPITAL_COMMUNITY): Payer: Self-pay | Admitting: Maternal and Fetal Medicine

## 2016-07-26 ENCOUNTER — Encounter (HOSPITAL_COMMUNITY): Payer: Self-pay

## 2016-07-26 DIAGNOSIS — Z3A25 25 weeks gestation of pregnancy: Secondary | ICD-10-CM

## 2016-07-26 DIAGNOSIS — O99332 Smoking (tobacco) complicating pregnancy, second trimester: Secondary | ICD-10-CM

## 2016-07-26 DIAGNOSIS — O99322 Drug use complicating pregnancy, second trimester: Secondary | ICD-10-CM

## 2016-07-26 DIAGNOSIS — O10012 Pre-existing essential hypertension complicating pregnancy, second trimester: Secondary | ICD-10-CM

## 2016-07-26 DIAGNOSIS — O30032 Twin pregnancy, monochorionic/diamniotic, second trimester: Secondary | ICD-10-CM

## 2016-07-26 DIAGNOSIS — O09292 Supervision of pregnancy with other poor reproductive or obstetric history, second trimester: Secondary | ICD-10-CM | POA: Insufficient documentation

## 2016-07-27 ENCOUNTER — Other Ambulatory Visit (HOSPITAL_COMMUNITY): Payer: Self-pay | Admitting: *Deleted

## 2016-07-27 DIAGNOSIS — O30032 Twin pregnancy, monochorionic/diamniotic, second trimester: Secondary | ICD-10-CM

## 2016-08-04 ENCOUNTER — Telehealth: Payer: Self-pay | Admitting: General Practice

## 2016-08-04 NOTE — Telephone Encounter (Signed)
Patient called and left message stating she is 26w pregnant with twins and had her blood pressure checked recently and it was high at 140/90. Patient states she recently started a new job and her feet have started to swell. Patient is concerned about her blood pressure and would like medication sent in. Called patient and a woman answered stating she wasn't in right now. Told her to let Teren know we tried to reach her to return her phone call. She stated that she would.

## 2016-08-09 ENCOUNTER — Ambulatory Visit (HOSPITAL_COMMUNITY)
Admission: RE | Admit: 2016-08-09 | Discharge: 2016-08-09 | Disposition: A | Discharge status: Home / Self Care | Payer: Medicaid Other | Source: Ambulatory Visit | Attending: Obstetrics & Gynecology | Admitting: Obstetrics & Gynecology

## 2016-08-09 ENCOUNTER — Encounter (HOSPITAL_COMMUNITY): Payer: Self-pay

## 2016-08-09 ENCOUNTER — Other Ambulatory Visit (HOSPITAL_COMMUNITY): Payer: Self-pay | Admitting: Maternal and Fetal Medicine

## 2016-08-09 DIAGNOSIS — O99322 Drug use complicating pregnancy, second trimester: Secondary | ICD-10-CM | POA: Diagnosis not present

## 2016-08-09 DIAGNOSIS — O10012 Pre-existing essential hypertension complicating pregnancy, second trimester: Secondary | ICD-10-CM | POA: Diagnosis not present

## 2016-08-09 DIAGNOSIS — O09292 Supervision of pregnancy with other poor reproductive or obstetric history, second trimester: Secondary | ICD-10-CM | POA: Diagnosis not present

## 2016-08-09 DIAGNOSIS — O99332 Smoking (tobacco) complicating pregnancy, second trimester: Secondary | ICD-10-CM | POA: Diagnosis not present

## 2016-08-09 DIAGNOSIS — O30032 Twin pregnancy, monochorionic/diamniotic, second trimester: Secondary | ICD-10-CM | POA: Diagnosis present

## 2016-08-09 DIAGNOSIS — Z3A27 27 weeks gestation of pregnancy: Secondary | ICD-10-CM | POA: Insufficient documentation

## 2016-08-09 DIAGNOSIS — O30039 Twin pregnancy, monochorionic/diamniotic, unspecified trimester: Secondary | ICD-10-CM

## 2016-08-10 ENCOUNTER — Other Ambulatory Visit: Payer: Self-pay | Admitting: Family Medicine

## 2016-08-10 ENCOUNTER — Other Ambulatory Visit (HOSPITAL_COMMUNITY): Payer: Self-pay | Admitting: *Deleted

## 2016-08-10 DIAGNOSIS — O30033 Twin pregnancy, monochorionic/diamniotic, third trimester: Secondary | ICD-10-CM

## 2016-08-10 DIAGNOSIS — G4701 Insomnia due to medical condition: Secondary | ICD-10-CM

## 2016-08-10 NOTE — Telephone Encounter (Signed)
Called patient and she reports elevated blood pressure in MFM yesterday. Asked patient about headaches, blurry vision or dizziness. Patient endorses worsening headaches and seeing spots. Per Dr Hulan Fray, patient should go to MAU for evaluation. Discussed with patient recommendation. Patient verbalized understanding and states she will go later today. Patient had no questions

## 2016-08-11 ENCOUNTER — Encounter (HOSPITAL_COMMUNITY): Payer: Self-pay | Admitting: Certified Nurse Midwife

## 2016-08-11 ENCOUNTER — Inpatient Hospital Stay (HOSPITAL_COMMUNITY)
Admission: AD | Admit: 2016-08-11 | Discharge: 2016-08-11 | Disposition: A | Discharge status: Home / Self Care | Payer: Medicaid Other | Source: Ambulatory Visit | Attending: Obstetrics and Gynecology | Admitting: Obstetrics and Gynecology

## 2016-08-11 DIAGNOSIS — O99342 Other mental disorders complicating pregnancy, second trimester: Secondary | ICD-10-CM | POA: Insufficient documentation

## 2016-08-11 DIAGNOSIS — N898 Other specified noninflammatory disorders of vagina: Secondary | ICD-10-CM | POA: Diagnosis not present

## 2016-08-11 DIAGNOSIS — F419 Anxiety disorder, unspecified: Secondary | ICD-10-CM | POA: Diagnosis not present

## 2016-08-11 DIAGNOSIS — O3482 Maternal care for other abnormalities of pelvic organs, second trimester: Secondary | ICD-10-CM | POA: Diagnosis not present

## 2016-08-11 DIAGNOSIS — R102 Pelvic and perineal pain: Secondary | ICD-10-CM | POA: Diagnosis not present

## 2016-08-11 DIAGNOSIS — F17210 Nicotine dependence, cigarettes, uncomplicated: Secondary | ICD-10-CM | POA: Diagnosis not present

## 2016-08-11 DIAGNOSIS — N949 Unspecified condition associated with female genital organs and menstrual cycle: Secondary | ICD-10-CM

## 2016-08-11 DIAGNOSIS — Z841 Family history of disorders of kidney and ureter: Secondary | ICD-10-CM | POA: Diagnosis not present

## 2016-08-11 DIAGNOSIS — O9989 Other specified diseases and conditions complicating pregnancy, childbirth and the puerperium: Secondary | ICD-10-CM

## 2016-08-11 DIAGNOSIS — G44229 Chronic tension-type headache, not intractable: Secondary | ICD-10-CM | POA: Diagnosis not present

## 2016-08-11 DIAGNOSIS — F329 Major depressive disorder, single episode, unspecified: Secondary | ICD-10-CM | POA: Insufficient documentation

## 2016-08-11 DIAGNOSIS — Z809 Family history of malignant neoplasm, unspecified: Secondary | ICD-10-CM | POA: Diagnosis not present

## 2016-08-11 DIAGNOSIS — O99332 Smoking (tobacco) complicating pregnancy, second trimester: Secondary | ICD-10-CM | POA: Insufficient documentation

## 2016-08-11 DIAGNOSIS — Z888 Allergy status to other drugs, medicaments and biological substances status: Secondary | ICD-10-CM | POA: Diagnosis not present

## 2016-08-11 DIAGNOSIS — O30039 Twin pregnancy, monochorionic/diamniotic, unspecified trimester: Secondary | ICD-10-CM

## 2016-08-11 DIAGNOSIS — Z9889 Other specified postprocedural states: Secondary | ICD-10-CM | POA: Diagnosis not present

## 2016-08-11 DIAGNOSIS — O0992 Supervision of high risk pregnancy, unspecified, second trimester: Secondary | ICD-10-CM

## 2016-08-11 DIAGNOSIS — O30032 Twin pregnancy, monochorionic/diamniotic, second trimester: Secondary | ICD-10-CM | POA: Diagnosis present

## 2016-08-11 DIAGNOSIS — R51 Headache: Secondary | ICD-10-CM | POA: Diagnosis not present

## 2016-08-11 DIAGNOSIS — O26892 Other specified pregnancy related conditions, second trimester: Secondary | ICD-10-CM | POA: Diagnosis not present

## 2016-08-11 DIAGNOSIS — Z79899 Other long term (current) drug therapy: Secondary | ICD-10-CM | POA: Diagnosis not present

## 2016-08-11 DIAGNOSIS — Z3A27 27 weeks gestation of pregnancy: Secondary | ICD-10-CM | POA: Diagnosis present

## 2016-08-11 DIAGNOSIS — N83209 Unspecified ovarian cyst, unspecified side: Secondary | ICD-10-CM | POA: Insufficient documentation

## 2016-08-11 DIAGNOSIS — O30002 Twin pregnancy, unspecified number of placenta and unspecified number of amniotic sacs, second trimester: Secondary | ICD-10-CM

## 2016-08-11 LAB — WET PREP, GENITAL
Clue Cells Wet Prep HPF POC: NONE SEEN
Sperm: NONE SEEN
Trich, Wet Prep: NONE SEEN
YEAST WET PREP: NONE SEEN

## 2016-08-11 LAB — URINALYSIS, ROUTINE W REFLEX MICROSCOPIC
BILIRUBIN URINE: NEGATIVE
Glucose, UA: NEGATIVE mg/dL
HGB URINE DIPSTICK: NEGATIVE
KETONES UR: NEGATIVE mg/dL
Leukocytes, UA: NEGATIVE
NITRITE: NEGATIVE
PROTEIN: NEGATIVE mg/dL
Specific Gravity, Urine: 1.014 (ref 1.005–1.030)
pH: 7 (ref 5.0–8.0)

## 2016-08-11 MED ORDER — CYCLOBENZAPRINE HCL 10 MG PO TABS
10.0000 mg | ORAL_TABLET | Freq: Once | ORAL | Status: AC
  Administered 2016-08-11: 10 mg via ORAL
  Filled 2016-08-11: qty 1

## 2016-08-11 MED ORDER — CYCLOBENZAPRINE HCL 10 MG PO TABS: 10.0000 mg | ORAL_TABLET | Freq: Three times a day (TID) | ORAL | 0 refills | Status: DC | PRN

## 2016-08-11 NOTE — Discharge Instructions (Signed)
Tension Headache A tension headache is pain, pressure, or aching that is felt over the front and sides of your head. These headaches can last from 30 minutes to several days. Follow these instructions at home: Managing pain   Take over-the-counter and prescription medicines only as told by your doctor.  Lie down in a dark, quiet room when you have a headache.  If directed, apply ice to your head and neck area:  Put ice in a plastic bag.  Place a towel between your skin and the bag.  Leave the ice on for 20 minutes, 2-3 times per day.  Use a heating pad or a hot shower to apply heat to your head and neck area as told by your doctor. Eating and drinking   Eat meals on a regular schedule.  Do not drink a lot of alcohol.  Do not use a lot of caffeine, or stop using caffeine. General instructions   Keep all follow-up visits as told by your doctor. This is important.  Keep a journal to find out if certain things bring on headaches. For example, write down:  What you eat and drink.  How much sleep you get.  Any change to your diet or medicines.  Try getting a massage, or doing other things that help you to relax.  Lessen stress.  Sit up straight. Do not tighten (tense) your muscles.  Do not use tobacco products. This includes cigarettes, chewing tobacco, or e-cigarettes. If you need help quitting, ask your doctor.  Exercise regularly as told by your doctor.  Get enough sleep. This may mean 7-9 hours of sleep. Contact a doctor if:  Your symptoms are not helped by medicine.  You have a headache that feels different from your usual headache.  You feel sick to your stomach (nauseous) or you throw up (vomit).  You have a fever. Get help right away if:  Your headache becomes very bad.  You keep throwing up.  You have a stiff neck.  You have trouble seeing.  You have trouble speaking.  You have pain in your eye or ear.  Your muscles are weak or you lose muscle  control.  You lose your balance or you have trouble walking.  You feel like you will pass out (faint) or you pass out.  You have confusion. This information is not intended to replace advice given to you by your health care provider. Make sure you discuss any questions you have with your health care provider. Document Released: 07/12/2009 Document Revised: 12/16/2015 Document Reviewed: 08/10/2014 Elsevier Interactive Patient Education  2017 [employer]. Round Ligament Pain The round ligament is a cord of muscle and tissue that helps to support the uterus. It can become a source of pain during pregnancy if it becomes stretched or twisted as the baby grows. The pain usually begins in the second trimester of pregnancy, and it can come and go until the baby is delivered. It is not a serious problem, and it does not cause harm to the baby. Round ligament pain is usually a short, sharp, and pinching pain, but it can also be a dull, lingering, and aching pain. The pain is felt in the lower side of the abdomen or in the groin. It usually starts deep in the groin and moves up to the outside of the hip area. Pain can occur with:  A sudden change in position.  Rolling over in bed.  Coughing or sneezing.  Physical activity. Follow these instructions at home:  Watch your condition for any changes. Take these steps to help with your pain:  When the pain starts, relax. Then try:  Sitting down.  Flexing your knees up to your abdomen.  Lying on your side with one pillow under your abdomen and another pillow between your legs.  Sitting in a warm bath for 15-20 minutes or until the pain goes away.  Take over-the-counter and prescription medicines only as told by your health care provider.  Move slowly when you sit and stand.  Avoid long walks if they cause pain.  Stop or lessen your physical activities if they cause pain. Contact a health care provider if:  Your pain does not go away with  treatment.  You feel pain in your back that you did not have before.  Your medicine is not helping. Get help right away if:  You develop a fever or chills.  You develop uterine contractions.  You develop vaginal bleeding.  You develop nausea or vomiting.  You develop diarrhea.  You have pain when you urinate. This information is not intended to replace advice given to you by your health care provider. Make sure you discuss any questions you have with your health care provider. Document Released: 01/25/2008 Document Revised: 09/23/2015 Document Reviewed: 06/24/2014 Elsevier Interactive Patient Education  2017 [employer].

## 2016-08-11 NOTE — MAU Provider Note (Signed)
History     CSN: [id number]  Arrival date and time: 08/11/16 1947   First Provider Initiated Contact with Patient 08/11/16 2040      Chief Complaint  Patient presents with  . Headache  . Visual Field Change  . pelvic pressure  . not sleeping   HPI [name] is a 31 y.o. [phone] at [redacted]w[redacted]d with mono-di twins who presents to MAU today with complaint of headache, floaters, pelvic pressure and possible LOF. The patient states that she has felt that her underwear always wet since starting her new job at [employer] 3 days ago. She states that this is similar to what she has noted in the past as well. It is white in color. She has been on her feet 7-10 hours per day at this job. She feels pelvic pressure when on her feet. She states occasional contractions only and denies vaginal bleeding. She states continued headaches often. She has had this off and on for months. She also states that she has floaters when headaches are really bad, this is also unchanged from previously. She has headache now rated at 8/10. She took Tylenol without relief around 1700 today. She has tried Fioricet in the past, but it makes her sick. She had Flexeril that helped in the past, but ran out.   OB History    Gravida Para Term Preterm AB Living   8 4 4  0 3 4   SAB TAB Ectopic Multiple Live Births   0 3 0 0 4      Past Medical History:  Diagnosis Date  . Abnormal Pap smear of cervix   . Anxiety   . Chlamydia   . Depression   . Gonorrhea   . Herpes genitalia   . Migraines   . Ovarian cyst   . Preeclampsia   . Pregnancy induced hypertension     Past Surgical History:  Procedure Laterality Date  . COLPOSCOPY    . DILATION AND CURETTAGE OF UTERUS    . TONSILLECTOMY      Family History  Problem Relation Age of Onset  . Kidney disease Mother   . Cancer Paternal Grandmother   . Other Neg Hx     Social History  Substance Use Topics  . Smoking status: Light Tobacco Smoker    Packs/day:  0.25    Types: Cigarettes  . Smokeless tobacco: Never Used  . Alcohol use 0.6 oz/week    1 Glasses of wine per week     Comment: Once every 2 months. Nothing since finding out about pregnancy.     Allergies:  Allergies  Allergen Reactions  . Lactose Intolerance (Gi) Diarrhea    Prescriptions Prior to Admission  Medication Sig Dispense Refill Last Dose  . Prenatal Multivit-Min-Fe-FA (PRE-NATAL FORMULA) TABS Take 1 tablet by mouth daily. 30 each 6 08/11/2016 at Unknown time  . acetaminophen (TYLENOL) 500 MG tablet Take 1,000 mg by mouth every 6 (six) hours as needed for moderate pain.   Taking  . butalbital-acetaminophen-caffeine (FIORICET, ESGIC) 50-325-40 MG tablet Take 1-2 tablets by mouth every 6 (six) hours as needed for headache. 20 tablet 0 Taking  . Elastic Bandages & Supports (COMFORT FIT MATERNITY SUPP SM) MISC 1 Units by Does not apply route daily as needed. 1 each 0 Taking  . ranitidine (ZANTAC) 150 MG tablet Take 1 tablet (150 mg total) by mouth 2 (two) times daily. (Patient not taking: Reported on 07/26/2016) 60 tablet 6 Not Taking  .  valACYclovir (VALTREX) 1000 MG tablet Take 1 tablet (1,000 mg total) by mouth 2 (two) times daily. Take for ten days. 20 tablet 3 Taking  . zolpidem (AMBIEN) 5 MG tablet take 1 tablet by mouth at bedtime if needed 30 tablet 0   . [DISCONTINUED] cyclobenzaprine (FLEXERIL) 10 MG tablet Take 1 tablet (10 mg total) by mouth 3 (three) times daily as needed for muscle spasms. 30 tablet 0 Taking    Review of Systems  Constitutional: Negative for fever.  Gastrointestinal: Negative for abdominal pain, constipation, diarrhea, nausea and vomiting.  Genitourinary: Positive for vaginal discharge. Negative for dysuria, frequency, urgency and vaginal bleeding.   Physical Exam   Blood pressure 124/82, pulse 100, temperature 98.7 F (37.1 C), temperature source Oral, resp. rate 18, height 5\' 3"  (1.6 m), weight 191 lb (86.6 kg), last menstrual period  01/14/2016.  Physical Exam  Nursing note and vitals reviewed. Constitutional: She is oriented to person, place, and time. She appears well-developed and well-nourished. No distress.  HENT:  Head: Normocephalic and atraumatic.  Cardiovascular: Normal rate.   Respiratory: Effort normal.  GI: Soft. She exhibits no distension. There is no tenderness.  Genitourinary: Uterus is enlarged (gravid). Cervix exhibits no motion tenderness and no friability. No bleeding in the vagina. Vaginal discharge (small amount of thick, white discharge. No pooling. ) found.  Neurological: She is alert and oriented to person, place, and time.  Skin: Skin is warm and dry. No erythema.  Psychiatric: She has a normal mood and affect.  Dilation: Closed Effacement (%): Thick Cervical Position: Posterior Station: Ballotable Exam by:: Kerry Hough, PA-C   Results for orders placed or performed during the hospital encounter of 08/11/16 (from the past 24 hour(s))  Urinalysis, Routine w reflex microscopic     Status: Abnormal   Collection Time: 08/11/16  8:10 PM  Result Value Ref Range   Color, Urine YELLOW YELLOW   APPearance HAZY (A) CLEAR   Specific Gravity, Urine 1.014 1.005 - 1.030   pH 7.0 5.0 - 8.0   Glucose, UA NEGATIVE NEGATIVE mg/dL   Hgb urine dipstick NEGATIVE NEGATIVE   Bilirubin Urine NEGATIVE NEGATIVE   Ketones, ur NEGATIVE NEGATIVE mg/dL   Protein, ur NEGATIVE NEGATIVE mg/dL   Nitrite NEGATIVE NEGATIVE   Leukocytes, UA NEGATIVE NEGATIVE  Wet prep, genital     Status: Abnormal   Collection Time: 08/11/16  8:51 PM  Result Value Ref Range   Yeast Wet Prep HPF POC NONE SEEN NONE SEEN   Trich, Wet Prep NONE SEEN NONE SEEN   Clue Cells Wet Prep HPF POC NONE SEEN NONE SEEN   WBC, Wet Prep HPF POC MODERATE (A) NONE SEEN   Sperm NONE SEEN    Fetal Monitoring: Baseline A: 140 bpm Variability: moderate Accelerations: 10 x 10 Decelerations: none Baseline B: 150 bpm Variability:  moderate Accelerations: 10 x 10 Decelerations: none Contractions: none   MAU Course  Procedures None  MDM UA and wet prep today  Fern - negative Flexeril given for headache- patient reports significant improvement Assessment and Plan  A: Mono-di Twins at [redacted]w[redacted]d Round ligament pain  Vaginal discharge in pregnancy, second trimester  Headache, tension type   P: Discharge home Rx for Flexeril given to patient  Preterm labor precautions discussed Advised to use maternity belt as much as possible  Patient advised to follow-up with [facility] as scheduled next week for routine prenatal care Patient may return to MAU as needed or if her condition were to change or  worsen   Luvenia Redden, PA-C  08/11/2016, 9:46 PM

## 2016-08-11 NOTE — MAU Note (Addendum)
I keep headaches. Last time I came was given Fioricet for h/a and is not helping. Tylenol not helping. Not sleeping and having bad dreams. Seeing "dots". Hear heartbeat in R ear. Pelvic pressure esp when walking. Having clear/yellow watery d/c for 3 days since starting new job at [employer].

## 2016-08-14 ENCOUNTER — Ambulatory Visit (INDEPENDENT_AMBULATORY_CARE_PROVIDER_SITE_OTHER): Payer: Medicaid Other | Admitting: Obstetrics and Gynecology

## 2016-08-14 VITALS — BP 137/84 | HR 102 | Wt 191.8 lb

## 2016-08-14 DIAGNOSIS — O0993 Supervision of high risk pregnancy, unspecified, third trimester: Secondary | ICD-10-CM

## 2016-08-14 DIAGNOSIS — O30039 Twin pregnancy, monochorionic/diamniotic, unspecified trimester: Secondary | ICD-10-CM

## 2016-08-14 DIAGNOSIS — Z3009 Encounter for other general counseling and advice on contraception: Secondary | ICD-10-CM

## 2016-08-14 NOTE — Progress Notes (Addendum)
Subjective:  Robin Horn is a 31 y.o. [id number] at [redacted]w[redacted]d being seen today for ongoing prenatal care.  She is currently monitored for the following issues for this high-risk pregnancy and has Monochorionic diamniotic twin pregnancy, antepartum; Supervision of high-risk pregnancy; Drug use affecting pregnancy, antepartum; Bipolar 1 disorder ([place]); and Insomnia on her problem list.  Patient reports vaginal pressure when walking.  Contractions: Irregular. Vag. Bleeding: None.  Movement: Present. Denies leaking of fluid.   The following portions of the patient's history were reviewed and updated as appropriate: allergies, current medications, past family history, past medical history, past social history, past surgical history and problem list. Problem list updated.  Objective:   Vitals:   08/14/16 1632  BP: 137/84  Pulse: (!) 102  Weight: 191 lb 12.8 oz (87 kg)    Fetal Status: Fetal Heart Rate (bpm): 155/161   Movement: Present     General:  Alert, oriented and cooperative. Patient is in no acute distress.  Skin: Skin is warm and dry. No rash noted.   Cardiovascular: Normal heart rate noted  Respiratory: Normal respiratory effort, no problems with respiration noted  Abdomen: Soft, gravid, appropriate for gestational age. Pain/Pressure: Present     Pelvic:  Cervical exam performed        Extremities: Normal range of motion.  Edema: None  Mental Status: Normal mood and affect. Normal behavior. Normal judgment and thought content.   Urinalysis:      Assessment and Plan:  Pregnancy: [id number] at [redacted]w[redacted]d  1. Supervision of high risk pregnancy in third trimester Needs to schedule glucola  2. Monochorionic diamniotic twin pregnancy, antepartum Stable U/S for growths and Doppler studies scheduled  3. Unwanted Fertility  BTL papers signed 07/24/16  Preterm labor symptoms and general obstetric precautions including but not limited to vaginal bleeding, contractions, leaking of fluid and  fetal movement were reviewed in detail with the patient. Please refer to After Visit Summary for other counseling recommendations.  Return in about 2 weeks (around 08/28/2016) for OB visit.   Chancy Milroy, MD

## 2016-08-16 ENCOUNTER — Ambulatory Visit (HOSPITAL_COMMUNITY)
Admission: RE | Admit: 2016-08-16 | Discharge: 2016-08-16 | Disposition: A | Discharge status: Home / Self Care | Payer: Medicaid Other | Source: Ambulatory Visit | Attending: Obstetrics & Gynecology | Admitting: Obstetrics & Gynecology

## 2016-08-16 ENCOUNTER — Encounter (HOSPITAL_COMMUNITY): Payer: Self-pay

## 2016-08-16 ENCOUNTER — Other Ambulatory Visit (HOSPITAL_COMMUNITY): Payer: Self-pay | Admitting: Maternal and Fetal Medicine

## 2016-08-16 DIAGNOSIS — O30033 Twin pregnancy, monochorionic/diamniotic, third trimester: Secondary | ICD-10-CM | POA: Diagnosis not present

## 2016-08-16 DIAGNOSIS — Z3A28 28 weeks gestation of pregnancy: Secondary | ICD-10-CM

## 2016-08-16 DIAGNOSIS — O365932 Maternal care for other known or suspected poor fetal growth, third trimester, fetus 2: Secondary | ICD-10-CM

## 2016-08-16 DIAGNOSIS — O10013 Pre-existing essential hypertension complicating pregnancy, third trimester: Secondary | ICD-10-CM | POA: Insufficient documentation

## 2016-08-16 DIAGNOSIS — O09293 Supervision of pregnancy with other poor reproductive or obstetric history, third trimester: Secondary | ICD-10-CM | POA: Insufficient documentation

## 2016-08-16 DIAGNOSIS — O99333 Smoking (tobacco) complicating pregnancy, third trimester: Secondary | ICD-10-CM | POA: Diagnosis not present

## 2016-08-23 ENCOUNTER — Ambulatory Visit (HOSPITAL_COMMUNITY): Admission: RE | Admit: 2016-08-23 | Payer: Medicaid Other | Source: Ambulatory Visit

## 2016-08-29 ENCOUNTER — Inpatient Hospital Stay (HOSPITAL_COMMUNITY)
Admission: AD | Admit: 2016-08-29 | Discharge: 2016-08-30 | DRG: 781 | Disposition: A | Discharge status: Home / Self Care | Payer: Medicaid Other | Source: Ambulatory Visit | Attending: Obstetrics and Gynecology | Admitting: Obstetrics and Gynecology

## 2016-08-29 ENCOUNTER — Encounter (HOSPITAL_COMMUNITY): Payer: Self-pay | Admitting: *Deleted

## 2016-08-29 DIAGNOSIS — O365932 Maternal care for other known or suspected poor fetal growth, third trimester, fetus 2: Secondary | ICD-10-CM

## 2016-08-29 DIAGNOSIS — O133 Gestational [pregnancy-induced] hypertension without significant proteinuria, third trimester: Principal | ICD-10-CM | POA: Diagnosis present

## 2016-08-29 DIAGNOSIS — F419 Anxiety disorder, unspecified: Secondary | ICD-10-CM | POA: Diagnosis present

## 2016-08-29 DIAGNOSIS — F1410 Cocaine abuse, uncomplicated: Secondary | ICD-10-CM | POA: Diagnosis present

## 2016-08-29 DIAGNOSIS — Z3A30 30 weeks gestation of pregnancy: Secondary | ICD-10-CM | POA: Diagnosis not present

## 2016-08-29 DIAGNOSIS — O99333 Smoking (tobacco) complicating pregnancy, third trimester: Secondary | ICD-10-CM | POA: Diagnosis present

## 2016-08-29 DIAGNOSIS — F329 Major depressive disorder, single episode, unspecified: Secondary | ICD-10-CM | POA: Diagnosis present

## 2016-08-29 DIAGNOSIS — O99343 Other mental disorders complicating pregnancy, third trimester: Secondary | ICD-10-CM | POA: Diagnosis present

## 2016-08-29 DIAGNOSIS — O99323 Drug use complicating pregnancy, third trimester: Secondary | ICD-10-CM | POA: Diagnosis present

## 2016-08-29 DIAGNOSIS — O139 Gestational [pregnancy-induced] hypertension without significant proteinuria, unspecified trimester: Secondary | ICD-10-CM | POA: Diagnosis present

## 2016-08-29 DIAGNOSIS — F17210 Nicotine dependence, cigarettes, uncomplicated: Secondary | ICD-10-CM | POA: Diagnosis present

## 2016-08-29 DIAGNOSIS — R51 Headache: Secondary | ICD-10-CM | POA: Diagnosis present

## 2016-08-29 DIAGNOSIS — O30033 Twin pregnancy, monochorionic/diamniotic, third trimester: Secondary | ICD-10-CM | POA: Diagnosis present

## 2016-08-29 LAB — RAPID URINE DRUG SCREEN, HOSP PERFORMED
AMPHETAMINES: NOT DETECTED
BENZODIAZEPINES: NOT DETECTED
Barbiturates: POSITIVE — AB
COCAINE: NOT DETECTED
Opiates: NOT DETECTED
Tetrahydrocannabinol: NOT DETECTED

## 2016-08-29 LAB — CBC
HEMATOCRIT: 34.9 % — AB (ref 36.0–46.0)
Hemoglobin: 11.3 g/dL — ABNORMAL LOW (ref 12.0–15.0)
MCH: 28.8 pg (ref 26.0–34.0)
MCHC: 32.4 g/dL (ref 30.0–36.0)
MCV: 89 fL (ref 78.0–100.0)
PLATELETS: 195 10*3/uL (ref 150–400)
RBC: 3.92 MIL/uL (ref 3.87–5.11)
RDW: 15.6 % — ABNORMAL HIGH (ref 11.5–15.5)
WBC: 9.6 10*3/uL (ref 4.0–10.5)

## 2016-08-29 LAB — COMPREHENSIVE METABOLIC PANEL
ALBUMIN: 3.1 g/dL — AB (ref 3.5–5.0)
ALK PHOS: 153 U/L — AB (ref 38–126)
ALT: 16 U/L (ref 14–54)
ANION GAP: 9 (ref 5–15)
AST: 23 U/L (ref 15–41)
BUN: 6 mg/dL (ref 6–20)
CHLORIDE: 104 mmol/L (ref 101–111)
CO2: 23 mmol/L (ref 22–32)
Calcium: 9 mg/dL (ref 8.9–10.3)
Creatinine, Ser: 0.6 mg/dL (ref 0.44–1.00)
GFR calc non Af Amer: >60 mL/min (ref >60)
GLUCOSE: 92 mg/dL (ref 65–99)
Potassium: 3.5 mmol/L (ref 3.5–5.1)
SODIUM: 136 mmol/L (ref 135–145)
Total Bilirubin: 0.2 mg/dL — ABNORMAL LOW (ref 0.3–1.2)
Total Protein: 6.6 g/dL (ref 6.5–8.1)

## 2016-08-29 LAB — PROTEIN / CREATININE RATIO, URINE
Creatinine, Urine: 108 mg/dL
PROTEIN CREATININE RATIO: 0.13 mg/mg{creat} (ref 0.00–0.15)
TOTAL PROTEIN, URINE: 14 mg/dL

## 2016-08-29 LAB — URINALYSIS, ROUTINE W REFLEX MICROSCOPIC
Bilirubin Urine: NEGATIVE
Glucose, UA: NEGATIVE mg/dL
HGB URINE DIPSTICK: NEGATIVE
Ketones, ur: NEGATIVE mg/dL
Leukocytes, UA: NEGATIVE
NITRITE: NEGATIVE
PH: 5 (ref 5.0–8.0)
Protein, ur: NEGATIVE mg/dL
SPECIFIC GRAVITY, URINE: 1.012 (ref 1.005–1.030)

## 2016-08-29 LAB — TYPE AND SCREEN
ABO/RH(D): AB POS
ANTIBODY SCREEN: NEGATIVE

## 2016-08-29 MED ORDER — PRENATAL MULTIVITAMIN CH
1.0000 | ORAL_TABLET | Freq: Every day | ORAL | Status: DC
  Administered 2016-08-30: 1 via ORAL
  Filled 2016-08-29 (×2): qty 1

## 2016-08-29 MED ORDER — ACETAMINOPHEN 325 MG PO TABS
650.0000 mg | ORAL_TABLET | ORAL | Status: DC | PRN
  Administered 2016-08-29 – 2016-08-30 (×2): 650 mg via ORAL
  Filled 2016-08-29 (×2): qty 2

## 2016-08-29 MED ORDER — HYDRALAZINE HCL 20 MG/ML IJ SOLN: 10.0000 mg | Freq: Once | INTRAMUSCULAR | Status: DC | PRN

## 2016-08-29 MED ORDER — CALCIUM CARBONATE ANTACID 500 MG PO CHEW: 2.0000 | CHEWABLE_TABLET | ORAL | Status: DC | PRN

## 2016-08-29 MED ORDER — LABETALOL HCL 5 MG/ML IV SOLN: 20.0000 mg | INTRAVENOUS | Status: DC | PRN

## 2016-08-29 MED ORDER — BUTALBITAL-APAP-CAFFEINE 50-325-40 MG PO TABS
2.0000 | ORAL_TABLET | Freq: Four times a day (QID) | ORAL | Status: DC | PRN
  Administered 2016-08-30: 2 via ORAL
  Filled 2016-08-29: qty 2

## 2016-08-29 MED ORDER — BETAMETHASONE SOD PHOS & ACET 6 (3-3) MG/ML IJ SUSP
12.0000 mg | INTRAMUSCULAR | Status: AC
  Administered 2016-08-29 – 2016-08-30 (×2): 12 mg via INTRAMUSCULAR
  Filled 2016-08-29 (×2): qty 2

## 2016-08-29 MED ORDER — ZOLPIDEM TARTRATE 5 MG PO TABS
5.0000 mg | ORAL_TABLET | Freq: Every evening | ORAL | Status: DC | PRN
Start: 2016-08-29 — End: 2016-08-30
  Administered 2016-08-30: 5 mg via ORAL
  Filled 2016-08-29: qty 1

## 2016-08-29 MED ORDER — DOCUSATE SODIUM 100 MG PO CAPS
100.0000 mg | ORAL_CAPSULE | Freq: Every day | ORAL | Status: DC
  Administered 2016-08-30: 100 mg via ORAL
  Filled 2016-08-29 (×2): qty 1

## 2016-08-29 NOTE — H&P (Signed)
Robin Horn is a 31 y.o. female presenting for headache, swelling and visual changes. Has been followed closely for Mono/Di Twins.  Has had some intermittent hypertension in clinic visits and states has had a couple of high BPs between pregnancies.  Other history is remarkable for cocaine use.  Last tox screen was in December.   RN note: PT SAYS SHE HAS SWELLING IN FEET/ HANDS - STARTED IN April.     PNC- WITH [facility]-  BP HAS BEEN  HIGH .    HEADACHE STARTED  Sunday -    TOOK TYLENOL AT 330PM-   2  XS   TABS .        VISION - SEES  DOTS-    WHEN SHE  GETS UP.       HAS TWINS.  -      Electronically signed by Gareth Eagle, RN at 08/29/2016 7:33 PM     . OB History    Gravida Para Term Preterm AB Living   8 4 4  0 3 4   SAB TAB Ectopic Multiple Live Births   0 3 0 0 4     Past Medical History:  Diagnosis Date  . Abnormal Pap smear of cervix   . Anxiety   . Chlamydia   . Depression   . Gonorrhea   . Herpes genitalia   . Migraines   . Ovarian cyst   . Preeclampsia   . Pregnancy induced hypertension    Past Surgical History:  Procedure Laterality Date  . COLPOSCOPY    . DILATION AND CURETTAGE OF UTERUS    . TONSILLECTOMY     Family History: family history includes Cancer in her paternal grandmother; Kidney disease in her mother. Social History:  reports that she has been smoking Cigarettes.  She has been smoking about 0.25 packs per day. She has never used smokeless tobacco. She reports that she drinks about 0.6 oz of alcohol per week . She reports that she does not use drugs.     Maternal Diabetes: No Genetic Screening: Normal Maternal Ultrasounds/Referrals: Abnormal:  Findings:   Other:  22% discordancy Fetal Ultrasounds or other Referrals:  Referred to Materal Fetal Medicine  Maternal Substance Abuse:  Yes:  Type: Cocaine Significant Maternal Medications:  None Significant Maternal Lab Results:  None Other Comments:  See chart for lab values, normal, elevated  BPs  Review of Systems  Constitutional: Positive for malaise/fatigue. Negative for chills and fever.  Eyes: Positive for blurred vision. Negative for double vision and photophobia.  Respiratory: Negative for shortness of breath.   Cardiovascular: Positive for leg swelling. Negative for chest pain.  Gastrointestinal: Negative for abdominal pain, constipation, diarrhea, nausea and vomiting.  Musculoskeletal: Negative for back pain.  Neurological: Positive for headaches. Negative for dizziness, sensory change, focal weakness and seizures.   Maternal Medical History:  Reason for admission: Nausea. Swelling, headaches, visual changes  Contractions: Frequency: irregular.   Perceived severity is mild.    Fetal activity: Perceived fetal activity is normal.   Last perceived fetal movement was within the past hour.    Prenatal complications: PIH.   No bleeding, placental abnormality, pre-eclampsia or preterm labor.   Mono/Di Twins, cocaine use, chlamydia  Prenatal Complications - Diabetes: none.      Blood pressure (!) 150/98, pulse 96, temperature 98.6 F (37 C), temperature source Oral, resp. rate 18, height 5\' 3"  (1.6 m), weight 196 lb 4 oz (89 kg), last menstrual period 01/14/2016, SpO2 99 %.  Maternal Exam:  Uterine Assessment: Contraction strength is mild.  Contraction frequency is irregular.   Abdomen: Patient reports no abdominal tenderness. Fetal presentation: no presenting part  Introitus: Amniotic fluid character: not assessed.     Fetal Exam Fetal Monitor Review: Mode: ultrasound.   Baseline rate: 145 both twins.  Variability: moderate (6-25 bpm).   Pattern: accelerations present and no decelerations.   Each twin FHR pattern looks reassuring but not reactive by criteria, and it is difficult to trace them together due to close proximity  Fetal State Assessment: Category II - tracings are indeterminate. Difficult to trace twins independently  Physical Exam   Constitutional: She is oriented to person, place, and time. She appears well-developed and well-nourished. No distress.  HENT:  Head: Normocephalic.  Neck: Normal range of motion. Neck supple.  Cardiovascular: Normal rate, regular rhythm and normal heart sounds.  Exam reveals no gallop and no friction rub.   No murmur heard. Respiratory: Effort normal and breath sounds normal. No respiratory distress. She has no wheezes. She has no rales. She exhibits no tenderness.  GI: Soft. She exhibits no distension. There is no tenderness. There is no rebound and no guarding.  Musculoskeletal: She exhibits edema (Trace).  Neurological: She is alert and oriented to person, place, and time. She displays normal reflexes. She exhibits normal muscle tone.  Skin: Skin is warm and dry.  Psychiatric: She has a normal mood and affect.    Prenatal labs: ABO, Rh: AB/POS/-- (12/26 1406) Antibody: NEG (12/26 1406) Rubella: 0.97 (12/26 1406) RPR: NON REAC (12/26 1406)  HBsAg: NEGATIVE (12/26 1406)  HIV: NONREACTIVE (12/26 1406)  GBS:     Assessment/Plan: Twin pregnancy, Mono/Di Gestational Hypertension, rule out preeclampsia   Admit to Antenatal per order Dr Elly Modena Routine orders 24 hour urine Has growth [place] ordered for tomorrow Betamethasone series   Hansel Feinstein 08/29/2016, 9:21 PM

## 2016-08-29 NOTE — Progress Notes (Signed)
RN at bedside adjusting/searching with cardiox2. CNM in. Bedside u/s done by CNM with visualization of FHRx2. Cardio reappliedx2.

## 2016-08-29 NOTE — MAU Note (Signed)
PT SAYS SHE HAS SWELLING IN FEET/ HANDS - STARTED IN April.     PNC- WITH [facility]-  BP HAS BEEN  HIGH .    HEADACHE STARTED  Sunday -    TOOK TYLENOL AT 330PM-   2  XS   TABS .        VISION - SEES  DOTS-    WHEN SHE  GETS UP.       HAS TWINS.  -

## 2016-08-30 ENCOUNTER — Ambulatory Visit (HOSPITAL_COMMUNITY): Admission: RE | Admit: 2016-08-30 | Payer: Medicaid Other | Source: Ambulatory Visit

## 2016-08-30 ENCOUNTER — Inpatient Hospital Stay (HOSPITAL_COMMUNITY): Payer: Medicaid Other

## 2016-08-30 DIAGNOSIS — O30033 Twin pregnancy, monochorionic/diamniotic, third trimester: Secondary | ICD-10-CM

## 2016-08-30 DIAGNOSIS — O133 Gestational [pregnancy-induced] hypertension without significant proteinuria, third trimester: Principal | ICD-10-CM

## 2016-08-30 MED ORDER — FAMOTIDINE 20 MG PO TABS
20.0000 mg | ORAL_TABLET | Freq: Two times a day (BID) | ORAL | Status: DC
  Administered 2016-08-30: 20 mg via ORAL
  Filled 2016-08-30: qty 1

## 2016-08-30 MED ORDER — BD ASSURE BPM/AUTO ARM CUFF MISC: 1.0000 | Freq: Every day | 0 refills | Status: DC

## 2016-08-30 MED ORDER — BUTORPHANOL TARTRATE 1 MG/ML IJ SOLN
2.0000 mg | Freq: Once | INTRAMUSCULAR | Status: AC
  Administered 2016-08-30: 2 mg via INTRAVENOUS
  Filled 2016-08-30: qty 2

## 2016-08-30 NOTE — Discharge Instructions (Signed)
How to Take Your Blood Pressure Blood pressure is a measurement of how strongly your blood is pressing against the walls of your arteries. Arteries are blood vessels that carry blood from your heart throughout your body. Your health care provider takes your blood pressure at each office visit. You can also take your own blood pressure at home with a blood pressure machine. You may need to take your own blood pressure:  To confirm a diagnosis of high blood pressure (hypertension).  To monitor your blood pressure over time.  To make sure your blood pressure medicine is working. Supplies needed: To take your blood pressure, you will need a blood pressure machine. You can buy a blood pressure machine, or blood pressure monitor, at most drugstores or online. There are several types of home blood pressure monitors. When choosing one, consider the following:  Choose a monitor that has an arm cuff.  Choose a monitor that wraps snugly around your upper arm. You should be able to fit only one finger between your arm and the cuff.  Do not choose a monitor that measures your blood pressure from your wrist or finger. Your health care provider can suggest a reliable monitor that will meet your needs. How to prepare To get the most accurate reading, avoid the following for 30 minutes before you check your blood pressure:  Drinking caffeine.  Drinking alcohol.  Eating.  Smoking.  Exercising. Five minutes before you check your blood pressure:  Empty your bladder.  Sit quietly without talking in a dining chair, rather than in a soft couch or armchair. How to take your blood pressure To check your blood pressure, follow the instructions in the manual that came with your blood pressure monitor. If you have a digital blood pressure monitor, the instructions may be as follows: 1. Sit up straight. 2. Place your feet on the floor. Do not cross your ankles or legs. 3. Rest your left arm at the level of  your heart on a table or desk or on the arm of a chair. 4. Pull up your shirt sleeve. 5. Wrap the blood pressure cuff around the upper part of your left arm, 1 inch (2.5 cm) above your elbow. It is best to wrap the cuff around bare skin. 6. Fit the cuff snugly around your arm. You should be able to place only one finger between the cuff and your arm. 7. Position the cord inside the groove of your elbow. 8. Press the power button. 9. Sit quietly while the cuff inflates and deflates. 10. Read the digital reading on the monitor screen and write it down (record it). 11. Wait 2-3 minutes, then repeat the steps, starting at step 1. What does my blood pressure reading mean? A blood pressure reading consists of a higher number over a lower number. Ideally, your blood pressure should be below 120/80. The first ("top") number is called the systolic pressure. It is a measure of the pressure in your arteries as your heart beats. The second ("bottom") number is called the diastolic pressure. It is a measure of the pressure in your arteries as the heart relaxes. Blood pressure is classified into four stages. The following are the stages for adults who do not have a short-term serious illness or a chronic condition. Systolic pressure and diastolic pressure are measured in a unit called mm Hg. Normal   Systolic pressure: below 893.  Diastolic pressure: below 80. Elevated   Systolic pressure: 810-175.  Diastolic pressure: below 80.  Hypertension stage 1   Systolic pressure: 025-852.  Diastolic pressure: 77-82. Hypertension stage 2   Systolic pressure: 423 or above.  Diastolic pressure: 90 or above. You can have prehypertension or hypertension even if only the systolic or only the diastolic number in your reading is higher than normal. Follow these instructions at home:  Check your blood pressure as often as recommended by your health care provider.  Take your monitor to the next appointment with  your health care provider to make sure:  That you are using it correctly.  That it provides accurate readings.  Be sure you understand what your goal blood pressure numbers are.  Tell your health care provider if you are having any side effects from blood pressure medicine. Contact a health care provider if:  Your blood pressure is consistently high. Get help right away if:  Your systolic blood pressure is higher than 180.  Your diastolic blood pressure is higher than 110. This information is not intended to replace advice given to you by your health care provider. Make sure you discuss any questions you have with your health care provider. Document Released: 09/24/2015 Document Revised: 12/07/2015 Document Reviewed: 09/24/2015 Elsevier Interactive Patient Education  2017 [employer].   Hypertension During Pregnancy Hypertension, commonly called high blood pressure, is when the force of blood pumping through your arteries is too strong. Arteries are blood vessels that carry blood from the heart throughout the body. Hypertension during pregnancy can cause problems for you and your baby. Your baby may be born early (prematurely) or may not weigh as much as he or she should at birth. Very bad cases of hypertension during pregnancy can be life-threatening. Different types of hypertension can occur during pregnancy. These include:  Chronic hypertension. This happens when:  You have hypertension before pregnancy and it continues during pregnancy.  You develop hypertension before you are [redacted] weeks pregnant, and it continues during pregnancy.  Gestational hypertension. This is hypertension that develops after the 20th week of pregnancy.  Preeclampsia, also called toxemia of pregnancy. This is a very serious type of hypertension that develops only during pregnancy. It affects the whole body, and it can be very dangerous for you and your baby. Gestational hypertension and preeclampsia  usually go away within 6 weeks after your baby is born. Women who have hypertension during pregnancy have a greater chance of developing hypertension later in life or during future pregnancies. What are the causes? The exact cause of hypertension is not known. What increases the risk? There are certain factors that make it more likely for you to develop hypertension during pregnancy. These include:  Having hypertension during a previous pregnancy or prior to pregnancy.  Being overweight.  Being older than age 17.  Being pregnant for the first time or being pregnant with more than one baby.  Becoming pregnant using fertilization methods such as IVF (in vitro fertilization).  Having diabetes, kidney problems, or systemic lupus erythematosus.  Having a family history of hypertension. What are the signs or symptoms? Chronic hypertension and gestational hypertension rarely cause symptoms. Preeclampsia causes symptoms, which may include:  Increased protein in your urine. Your health care provider will check for this at every visit before you give birth (prenatal visit).  Severe headaches.  Sudden weight gain.  Swelling of the hands, face, legs, and feet.  Nausea and vomiting.  Vision problems, such as blurred or double vision.  Numbness in the face, arms, legs, and feet.  Dizziness.  Slurred speech.  Sensitivity to bright lights.  Abdominal pain.  Convulsions. How is this diagnosed? You may be diagnosed with hypertension during a routine prenatal exam. At each prenatal visit, you may:  Have a urine test to check for high amounts of protein in your urine.  Have your blood pressure checked. A blood pressure reading is recorded as two numbers, such as "120 over 80" (or 120/80). The first ("top") number is called the systolic pressure. It is a measure of the pressure in your arteries when your heart beats. The second ("bottom") number is called the diastolic pressure. It is a  measure of the pressure in your arteries as your heart relaxes between beats. Blood pressure is measured in a unit called mm Hg. A normal blood pressure reading is:  Systolic: below 401.  Diastolic: below 80. The type of hypertension that you are diagnosed with depends on your test results and when your symptoms developed.  Chronic hypertension is usually diagnosed before 20 weeks of pregnancy.  Gestational hypertension is usually diagnosed after 20 weeks of pregnancy.  Hypertension with high amounts of protein in the urine is diagnosed as preeclampsia.  Blood pressure measurements that stay above 027 systolic, or above 253 diastolic, are signs of severe preeclampsia. How is this treated? Treatment for hypertension during pregnancy varies depending on the type of hypertension you have and how serious it is.  If you take medicines called ACE inhibitors to treat chronic hypertension, you may need to switch medicines. ACE inhibitors should not be taken during pregnancy.  If you have gestational hypertension, you may need to take blood pressure medicine.  If you are at risk for preeclampsia, your health care provider may recommend that you take a low-dose aspirin every day to prevent high blood pressure during your pregnancy.  If you have severe preeclampsia, you may need to be hospitalized so you and your baby can be monitored closely. You may also need to take medicine (magnesium sulfate) to prevent seizures and to lower blood pressure. This medicine may be given as an injection or through an IV tube.  In some cases, if your condition gets worse, you may need to deliver your baby early. Follow these instructions at home: Eating and drinking   Drink enough fluid to keep your urine clear or pale yellow.  Eat a healthy diet that is low in salt (sodium). Do not add salt to your food. Check food labels to see how much sodium a food or beverage contains. Lifestyle   Do not use any products  that contain nicotine or tobacco, such as cigarettes and e-cigarettes. If you need help quitting, ask your health care provider.  Do not use alcohol.  Avoid caffeine.  Avoid stress as much as possible. Rest and get plenty of sleep. General instructions   Take over-the-counter and prescription medicines only as told by your health care provider.  While lying down, lie on your left side. This keeps pressure off your baby.  While sitting or lying down, raise (elevate) your feet. Try putting some pillows under your lower legs.  Exercise regularly. Ask your health care provider what kinds of exercise are best for you.  Keep all prenatal and follow-up visits as told by your health care provider. This is important. Contact a health care provider if:  You have symptoms that your health care provider told you may require more treatment or monitoring, such as:  Fever.  Vomiting.  Headache. Get help right away if:  You have severe abdominal  pain or vomiting that does not get better with treatment.  You suddenly develop swelling in your hands, ankles, or face.  You gain 4 lbs (1.8 kg) or more in 1 week.  You develop vaginal bleeding, or you have blood in your urine.  You do not feel your baby moving as much as usual.  You have blurred or double vision.  You have muscle twitching or sudden tightening (spasms).  You have shortness of breath.  Your lips or fingernails turn blue. This information is not intended to replace advice given to you by your health care provider. Make sure you discuss any questions you have with your health care provider. Document Released: 01/03/2011 Document Revised: 11/05/2015 Document Reviewed: 10/01/2015 Elsevier Interactive Patient Education  2017 [employer].

## 2016-08-30 NOTE — Progress Notes (Signed)
Patient ID: Robin Horn, female   DOB: 07/05/1985, 31 y.o.   MRN: [id number] [place]) NOTE  Robin Horn is a 31 y.o. [id number] with mono-di twins at 105w3d  who is admitted for gestational hypertension rule out superimposed preeclampsia.    Fetal presentation is cephalic/breech. Length of Stay:  1  Days  Date of admission:08/29/2016  Subjective: Patient reports mild headache and persistent scotomata. She did not sleep well last night as she is very anxious regarding the status of this pregnancy Patient reports the fetal movement as active x 2 Patient reports uterine contraction  activity as none. Patient reports  vaginal bleeding as none. Patient describes fluid per vagina as None.  Vitals:  Blood pressure 135/90, pulse 96, temperature 98.6 F (37 C), temperature source Oral, resp. rate 15, height 5\' 3"  (1.6 m), weight 191 lb (86.6 kg), last menstrual period 01/14/2016, SpO2 99 %. Vitals:   08/30/16 0300 08/30/16 0400 08/30/16 0500 08/30/16 0600  BP: (!) 143/92 118/65 (!) 143/76 135/90  Pulse: 98 (!) 106 (!) 103 96  Resp: 16 16 16 15   Temp:      TempSrc:      SpO2:      Weight:      Height:       Physical Examination:  General appearance - alert, well appearing, and in no distress Chest - clear to auscultation, no wheezes, rales or rhonchi, symmetric air entry Heart - normal rate, regular rhythm Abdomen - soft, gravid, NT Fundal Height:  size greater than dates Pelvic Exam:  examination not indicated Cervical Exam: Not evaluated.  Extremities: no edema, redness or tenderness in the calves or thighs with DTRs 2+ bilaterally Membranes:intact  Fetal Monitoring:  Baseline: 140 x 2 bpm, Variability: Good {> 6 bpm), Accelerations: Reactive and Decelerations: Absent     Labs:  Results for orders placed or performed during the hospital encounter of 08/29/16 (from the past 24 hour(s))  Urinalysis, Routine w reflex microscopic   Collection Time:  08/29/16  7:36 PM  Result Value Ref Range   Color, Urine YELLOW YELLOW   APPearance CLEAR CLEAR   Specific Gravity, Urine 1.012 1.005 - 1.030   pH 5.0 5.0 - 8.0   Glucose, UA NEGATIVE NEGATIVE mg/dL   Hgb urine dipstick NEGATIVE NEGATIVE   Bilirubin Urine NEGATIVE NEGATIVE   Ketones, ur NEGATIVE NEGATIVE mg/dL   Protein, ur NEGATIVE NEGATIVE mg/dL   Nitrite NEGATIVE NEGATIVE   Leukocytes, UA NEGATIVE NEGATIVE  Protein / creatinine ratio, urine   Collection Time: 08/29/16  7:36 PM  Result Value Ref Range   Creatinine, Urine 108.00 mg/dL   Total Protein, Urine 14 mg/dL   Protein Creatinine Ratio 0.13 0.00 - 0.15 mg/mg[Cre]  Urine rapid drug screen (hosp performed)   Collection Time: 08/29/16  7:36 PM  Result Value Ref Range   Opiates NONE DETECTED NONE DETECTED   Cocaine NONE DETECTED NONE DETECTED   Benzodiazepines NONE DETECTED NONE DETECTED   Amphetamines NONE DETECTED NONE DETECTED   Tetrahydrocannabinol NONE DETECTED NONE DETECTED   Barbiturates POSITIVE (A) NONE DETECTED  CBC   Collection Time: 08/29/16  8:05 PM  Result Value Ref Range   WBC 9.6 4.0 - 10.5 K/uL   RBC 3.92 3.87 - 5.11 MIL/uL   Hemoglobin 11.3 (L) 12.0 - 15.0 g/dL   HCT 34.9 (L) 36.0 - 46.0 %   MCV 89.0 78.0 - 100.0 fL   MCH 28.8 26.0 - 34.0 pg   MCHC 32.4  30.0 - 36.0 g/dL   RDW 15.6 (H) 11.5 - 15.5 %   Platelets 195 150 - 400 K/uL  Comprehensive metabolic panel   Collection Time: 08/29/16  8:05 PM  Result Value Ref Range   Sodium 136 135 - 145 mmol/L   Potassium 3.5 3.5 - 5.1 mmol/L   Chloride 104 101 - 111 mmol/L   CO2 23 22 - 32 mmol/L   Glucose, Bld 92 65 - 99 mg/dL   BUN 6 6 - 20 mg/dL   Creatinine, Ser 0.60 0.44 - 1.00 mg/dL   Calcium 9.0 8.9 - 10.3 mg/dL   Total Protein 6.6 6.5 - 8.1 g/dL   Albumin 3.1 (L) 3.5 - 5.0 g/dL   AST 23 15 - 41 U/L   ALT 16 14 - 54 U/L   Alkaline Phosphatase 153 (H) 38 - 126 U/L   Total Bilirubin 0.2 (L) 0.3 - 1.2 mg/dL   GFR calc non Af Amer >60 >60 mL/min    GFR calc Af Amer >60 >60 mL/min   Anion gap 9 5 - 15  Type and screen [place]   Collection Time: 08/29/16  9:45 PM  Result Value Ref Range   ABO/RH(D) AB POS    Antibody Screen NEG    Sample Expiration 09/01/2016     Imaging Studies:    Currently EPIC will not allow sonographic studies to automatically populate into notes.  In the meantime, copy and paste results into note or free text.  Medications:  Scheduled . betamethasone acetate-betamethasone sodium phosphate  12 mg Intramuscular Q24H  . docusate sodium  100 mg Oral Daily  . prenatal multivitamin  1 tablet Oral Q1200   I have reviewed the patient's current medications.  ASSESSMENT: [id number] [redacted]w[redacted]d Estimated Date of Delivery: 11/05/16  Patient Active Problem List   Diagnosis Date Noted  . Gestational hypertension 08/29/2016  . Unwanted fertility 08/14/2016  . Supervision of high-risk pregnancy 05/25/2016  . Drug use affecting pregnancy, antepartum 05/25/2016  . Bipolar 1 disorder ([place]) 05/25/2016  . Insomnia 05/25/2016  . Monochorionic diamniotic twin pregnancy, antepartum 04/25/2016    PLAN: 1) Mono-Di twins - Patient scheduled for dopplers today with MFM (normal growth on 4/18) - Fetal status reassuring - Second dose of BMZ this evening  2) Gestational HTN - Complete 24 hr urine collection - Continue BP monitoring  Continue current antepartum care Lenor Provencher 08/30/2016,8:01 AM

## 2016-08-30 NOTE — Anesthesia Pain Management Evaluation Note (Signed)
  CRNA Pain Management Visit Note  Patient: Robin Horn, 31 y.o., female  "Hello I am a member of the anesthesia team at [facility]. We have an anesthesia team available at all times to provide care throughout the hospital, including epidural management and anesthesia for C-section. I don't know your plan for the delivery whether it a natural birth, water birth, IV sedation, nitrous supplementation, doula or epidural, but we want to meet your pain goals."   1.Was your pain managed to your expectations on prior hospitalizations?   Yes   2.What is your expectation for pain management during this hospitalization?     Epidural and IV pain meds  3.How can we help you reach that goal? IV pain meds, epidural once in labor.  Record the patient's initial score and the patient's pain goal.   Pain: 5--I informed the patient that since she is not in labor, she would not get an epidural at this point, but that if she is more uncomfortable than she wants to feel she can ask her L&D RN if she can get pain meds.  Pain Goal: 5 [facility] wants you to be able to say your pain was always managed very well.  Jillianne Gamino L 08/30/2016

## 2016-08-30 NOTE — Progress Notes (Signed)
While in ultrasound pt had an unmeasured void. Faculty practice notified. Pt encouraged to start another 24 hour urine. Pt sent home supplies and given instruction re: 24 hour urine. If pt collects urine she will bring it to her clinic visit on Friday.

## 2016-08-30 NOTE — Discharge Summary (Signed)
OB Discharge Summary     Patient Name: Robin Horn DOB: 01-19-1986 MRN: [id number]  Date of admission: 08/29/2016 Delivering MD: This patient has no babies on file.  Date of discharge: 08/30/2016  Admitting diagnosis: 3WKS SEVERE SWELLING, BACK PAIN, HEADACHE Intrauterine pregnancy: [redacted]w[redacted]d     Secondary diagnosis:  Active Problems:   Gestational hypertension  Additional problems:none     Discharge diagnosis: Gestational Hypertension                                                                                                Hospital course:  Patient was admitted with concerns for gHTN versus pre-eclampsia. She had headache which resolved with fiorocet overnight. She had an [place] with MFM today which revealed normal growth, but did have elevated UA doppler pressures with intermittent absent flow. We attempted to collect a 24 urine today, but patient voided while in [place] and it was not collected. She states she will not do a collection at home as she is too busy. She received 2 doses of Betamethasone. She was discharged home with close follow up. BP were noted to be mild range or normal prior to discharge. She was given a prescription a a BP cuff to take her BP at home.   Physical exam  Vitals:   08/30/16 0400 08/30/16 0500 08/30/16 0600 08/30/16 0832  BP: 118/65 (!) 143/76 135/90 (!) 135/91  Pulse: (!) 106 (!) 103 96 97  Resp: 16 16 15    Temp:      TempSrc:      SpO2:      Weight:      Height:       Labs: Lab Results  Component Value Date   WBC 9.6 08/29/2016   HGB 11.3 (L) 08/29/2016   HCT 34.9 (L) 08/29/2016   MCV 89.0 08/29/2016   PLT 195 08/29/2016   CMP Latest Ref Rng & Units 08/29/2016  Glucose 65 - 99 mg/dL 92  BUN 6 - 20 mg/dL 6  Creatinine 0.44 - 1.00 mg/dL 0.60  Sodium 135 - 145 mmol/L 136  Potassium 3.5 - 5.1 mmol/L 3.5  Chloride 101 - 111 mmol/L 104  CO2 22 - 32 mmol/L 23  Calcium 8.9 - 10.3 mg/dL 9.0  Total Protein 6.5 - 8.1 g/dL 6.6  Total Bilirubin 0.3  - 1.2 mg/dL 0.2(L)  Alkaline Phos 38 - 126 U/L 153(H)  AST 15 - 41 U/L 23  ALT 14 - 54 U/L 16    Discharge instruction: per After Visit Summary and "Baby and Me Booklet".  After visit meds:  Allergies as of 08/30/2016      Reactions   Lactose Intolerance (gi) Diarrhea      Medication List    TAKE these medications   acetaminophen 500 MG tablet Commonly known as:  TYLENOL Take 1,000 mg by mouth every 6 (six) hours as needed for moderate pain.   B-D ASSURE BPM/AUTO ARM CUFF Misc 1 each by Does not apply route daily.   butalbital-acetaminophen-caffeine 50-325-40 MG tablet Commonly known as:  FIORICET, ESGIC Take 1-2 tablets by mouth every  6 (six) hours as needed for headache.   COMFORT FIT MATERNITY SUPP SM Misc 1 Units by Does not apply route daily as needed.   cyclobenzaprine 10 MG tablet Commonly known as:  FLEXERIL Take 1 tablet (10 mg total) by mouth 3 (three) times daily as needed for muscle spasms.   PRE-NATAL FORMULA Tabs Take 1 tablet by mouth daily.   ranitidine 150 MG tablet Commonly known as:  ZANTAC Take 1 tablet (150 mg total) by mouth 2 (two) times daily.   valACYclovir 1000 MG tablet Commonly known as:  VALTREX Take 1 tablet (1,000 mg total) by mouth 2 (two) times daily. Take for ten days.   zolpidem 5 MG tablet Commonly known as:  AMBIEN take 1 tablet by mouth at bedtime if needed What changed:  See the new instructions.       Diet: low salt diet  Activity: Advance as tolerated. Pelvic rest for 6 weeks.   Follow up Appt: Future Appointments Date Time Provider [place]  09/01/2016 8:00 AM [place] Mode, MD [place]  09/06/2016 11:00 AM [facility] [place] 3 [facility]-MFCUS M[facility]-US  09/13/2016 11:15 AM [facility] [place] 4 [facility]-MFCUS MFC-US  09/20/2016 11:00 AM [place] 3 [facility]-M[facility]US MFC-US  09/27/2016 11:00 AM [place] 3 [facility]-MFCUS MFC-US   Follow up Visit:No Follow-up on file.  08/30/2016 Jacquiline Doe, MD

## 2016-09-01 ENCOUNTER — Encounter: Payer: Self-pay | Admitting: Obstetrics and Gynecology

## 2016-09-02 ENCOUNTER — Inpatient Hospital Stay (HOSPITAL_COMMUNITY)
Admission: AD | Admit: 2016-09-02 | Discharge: 2016-09-02 | Disposition: A | Discharge status: Home / Self Care | Payer: Medicaid Other | Source: Ambulatory Visit | Attending: Obstetrics & Gynecology | Admitting: Obstetrics & Gynecology

## 2016-09-02 ENCOUNTER — Encounter (HOSPITAL_COMMUNITY): Payer: Self-pay | Admitting: *Deleted

## 2016-09-02 DIAGNOSIS — R531 Weakness: Secondary | ICD-10-CM | POA: Insufficient documentation

## 2016-09-02 DIAGNOSIS — O0943 Supervision of pregnancy with grand multiparity, third trimester: Secondary | ICD-10-CM | POA: Diagnosis not present

## 2016-09-02 DIAGNOSIS — O139 Gestational [pregnancy-induced] hypertension without significant proteinuria, unspecified trimester: Secondary | ICD-10-CM | POA: Diagnosis not present

## 2016-09-02 DIAGNOSIS — Z9889 Other specified postprocedural states: Secondary | ICD-10-CM | POA: Diagnosis not present

## 2016-09-02 DIAGNOSIS — O30039 Twin pregnancy, monochorionic/diamniotic, unspecified trimester: Secondary | ICD-10-CM

## 2016-09-02 DIAGNOSIS — Z3A30 30 weeks gestation of pregnancy: Secondary | ICD-10-CM | POA: Diagnosis not present

## 2016-09-02 DIAGNOSIS — O26893 Other specified pregnancy related conditions, third trimester: Secondary | ICD-10-CM | POA: Diagnosis not present

## 2016-09-02 DIAGNOSIS — O99343 Other mental disorders complicating pregnancy, third trimester: Secondary | ICD-10-CM | POA: Insufficient documentation

## 2016-09-02 DIAGNOSIS — R109 Unspecified abdominal pain: Secondary | ICD-10-CM | POA: Insufficient documentation

## 2016-09-02 DIAGNOSIS — O99333 Smoking (tobacco) complicating pregnancy, third trimester: Secondary | ICD-10-CM | POA: Insufficient documentation

## 2016-09-02 DIAGNOSIS — O0940 Supervision of pregnancy with grand multiparity, unspecified trimester: Secondary | ICD-10-CM

## 2016-09-02 DIAGNOSIS — O99323 Drug use complicating pregnancy, third trimester: Secondary | ICD-10-CM

## 2016-09-02 DIAGNOSIS — O0993 Supervision of high risk pregnancy, unspecified, third trimester: Secondary | ICD-10-CM

## 2016-09-02 DIAGNOSIS — R51 Headache: Secondary | ICD-10-CM | POA: Insufficient documentation

## 2016-09-02 LAB — WET PREP, GENITAL
CLUE CELLS WET PREP: NONE SEEN
Sperm: NONE SEEN
TRICH WET PREP: NONE SEEN
Yeast Wet Prep HPF POC: NONE SEEN

## 2016-09-02 LAB — CBC WITH DIFFERENTIAL/PLATELET
Basophils Absolute: 0 10*3/uL (ref 0.0–0.1)
Basophils Relative: 0 %
EOS PCT: 1 %
Eosinophils Absolute: 0.1 10*3/uL (ref 0.0–0.7)
HCT: 32.6 % — ABNORMAL LOW (ref 36.0–46.0)
Hemoglobin: 10.6 g/dL — ABNORMAL LOW (ref 12.0–15.0)
LYMPHS ABS: 1.9 10*3/uL (ref 0.7–4.0)
LYMPHS PCT: 21 %
MCH: 28.8 pg (ref 26.0–34.0)
MCHC: 32.5 g/dL (ref 30.0–36.0)
MCV: 88.6 fL (ref 78.0–100.0)
MONO ABS: 0.8 10*3/uL (ref 0.1–1.0)
MONOS PCT: 8 %
Neutro Abs: 6.2 10*3/uL (ref 1.7–7.7)
Neutrophils Relative %: 70 %
PLATELETS: 168 10*3/uL (ref 150–400)
RBC: 3.68 MIL/uL — ABNORMAL LOW (ref 3.87–5.11)
RDW: 15.3 % (ref 11.5–15.5)
WBC: 8.9 10*3/uL (ref 4.0–10.5)

## 2016-09-02 LAB — URINALYSIS, ROUTINE W REFLEX MICROSCOPIC
Bilirubin Urine: NEGATIVE
GLUCOSE, UA: NEGATIVE mg/dL
Hgb urine dipstick: NEGATIVE
KETONES UR: NEGATIVE mg/dL
NITRITE: NEGATIVE
PH: 6 (ref 5.0–8.0)
Protein, ur: 30 mg/dL — AB
SPECIFIC GRAVITY, URINE: 1.018 (ref 1.005–1.030)

## 2016-09-02 LAB — COMPREHENSIVE METABOLIC PANEL
ALT: 15 U/L (ref 14–54)
AST: 20 U/L (ref 15–41)
Albumin: 2.7 g/dL — ABNORMAL LOW (ref 3.5–5.0)
Alkaline Phosphatase: 136 U/L — ABNORMAL HIGH (ref 38–126)
Anion gap: 9 (ref 5–15)
BUN: 8 mg/dL (ref 6–20)
CHLORIDE: 105 mmol/L (ref 101–111)
CO2: 22 mmol/L (ref 22–32)
CREATININE: 0.66 mg/dL (ref 0.44–1.00)
Calcium: 8.8 mg/dL — ABNORMAL LOW (ref 8.9–10.3)
Glucose, Bld: 128 mg/dL — ABNORMAL HIGH (ref 65–99)
Potassium: 3.3 mmol/L — ABNORMAL LOW (ref 3.5–5.1)
Sodium: 136 mmol/L (ref 135–145)
Total Bilirubin: 0.1 mg/dL — ABNORMAL LOW (ref 0.3–1.2)
Total Protein: 6.1 g/dL — ABNORMAL LOW (ref 6.5–8.1)

## 2016-09-02 LAB — RAPID URINE DRUG SCREEN, HOSP PERFORMED
Amphetamines: NOT DETECTED
BARBITURATES: POSITIVE — AB
Benzodiazepines: NOT DETECTED
COCAINE: NOT DETECTED
Opiates: NOT DETECTED
Tetrahydrocannabinol: NOT DETECTED

## 2016-09-02 LAB — PROTEIN / CREATININE RATIO, URINE
CREATININE, URINE: 179 mg/dL
Protein Creatinine Ratio: 0.11 mg/mg{Cre} (ref 0.00–0.15)
TOTAL PROTEIN, URINE: 19 mg/dL

## 2016-09-02 LAB — GLUCOSE, CAPILLARY: GLUCOSE-CAPILLARY: 129 mg/dL — AB (ref 65–99)

## 2016-09-02 NOTE — Discharge Instructions (Signed)
Abdominal Pain During Pregnancy °Belly (abdominal) pain is common during pregnancy. Most of the time, it is not a serious problem. Other times, it can be a sign that something is wrong with the pregnancy. Always tell your doctor if you have belly pain. °Follow these instructions at home: °Monitor your belly pain for any changes. The following actions may help you feel better: °· Do not have sex (intercourse) or put anything in your vagina until you feel better. °· Rest until your pain stops. °· Drink clear fluids if you feel sick to your stomach (nauseous). Do not eat solid food until you feel better. °· Only take medicine as told by your doctor. °· Keep all doctor visits as told. °Get help right away if: °· You are bleeding, leaking fluid, or pieces of tissue come out of your vagina. °· You have more pain or cramping. °· You keep throwing up (vomiting). °· You have pain when you pee (urinate) or have blood in your pee. °· You have a fever. °· You do not feel your baby moving as much. °· You feel very weak or feel like passing out. °· You have trouble breathing, with or without belly pain. °· You have a very bad headache and belly pain. °· You have fluid leaking from your vagina and belly pain. °· You keep having watery poop (diarrhea). °· Your belly pain does not go away after resting, or the pain gets worse. °This information is not intended to replace advice given to you by your health care provider. Make sure you discuss any questions you have with your health care provider. °Document Released: 04/05/2009 Document Revised: 11/24/2015 Document Reviewed: 11/14/2012 °Elsevier Interactive Patient Education © 2017 [employer] ° °

## 2016-09-02 NOTE — MAU Provider Note (Signed)
History   [id number] @ 30.[redacted] wks gestation. In with c/o feeling weak, abd pain, and headache that started today. Failed to keep appt Friday.  CSN: [id number]  Arrival date & time 09/02/16  1615   None     No chief complaint on file.   HPI  Past Medical History:  Diagnosis Date  . Abnormal Pap smear of cervix   . Anxiety   . Chlamydia   . Depression   . Gonorrhea   . Herpes genitalia   . Migraines   . Ovarian cyst   . Preeclampsia   . Pregnancy induced hypertension     Past Surgical History:  Procedure Laterality Date  . COLPOSCOPY    . DILATION AND CURETTAGE OF UTERUS    . TONSILLECTOMY      Family History  Problem Relation Age of Onset  . Kidney disease Mother   . Cancer Paternal Grandmother   . Other Neg Hx     Social History  Substance Use Topics  . Smoking status: Light Tobacco Smoker    Packs/day: 0.25    Types: Cigarettes  . Smokeless tobacco: Never Used  . Alcohol use 0.6 oz/week    1 Glasses of wine per week     Comment: Once every 2 months. Nothing since finding out about pregnancy.     OB History    Gravida Para Term Preterm AB Living   8 4 4  0 3 4   SAB TAB Ectopic Multiple Live Births   0 3 0 0 4      Review of Systems  HENT: Negative.   Eyes: Negative.   Respiratory: Negative.   Cardiovascular: Negative.   Gastrointestinal: Positive for abdominal pain.  Endocrine: Negative.   Genitourinary: Positive for pelvic pain and vaginal discharge.  Musculoskeletal: Positive for back pain.  Skin: Negative.   Allergic/Immunologic: Negative.   Neurological: Positive for weakness.  Hematological: Negative.   Psychiatric/Behavioral: Negative.     Allergies  Lactose intolerance (gi)  Home Medications    BP (!) 153/95 (BP Location: Right Arm)   Pulse (!) 110   Temp 98.1 F (36.7 C) (Oral)   Resp 20   Ht 5\' 3"  (1.6 m)   Wt 200 lb (90.7 kg)   LMP 01/14/2016 (Approximate)   BMI 35.43 kg/m   Physical Exam  Constitutional: She is  oriented to person, place, and time. She appears well-developed and well-nourished.  HENT:  Head: Normocephalic.  Eyes: Pupils are equal, round, and reactive to light.  Neck: Normal range of motion.  Cardiovascular: Normal rate, regular rhythm, normal heart sounds and intact distal pulses.   Pulmonary/Chest: Effort normal and breath sounds normal.  Abdominal: Soft. Bowel sounds are normal.  Genitourinary: Vagina normal and uterus normal.  Musculoskeletal: Normal range of motion.  Neurological: She is alert and oriented to person, place, and time. She has normal reflexes.  Skin: Skin is warm and dry.  Psychiatric: She has a normal mood and affect. Her behavior is normal. Judgment and thought content normal.    MAU Course  Procedures (including critical care time)  Labs Reviewed  URINALYSIS, ROUTINE W REFLEX MICROSCOPIC  CBC WITH DIFFERENTIAL/PLATELET  COMPREHENSIVE METABOLIC PANEL  PROTEIN / CREATININE RATIO, URINE  RAPID URINE DRUG SCREEN, HOSP PERFORMED   No results found.   No diagnosis found.  GHTN ABD in preg  MDM  FHR pattern reassuring, no uc's, SVE ft/th/post/high. Discussed importance of keeping appts, PIH labs WNL and stable. Pt to call and  make appt for Monday. Message sent to clinic to get pt in for visit.

## 2016-09-04 ENCOUNTER — Encounter (HOSPITAL_COMMUNITY): Payer: Self-pay | Admitting: *Deleted

## 2016-09-04 ENCOUNTER — Other Ambulatory Visit (HOSPITAL_COMMUNITY): Payer: Self-pay | Admitting: Maternal and Fetal Medicine

## 2016-09-04 ENCOUNTER — Inpatient Hospital Stay (HOSPITAL_COMMUNITY): Payer: Medicaid Other

## 2016-09-04 ENCOUNTER — Inpatient Hospital Stay (HOSPITAL_COMMUNITY)
Admission: AD | Admit: 2016-09-04 | Discharge: 2016-09-04 | Disposition: A | Discharge status: Home / Self Care | Payer: Medicaid Other | Source: Ambulatory Visit | Attending: Obstetrics & Gynecology | Admitting: Obstetrics & Gynecology

## 2016-09-04 DIAGNOSIS — O3483 Maternal care for other abnormalities of pelvic organs, third trimester: Secondary | ICD-10-CM | POA: Diagnosis not present

## 2016-09-04 DIAGNOSIS — N83209 Unspecified ovarian cyst, unspecified side: Secondary | ICD-10-CM | POA: Diagnosis not present

## 2016-09-04 DIAGNOSIS — O26893 Other specified pregnancy related conditions, third trimester: Secondary | ICD-10-CM | POA: Insufficient documentation

## 2016-09-04 DIAGNOSIS — O133 Gestational [pregnancy-induced] hypertension without significant proteinuria, third trimester: Secondary | ICD-10-CM | POA: Diagnosis not present

## 2016-09-04 DIAGNOSIS — O4703 False labor before 37 completed weeks of gestation, third trimester: Secondary | ICD-10-CM

## 2016-09-04 DIAGNOSIS — R51 Headache: Secondary | ICD-10-CM | POA: Diagnosis not present

## 2016-09-04 DIAGNOSIS — F419 Anxiety disorder, unspecified: Secondary | ICD-10-CM | POA: Diagnosis not present

## 2016-09-04 DIAGNOSIS — O30033 Twin pregnancy, monochorionic/diamniotic, third trimester: Secondary | ICD-10-CM

## 2016-09-04 DIAGNOSIS — F329 Major depressive disorder, single episode, unspecified: Secondary | ICD-10-CM | POA: Insufficient documentation

## 2016-09-04 DIAGNOSIS — O99333 Smoking (tobacco) complicating pregnancy, third trimester: Secondary | ICD-10-CM | POA: Insufficient documentation

## 2016-09-04 DIAGNOSIS — Z3A31 31 weeks gestation of pregnancy: Secondary | ICD-10-CM | POA: Diagnosis not present

## 2016-09-04 DIAGNOSIS — O99343 Other mental disorders complicating pregnancy, third trimester: Secondary | ICD-10-CM | POA: Insufficient documentation

## 2016-09-04 DIAGNOSIS — O288 Other abnormal findings on antenatal screening of mother: Secondary | ICD-10-CM

## 2016-09-04 DIAGNOSIS — O365932 Maternal care for other known or suspected poor fetal growth, third trimester, fetus 2: Secondary | ICD-10-CM

## 2016-09-04 DIAGNOSIS — G44209 Tension-type headache, unspecified, not intractable: Secondary | ICD-10-CM

## 2016-09-04 LAB — URINALYSIS, ROUTINE W REFLEX MICROSCOPIC
Bilirubin Urine: NEGATIVE
Glucose, UA: NEGATIVE mg/dL
HGB URINE DIPSTICK: NEGATIVE
Ketones, ur: NEGATIVE mg/dL
NITRITE: NEGATIVE
Protein, ur: NEGATIVE mg/dL
SPECIFIC GRAVITY, URINE: 1.011 (ref 1.005–1.030)
pH: 7 (ref 5.0–8.0)

## 2016-09-04 LAB — COMPREHENSIVE METABOLIC PANEL
ALBUMIN: 2.8 g/dL — AB (ref 3.5–5.0)
ALK PHOS: 156 U/L — AB (ref 38–126)
ALT: 14 U/L (ref 14–54)
AST: 15 U/L (ref 15–41)
Anion gap: 9 (ref 5–15)
BUN: 7 mg/dL (ref 6–20)
CALCIUM: 9.3 mg/dL (ref 8.9–10.3)
CO2: 25 mmol/L (ref 22–32)
Chloride: 105 mmol/L (ref 101–111)
Creatinine, Ser: 0.72 mg/dL (ref 0.44–1.00)
GFR calc Af Amer: >60 mL/min (ref >60)
GFR calc non Af Amer: >60 mL/min (ref >60)
GLUCOSE: 88 mg/dL (ref 65–99)
Potassium: 3.6 mmol/L (ref 3.5–5.1)
Sodium: 139 mmol/L (ref 135–145)
TOTAL PROTEIN: 6.5 g/dL (ref 6.5–8.1)
Total Bilirubin: 0.6 mg/dL (ref 0.3–1.2)

## 2016-09-04 LAB — CBC
HEMATOCRIT: 34.1 % — AB (ref 36.0–46.0)
HEMOGLOBIN: 11.2 g/dL — AB (ref 12.0–15.0)
MCH: 29.2 pg (ref 26.0–34.0)
MCHC: 32.8 g/dL (ref 30.0–36.0)
MCV: 89 fL (ref 78.0–100.0)
Platelets: 178 10*3/uL (ref 150–400)
RBC: 3.83 MIL/uL — ABNORMAL LOW (ref 3.87–5.11)
RDW: 15.4 % (ref 11.5–15.5)
WBC: 10.6 10*3/uL — ABNORMAL HIGH (ref 4.0–10.5)

## 2016-09-04 LAB — PROTEIN / CREATININE RATIO, URINE
Creatinine, Urine: 94 mg/dL
Protein Creatinine Ratio: 0.14 mg/mg{Cre} (ref 0.00–0.15)
Total Protein, Urine: 13 mg/dL

## 2016-09-04 MED ORDER — ACETAMINOPHEN 500 MG PO TABS
1000.0000 mg | ORAL_TABLET | Freq: Once | ORAL | Status: AC
  Administered 2016-09-04: 1000 mg via ORAL
  Filled 2016-09-04: qty 2

## 2016-09-04 MED ORDER — CYCLOBENZAPRINE HCL 10 MG PO TABS
10.0000 mg | ORAL_TABLET | Freq: Once | ORAL | Status: AC
  Administered 2016-09-04: 10 mg via ORAL
  Filled 2016-09-04: qty 1

## 2016-09-04 MED ORDER — BUTALBITAL-APAP-CAFFEINE 50-325-40 MG PO TABS
2.0000 | ORAL_TABLET | Freq: Once | ORAL | Status: DC
  Filled 2016-09-04: qty 2

## 2016-09-04 NOTE — MAU Provider Note (Signed)
History     CSN: [id number]  Arrival date and time: 09/04/16 1308   None     Chief Complaint  Patient presents with  . contractoions   HPI   [name] is a 31 y.o. female [phone] @ 79w1dmono/di twins here with multiple complaints. She complains of dizziness, back pain, headache and vaginal pressure.  Symptoms started last Friday. Says she has had HA throughout her pregnancy, however the HA's seem to be getting worse. She is taking Fioricet and Tylenol for the HA. Last dose of Tylenol was yesterday and it did not help. Last dose of Fioricet was over a week ago. She currently rates her HA pain 8/10.  Patient missed her OB appointment on Friday. She was instructed to return a 24 hour urine to that visit.   OB History    Gravida Para Term Preterm AB Living   '8 4 4 '$ 0 3 4   SAB TAB Ectopic Multiple Live Births   0 3 0 0 4      Past Medical History:  Diagnosis Date  . Abnormal Pap smear of cervix   . Anxiety   . Chlamydia   . Depression   . Gonorrhea   . Herpes genitalia   . Migraines   . Ovarian cyst   . Preeclampsia   . Pregnancy induced hypertension     Past Surgical History:  Procedure Laterality Date  . COLPOSCOPY    . DILATION AND CURETTAGE OF UTERUS    . TONSILLECTOMY      Family History  Problem Relation Age of Onset  . Kidney disease Mother   . Cancer Paternal Grandmother   . Other Neg Hx     Social History  Substance Use Topics  . Smoking status: Light Tobacco Smoker    Packs/day: 0.25    Types: Cigarettes  . Smokeless tobacco: Never Used  . Alcohol use 0.6 oz/week    1 Glasses of wine per week     Comment: Once every 2 months. Nothing since finding out about pregnancy.     Allergies:  Allergies  Allergen Reactions  . Lactose Intolerance (Gi) Diarrhea    Prescriptions Prior to Admission  Medication Sig Dispense Refill Last Dose  . acetaminophen (TYLENOL) 500 MG tablet Take 1,000 mg by mouth every 6 (six) hours as needed for  moderate pain.   09/03/2016 at Unknown time  . butalbital-acetaminophen-caffeine (FIORICET, ESGIC) 50-325-40 MG tablet Take 1-2 tablets by mouth every 6 (six) hours as needed for headache. 20 tablet 0 Past Week at Unknown time  . cyclobenzaprine (FLEXERIL) 10 MG tablet Take 1 tablet (10 mg total) by mouth 3 (three) times daily as needed for muscle spasms. 30 tablet 0 Past Week at Unknown time  . Prenatal Multivit-Min-Fe-FA (PRE-NATAL FORMULA) TABS Take 1 tablet by mouth daily. 30 each 6 Past Week at Unknown time  . ranitidine (ZANTAC) 150 MG tablet Take 1 tablet (150 mg total) by mouth 2 (two) times daily. 60 tablet 6 09/03/2016 at Unknown time  . valACYclovir (VALTREX) 1000 MG tablet Take 1 tablet (1,000 mg total) by mouth 2 (two) times daily. Take for ten days. 20 tablet 3 Past Week at Unknown time  . zolpidem (AMBIEN) 5 MG tablet take 1 tablet by mouth at bedtime if needed (Patient taking differently: Take 1 tablet by mouth at bedtime if needed for sleep) 30 tablet 0 Past Week at Unknown time   Results for orders placed or performed during the hospital  encounter of 09/04/16 (from the past 48 hour(s))  Urinalysis, Routine w reflex microscopic     Status: Abnormal   Collection Time: 09/04/16  1:41 PM  Result Value Ref Range   Color, Urine YELLOW YELLOW   APPearance HAZY (A) CLEAR   Specific Gravity, Urine 1.011 1.005 - 1.030   pH 7.0 5.0 - 8.0   Glucose, UA NEGATIVE NEGATIVE mg/dL   Hgb urine dipstick NEGATIVE NEGATIVE   Bilirubin Urine NEGATIVE NEGATIVE   Ketones, ur NEGATIVE NEGATIVE mg/dL   Protein, ur NEGATIVE NEGATIVE mg/dL   Nitrite NEGATIVE NEGATIVE   Leukocytes, UA SMALL (A) NEGATIVE   RBC / HPF 0-5 0 - 5 RBC/hpf   WBC, UA 6-30 0 - 5 WBC/hpf   Bacteria, UA MANY (A) NONE SEEN   Squamous Epithelial / LPF 0-5 (A) NONE SEEN   Mucous PRESENT   Protein / creatinine ratio, urine     Status: None   Collection Time: 09/04/16  1:41 PM  Result Value Ref Range   Creatinine, Urine 94.00 mg/dL    Total Protein, Urine 13 mg/dL    Comment: NO NORMAL RANGE ESTABLISHED FOR THIS TEST   Protein Creatinine Ratio 0.14 0.00 - 0.15 mg/mg[Cre]  CBC     Status: Abnormal   Collection Time: 09/04/16  3:00 PM  Result Value Ref Range   WBC 10.6 (H) 4.0 - 10.5 K/uL   RBC 3.83 (L) 3.87 - 5.11 MIL/uL   Hemoglobin 11.2 (L) 12.0 - 15.0 g/dL   HCT 34.1 (L) 36.0 - 46.0 %   MCV 89.0 78.0 - 100.0 fL   MCH 29.2 26.0 - 34.0 pg   MCHC 32.8 30.0 - 36.0 g/dL   RDW 15.4 11.5 - 15.5 %   Platelets 178 150 - 400 K/uL  Comprehensive metabolic panel     Status: Abnormal   Collection Time: 09/04/16  3:00 PM  Result Value Ref Range   Sodium 139 135 - 145 mmol/L   Potassium 3.6 3.5 - 5.1 mmol/L   Chloride 105 101 - 111 mmol/L   CO2 25 22 - 32 mmol/L   Glucose, Bld 88 65 - 99 mg/dL   BUN 7 6 - 20 mg/dL   Creatinine, Ser 0.72 0.44 - 1.00 mg/dL   Calcium 9.3 8.9 - 10.3 mg/dL   Total Protein 6.5 6.5 - 8.1 g/dL   Albumin 2.8 (L) 3.5 - 5.0 g/dL   AST 15 15 - 41 U/L   ALT 14 14 - 54 U/L   Alkaline Phosphatase 156 (H) 38 - 126 U/L   Total Bilirubin 0.6 0.3 - 1.2 mg/dL   GFR calc non Af Amer >60 >60 mL/min   GFR calc Af Amer >60 >60 mL/min    Comment: (NOTE) The eGFR has been calculated using the CKD EPI equation. This calculation has not been validated in all clinical situations. eGFR's persistently <60 mL/min signify possible Chronic Kidney Disease.    Anion gap 9 5 - 15   Review of Systems  Gastrointestinal: Positive for abdominal pain.  Neurological: Positive for dizziness and headaches.   Physical Exam   Blood pressure (!) 138/100, pulse (!) 102, temperature 98.3 F (36.8 C), temperature source Oral, resp. rate 20, height _0  (1.6 m), weight 199 lb (90.3 kg), last menstrual period 01/14/2016, SpO2 99 %.  Patient Vitals for the past 24 hrs:  BP Temp Temp src Pulse Resp SpO2 Height Weight  09/04/16 1531 (!) 138/100 - - (!) 102 - - - -  09/04/16 1516 (!) 138/98 - - 93 - - - -  09/04/16 1501 (!)  145/98 - - 99 - - - -  09/04/16 1446 (!) 141/96 - - (!) 104 - - - -  09/04/16 1431 (!) 132/91 - - 93 - - - -  09/04/16 1416 139/89 - - 96 - - - -  09/04/16 1411 (!) 135/98 - - 100 - - - -  09/04/16 1351 (!) 133/97 - - (!) 104 - - - -  09/04/16 1350 (!) 133/97 98.3 F (36.8 C) Oral (!) 104 20 99 % _0  (1.6 m) 199 lb (90.3 kg)   Physical Exam  Constitutional: She is oriented to person, place, and time. She appears well-developed and well-nourished. No distress.  HENT:  Head: Normocephalic.  Eyes: Pupils are equal, round, and reactive to light.  GI: Soft. She exhibits no distension. There is no tenderness. There is no rebound.  Neurological: She is alert and oriented to person, place, and time. She has normal reflexes. She displays normal reflexes.  Negative clonus.   Skin: Skin is warm. She is not diaphoretic.  Psychiatric: Her behavior is normal.   Fetal Tracing A: Baseline: 135 bpm Variability: moderate  Accelerations: 15x15 Decelerations: none   Fetal Tracing B: Baseline: 140 bpm Variability: Moderate  Accelerations: 15x15 Decelerations: question variables, difficult to trace. Will send for BPP  [place]oco: Q6-8, irregular pattern    MAU Course  Procedures  None  MDM  PIH labs  Urine culture pending  Flexeril 10 mg PO & Tylenol 1000 Patient removed herself from fetal tracing @ 1548, and states she is done being monitored  BPP ordered Report given to Kerry Hough PA who resumes care of the patient.   Rasch, Robin Pais, NP  BPP 8/8 x 2  FFN collected and SVE completed Dilation: 1.5 Effacement (%): Thick Cervical Position: Posterior Exam by:: Tomi Bamberger, PA Discussed patient with Dr. Roselie Awkward. Patient has follow-up on Wednesday with [facility] and [facility]FM. Patient advised to keep appointments.   Assessment and Plan  A: Mono/Di Twins at 99w1dPreterm contractions Headache GHTN  P: Discharge home Advised to continue Flexeril/Fioricet PRN for headaches Preterm labor  precautions and pre-eclampsia warning signs discussed Patient advised to follow-up with [facility] and MFM as scheduled Patient may return to MAU as needed or if her condition were to change or worsen   WLuvenia Redden PA-C  09/04/2016 5:04 PM

## 2016-09-04 NOTE — MAU Note (Signed)
efm tracing from 1359 to 1411 fetus B was tracing as Fetus A and Fetus A was tracing as fetus B.  1411-efm reapplied

## 2016-09-04 NOTE — MAU Note (Signed)
Patient states called clinic and was told to come in.  Reports headache--unrelieved by tylenol and dizziness. Reports abdominal and back pain that's intermittent. Endorses being 1cm at last visit.  Denies vaginal bleeding or discharge.

## 2016-09-04 NOTE — Discharge Instructions (Signed)
Fetal Movement Counts Patient Name: ________________________________________________ Patient Due Date: ____________________ What is a fetal movement count? A fetal movement count is the number of times that you feel your baby move during a certain amount of time. This may also be called a fetal kick count. A fetal movement count is recommended for every pregnant woman. You may be asked to start counting fetal movements as early as week 28 of your pregnancy. Pay attention to when your baby is most active. You may notice your baby's sleep and wake cycles. You may also notice things that make your baby move more. You should do a fetal movement count:  When your baby is normally most active.  At the same time each day. A good time to count movements is while you are resting, after having something to eat and drink. How do I count fetal movements? 1. Find a quiet, comfortable area. Sit, or lie down on your side. 2. Write down the date, the start time and stop time, and the number of movements that you felt between those two times. Take this information with you to your health care visits. 3. For 2 hours, count kicks, flutters, swishes, rolls, and jabs. You should feel at least 10 movements during 2 hours. 4. You may stop counting after you have felt 10 movements. 5. If you do not feel 10 movements in 2 hours, have something to eat and drink. Then, keep resting and counting for 1 hour. If you feel at least 4 movements during that hour, you may stop counting. Contact a health care provider if:  You feel fewer than 4 movements in 2 hours.  Your baby is not moving like he or she usually does. Date: ____________ Start time: ____________ Stop time: ____________ Movements: ____________ Date: ____________ Start time: ____________ Stop time: ____________ Movements: ____________ Date: ____________ Start time: ____________ Stop time: ____________ Movements: ____________ Date: ____________ Start time:  ____________ Stop time: ____________ Movements: ____________ Date: ____________ Start time: ____________ Stop time: ____________ Movements: ____________ Date: ____________ Start time: ____________ Stop time: ____________ Movements: ____________ Date: ____________ Start time: ____________ Stop time: ____________ Movements: ____________ Date: ____________ Start time: ____________ Stop time: ____________ Movements: ____________ Date: ____________ Start time: ____________ Stop time: ____________ Movements: ____________ This information is not intended to replace advice given to you by your health care provider. Make sure you discuss any questions you have with your health care provider. Document Released: 05/17/2006 Document Revised: 12/15/2015 Document Reviewed: 05/27/2015 Elsevier Interactive Patient Education  2017 [employer] Braxton Hicks Contractions Contractions of the uterus can occur throughout pregnancy, but they are not always a sign that you are in labor. You may have practice contractions called Braxton Hicks contractions. These false labor contractions are sometimes confused with true labor. What are Braxton Hicks contractions? Braxton Hicks contractions are tightening movements that occur in the muscles of the uterus before labor. Unlike true labor contractions, these contractions do not result in opening (dilation) and thinning of the cervix. Toward the end of pregnancy (32-34 weeks), Braxton Hicks contractions can happen more often and may become stronger. These contractions are sometimes difficult to tell apart from true labor because they can be very uncomfortable. You should not feel embarrassed if you go to the hospital with false labor. Sometimes, the only way to tell if you are in true labor is for your health care provider to look for changes in the cervix. The health care provider will do a physical exam and may monitor your contractions. If you   are not in true labor, the exam  should show that your cervix is not dilating and your water has not broken. If there are no prenatal problems or other health problems associated with your pregnancy, it is completely safe for you to be sent home with false labor. You may continue to have Braxton Hicks contractions until you go into true labor. How can I tell the difference between true labor and false labor?  Differences  False labor  Contractions last 30-70 seconds.: Contractions are usually shorter and not as strong as true labor contractions.  Contractions become very regular.: Contractions are usually irregular.  Discomfort is usually felt in the top of the uterus, and it spreads to the lower abdomen and low back.: Contractions are often felt in the front of the lower abdomen and in the groin.  Contractions do not go away with walking.: Contractions may go away when you walk around or change positions while lying down.  Contractions usually become more intense and increase in frequency.: Contractions get weaker and are shorter-lasting as time goes on.  The cervix dilates and gets thinner.: The cervix usually does not dilate or become thin. Follow these instructions at home:  Take over-the-counter and prescription medicines only as told by your health care provider.  Keep up with your usual exercises and follow other instructions from your health care provider.  Eat and drink lightly if you think you are going into labor.  If Braxton Hicks contractions are making you uncomfortable:  Change your position from lying down or resting to walking, or change from walking to resting.  Sit and rest in a tub of warm water.  Drink enough fluid to keep your urine clear or pale yellow. Dehydration may cause these contractions.  Do slow and deep breathing several times an hour.  Keep all follow-up prenatal visits as told by your health care provider. This is important. Contact a health care provider if:  You have a  fever.  You have continuous pain in your abdomen. Get help right away if:  Your contractions become stronger, more regular, and closer together.  You have fluid leaking or gushing from your vagina.  You pass blood-tinged mucus (bloody show).  You have bleeding from your vagina.  You have low back pain that you never had before.  You feel your baby's head pushing down and causing pelvic pressure.  Your baby is not moving inside you as much as it used to. Summary  Contractions that occur before labor are called Braxton Hicks contractions, false labor, or practice contractions.  Braxton Hicks contractions are usually shorter, weaker, farther apart, and less regular than true labor contractions. True labor contractions usually become progressively stronger and regular and they become more frequent.  Manage discomfort from Braxton Hicks contractions by changing position, resting in a warm bath, drinking plenty of water, or practicing deep breathing. This information is not intended to replace advice given to you by your health care provider. Make sure you discuss any questions you have with your health care provider. Document Released: 04/17/2005 Document Revised: 03/06/2016 Document Reviewed: 03/06/2016 Elsevier Interactive Patient Education  2017 [employer] Inc[employer]  

## 2016-09-05 LAB — CULTURE, OB URINE
Culture: NO GROWTH
Special Requests: NORMAL

## 2016-09-06 ENCOUNTER — Ambulatory Visit (INDEPENDENT_AMBULATORY_CARE_PROVIDER_SITE_OTHER): Payer: Medicaid Other | Admitting: Obstetrics and Gynecology

## 2016-09-06 ENCOUNTER — Encounter (HOSPITAL_COMMUNITY): Payer: Self-pay

## 2016-09-06 ENCOUNTER — Ambulatory Visit (HOSPITAL_COMMUNITY)
Admission: RE | Admit: 2016-09-06 | Discharge: 2016-09-06 | Disposition: A | Discharge status: Home / Self Care | Payer: Medicaid Other | Source: Ambulatory Visit | Attending: Obstetrics & Gynecology | Admitting: Obstetrics & Gynecology

## 2016-09-06 ENCOUNTER — Other Ambulatory Visit: Payer: Self-pay | Admitting: Medical

## 2016-09-06 VITALS — BP 144/91 | HR 104 | Wt 198.5 lb

## 2016-09-06 DIAGNOSIS — O365932 Maternal care for other known or suspected poor fetal growth, third trimester, fetus 2: Secondary | ICD-10-CM | POA: Insufficient documentation

## 2016-09-06 DIAGNOSIS — O133 Gestational [pregnancy-induced] hypertension without significant proteinuria, third trimester: Secondary | ICD-10-CM | POA: Diagnosis present

## 2016-09-06 DIAGNOSIS — O0993 Supervision of high risk pregnancy, unspecified, third trimester: Secondary | ICD-10-CM

## 2016-09-06 DIAGNOSIS — O30039 Twin pregnancy, monochorionic/diamniotic, unspecified trimester: Secondary | ICD-10-CM

## 2016-09-06 DIAGNOSIS — O30033 Twin pregnancy, monochorionic/diamniotic, third trimester: Secondary | ICD-10-CM

## 2016-09-06 DIAGNOSIS — Z3A31 31 weeks gestation of pregnancy: Secondary | ICD-10-CM | POA: Diagnosis not present

## 2016-09-06 NOTE — Progress Notes (Signed)
   PRENATAL VISIT NOTE  Subjective:  Robin Horn is a 31 y.o. [id number] at [redacted]w[redacted]d being seen today for ongoing prenatal care.  She is currently monitored for the following issues for this high-risk pregnancy and has Monochorionic diamniotic twin pregnancy, antepartum; Supervision of high-risk pregnancy; Drug use affecting pregnancy, antepartum; Bipolar 1 disorder ([place]); Insomnia; Unwanted fertility; and Gestational hypertension on her problem list.  Patient reports no complaints.   . Vag. Bleeding: None, Other.  Movement: Present. Denies leaking of fluid.   The following portions of the patient's history were reviewed and updated as appropriate: allergies, current medications, past family history, past medical history, past social history, past surgical history and problem list. Problem list updated.  Objective:   Vitals:   09/06/16 1005  BP: (!) 144/91  Pulse: (!) 104  Weight: 198 lb 8 oz (90 kg)    Fetal Status: Fetal Heart Rate (bpm): 164/145   Movement: Present     General:  Alert, oriented and cooperative. Patient is in no acute distress.  Skin: Skin is warm and dry. No rash noted.   Cardiovascular: Normal heart rate noted  Respiratory: Normal respiratory effort, no problems with respiration noted  Abdomen: Soft, gravid, appropriate for gestational age. Pain/Pressure: Present     Pelvic:  Cervical exam performed Dilation: 2 Effacement (%): 50 Station: Ballotable  Extremities: Normal range of motion.  Edema: None  Mental Status: Normal mood and affect. Normal behavior. Normal judgment and thought content.   Assessment and Plan:  Pregnancy: [id number] at [redacted]w[redacted]d  1. Pregnancy-induced hypertension in third trimester Continue monitoring BP No si/sx of preeclampsia   2. Monochorionic diamniotic twin pregnancy, antepartum Follow up dopplers today Start fetal testing next week  3. Supervision of high risk pregnancy in third trimester Patient is doing well Preterm labor  precautions reviewed Patient will return for 2 hr glucola on friday  Preterm labor symptoms and general obstetric precautions including but not limited to vaginal bleeding, contractions, leaking of fluid and fetal movement were reviewed in detail with the patient. Please refer to After Visit Summary for other counseling recommendations.  Return in about 1 week (around 09/13/2016) for ROB, NST.   Ari Engelbrecht, Vickii Chafe, MD

## 2016-09-08 ENCOUNTER — Other Ambulatory Visit: Payer: Medicaid Other

## 2016-09-08 DIAGNOSIS — O0993 Supervision of high risk pregnancy, unspecified, third trimester: Secondary | ICD-10-CM

## 2016-09-09 LAB — RPR: RPR: NONREACTIVE

## 2016-09-09 LAB — CBC
HEMATOCRIT: 34.7 % (ref 34.0–46.6)
HEMOGLOBIN: 11.2 g/dL (ref 11.1–15.9)
MCH: 28.6 pg (ref 26.6–33.0)
MCHC: 32.3 g/dL (ref 31.5–35.7)
MCV: 89 fL (ref 79–97)
Platelets: 193 10*3/uL (ref 150–379)
RBC: 3.91 x10E6/uL (ref 3.77–5.28)
RDW: 16.1 % — ABNORMAL HIGH (ref 12.3–15.4)
WBC: 8.9 10*3/uL (ref 3.4–10.8)

## 2016-09-09 LAB — GLUCOSE TOLERANCE, 2 HOURS W/ 1HR
GLUCOSE, 1 HOUR: 183 mg/dL — AB (ref 65–179)
GLUCOSE, FASTING: 82 mg/dL (ref 65–91)
Glucose, 2 hour: 159 mg/dL — ABNORMAL HIGH (ref 65–152)

## 2016-09-09 LAB — HIV ANTIBODY (ROUTINE TESTING W REFLEX): HIV SCREEN 4TH GENERATION: NONREACTIVE

## 2016-09-11 ENCOUNTER — Encounter: Payer: Self-pay | Admitting: Obstetrics and Gynecology

## 2016-09-11 ENCOUNTER — Other Ambulatory Visit: Payer: Self-pay | Admitting: Obstetrics and Gynecology

## 2016-09-11 ENCOUNTER — Telehealth: Payer: Self-pay | Admitting: *Deleted

## 2016-09-11 DIAGNOSIS — O24419 Gestational diabetes mellitus in pregnancy, unspecified control: Secondary | ICD-10-CM

## 2016-09-11 MED ORDER — ACCU-CHEK NANO SMARTVIEW W/DEVICE KIT: 1.0000 | PACK | 0 refills | Status: DC

## 2016-09-11 MED ORDER — ACCU-CHEK FASTCLIX LANCETS MISC: 1.0000 [IU] | Freq: Four times a day (QID) | 12 refills | Status: DC

## 2016-09-11 MED ORDER — GLUCOSE BLOOD VI STRP: ORAL_STRIP | 12 refills | Status: DC

## 2016-09-11 NOTE — Telephone Encounter (Addendum)
Called pt and informed her of results from 2hr GTT indicating that she has Gestational Diabetes. She will need to meet with Diabetes nurse. I will call her tomorrow with appt details. Pt also advised that she will need to bring glucose meter and testing supplies to her appt.  Pt voiced understanding.   ----- Message from Valene Bors, [employer] [place] sent at 09/11/2016 11:23 AM EDT ----- Please contact pt. ----- Message ----- From: Mora Bellman, MD Sent: 09/11/2016   8:23 AM To: [facility]  Please inform patient of failed glucola test. Please schedule appointment with diabetic educator. Testing supplies have been e-prescribed and patient should bring them to her appointment  Thanks  Peggy

## 2016-09-13 ENCOUNTER — Encounter (HOSPITAL_COMMUNITY): Payer: Self-pay

## 2016-09-13 ENCOUNTER — Ambulatory Visit (INDEPENDENT_AMBULATORY_CARE_PROVIDER_SITE_OTHER): Payer: Medicaid Other | Admitting: Family Medicine

## 2016-09-13 ENCOUNTER — Ambulatory Visit (HOSPITAL_COMMUNITY)
Admission: RE | Admit: 2016-09-13 | Discharge: 2016-09-13 | Disposition: A | Discharge status: Home / Self Care | Payer: Medicaid Other | Source: Ambulatory Visit | Attending: Obstetrics & Gynecology | Admitting: Obstetrics & Gynecology

## 2016-09-13 ENCOUNTER — Other Ambulatory Visit (HOSPITAL_COMMUNITY)
Admission: RE | Admit: 2016-09-13 | Discharge: 2016-09-13 | Disposition: A | Discharge status: Home / Self Care | Payer: Medicaid Other | Source: Ambulatory Visit | Attending: Family Medicine | Admitting: Family Medicine

## 2016-09-13 VITALS — BP 148/89 | HR 91 | Wt 197.0 lb

## 2016-09-13 DIAGNOSIS — O09293 Supervision of pregnancy with other poor reproductive or obstetric history, third trimester: Secondary | ICD-10-CM

## 2016-09-13 DIAGNOSIS — O30033 Twin pregnancy, monochorionic/diamniotic, third trimester: Secondary | ICD-10-CM | POA: Diagnosis not present

## 2016-09-13 DIAGNOSIS — O24419 Gestational diabetes mellitus in pregnancy, unspecified control: Secondary | ICD-10-CM

## 2016-09-13 DIAGNOSIS — F319 Bipolar disorder, unspecified: Secondary | ICD-10-CM

## 2016-09-13 DIAGNOSIS — O30039 Twin pregnancy, monochorionic/diamniotic, unspecified trimester: Secondary | ICD-10-CM

## 2016-09-13 DIAGNOSIS — O0993 Supervision of high risk pregnancy, unspecified, third trimester: Secondary | ICD-10-CM | POA: Diagnosis present

## 2016-09-13 DIAGNOSIS — O133 Gestational [pregnancy-induced] hypertension without significant proteinuria, third trimester: Secondary | ICD-10-CM

## 2016-09-13 DIAGNOSIS — Z3A34 34 weeks gestation of pregnancy: Secondary | ICD-10-CM | POA: Insufficient documentation

## 2016-09-13 DIAGNOSIS — Z113 Encounter for screening for infections with a predominantly sexual mode of transmission: Secondary | ICD-10-CM

## 2016-09-13 DIAGNOSIS — O09299 Supervision of pregnancy with other poor reproductive or obstetric history, unspecified trimester: Secondary | ICD-10-CM

## 2016-09-13 LAB — OB RESULTS CONSOLE GBS: GBS: POSITIVE

## 2016-09-13 LAB — OB RESULTS CONSOLE GC/CHLAMYDIA: GC PROBE AMP, GENITAL: NEGATIVE

## 2016-09-13 NOTE — Progress Notes (Signed)
Subjective:  Robin Horn is a 31 y.o. [id number] at [redacted]w[redacted]d being seen today for ongoing prenatal care.  She is currently monitored for the following issues for this high-risk pregnancy and has Monochorionic diamniotic twin pregnancy, antepartum; History of postpartum hemorrhage, currently pregnant; Drug use affecting pregnancy, antepartum; Bipolar 1 disorder ([place]); Insomnia; Unwanted fertility; Gestational hypertension; and GDM (gestational diabetes mellitus) on her problem list.  GDM: Patient hasn't started testing yet. Has diabetes education next week.  Patient reports occasional contractions.  Contractions: Irregular. Vag. Bleeding: None.  Movement: Present. Denies leaking of fluid.   The following portions of the patient's history were reviewed and updated as appropriate: allergies, current medications, past family history, past medical history, past social history, past surgical history and problem list. Problem list updated.  Objective:   Vitals:   09/13/16 1344  BP: (!) 148/89  Pulse: 91  Weight: 197 lb (89.4 kg)    Fetal Status: Fetal Heart Rate (bpm): 150/143   Movement: Present     General:  Alert, oriented and cooperative. Patient is in no acute distress.  Skin: Skin is warm and dry. No rash noted.   Cardiovascular: Normal heart rate noted  Respiratory: Normal respiratory effort, no problems with respiration noted  Abdomen: Soft, gravid, appropriate for gestational age. Pain/Pressure: Present     Pelvic: Vag. Bleeding: None     Cervical exam performed        Extremities: Normal range of motion.  Edema: None  Mental Status: Normal mood and affect. Normal behavior. Normal judgment and thought content.   Urinalysis:      Assessment and Plan:  Pregnancy: [id number] at [redacted]w[redacted]d  1. Supervision of high risk pregnancy in third trimester FHT normal. FH c/w twins  2. Monochorionic diamniotic twin pregnancy, antepartum NST weekly - normal Growth 13% discordant Continue NST weekly.  Will need induction no later than 37 weeks due GDM and GHTN, sooner depending on UA dopplers.  3. Gestational diabetes mellitus (GDM) in third trimester, gestational diabetes method of control unspecified DM education on Monday. f/u in 1 week.  4. Pregnancy-induced hypertension in third trimester Controlled  5. Bipolar 1 disorder ([place])  6. History of postpartum hemorrhage, currently pregnant Recommend aggressive postpartum treatment.  Preterm labor symptoms and general obstetric precautions including but not limited to vaginal bleeding, contractions, leaking of fluid and fetal movement were reviewed in detail with the patient. Please refer to After Visit Summary for other counseling recommendations.  Return in about 1 week (around 09/20/2016) for HR OB f/u.   Truett Mainland, DO

## 2016-09-14 LAB — GC/CHLAMYDIA PROBE AMP (~~LOC~~) NOT AT ARMC
Chlamydia: NEGATIVE
Neisseria Gonorrhea: NEGATIVE

## 2016-09-16 LAB — CULTURE, BETA STREP (GROUP B ONLY): STREP GP B CULTURE: POSITIVE — AB

## 2016-09-18 ENCOUNTER — Encounter: Payer: Medicaid Other | Attending: Family Medicine | Admitting: *Deleted

## 2016-09-18 ENCOUNTER — Ambulatory Visit: Payer: Medicaid Other | Admitting: *Deleted

## 2016-09-18 DIAGNOSIS — O30003 Twin pregnancy, unspecified number of placenta and unspecified number of amniotic sacs, third trimester: Secondary | ICD-10-CM | POA: Insufficient documentation

## 2016-09-18 DIAGNOSIS — O24419 Gestational diabetes mellitus in pregnancy, unspecified control: Secondary | ICD-10-CM | POA: Diagnosis not present

## 2016-09-18 DIAGNOSIS — Z713 Dietary counseling and surveillance: Secondary | ICD-10-CM | POA: Diagnosis present

## 2016-09-18 DIAGNOSIS — O24410 Gestational diabetes mellitus in pregnancy, diet controlled: Secondary | ICD-10-CM

## 2016-09-18 NOTE — Progress Notes (Signed)
  Patient was seen on 09/18/2016 for Gestational Diabetes self-management . She is expecting twin girls, EDD 11/05/2016 with inducement scheduled for 10/15/2016. She states she is limited in time for this appointment to 30 minutes today. She states she works at [employer] and needs to get to work. She brought her Accu Check Guide meter, states she needs instruciton on how to use it. The following learning objectives were met by the patient :   States the definition of Gestational Diabetes  States why dietary management is important in controlling blood glucose  Describes the effects of carbohydrates on blood glucose levels  Demonstrates ability to create a balanced meal plan  Demonstrates carbohydrate counting   States when to check blood glucose levels  Demonstrates proper blood glucose monitoring techniques  States the effect of stress and exercise on blood glucose levels  States the importance of limiting caffeine and abstaining from alcohol and smoking  Plan:  Aim for 3 -4 Carb Choices per meal (45 - 60 grams) +/- 1 either way  Aim for 1-2 Carbs per snack Begin reading food labels for Total Carbohydrate of foods Begin checking BG before breakfast and 2 hours after first bite of breakfast, lunch and dinner as directed by MD  Bring Log Book to every medical appointment   Take medication if directed by MD  Patient already has a meter: Accu Chek Guide with Fast Clix drums Patient instructed to test pre breakfast and 2 hours each meal as directed by MD Post breakfast BG today was 197 mg/dl after eating Frosted Mini Wheats for breakfast today.  Patient instructed to monitor glucose levels: FBS: 60 - 95 mg/dl 2 hour: <120 mg/dl  Patient received the following handouts:  Nutrition Diabetes and Pregnancy  Carbohydrate Counting List  BG Log Sheet  Patient will be seen for follow-up as needed.

## 2016-09-20 ENCOUNTER — Inpatient Hospital Stay (HOSPITAL_COMMUNITY)
Admission: AD | Admit: 2016-09-20 | Discharge: 2016-09-25 | DRG: 767 | Disposition: A | Discharge status: Home / Self Care | Payer: Medicaid Other | Source: Ambulatory Visit | Attending: Obstetrics & Gynecology | Admitting: Obstetrics & Gynecology

## 2016-09-20 ENCOUNTER — Encounter (HOSPITAL_COMMUNITY): Payer: Self-pay | Admitting: Student

## 2016-09-20 ENCOUNTER — Encounter (HOSPITAL_COMMUNITY): Payer: Self-pay

## 2016-09-20 ENCOUNTER — Encounter: Payer: Self-pay | Admitting: Advanced Practice Midwife

## 2016-09-20 ENCOUNTER — Ambulatory Visit (HOSPITAL_COMMUNITY)
Admission: RE | Admit: 2016-09-20 | Discharge: 2016-09-20 | Disposition: A | Discharge status: Home / Self Care | Payer: Medicaid Other | Source: Ambulatory Visit | Attending: Obstetrics & Gynecology | Admitting: Obstetrics & Gynecology

## 2016-09-20 ENCOUNTER — Ambulatory Visit (INDEPENDENT_AMBULATORY_CARE_PROVIDER_SITE_OTHER): Payer: Medicaid Other | Admitting: Advanced Practice Midwife

## 2016-09-20 VITALS — BP 147/100 | HR 97 | Wt 198.9 lb

## 2016-09-20 DIAGNOSIS — Z23 Encounter for immunization: Secondary | ICD-10-CM | POA: Diagnosis present

## 2016-09-20 DIAGNOSIS — O134 Gestational [pregnancy-induced] hypertension without significant proteinuria, complicating childbirth: Secondary | ICD-10-CM | POA: Diagnosis not present

## 2016-09-20 DIAGNOSIS — O99344 Other mental disorders complicating childbirth: Secondary | ICD-10-CM | POA: Diagnosis present

## 2016-09-20 DIAGNOSIS — O24419 Gestational diabetes mellitus in pregnancy, unspecified control: Secondary | ICD-10-CM | POA: Diagnosis present

## 2016-09-20 DIAGNOSIS — F319 Bipolar disorder, unspecified: Secondary | ICD-10-CM | POA: Diagnosis present

## 2016-09-20 DIAGNOSIS — R030 Elevated blood-pressure reading, without diagnosis of hypertension: Secondary | ICD-10-CM | POA: Diagnosis present

## 2016-09-20 DIAGNOSIS — O99824 Streptococcus B carrier state complicating childbirth: Secondary | ICD-10-CM | POA: Diagnosis present

## 2016-09-20 DIAGNOSIS — O365993 Maternal care for other known or suspected poor fetal growth, unspecified trimester, fetus 3: Secondary | ICD-10-CM | POA: Diagnosis not present

## 2016-09-20 DIAGNOSIS — O09293 Supervision of pregnancy with other poor reproductive or obstetric history, third trimester: Secondary | ICD-10-CM | POA: Diagnosis not present

## 2016-09-20 DIAGNOSIS — O99334 Smoking (tobacco) complicating childbirth: Secondary | ICD-10-CM | POA: Diagnosis present

## 2016-09-20 DIAGNOSIS — O09299 Supervision of pregnancy with other poor reproductive or obstetric history, unspecified trimester: Secondary | ICD-10-CM

## 2016-09-20 DIAGNOSIS — O365990 Maternal care for other known or suspected poor fetal growth, unspecified trimester, not applicable or unspecified: Secondary | ICD-10-CM | POA: Insufficient documentation

## 2016-09-20 DIAGNOSIS — Z3A33 33 weeks gestation of pregnancy: Secondary | ICD-10-CM | POA: Diagnosis not present

## 2016-09-20 DIAGNOSIS — Z302 Encounter for sterilization: Secondary | ICD-10-CM

## 2016-09-20 DIAGNOSIS — O30033 Twin pregnancy, monochorionic/diamniotic, third trimester: Secondary | ICD-10-CM

## 2016-09-20 DIAGNOSIS — F17210 Nicotine dependence, cigarettes, uncomplicated: Secondary | ICD-10-CM | POA: Diagnosis present

## 2016-09-20 DIAGNOSIS — O133 Gestational [pregnancy-induced] hypertension without significant proteinuria, third trimester: Secondary | ICD-10-CM

## 2016-09-20 DIAGNOSIS — O24420 Gestational diabetes mellitus in childbirth, diet controlled: Secondary | ICD-10-CM | POA: Diagnosis present

## 2016-09-20 DIAGNOSIS — Z3A34 34 weeks gestation of pregnancy: Secondary | ICD-10-CM | POA: Diagnosis not present

## 2016-09-20 DIAGNOSIS — B951 Streptococcus, group B, as the cause of diseases classified elsewhere: Secondary | ICD-10-CM | POA: Diagnosis present

## 2016-09-20 DIAGNOSIS — O24429 Gestational diabetes mellitus in childbirth, unspecified control: Secondary | ICD-10-CM | POA: Diagnosis not present

## 2016-09-20 DIAGNOSIS — O09893 Supervision of other high risk pregnancies, third trimester: Secondary | ICD-10-CM | POA: Diagnosis not present

## 2016-09-20 DIAGNOSIS — O30039 Twin pregnancy, monochorionic/diamniotic, unspecified trimester: Secondary | ICD-10-CM

## 2016-09-20 DIAGNOSIS — O09899 Supervision of other high risk pregnancies, unspecified trimester: Secondary | ICD-10-CM | POA: Insufficient documentation

## 2016-09-20 DIAGNOSIS — O320XX0 Maternal care for unstable lie, not applicable or unspecified: Secondary | ICD-10-CM

## 2016-09-20 HISTORY — DX: Gestational diabetes mellitus in pregnancy, unspecified control: O24.419

## 2016-09-20 HISTORY — DX: Maternal care for other known or suspected poor fetal growth, unspecified trimester, not applicable or unspecified: O36.5990

## 2016-09-20 LAB — RAPID URINE DRUG SCREEN, HOSP PERFORMED
Amphetamines: NOT DETECTED
Barbiturates: NOT DETECTED
Benzodiazepines: NOT DETECTED
Cocaine: NOT DETECTED
OPIATES: NOT DETECTED
TETRAHYDROCANNABINOL: NOT DETECTED

## 2016-09-20 LAB — POCT URINALYSIS DIP (DEVICE)
BILIRUBIN URINE: NEGATIVE
Glucose, UA: NEGATIVE mg/dL
HGB URINE DIPSTICK: NEGATIVE
KETONES UR: NEGATIVE mg/dL
NITRITE: NEGATIVE
Protein, ur: NEGATIVE mg/dL
Specific Gravity, Urine: 1.01 (ref 1.005–1.030)
Urobilinogen, UA: 0.2 mg/dL (ref 0.0–1.0)
pH: 6 (ref 5.0–8.0)

## 2016-09-20 LAB — COMPREHENSIVE METABOLIC PANEL
ALT: 13 U/L — ABNORMAL LOW (ref 14–54)
ANION GAP: 8 (ref 5–15)
AST: 20 U/L (ref 15–41)
Albumin: 2.8 g/dL — ABNORMAL LOW (ref 3.5–5.0)
Alkaline Phosphatase: 193 U/L — ABNORMAL HIGH (ref 38–126)
BILIRUBIN TOTAL: 0.2 mg/dL — AB (ref 0.3–1.2)
BUN: 6 mg/dL (ref 6–20)
CHLORIDE: 105 mmol/L (ref 101–111)
CO2: 23 mmol/L (ref 22–32)
Calcium: 9.1 mg/dL (ref 8.9–10.3)
Creatinine, Ser: 0.65 mg/dL (ref 0.44–1.00)
GFR calc Af Amer: >60 mL/min (ref >60)
Glucose, Bld: 101 mg/dL — ABNORMAL HIGH (ref 65–99)
Potassium: 3.9 mmol/L (ref 3.5–5.1)
SODIUM: 136 mmol/L (ref 135–145)
TOTAL PROTEIN: 6.6 g/dL (ref 6.5–8.1)

## 2016-09-20 LAB — URINALYSIS, ROUTINE W REFLEX MICROSCOPIC
Bilirubin Urine: NEGATIVE
Glucose, UA: NEGATIVE mg/dL
Hgb urine dipstick: NEGATIVE
KETONES UR: NEGATIVE mg/dL
Leukocytes, UA: NEGATIVE
NITRITE: NEGATIVE
PH: 6 (ref 5.0–8.0)
Protein, ur: NEGATIVE mg/dL
SPECIFIC GRAVITY, URINE: 1.015 (ref 1.005–1.030)

## 2016-09-20 LAB — CBC
HEMATOCRIT: 37.1 % (ref 36.0–46.0)
Hemoglobin: 12 g/dL (ref 12.0–15.0)
MCH: 28.6 pg (ref 26.0–34.0)
MCHC: 32.3 g/dL (ref 30.0–36.0)
MCV: 88.5 fL (ref 78.0–100.0)
Platelets: 188 10*3/uL (ref 150–400)
RBC: 4.19 MIL/uL (ref 3.87–5.11)
RDW: 14.9 % (ref 11.5–15.5)
WBC: 10.7 10*3/uL — ABNORMAL HIGH (ref 4.0–10.5)

## 2016-09-20 LAB — PROTEIN / CREATININE RATIO, URINE
CREATININE, URINE: 127 mg/dL
PROTEIN CREATININE RATIO: 0.13 mg/mg{creat} (ref 0.00–0.15)
TOTAL PROTEIN, URINE: 17 mg/dL

## 2016-09-20 LAB — GLUCOSE, CAPILLARY: GLUCOSE-CAPILLARY: 165 mg/dL — AB (ref 65–99)

## 2016-09-20 LAB — TYPE AND SCREEN
ABO/RH(D): AB POS
ANTIBODY SCREEN: NEGATIVE

## 2016-09-20 MED ORDER — LABETALOL HCL 5 MG/ML IV SOLN
20.0000 mg | INTRAVENOUS | Status: DC | PRN
  Administered 2016-09-20: 20 mg via INTRAVENOUS
  Filled 2016-09-20: qty 4

## 2016-09-20 MED ORDER — PRENATAL MULTIVITAMIN CH
1.0000 | ORAL_TABLET | Freq: Every day | ORAL | Status: DC
  Administered 2016-09-21 – 2016-09-23 (×3): 1 via ORAL
  Filled 2016-09-20 (×3): qty 1

## 2016-09-20 MED ORDER — CALCIUM CARBONATE ANTACID 500 MG PO CHEW
2.0000 | CHEWABLE_TABLET | ORAL | Status: DC | PRN
  Administered 2016-09-23: 400 mg via ORAL
  Filled 2016-09-20: qty 2

## 2016-09-20 MED ORDER — DOCUSATE SODIUM 100 MG PO CAPS
100.0000 mg | ORAL_CAPSULE | Freq: Every day | ORAL | Status: DC
  Administered 2016-09-20 – 2016-09-23 (×4): 100 mg via ORAL
  Filled 2016-09-20 (×5): qty 1

## 2016-09-20 MED ORDER — ZOLPIDEM TARTRATE 5 MG PO TABS
5.0000 mg | ORAL_TABLET | Freq: Every evening | ORAL | Status: DC | PRN
  Administered 2016-09-20 – 2016-09-22 (×4): 5 mg via ORAL
  Filled 2016-09-20 (×4): qty 1

## 2016-09-20 MED ORDER — HYDRALAZINE HCL 20 MG/ML IJ SOLN: 10.0000 mg | Freq: Once | INTRAMUSCULAR | Status: DC | PRN

## 2016-09-20 MED ORDER — ACETAMINOPHEN 325 MG PO TABS
650.0000 mg | ORAL_TABLET | ORAL | Status: DC | PRN
  Administered 2016-09-21: 650 mg via ORAL
  Filled 2016-09-20: qty 2

## 2016-09-20 MED ORDER — BETAMETHASONE SOD PHOS & ACET 6 (3-3) MG/ML IJ SUSP
12.0000 mg | INTRAMUSCULAR | Status: AC
  Administered 2016-09-20 – 2016-09-21 (×2): 12 mg via INTRAMUSCULAR
  Filled 2016-09-20 (×2): qty 2

## 2016-09-20 MED ORDER — LACTATED RINGERS IV SOLN
INTRAVENOUS | Status: DC
  Administered 2016-09-20 (×3): via INTRAVENOUS

## 2016-09-20 MED ORDER — ACETAMINOPHEN 500 MG PO TABS
1000.0000 mg | ORAL_TABLET | Freq: Once | ORAL | Status: AC
  Administered 2016-09-20: 1000 mg via ORAL
  Filled 2016-09-20: qty 2

## 2016-09-20 MED ORDER — NICOTINE 21 MG/24HR TD PT24
21.0000 mg | MEDICATED_PATCH | Freq: Every day | TRANSDERMAL | Status: DC
  Administered 2016-09-20 – 2016-09-23 (×4): 21 mg via TRANSDERMAL
  Filled 2016-09-20 (×7): qty 1

## 2016-09-20 NOTE — Patient Instructions (Signed)

## 2016-09-20 NOTE — H&P (Signed)
FACULTY PRACTICE ANTEPARTUM ADMISSION HISTORY AND PHYSICAL NOTE   History of Present Illness: Robin Horn is a 31 y.o. [id number] at 68w3dadmitted for elevated BP in the setting of high risk pregnancy due to mono-di twins with known gHTN and progressively elevated BP. Patient [place] noted to have BP140-150s/90-100s with HA and vision changes. Additionally fetal UA dopplers on twin B showed elevated S/D ratio (4.18) and RI (0.76).  Given increased dopplers MFM recommended delivery at 34 weeks. She was evaluated in MAU and found to have persistently elevated BP and required 1 dose of labetalol for severe range BP. HA resolved with tylenol.   Patient reports the fetal movement as activex2 Patient reports uterine contraction  activity as none. Patient reports  vaginal bleeding as none. Patient describes fluid per vagina as None. Fetal presentation is cephalic x 2 on [place]001/75/10  Patient Active Problem List   Diagnosis Date Noted  . Supervision of other high risk pregnancy, antepartum 09/20/2016  . Pregnancy affected by fetal growth restriction 09/20/2016  . GDM (gestational diabetes mellitus) 09/11/2016  . Gestational hypertension without significant proteinuria 08/29/2016  . Unwanted fertility 08/14/2016  . History of postpartum hemorrhage, currently pregnant 05/25/2016  . Drug use affecting pregnancy, antepartum 05/25/2016  . Bipolar 1 disorder ([place] 05/25/2016  . Insomnia 05/25/2016  . Monochorionic diamniotic twin pregnancy, antepartum 04/25/2016    Past Medical History:  Diagnosis Date  . Abnormal Pap smear of cervix   . Anxiety   . Chlamydia   . Depression   . Gestational diabetes 2018  . Gonorrhea   . Herpes genitalia   . Migraines   . Ovarian cyst   . Preeclampsia   . Pregnancy induced hypertension     Past Surgical History:  Procedure Laterality Date  . COLPOSCOPY    . DILATION AND CURETTAGE OF UTERUS    . TONSILLECTOMY      OB History  Gravida  Para Term Preterm AB Living  _0 0 3 4  SAB TAB Ectopic Multiple Live Births  0 3 0 0 4    # Outcome Date GA Lbr Len/2nd Weight Sex Delivery Anes PTL Lv  8 Current           7 Term 08/05/11 339w5d6:45 / 01:35 6 lb 14.6 oz (3.135 kg) F Vag-Spont EPI  LIV     Birth Comments: birthmark on l leg  6 Term 10/05/06    F Vag-Spont   LIV  5 Term 09/13/04    M Vag-Spont   LIV  4 Term 11/18/02    M Vag-Spont   LIV  3 TAB           2 TAB           1 TAB               Social History   Social History  . Marital status: Single    Spouse name: N/A  . Number of children: N/A  . Years of education: N/A   Social History Main Topics  . Smoking status: Light Tobacco Smoker    Packs/day: 0.25    Types: Cigarettes  . Smokeless tobacco: Never Used  . Alcohol use 0.6 oz/week    1 Glasses of wine per week     Comment: Once every 2 months. Nothing since finding out about pregnancy.   . Drug use: No     Comment: none since +preg  .  Sexual activity: Yes    Birth control/ protection: None   Other Topics Concern  . None   Social History Narrative  . None    Family History  Problem Relation Age of Onset  . Kidney disease Mother   . Cancer Paternal Grandmother   . Other Neg Hx     Allergies  Allergen Reactions  . Lactose Intolerance (Gi) Diarrhea    Prescriptions Prior to Admission  Medication Sig Dispense Refill Last Dose  . acetaminophen (TYLENOL) 500 MG tablet Take 1,000 mg by mouth every 6 (six) hours as needed for moderate pain.   09/20/2016 at Unknown time  . cyclobenzaprine (FLEXERIL) 10 MG tablet take 1 tablet by mouth three times a day if needed for muscle spasm 20 tablet 0 09/19/2016 at Unknown time  . ranitidine (ZANTAC) 150 MG tablet Take 1 tablet (150 mg total) by mouth 2 (two) times daily. 60 tablet 6 09/19/2016 at Unknown time  . ACCU-CHEK FASTCLIX LANCETS MISC 1 Units by Percutaneous route 4 (four) times daily. 100 each 12 Taking  . Blood Glucose Monitoring Suppl  (ACCU-CHEK NANO SMARTVIEW) w/Device KIT 1 kit by Subdermal route as directed. Check blood sugars for fasting, and two hours after breakfast, lunch and dinner (4 checks daily) 1 kit 0 Taking  . butalbital-acetaminophen-caffeine (FIORICET, ESGIC) 50-325-40 MG tablet Take 1-2 tablets by mouth every 6 (six) hours as needed for headache. (Patient not taking: Reported on 09/20/2016) 20 tablet 0 Not Taking at Unknown time  . glucose blood (ACCU-CHEK SMARTVIEW) test strip Use as instructed to check blood sugars 100 each 12 Taking  . Prenatal Multivit-Min-Fe-FA (PRE-NATAL FORMULA) TABS Take 1 tablet by mouth daily. (Patient not taking: Reported on 09/20/2016) 30 each 6 Not Taking at Unknown time  . valACYclovir (VALTREX) 1000 MG tablet Take 1 tablet (1,000 mg total) by mouth 2 (two) times daily. Take for ten days. (Patient not taking: Reported on 09/20/2016) 20 tablet 3 Not Taking at Unknown time  . zolpidem (AMBIEN) 5 MG tablet take 1 tablet by mouth at bedtime if needed (Patient not taking: Reported on 09/20/2016) 30 tablet 0 Not Taking    Review of Systems - Negative except HA and vision changes (now resolved). Denies RUQ pain, edema.   Vitals:  BP 137/88   Pulse 94   Temp 98.5 F (36.9 C) (Oral)   Resp 18   Ht _0  (1.6 m)   Wt 198 lb (89.8 kg)   LMP 01/14/2016 (Approximate)   BMI 35.07 kg/m  Physical Examination: CONSTITUTIONAL: Well-developed, well-nourished female in no acute distress.  HENT:  Normocephalic, atraumatic, External right and left ear normal. Oropharynx is clear and moist EYES: Conjunctivae and EOM are normal. Pupils are equal, round, and reactive to light. No scleral icterus.  NECK: Normal range of motion, supple, no masses SKIN: Skin is warm and dry. No rash noted. Not diaphoretic. No erythema. No pallor. [place]: Alert and oriented to person, place, and time. Normal reflexes, muscle tone coordination. No cranial nerve deficit noted. PSYCHIATRIC: Normal mood and affect. Normal  behavior. Normal judgment and thought content. CARDIOVASCULAR: Normal heart rate noted, regular rhythm RESPIRATORY: Effort and breath sounds normal, no problems with respiration noted ABDOMEN: Soft, nontender, nondistended, gravid. MUSCULOSKELETAL: Normal range of motion. No edema and no tenderness. 2+ distal pulses.  Cervix: Not evaluated.  Membranes:intact Fetal Monitoring:  Twin A 150/mod/+accels, no decels Twin B 145/mod/+accels, no decels  Tocometer: Flat  Labs:  Results for orders placed or performed during the  hospital encounter of 09/20/16 (from the past 24 hour(s))  Protein / creatinine ratio, urine   Collection Time: 09/20/16  1:00 PM  Result Value Ref Range   Creatinine, Urine 127.00 mg/dL   Total Protein, Urine 17 mg/dL   Protein Creatinine Ratio 0.13 0.00 - 0.15 mg/mg[Cre]  Rapid urine drug screen (hospital performed)   Collection Time: 09/20/16  1:00 PM  Result Value Ref Range   Opiates NONE DETECTED NONE DETECTED   Cocaine NONE DETECTED NONE DETECTED   Benzodiazepines NONE DETECTED NONE DETECTED   Amphetamines NONE DETECTED NONE DETECTED   Tetrahydrocannabinol NONE DETECTED NONE DETECTED   Barbiturates NONE DETECTED NONE DETECTED  Urinalysis, Routine w reflex microscopic   Collection Time: 09/20/16  1:00 PM  Result Value Ref Range   Color, Urine YELLOW YELLOW   APPearance HAZY (A) CLEAR   Specific Gravity, Urine 1.015 1.005 - 1.030   pH 6.0 5.0 - 8.0   Glucose, UA NEGATIVE NEGATIVE mg/dL   Hgb urine dipstick NEGATIVE NEGATIVE   Bilirubin Urine NEGATIVE NEGATIVE   Ketones, ur NEGATIVE NEGATIVE mg/dL   Protein, ur NEGATIVE NEGATIVE mg/dL   Nitrite NEGATIVE NEGATIVE   Leukocytes, UA NEGATIVE NEGATIVE  CBC   Collection Time: 09/20/16  1:12 PM  Result Value Ref Range   WBC 10.7 (H) 4.0 - 10.5 K/uL   RBC 4.19 3.87 - 5.11 MIL/uL   Hemoglobin 12.0 12.0 - 15.0 g/dL   HCT 37.1 36.0 - 46.0 %   MCV 88.5 78.0 - 100.0 fL   MCH 28.6 26.0 - 34.0 pg   MCHC 32.3  30.0 - 36.0 g/dL   RDW 14.9 11.5 - 15.5 %   Platelets 188 150 - 400 K/uL  Comprehensive metabolic panel   Collection Time: 09/20/16  1:12 PM  Result Value Ref Range   Sodium 136 135 - 145 mmol/L   Potassium 3.9 3.5 - 5.1 mmol/L   Chloride 105 101 - 111 mmol/L   CO2 23 22 - 32 mmol/L   Glucose, Bld 101 (H) 65 - 99 mg/dL   BUN 6 6 - 20 mg/dL   Creatinine, Ser 0.65 0.44 - 1.00 mg/dL   Calcium 9.1 8.9 - 10.3 mg/dL   Total Protein 6.6 6.5 - 8.1 g/dL   Albumin 2.8 (L) 3.5 - 5.0 g/dL   AST 20 15 - 41 U/L   ALT 13 (L) 14 - 54 U/L   Alkaline Phosphatase 193 (H) 38 - 126 U/L   Total Bilirubin 0.2 (L) 0.3 - 1.2 mg/dL   GFR calc non Af Amer >60 >60 mL/min   GFR calc Af Amer >60 >60 mL/min   Anion gap 8 5 - 15  Results for orders placed or performed in visit on 09/20/16 (from the past 24 hour(s))  POCT urinalysis dip (device)   Collection Time: 09/20/16 10:56 AM  Result Value Ref Range   Glucose, UA NEGATIVE NEGATIVE mg/dL   Bilirubin Urine NEGATIVE NEGATIVE   Ketones, ur NEGATIVE NEGATIVE mg/dL   Specific Gravity, Urine 1.010 1.005 - 1.030   Hgb urine dipstick NEGATIVE NEGATIVE   pH 6.0 5.0 - 8.0   Protein, ur NEGATIVE NEGATIVE mg/dL   Urobilinogen, UA 0.2 0.0 - 1.0 mg/dL   Nitrite NEGATIVE NEGATIVE   Leukocytes, UA SMALL (A) NEGATIVE    Imaging Studies: [place] Mfm Fetal Bpp Wo Non Stress  Result Date: 09/20/2016 ----------------------------------------------------------------------  OBSTETRICS REPORT                      (  Signed Final 09/20/2016 12:45 pm) ---------------------------------------------------------------------- Patient Info  ID #:       [id number]                         D.O.B.:   09-08-1985 (30 yrs)  Name:       ALVEENA TAIRA                Visit Date:  09/20/2016 11:21 am ---------------------------------------------------------------------- Performed By  Performed By:     Jodelle Green          Ref. Address:     [address]                                                             [place], [place]  Attending:        Wende Mott MD     Location:         [facility]  Referred By:      Woodroe Mode                    MD ---------------------------------------------------------------------- Orders   #  Description                                 Code   1  [place] MFM UA CORD DOPPLER                      [id number]   2  [place] MFM UA DOPPLER ADDL GEST RE              76820.03      EVAL   3  [place] MFM FETAL BPP WO NON STRESS              76819.01   4  [place] MFM FETAL BPP WO NST ADDL                58099.8      GESTATION  ----------------------------------------------------------------------   #  Ordered By               Order #        Accession #    Episode #   1  Benjaman Lobe            [id number]      [id number]  [id number]   [place]            [id number]      [id number]     [id number]   [place]            [id number]      [id number]     [id number]   4  Benjaman Lobe            [id number]      [id number]     [id number]  ---------------------------------------------------------------------- Indications   [redacted] weeks gestation of pregnancy                Z3A.33   Twin pregnancy, mono/di, third trimester       O30.033   Maternal care for known or suspected poor      O36.5932   fetal growth, third trimester, fetus 2   Obesity complicating pregnancy, third          O99.213   trimester   Gestational hypertension without significant   O13.3   proteinuria, third trimester   Gestational diabetes in pregnancy, diet        O24.410   controlled  ---------------------------------------------------------------------- OB History  Blood Type:            Height:  5'3"   Weight (lb):  198      BMI:   35.07  Gravidity:    8         Term:   4        Prem:   0        SAB:   0  TOP:          3       Ectopic:  0        Living: 4  ---------------------------------------------------------------------- Fetal Evaluation (Fetus A)  Num Of Fetuses:     2  Fetal Heart         138  Rate(bpm):  Cardiac Activity:   Observed  Presentation:       Cephalic  Placenta:           Anterior, above cervical os  Amniotic Fluid  AFI FV:      Subjectively within normal limits                              Largest Pocket(cm)                              2.6 ---------------------------------------------------------------------- Biophysical Evaluation (Fetus A)  Amniotic F.V:   Pocket => 2 cm two         F. Tone:        Observed                  planes  F. Movement:    Observed                   Score:          8/8  F. Breathing:   Observed ---------------------------------------------------------------------- Gestational Age (Fetus A)  LMP:           35w 5d       Date:   01/14/16                 EDD:   10/20/16  Best:          Courtney Heys 3d  Det. By:   Loman Chroman         EDD:   11/05/16                                      (04/03/16) ---------------------------------------------------------------------- Anatomy (Fetus A)  Stomach:               Appears normal, left   Bladder:                Appears normal                         sided ---------------------------------------------------------------------- Doppler - Fetal Vessels (Fetus A)  Umbilical Artery   S/D     %tile     RI              PI              PSV    ADFV    RDFV                                                   (cm/s)  2.44       42   0.59             0.89             31.76      No      No ---------------------------------------------------------------------- Fetal Evaluation (Fetus B)  Num Of Fetuses:     2  Fetal Heart         138  Rate(bpm):  Cardiac Activity:   Observed  Fetal Lie:          Maternal left side  Presentation:       Cephalic  Placenta:           Anterior, above cervical os  Amniotic Fluid  AFI FV:      Subjectively within normal limits                              Largest Pocket(cm)                               3.11 ---------------------------------------------------------------------- Biophysical Evaluation (Fetus B)  Amniotic F.V:   Pocket => 2 cm two         F. Tone:        Observed                  planes  F. Movement:    Observed                   Score:          8/8  F. Breathing:   Observed ---------------------------------------------------------------------- Gestational Age (Fetus B)  LMP:           35w 5d       Date:   01/14/16                 EDD:   10/20/16  Best:          33w 3d    Det. By:   Loman Chroman  EDD:   11/05/16                                      (04/03/16) ---------------------------------------------------------------------- Anatomy (Fetus B)  Stomach:               Appears normal, left   Bladder:                Appears normal                         sided ---------------------------------------------------------------------- Doppler - Fetal Vessels (Fetus B)  Umbilical Artery   S/D     %tile     RI              PI              PSV    ADFV    RDFV                                                   (cm/s)  4.18    > 97.5  0.76             1.24             44.68     Yes      No ---------------------------------------------------------------------- Impression   Indication: 31 yr old [id number] at 59w3dwith  monochorionic/diamniotic twin gestation, lagging fetal growth  in twin B with abnormal Doppler studies, gestational diabetes,  and gestational hypertension for BPP and Doppler studies.  Findings:  1. Monochorionic/diamniotic twin gestation; the dividing  membrane is seen.  2. Anterior placenta without evidence of previa.  3. Normal amniotic fluid volume for both fetuses.  4. Normal stomach and bladder seen in each fetus.  5. Normal biophysical profile of 8/8 for both fetuses.  6. Normal umbilical artery Doppler studies in twin A; elevated  systolic/diastolic ration in twin B umbilical artery Doppler  studies- no absent or reversal of flow is seen.  6. Both  fetuses are in cephalic presentation. ---------------------------------------------------------------------- Recommendations  1. Monochorionic/diamniotic twin gestation:  - previously counseled  - continue antenatal testing  - given complicated with lagging fetal growth and abnormal  Doppler studies in twin B recommend delivery at 34 weeks or  sooner if clinically indicated  - s/p betamethasone  - no evidence of twin twin transfusion syndrome on today's  ultrasound  2. Gestational diabetes:  - diet controlled  - fetal surveillance as above  3. Gestational hypertension:  - had mild range blood pressures today (140-151/95-105);  also reporting headache and vision changes  - sent to MAU for evaluation  - management per primary OB; call with questions  - if has abnormal labs and/or persistent symptoms  recommend delivery  - recommend a period of surveillance to evaluate for severe  preeclampsia/gestational hypertension- if meets criteria  would recommend inpatient management until delivery at 34  weeks or sooner if clinically indicated  Discussed with Dr. NErnestina Patcheswho is in agreement with the  above plan ----------------------------------------------------------------------                KWende Mott MD Electronically Signed Final Report   09/20/2016 12:45 pm ----------------------------------------------------------------------   Assessment and Plan: Patient Active Problem  List   Diagnosis Date Noted  . Supervision of other high risk pregnancy, antepartum 09/20/2016  . Pregnancy affected by fetal growth restriction 09/20/2016  . GDM (gestational diabetes mellitus) 09/11/2016  . Gestational hypertension without significant proteinuria 08/29/2016  . Unwanted fertility 08/14/2016  . History of postpartum hemorrhage, currently pregnant 05/25/2016  . Drug use affecting pregnancy, antepartum 05/25/2016  . Bipolar 1 disorder ([place]) 05/25/2016  . Insomnia 05/25/2016  . Monochorionic diamniotic twin pregnancy,  antepartum 04/25/2016   Admit to Antenatal  #GHTN with concern for development of Prex - Admit to antenatal - Monitor BO closely -labetalol protocol, required 1 IV dose in MAU if additional dose need patient will meet criteria for Prex with severe features by blood pressure and will need IOL prior to 34 weeks. Discussed with patient.  -Plan for IOL at 34 weeks due to Twin B dopplers, will request from LD today  #A1GDM - BS AC/HA and 2 hr pp - monitor for elevated values - card modified diet  #History of PPH  - discussed with patient. Type and screen obtained - Reviewed treatment with cytotec empirically at delivery due to grand multiparity/multiple gestation  #Fetal Well Being- mo-di twins - s/p BMZ on 5/1 and 5/2 - Will give rescue series BMZ  - Continuous monitoring - Vtx/Vtx  Caren Macadam, MD, MPH, ABFM Attending [place] for [facility]

## 2016-09-20 NOTE — Progress Notes (Signed)
Patient GAD-7 was high, and she declined to see jamie. Tdap given today

## 2016-09-20 NOTE — ED Notes (Signed)
Report called to [place], Therapist, sports.  Pt to MAU for evaluation of elevated BP.

## 2016-09-20 NOTE — MAU Note (Signed)
Reports elevated BP and headache, sent from MFM.

## 2016-09-20 NOTE — Progress Notes (Signed)
   PRENATAL VISIT NOTE  Subjective:  Robin Horn is a 31 y.o. [id number] at [redacted]w[redacted]d being seen today for ongoing prenatal care.  She is currently monitored for the following issues for this high-risk pregnancy and has Monochorionic diamniotic twin pregnancy, antepartum; History of postpartum hemorrhage, currently pregnant; Drug use affecting pregnancy, antepartum; Bipolar 1 disorder ([place]); Insomnia; Unwanted fertility; Gestational hypertension without significant proteinuria; GDM (gestational diabetes mellitus); Supervision of other high risk pregnancy, antepartum; and Pregnancy affected by fetal growth restriction on her problem list.  Patient reports occasional contractions.  Denies HA, vision changes or epigastric pain. Contractions: Irregular. Vag. Bleeding: None.  Movement: Present. Denies leaking of fluid.   The following portions of the patient's history were reviewed and updated as appropriate: allergies, current medications, past family history, past medical history, past social history, past surgical history and problem list. Problem list updated.  Objective:   Vitals:   09/20/16 0946 09/20/16 1006  BP: (!) 140/95 (!) 147/100  Pulse: 94 97  Weight: 198 lb 14.4 oz (90.2 kg)     Fetal Status: Fetal Heart Rate (bpm): 145 Fundal Height: 39 cm Movement: Present  Presentation: Vertex  General:  Alert, oriented and cooperative. Patient is in no acute distress.  Skin: Skin is warm and dry. No rash noted.   Cardiovascular: Normal heart rate noted  Respiratory: Normal respiratory effort, no problems with respiration noted  Abdomen: Soft, gravid, appropriate for gestational age. Pain/Pressure: Present     Pelvic:  Cervical exam performed Dilation: 1.5 Effacement (%): 0 Station: Ballotable  Extremities: Normal range of motion.  Edema: Trace  Mental Status: Normal mood and affect. Normal behavior. Normal judgment and thought content.   Assessment and Plan:  Pregnancy: [id number] at  [redacted]w[redacted]d  1. History of postpartum hemorrhage, currently pregnant  - Tdap vaccine greater than or equal to 7yo IM  2. Supervision of other high risk pregnancy, antepartum  - Tdap vaccine greater than or equal to 7yo IM  3. Monochorionic diamniotic twin pregnancy, antepartum - Dopplers and AFI today and weekly. Growth [place] scheduled  - Tdap vaccine greater than or equal to 7yo IM  4. Gestational diabetes mellitus (GDM) in third trimester, gestational diabetes method of control unspecified - Just got meter, testing supplies yesterday.  - Tdap vaccine greater than or equal to 7yo IM  5. Pregnancy affected by fetal growth restriction  - Tdap vaccine greater than or equal to 7yo IM  6. Gest HTN - Pre-E precautions.   Preterm labor symptoms and general obstetric precautions including but not limited to vaginal bleeding, contractions, leaking of fluid and fetal movement were reviewed in detail with the patient. Please refer to After Visit Summary for other counseling recommendations.  Return in about 1 weeks (around 10/04/2016) for [place]. Bring CBG log. BTL consent signed   Manya Silvas, CNM

## 2016-09-20 NOTE — MAU Provider Note (Signed)
History     CSN: [id number]  Arrival date and time: 09/20/16 1244  First Provider Initiated Contact with Patient 09/20/16 1331      Chief Complaint  Patient presents with  . Hypertension   HPI Robin Horn is a 31 y.o. [id number] at 9w3dwith mono/di twins who presents for BP evaluation & headache. Patient in MFM today for ultrasound & was sent here for further evaluation. Gestational hypertension in current pregnancy. Currently no meds for htn.  Reports headache since last night. Constant generalized headache. Rates pain 8/10. Has not treated. Photophobia present. Also reports some right epigastric pain since last night. Denies visual disturbance, n/v, chest pain, SOB, or abdominal pain. Positive fetal movement.   OB History    Gravida Para Term Preterm AB Living   8 4 4  0 3 4   SAB TAB Ectopic Multiple Live Births   0 3 0 0 4      Past Medical History:  Diagnosis Date  . Abnormal Pap smear of cervix   . Anxiety   . Chlamydia   . Depression   . Gestational diabetes 2018  . Gonorrhea   . Herpes genitalia   . Migraines   . Ovarian cyst   . Preeclampsia   . Pregnancy induced hypertension     Past Surgical History:  Procedure Laterality Date  . COLPOSCOPY    . DILATION AND CURETTAGE OF UTERUS    . TONSILLECTOMY      Family History  Problem Relation Age of Onset  . Kidney disease Mother   . Cancer Paternal Grandmother   . Other Neg Hx     Social History  Substance Use Topics  . Smoking status: Light Tobacco Smoker    Packs/day: 0.25    Types: Cigarettes  . Smokeless tobacco: Never Used  . Alcohol use 0.6 oz/week    1 Glasses of wine per week     Comment: Once every 2 months. Nothing since finding out about pregnancy.     Allergies:  Allergies  Allergen Reactions  . Lactose Intolerance (Gi) Diarrhea    Prescriptions Prior to Admission  Medication Sig Dispense Refill Last Dose  . ACCU-CHEK FASTCLIX LANCETS MISC 1 Units by Percutaneous route 4 (four)  times daily. 100 each 12 Taking  . acetaminophen (TYLENOL) 500 MG tablet Take 1,000 mg by mouth every 6 (six) hours as needed for moderate pain.   Taking  . Blood Glucose Monitoring Suppl (ACCU-CHEK NANO SMARTVIEW) w/Device KIT 1 kit by Subdermal route as directed. Check blood sugars for fasting, and two hours after breakfast, lunch and dinner (4 checks daily) 1 kit 0 Taking  . butalbital-acetaminophen-caffeine (FIORICET, ESGIC) 50-325-40 MG tablet Take 1-2 tablets by mouth every 6 (six) hours as needed for headache. 20 tablet 0 Taking  . cyclobenzaprine (FLEXERIL) 10 MG tablet take 1 tablet by mouth three times a day if needed for muscle spasm 20 tablet 0 Taking  . glucose blood (ACCU-CHEK SMARTVIEW) test strip Use as instructed to check blood sugars 100 each 12 Taking  . Prenatal Multivit-Min-Fe-FA (PRE-NATAL FORMULA) TABS Take 1 tablet by mouth daily. 30 each 6 Taking  . ranitidine (ZANTAC) 150 MG tablet Take 1 tablet (150 mg total) by mouth 2 (two) times daily. 60 tablet 6 Taking  . valACYclovir (VALTREX) 1000 MG tablet Take 1 tablet (1,000 mg total) by mouth 2 (two) times daily. Take for ten days. 20 tablet 3 Taking  . zolpidem (AMBIEN) 5 MG tablet take 1 tablet  by mouth at bedtime if needed (Patient not taking: Reported on 09/20/2016) 30 tablet 0 Not Taking    Review of Systems  Constitutional: Negative.   Eyes: Negative for visual disturbance.  Respiratory: Negative.   Cardiovascular: Negative.   Gastrointestinal:       +right epigastric pain  Genitourinary: Negative.   Neurological: Positive for headaches.   Physical Exam   Blood pressure 138/89, pulse 91, temperature 98.5 F (36.9 C), temperature source Oral, resp. rate 18, height 5' 3"  (1.6 m), weight 198 lb (89.8 kg), last menstrual period 01/14/2016.  Temp:  [98.5 F (36.9 C)] 98.5 F (36.9 C) (05/23 1316) Pulse Rate:  [91-114] 91 (05/23 1431) Resp:  [18] 18 (05/23 1316) BP: (137-160)/(89-105) 138/89 (05/23 1431) Weight:   [198 lb (89.8 kg)-198 lb 14.4 oz (90.2 kg)] 198 lb (89.8 kg) (05/23 1316)  Physical Exam  Nursing note and vitals reviewed. Constitutional: She is oriented to person, place, and time. She appears well-developed and well-nourished. No distress.  HENT:  Head: Normocephalic and atraumatic.  Eyes: Conjunctivae are normal. Right eye exhibits no discharge. Left eye exhibits no discharge. No scleral icterus.  Neck: Normal range of motion.  Cardiovascular: Normal rate, regular rhythm and normal heart sounds.   No murmur heard. Respiratory: Effort normal and breath sounds normal. No respiratory distress. She has no wheezes.  GI: Soft. Bowel sounds are normal. There is no tenderness.  Neurological: She is alert and oriented to person, place, and time. She has normal reflexes.  No clonus  Skin: Skin is warm and dry. She is not diaphoretic.  Psychiatric: She has a normal mood and affect. Her behavior is normal. Judgment and thought content normal.   Fetal Tracing: Baby A Baseline: 140 Variability: moderate Accelerations: 15x15 Decelerations: variable  Baby B Baseline: 145 Variability: moderate Accelerations: 15x15 Decelerations: none  Toco: irregular x3 in >1hr,  40-50 sec  MAU Course  Procedures Results for orders placed or performed during the hospital encounter of 09/20/16 (from the past 24 hour(s))  Protein / creatinine ratio, urine     Status: None   Collection Time: 09/20/16  1:00 PM  Result Value Ref Range   Creatinine, Urine 127.00 mg/dL   Total Protein, Urine 17 mg/dL   Protein Creatinine Ratio 0.13 0.00 - 0.15 mg/mg[Cre]  Rapid urine drug screen (hospital performed)     Status: None   Collection Time: 09/20/16  1:00 PM  Result Value Ref Range   Opiates NONE DETECTED NONE DETECTED   Cocaine NONE DETECTED NONE DETECTED   Benzodiazepines NONE DETECTED NONE DETECTED   Amphetamines NONE DETECTED NONE DETECTED   Tetrahydrocannabinol NONE DETECTED NONE DETECTED   Barbiturates  NONE DETECTED NONE DETECTED  Urinalysis, Routine w reflex microscopic     Status: Abnormal   Collection Time: 09/20/16  1:00 PM  Result Value Ref Range   Color, Urine YELLOW YELLOW   APPearance HAZY (A) CLEAR   Specific Gravity, Urine 1.015 1.005 - 1.030   pH 6.0 5.0 - 8.0   Glucose, UA NEGATIVE NEGATIVE mg/dL   Hgb urine dipstick NEGATIVE NEGATIVE   Bilirubin Urine NEGATIVE NEGATIVE   Ketones, ur NEGATIVE NEGATIVE mg/dL   Protein, ur NEGATIVE NEGATIVE mg/dL   Nitrite NEGATIVE NEGATIVE   Leukocytes, UA NEGATIVE NEGATIVE  CBC     Status: Abnormal   Collection Time: 09/20/16  1:12 PM  Result Value Ref Range   WBC 10.7 (H) 4.0 - 10.5 K/uL   RBC 4.19 3.87 - 5.11 MIL/uL  Hemoglobin 12.0 12.0 - 15.0 g/dL   HCT 37.1 36.0 - 46.0 %   MCV 88.5 78.0 - 100.0 fL   MCH 28.6 26.0 - 34.0 pg   MCHC 32.3 30.0 - 36.0 g/dL   RDW 14.9 11.5 - 15.5 %   Platelets 188 150 - 400 K/uL  Comprehensive metabolic panel     Status: Abnormal   Collection Time: 09/20/16  1:12 PM  Result Value Ref Range   Sodium 136 135 - 145 mmol/L   Potassium 3.9 3.5 - 5.1 mmol/L   Chloride 105 101 - 111 mmol/L   CO2 23 22 - 32 mmol/L   Glucose, Bld 101 (H) 65 - 99 mg/dL   BUN 6 6 - 20 mg/dL   Creatinine, Ser 0.65 0.44 - 1.00 mg/dL   Calcium 9.1 8.9 - 10.3 mg/dL   Total Protein 6.6 6.5 - 8.1 g/dL   Albumin 2.8 (L) 3.5 - 5.0 g/dL   AST 20 15 - 41 U/L   ALT 13 (L) 14 - 54 U/L   Alkaline Phosphatase 193 (H) 38 - 126 U/L   Total Bilirubin 0.2 (L) 0.3 - 1.2 mg/dL   GFR calc non Af Amer >60 >60 mL/min   GFR calc Af Amer >60 >60 mL/min   Anion gap 8 5 - 15    MDM BPP today, 8/8 for both, vtx/vtx Severe range BPs, labetalol protocol ordered CBC, CMP, urine PCR pending Hx of + cocaine during this pregnancy; UDS pending Tylenol 1 gm PO -- headache improving, 9>5 S/w Dr. Ernestina Patches. Will admit & monitor BPs. Per MFM, delivery recommended at 34 wks d/t increases dopplers in baby B. Dr. Ernestina Patches at bedside discussing plan of  care with patient.  Assessment and Plan  A:  1. Pregnancy-induced hypertension in third trimester   2. Monochorionic diamniotic twin gestation in third trimester    P: Admit per Dr. Elnoria Howard 09/20/2016, 1:31 PM

## 2016-09-21 ENCOUNTER — Encounter: Payer: Self-pay | Admitting: *Deleted

## 2016-09-21 DIAGNOSIS — O133 Gestational [pregnancy-induced] hypertension without significant proteinuria, third trimester: Secondary | ICD-10-CM | POA: Diagnosis not present

## 2016-09-21 LAB — GLUCOSE, CAPILLARY
GLUCOSE-CAPILLARY: 98 mg/dL (ref 65–99)
Glucose-Capillary: 102 mg/dL — ABNORMAL HIGH (ref 65–99)
Glucose-Capillary: 103 mg/dL — ABNORMAL HIGH (ref 65–99)

## 2016-09-21 MED ORDER — HYDROCODONE-ACETAMINOPHEN 5-325 MG PO TABS
1.0000 | ORAL_TABLET | Freq: Four times a day (QID) | ORAL | Status: DC | PRN
  Administered 2016-09-21 – 2016-09-22 (×4): 1 via ORAL
  Filled 2016-09-21 (×4): qty 1

## 2016-09-21 MED ORDER — ZOLPIDEM TARTRATE 5 MG PO TABS: 5.0000 mg | ORAL_TABLET | ORAL | Status: AC

## 2016-09-21 MED ORDER — CYCLOBENZAPRINE HCL 10 MG PO TABS
10.0000 mg | ORAL_TABLET | Freq: Three times a day (TID) | ORAL | Status: DC | PRN
  Administered 2016-09-21 – 2016-09-23 (×4): 10 mg via ORAL
  Filled 2016-09-21 (×5): qty 1

## 2016-09-21 MED ORDER — SODIUM CHLORIDE 0.9% FLUSH: 3.0000 mL | INTRAVENOUS | Status: DC | PRN

## 2016-09-21 MED ORDER — SODIUM CHLORIDE 0.9% FLUSH
3.0000 mL | Freq: Two times a day (BID) | INTRAVENOUS | Status: DC
  Administered 2016-09-21 – 2016-09-23 (×6): 3 mL via INTRAVENOUS

## 2016-09-21 NOTE — Progress Notes (Signed)
Inpatient Diabetes Program Recommendations  Diabetes Treatment Program Recommendations  ADA Standards of Care 2018 Diabetes in Pregnancy Target Glucose Ranges:  Fasting: 60 - 90 mg/dL Preprandial: 60 - 105 mg/dL 1 hr postprandial: Less than 140mg /dL (from first bite of meal) 2 hr postprandial: Less than 120 mg/dL (from first bite of meal)   Results for TYLA, BURGNER (MRN [id number]) as of 09/21/2016 07:34  Ref. Range 09/20/2016 23:34  Glucose-Capillary Latest Ref Range: 65 - 99 mg/dL 165 (H)   Results for ABRI, VACCA (MRN [id number]) as of 09/21/2016 07:34  Ref. Range 09/20/2016 13:12  Glucose Latest Ref Range: 65 - 99 mg/dL 101 (H)   Review of Glycemic Control  Diabetes history: GDM Outpatient Diabetes medications: None Current orders for Inpatient glycemic control: CBG monitoring  Inpatient Diabetes Program Recommendations: Correction (SSI): Noted patient has a history of GDM but was not taking any DM medications as an outpatient. Patient received Betamethasone on 5/23 and will receive 2nd dose today. While inpatient, please consider using Diabetic Pregnant Patient order set to order CBGs and Novolog correction scale 0-16 units QID (fasting and 2 hour post prandial).  Thanks, Barnie Alderman, RN, MSN, CDE Diabetes Coordinator Inpatient Diabetes Program [phone] (Team Pager from 8am to 5pm)

## 2016-09-21 NOTE — Progress Notes (Signed)
FACULTY PRACTICE ANTEPARTUM(COMPREHENSIVE) NOTE  Robin Horn is a 31 y.o. [phone] at [redacted]w[redacted]d by early ultrasound who is admitted for twin gestation with elevated dopplers >97%ile for baby B, with symmetric growth , and BPP 8/8 x 2 yesterday, but elevated pressures requiring one dose labetalol. Headache no recurrence. She is scheduled for delivery IOL on Sunday  Admit note:Marland Kitchen  Robin Horn is a 31 y.o. [phone] at 104w3d admitted for elevated BP in the setting of high risk pregnancy due to mono-di twins with known gHTN and progressively elevated BP. Patient was seen in MFM [place] and noted to have BP140-150s/90-100s with HA and vision changes. Additionally fetal UA dopplers on twin B showed elevated S/D ratio (4.18) and RI (0.76).  Given increased dopplers MFM recommended delivery at 34 weeks. She was evaluated in MAU and found to have persistently elevated BP and required 1 dose of labetalol for severe range BP. HA resolved with tylenol.  Fetal presentation is cephalic and cephalic. Length of Stay:  1  Days  Subjective:  Pt monitor last night was reactive, though difficult to get both NST's reactive on the same 20 minute strip this am some mild contractions noted but pt sleeping through them. Patient reports the fetal movement as active. Patient reports uterine contraction  activity as irregular, every 4-7 minutes, mild. Patient reports  vaginal bleeding as none. Patient describes fluid per vagina as None.  Vitals:  Blood pressure (!) 148/84, pulse (!) 109, temperature 98.4 F (36.9 C), temperature source Oral, resp. rate 18, height 5\' 3"  (1.6 m), weight 89.8 kg (198 lb), last menstrual period 01/14/2016, SpO2 99 %. Physical Examination:  General appearance - alert, well appearing, and in no distress, oriented to person, place, and time and normal appearing weight Heart - normal rate and regular rhythm Abdomen - soft, nontender, nondistended Fundal Height:  consistent with twins Cervical Exam:  Not evaluated.  Extremities: extremities normal, atraumatic, no cyanosis or edema and Homans sign is negative, no sign of DVT with DTRs 2+ bilaterally Membranes:intact  Fetal Monitoring:  Baseline: 145/140 bpm, Variability: Good {> 6 bpm), Accelerations: Non-reactive but appropriate for gestational age and Decelerations: Absent  Labs:  Results for orders placed or performed during the hospital encounter of 09/20/16 (from the past 24 hour(s))  Protein / creatinine ratio, urine   Collection Time: 09/20/16  1:00 PM  Result Value Ref Range   Creatinine, Urine 127.00 mg/dL   Total Protein, Urine 17 mg/dL   Protein Creatinine Ratio 0.13 0.00 - 0.15 mg/mg[Cre]  Rapid urine drug screen (hospital performed)   Collection Time: 09/20/16  1:00 PM  Result Value Ref Range   Opiates NONE DETECTED NONE DETECTED   Cocaine NONE DETECTED NONE DETECTED   Benzodiazepines NONE DETECTED NONE DETECTED   Amphetamines NONE DETECTED NONE DETECTED   Tetrahydrocannabinol NONE DETECTED NONE DETECTED   Barbiturates NONE DETECTED NONE DETECTED  Urinalysis, Routine w reflex microscopic   Collection Time: 09/20/16  1:00 PM  Result Value Ref Range   Color, Urine YELLOW YELLOW   APPearance HAZY (A) CLEAR   Specific Gravity, Urine 1.015 1.005 - 1.030   pH 6.0 5.0 - 8.0   Glucose, UA NEGATIVE NEGATIVE mg/dL   Hgb urine dipstick NEGATIVE NEGATIVE   Bilirubin Urine NEGATIVE NEGATIVE   Ketones, ur NEGATIVE NEGATIVE mg/dL   Protein, ur NEGATIVE NEGATIVE mg/dL   Nitrite NEGATIVE NEGATIVE   Leukocytes, UA NEGATIVE NEGATIVE  CBC   Collection Time: 09/20/16  1:12 PM  Result Value  Ref Range   WBC 10.7 (H) 4.0 - 10.5 K/uL   RBC 4.19 3.87 - 5.11 MIL/uL   Hemoglobin 12.0 12.0 - 15.0 g/dL   HCT 37.1 36.0 - 46.0 %   MCV 88.5 78.0 - 100.0 fL   MCH 28.6 26.0 - 34.0 pg   MCHC 32.3 30.0 - 36.0 g/dL   RDW 14.9 11.5 - 15.5 %   Platelets 188 150 - 400 K/uL  Comprehensive metabolic panel   Collection Time: 09/20/16  1:12 PM   Result Value Ref Range   Sodium 136 135 - 145 mmol/L   Potassium 3.9 3.5 - 5.1 mmol/L   Chloride 105 101 - 111 mmol/L   CO2 23 22 - 32 mmol/L   Glucose, Bld 101 (H) 65 - 99 mg/dL   BUN 6 6 - 20 mg/dL   Creatinine, Ser 0.65 0.44 - 1.00 mg/dL   Calcium 9.1 8.9 - 10.3 mg/dL   Total Protein 6.6 6.5 - 8.1 g/dL   Albumin 2.8 (L) 3.5 - 5.0 g/dL   AST 20 15 - 41 U/L   ALT 13 (L) 14 - 54 U/L   Alkaline Phosphatase 193 (H) 38 - 126 U/L   Total Bilirubin 0.2 (L) 0.3 - 1.2 mg/dL   GFR calc non Af Amer >60 >60 mL/min   GFR calc Af Amer >60 >60 mL/min   Anion gap 8 5 - 15  Type and screen [facility]   Collection Time: 09/20/16  1:45 PM  Result Value Ref Range   ABO/RH(D) AB POS    Antibody Screen NEG    Sample Expiration 09/23/2016   Glucose, capillary   Collection Time: 09/20/16 11:34 PM  Result Value Ref Range   Glucose-Capillary 165 (H) 65 - 99 mg/dL  Glucose, capillary   Collection Time: 09/21/16  7:59 AM  Result Value Ref Range   Glucose-Capillary 103 (H) 65 - 99 mg/dL   Comment 1 Notify RN    Comment 2 Document in Chart   Results for orders placed or performed in visit on 09/20/16 (from the past 24 hour(s))  POCT urinalysis dip (device)   Collection Time: 09/20/16 10:56 AM  Result Value Ref Range   Glucose, UA NEGATIVE NEGATIVE mg/dL   Bilirubin Urine NEGATIVE NEGATIVE   Ketones, ur NEGATIVE NEGATIVE mg/dL   Specific Gravity, Urine 1.010 1.005 - 1.030   Hgb urine dipstick NEGATIVE NEGATIVE   pH 6.0 5.0 - 8.0   Protein, ur NEGATIVE NEGATIVE mg/dL   Urobilinogen, UA 0.2 0.0 - 1.0 mg/dL   Nitrite NEGATIVE NEGATIVE   Leukocytes, UA SMALL (A) NEGATIVE    Imaging Studies:     Currently EPIC will not allow sonographic studies to automatically populate into notes.  In the meantime, copy and paste results into note or free text.  Medications:  Scheduled . betamethasone acetate-betamethasone sodium phosphate  12 mg Intramuscular Q24 Hr x 2  . docusate sodium   100 mg Oral Daily  . nicotine  21 mg Transdermal Daily  . prenatal multivitamin  1 tablet Oral Q1200   I have reviewed the patient's current medications.  ASSESSMENT: Patient Active Problem List   Diagnosis Date Noted  . Supervision of other high risk pregnancy, antepartum 09/20/2016  . Pregnancy affected by fetal growth restriction 09/20/2016  . Gestational HTN 09/20/2016  . GDM (gestational diabetes mellitus) 09/11/2016  . Gestational hypertension without significant proteinuria 08/29/2016  . Unwanted fertility 08/14/2016  . History of postpartum hemorrhage, currently pregnant 05/25/2016  .  Drug use affecting pregnancy, antepartum 05/25/2016  . Bipolar 1 disorder ([place]) 05/25/2016  . Insomnia 05/25/2016  . Monochorionic diamniotic twin pregnancy, antepartum 04/25/2016    PLAN: Repeat labs in am IOL scheduled for Sunday midnight.  Jonnie Kind 09/21/2016,8:42 AM    Patient ID: Blaine Hamper, female   DOB: 04-15-1986, 31 y.o.   MRN: [id number]

## 2016-09-22 DIAGNOSIS — O133 Gestational [pregnancy-induced] hypertension without significant proteinuria, third trimester: Secondary | ICD-10-CM

## 2016-09-22 LAB — GLUCOSE, CAPILLARY
Glucose-Capillary: 111 mg/dL — ABNORMAL HIGH (ref 65–99)
Glucose-Capillary: 143 mg/dL — ABNORMAL HIGH (ref 65–99)
Glucose-Capillary: 101 mg/dL — ABNORMAL HIGH (ref 65–99)
Glucose-Capillary: 124 mg/dL — ABNORMAL HIGH (ref 65–99)

## 2016-09-22 NOTE — Progress Notes (Signed)
Initial Nutrition Assessment  DOCUMENTATION CODES:  Not applicable  INTERVENTION:  Carbohydrate modified gestational diabetic diet  NUTRITION DIAGNOSIS:  Increased nutrient needs related to  (twin pregnancy and fetal growth requirments) as evidenced by  (33 5/7 weeks IUP).  GOAL:  Patient will meet greater than or equal to 90% of their needs  MONITOR: weight trends  REASON FOR ASSESSMENT:   Antenatal   ASSESSMENT:   33 5/7 weeks twins, HTN, GDM pre-preg weight 171 lbs, BMI 30.4. 27 lb weight gain  IOL 5/27  Diet Order:  Diet gestational carb mod Room service appropriate? Yes; Fluid consistency: Thin  Height:   Ht Readings from Last 1 Encounters:  09/20/16 5\' 3"  (1.6 m)   Weight:   Wt Readings from Last 1 Encounters:  09/20/16 198 lb (89.8 kg)    Ideal Body Weight:     BMI:  Body mass index is 35.07 kg/m.  Estimated Nutritional Needs:   Kcal:  2100-2300  Protein:  95-105 g  Fluid:  2.4 L  EDUCATION NEEDS:   No education needs identified at this time Outpt educ 09/18/16  Cathlean Sauer.Fredderick Severance LDN Neonatal Nutrition Support Specialist/RD III Pager [phone]      Phone [phone]

## 2016-09-22 NOTE — Progress Notes (Signed)
FACULTY PRACTICE ANTEPARTUM(COMPREHENSIVE) NOTE  Robin Horn is a 31 y.o. [phone] at 56w5dby early ultrasound who is admitted for twin gestation with elevated dopplers >97%ile for baby B, with symmetric growth , and BPP 8/8 x 2 yesterday, but elevated pressures requiring one dose labetalol. Headache no recurrence.     Robin YOTTis a 31y.o. [phone] 329w5ddmitted for elevated BP in the setting of high risk pregnancy due to mono-di twins with known gHTN and progressively elevated BP. Patient [place] noted to have BP140-150s/90-100s with HA and vision changes. Additionally fetal UA dopplers on twin B showed elevated S/D ratio (4.18)/>97% and RI (0.76).  Given increased dopplers MFM recommended delivery at 34 weeks. She was evaluated in MAU and found to have persistently elevated BP and required 1 dose of labetalol for severe range BP. HA resolved with tylenol. She is scheduled for delivery IOL on Sunday Fetal presentation is cephalic and cephalic on 09/20/16 scan. Length of Stay:  2  Days  Subjective:  Reports bad back pain; has chronic back pain which is treated with Vicodin and Flexeril. Patient reports the fetal movement as active. Patient reports uterine contraction  activity as rare Patient reports  vaginal bleeding as none. Patient describes fluid per vagina as None.  Vitals:  Blood pressure 133/85, pulse (!) 104, temperature 97.5 F (36.4 C), temperature source Axillary, resp. rate 18, height 5' 3" (1.6 m), weight 198 lb (89.8 kg), last menstrual period 01/14/2016, SpO2 98 %. Physical Examination:  General appearance - alert, well appearing, and in no distress, oriented to person, place, and time and normal appearing weight Heart - normal rate and regular rhythm Abdomen - soft, nontender, nondistended Fundal Height:  consistent with twins Cervical Exam: Not evaluated.  Extremities: extremities normal, atraumatic, no cyanosis or edema and Homans sign is  negative, no sign of DVT with DTRs 2+ bilaterally Membranes:intact  Fetal Monitoring:  Baseline: 145/140 bpm, Variability: Good {> 6 bpm), Accelerations: Non-reactive but appropriate for gestational age and Decelerations: Absent  Labs:  Results for orders placed or performed during the hospital encounter of 09/20/16 (from the past 72 hour(s))  Protein / creatinine ratio, urine     Status: None   Collection Time: 09/20/16  1:00 PM  Result Value Ref Range   Creatinine, Urine 127.00 mg/dL   Total Protein, Urine 17 mg/dL    Comment: NO NORMAL RANGE ESTABLISHED FOR THIS TEST   Protein Creatinine Ratio 0.13 0.00 - 0.15 mg/mg[Cre]  Rapid urine drug screen (hospital performed)     Status: None   Collection Time: 09/20/16  1:00 PM  Result Value Ref Range   Opiates NONE DETECTED NONE DETECTED   Cocaine NONE DETECTED NONE DETECTED   Benzodiazepines NONE DETECTED NONE DETECTED   Amphetamines NONE DETECTED NONE DETECTED   Tetrahydrocannabinol NONE DETECTED NONE DETECTED   Barbiturates NONE DETECTED NONE DETECTED    Comment:        DRUG SCREEN FOR MEDICAL PURPOSES ONLY.  IF CONFIRMATION IS NEEDED FOR ANY PURPOSE, NOTIFY LAB WITHIN 5 DAYS.        LOWEST DETECTABLE LIMITS FOR URINE DRUG SCREEN Drug Class       Cutoff (ng/mL) Amphetamine      1000 Barbiturate      200 Benzodiazepine   200 Tricyclics       300 Opiates          30 0 Cocaine  300 THC              50   Urinalysis, Routine w reflex microscopic     Status: Abnormal   Collection Time: 09/20/16  1:00 PM  Result Value Ref Range   Color, Urine YELLOW YELLOW   APPearance HAZY (A) CLEAR   Specific Gravity, Urine 1.015 1.005 - 1.030   pH 6.0 5.0 - 8.0   Glucose, UA NEGATIVE NEGATIVE mg/dL   Hgb urine dipstick NEGATIVE NEGATIVE   Bilirubin Urine NEGATIVE NEGATIVE   Ketones, ur NEGATIVE NEGATIVE mg/dL   Protein, ur NEGATIVE NEGATIVE mg/dL   Nitrite NEGATIVE NEGATIVE   Leukocytes, UA NEGATIVE NEGATIVE  CBC     Status:  Abnormal   Collection Time: 09/20/16  1:12 PM  Result Value Ref Range   WBC 10.7 (H) 4.0 - 10.5 K/uL   RBC 4.19 3.87 - 5.11 MIL/uL   Hemoglobin 12.0 12.0 - 15.0 g/dL   HCT 37.1 36.0 - 46.0 %   MCV 88.5 78.0 - 100.0 fL   MCH 28.6 26.0 - 34.0 pg   MCHC 32.3 30.0 - 36.0 g/dL   RDW 14.9 11.5 - 15.5 %   Platelets 188 150 - 400 K/uL  Comprehensive metabolic panel     Status: Abnormal   Collection Time: 09/20/16  1:12 PM  Result Value Ref Range   Sodium 136 135 - 145 mmol/L   Potassium 3.9 3.5 - 5.1 mmol/L   Chloride 105 101 - 111 mmol/L   CO2 23 22 - 32 mmol/L   Glucose, Bld 101 (H) 65 - 99 mg/dL   BUN 6 6 - 20 mg/dL   Creatinine, Ser 0.65 0.44 - 1.00 mg/dL   Calcium 9.1 8.9 - 10.3 mg/dL   Total Protein 6.6 6.5 - 8.1 g/dL   Albumin 2.8 (L) 3.5 - 5.0 g/dL   AST 20 15 - 41 U/L   ALT 13 (L) 14 - 54 U/L   Alkaline Phosphatase 193 (H) 38 - 126 U/L   Total Bilirubin 0.2 (L) 0.3 - 1.2 mg/dL   GFR calc non Af Amer >60 >60 mL/min   GFR calc Af Amer >60 >60 mL/min    Comment: (NOTE) The eGFR has been calculated using the CKD EPI equation. This calculation has not been validated in all clinical situations. eGFR's persistently <60 mL/min signify possible Chronic Kidney Disease.    Anion gap 8 5 - 15  Type and screen [place]     Status: None   Collection Time: 09/20/16  1:45 PM  Result Value Ref Range   ABO/RH(D) AB POS    Antibody Screen NEG    Sample Expiration 09/23/2016   Glucose, capillary     Status: Abnormal   Collection Time: 09/20/16  8:13 PM  Result Value Ref Range   Glucose-Capillary 102 (H) 65 - 99 mg/dL   Comment 1 Notify RN   Glucose, capillary     Status: Abnormal   Collection Time: 09/20/16 11:34 PM  Result Value Ref Range   Glucose-Capillary 165 (H) 65 - 99 mg/dL  Glucose, capillary     Status: Abnormal   Collection Time: 09/21/16  7:59 AM  Result Value Ref Range   Glucose-Capillary 103 (H) 65 - 99 mg/dL   Comment 1 Notify RN    Comment 2  Document in Chart   Glucose, capillary     Status: None   Collection Time: 09/21/16  1:20 PM  Result Value Ref Range  Glucose-Capillary 98 65 - 99 mg/dL   Comment 1 Notify RN    Comment 2 Document in Chart      Imaging Studies:     Currently EPIC will not allow sonographic studies to automatically populate into notes.  In the meantime, copy and paste results into note or free text.  Medications:  Scheduled . docusate sodium  100 mg Oral Daily  . nicotine  21 mg Transdermal Daily  . prenatal multivitamin  1 tablet Oral Q1200  . sodium chloride flush  3 mL Intravenous Q12H   I have reviewed the patient's current medications.  ASSESSMENT: Patient Active Problem List   Diagnosis Date Noted  . Supervision of other high risk pregnancy, antepartum 09/20/2016  . Pregnancy affected by fetal growth restriction 09/20/2016  . Gestational HTN 09/20/2016  . GDM (gestational diabetes mellitus) 09/11/2016  . Gestational hypertension without significant proteinuria 08/29/2016  . Unwanted fertility 08/14/2016  . History of postpartum hemorrhage, currently pregnant 05/25/2016  . Drug use affecting pregnancy, antepartum 05/25/2016  . Bipolar 1 disorder ([place]) 05/25/2016  . Insomnia 05/25/2016  . Monochorionic diamniotic twin pregnancy, antepartum 04/25/2016    PLAN: Stable BP Continue pain medications for back pain, no signs of PTL Reactive NST for both  IOL scheduled for Sunday midnight.  Verita Schneiders, MD 09/22/2016,5:59 AM

## 2016-09-23 ENCOUNTER — Inpatient Hospital Stay (HOSPITAL_COMMUNITY): Payer: Medicaid Other

## 2016-09-23 DIAGNOSIS — B951 Streptococcus, group B, as the cause of diseases classified elsewhere: Secondary | ICD-10-CM

## 2016-09-23 HISTORY — DX: Streptococcus, group b, as the cause of diseases classified elsewhere: B95.1

## 2016-09-23 LAB — CBC
HCT: 34.9 % — ABNORMAL LOW (ref 36.0–46.0)
Hemoglobin: 11.2 g/dL — ABNORMAL LOW (ref 12.0–15.0)
MCH: 28.6 pg (ref 26.0–34.0)
MCHC: 32.1 g/dL (ref 30.0–36.0)
MCV: 89.3 fL (ref 78.0–100.0)
PLATELETS: 196 10*3/uL (ref 150–400)
RBC: 3.91 MIL/uL (ref 3.87–5.11)
RDW: 15 % (ref 11.5–15.5)
WBC: 13.5 10*3/uL — AB (ref 4.0–10.5)

## 2016-09-23 LAB — COMPREHENSIVE METABOLIC PANEL
ALT: 13 U/L — AB (ref 14–54)
AST: 20 U/L (ref 15–41)
Albumin: 2.5 g/dL — ABNORMAL LOW (ref 3.5–5.0)
Alkaline Phosphatase: 177 U/L — ABNORMAL HIGH (ref 38–126)
Anion gap: 8 (ref 5–15)
BILIRUBIN TOTAL: 0.3 mg/dL (ref 0.3–1.2)
BUN: 8 mg/dL (ref 6–20)
CHLORIDE: 106 mmol/L (ref 101–111)
CO2: 22 mmol/L (ref 22–32)
CREATININE: 0.58 mg/dL (ref 0.44–1.00)
Calcium: 8.5 mg/dL — ABNORMAL LOW (ref 8.9–10.3)
GFR calc Af Amer: >60 mL/min (ref >60)
Glucose, Bld: 81 mg/dL (ref 65–99)
Potassium: 3.8 mmol/L (ref 3.5–5.1)
Sodium: 136 mmol/L (ref 135–145)
Total Protein: 5.5 g/dL — ABNORMAL LOW (ref 6.5–8.1)

## 2016-09-23 LAB — TYPE AND SCREEN
ABO/RH(D): AB POS
Antibody Screen: NEGATIVE

## 2016-09-23 MED ORDER — HYDROCODONE-ACETAMINOPHEN 5-325 MG PO TABS
1.0000 | ORAL_TABLET | Freq: Three times a day (TID) | ORAL | Status: DC | PRN
  Administered 2016-09-23: 1 via ORAL
  Filled 2016-09-23: qty 1

## 2016-09-23 NOTE — Progress Notes (Signed)
Patient ID: Robin Horn, female   DOB: [birth year], 31 y.o.   MRN: [id number] [place] COMPREHENSIVE PROGRESS NOTE  Robin Horn is a 31 y.o. [id number] at [redacted]w[redacted]d  who is admitted for elevated BP and elevated dopplers n Twin B of Mono/di twin IUP Fetal presentation is vertex/vertex. Length of Stay:  3  Days  Subjective: Pt denies problems currently.    Patient reports good fetal movement.  She reports occ uterine contractions, no bleeding and no loss of fluid per vagina.  Vitals:  Blood pressure 129/83, pulse 80, temperature 97.9 F (36.6 C), temperature source Oral, resp. rate 16, height 5\' 3"  (1.6 m), weight 198 lb (89.8 kg), last menstrual period 01/14/2016, SpO2 100 %. Physical Examination: General appearance - alert, well appearing, and in no distress Abdomen - soft, nontender, nondistended, no masses or organomegaly gravid Cervical Exam: Not evaluated. Extremities: extremities normal, atraumatic, no cyanosis or edema  Membranes:intact  Fetal Monitoring:  Baseline: 130's/130's bpm, Variability: Good {> 6 bpm), Accelerations: Reactive and Decelerations: Absent  Labs:  Results for orders placed or performed during the hospital encounter of 09/20/16 (from the past 24 hour(s))  Glucose, capillary   Collection Time: 09/22/16  8:13 AM  Result Value Ref Range   Glucose-Capillary 101 (H) 65 - 99 mg/dL  Glucose, capillary   Collection Time: 09/22/16 10:43 AM  Result Value Ref Range   Glucose-Capillary 124 (H) 65 - 99 mg/dL   Comment 1 Notify RN    Comment 2 Document in Chart     Imaging Studies:    None pending   Medications:  Scheduled . docusate sodium  100 mg Oral Daily  . nicotine  21 mg Transdermal Daily  . prenatal multivitamin  1 tablet Oral Q1200  . sodium chloride flush  3 mL Intravenous Q12H   I have reviewed the patient's current medications.  ASSESSMENT: Patient Active Problem List   Diagnosis Date Noted  . Supervision of other high risk  pregnancy, antepartum 09/20/2016  . Pregnancy affected by fetal growth restriction 09/20/2016  . Gestational hypertension, third trimester 09/20/2016  . GDM (gestational diabetes mellitus) 09/11/2016  . Gestational hypertension without significant proteinuria 08/29/2016  . Unwanted fertility 08/14/2016  . History of postpartum hemorrhage, currently pregnant 05/25/2016  . Drug use affecting pregnancy, antepartum 05/25/2016  . Bipolar 1 disorder ([place]) 05/25/2016  . Insomnia 05/25/2016  . Monochorionic diamniotic twin pregnancy, antepartum 04/25/2016    PLAN: For IOL 5/27 at [place] Continue routine antenatal care.   Terran Hollenkamp Harraway-Smith 09/23/2016,7:45 AM

## 2016-09-24 ENCOUNTER — Inpatient Hospital Stay (HOSPITAL_COMMUNITY): Payer: Medicaid Other | Admitting: Certified Registered Nurse Anesthetist

## 2016-09-24 ENCOUNTER — Inpatient Hospital Stay (HOSPITAL_COMMUNITY)
Admit: 2016-09-24 | Discharge: 2016-09-24 | Disposition: A | Discharge status: Home / Self Care | Payer: Medicaid Other | Attending: Family Medicine | Admitting: Family Medicine

## 2016-09-24 ENCOUNTER — Inpatient Hospital Stay (HOSPITAL_COMMUNITY): Payer: Medicaid Other | Admitting: Anesthesiology

## 2016-09-24 ENCOUNTER — Encounter (HOSPITAL_COMMUNITY): Payer: Self-pay

## 2016-09-24 ENCOUNTER — Encounter (HOSPITAL_COMMUNITY)
Admission: AD | Disposition: A | Discharge status: Home / Self Care | Payer: Self-pay | Source: Ambulatory Visit | Attending: Family Medicine

## 2016-09-24 DIAGNOSIS — Z302 Encounter for sterilization: Secondary | ICD-10-CM

## 2016-09-24 DIAGNOSIS — O24429 Gestational diabetes mellitus in childbirth, unspecified control: Secondary | ICD-10-CM

## 2016-09-24 DIAGNOSIS — O134 Gestational [pregnancy-induced] hypertension without significant proteinuria, complicating childbirth: Secondary | ICD-10-CM | POA: Diagnosis not present

## 2016-09-24 DIAGNOSIS — O30033 Twin pregnancy, monochorionic/diamniotic, third trimester: Secondary | ICD-10-CM

## 2016-09-24 DIAGNOSIS — Z3A34 34 weeks gestation of pregnancy: Secondary | ICD-10-CM

## 2016-09-24 HISTORY — PX: TUBAL LIGATION: SHX77

## 2016-09-24 LAB — CBC
HEMATOCRIT: 35.1 % — AB (ref 36.0–46.0)
HEMATOCRIT: 37.2 % (ref 36.0–46.0)
HEMOGLOBIN: 11.4 g/dL — AB (ref 12.0–15.0)
HEMOGLOBIN: 11.9 g/dL — AB (ref 12.0–15.0)
MCH: 28.9 pg (ref 26.0–34.0)
MCH: 28.5 pg (ref 26.0–34.0)
MCHC: 32.5 g/dL (ref 30.0–36.0)
MCHC: 32 g/dL (ref 30.0–36.0)
MCV: 89.1 fL (ref 78.0–100.0)
MCV: 89.2 fL (ref 78.0–100.0)
Platelets: 179 10*3/uL (ref 150–400)
Platelets: 179 10*3/uL (ref 150–400)
RBC: 3.94 MIL/uL (ref 3.87–5.11)
RBC: 4.17 MIL/uL (ref 3.87–5.11)
RDW: 14.8 % (ref 11.5–15.5)
RDW: 14.9 % (ref 11.5–15.5)
WBC: 10.4 10*3/uL (ref 4.0–10.5)
WBC: 10 10*3/uL (ref 4.0–10.5)

## 2016-09-24 LAB — GLUCOSE, CAPILLARY
Glucose-Capillary: 89 mg/dL (ref 65–99)
Glucose-Capillary: 109 mg/dL — ABNORMAL HIGH (ref 65–99)

## 2016-09-24 SURGERY — LIGATION, FALLOPIAN TUBE, POSTPARTUM
Anesthesia: Epidural | Laterality: Bilateral | Wound class: Clean Contaminated

## 2016-09-24 MED ORDER — LIDOCAINE-EPINEPHRINE (PF) 2 %-1:200000 IJ SOLN
INTRAMUSCULAR | Status: DC | PRN
  Administered 2016-09-24 (×2): 5 mL via EPIDURAL

## 2016-09-24 MED ORDER — EPHEDRINE 5 MG/ML INJ: 10.0000 mg | INTRAVENOUS | Status: DC | PRN

## 2016-09-24 MED ORDER — MIDAZOLAM HCL 2 MG/2ML IJ SOLN
INTRAMUSCULAR | Status: AC
  Filled 2016-09-24: qty 2

## 2016-09-24 MED ORDER — PHENYLEPHRINE 40 MCG/ML (10ML) SYRINGE FOR IV PUSH (FOR BLOOD PRESSURE SUPPORT): 80.0000 ug | PREFILLED_SYRINGE | INTRAVENOUS | Status: DC | PRN

## 2016-09-24 MED ORDER — OXYTOCIN 40 UNITS IN LACTATED RINGERS INFUSION - SIMPLE MED: 2.5000 [IU]/h | INTRAVENOUS | Status: DC

## 2016-09-24 MED ORDER — BENZOCAINE-MENTHOL 20-0.5 % EX AERO: 1.0000 "application " | INHALATION_SPRAY | CUTANEOUS | Status: DC | PRN

## 2016-09-24 MED ORDER — LIDOCAINE HCL (PF) 1 % IJ SOLN
INTRAMUSCULAR | Status: AC
  Filled 2016-09-24: qty 30

## 2016-09-24 MED ORDER — LIDOCAINE-EPINEPHRINE (PF) 2 %-1:200000 IJ SOLN
INTRAMUSCULAR | Status: AC
  Filled 2016-09-24: qty 20

## 2016-09-24 MED ORDER — LACTATED RINGERS IV SOLN
500.0000 mL | Freq: Once | INTRAVENOUS | Status: DC
Start: 2016-09-24 — End: 2016-09-24

## 2016-09-24 MED ORDER — ZOLPIDEM TARTRATE 5 MG PO TABS
5.0000 mg | ORAL_TABLET | Freq: Every evening | ORAL | Status: DC | PRN
  Administered 2016-09-24: 5 mg via ORAL
  Filled 2016-09-24: qty 1

## 2016-09-24 MED ORDER — FENTANYL 2.5 MCG/ML BUPIVACAINE 1/10 % EPIDURAL INFUSION (WH - ANES): 14.0000 mL/h | INTRAMUSCULAR | Status: DC | PRN

## 2016-09-24 MED ORDER — OXYCODONE-ACETAMINOPHEN 5-325 MG PO TABS
1.0000 | ORAL_TABLET | Freq: Four times a day (QID) | ORAL | Status: DC | PRN
  Administered 2016-09-24 – 2016-09-25 (×3): 2 via ORAL
  Filled 2016-09-24 (×3): qty 2

## 2016-09-24 MED ORDER — FENTANYL CITRATE (PF) 100 MCG/2ML IJ SOLN: 25.0000 ug | INTRAMUSCULAR | Status: DC | PRN

## 2016-09-24 MED ORDER — PENICILLIN G POTASSIUM 5000000 UNITS IJ SOLR
5.0000 10*6.[IU] | Freq: Once | INTRAVENOUS | Status: DC
  Filled 2016-09-24 (×2): qty 5

## 2016-09-24 MED ORDER — IBUPROFEN 600 MG PO TABS
600.0000 mg | ORAL_TABLET | Freq: Four times a day (QID) | ORAL | Status: DC
  Administered 2016-09-24 – 2016-09-25 (×3): 600 mg via ORAL
  Filled 2016-09-24 (×3): qty 1

## 2016-09-24 MED ORDER — ONDANSETRON HCL 4 MG/2ML IJ SOLN
INTRAMUSCULAR | Status: AC
  Filled 2016-09-24: qty 2

## 2016-09-24 MED ORDER — DIPHENHYDRAMINE HCL 50 MG/ML IJ SOLN: 12.5000 mg | INTRAMUSCULAR | Status: DC | PRN

## 2016-09-24 MED ORDER — SOD CITRATE-CITRIC ACID 500-334 MG/5ML PO SOLN
ORAL | Status: AC
  Filled 2016-09-24: qty 15

## 2016-09-24 MED ORDER — FENTANYL CITRATE (PF) 100 MCG/2ML IJ SOLN
INTRAMUSCULAR | Status: AC
  Filled 2016-09-24: qty 2

## 2016-09-24 MED ORDER — FENTANYL CITRATE (PF) 100 MCG/2ML IJ SOLN
50.0000 ug | INTRAMUSCULAR | Status: DC | PRN
  Administered 2016-09-24 (×2): 50 ug via INTRAVENOUS
  Filled 2016-09-24 (×2): qty 2

## 2016-09-24 MED ORDER — OXYTOCIN BOLUS FROM INFUSION
500.0000 mL | Freq: Once | INTRAVENOUS | Status: AC
  Administered 2016-09-24: 500 mL via INTRAVENOUS

## 2016-09-24 MED ORDER — LIDOCAINE HCL (PF) 1 % IJ SOLN
INTRAMUSCULAR | Status: DC | PRN
  Administered 2016-09-24: 4 mL via EPIDURAL

## 2016-09-24 MED ORDER — DIPHENHYDRAMINE HCL 25 MG PO CAPS: 25.0000 mg | ORAL_CAPSULE | Freq: Four times a day (QID) | ORAL | Status: DC | PRN

## 2016-09-24 MED ORDER — MISOPROSTOL 200 MCG PO TABS
ORAL_TABLET | ORAL | Status: AC
  Administered 2016-09-24: 800 ug
  Filled 2016-09-24: qty 1

## 2016-09-24 MED ORDER — ONDANSETRON HCL 4 MG/2ML IJ SOLN
INTRAMUSCULAR | Status: DC | PRN
  Administered 2016-09-24: 4 mg via INTRAVENOUS

## 2016-09-24 MED ORDER — MIDAZOLAM HCL 5 MG/5ML IJ SOLN
INTRAMUSCULAR | Status: DC | PRN
  Administered 2016-09-24 (×2): 1 mg via INTRAVENOUS

## 2016-09-24 MED ORDER — LACTATED RINGERS IV SOLN
INTRAVENOUS | Status: DC
  Administered 2016-09-24 (×2): via INTRAVENOUS

## 2016-09-24 MED ORDER — LACTATED RINGERS IV SOLN
INTRAVENOUS | Status: DC | PRN
  Administered 2016-09-24: 15:00:00 via INTRAVENOUS

## 2016-09-24 MED ORDER — PROMETHAZINE HCL 25 MG/ML IJ SOLN: 6.2500 mg | INTRAMUSCULAR | Status: DC | PRN

## 2016-09-24 MED ORDER — LIDOCAINE HCL (PF) 1 % IJ SOLN: 30.0000 mL | INTRAMUSCULAR | Status: DC | PRN

## 2016-09-24 MED ORDER — DIBUCAINE 1 % RE OINT: 1.0000 "application " | TOPICAL_OINTMENT | RECTAL | Status: DC | PRN

## 2016-09-24 MED ORDER — PRENATAL MULTIVITAMIN CH: 1.0000 | ORAL_TABLET | Freq: Every day | ORAL | Status: DC

## 2016-09-24 MED ORDER — SOD CITRATE-CITRIC ACID 500-334 MG/5ML PO SOLN
30.0000 mL | ORAL | Status: DC | PRN
  Administered 2016-09-24 (×2): 30 mL via ORAL
  Filled 2016-09-24: qty 15

## 2016-09-24 MED ORDER — FENTANYL 2.5 MCG/ML BUPIVACAINE 1/10 % EPIDURAL INFUSION (WH - ANES)
14.0000 mL/h | INTRAMUSCULAR | Status: DC | PRN
  Administered 2016-09-24: 14 mL/h via EPIDURAL
  Filled 2016-09-24: qty 100

## 2016-09-24 MED ORDER — TERBUTALINE SULFATE 1 MG/ML IJ SOLN: 0.2500 mg | Freq: Once | INTRAMUSCULAR | Status: DC | PRN

## 2016-09-24 MED ORDER — METHYLERGONOVINE MALEATE 0.2 MG/ML IJ SOLN
INTRAMUSCULAR | Status: AC
  Filled 2016-09-24: qty 1

## 2016-09-24 MED ORDER — ACETAMINOPHEN 325 MG PO TABS: 650.0000 mg | ORAL_TABLET | ORAL | Status: DC | PRN

## 2016-09-24 MED ORDER — COCONUT OIL OIL: 1.0000 "application " | TOPICAL_OIL | Status: DC | PRN

## 2016-09-24 MED ORDER — SENNOSIDES-DOCUSATE SODIUM 8.6-50 MG PO TABS
2.0000 | ORAL_TABLET | ORAL | Status: DC
  Administered 2016-09-25: 2 via ORAL
  Filled 2016-09-24: qty 2

## 2016-09-24 MED ORDER — WITCH HAZEL-GLYCERIN EX PADS: 1.0000 "application " | MEDICATED_PAD | CUTANEOUS | Status: DC | PRN

## 2016-09-24 MED ORDER — LACTATED RINGERS IV SOLN: 500.0000 mL | Freq: Once | INTRAVENOUS | Status: DC

## 2016-09-24 MED ORDER — SODIUM BICARBONATE 8.4 % IV SOLN
INTRAVENOUS | Status: AC
  Filled 2016-09-24: qty 50

## 2016-09-24 MED ORDER — PHENYLEPHRINE 40 MCG/ML (10ML) SYRINGE FOR IV PUSH (FOR BLOOD PRESSURE SUPPORT)
80.0000 ug | PREFILLED_SYRINGE | INTRAVENOUS | Status: DC | PRN
  Filled 2016-09-24: qty 10

## 2016-09-24 MED ORDER — OXYTOCIN 40 UNITS IN LACTATED RINGERS INFUSION - SIMPLE MED
1.0000 m[IU]/min | INTRAVENOUS | Status: DC
  Administered 2016-09-24: 2 m[IU]/min via INTRAVENOUS
  Administered 2016-09-24: 22 m[IU]/min via INTRAVENOUS
  Filled 2016-09-24: qty 1000

## 2016-09-24 MED ORDER — LACTATED RINGERS IV SOLN: INTRAVENOUS | Status: DC

## 2016-09-24 MED ORDER — MEPERIDINE HCL 25 MG/ML IJ SOLN: 6.2500 mg | INTRAMUSCULAR | Status: DC | PRN

## 2016-09-24 MED ORDER — BUPIVACAINE HCL 0.5 % IJ SOLN
INTRAMUSCULAR | Status: DC | PRN
  Administered 2016-09-24: 30 mL

## 2016-09-24 MED ORDER — BUPIVACAINE HCL (PF) 0.5 % IJ SOLN
INTRAMUSCULAR | Status: AC
  Filled 2016-09-24: qty 30

## 2016-09-24 MED ORDER — ONDANSETRON HCL 4 MG/2ML IJ SOLN: 4.0000 mg | Freq: Four times a day (QID) | INTRAMUSCULAR | Status: DC | PRN

## 2016-09-24 MED ORDER — TETANUS-DIPHTH-ACELL PERTUSSIS 5-2.5-18.5 LF-MCG/0.5 IM SUSP: 0.5000 mL | Freq: Once | INTRAMUSCULAR | Status: DC

## 2016-09-24 MED ORDER — SIMETHICONE 80 MG PO CHEW: 80.0000 mg | CHEWABLE_TABLET | ORAL | Status: DC | PRN

## 2016-09-24 MED ORDER — LACTATED RINGERS IV SOLN: 500.0000 mL | INTRAVENOUS | Status: DC | PRN

## 2016-09-24 MED ORDER — FENTANYL CITRATE (PF) 100 MCG/2ML IJ SOLN
INTRAMUSCULAR | Status: DC | PRN
  Administered 2016-09-24 (×2): 50 ug via INTRAVENOUS

## 2016-09-24 MED ORDER — ONDANSETRON HCL 4 MG/2ML IJ SOLN: 4.0000 mg | INTRAMUSCULAR | Status: DC | PRN

## 2016-09-24 MED ORDER — ONDANSETRON HCL 4 MG PO TABS: 4.0000 mg | ORAL_TABLET | ORAL | Status: DC | PRN

## 2016-09-24 MED ORDER — PENICILLIN G POT IN DEXTROSE 60000 UNIT/ML IV SOLN
3.0000 10*6.[IU] | INTRAVENOUS | Status: DC
  Administered 2016-09-24: 3 10*6.[IU] via INTRAVENOUS
  Filled 2016-09-24 (×5): qty 50

## 2016-09-24 SURGICAL SUPPLY — 23 items
APL SKNCLS STERI-STRIP NONHPOA (GAUZE/BANDAGES/DRESSINGS) ×1
BENZOIN TINCTURE PRP APPL 2/3 (GAUZE/BANDAGES/DRESSINGS) ×3 IMPLANT
CLIP FILSHIE TUBAL LIGA STRL (Clip) ×4 IMPLANT
CLOSURE WOUND 1/2 X4 (GAUZE/BANDAGES/DRESSINGS) ×1
CLOTH BEACON ORANGE TIMEOUT ST (SAFETY) ×3 IMPLANT
DRSG OPSITE POSTOP 3X4 (GAUZE/BANDAGES/DRESSINGS) ×3 IMPLANT
DURAPREP 26ML APPLICATOR (WOUND CARE) ×3 IMPLANT
GLOVE BIOGEL PI IND STRL 7.0 (GLOVE) ×3 IMPLANT
GLOVE BIOGEL PI INDICATOR 7.0 (GLOVE) ×6
GLOVE ECLIPSE 7.0 STRL STRAW (GLOVE) ×3 IMPLANT
GOWN STRL REUS W/TWL LRG LVL3 (GOWN DISPOSABLE) ×6 IMPLANT
GOWN STRL REUS W/TWL XL LVL3 (GOWN DISPOSABLE) ×3 IMPLANT
NEEDLE HYPO 22GX1.5 SAFETY (NEEDLE) ×3 IMPLANT
NS IRRIG 1000ML POUR BTL (IV SOLUTION) ×3 IMPLANT
PACK ABDOMINAL MINOR (CUSTOM PROCEDURE TRAY) ×3 IMPLANT
PROTECTOR NERVE ULNAR (MISCELLANEOUS) ×3 IMPLANT
STRIP CLOSURE SKIN 1/2X4 (GAUZE/BANDAGES/DRESSINGS) ×2 IMPLANT
SUT VIC AB 0 CT1 27 (SUTURE) ×3
SUT VIC AB 0 CT1 27XBRD ANBCTR (SUTURE) ×1 IMPLANT
SUT VIC AB 4-0 PS2 27 (SUTURE) ×3 IMPLANT
SYR CONTROL 10ML LL (SYRINGE) ×3 IMPLANT
TOWEL OR 17X24 6PK STRL BLUE (TOWEL DISPOSABLE) ×6 IMPLANT
TRAY FOLEY CATH 16FR SILVER (SET/KITS/TRAYS/PACK) ×3 IMPLANT

## 2016-09-24 NOTE — Consult Note (Signed)
Neonatology Note:   Attendance at Delivery:    I was asked by Dr. Ihor Dow to attend this vaginal delivery by IOL at 34 0/[redacted] weeks EGA twins. The mother is a 31 y.o. [phone] with mono-di twins. Pregnancy complicated by concern for GHTN/pre-eclampsia and concerning elevated dopplers in B, GDM, drug use (+tobacco use h/o cocaine abuse with recent negative UDSs), and bipolar.  S/p BTMZ.  GBS + with good prenatal care. ROM 3 hours before delivery with one dose PCN <4h PTD, fluid clear.   Infant A received 60sec DCC, was vigorous with good spontaneous cry and tone. Needed minimal bulb suctioning and 1-2 minutes of BBO2 for low Sao2.  Ap 8/9. Lungs clearing to ausc in DR.   Infant A received 60sec DCC, was vigorous with good spontaneous cry and tone. Needed only minimal bulb suctioning. Sao2 appropriate. Ap 8/9. Lungs clearing to ausc in DR.  Both to NICU for further management.  Mother and father updated.    Monia Sabal Katherina Mires, MD

## 2016-09-24 NOTE — Transfer of Care (Signed)
Immediate Anesthesia Transfer of Care Note  Patient: Robin Horn Eye  Procedure(s) Performed: Procedure(s): POST PARTUM TUBAL LIGATION (Bilateral)  Patient Location: PACU  Anesthesia Type:Epidural  Level of Consciousness: awake, alert  and oriented  Airway & Oxygen Therapy: Patient Spontanous Breathing  Post-op Assessment: Report given to RN and Post -op Vital signs reviewed and stable  Post vital signs: Reviewed and stable  Last Vitals:  Vitals:   09/24/16 1400 09/24/16 1415  BP: 129/89 (!) 144/98  Pulse: 95 98  Resp: 16 16  Temp:      Last Pain:  Vitals:   09/24/16 1415  TempSrc:   PainSc: 0-No pain      Patients Stated Pain Goal: 4 (32/35/57 3220)  Complications: No apparent anesthesia complications

## 2016-09-24 NOTE — Anesthesia Postprocedure Evaluation (Signed)
Anesthesia Post Note  Patient: Robin Horn  Procedure(s) Performed: * No procedures listed *  Patient location during evaluation: Mother Baby Anesthesia Type: Epidural Level of consciousness: awake and alert Pain management: pain level controlled Vital Signs Assessment: post-procedure vital signs reviewed and stable Respiratory status: spontaneous breathing, nonlabored ventilation and respiratory function stable Cardiovascular status: stable Postop Assessment: no headache, no backache and epidural receding Anesthetic complications: no        Last Vitals:  Vitals:   09/24/16 1630 09/24/16 1645  BP: (!) 148/96 (!) 148/96  Pulse: (!) 104 85  Resp: 15 (!) 21  Temp:      Last Pain:  Vitals:   09/24/16 1415  TempSrc:   PainSc: 0-No pain   Pain Goal: Patients Stated Pain Goal: 4 (09/24/16 0550)               [name]

## 2016-09-24 NOTE — Anesthesia Procedure Notes (Signed)
Epidural Patient location during procedure: OB Start time: 09/24/2016 8:29 AM End time: 09/24/2016 8:35 AM  Staffing Anesthesiologist: Suella Broad D Performed: anesthesiologist   Preanesthetic Checklist Completed: patient identified, site marked, surgical consent, pre-op evaluation, timeout performed, IV checked, risks and benefits discussed and monitors and equipment checked  Epidural Patient position: sitting Prep: ChloraPrep Patient monitoring: heart rate, continuous pulse ox and blood pressure Approach: midline Location: L3-L4 Injection technique: LOR saline  Needle:  Needle type: Tuohy  Needle gauge: 17 G Needle length: 9 cm Catheter type: closed end flexible Catheter size: 20 Guage Test dose: negative and 1.5% lidocaine  Assessment Events: blood not aspirated, injection not painful, no injection resistance and no paresthesia  Additional Notes LOR @ 5.5  Patient identified. Risks/Benefits/Options discussed with patient including but not limited to bleeding, infection, nerve damage, paralysis, failed block, incomplete pain control, headache, blood pressure changes, nausea, vomiting, reactions to medications, itching and postpartum back pain. Confirmed with bedside nurse the patient's most recent platelet count. Confirmed with patient that they are not currently taking any anticoagulation, have any bleeding history or any family history of bleeding disorders. Patient expressed understanding and wished to proceed. All questions were answered. Sterile technique was used throughout the entire procedure. Please see nursing notes for vital signs. Test dose was given through epidural catheter and negative prior to continuing to dose epidural or start infusion. Warning signs of high block given to the patient including shortness of breath, tingling/numbness in hands, complete motor block, or any concerning symptoms with instructions to call for help. Patient was given instructions on  fall risk and not to get out of bed. All questions and concerns addressed with instructions to call with any issues or inadequate analgesia.    Reason for block:procedure for pain

## 2016-09-24 NOTE — Anesthesia Preprocedure Evaluation (Signed)
Anesthesia Evaluation  Patient identified by MRN, date of birth, ID band Patient awake    Reviewed: Allergy & Precautions, NPO status , Patient's Chart, lab work & pertinent test results  Airway Mallampati: II  TM Distance: >3 FB Neck ROM: Full    Dental no notable dental hx.    Pulmonary Current Smoker,    Pulmonary exam normal        Cardiovascular hypertension,  Rhythm:Regular Rate:Normal     Neuro/Psych  Headaches, PSYCHIATRIC DISORDERS Anxiety Depression Bipolar Disorder    GI/Hepatic Neg liver ROS, GERD  Medicated,  Endo/Other  diabetes  Renal/GU negative Renal ROS  negative genitourinary   Musculoskeletal negative musculoskeletal ROS (+)   Abdominal   Peds negative pediatric ROS (+)  Hematology negative hematology ROS (+)   Anesthesia Other Findings   Reproductive/Obstetrics                             Lab Results  Component Value Date   WBC 10.0 09/24/2016   HGB 11.9 (L) 09/24/2016   HCT 37.2 09/24/2016   MCV 89.2 09/24/2016   PLT 179 09/24/2016     Anesthesia Physical  Anesthesia Plan  ASA: II  Anesthesia Plan: Epidural   Post-op Pain Management:    Induction: Intravenous  Airway Management Planned: Natural Airway  Additional Equipment:   Intra-op Plan:   Post-operative Plan:   Informed Consent: I have reviewed the patients History and Physical, chart, labs and discussed the procedure including the risks, benefits and alternatives for the proposed anesthesia with the patient or authorized representative who has indicated his/her understanding and acceptance.     Plan Discussed with: CRNA  Anesthesia Plan Comments:         Anesthesia Quick Evaluation

## 2016-09-24 NOTE — Progress Notes (Signed)
CBG @ 2352 was 106. CBG not transmitting over to computer

## 2016-09-24 NOTE — Op Note (Signed)
09/20/2016 - 09/24/2016  3:55 PM  PATIENT:  Robin Horn  31 y.o. female  PRE-OPERATIVE DIAGNOSIS:  undesired fertility  POST-OPERATIVE DIAGNOSIS:  undesired fertility  PROCEDURE:  Procedure(s): POST PARTUM TUBAL LIGATION (Bilateral)  SURGEON:  Surgeon(s) and Role:    * Lavonia Drafts, MD - Primary  ANESTHESIA:   epidural  EBL:  Total I/O In: 200 [I.V.:200] Out: 602 [Urine:500; Blood:102]  BLOOD ADMINISTERED:none  DRAINS: none   LOCAL MEDICATIONS USED:  MARCAINE     SPECIMEN:  No Specimen  DISPOSITION OF SPECIMEN:  N/A  COUNTS:  YES  TOURNIQUET:  * No tourniquets in log *  DICTATION: .Note written in EPIC  PLAN OF CARE: Pt has active admit order  PATIENT DISPOSITION:  PACU - hemodynamically stable.   Delay start of Pharmacological VTE agent (>24hrs) due to surgical blood loss or risk of bleeding: yes  Complications- none immediate.  INDICATIONS: 31 y.o. [id number]  with undesired fertility,status post vaginal delivery, desires permanent sterilization.  Other reversible forms of contraception were discussed with patient; she declines all other modalities. Risks of procedure discussed with patient including but not limited to: risk of regret, permanence of method, bleeding, infection, injury to surrounding organs and need for additional procedures.  Failure risk of 0.5-1% with increased risk of ectopic gestation if pregnancy occurs was also discussed with patient.     FINDINGS:  Normal uterus, tubes, and ovaries.  PROCEDURE DETAILS: The patient was taken to the operating room where her epidural anesthesia was dosed up to surgical level and found to be adequate.  She was then placed in the dorsal supine position and prepped and draped in sterile fashion.  After an adequate timeout was performed, attention was turned to the patient's abdomen where a small transverse skin incision was made under the umbilical fold. The incision was taken down to the layer of fascia  using the scalpel, and fascia was incised, and extended bilaterally using Mayo scissors. The peritoneum was entered in a sharp fashion. Attention was then turned to the patient's uterus, and left fallopian tube was identified and followed out to the fimbriated end.  A Filshie clip was placed on the left fallopian tube about 3 cm from the cornual attachment, with care given to incorporate the underlying mesosalpinx.  A similar process was carried out on the right side allowing for bilateral tubal sterilization.  Good hemostasis was noted overall.  The instruments were then removed from the patient's abdomen and the fascial incision was repaired with 0 Vicryl, and the skin was closed with a 4-0 Vicryl subcuticular stitch. The patient tolerated the procedure well.  Instrument, sponge, and needle counts were correct times two.  The patient was then taken to the recovery room awake and in stable condition.  Demetrios Byron L. Harraway-Smith, M.D., Cherlynn June

## 2016-09-24 NOTE — Brief Op Note (Signed)
09/20/2016 - 09/24/2016  3:55 PM  PATIENT:  Robin Horn  31 y.o. female  PRE-OPERATIVE DIAGNOSIS:  undesired fertility  POST-OPERATIVE DIAGNOSIS:  undesired fertility  PROCEDURE:  Procedure(s): POST PARTUM TUBAL LIGATION (Bilateral)  SURGEON:  Surgeon(s) and Role:    * Lavonia Drafts, MD - Primary  ANESTHESIA:   epidural  EBL:  Total I/O In: 200 [I.V.:200] Out: 602 [Urine:500; Blood:102]  BLOOD ADMINISTERED:none  DRAINS: none   LOCAL MEDICATIONS USED:  MARCAINE     SPECIMEN:  No Specimen  DISPOSITION OF SPECIMEN:  N/A  COUNTS:  YES  TOURNIQUET:  * No tourniquets in log *  DICTATION: .Note written in EPIC  PLAN OF CARE: Pt has active admit order  PATIENT DISPOSITION:  PACU - hemodynamically stable.   Delay start of Pharmacological VTE agent (>24hrs) due to surgical blood loss or risk of bleeding: yes  Complications- none immediate  Robin Horn, M.D., Cherlynn June

## 2016-09-24 NOTE — Progress Notes (Signed)
Labor Progress Note  Robin Horn is a 31 y.o. [id number] at [redacted]w[redacted]d  admitted for  induction of labor due to gestational hypertension vs. preeclampsia and Mo-Di twin gestation.  S: Patient was sleeping comfortably upon entering room. Patient was woken for cervical exam. She had no complaints.   O:  BP 138/85   Pulse 92   Temp 98.2 F (36.8 C) (Oral)   Resp 16   Ht 5\' 3"  (1.6 m)   Wt 89.8 kg (198 lb)   LMP 01/14/2016 (Approximate)   SpO2 98%   BMI 35.07 kg/m    FHT A:  FHR: 150 bpm, variability: moderate,  accelerations:  Present,  decelerations:  Absent  FHT B:  FHR: 145 bpm, variability: moderate,  accelerations:  Present,  decelerations:  Absent UC:   irregular SVE:   Dilation: 2.5 Effacement (%): 60 Station: Ballotable Exam by:: Renato Battles CNM SROM/AROM: Intact  Pitocin @ 12 mu/min  Labs: Lab Results  Component Value Date   WBC 13.5 (H) 09/23/2016   HGB 11.2 (L) 09/23/2016   HCT 34.9 (L) 09/23/2016   MCV 89.3 09/23/2016   PLT 196 09/23/2016    Assessment / Plan: 31 y.o. [id number] [redacted]w[redacted]d  Induction of labor due to gestational hypertension vs. preeclampsia and twin    Labor: Progressing normally Fetal Wellbeing:  Category I Pain Control:  Fentanyl 100 mcg every 1 hr, will place epidural upon request in active labor Anticipated MOD:  NSVD  Active management - Continue Pitocin as ordered GBS+, PCN q4h, to start in active labor  Salvadore Dom Medical Student 09/24/2016 6:03 AM   I confirm that I have verified the information documented in the medical student's note and that I have also personally reperformed the physical exam and all medical decision making activities.  Laury Deep, CNM 09/24/2016 6:30 AM

## 2016-09-24 NOTE — Progress Notes (Signed)
Labor Progress Note  Robin Horn is a 31 y.o. [id number] at [redacted]w[redacted]d  admitted for induction of labor due to gestational hypertension vs. preeclampsia and twin.  S: Patient appears comfortable at this time. No complaints.   O:  BP (!) 148/101 (BP Location: Right Arm)   Pulse 89   Temp 98.5 F (36.9 C) (Oral)   Resp 16   Ht 5\' 3"  (1.6 m)   Wt 89.8 kg (198 lb)   LMP 01/14/2016 (Approximate)   SpO2 98%   BMI 35.07 kg/m   No intake/output data recorded.  FHT A:  FHR: 150 bpm, variability: moderate,  accelerations:  Present,  decelerations:  Absent  FHT B:  FHR: 150 bpm, variability: moderate,  accelerations:  Present,  decelerations:  Absent UC:   irregular SVE:   Dilation: 2.5 Effacement (%): 60 Station: Ballotable Exam by:: Renato Battles CNM SROM/AROM: Intact  Pitocin @ 2 mu/min  Labs: Lab Results  Component Value Date   WBC 13.5 (H) 09/23/2016   HGB 11.2 (L) 09/23/2016   HCT 34.9 (L) 09/23/2016   MCV 89.3 09/23/2016   PLT 196 09/23/2016    Assessment / Plan: 32 y.o. [id number] [redacted]w[redacted]d  Induction of labor due to gestation hypertension vs. preeclampsia and twins,  progressing well on pitocin  Labor: Progressing normally Fetal Wellbeing:  Category I Pain Control:  currently no pain medication, will place epidural upon request Anticipated MOD:  NSVD  Active management - Low-dose pitocin GBS+, PCN q4h  Warnell Forester [facility] Medical Student 09/24/2016 1:57 AM   I confirm that I have verified the information documented in the medical student's note and that I have also personally reperformed the physical exam and all medical decision making activities.  Laury Deep MSN, CNM 09/24/2016 6:09 AM

## 2016-09-24 NOTE — Anesthesia Postprocedure Evaluation (Signed)
Anesthesia Post Note  Patient: Robin Horn  Procedure(s) Performed: Procedure(s) (LRB): POST PARTUM TUBAL LIGATION (Bilateral)  Patient location during evaluation: PACU Anesthesia Type: Epidural Level of consciousness: oriented and awake and alert Pain management: pain level controlled Vital Signs Assessment: post-procedure vital signs reviewed and stable Respiratory status: spontaneous breathing, respiratory function stable and patient connected to nasal cannula oxygen Cardiovascular status: blood pressure returned to baseline and stable Postop Assessment: no headache, no backache and epidural receding Anesthetic complications: no        Last Vitals:  Vitals:   09/24/16 1630 09/24/16 1645  BP: (!) 148/96 (!) 148/96  Pulse: (!) 104 85  Resp: 15 (!) 21  Temp:      Last Pain:  Vitals:   09/24/16 1415  TempSrc:   PainSc: 0-No pain   Pain Goal: Patients Stated Pain Goal: 4 (09/24/16 0550)               [name]

## 2016-09-24 NOTE — Progress Notes (Signed)
S: Patient seen & examined for progress of labor. Patient comfortable with epidural. Feels pressure, no N/V.   O:  Vitals:   09/24/16 0851 09/24/16 0856 09/24/16 0900 09/24/16 0905  BP: (!) 138/93 (!) 142/94 (!) 138/100 134/86  Pulse: 89 88 100 88  Resp: 18 18 18 18   Temp:      TempSrc:      SpO2:      Weight:      Height:        Dilation: 2 Effacement (%): 60 Cervical Position: Posterior Station: Ballotable Presentation: Vertex Exam by:: Wendall Papa, RN   FHT:  Baby A  145 bpm, mod var, +accels, no decels Baby B 130 bmp, mod var, +accels, no decels TOCO: q10min   A/P: Pain: controlled with epidural Labor: progressing with pitocin FWB: Category I tracings for A and B  Continue expectant management Anticipate SVD

## 2016-09-24 NOTE — Anesthesia Preprocedure Evaluation (Signed)
Anesthesia Evaluation  Patient identified by MRN, date of birth, ID band Patient awake    Reviewed: Allergy & Precautions, Patient's Chart, lab work & pertinent test results  Airway Mallampati: II  TM Distance: >3 FB Neck ROM: Full    Dental no notable dental hx.    Pulmonary Current Smoker,    Pulmonary exam normal        Cardiovascular hypertension,  Rhythm:Regular Rate:Normal     Neuro/Psych  Headaches, PSYCHIATRIC DISORDERS Anxiety Depression Bipolar Disorder    GI/Hepatic Neg liver ROS, GERD  Medicated,  Endo/Other  diabetes  Renal/GU negative Renal ROS  negative genitourinary   Musculoskeletal negative musculoskeletal ROS (+)   Abdominal   Peds negative pediatric ROS (+)  Hematology negative hematology ROS (+)   Anesthesia Other Findings   Reproductive/Obstetrics                             Lab Results  Component Value Date   WBC 10.4 09/24/2016   HGB 11.4 (L) 09/24/2016   HCT 35.1 (L) 09/24/2016   MCV 89.1 09/24/2016   PLT 179 09/24/2016     Anesthesia Physical Anesthesia Plan  ASA: II  Anesthesia Plan: Epidural   Post-op Pain Management:    Induction:   Airway Management Planned:   Additional Equipment:   Intra-op Plan:   Post-operative Plan:   Informed Consent: I have reviewed the patients History and Physical, chart, labs and discussed the procedure including the risks, benefits and alternatives for the proposed anesthesia with the patient or authorized representative who has indicated his/her understanding and acceptance.     Plan Discussed with:   Anesthesia Plan Comments:         Anesthesia Quick Evaluation

## 2016-09-25 ENCOUNTER — Encounter (HOSPITAL_COMMUNITY): Payer: Self-pay | Admitting: *Deleted

## 2016-09-25 MED ORDER — OXYCODONE-ACETAMINOPHEN 5-325 MG PO TABS: 1.0000 | ORAL_TABLET | Freq: Four times a day (QID) | ORAL | 0 refills | Status: DC | PRN

## 2016-09-25 MED ORDER — IBUPROFEN 600 MG PO TABS: 600.0000 mg | ORAL_TABLET | Freq: Four times a day (QID) | ORAL | 0 refills | Status: DC

## 2016-09-25 NOTE — Plan of Care (Signed)
Problem: Activity: Goal: Ability to tolerate increased activity will improve Outcome: Completed/Met Date Met: 10/10/2016 Ambulates in the hall frequently and tolerates well.  Problem: Coping: Goal: Ability to cope will improve Outcome: Completed/Met Date Met: 10-10-16 Has been to see twin daughters and is happy with their progress.  Problem: Life Cycle: Goal: Risk for postpartum hemorrhage will decrease Outcome: Completed/Met Date Met: 10-10-16 Vaginal bleeding is within normal limits.  Problem: Nutritional: Goal: Dietary intake will improve Outcome: Completed/Met Date Met: 10/10/16 Tolerates a Regular diet well.  Goal: Mothers verbalization of comfort with breastfeeding process will improve Outcome: Completed/Met Date Met: October 10, 2016 Started pumping because babies are in NICU.Taught how to care for and use pump equipment with teach back.  Problem: Role Relationship: Goal: Ability to demonstrate positive interaction with newborn will improve Outcome: Completed/Met Date Met: 2016/10/10 Has been down to NICU several times to see babies.  Problem: Bowel/Gastric: Goal: Gastrointestinal status will improve Outcome: Completed/Met Date Met: 10/10/16 Has passed flatus.  Problem: Skin Integrity: Goal: Demonstration of wound healing without infection will improve Outcome: Completed/Met Date Met: 68/59/92 Has small umbilical island dressing that is intact.  Problem: Urinary Elimination: Goal: Ability to reestablish a normal urinary elimination pattern will improve Outcome: Completed/Met Date Met: 10/10/2016 Voiding qs amount of urine without any difficulty.

## 2016-09-25 NOTE — Addendum Note (Signed)
Addendum  created 09/25/16 1028 by Hewitt Blade, CRNA   Sign clinical note

## 2016-09-25 NOTE — Addendum Note (Signed)
Addendum  created 09/25/16 1027 by Hewitt Blade, CRNA   Sign clinical note

## 2016-09-25 NOTE — Discharge Instructions (Signed)
Postpartum Care After Vaginal Delivery °The period of time right after you deliver your newborn is called the postpartum period. °What kind of medical care will I receive? °· You may continue to receive fluids and medicines through an IV tube inserted into one of your veins. °· If an incision was made near your vagina (episiotomy) or if you had some vaginal tearing during delivery, cold compresses may be placed on your episiotomy or your tear. This helps to reduce pain and swelling. °· You may be given a squirt bottle to use when you go to the bathroom. You may use this until you are comfortable wiping as usual. To use the squirt bottle, follow these steps: °¨ Before you urinate, fill the squirt bottle with warm water. Do not use hot water. °¨ After you urinate, while you are sitting on the toilet, use the squirt bottle to rinse the area around your urethra and vaginal opening. This rinses away any urine and blood. °¨ You may do this instead of wiping. As you start healing, you may use the squirt bottle before wiping yourself. Make sure to wipe gently. °¨ Fill the squirt bottle with clean water every time you use the bathroom. °· You will be given sanitary pads to wear. °How can I expect to feel? °· You may not feel the need to urinate for several hours after delivery. °· You will have some soreness and pain in your abdomen and vagina. °· If you are breastfeeding, you may have uterine contractions every time you breastfeed for up to several weeks postpartum. Uterine contractions help your uterus return to its normal size. °· It is normal to have vaginal bleeding (lochia) after delivery. The amount and appearance of lochia is often similar to a menstrual period in the first week after delivery. It will gradually decrease over the next few weeks to a dry, yellow-brown discharge. For most women, lochia stops completely by 6-8 weeks after delivery. Vaginal bleeding can vary from woman to woman. °· Within the first few  days after delivery, you may have breast engorgement. This is when your breasts feel heavy, full, and uncomfortable. Your breasts may also throb and feel hard, tightly stretched, warm, and tender. After this occurs, you may have milk leaking from your breasts. Your health care provider can help you relieve discomfort due to breast engorgement. Breast engorgement should go away within a few days. °· You may feel more sad or worried than normal due to hormonal changes after delivery. These feelings should not last more than a few days. If these feelings do not go away after several days, speak with your health care provider. °How should I care for myself? °· Tell your health care provider if you have pain or discomfort. °· Drink enough water to keep your urine clear or pale yellow. °· Wash your hands thoroughly with soap and water for at least 20 seconds after changing your sanitary pads, after using the toilet, and before holding or feeding your baby. °· If you are not breastfeeding, avoid touching your breasts a lot. Doing this can make your breasts produce more milk. °· If you become weak or lightheaded, or you feel like you might faint, ask for help before: °¨ Getting out of bed. °¨ Showering. °· Change your sanitary pads frequently. Watch for any changes in your flow, such as a sudden increase in volume, a change in color, the passing of large blood clots. If you pass a blood clot from your vagina, save it   to show to your health care provider. Do not flush blood clots down the toilet without having your health care provider look at them.  Make sure that all your vaccinations are up to date. This can help protect you and your baby from getting certain diseases. You may need to have immunizations done before you leave the hospital.  If desired, talk with your health care provider about methods of family planning or birth control (contraception). How can I start bonding with my baby? Spending as much time as  possible with your baby is very important. During this time, you and your baby can get to know each other and develop a bond. Having your baby stay with you in your room (rooming in) can give you time to get to know your baby. Rooming in can also help you become comfortable caring for your baby. Breastfeeding can also help you bond with your baby. How can I plan for returning home with my baby?  Make sure that you have a car seat installed in your vehicle.  Your car seat should be checked by a certified car seat installer to make sure that it is installed safely.  Make sure that your baby fits into the car seat safely.  Ask your health care provider any questions you have about caring for yourself or your baby. Make sure that you are able to contact your health care provider with any questions after leaving the hospital. This information is not intended to replace advice given to you by your health care provider. Make sure you discuss any questions you have with your health care provider. Document Released: 02/12/2007 Document Revised: 09/20/2015 Document Reviewed: 03/22/2015 Elsevier Interactive Patient Education  2017 [place] Breastfeeding Twins or Multiples Choosing to breastfeed your babies has many benefits:  It helps your uterus return to its original size faster.  It releases hormones that relax you.  It saves money and time. Your milk is available at the right temperature and whenever your babies are ready to feed.  It ensures your babies get the best nutrition.  It creates a unique bond between you and each of your babies. Breast milk is beneficial for all babies, but it is especially beneficial to multiples, who are often small at birth and need all the advantages breast milk can provide. Mothers of multiples typically produce enough milk for all their babies. When should I start breastfeeding? Nurse as soon as possible and as often as your babies want to be nursed. This  will cause your body to produce enough milk for all your babies. Work with a Science writer as soon as possible. It is important to assess each infant at the breast to make sure they can latch and feed. If your babies were born prematurely and are unable to nurse, you can pump your breasts and freeze the milk until your babies are ready to feed at the breast. To make sure that your body makes enough milk, empty your breast at least 8-10 times in a 24-hour period. Ask a lactation specialist to help you choose an effective breast pump and for guidance in helping your babies latch on to and feed from the breast when they are ready. Should I nurse my babies together? It is up to you whether you nurse your babies together or separately. However, many mothers of multiples find that it is easier and time-saving to nurse two babies at the same time. Nursing babies together may also help establish your milk supply.  Nursing two babies at the same time can be tricky at first, but it often gets easier as the babies get older and more experienced at latching on to the breast. When nursing your babies together:  Ask your nurse or lactation specialist to suggest tips on positioning. There are several positions and holds that make it easier to nurse more than one baby at a time.  Try placing pillows under your arms and legs and under your babies for comfort. Use a nursing pillow specially designed for multiples.  Switch your babies from one side to the other at alternate feedings. For example, if baby A feeds from the right breast and baby B feeds from the left breast, then at the next feeding baby A should take the left breast and baby B the right breast. This ensures that both breasts get equal amounts of stimulation. It also allows the stronger sucking baby to increase the milk supply for the baby whose suck is weaker. What are some tips to increase my success?  A good latch helps the babies empty the breasts.  It also prevents sore nipples. If you are nursing babies together and one of the babies is having difficulty latching or sucking, try nursing that baby separately. That way you can give her or him your full attention.  If you are nursing babies separately and one of the babies is having difficulty feeding, it may help to nurse that baby and his or her sibling together. The baby with the stronger or more effective suck will stimulate the mothers milk to flow faster.  Try not to give your babies bottles and pacifiers during the early weeks of breastfeeding. Avoiding these encourages effective sucking patterns and helps establish a good milk supply. You should not need supplemental feedings if you empty your breasts with each feeding.  Keep track of each babys stools and wet diapers for the first 6 weeks to make sure each baby is getting enough milk. Signs that your babies are getting enough milk include:  Wetting at least 1-2 diapers in the first 24 hours after birth.  Wetting at least 5-6 diapers every 24 hours for the first week after birth. The urine should be clear and pale yellow by 5 days after birth.  Wetting 6-8 diapers every 24 hours as your babies grow and develop.  Producing a healthy amount of stool:  By the time your babies are 3 days old, they should produce at least 3 stools in a 24-hour period. The stools should be soft and yellow.  By the time your babies are 29 days old, they should produce at least 3 stools in a 24-hour period. The stools should be seedy and yellow.  Gaining a healthy amount of weight. Talk to your health care provider about how much weight your babies should be gaining.  If your babies do not get enough milk from breastfeeding alone, talk with a lactation specialist. It may be possible to breastfeed part of the time and supplement feedings with donated milk or formula.  Drink plenty of fluids so your urine is clear or pale yellow.  Eat a healthy diet that  includes fresh fruits and vegetables, whole grains, lean meat, fish, eggs, beans, nuts, and seeds, and low-fat dairy products. Summary  Breastfeeding is beneficial for twins, multiples, and their mothers.  Nurse your babies as soon as possible after birth. You may nurse two babies at one time.  Work with a lactation specialist to assess your babies80,  find positioning and breastfeeding strategies that work for you, and overcome any nursing challenges. This information is not intended to replace advice given to you by your health care provider. Make sure you discuss any questions you have with your health care provider. Document Released: 08/15/2004 Document Revised: 04/19/2016 Document Reviewed: 04/19/2016 Elsevier Interactive Patient Education  2017 [employer].

## 2016-09-25 NOTE — Anesthesia Postprocedure Evaluation (Signed)
Anesthesia Post Note  Patient: Robin Horn  Procedure(s) Performed: * No procedures listed *  Patient location during evaluation: Mother Baby Anesthesia Type: Epidural Level of consciousness: awake and alert and oriented Pain management: pain level controlled Vital Signs Assessment: post-procedure vital signs reviewed and stable Respiratory status: spontaneous breathing and nonlabored ventilation Cardiovascular status: stable Postop Assessment: no headache, patient able to bend at knees, no backache, no signs of nausea or vomiting, epidural receding and adequate PO intake Anesthetic complications: no        Last Vitals:  Vitals:   09/25/16 0625 09/25/16 0900  BP: 125/88 (!) 129/96  Pulse: 70 75  Resp: 15 16  Temp: 36.8 C 36.9 C    Last Pain:  Vitals:   09/25/16 0900  TempSrc: Oral  PainSc:    Pain Goal: Patients Stated Pain Goal: 2 (09/25/16 0623)               Jabier Mutton

## 2016-09-25 NOTE — Anesthesia Postprocedure Evaluation (Addendum)
Anesthesia Post Note  Patient: Robin Horn  Procedure(s) Performed: Procedure(s) (LRB): POST PARTUM TUBAL LIGATION (Bilateral)  Patient location during evaluation: Mother Baby Anesthesia Type: Epidural Level of consciousness: awake and alert and oriented Pain management: pain level controlled Respiratory status: spontaneous breathing and nonlabored ventilation Cardiovascular status: stable Postop Assessment: no headache, patient able to bend at knees, no backache, no signs of nausea or vomiting, epidural receding and adequate PO intake Anesthetic complications: no        Last Vitals:  Vitals:   09/25/16 0625 09/25/16 0900  BP: 125/88 (!) 129/96  Pulse: 70 75  Resp: 15 16  Temp: 36.8 C 36.9 C    Last Pain:  Vitals:   09/25/16 0900  TempSrc: Oral  PainSc:    Pain Goal: Patients Stated Pain Goal: 2 (09/25/16 0623)               Jabier Mutton

## 2016-09-25 NOTE — Discharge Summary (Signed)
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 yo G413 300 4274at 384w0das admitted to the hospital 09/20/2016 for induction management of mono/di twin IUP with Preeclampsia. She was managed inpt until [redacted] weeks gestation. Indication for induction: Gestational hypertension and Preeclampsia.  Patient had an uncomplicated labor course as follows: Membrane Rupture Time/Date:    HoCrosby, Bevan0[[id number]]11:34 AM   HoTamea, Bai0[[id number]]11:34 AM ,   HoAltovise, Wahler0[[id number]]09/24/2016   HoPaisely, Brick0[[id number]]09/24/2016   Intrapartum Procedures: Episiotomy:    HoTravia, Onstad0[[id number]]None [1]   HoKatelynd, Blauvelt0[[id number]]None [1]                                         Lacerations:     HoKymberlee, Viger0[[id number]]None [1]   HoAlohilani, Levenhagen0[[id number]]None [1]  Patient had delivery of a Viable infants x2.  Information for the patient's newborn:  HoAshlynd, Michna0[[id number]]Delivery Method: Vaginal, Spontaneous Delivery (Filed from  Delivery Summary) Information for the patient's newborn:  HoNoreen, Mackintosh0[[id number]]Delivery Method: Vaginal, Spontaneous Delivery (Filed from Delivery Summary)     HoReginia, Battie0[[id number]]09/24/2016   HoKahlie, Deutscher0[[id number]]09/24/2016  Details of delivery can be found in separate delivery note.  Patient had a routine postpartum course. She had a PPBTL 2 hours after delivery. Patient is discharged home 09/25/16.  Physical exam  Vitals:   09/24/16 1830 09/24/16 2204 09/25/16 0212 09/25/16 0625  BP: (!) 154/88 135/80 (!) 135/92 125/88  Pulse: 80 86 96 70  Resp: 16 19 18 15   Temp: 98.8 F (37.1 C) 99 F (37.2 C) 99 F (37.2 C) 98.3 F (36.8 C)  TempSrc: Oral Oral Oral Oral  SpO2: 100% 97% 99% 99%  Weight:      Height:       General: alert, cooperative and no distress Lochia: appropriate Uterine Fundus: firm Incision: Healing well with no significant drainage DVT Evaluation: No evidence  of DVT seen on physical exam. Labs: Lab Results  Component Value Date   WBC 10.0 09/24/2016   HGB 11.9 (L) 09/24/2016   HCT 37.2 09/24/2016   MCV 89.2 09/24/2016   PLT 179 09/24/2016   CMP Latest Ref Rng & Units 09/23/2016  Glucose 65 - 99 mg/dL 81  BUN 6 - 20 mg/dL 8  Creatinine 0.44 - 1.00 mg/dL 0.58  Sodium 135 - 145 mmol/L 136  Potassium 3.5 - 5.1 mmol/L 3.8  Chloride 101 - 111 mmol/L 106  CO2 22 - 32 mmol/L 22  Calcium 8.9 - 10.3 mg/dL 8.5(L)  Total Protein 6.5 - 8.1 g/dL 5.5(L)  Total Bilirubin 0.3 - 1.2 mg/dL 0.3  Alkaline Phos 38 - 126 U/L 177(H)  AST 15 - 41 U/L 20  ALT 14 - 54 U/L 13(L)    Discharge instruction: per After Visit Summary and "Baby and Me Booklet".  After visit meds:  Allergies as of 09/25/2016      Reactions   Lactose Intolerance (gi) Diarrhea      Medication List    STOP taking these medications   ACCU-CHEK FASTCLIX LANCETS Misc   ACCU-CHEK NANO SMARTVIEW w/Device Kit   acetaminophen 500 MG tablet Commonly known  as:  TYLENOL   cyclobenzaprine 10 MG tablet Commonly known as:  FLEXERIL   glucose blood test strip Commonly known as:  ACCU-CHEK SMARTVIEW   ranitidine 150 MG tablet Commonly known as:  ZANTAC   zolpidem 5 MG tablet Commonly known as:  AMBIEN     TAKE these medications   butalbital-acetaminophen-caffeine 50-325-40 MG tablet Commonly known as:  FIORICET, ESGIC Take 1-2 tablets by mouth every 6 (six) hours as needed for headache.   ibuprofen 600 MG tablet Commonly known as:  ADVIL,MOTRIN Take 1 tablet (600 mg total) by mouth every 6 (six) hours.   oxyCODONE-acetaminophen 5-325 MG tablet Commonly known as:  PERCOCET/ROXICET Take 1-2 tablets by mouth every 6 (six) hours as needed.   PRE-NATAL FORMULA Tabs Take 1 tablet by mouth daily.   valACYclovir 1000 MG tablet Commonly known as:  VALTREX Take 1 tablet (1,000 mg total) by mouth 2 (two) times daily. Take for ten days.       Diet: low salt diet  Activity: Advance as tolerated. Pelvic rest for 6 weeks.   Outpatient follow up:2 weeks Follow up Appt:Future Appointments Date Time Provider [place]  09/27/2016 11:00 AM [facility] [place] 3 [facility]-MFCUS [facility]-US   Follow up Visit:No Follow-up on file.  Postpartum contraception: Depo Provera  Newborn Data:   Katherine, Tout [[id number]]  Live born female  Birth Weight: 4 lb 14.3 oz (2220 g) APGAR: 8, 9   Ying, Blankenhorn [[id number]]  Live born female  Birth Weight: 3 lb 1.7 oz (1410 g) APGAR: 8, 9  Baby Feeding: Bottle and Breast Disposition:NICU   09/25/2016 Lavonia Drafts, MD

## 2016-09-25 NOTE — Progress Notes (Signed)
Patient requested an Ambien prescription for when she goes home. Dr. Ilda Basset declined and advised to instruct patient to take benadryl. Patient advised and in agreement.

## 2016-09-25 NOTE — Lactation Note (Signed)
This note was copied from a baby's chart. Lactation Consultation Note  Patient Name: Robin Horn [id number] Date: 09/25/2016 Reason for consult: Initial assessment;NICU baby;Infant < 6lbs;Late preterm infant;Multiple gestation   Initial consult with mom of 19 hour old twin preterm infants in NICU. Maternal history of Cocaine use, UDS negative with this admission  Mom reports she BF her last child briefly but infant with strong suckle so she stopped. Mom was pumping when LC entered the room and had obtained 7 cc from left breast, none from right breast. Reviewed pumping every 2-3 hours during the day and every 4 hours at night to stimulate milk production. Mom was concerned this was a small amount, reviewed that this was a good amount for a new mom. Reviewed supply and demand, colostrum, milk coming to volume and what to expect with pumping. Providing Milk for Your Baby in NICU Booklet given and reviewed. Reviewed breast milk handling and storage for NICU infant. Breast milk labels and yellow # stickers are at the bedside, reviewed labeling of EBM for NICU infant. We split colostrum into 2 containers for infants.   Mom is a [facility] client and says she spoke with [facility] and is planning to go in tomorrow to pick up a pump. Discussed mom using manual pump vs [facility] loaner and mom wants to use manual pump. Manual pump given with instructions for use and cleaning. Mom was shown how to use pump kit to double pump also. Enc mom to bring pump tubing to NICU to pump when visiting infants in the NICU.   Discussed with mom that illicit drugs that are ingested can enter breast milk and can harm the infants and that if mom decides to use illicit drugs to please do not bring milk to the infant. Mom voiced understanding.  Mom without further questions/concerns. BF resources handout and [facility] brochure given, mom informed of IP/OP Services, BF Support Groups and [place] phone #. Mom is a [facility] client, [facility] pump referral faxed to  [facility] office.   Enc mom to take all breast pump tubing home with her.       Maternal Data Formula Feeding for Exclusion: Yes Reason for exclusion: Mother's choice to formula and breast feed on admission Has patient been taught Hand Expression?: Yes Does the patient have breastfeeding experience prior to this delivery?: Yes  Feeding    LATCH Score/Interventions                      Lactation Tools Discussed/Used WIC Program: Yes Pump Review: Setup, frequency, and cleaning;Milk Storage Initiated by:: Nonah Mattes, RN, IBCLC Date initiated:: 09/25/16   Consult Status Consult Status: PRN    Debby Freiberg Hice 09/25/2016, 10:22 AM

## 2016-09-25 NOTE — Progress Notes (Deleted)
Subjective: Postpartum Day #1 Pt reports feeling sad that baby had to go to the NICU. He was having some traisition issues.  She has no complaints.      Objective: Vital signs in last 24 hours: Temp:  [97.5 F (36.4 C)-99.4 F (37.4 C)] 98.3 F (36.8 C) (05/28 0625) Pulse Rate:  [70-105] 70 (05/28 0625) Resp:  [15-29] 15 (05/28 0625) BP: (118-156)/(75-106) 125/88 (05/28 0625) SpO2:  [97 %-100 %] 99 % (05/28 0625) I/O last 3 completed shifts: In: 400 [P.O.:100; I.V.:300] Out: 902 [Urine:800; Blood:102] No intake/output data recorded.   Physical Exam:  General: alert and no distress Lochia: appropriate Uterine Fundus: firm Incision: dressing clean and dry DVT Evaluation: No evidence of DVT seen on physical exam.  Recent labs:  CBC Latest Ref Rng & Units 09/24/2016 09/24/2016 09/23/2016  WBC 4.0 - 10.5 K/uL 10.0 10.4 13.5(H)  Hemoglobin 12.0 - 15.0 g/dL 11.9(L) 11.4(L) 11.2(L)  Hematocrit 36.0 - 46.0 % 37.2 35.1(L) 34.9(L)  Platelets 150 - 400 K/uL 179 179 196   CMP Latest Ref Rng & Units 09/23/2016 09/20/2016 09/04/2016  Glucose 65 - 99 mg/dL 81 101(H) 88  BUN 6 - 20 mg/dL 8 6 7   Creatinine 0.44 - 1.00 mg/dL 0.58 0.65 0.72  Sodium 135 - 145 mmol/L 136 136 139  Potassium 3.5 - 5.1 mmol/L 3.8 3.9 3.6  Chloride 101 - 111 mmol/L 106 105 105  CO2 22 - 32 mmol/L 22 23 25   Calcium 8.9 - 10.3 mg/dL 8.5(L) 9.1 9.3  Total Protein 6.5 - 8.1 g/dL 5.5(L) 6.6 6.5  Total Bilirubin 0.3 - 1.2 mg/dL 0.3 0.2(L) 0.6  Alkaline Phos 38 - 126 U/L 177(H) 193(H) 156(H)  AST 15 - 41 U/L 20 20 15   ALT 14 - 54 U/L 13(L) 13(L) 14   Assessment/Plan: Status post Cesarean section. Doing well postoperatively.  On Magnesium sulfate for preeclampsia with severe features. Discontinue Magnesium in 24 hours. Advance diet Continue current care Breast pump to bedside.  Lavonia Drafts 09/25/2016, 7:17 AM

## 2016-09-26 ENCOUNTER — Telehealth: Payer: Self-pay | Admitting: *Deleted

## 2016-09-26 ENCOUNTER — Encounter: Payer: Self-pay | Admitting: General Practice

## 2016-09-26 ENCOUNTER — Encounter (HOSPITAL_COMMUNITY): Payer: Self-pay

## 2016-09-26 LAB — GLUCOSE, CAPILLARY
Glucose-Capillary: 106 mg/dL — ABNORMAL HIGH (ref 65–99)
Glucose-Capillary: 120 mg/dL — ABNORMAL HIGH (ref 65–99)
Glucose-Capillary: 148 mg/dL — ABNORMAL HIGH (ref 65–99)
Glucose-Capillary: 94 mg/dL (ref 65–99)

## 2016-09-26 NOTE — Telephone Encounter (Signed)
Pt called stating that she is having pain from her recent delivery and btl. Pt was discharged with Percocet and Ibuprofen for pain. Pt states that she has been taking percocet and ibuprofen at the same time. Advised her to alternate the two medications every two hours. I told her that some pain is to be expected following a delivery and tubal. Patient voiced understanding and had no other questions at this time.

## 2016-09-27 ENCOUNTER — Encounter: Payer: Self-pay | Admitting: Family Medicine

## 2016-09-27 ENCOUNTER — Inpatient Hospital Stay (HOSPITAL_COMMUNITY): Admission: RE | Admit: 2016-09-27 | Payer: Self-pay | Source: Ambulatory Visit

## 2016-09-29 NOTE — Addendum Note (Signed)
Addendum  created 09/29/16 1302 by [name], MD   Sign clinical note

## 2016-10-03 ENCOUNTER — Other Ambulatory Visit: Payer: Self-pay | Admitting: Family Medicine

## 2016-11-07 ENCOUNTER — Ambulatory Visit: Payer: Self-pay | Admitting: Advanced Practice Midwife

## 2016-12-04 ENCOUNTER — Ambulatory Visit: Payer: Self-pay | Admitting: Student

## 2016-12-04 ENCOUNTER — Encounter: Payer: Self-pay | Admitting: General Practice

## 2017-06-13 ENCOUNTER — Encounter (HOSPITAL_COMMUNITY): Payer: Self-pay | Admitting: *Deleted

## 2017-06-13 ENCOUNTER — Inpatient Hospital Stay (HOSPITAL_COMMUNITY)
Admission: AD | Admit: 2017-06-13 | Discharge: 2017-06-13 | Disposition: A | Discharge status: Home / Self Care | Payer: Medicaid Other | Source: Ambulatory Visit | Attending: Obstetrics and Gynecology | Admitting: Obstetrics and Gynecology

## 2017-06-13 ENCOUNTER — Other Ambulatory Visit: Payer: Self-pay

## 2017-06-13 DIAGNOSIS — F17210 Nicotine dependence, cigarettes, uncomplicated: Secondary | ICD-10-CM | POA: Insufficient documentation

## 2017-06-13 DIAGNOSIS — Z3202 Encounter for pregnancy test, result negative: Secondary | ICD-10-CM

## 2017-06-13 DIAGNOSIS — M545 Low back pain, unspecified: Secondary | ICD-10-CM

## 2017-06-13 DIAGNOSIS — O26891 Other specified pregnancy related conditions, first trimester: Secondary | ICD-10-CM

## 2017-06-13 HISTORY — DX: Polycystic kidney, unspecified: Q61.3

## 2017-06-13 LAB — URINALYSIS, ROUTINE W REFLEX MICROSCOPIC
Bilirubin Urine: NEGATIVE
Glucose, UA: NEGATIVE mg/dL
Hgb urine dipstick: NEGATIVE
Ketones, ur: NEGATIVE mg/dL
Leukocytes, UA: NEGATIVE
Nitrite: NEGATIVE
Protein, ur: NEGATIVE mg/dL
Specific Gravity, Urine: 1.016 (ref 1.005–1.030)
pH: 7 (ref 5.0–8.0)

## 2017-06-13 LAB — CBC
HCT: 36.2 % (ref 36.0–46.0)
Hemoglobin: 11.7 g/dL — ABNORMAL LOW (ref 12.0–15.0)
MCH: 28.1 pg (ref 26.0–34.0)
MCHC: 32.3 g/dL (ref 30.0–36.0)
MCV: 87 fL (ref 78.0–100.0)
Platelets: 260 10*3/uL (ref 150–400)
RBC: 4.16 MIL/uL (ref 3.87–5.11)
RDW: 14.1 % (ref 11.5–15.5)
WBC: 8.1 10*3/uL (ref 4.0–10.5)

## 2017-06-13 LAB — WET PREP, GENITAL
Clue Cells Wet Prep HPF POC: NONE SEEN
SPERM: NONE SEEN
TRICH WET PREP: NONE SEEN
YEAST WET PREP: NONE SEEN

## 2017-06-13 LAB — POCT PREGNANCY, URINE: Preg Test, Ur: NEGATIVE

## 2017-06-13 MED ORDER — CYCLOBENZAPRINE HCL 10 MG PO TABS: 10.0000 mg | ORAL_TABLET | Freq: Two times a day (BID) | ORAL | 0 refills | Status: DC | PRN

## 2017-06-13 MED ORDER — KETOROLAC TROMETHAMINE 60 MG/2ML IM SOLN
60.0000 mg | Freq: Once | INTRAMUSCULAR | Status: AC
  Administered 2017-06-13: 60 mg via INTRAMUSCULAR
  Filled 2017-06-13: qty 2

## 2017-06-13 NOTE — MAU Note (Signed)
Back pain and abd pain.  Been going on for about a wk. Hurts when she has to pee, but not when she goes. Pain is to the point now, that she is feeling nauseated.

## 2017-06-13 NOTE — MAU Note (Signed)
Pt presents to MAU c/o back and abdominal pain associated with urination, increased frequency of urination, and decreased output. Pt has h/o polycystic kidney, positive CVA tenderness.   No other medical concerns.

## 2017-06-13 NOTE — MAU Provider Note (Signed)
History  CSN: [id number] Arrival date and time: 06/13/17 1439  First Provider Initiated Contact with Patient 06/13/17 1521      Chief Complaint  Patient presents with  . Abdominal Pain  . Back Pain  . Nausea    HPI: Robin Horn is a 32 y.o. [id number] who presents to maternity admissions reporting 2 weeks of back pain that became more severe last night.  The patient gave birth to twins 9 months ago and has been working long shifts at [employer] since then.  She reports mild pain located mostly on her right side of her back that radiates down her leg.   The pain has woken her up at night and constant.  She reports no pain with urination but does note some pain before having to void.  Patient does not drink a lot of water due to long work hours and issues with stress incontinence post birth of her twins.    She denies any abnormal vaginal discharge, vaginal bleeding, fevers, chills, malaise, dysuria, hematuria, urinary frequency, nausea, vomiting, diarrhea, dizziness/lighreadhess, or headache.    OB History  Gravida Para Term Preterm AB Living  8 5 4 1 3 6   SAB TAB Ectopic Multiple Live Births  0 3 0 1 6    # Outcome Date GA Lbr Len/2nd Weight Sex Delivery Anes PTL Lv  8A Preterm 09/24/16 [redacted]w[redacted]d 06:16 / 00:16 2.22 kg (4 lb 14.3 oz) F Vag-Spont EPI  LIV  8B Preterm 09/24/16 [redacted]w[redacted]d 06:16 / 00:18 1.41 kg (3 lb 1.7 oz) F Vag-Spont EPI  LIV  7 Term 08/05/11 [redacted]w[redacted]d 06:45 / 01:35 3.135 kg (6 lb 14.6 oz) F Vag-Spont EPI  LIV     Birth Comments: birthmark on l leg  6 Term 10/05/06    F Vag-Spont   LIV  5 Term 09/13/04    M Vag-Spont   LIV  4 Term 11/18/02    M Vag-Spont   LIV  3 TAB           2 TAB           1 TAB              Past Medical History:  Diagnosis Date  . Abnormal Pap smear of cervix   . Anxiety   . Chlamydia   . Depression   . Gestational diabetes 2018  . Gonorrhea   . Herpes genitalia   . Migraines   . Ovarian cyst   . Polycystic kidney disease   . Preeclampsia    . Pregnancy induced hypertension    Past Surgical History:  Procedure Laterality Date  . COLPOSCOPY    . DILATION AND CURETTAGE OF UTERUS    . TONSILLECTOMY    . TUBAL LIGATION Bilateral 09/24/2016   Procedure: POST PARTUM TUBAL LIGATION;  Surgeon: Lavonia Drafts, MD;  Location: [place];  Service: Gynecology;  Laterality: Bilateral;   Social History   Socioeconomic History  . Marital status: Single    Spouse name: Not on file  . Number of children: Not on file  . Years of education: Not on file  . Highest education level: Not on file  Social Needs  . Financial resource strain: Not on file  . Food insecurity - worry: Not on file  . Food insecurity - inability: Not on file  . Transportation needs - medical: Not on file  . Transportation needs - non-medical: Not on file  Occupational History  . Not on file  Tobacco Use  . Smoking status: Light Tobacco Smoker    Packs/day: 0.25    Types: Cigarettes  . Smokeless tobacco: Never Used  Substance and Sexual Activity  . Alcohol use: Yes    Alcohol/week: 0.6 oz    Types: 1 Glasses of wine per week    Comment: Once every 2 months. Nothing since finding out about pregnancy.   . Drug use: No    Comment: none since +preg  . Sexual activity: Yes    Birth control/protection: None, Surgical    Comment: Tubal  Other Topics Concern  . Not on file  Social History Narrative  . Not on file   Allergies  Allergen Reactions  . Lactose Intolerance (Gi) Diarrhea    Medications Prior to Admission  Medication Sig Dispense Refill Last Dose  . valACYclovir (VALTREX) 500 MG tablet Take 500 mg by mouth 2 (two) times daily.   06/12/2017 at Unknown time    I have reviewed patient's Past Medical Hx, Surgical Hx, Family Hx, Social Hx, medications and allergies.   Review of Systems: Negative except for what is mentioned in HPI.  Physical Exam   Blood pressure (!) 144/97, pulse 83, temperature 98.4 F (36.9 C), temperature  source Oral, resp. rate 16, weight 76.1 kg (167 lb 12 oz), last menstrual period 05/30/2017, SpO2 100 %, unknown if currently breastfeeding.  Constitutional: Well-developed, well-nourished female in no acute distress.  HENT: [place]/AT.  MMM Eyes: Normal conjunctivae, no scleral icterus Cardiovascular: RRR.  No appreciated murmurs, rubs, or gallops. Respiratory: Lungs CTAB.  Normal WOB. GI: Abd soft, non-tender, non-distended, normoactive bowel sounds GU: Neg CVAT. Although general pain on her right sideof her back when tested.   MSK: Extremities nontender, no edema.  Patient elicits pain on palpation of back.   Neurologic: Alert and oriented x 4. Psych: Normal mood and affect Skin: Warm and dry    MAU Course/MDM:   Nursing notes and VS reviewed. Patient seen and examined, as noted above.  Patient given toradol 60mg  injection.     Results reviewed:  Results for orders placed or performed during the hospital encounter of 06/13/17  Wet prep, genital  Result Value Ref Range   Yeast Wet Prep HPF POC NONE SEEN NONE SEEN   Trich, Wet Prep NONE SEEN NONE SEEN   Clue Cells Wet Prep HPF POC NONE SEEN NONE SEEN   WBC, Wet Prep HPF POC FEW (A) NONE SEEN   Sperm NONE SEEN   Urinalysis, Routine w reflex microscopic  Result Value Ref Range   Color, Urine YELLOW YELLOW   APPearance CLEAR CLEAR   Specific Gravity, Urine 1.016 1.005 - 1.030   pH 7.0 5.0 - 8.0   Glucose, UA NEGATIVE NEGATIVE mg/dL   Hgb urine dipstick NEGATIVE NEGATIVE   Bilirubin Urine NEGATIVE NEGATIVE   Ketones, ur NEGATIVE NEGATIVE mg/dL   Protein, ur NEGATIVE NEGATIVE mg/dL   Nitrite NEGATIVE NEGATIVE   Leukocytes, UA NEGATIVE NEGATIVE  CBC  Result Value Ref Range   WBC 8.1 4.0 - 10.5 K/uL   RBC 4.16 3.87 - 5.11 MIL/uL   Hemoglobin 11.7 (L) 12.0 - 15.0 g/dL   HCT 36.2 36.0 - 46.0 %   MCV 87.0 78.0 - 100.0 fL   MCH 28.1 26.0 - 34.0 pg   MCHC 32.3 30.0 - 36.0 g/dL   RDW 14.1 11.5 - 15.5 %   Platelets 260 150 - 400  K/uL  Pregnancy, urine POC  Result Value Ref Range  Preg Test, Ur NEGATIVE NEGATIVE    Assessment and Plan  Assessment: 1. Right-sided low back pain without sciatica, unspecified chronicity   2. Pregnancy examination or test, negative result     Plan: --recommend flexeril and OCT pain medications such as ibuprofen --Discharge home in stable condition to follow up with PCP if pain continues  Brendolyn Patty, Medical Student 06/13/2017 4:05 PM   I confirm that I have verified the information documented in the medical student's note and that I have also personally reperformed the physical exam and all medical decision making activities.  Patient reported relief from pain after toradol.  VSS, normal WBC on CBC. Low suspicion for any acute processes. Patient discharged home with rx for flexeril Encouraged patient to follow up with PCP.  Wende Mott, CNM 06/13/17 6:34 PM

## 2017-06-13 NOTE — Discharge Instructions (Signed)

## 2017-06-14 LAB — GC/CHLAMYDIA PROBE AMP (~~LOC~~) NOT AT ARMC
Chlamydia: NEGATIVE
Neisseria Gonorrhea: NEGATIVE

## 2017-08-07 ENCOUNTER — Other Ambulatory Visit: Payer: Self-pay

## 2017-08-07 ENCOUNTER — Inpatient Hospital Stay (HOSPITAL_COMMUNITY)
Admission: AD | Admit: 2017-08-07 | Discharge: 2017-08-07 | Discharge status: Against Medical Advice | Payer: Self-pay | Source: Ambulatory Visit | Attending: Obstetrics and Gynecology | Admitting: Obstetrics and Gynecology

## 2017-08-07 ENCOUNTER — Emergency Department (HOSPITAL_COMMUNITY): Admission: EM | Admit: 2017-08-07 | Discharge: 2017-08-07 | Discharge status: Absent without leave | Payer: Self-pay

## 2017-08-07 DIAGNOSIS — R109 Unspecified abdominal pain: Secondary | ICD-10-CM | POA: Insufficient documentation

## 2017-08-07 DIAGNOSIS — Z5321 Procedure and treatment not carried out due to patient leaving prior to being seen by health care provider: Secondary | ICD-10-CM | POA: Insufficient documentation

## 2017-08-07 LAB — URINALYSIS, ROUTINE W REFLEX MICROSCOPIC
BILIRUBIN URINE: NEGATIVE
Glucose, UA: NEGATIVE mg/dL
Hgb urine dipstick: NEGATIVE
Ketones, ur: NEGATIVE mg/dL
LEUKOCYTES UA: NEGATIVE
NITRITE: NEGATIVE
PH: 6 (ref 5.0–8.0)
Protein, ur: NEGATIVE mg/dL
Specific Gravity, Urine: 1.006 (ref 1.005–1.030)

## 2017-08-07 LAB — POCT PREGNANCY, URINE: Preg Test, Ur: NEGATIVE

## 2017-08-07 NOTE — MAU Note (Signed)
Pt told the adm clerk that she was leaving, it is ridiculous that we take OB pt's before her.

## 2017-08-07 NOTE — MAU Note (Signed)
isn't supposed to eat eggs, had some this morning, now her stomach is huge and she is hurting in her upper abd.  Bloated.  Pain is making her throw up.

## 2017-08-22 ENCOUNTER — Emergency Department (HOSPITAL_COMMUNITY): Payer: Self-pay

## 2017-08-22 ENCOUNTER — Other Ambulatory Visit: Payer: Self-pay

## 2017-08-22 ENCOUNTER — Encounter (HOSPITAL_COMMUNITY): Payer: Self-pay

## 2017-08-22 ENCOUNTER — Emergency Department (HOSPITAL_COMMUNITY)
Admission: EM | Admit: 2017-08-22 | Discharge: 2017-08-22 | Disposition: A | Discharge status: Home / Self Care | Payer: Self-pay | Attending: Emergency Medicine | Admitting: Emergency Medicine

## 2017-08-22 DIAGNOSIS — Q613 Polycystic kidney, unspecified: Secondary | ICD-10-CM | POA: Insufficient documentation

## 2017-08-22 DIAGNOSIS — F17210 Nicotine dependence, cigarettes, uncomplicated: Secondary | ICD-10-CM | POA: Insufficient documentation

## 2017-08-22 DIAGNOSIS — R112 Nausea with vomiting, unspecified: Secondary | ICD-10-CM | POA: Insufficient documentation

## 2017-08-22 DIAGNOSIS — R1031 Right lower quadrant pain: Secondary | ICD-10-CM

## 2017-08-22 LAB — COMPREHENSIVE METABOLIC PANEL
ALBUMIN: 3.7 g/dL (ref 3.5–5.0)
ALT: 10 U/L — ABNORMAL LOW (ref 14–54)
ANION GAP: 10 (ref 5–15)
AST: 16 U/L (ref 15–41)
Alkaline Phosphatase: 40 U/L (ref 38–126)
BILIRUBIN TOTAL: 0.7 mg/dL (ref 0.3–1.2)
BUN: 13 mg/dL (ref 6–20)
CHLORIDE: 105 mmol/L (ref 101–111)
CO2: 22 mmol/L (ref 22–32)
Calcium: 8.6 mg/dL — ABNORMAL LOW (ref 8.9–10.3)
Creatinine, Ser: 1.1 mg/dL — ABNORMAL HIGH (ref 0.44–1.00)
GFR calc Af Amer: >60 mL/min (ref >60)
GFR calc non Af Amer: >60 mL/min (ref >60)
GLUCOSE: 121 mg/dL — AB (ref 65–99)
POTASSIUM: 3.5 mmol/L (ref 3.5–5.1)
Sodium: 137 mmol/L (ref 135–145)
Total Protein: 6.5 g/dL (ref 6.5–8.1)

## 2017-08-22 LAB — URINALYSIS, ROUTINE W REFLEX MICROSCOPIC
Bilirubin Urine: NEGATIVE
GLUCOSE, UA: NEGATIVE mg/dL
Hgb urine dipstick: NEGATIVE
Ketones, ur: NEGATIVE mg/dL
LEUKOCYTES UA: NEGATIVE
NITRITE: NEGATIVE
PH: 6 (ref 5.0–8.0)
Protein, ur: NEGATIVE mg/dL
SPECIFIC GRAVITY, URINE: 1.023 (ref 1.005–1.030)

## 2017-08-22 LAB — CBC
HEMATOCRIT: 37.2 % (ref 36.0–46.0)
HEMOGLOBIN: 12 g/dL (ref 12.0–15.0)
MCH: 28.3 pg (ref 26.0–34.0)
MCHC: 32.3 g/dL (ref 30.0–36.0)
MCV: 87.7 fL (ref 78.0–100.0)
Platelets: 273 10*3/uL (ref 150–400)
RBC: 4.24 MIL/uL (ref 3.87–5.11)
RDW: 14.1 % (ref 11.5–15.5)
WBC: 7.3 10*3/uL (ref 4.0–10.5)

## 2017-08-22 LAB — I-STAT CG4 LACTIC ACID, ED
LACTIC ACID, VENOUS: 2.16 mmol/L — AB (ref 0.5–1.9)
LACTIC ACID, VENOUS: 1.28 mmol/L (ref 0.5–1.9)

## 2017-08-22 LAB — I-STAT BETA HCG BLOOD, ED (MC, WL, AP ONLY): I-stat hCG, quantitative: <5 m[IU]/mL (ref <5)

## 2017-08-22 LAB — WET PREP, GENITAL
Clue Cells Wet Prep HPF POC: NONE SEEN
Sperm: NONE SEEN
TRICH WET PREP: NONE SEEN
YEAST WET PREP: NONE SEEN

## 2017-08-22 LAB — LIPASE, BLOOD: LIPASE: 26 U/L (ref 11–51)

## 2017-08-22 MED ORDER — MORPHINE SULFATE (PF) 4 MG/ML IV SOLN
4.0000 mg | Freq: Once | INTRAVENOUS | Status: AC
  Administered 2017-08-22: 4 mg via INTRAVENOUS
  Filled 2017-08-22: qty 1

## 2017-08-22 MED ORDER — ACETAMINOPHEN 325 MG PO TABS
650.0000 mg | ORAL_TABLET | Freq: Once | ORAL | Status: AC
  Administered 2017-08-22: 650 mg via ORAL
  Filled 2017-08-22: qty 2

## 2017-08-22 MED ORDER — IOPAMIDOL (ISOVUE-300) INJECTION 61%
INTRAVENOUS | Status: AC
  Filled 2017-08-22: qty 100

## 2017-08-22 MED ORDER — PANTOPRAZOLE SODIUM 40 MG PO TBEC
40.0000 mg | DELAYED_RELEASE_TABLET | Freq: Once | ORAL | Status: AC
  Administered 2017-08-22: 40 mg via ORAL
  Filled 2017-08-22: qty 1

## 2017-08-22 MED ORDER — IOPAMIDOL (ISOVUE-300) INJECTION 61%
INTRAVENOUS | Status: DC
Start: 2017-08-22 — End: 2017-08-22
  Filled 2017-08-22: qty 100

## 2017-08-22 MED ORDER — ONDANSETRON HCL 4 MG/2ML IJ SOLN
4.0000 mg | Freq: Once | INTRAMUSCULAR | Status: AC
  Administered 2017-08-22: 4 mg via INTRAVENOUS
  Filled 2017-08-22: qty 2

## 2017-08-22 MED ORDER — ONDANSETRON 4 MG PO TBDP: 4.0000 mg | ORAL_TABLET | Freq: Three times a day (TID) | ORAL | 0 refills | Status: DC | PRN

## 2017-08-22 MED ORDER — ONDANSETRON 4 MG PO TBDP
4.0000 mg | ORAL_TABLET | Freq: Once | ORAL | Status: AC | PRN
  Administered 2017-08-22: 4 mg via ORAL
  Filled 2017-08-22: qty 1

## 2017-08-22 MED ORDER — SODIUM CHLORIDE 0.9 % IV BOLUS
1000.0000 mL | Freq: Once | INTRAVENOUS | Status: AC
  Administered 2017-08-22: 1000 mL via INTRAVENOUS

## 2017-08-22 MED ORDER — IOPAMIDOL (ISOVUE-300) INJECTION 61%
100.0000 mL | Freq: Once | INTRAVENOUS | Status: AC | PRN
  Administered 2017-08-22: 100 mL via INTRAVENOUS

## 2017-08-22 NOTE — Discharge Instructions (Addendum)
Lab work, urinalysis, CT scan are mostly reassuring.  CT scan showed some inflammation of your gut.  This is likely from a viral stomach bug.  Treatment for this is usually symptomatic.  Stay well-hydrated.  You may eat as tolerated.  Bland, low-fat foods are usually better tolerated.  Zofran for nausea.  Return for worsening pain, persistent fever, bloody or green vomiting, bloody diarrhea.

## 2017-08-22 NOTE — ED Triage Notes (Signed)
Pt presents for evaluation of abd pain with N/V/D since last night. Pt reports multiple episodes of emesis, did not sleep until 4 am.

## 2017-08-22 NOTE — ED Notes (Signed)
Pt stable, ambulatory, states understanding of discharge instructions 

## 2017-08-22 NOTE — ED Notes (Signed)
Patient transported to CT 

## 2017-08-22 NOTE — ED Notes (Signed)
Pt given gingerale per her request to settle stomach

## 2017-08-22 NOTE — ED Provider Notes (Signed)
[facility] EMERGENCY DEPARTMENT Provider Note   CSN: [id number] Arrival date & time: 08/22/17  5176     History   Chief Complaint Chief Complaint  Patient presents with  . Abdominal Pain    HPI Robin Horn is a 32 y.o. female with h/o BTL here for evaluation of sudden onset nausea and vomiting late last night x 3 times. No vomiting today. States she had bbq chicken earlier that day, and emesis was bright red and looked like koolaid.  Associated symptoms include abdominal pain to RLQ and suprapubic area, intermittent, moderate and low grade fever.  Aggravating factors include palpation. Alleviating factors include zofran given to her in ED. No cough, CP, SOB, dysuria, hematuria, urinary frequency. Her toddler twins have been sick with vomiting and diarrhea at home. No other abdominal surgeries. No h/o PUD. No h/o heavy ETOH or NSAID use.  HPI  Past Medical History:  Diagnosis Date  . Abnormal Pap smear of cervix   . Anxiety   . Chlamydia   . Depression   . Gestational diabetes 2018  . Gonorrhea   . Herpes genitalia   . Migraines   . Ovarian cyst   . Polycystic kidney disease   . Preeclampsia   . Pregnancy induced hypertension     Patient Active Problem List   Diagnosis Date Noted  . Encounter for female sterilization procedure 09/24/2016  . Positive GBS test 09/23/2016  . Supervision of other high risk pregnancy, antepartum 09/20/2016  . Pregnancy affected by fetal growth restriction 09/20/2016  . GDM (gestational diabetes mellitus) 09/11/2016  . Gestational hypertension without significant proteinuria 08/29/2016  . History of postpartum hemorrhage, currently pregnant 05/25/2016  . Drug use affecting pregnancy, antepartum 05/25/2016  . Bipolar 1 disorder ([place]) 05/25/2016  . Insomnia 05/25/2016  . Monochorionic diamniotic twin gestation in third trimester 04/25/2016    Past Surgical History:  Procedure Laterality Date  . COLPOSCOPY    .  DILATION AND CURETTAGE OF UTERUS    . TONSILLECTOMY    . TUBAL LIGATION Bilateral 09/24/2016   Procedure: POST PARTUM TUBAL LIGATION;  Surgeon: Lavonia Drafts, MD;  Location: [facility];  Service: Gynecology;  Laterality: Bilateral;     OB History    Gravida  8   Para  5   Term  4   Preterm  1   AB  3   Living  6     SAB  0   TAB  3   Ectopic  0   Multiple  1   Live Births  6            Home Medications    Prior to Admission medications   Medication Sig Start Date End Date Taking? Authorizing Provider  cyclobenzaprine (FLEXERIL) 10 MG tablet Take 1 tablet (10 mg total) by mouth 2 (two) times daily as needed for muscle spasms. 06/13/17   Wende Mott, CNM  ondansetron (ZOFRAN ODT) 4 MG disintegrating tablet Take 1 tablet (4 mg total) by mouth every 8 (eight) hours as needed for nausea or vomiting. 08/22/17   Kinnie Feil, PA-C    Family History Family History  Problem Relation Age of Onset  . Kidney disease Mother   . Cancer Paternal Grandmother   . Other Neg Hx     Social History Social History   Tobacco Use  . Smoking status: Light Tobacco Smoker    Packs/day: 0.25    Types: Cigarettes  . Smokeless tobacco:  Never Used  Substance Use Topics  . Alcohol use: Yes    Alcohol/week: 0.6 oz    Types: 1 Glasses of wine per week    Comment: Once every 2 months. Nothing since finding out about pregnancy.   . Drug use: No    Types: Cocaine    Comment: none since +preg     Allergies   Lactose intolerance (gi)   Review of Systems Review of Systems  Constitutional: Positive for appetite change and fever.  Gastrointestinal: Positive for abdominal pain, nausea and vomiting.  All other systems reviewed and are negative.    Physical Exam Updated Vital Signs BP 132/87   Pulse 91   Temp (!) 100.7 F (38.2 C) (Oral)   Resp (!) 7   LMP 08/08/2017 (Approximate)   SpO2 100%   Physical Exam  Constitutional: She is oriented  to person, place, and time. She appears well-developed and well-nourished. No distress.  Non toxic. Teary eyed.   HENT:  Head: Normocephalic and atraumatic.  Nose: Nose normal.  Mouth/Throat: No oropharyngeal exudate.  Moist mucous membranes   Eyes: Pupils are equal, round, and reactive to light. Conjunctivae and EOM are normal.  Neck: Normal range of motion.  Cardiovascular: Normal rate, regular rhythm and intact distal pulses.  No murmur heard. 2+ DP and radial pulses bilaterally. No LE edema.   Pulmonary/Chest: Effort normal and breath sounds normal. No respiratory distress. She has no wheezes. She has no rales.  Abdominal: Soft. Bowel sounds are normal. There is tenderness in the epigastric area and suprapubic area. There is CVA tenderness (R).  Positive McBurney's. TTP at suprapubic, R CVA and epigastrium.  No G/R/R.  Genitourinary: Pelvic exam was performed with patient supine.  Genitourinary Comments: External genitalia normal without erythema, edema, tenderness, discharge or lesions.  No groin lymphadenopathy.  Vaginal mucosa and cervix normal, pink with scant amount of physiologic discharge  No CMT. Non palpable adnexa, non tender adnexa.   Musculoskeletal: Normal range of motion. She exhibits no deformity.  Neurological: She is alert and oriented to person, place, and time.  Skin: Skin is warm and dry. Capillary refill takes less than 2 seconds.  Psychiatric: She has a normal mood and affect. Her behavior is normal. Judgment and thought content normal.  Nursing note and vitals reviewed.    ED Treatments / Results  Labs (all labs ordered are listed, but only abnormal results are displayed) Labs Reviewed  WET PREP, GENITAL - Abnormal; Notable for the following components:      Result Value   WBC, Wet Prep HPF POC FEW (*)    All other components within normal limits  COMPREHENSIVE METABOLIC PANEL - Abnormal; Notable for the following components:   Glucose, Bld 121 (*)     Creatinine, Ser 1.10 (*)    Calcium 8.6 (*)    ALT 10 (*)    All other components within normal limits  I-STAT CG4 LACTIC ACID, ED - Abnormal; Notable for the following components:   Lactic Acid, Venous 2.16 (*)    All other components within normal limits  LIPASE, BLOOD  CBC  URINALYSIS, ROUTINE W REFLEX MICROSCOPIC  I-STAT BETA HCG BLOOD, ED ([facility], [facility], AP ONLY)  I-STAT CG4 LACTIC ACID, ED  GC/CHLAMYDIA PROBE AMP ([place]) NOT AT [facility]    EKG None  Radiology Ct Abdomen Pelvis W Contrast  Result Date: 08/22/2017 CLINICAL DATA:  Abdominal pain with nausea, vomiting, and diarrhea since last night. Multiple episodes of emesis. EXAM: CT  ABDOMEN AND PELVIS WITH CONTRAST TECHNIQUE: Multidetector CT imaging of the abdomen and pelvis was performed using the standard protocol following bolus administration of intravenous contrast. CONTRAST:  112mL ISOVUE-300 IOPAMIDOL (ISOVUE-300) INJECTION 61% COMPARISON:  None similar FINDINGS: Lower chest:  No contributory findings. Hepatobiliary: Subcentimeter cyst in the upper central liver.No evidence of biliary obstruction or stone. Pancreas: Unremarkable. Spleen: Unremarkable. Adrenals/Urinary Tract: Negative adrenals. Innumerable renal cysts. This could reflect autosomal dominant polycystic kidney disease. Patient has history of bipolar 1 and lithium toxicity would be an additional consideration. There is no superimposed hemorrhage. No discrete enhancing mass. No hydronephrosis or stone. Negative bladder Stomach/Bowel: Mild prominence of the distal gastric wall thickness but no surrounding fat inflammation or discrete ulceration. A few loops of small bowel have suggested fold thickening, see marked coronal reformats, especially image 27. There is also mild haziness of mesenteric fat involving right-sided small bowel loops, see coronal image 31 negative for obstruction or appendicitis. Vascular/Lymphatic: No acute vascular abnormality. No mass or adenopathy.  Reproductive:Tubal ligation clips.  Ovarian follicles. Other: Trace pelvic fluid which may be physiologic. Musculoskeletal: No acute abnormalities. Mild lumbar facet spurring. IMPRESSION: 1. Findings of mild enteritis. 2. Negative appendix. 3. Polycystic kidneys as described. Electronically Signed   By: Monte Fantasia M.D.   On: 08/22/2017 15:36    Procedures Procedures (including critical care time)  Medications Ordered in ED Medications  iopamidol (ISOVUE-300) 61 % injection (has no administration in time range)  iopamidol (ISOVUE-300) 61 % injection (has no administration in time range)  ondansetron (ZOFRAN-ODT) disintegrating tablet 4 mg (4 mg Oral Given 08/22/17 0800)  morphine 4 MG/ML injection 4 mg (4 mg Intravenous Given 08/22/17 1306)  ondansetron (ZOFRAN) injection 4 mg (4 mg Intravenous Given 08/22/17 1306)  sodium chloride 0.9 % bolus 1,000 mL (0 mLs Intravenous Stopped 08/22/17 1406)  acetaminophen (TYLENOL) tablet 650 mg (650 mg Oral Given 08/22/17 1301)  pantoprazole (PROTONIX) EC tablet 40 mg (40 mg Oral Given 08/22/17 1301)  iopamidol (ISOVUE-300) 61 % injection 100 mL (100 mLs Intravenous Contrast Given 08/22/17 1457)     Initial Impression / Assessment and Plan / ED Course  I have reviewed the triage vital signs and the nursing notes.  Pertinent labs & imaging results that were available during my care of the patient were reviewed by me and considered in my medical decision making (see chart for details).  Clinical Course as of Aug 22 1608  Wed Aug 22, 2017  1553 IMPRESSION: 1. Findings of mild enteritis. 2. Negative appendix. 3. Polycystic kidneys as described.  CT ABDOMEN PELVIS W CONTRAST [CG]    Clinical Course User Index [CG] Kinnie Feil, PA-C   32 yo with nausea, vomiting, abdominal pain and low grade fever. Sick contacts at home. On exam she is febrile, non toxic with TTP at RLQ, suprapubic, R CVA, epigastrium.  Given fever and generalized abd tenderness  ddx includes viral gastroenteritis, appendicitis, cholecystitis, UTI, pyelo.  She has no urinary or vaginal symptoms, however h/o STD, PID also on differential. hcg negative. Fever makes torsion less likely.  Will give morphine, zofran, IVF, perform pelvic exam and reassess. Considering CT vs [place].   1400: pelvic with CMT. Non palpable non tender adnexa. Also continued to have RLQ TTP. Will obtain CT AP.  Final Clinical Impressions(s) / ED Diagnoses   1600: CTAP shows enteritis, known polycystic kidneys. Pt has tolerated PO. No episodes of emesis in ED. Will dc with symptomatic management. Discussed return precautions. Pt and mother  verbalized understanding and agreeable.  Final diagnoses:  Right lower quadrant abdominal pain  Nausea and vomiting in adult  Polycystic kidney disease    ED Discharge Orders        Ordered    ondansetron (ZOFRAN ODT) 4 MG disintegrating tablet  Every 8 hours PRN     08/22/17 1558       Arlean Hopping 08/22/17 1610    Davonna Belling, MD 08/22/17 2139

## 2017-08-22 NOTE — ED Notes (Signed)
Pelvic cart ready.

## 2017-08-23 LAB — GC/CHLAMYDIA PROBE AMP (~~LOC~~) NOT AT ARMC
Chlamydia: NEGATIVE
NEISSERIA GONORRHEA: NEGATIVE

## 2017-09-04 ENCOUNTER — Encounter: Payer: Self-pay | Admitting: *Deleted

## 2017-10-06 ENCOUNTER — Ambulatory Visit (HOSPITAL_COMMUNITY)
Admission: EM | Admit: 2017-10-06 | Discharge: 2017-10-06 | Disposition: A | Discharge status: Home / Self Care | Payer: Self-pay | Attending: Internal Medicine | Admitting: Internal Medicine

## 2017-10-06 ENCOUNTER — Encounter (HOSPITAL_COMMUNITY): Payer: Self-pay

## 2017-10-06 DIAGNOSIS — N946 Dysmenorrhea, unspecified: Secondary | ICD-10-CM

## 2017-10-06 LAB — POCT I-STAT, CHEM 8
BUN: 13 mg/dL (ref 6–20)
CALCIUM ION: 1.23 mmol/L (ref 1.15–1.40)
CREATININE: 1.1 mg/dL — AB (ref 0.44–1.00)
Chloride: 104 mmol/L (ref 101–111)
GLUCOSE: 85 mg/dL (ref 65–99)
HCT: 43 % (ref 36.0–46.0)
HEMOGLOBIN: 14.6 g/dL (ref 12.0–15.0)
POTASSIUM: 4.2 mmol/L (ref 3.5–5.1)
Sodium: 141 mmol/L (ref 135–145)
TCO2: 26 mmol/L (ref 22–32)

## 2017-10-06 MED ORDER — MELOXICAM 15 MG PO TABS: 15.0000 mg | ORAL_TABLET | Freq: Every day | ORAL | 0 refills | Status: DC

## 2017-10-06 MED ORDER — ONDANSETRON 4 MG PO TBDP: 4.0000 mg | ORAL_TABLET | Freq: Three times a day (TID) | ORAL | 0 refills | Status: DC | PRN

## 2017-10-06 NOTE — Discharge Instructions (Addendum)
Your blood work did not show anemia. Start mobic for your menstrual cramp. This medicine will help relieve some of the tightness on the uterus, help with cramping. Zofran as needed for nausea/vomiting. Please follow up with GYN for further evaluation and management needed. If continues with heavy bleeding, worsening dizziness/weakness, passing out, light headedness, go to the emergency department for further evaluation needed.

## 2017-10-06 NOTE — ED Provider Notes (Signed)
[facility]    CSN: [id number] Arrival date & time: 10/06/17  1740     History   Chief Complaint Chief Complaint  Patient presents with  . Vaginal Bleeding    HPI Robin Horn is a 32 y.o. female.   32 year old female comes in for 3-day history of dysmenorrhea, menorrhagia.  States she has always had dysmenorrhea going up.  Started her cycle 3 days ago, and has had heavier cycles, has been needing to switch her pads every hour.  Has some weakness, dizziness, denies syncope.  Nausea without vomiting.  Denies urinary symptoms such as frequency, dysuria, hematuria.  States has felt some chills, and thought she had a fever, and took Tylenol prior to arrival.  States she was managed by Dr. Alyson Ingles in the past for pain, but has run out of her pain meds, and came in for evaluation.     Past Medical History:  Diagnosis Date  . Abnormal Pap smear of cervix   . Anxiety   . Chlamydia   . Depression   . Gestational diabetes 2018  . Gonorrhea   . Herpes genitalia   . Migraines   . Ovarian cyst   . Polycystic kidney disease   . Preeclampsia   . Pregnancy induced hypertension     Patient Active Problem List   Diagnosis Date Noted  . Encounter for female sterilization procedure 09/24/2016  . Positive GBS test 09/23/2016  . Supervision of other high risk pregnancy, antepartum 09/20/2016  . Pregnancy affected by fetal growth restriction 09/20/2016  . GDM (gestational diabetes mellitus) 09/11/2016  . Gestational hypertension without significant proteinuria 08/29/2016  . History of postpartum hemorrhage, currently pregnant 05/25/2016  . Drug use affecting pregnancy, antepartum 05/25/2016  . Bipolar 1 disorder ([place]) 05/25/2016  . Insomnia 05/25/2016  . Monochorionic diamniotic twin gestation in third trimester 04/25/2016    Past Surgical History:  Procedure Laterality Date  . COLPOSCOPY    . DILATION AND CURETTAGE OF UTERUS    . TONSILLECTOMY    . TUBAL LIGATION  Bilateral 09/24/2016   Procedure: POST PARTUM TUBAL LIGATION;  Surgeon: Lavonia Drafts, MD;  Location: [facility];  Service: Gynecology;  Laterality: Bilateral;    OB History    Gravida  8   Para  5   Term  4   Preterm  1   AB  3   Living  6     SAB  0   TAB  3   Ectopic  0   Multiple  1   Live Births  6            Home Medications    Prior to Admission medications   Medication Sig Start Date End Date Taking? Authorizing Provider  meloxicam (MOBIC) 15 MG tablet Take 1 tablet (15 mg total) by mouth daily. 10/06/17   Tasia Catchings, Walfred Bettendorf V, PA-C  ondansetron (ZOFRAN ODT) 4 MG disintegrating tablet Take 1 tablet (4 mg total) by mouth every 8 (eight) hours as needed for nausea or vomiting. 10/06/17   Ok Edwards, PA-C    Family History Family History  Problem Relation Age of Onset  . Kidney disease Mother   . Cancer Paternal Grandmother   . Other Neg Hx     Social History Social History   Tobacco Use  . Smoking status: Light Tobacco Smoker    Packs/day: 0.25    Types: Cigarettes  . Smokeless tobacco: Never Used  Substance Use Topics  . Alcohol  use: Yes    Alcohol/week: 0.6 oz    Types: 1 Glasses of wine per week    Comment: Once every 2 months. Nothing since finding out about pregnancy.   . Drug use: No    Types: Cocaine    Comment: none since +preg     Allergies   Lactose intolerance (gi)   Review of Systems Review of Systems  Reason unable to perform ROS: See HPI as above.     Physical Exam Triage Vital Signs ED Triage Vitals [10/06/17 1758]  Enc Vitals Group     BP (!) 153/100     Pulse Rate 80     Resp 18     Temp 98 F (36.7 C)     Temp src      SpO2 100 %     Weight      Height      Head Circumference      Peak Flow      Pain Score 0     Pain Loc      Pain Edu?      Excl. in [place]?    No data found.  Updated Vital Signs BP (!) 153/100   Pulse 80   Temp 98 F (36.7 C)   Resp 18   LMP 10/06/2017   SpO2 100%    Physical Exam  Constitutional: She is oriented to person, place, and time. She appears well-developed and well-nourished. No distress.  HENT:  Head: Normocephalic and atraumatic.  Eyes: Pupils are equal, round, and reactive to light. Conjunctivae are normal.  Cardiovascular: Normal rate, regular rhythm and normal heart sounds. Exam reveals no gallop and no friction rub.  No murmur heard. Pulmonary/Chest: Effort normal and breath sounds normal. She has no wheezes. She has no rales.  Abdominal: Soft. Bowel sounds are normal. She exhibits no mass. There is no tenderness. There is no rebound, no guarding and no CVA tenderness.  Neurological: She is alert and oriented to person, place, and time.  Skin: Skin is warm and dry.  Psychiatric: She has a normal mood and affect. Her behavior is normal. Judgment normal.     UC Treatments / Results  Labs (all labs ordered are listed, but only abnormal results are displayed) Labs Reviewed  POCT I-STAT, CHEM 8 - Abnormal; Notable for the following components:      Result Value   Creatinine, Ser 1.10 (*)    All other components within normal limits    EKG None  Radiology No results found.  Procedures Procedures (including critical care time)  Medications Ordered in UC Medications - No data to display  Initial Impression / Assessment and Plan / UC Course  I have reviewed the triage vital signs and the nursing notes.  Pertinent labs & imaging results that were available during my care of the patient were reviewed by me and considered in my medical decision making (see chart for details).    I-STAT within normal limits.  Mobic for cramping.  Zofran as needed for nausea.  Push fluids.  Patient to follow-up with GYN for further evaluation and management needed.  Return precautions given.  Final Clinical Impressions(s) / UC Diagnoses   Final diagnoses:  Dysmenorrhea    ED Prescriptions    Medication Sig Dispense Auth. Provider    meloxicam (MOBIC) 15 MG tablet Take 1 tablet (15 mg total) by mouth daily. 15 tablet Talene Glastetter V, PA-C   ondansetron (ZOFRAN ODT) 4 MG disintegrating tablet Take 1  tablet (4 mg total) by mouth every 8 (eight) hours as needed for nausea or vomiting. 10 tablet Tobin Chad, [place] 10/06/17 1928

## 2017-10-06 NOTE — ED Triage Notes (Signed)
Pt presents with complaints of vaginal bleeding that is heavier than normal x 3 days.

## 2018-05-09 ENCOUNTER — Encounter (HOSPITAL_COMMUNITY): Payer: Self-pay | Admitting: Emergency Medicine

## 2018-05-09 ENCOUNTER — Emergency Department (HOSPITAL_COMMUNITY)
Admission: EM | Admit: 2018-05-09 | Discharge: 2018-05-09 | Disposition: A | Discharge status: Home / Self Care | Payer: Self-pay | Attending: Emergency Medicine | Admitting: Emergency Medicine

## 2018-05-09 ENCOUNTER — Other Ambulatory Visit: Payer: Self-pay

## 2018-05-09 ENCOUNTER — Emergency Department (HOSPITAL_COMMUNITY): Payer: Self-pay

## 2018-05-09 DIAGNOSIS — F419 Anxiety disorder, unspecified: Secondary | ICD-10-CM | POA: Insufficient documentation

## 2018-05-09 DIAGNOSIS — R197 Diarrhea, unspecified: Secondary | ICD-10-CM | POA: Insufficient documentation

## 2018-05-09 DIAGNOSIS — R112 Nausea with vomiting, unspecified: Secondary | ICD-10-CM | POA: Insufficient documentation

## 2018-05-09 DIAGNOSIS — F319 Bipolar disorder, unspecified: Secondary | ICD-10-CM | POA: Insufficient documentation

## 2018-05-09 DIAGNOSIS — F17210 Nicotine dependence, cigarettes, uncomplicated: Secondary | ICD-10-CM | POA: Insufficient documentation

## 2018-05-09 LAB — URINALYSIS, ROUTINE W REFLEX MICROSCOPIC
BILIRUBIN URINE: NEGATIVE
Glucose, UA: NEGATIVE mg/dL
HGB URINE DIPSTICK: NEGATIVE
KETONES UR: NEGATIVE mg/dL
Leukocytes, UA: NEGATIVE
Nitrite: NEGATIVE
Protein, ur: NEGATIVE mg/dL
Specific Gravity, Urine: 1.029 (ref 1.005–1.030)
pH: 5 (ref 5.0–8.0)

## 2018-05-09 LAB — CBC
HCT: 40.3 % (ref 36.0–46.0)
Hemoglobin: 12.6 g/dL (ref 12.0–15.0)
MCH: 27.9 pg (ref 26.0–34.0)
MCHC: 31.3 g/dL (ref 30.0–36.0)
MCV: 89.2 fL (ref 80.0–100.0)
Platelets: 269 10*3/uL (ref 150–400)
RBC: 4.52 MIL/uL (ref 3.87–5.11)
RDW: 12.8 % (ref 11.5–15.5)
WBC: 6 10*3/uL (ref 4.0–10.5)
nRBC: 0 % (ref 0.0–0.2)

## 2018-05-09 LAB — COMPREHENSIVE METABOLIC PANEL
ALT: 9 U/L (ref 0–44)
AST: 15 U/L (ref 15–41)
Albumin: 3.6 g/dL (ref 3.5–5.0)
Alkaline Phosphatase: 33 U/L — ABNORMAL LOW (ref 38–126)
Anion gap: 9 (ref 5–15)
BUN: 11 mg/dL (ref 6–20)
CO2: 23 mmol/L (ref 22–32)
CREATININE: 1.03 mg/dL — AB (ref 0.44–1.00)
Calcium: 8.1 mg/dL — ABNORMAL LOW (ref 8.9–10.3)
Chloride: 106 mmol/L (ref 98–111)
GFR calc Af Amer: >60 mL/min (ref >60)
GFR calc non Af Amer: >60 mL/min (ref >60)
Glucose, Bld: 106 mg/dL — ABNORMAL HIGH (ref 70–99)
Potassium: 3.4 mmol/L — ABNORMAL LOW (ref 3.5–5.1)
Sodium: 138 mmol/L (ref 135–145)
Total Bilirubin: 0.4 mg/dL (ref 0.3–1.2)
Total Protein: 6.2 g/dL — ABNORMAL LOW (ref 6.5–8.1)

## 2018-05-09 LAB — LIPASE, BLOOD: Lipase: 27 U/L (ref 11–51)

## 2018-05-09 LAB — I-STAT BETA HCG BLOOD, ED (MC, WL, AP ONLY): I-stat hCG, quantitative: <5 m[IU]/mL (ref <5)

## 2018-05-09 LAB — INFLUENZA PANEL BY PCR (TYPE A & B)
Influenza A By PCR: NEGATIVE
Influenza B By PCR: NEGATIVE

## 2018-05-09 MED ORDER — SODIUM CHLORIDE 0.9 % IV BOLUS
1000.0000 mL | Freq: Once | INTRAVENOUS | Status: AC
  Administered 2018-05-09: 1000 mL via INTRAVENOUS

## 2018-05-09 MED ORDER — ONDANSETRON 4 MG PO TBDP: 4.0000 mg | ORAL_TABLET | Freq: Three times a day (TID) | ORAL | 0 refills | Status: DC | PRN

## 2018-05-09 MED ORDER — KETOROLAC TROMETHAMINE 30 MG/ML IJ SOLN
30.0000 mg | Freq: Once | INTRAMUSCULAR | Status: AC
  Administered 2018-05-09: 30 mg via INTRAVENOUS
  Filled 2018-05-09: qty 1

## 2018-05-09 MED ORDER — ONDANSETRON HCL 4 MG/2ML IJ SOLN
4.0000 mg | Freq: Once | INTRAMUSCULAR | Status: AC
  Administered 2018-05-09: 4 mg via INTRAVENOUS
  Filled 2018-05-09: qty 2

## 2018-05-09 NOTE — ED Provider Notes (Signed)
[facility] DEPT Provider Note   CSN: [id number] Arrival date & time: 05/09/18  1655     History   Chief Complaint Chief Complaint  Patient presents with  . Abdominal Pain    HPI Robin Horn is a 33 y.o. female history of polycystic kidney disease, here presenting with abdominal pain, nausea vomiting, fever.  Patient states that she has been having vomiting and diarrhea for the last week or so.  She has decreased appetite as well.  She started running a fever for the last 2 days.  She states that she had a fever 103 yesterday.  Denies any cough or recent travel.   The history is provided by the patient.    Past Medical History:  Diagnosis Date  . Abnormal Pap smear of cervix   . Anxiety   . Chlamydia   . Depression   . Gestational diabetes 2018  . Gonorrhea   . Herpes genitalia   . Migraines   . Ovarian cyst   . Polycystic kidney disease   . Preeclampsia   . Pregnancy induced hypertension     Patient Active Problem List   Diagnosis Date Noted  . Encounter for female sterilization procedure 09/24/2016  . Positive GBS test 09/23/2016  . Supervision of other high risk pregnancy, antepartum 09/20/2016  . Pregnancy affected by fetal growth restriction 09/20/2016  . GDM (gestational diabetes mellitus) 09/11/2016  . Gestational hypertension without significant proteinuria 08/29/2016  . History of postpartum hemorrhage, currently pregnant 05/25/2016  . Drug use affecting pregnancy, antepartum 05/25/2016  . Bipolar 1 disorder ([place]) 05/25/2016  . Insomnia 05/25/2016  . Monochorionic diamniotic twin gestation in third trimester 04/25/2016    Past Surgical History:  Procedure Laterality Date  . COLPOSCOPY    . DILATION AND CURETTAGE OF UTERUS    . TONSILLECTOMY    . TUBAL LIGATION Bilateral 09/24/2016   Procedure: POST PARTUM TUBAL LIGATION;  Surgeon: Lavonia Drafts, MD;  Location: [facility];  Service: Gynecology;   Laterality: Bilateral;     OB History    Gravida  8   Para  5   Term  4   Preterm  1   AB  3   Living  6     SAB  0   TAB  3   Ectopic  0   Multiple  1   Live Births  6            Home Medications    Prior to Admission medications   Medication Sig Start Date End Date Taking? Authorizing Provider  ondansetron (ZOFRAN-ODT) 4 MG disintegrating tablet Take 4 mg by mouth once.   Yes [provider]  oxyCODONE-acetaminophen (PERCOCET) 10-325 MG tablet Take 1 tablet by mouth once.   Yes [provider]  meloxicam (MOBIC) 15 MG tablet Take 1 tablet (15 mg total) by mouth daily. Patient not taking: Reported on 05/09/2018 10/06/17   Ok Edwards, PA-C  ondansetron (ZOFRAN ODT) 4 MG disintegrating tablet Take 1 tablet (4 mg total) by mouth every 8 (eight) hours as needed for nausea or vomiting. Patient not taking: Reported on 05/09/2018 10/06/17   Arturo Morton    Family History Family History  Problem Relation Age of Onset  . Kidney disease Mother   . Cancer Paternal Grandmother   . Other Neg Hx     Social History Social History   Tobacco Use  . Smoking status: Light Tobacco Smoker  Packs/day: 0.25    Types: Cigarettes  . Smokeless tobacco: Never Used  Substance Use Topics  . Alcohol use: Yes    Alcohol/week: 1.0 standard drinks    Types: 1 Glasses of wine per week    Comment: Once every 2 months. Nothing since finding out about pregnancy.   . Drug use: No    Types: Cocaine    Comment: none since +preg     Allergies   Lactose intolerance (gi)   Review of Systems Review of Systems  Constitutional: Positive for fever.  Gastrointestinal: Positive for diarrhea and vomiting.  All other systems reviewed and are negative.    Physical Exam Updated Vital Signs BP 131/88 (BP Location: Left Arm)   Pulse (!) 106   Temp 99.1 F (37.3 C) (Oral)   Resp 18   LMP 04/25/2018   SpO2 100%   Physical Exam Vitals signs and nursing note  reviewed.  Constitutional:      Comments: Tired, uncomfortable   HENT:     Head: Normocephalic.     Mouth/Throat:     Comments: MM slightly dry  Eyes:     Extraocular Movements: Extraocular movements intact.  Cardiovascular:     Rate and Rhythm: Normal rate and regular rhythm.  Pulmonary:     Effort: Pulmonary effort is normal.     Breath sounds: Normal breath sounds.  Abdominal:     General: Abdomen is flat. Bowel sounds are normal.     Palpations: Abdomen is soft.     Tenderness: There is no abdominal tenderness.  Skin:    General: Skin is warm.     Capillary Refill: Capillary refill takes less than 2 seconds.  Neurological:     General: No focal deficit present.     Mental Status: She is alert and oriented to person, place, and time.  Psychiatric:        Mood and Affect: Mood normal.        Behavior: Behavior normal.      ED Treatments / Results  Labs (all labs ordered are listed, but only abnormal results are displayed) Labs Reviewed  COMPREHENSIVE METABOLIC PANEL - Abnormal; Notable for the following components:      Result Value   Potassium 3.4 (*)    Glucose, Bld 106 (*)    Creatinine, Ser 1.03 (*)    Calcium 8.1 (*)    Total Protein 6.2 (*)    Alkaline Phosphatase 33 (*)    All other components within normal limits  LIPASE, BLOOD  CBC  URINALYSIS, ROUTINE W REFLEX MICROSCOPIC  INFLUENZA PANEL BY PCR (TYPE A & B)  I-STAT BETA HCG BLOOD, ED ([facility], [facility], [facility] ONLY)    EKG None  Radiology Dg Acute Abd 2+v Abd (supine,erect,decub) + 1v Chest  Result Date: 05/09/2018 CLINICAL DATA:  Vomiting fever and cough EXAM: DG ABDOMEN ACUTE W/ 1V CHEST COMPARISON:  CT 08/22/2017 FINDINGS: There is no evidence of dilated bowel loops or free intraperitoneal air. No radiopaque calculi or other significant radiographic abnormality is seen. Heart size and mediastinal contours are within normal limits. Both lungs are clear. Tubal ligation clips in the pelvis. IMPRESSION: Negative  abdominal radiographs.  No acute cardiopulmonary disease. Electronically Signed   By: Donavan Foil M.D.   On: 05/09/2018 20:49    Procedures Procedures (including critical care time)  Medications Ordered in ED Medications  sodium chloride 0.9 % bolus 1,000 mL (1,000 mLs Intravenous New Bag/Given 05/09/18 2100)  ondansetron (ZOFRAN) injection 4  mg (4 mg Intravenous Given 05/09/18 2102)  ketorolac (TORADOL) 30 MG/ML injection 30 mg (30 mg Intravenous Given 05/09/18 2102)     Initial Impression / Assessment and Plan / ED Course  I have reviewed the triage vital signs and the nursing notes.  Pertinent labs & imaging results that were available during my care of the patient were reviewed by me and considered in my medical decision making (see chart for details).    WHITLEE SLUDER is a 33 y.o. female here with vomiting, diarrhea, fever. Consider viral gastro vs pneumonia vs pyelo vs flu syndrome. Will get labs, lipase, UA, CXR, flu. Will hydrate and reassess.   10:16 PM xrays unremarkable. Flu negative. WBC nl. Patient tolerated PO in the ED. Will dc home with zofran, imodium prn. Likely viral gastro.   Final Clinical Impressions(s) / ED Diagnoses   Final diagnoses:  None    ED Discharge Orders    None       Drenda Freeze, MD 05/09/18 2218

## 2018-05-09 NOTE — ED Triage Notes (Signed)
Patient c/o generalized abdominal pain with N/V/D and body aches x1 week. Denies sore throat, cough, and congestion.

## 2018-05-09 NOTE — Discharge Instructions (Signed)
Stay hydrated.   Take zofran for nausea.   Take imodium for diarrhea.   Follow up with your doctor.   Return to ER if you have worse vomiting, diarrhea, fever for a week, dehydration

## 2018-05-26 IMAGING — US US MFM OB LIMITED
1 series · 15 of 24 positions shown · non-contrast
Comparison: none

[Series 1: us mfm ob limited · 24 acquisitions, 15 frames shown]
[im 1/24]
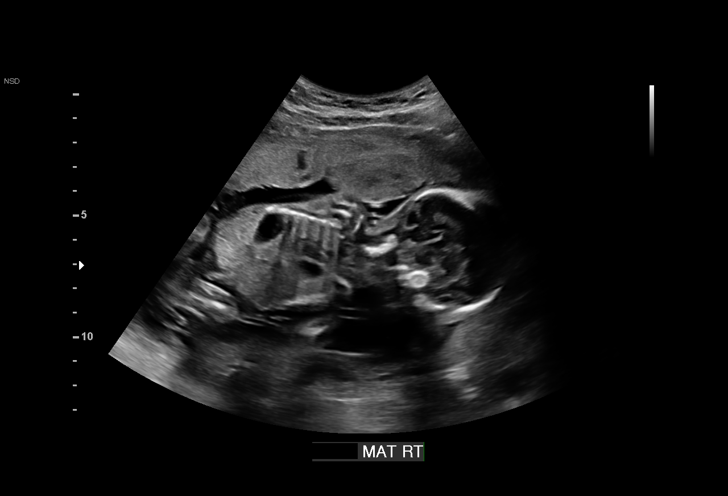
[im 3/24]
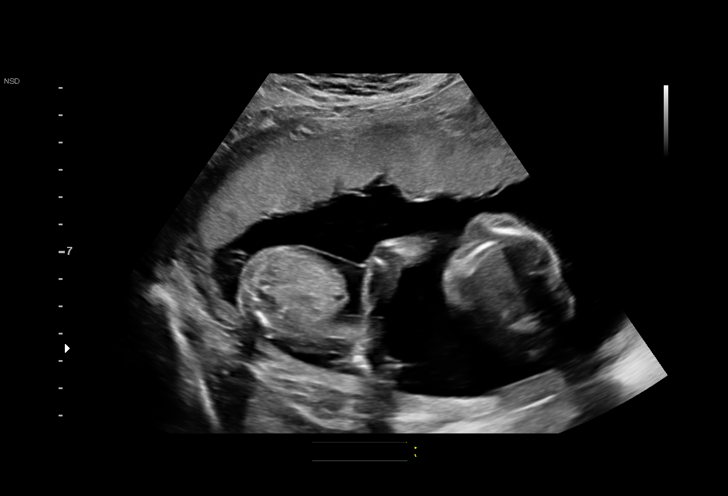
[im 5/24]
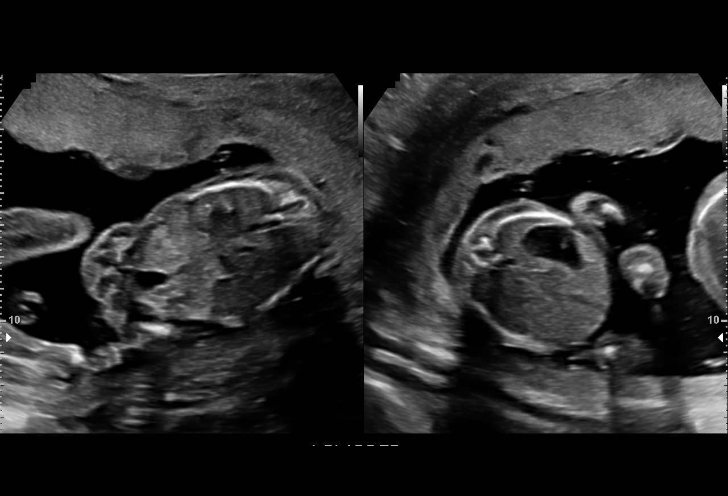
[im 6/24]
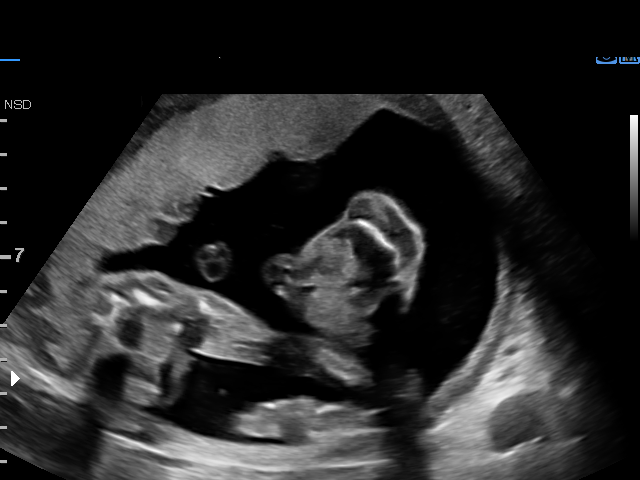
[im 8/24]
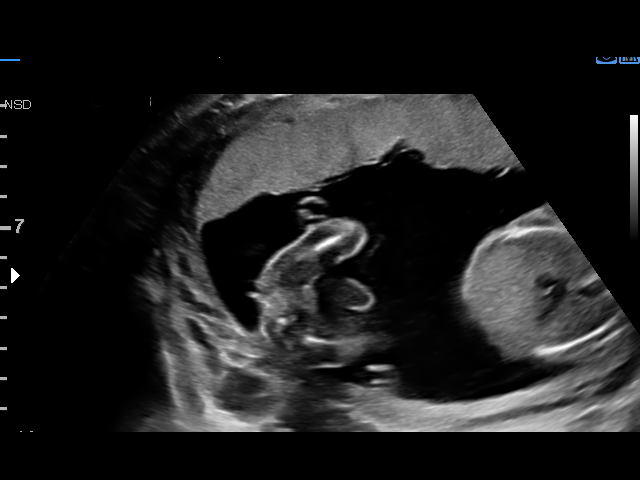
[im 9/24]
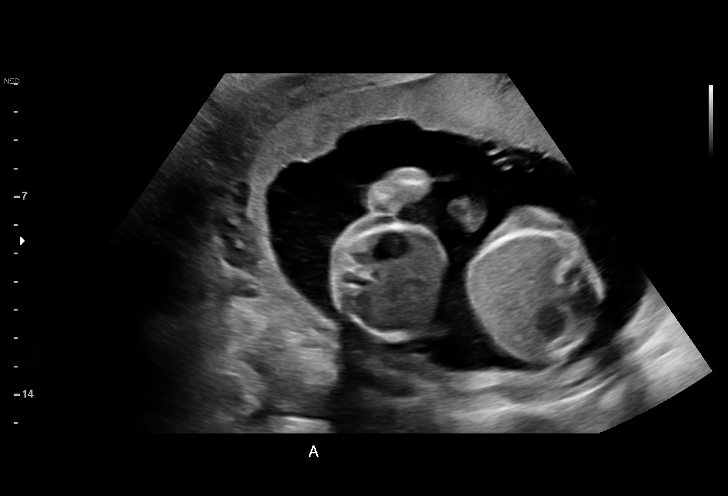
[im 11/24]
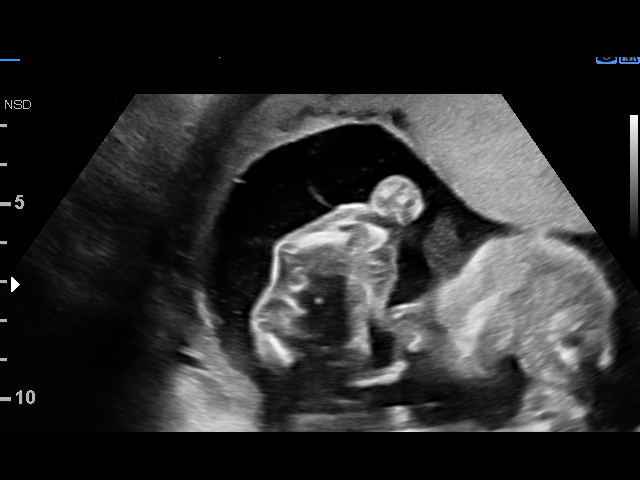
[im 13/24]
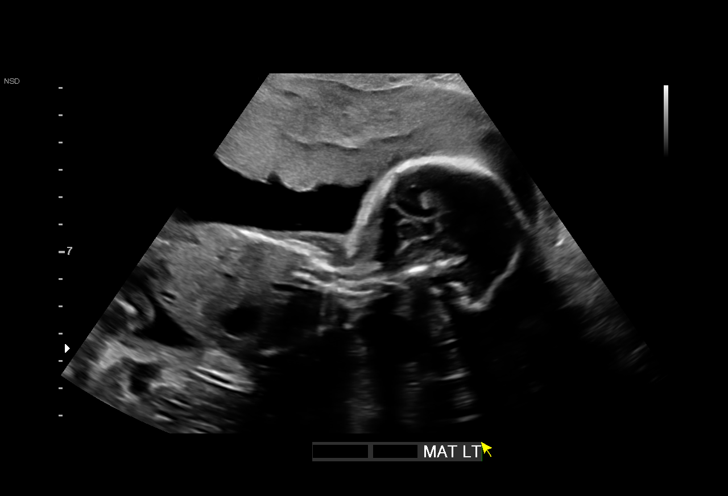
[im 14/24]
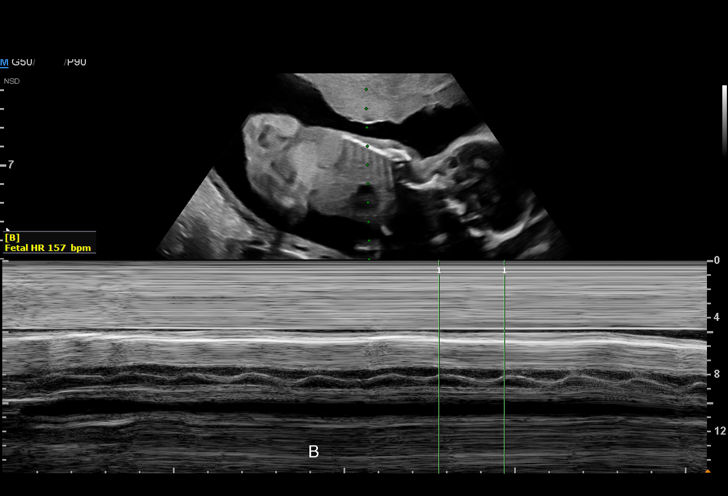
[im 16/24]
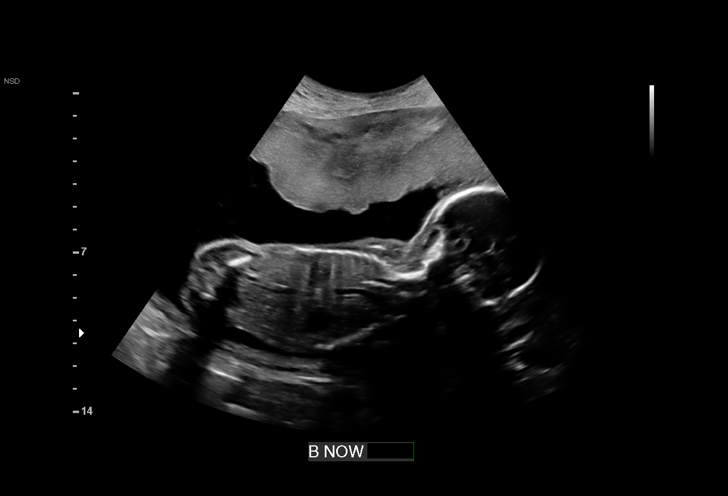
[im 17/24]
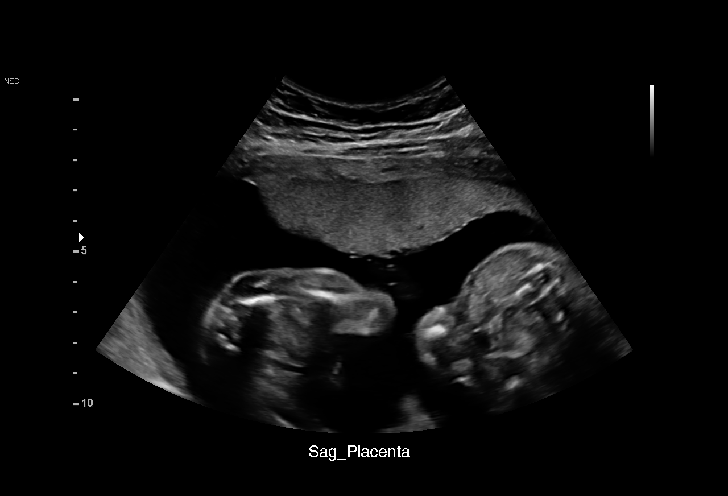
[im 19/24]
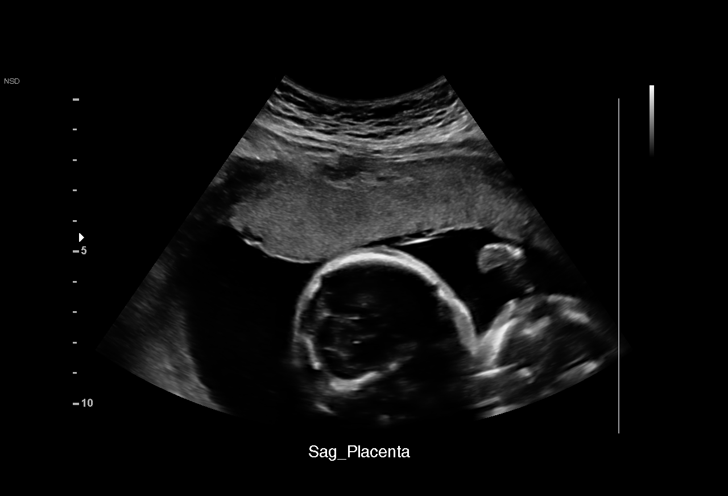
[im 21/24]
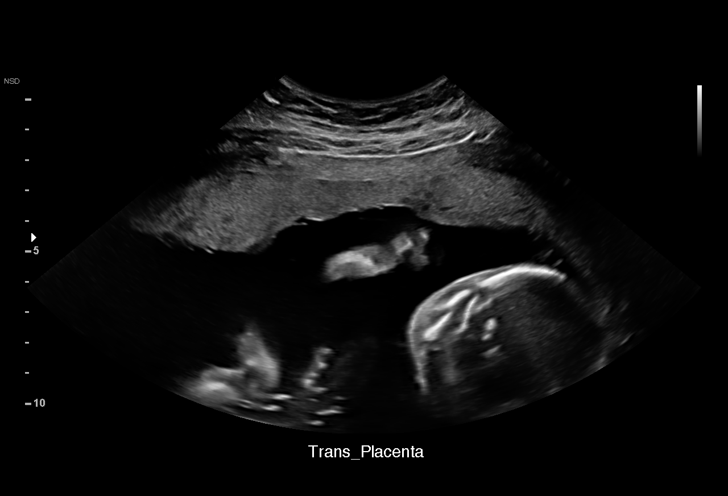
[im 22/24]
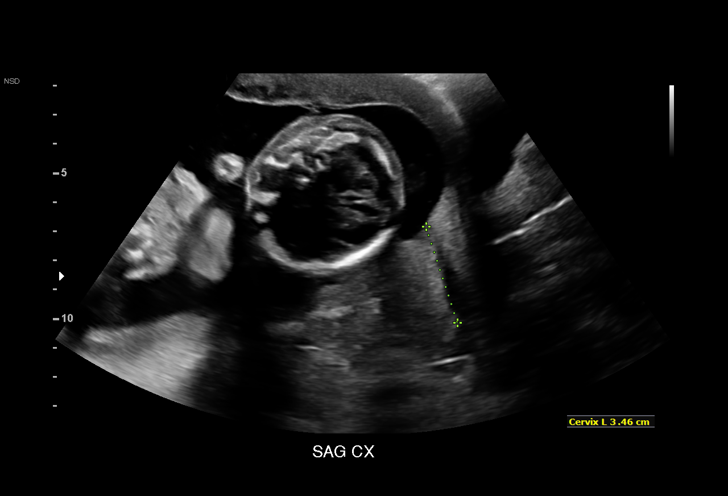
[im 24/24]
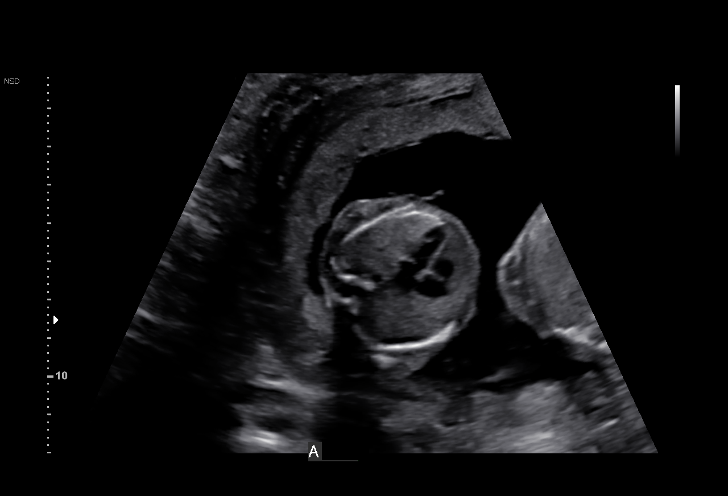

[15 of 24 positions shown; findings below may reference images not displayed]

1  INDER BORRELLI            [id number]      [id number]     [id number]
Indications

20 weeks gestation of pregnancy
Pre-existing essential hypertension
complicating pregnancy, second trimester
Poor obstetric history: Previous
preeclampsia / eclampsia/gestational HTN
Drug use complicating pregnancy, second
trimester (cocaine)
Tobacco use complicating pregnancy,
second trimester
OB History

Blood Type:            Height:  5'3"   Weight (lb):  171      BMI:
Gravidity:    8         Term:   4        Prem:   0        SAB:   0
TOP:          3       Ectopic:  0        Living: 4
Fetal Evaluation (Fetus A)

Num Of Fetuses:     2
Fetal Heart         161
Rate(bpm):
Cardiac Activity:   Observed
Fetal Lie:          Maternal right side
Presentation:       Cephalic
Placenta:           Anterior, above cervical os
P. Cord Insertion:  Previously Visualized
Membrane Desc:      Dividing Membrane seen - Monochorionic

Amniotic Fluid
AFI FV:      Subjectively low-normal

Largest Pocket(cm)
4.3
Gestational Age (Fetus A)

LMP:           22w 5d       Date:   01/14/16                 EDD:   10/20/16
Best:          20w 3d    Det. By:   Early Ultrasound         EDD:   11/05/16
(04/03/16)

Fetal Evaluation (Fetus B)

Num Of Fetuses:     2
Fetal Heart         157
Rate(bpm):
Cardiac Activity:   Observed
Fetal Lie:          Maternal left side
Presentation:       Variable
Placenta:           Anterior, above cervical os
P. Cord Insertion:  Previously Visualized
Membrane Desc:      Dividing Membrane seen - Monochorionic

Amniotic Fluid
AFI FV:      Subjectively within normal limits

Largest Pocket(cm)
6.7
Gestational Age (Fetus B)

LMP:           22w 5d       Date:   01/14/16                 EDD:   10/20/16
Best:          20w 3d    Det. By:   Early Ultrasound         EDD:   11/05/16
(04/03/16)
Cervix Uterus Adnexa

Cervix
Length:            3.5  cm.
Normal appearance by transabdominal scan.
Impression

Monochorionic/diamniotic twin pregnancy at 20+3 weeks
Mild to moderate discrepancy in fluid volume
Bladders visualized x 2
Twin A: MVP - 4.3 cms
Twin B: MVP - 6.7 cms

No signficant change in AFVs since last week.
Recommendations

Continue to check AFVs and bladder each week
Growth Njenji in 2 weeks

## 2018-08-11 IMAGING — US US MFM FETAL BPP W/O NON-STRESS
1 series · 12 of 28 positions shown · non-contrast
Comparison: none

[Series 1: us mfm fetal bpp w/o non-stress · 53 acquisitions, 12 frames shown]
[im 2/53]
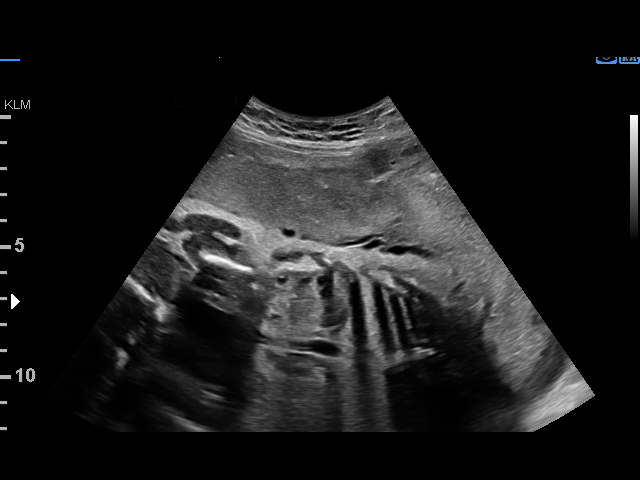
[im 6/53]
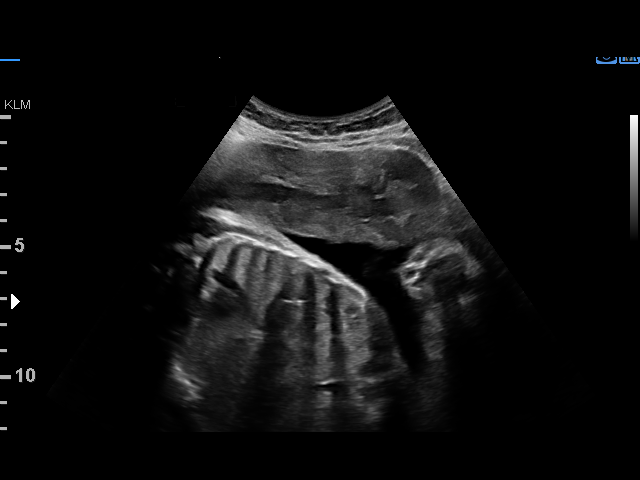
[im 10/53]
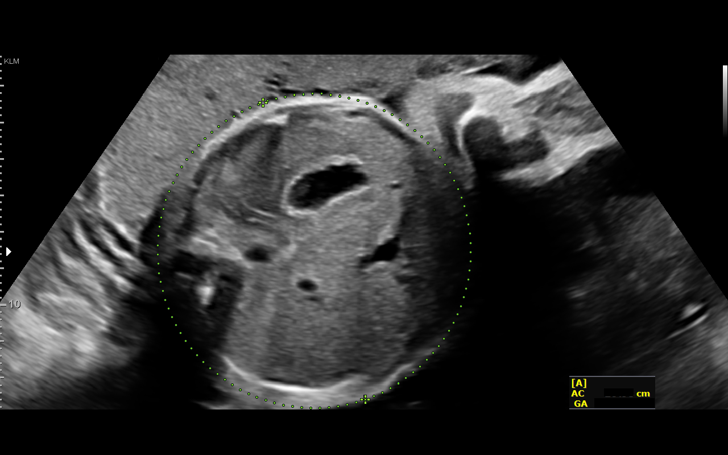
[im 16/53]
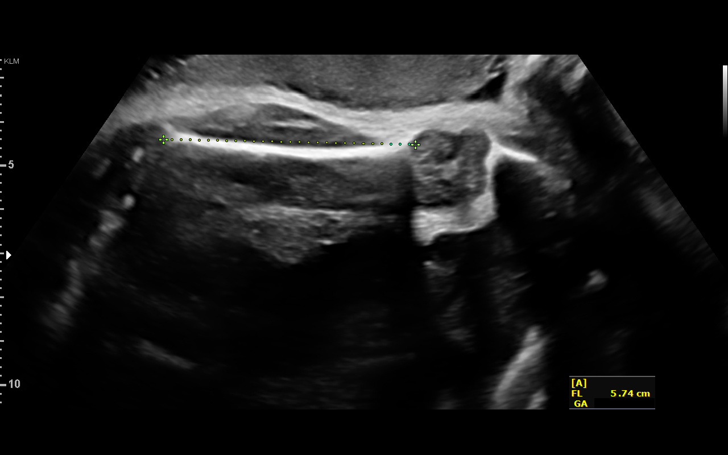
[im 20/53]
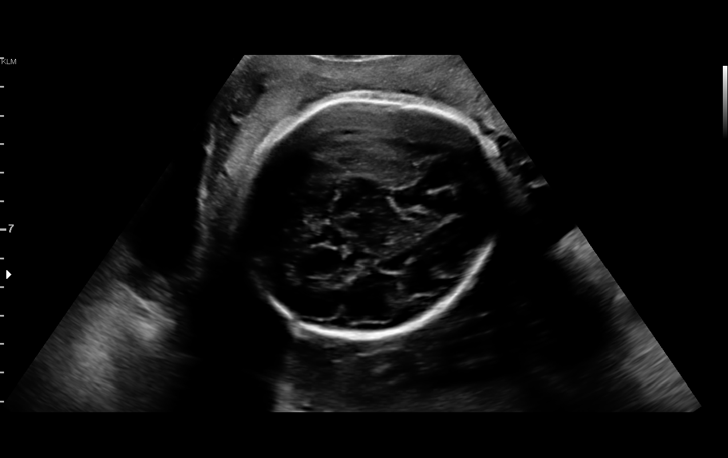
[im 24/53]
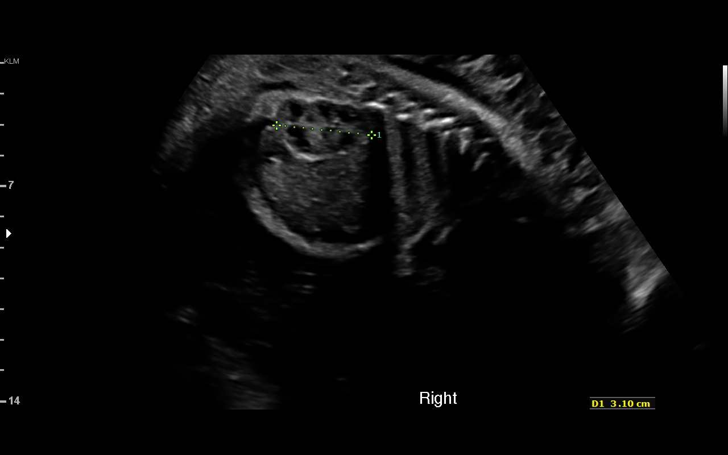
[im 29/53]
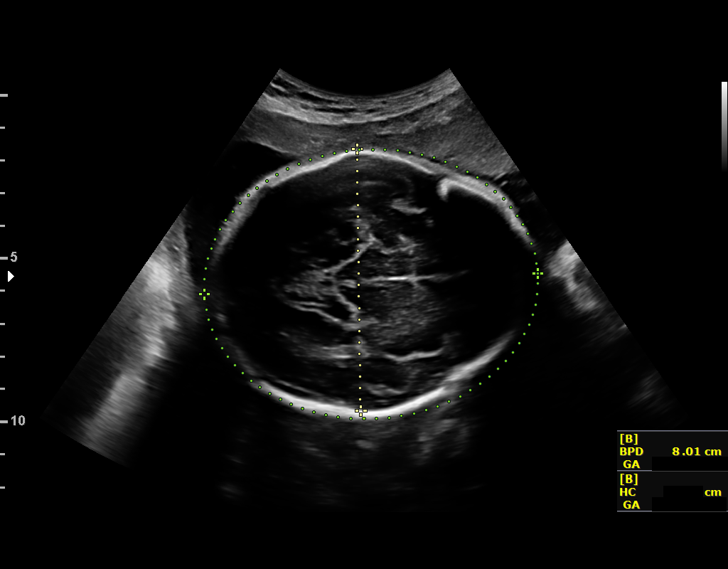
[im 33/53]
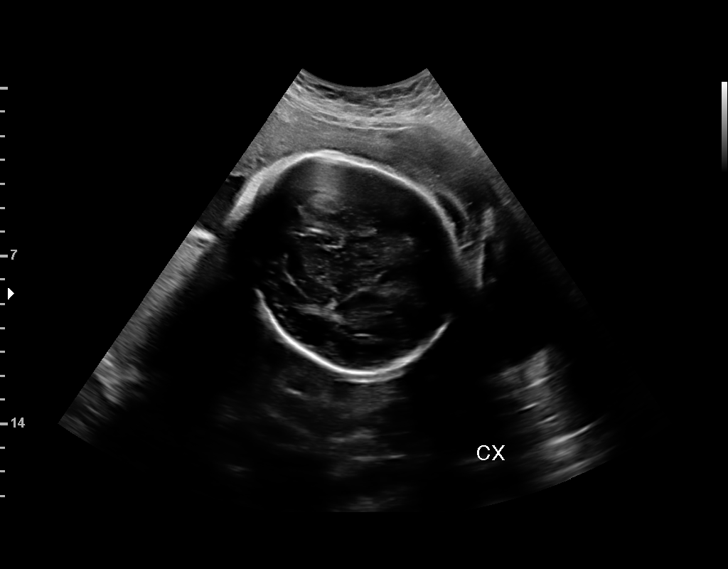
[im 37/53]
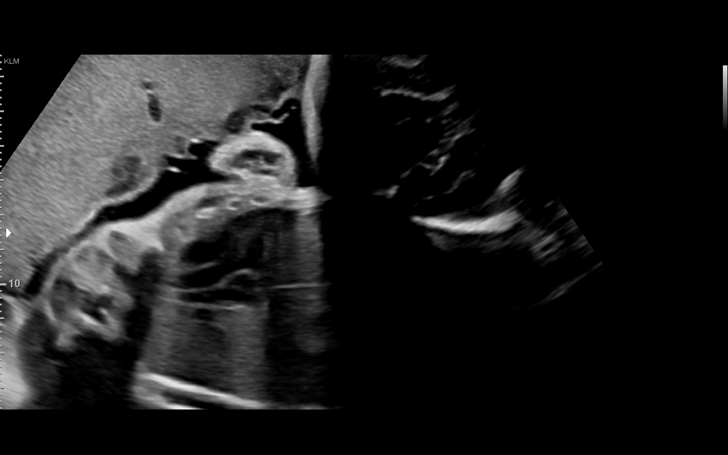
[im 43/53]
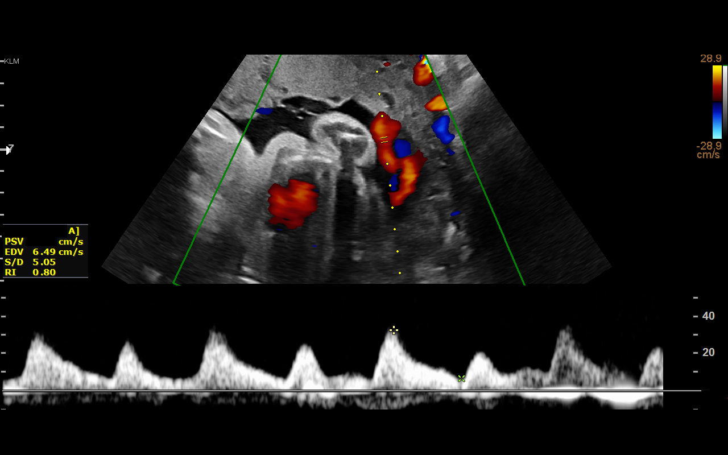
[im 47/53]
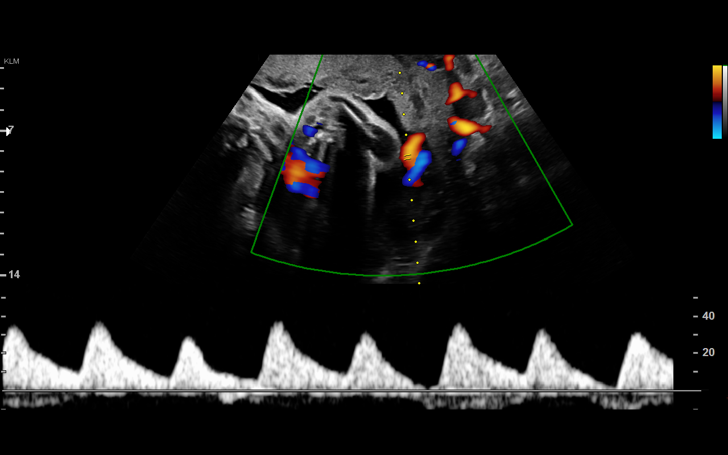
[im 51/53]
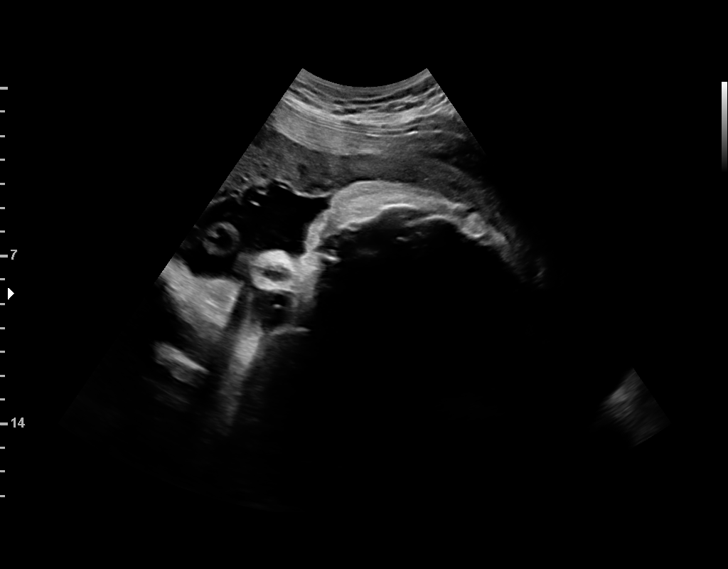

[12 of 28 positions shown; findings below may reference images not displayed]

EVAL
GESTATION

1  KAETHY DIUS            [id number]      [id number]     [id number]
2  KAETHY DIUS            [id number]      [id number]     [id number]
3  KAETHY DIUS            [id number]      [id number]     [id number]
4  KAETHY DIUS            [id number]      [id number]     [id number]
5  KAETHY DIUS            [id number]      [id number]     [id number]
6  KAETHY DIUS            [id number]      [id number]     [id number]
Indications

31 weeks gestation of pregnancy
Twin pregnancy, [place], third trimester
Maternal care for known or suspected poor
fetal growth, third trimester, fetus 2
OB History

Blood Type:            Height:  5'3"   Weight (lb):  171      BMI:
Gravidity:    8         Term:   4        Prem:   0        SAB:   0
TOP:          3       Ectopic:  0        Living: 4
Fetal Evaluation (Fetus A)

Num Of Fetuses:     2
Cardiac Activity:   Observed
Fetal Lie:          Right Fetus
Presentation:       Cephalic
Placenta:           Anterior, above cervical os
Amniotic Fluid
AFI FV:      Subjectively within normal limits

Largest Pocket(cm)
4.7
Biophysical Evaluation (Fetus A)

Amniotic F.V:   Within normal limits       F. Tone:        Observed
F. Movement:    Observed                   Score:          [DATE]
F. Breathing:   Observed
Biometry (Fetus A)

BPD:      81.6  mm     G. Age:  32w 6d         80  %    CI:        78.65   %   70 - 86
FL/HC:      19.7   %   19.3 -
HC:       291   mm     G. Age:  32w 1d         30  %    HC/AC:      1.09       0.96 -
AC:      268.1  mm     G. Age:  30w 6d         32  %    FL/BPD:     70.3   %   71 - 87
FL:       57.4  mm     G. Age:  30w 0d          9  %    FL/AC:      21.4   %   20 - 24

Est. FW:    5336  gm    3 lb 11 oz      45  %     FW Discordancy     0 \ 13 %
Gestational Age (Fetus A)

LMP:           33w 5d       Date:   01/14/16                 EDD:   10/20/16
U/S Today:     31w 3d                                        EDD:   11/05/16
Best:          31w 3d    Det. By:   Early Ultrasound         EDD:   11/05/16
(04/03/16)
Anatomy (Fetus A)

Cranium:               Appears normal         Aortic Arch:            Previously seen
Cavum:                 Previously seen        Ductal Arch:            Previously seen
Ventricles:            Appears normal         Diaphragm:              Appears normal
Choroid Plexus:        Previously seen        Stomach:                Appears normal, left
sided
Cerebellum:            Previously seen        Abdomen:                Previously seen
Posterior Fossa:       Previously seen        Abdominal Wall:         Previously seen
Nuchal Fold:           Previously seen        Cord Vessels:           Previously seen
Face:                  Orbits and profile     Kidneys:                Appear normal
previously seen
Lips:                  Previously seen        Bladder:                Appears normal
Thoracic:              Previously seen        Spine:                  Previously seen
Heart:                 Appears normal         Upper Extremities:      Previously seen
(4CH, axis, and situs
RVOT:                  Previously seen        Lower Extremities:      Previously seen
LVOT:                  Previously seen

Other:  Heels previously visualized.
Doppler - Fetal Vessels (Fetus A)

Umbilical Artery
S/D     %tile                                            ADFV    RDFV
2.97       61                                                No      No

Fetal Evaluation (Fetus B)
Num Of Fetuses:     2
Fetal Heart         151
Rate(bpm):
Cardiac Activity:   Observed
Fetal Lie:          Left Fetus
Presentation:       Breech
Placenta:           Anterior, above cervical os

Amniotic Fluid
AFI FV:      Subjectively within normal limits

Largest Pocket(cm)
5
Biophysical Evaluation (Fetus B)

Amniotic F.V:   Within normal limits       F. Tone:        Observed
F. Movement:    Observed                   Score:          [DATE]
F. Breathing:   Observed
Biometry (Fetus B)

BPD:      80.3  mm     G. Age:  32w 2d         65  %    CI:        80.39   %   70 - 86
FL/HC:      18.1   %   19.3 -
HC:      282.9  mm     G. Age:  31w 0d          9  %    HC/AC:      1.07       0.96 -
AC:      264.3  mm     G. Age:  30w 4d         23  %    FL/BPD:     63.9   %   71 - 87
FL:       51.3  mm     G. Age:  27w 3d        < 3  %    FL/AC:      19.4   %   20 - 24

Est. FW:    4119  gm      3 lb 3 oz     23  %     FW Discordancy        13  %
Gestational Age (Fetus B)

LMP:           33w 5d       Date:   01/14/16                 EDD:   10/20/16
U/S Today:     30w 2d                                        EDD:   11/13/16
Best:          31w 3d    Det. By:   Early Ultrasound         EDD:   11/05/16
(04/03/16)
Anatomy (Fetus B)

Cranium:               Appears normal         Aortic Arch:            Previously seen
Cavum:                 Previously seen        Ductal Arch:            Previously seen
Ventricles:            Previously seen        Diaphragm:              Appears normal
Choroid Plexus:        Previously seen        Stomach:                Appears normal, left
sided
Cerebellum:            Appears normal         Abdomen:                Previously seen
Posterior Fossa:       Previously seen        Abdominal Wall:         Previously seen
Nuchal Fold:           Previously seen        Cord Vessels:           Previously seen
Face:                  Orbits and profile     Kidneys:                Previously seen
previously seen
Lips:                  Previously seen        Bladder:                Previously seen
Thoracic:              Previously seen        Spine:                  Previously seen
Heart:                 Appears normal         Upper Extremities:      Previously seen
(4CH, axis, and situs
RVOT:                  Previously seen        Lower Extremities:      Previously seen
LVOT:                  Appears normal
Doppler - Fetal Vessels (Fetus B)

Umbilical Artery
S/D     %tile                                            ADFV    RDFV
4.3    > 97.5                                              Yes      No
Cervix Uterus Adnexa

Cervix
Not visualized (advanced GA >22wks)
Impression

MC/DA twin gestation currently at 31w 3d
Follow up due to suspected fetal growth restriction of Twin B

Twin A:
Maternal right, cephalic presentation
BPP [DATE]
UA Doppler studies normal for gestational age
Normal amniotic fluid volume

Twin B:
Breech
BPP [DATE]
UA Doppler studies: elevated for gestational age.  Some
episodes of AEDF appreciated
Normal amniotic fluid volume
Recommendations

Patient is scheduled for follow up twice weekly
s/p course of Boillat
Twice weekly BPPs, UA Doppler studies for now. Would
consider inpatient observation if UA Doppler studies in Twin
B worsen; delivery at 34 weeks if UA Doppler indicies
continue to be elevated in Twin B.

## 2018-09-18 ENCOUNTER — Other Ambulatory Visit: Payer: Self-pay

## 2018-09-18 ENCOUNTER — Emergency Department (HOSPITAL_COMMUNITY): Payer: HRSA Program

## 2018-09-18 ENCOUNTER — Emergency Department (HOSPITAL_COMMUNITY)
Admission: EM | Admit: 2018-09-18 | Discharge: 2018-09-19 | Disposition: A | Discharge status: Home / Self Care | Payer: HRSA Program | Attending: Emergency Medicine | Admitting: Emergency Medicine

## 2018-09-18 DIAGNOSIS — R0789 Other chest pain: Secondary | ICD-10-CM | POA: Diagnosis not present

## 2018-09-18 DIAGNOSIS — R110 Nausea: Secondary | ICD-10-CM | POA: Insufficient documentation

## 2018-09-18 DIAGNOSIS — J111 Influenza due to unidentified influenza virus with other respiratory manifestations: Secondary | ICD-10-CM | POA: Diagnosis not present

## 2018-09-18 DIAGNOSIS — Q612 Polycystic kidney, adult type: Secondary | ICD-10-CM | POA: Diagnosis not present

## 2018-09-18 DIAGNOSIS — F17210 Nicotine dependence, cigarettes, uncomplicated: Secondary | ICD-10-CM | POA: Diagnosis not present

## 2018-09-18 DIAGNOSIS — R509 Fever, unspecified: Secondary | ICD-10-CM | POA: Diagnosis not present

## 2018-09-18 DIAGNOSIS — R519 Headache, unspecified: Secondary | ICD-10-CM

## 2018-09-18 DIAGNOSIS — Z20828 Contact with and (suspected) exposure to other viral communicable diseases: Secondary | ICD-10-CM | POA: Diagnosis not present

## 2018-09-18 DIAGNOSIS — J029 Acute pharyngitis, unspecified: Secondary | ICD-10-CM | POA: Insufficient documentation

## 2018-09-18 DIAGNOSIS — R51 Headache: Secondary | ICD-10-CM | POA: Diagnosis present

## 2018-09-18 LAB — CBC
HCT: 38.5 % (ref 36.0–46.0)
Hemoglobin: 12.4 g/dL (ref 12.0–15.0)
MCH: 28.6 pg (ref 26.0–34.0)
MCHC: 32.2 g/dL (ref 30.0–36.0)
MCV: 88.7 fL (ref 80.0–100.0)
Platelets: 329 10*3/uL (ref 150–400)
RBC: 4.34 MIL/uL (ref 3.87–5.11)
RDW: 14.1 % (ref 11.5–15.5)
WBC: 7.6 10*3/uL (ref 4.0–10.5)
nRBC: 0 % (ref 0.0–0.2)

## 2018-09-18 LAB — I-STAT BETA HCG BLOOD, ED (MC, WL, AP ONLY): I-stat hCG, quantitative: <5 m[IU]/mL (ref <5)

## 2018-09-18 MED ORDER — SODIUM CHLORIDE 0.9% FLUSH: 3.0000 mL | Freq: Once | INTRAVENOUS | Status: DC

## 2018-09-18 NOTE — ED Triage Notes (Signed)
PER pt she has been having chest pain x 2 days then tonight had SOB and got scared. Said hard to catch her breath. No coughing or chills. Pt says it hurts in her back also. No distress noted at this time

## 2018-09-19 LAB — BASIC METABOLIC PANEL
Anion gap: 11 (ref 5–15)
BUN: 9 mg/dL (ref 6–20)
CO2: 26 mmol/L (ref 22–32)
Calcium: 9.7 mg/dL (ref 8.9–10.3)
Chloride: 102 mmol/L (ref 98–111)
Creatinine, Ser: 0.9 mg/dL (ref 0.44–1.00)
GFR calc Af Amer: >60 mL/min (ref >60)
GFR calc non Af Amer: >60 mL/min (ref >60)
Glucose, Bld: 95 mg/dL (ref 70–99)
Potassium: 3.5 mmol/L (ref 3.5–5.1)
Sodium: 139 mmol/L (ref 135–145)

## 2018-09-19 LAB — HIV ANTIBODY (ROUTINE TESTING W REFLEX): HIV Screen 4th Generation wRfx: NONREACTIVE

## 2018-09-19 LAB — SARS CORONAVIRUS 2 BY RT PCR (HOSPITAL ORDER, PERFORMED IN ~~LOC~~ HOSPITAL LAB): SARS Coronavirus 2: NEGATIVE

## 2018-09-19 LAB — TROPONIN I: Troponin I: <0.03 ng/mL (ref <0.03)

## 2018-09-19 MED ORDER — SODIUM CHLORIDE 0.9 % IV BOLUS
1000.0000 mL | Freq: Once | INTRAVENOUS | Status: AC
  Administered 2018-09-19: 01:00:00 1000 mL via INTRAVENOUS

## 2018-09-19 MED ORDER — DEXAMETHASONE SODIUM PHOSPHATE 10 MG/ML IJ SOLN
10.0000 mg | Freq: Once | INTRAMUSCULAR | Status: AC
  Administered 2018-09-19: 10 mg via INTRAVENOUS
  Filled 2018-09-19: qty 1

## 2018-09-19 MED ORDER — DIPHENHYDRAMINE HCL 50 MG/ML IJ SOLN
25.0000 mg | Freq: Once | INTRAMUSCULAR | Status: AC
  Administered 2018-09-19: 25 mg via INTRAVENOUS
  Filled 2018-09-19: qty 1

## 2018-09-19 MED ORDER — METOCLOPRAMIDE HCL 5 MG/ML IJ SOLN
10.0000 mg | Freq: Once | INTRAMUSCULAR | Status: AC
  Administered 2018-09-19: 02:00:00 10 mg via INTRAVENOUS
  Filled 2018-09-19: qty 2

## 2018-09-19 MED ORDER — ONDANSETRON 4 MG PO TBDP: 8.0000 mg | ORAL_TABLET | Freq: Once | ORAL | Status: DC

## 2018-09-19 NOTE — ED Provider Notes (Signed)
[facility] EMERGENCY DEPARTMENT Provider Note   CSN: [id number] Arrival date & time: 09/18/18  2240    History   Chief Complaint Chief Complaint  Patient presents with  . Chest Pain  . Back Pain    HPI Robin Horn is a 33 y.o. female.   The history is provided by the patient.  She has history of polycystic kidney disease, gestational diabetes, migraines and comes in because of flulike symptoms.  She started having a bitemporal headache about a week ago.  It would get better with analgesics, but recur.  About 3 days ago, she started having pain in her mid chest and in the lateral rib cages bilaterally.  She also had fever to 102 with associated chills and sweats.  Fever has not come back since 3 days ago, but chest pain has persisted.  There is some slight rhinorrhea which is green.  She is not coughing.  There is a sore throat.  She has not noted any alteration of smell or taste.  She is complaining of nausea but has not vomited.  She denies arthralgias or myalgias.  She has been exposed to someone with COVID-19.  Past Medical History:  Diagnosis Date  . Abnormal Pap smear of cervix   . Anxiety   . Chlamydia   . Depression   . Gestational diabetes 2018  . Gonorrhea   . Herpes genitalia   . Migraines   . Ovarian cyst   . Polycystic kidney disease   . Preeclampsia   . Pregnancy induced hypertension     Patient Active Problem List   Diagnosis Date Noted  . Encounter for female sterilization procedure 09/24/2016  . Positive GBS test 09/23/2016  . Supervision of other high risk pregnancy, antepartum 09/20/2016  . Pregnancy affected by fetal growth restriction 09/20/2016  . GDM (gestational diabetes mellitus) 09/11/2016  . Gestational hypertension without significant proteinuria 08/29/2016  . History of postpartum hemorrhage, currently pregnant 05/25/2016  . Drug use affecting pregnancy, antepartum 05/25/2016  . Bipolar 1 disorder ([place]) 05/25/2016  .  Insomnia 05/25/2016  . Monochorionic diamniotic twin gestation in third trimester 04/25/2016    Past Surgical History:  Procedure Laterality Date  . COLPOSCOPY    . DILATION AND CURETTAGE OF UTERUS    . TONSILLECTOMY    . TUBAL LIGATION Bilateral 09/24/2016   Procedure: POST PARTUM TUBAL LIGATION;  Surgeon: Lavonia Drafts, MD;  Location: [place];  Service: Gynecology;  Laterality: Bilateral;     OB History    Gravida  8   Para  5   Term  4   Preterm  1   AB  3   Living  6     SAB  0   TAB  3   Ectopic  0   Multiple  1   Live Births  6            Home Medications    Prior to Admission medications   Medication Sig Start Date End Date Taking? Authorizing Provider  meloxicam (MOBIC) 15 MG tablet Take 1 tablet (15 mg total) by mouth daily. Patient not taking: Reported on 05/09/2018 10/06/17   Ok Edwards, PA-C  ondansetron (ZOFRAN ODT) 4 MG disintegrating tablet Take 1 tablet (4 mg total) by mouth every 8 (eight) hours as needed for nausea or vomiting. 05/09/18   Drenda Freeze, MD  oxyCODONE-acetaminophen (PERCOCET) 10-325 MG tablet Take 1 tablet by mouth once.    [provider]    Family History Family History  Problem Relation Age of Onset  . Kidney disease Mother   . Cancer Paternal Grandmother   . Other Neg Hx     Social History Social History   Tobacco Use  . Smoking status: Light Tobacco Smoker    Packs/day: 0.25    Types: Cigarettes  . Smokeless tobacco: Never Used  Substance Use Topics  . Alcohol use: Yes    Alcohol/week: 1.0 standard drinks    Types: 1 Glasses of wine per week    Comment: Once every 2 months. Nothing since finding out about pregnancy.   . Drug use: No    Types: Cocaine    Comment: none since +preg     Allergies   Lactose intolerance (gi)   Review of Systems Review of Systems  All other systems reviewed and are negative.    Physical Exam Updated Vital Signs BP (!) 152/97 (BP  Location: Right Arm)   Pulse 64   Temp 98.7 F (37.1 C) (Oral)   Resp 14   Ht 5\' 2"  (1.575 m)   Wt 67.6 kg   LMP 09/03/2018   SpO2 100%   BMI 27.25 kg/m   Physical Exam Vitals signs and nursing note reviewed.    33 year old female, resting comfortably and in no acute distress. Vital signs are significant for elevated blood pressure. Oxygen saturation is 100%, which is normal. Head is normocephalic and atraumatic. PERRLA, EOMI. Oropharynx is clear. Neck is nontender and supple without adenopathy or JVD. Back is nontender and there is no CVA tenderness. Lungs are clear without rales, wheezes, or rhonchi. Chest is nontender. Heart has regular rate and rhythm without murmur. Abdomen is soft, flat, nontender without masses or hepatosplenomegaly and peristalsis is normoactive. Extremities have no cyanosis or edema, full range of motion is present. Skin is warm and dry without rash. Neurologic: Mental status is normal, cranial nerves are intact, there are no motor or sensory deficits.  ED Treatments / Results  Labs (all labs ordered are listed, but only abnormal results are displayed) Labs Reviewed  SARS CORONAVIRUS 2 ([facility])  BASIC METABOLIC PANEL  CBC  TROPONIN I  HIV ANTIBODY (ROUTINE TESTING W REFLEX)  I-STAT BETA HCG BLOOD, ED ([facility], [facility], [facility] ONLY)    EKG EKG Interpretation  Date/Time:  Wednesday Sep 18 2018 23:20:54 EDT Ventricular Rate:  74 PR Interval:  136 QRS Duration: 82 QT Interval:  374 QTC Calculation: 415 R Axis:   83 Text Interpretation:  Normal sinus rhythm Nonspecific T wave abnormality Abnormal ECG When compared with ECG of 09/13/2015, No significant change was found Confirmed by Delora Fuel ([id number]) on 09/19/2018 12:33:51 AM   Radiology Dg Chest 2 View  Result Date: 09/18/2018 CLINICAL DATA:  Chest pain EXAM: CHEST - 2 VIEW COMPARISON:  05/09/2018 FINDINGS: The heart size and mediastinal contours are within  normal limits. Both lungs are clear. The visualized skeletal structures are unremarkable. IMPRESSION: No active cardiopulmonary disease. Electronically Signed   By: Donavan Foil M.D.   On: 09/18/2018 23:51    Procedures Procedures   Medications Ordered in ED Medications  sodium chloride flush (NS) 0.9 % injection 3 mL (3 mLs Intravenous Not Given 09/19/18 0028)  sodium chloride 0.9 % bolus 1,000 mL (0 mLs Intravenous Stopped 09/19/18 0203)  metoCLOPramide (REGLAN) injection 10 mg (10 mg Intravenous Given 09/19/18 0132)  diphenhydrAMINE (BENADRYL) injection 25 mg (25 mg Intravenous Given 09/19/18  0132)  dexamethasone (DECADRON) injection 10 mg (10 mg Intravenous Given 09/19/18 0132)     Initial Impression / Assessment and Plan / ED Course  I have reviewed the triage vital signs and the nursing notes.  Pertinent labs & imaging results that were available during my care of the patient were reviewed by me and considered in my medical decision making (see chart for details).  Influenza-like illness.  Given prevalence of COVID-19 in the community, I am very concerned about that being the cause of her symptoms.  Nasal swab is sent for COVID-19 testing.  She is most concerned about her headache now.  She is given a migraine cocktail of normal saline, metoclopramide, diphenhydramine, dexamethasone.  Old records are reviewed, and she has no relevant past visits.  She feels much better after above-noted treatment.  COVID-19 test has come back negative.  I have explained this to the patient, including the risk of false negative tests.  She is discharged with strict return instructions.  CELA NEWCOM was evaluated in Emergency Department on 09/19/2018 for the symptoms described in the history of present illness. She was evaluated in the context of the global COVID-19 pandemic, which necessitated consideration that the patient might be at risk for infection with the SARS-CoV-2 virus that causes COVID-19.  Institutional protocols and algorithms that pertain to the evaluation of patients at risk for COVID-19 are in a state of rapid change based on information released by regulatory bodies including the CDC and federal and state organizations. These policies and algorithms were followed during the patient's care in the ED.   Final Clinical Impressions(s) / ED Diagnoses   Final diagnoses:  Influenza-like illness  Bad headache    ED Discharge Orders    None       Delora Fuel, MD 93/73/42 [phone]

## 2018-09-19 NOTE — Discharge Instructions (Addendum)
Your test for the coronavirus was negative.  However, it is possible to have a false negative test.  Drink plenty of fluids.  Take acetaminophen as needed for fever or headache or other aching.  Return to the emergency department if you develop difficulty breathing, persistent fever, confusion, or any other symptoms that are of concern to you.

## 2018-09-19 NOTE — ED Notes (Signed)
Pt resting comfortably

## 2018-11-23 ENCOUNTER — Emergency Department (HOSPITAL_COMMUNITY): Payer: Self-pay

## 2018-11-23 ENCOUNTER — Encounter (HOSPITAL_COMMUNITY): Payer: Self-pay | Admitting: Emergency Medicine

## 2018-11-23 ENCOUNTER — Emergency Department (HOSPITAL_COMMUNITY)
Admission: EM | Admit: 2018-11-23 | Discharge: 2018-11-23 | Disposition: A | Discharge status: Home / Self Care | Payer: Self-pay | Attending: Emergency Medicine | Admitting: Emergency Medicine

## 2018-11-23 ENCOUNTER — Other Ambulatory Visit: Payer: Self-pay

## 2018-11-23 DIAGNOSIS — F17210 Nicotine dependence, cigarettes, uncomplicated: Secondary | ICD-10-CM | POA: Insufficient documentation

## 2018-11-23 DIAGNOSIS — F1910 Other psychoactive substance abuse, uncomplicated: Secondary | ICD-10-CM

## 2018-11-23 DIAGNOSIS — F329 Major depressive disorder, single episode, unspecified: Secondary | ICD-10-CM | POA: Insufficient documentation

## 2018-11-23 DIAGNOSIS — M25561 Pain in right knee: Secondary | ICD-10-CM | POA: Insufficient documentation

## 2018-11-23 DIAGNOSIS — F1110 Opioid abuse, uncomplicated: Secondary | ICD-10-CM | POA: Insufficient documentation

## 2018-11-23 DIAGNOSIS — R440 Auditory hallucinations: Secondary | ICD-10-CM | POA: Insufficient documentation

## 2018-11-23 MED ORDER — NAPROXEN 500 MG PO TABS: 500.0000 mg | ORAL_TABLET | Freq: Two times a day (BID) | ORAL | 0 refills | Status: DC

## 2018-11-23 MED ORDER — LOPERAMIDE HCL 2 MG PO CAPS: 2.0000 mg | ORAL_CAPSULE | Freq: Four times a day (QID) | ORAL | 0 refills | Status: DC | PRN

## 2018-11-23 MED ORDER — ONDANSETRON 4 MG PO TBDP: 4.0000 mg | ORAL_TABLET | Freq: Three times a day (TID) | ORAL | 0 refills | Status: DC | PRN

## 2018-11-23 NOTE — ED Notes (Signed)
Pt denies SI or HI and does not want urine/blood/covid testing informed provider of pt not wanting these services.

## 2018-11-23 NOTE — ED Notes (Signed)
Unable to locate pt to discharge and give discharge instructions.

## 2018-11-23 NOTE — Discharge Instructions (Signed)
You were seen in the emergency department for evaluation of right knee pain and also looking for help with prescription pain medicine abuse.  You were offered a psychiatric evaluation which you declined.  We are sending you home with some medication for nausea, diarrhea, and pain.  We are also sending you home with a list of resources to call regarding substance abuse.  Please return to the emergency department if any worsening symptoms.

## 2018-11-23 NOTE — ED Provider Notes (Signed)
[facility] DEPT Provider Note   CSN: [id number] Arrival date & time: 11/23/18  1450     History   Chief Complaint Chief Complaint  Patient presents with  . detox  . Knee Pain    HPI Robin Horn is a 33 y.o. female.  She is presenting to the hospital with 2 medical complaints.  #1 is a right knee injury in which she thinks she dislocated her patella 2 days ago.  She was on the floor and getting up she felt a pop and she had to push her kneecap back onto the front of her leg.  Since then she has had knee pain.  #2 is that she is abusing prescription pain medication.  She said she takes about 3010 mg oxycodone tablets a day and is been like this for a year.  It started with the doctors prescription but now she is buying them on the street.  She does endorse withdrawal symptoms of vomiting and diarrhea if she does not take medications.  She said she is thought of hurting herself especially when she does not have medication and she is hearing voices.  She is used to be under psychiatric care and on medications but has not been for many months. Denies injection drugs.    The history is provided by the patient.  Mental Health Problem Presenting symptoms: depression and suicidal thoughts   Degree of incapacity (severity):  Unable to specify Onset quality:  Gradual Timing:  Constant Progression:  Worsening Chronicity:  New Context: drug abuse and noncompliance   Treatment compliance:  Untreated Worsened by:  Drugs Ineffective treatments:  None tried Associated symptoms: feelings of worthlessness   Associated symptoms: no abdominal pain, no chest pain and no headaches   Risk factors: hx of mental illness     Past Medical History:  Diagnosis Date  . Abnormal Pap smear of cervix   . Anxiety   . Chlamydia   . Depression   . Gestational diabetes 2018  . Gonorrhea   . Herpes genitalia   . Migraines   . Ovarian cyst   . Polycystic kidney disease   .  Preeclampsia   . Pregnancy induced hypertension     Patient Active Problem List   Diagnosis Date Noted  . Encounter for female sterilization procedure 09/24/2016  . Positive GBS test 09/23/2016  . Supervision of other high risk pregnancy, antepartum 09/20/2016  . Pregnancy affected by fetal growth restriction 09/20/2016  . GDM (gestational diabetes mellitus) 09/11/2016  . Gestational hypertension without significant proteinuria 08/29/2016  . History of postpartum hemorrhage, currently pregnant 05/25/2016  . Drug use affecting pregnancy, antepartum 05/25/2016  . Bipolar 1 disorder ([place]) 05/25/2016  . Insomnia 05/25/2016  . Monochorionic diamniotic twin gestation in third trimester 04/25/2016    Past Surgical History:  Procedure Laterality Date  . COLPOSCOPY    . DILATION AND CURETTAGE OF UTERUS    . TONSILLECTOMY    . TUBAL LIGATION Bilateral 09/24/2016   Procedure: POST PARTUM TUBAL LIGATION;  Surgeon: Lavonia Drafts, MD;  Location: [place];  Service: Gynecology;  Laterality: Bilateral;     OB History    Gravida  8   Para  5   Term  4   Preterm  1   AB  3   Living  6     SAB  0   TAB  3   Ectopic  0   Multiple  1   Live Births  6            Home Medications    Prior to Admission medications   Medication Sig Start Date End Date Taking? Authorizing Provider  ondansetron (ZOFRAN ODT) 4 MG disintegrating tablet Take 1 tablet (4 mg total) by mouth every 8 (eight) hours as needed for nausea or vomiting. 05/09/18   Drenda Freeze, MD    Family History Family History  Problem Relation Age of Onset  . Kidney disease Mother   . Cancer Paternal Grandmother   . Other Neg Hx     Social History Social History   Tobacco Use  . Smoking status: Light Tobacco Smoker    Packs/day: 0.25    Types: Cigarettes  . Smokeless tobacco: Never Used  Substance Use Topics  . Alcohol use: Yes    Alcohol/week: 1.0 standard drinks    Types: 1  Glasses of wine per week    Comment: Once every 2 months. Nothing since finding out about pregnancy.   . Drug use: No    Types: Cocaine    Comment: none since +preg     Allergies   Lactose intolerance (gi)   Review of Systems Review of Systems  Constitutional: Negative for fever.  HENT: Negative for sore throat.   Eyes: Negative for visual disturbance.  Respiratory: Negative for shortness of breath.   Cardiovascular: Negative for chest pain.  Gastrointestinal: Negative for abdominal pain.  Genitourinary: Negative for dysuria.  Musculoskeletal: Negative for neck pain.  Skin: Negative for rash.  Neurological: Negative for headaches.  Psychiatric/Behavioral: Positive for suicidal ideas.     Physical Exam Updated Vital Signs BP (!) 149/105 (BP Location: Left Arm)   Pulse 87   Temp 99.2 F (37.3 C) (Oral)   Resp 18   Wt 72.6 kg   LMP 11/02/2018   SpO2 100%   BMI 29.26 kg/m   Physical Exam Vitals signs and nursing note reviewed.  Constitutional:      General: She is not in acute distress.    Appearance: She is well-developed.  HENT:     Head: Normocephalic and atraumatic.  Eyes:     Conjunctiva/sclera: Conjunctivae normal.  Neck:     Musculoskeletal: Neck supple.  Cardiovascular:     Rate and Rhythm: Normal rate and regular rhythm.     Heart sounds: No murmur.  Pulmonary:     Effort: Pulmonary effort is normal. No respiratory distress.     Breath sounds: Normal breath sounds.  Abdominal:     Palpations: Abdomen is soft.     Tenderness: There is no abdominal tenderness.  Musculoskeletal: Normal range of motion.        General: Tenderness present. No swelling or deformity.     Right lower leg: No edema.     Left lower leg: No edema.     Comments: She is some tenderness of the right patella and the lateral knee although no instability and no effusion.  Extensor mechanism intact.  No overlying erythema or open wounds.  Skin:    General: Skin is warm and dry.      Capillary Refill: Capillary refill takes less than 2 seconds.  Neurological:     General: No focal deficit present.     Mental Status: She is alert and oriented to person, place, and time.  Psychiatric:        Mood and Affect: Mood is depressed.      ED Treatments / Results  Labs (all labs ordered are  listed, but only abnormal results are displayed) Labs Reviewed - No data to display  EKG None  Radiology No results found.  Procedures Procedures (including critical care time)  Medications Ordered in ED Medications - No data to display   Initial Impression / Assessment and Plan / ED Course  I have reviewed the triage vital signs and the nursing notes.  Pertinent labs & imaging results that were available during my care of the patient were reviewed by me and considered in my medical decision making (see chart for details).  Clinical Course as of Nov 23 1021  Sat Nov 22, 5208  [id number] 33 year old female here with a possible patellar dislocation relocation complaining of knee pain.  For that she will get an x-ray.  Her exam is otherwise benign.  She is also looking for help with her opiate addiction.  She has some possible suicidal ideation and she admits to hearing voices.   [MB]  60 Was informed by the nurse that the patient now is not feeling suicidal and does not wish to stay for psychiatric evaluation.  She only wants her x-ray to be discharged.  I will send her home with the resource list for substance services.   [MB]    Clinical Course User Index [MB] Hayden Rasmussen, MD   Robin Horn was evaluated in Emergency Department on 11/23/2018 for the symptoms described in the history of present illness. She was evaluated in the context of the global COVID-19 pandemic, which necessitated consideration that the patient might be at risk for infection with the SARS-CoV-2 virus that causes COVID-19. Institutional protocols and algorithms that pertain to the evaluation of  patients at risk for COVID-19 are in a state of rapid change based on information released by regulatory bodies including the CDC and federal and state organizations. These policies and algorithms were followed during the patient's care in the ED.      Final Clinical Impressions(s) / ED Diagnoses   Final diagnoses:  Acute pain of right knee  Substance abuse [facility])    ED Discharge Orders    None       Hayden Rasmussen, MD 11/24/18 1024

## 2018-11-23 NOTE — Patient Outreach (Signed)
CPSS met with the patient in order to provide substance use recovery support and help with getting connected to substance use treatment resources. Patient reports a history of opioid use, specifically taking percocet daily. Patient reports she wants help getting connected to substance use recovery resources. CPSS explained and provided information for three different substance use recovery resources including an NA meeting list, residential detox substance use treatment center list, and outpatient substance use treatment center list. Patient plans to go over the resource information with family supports and decide from there what substance use recovery resource would work best to help the patient's substance use recovery process. CPSS also provided CPSS contact information. CPSS strongly encouraged the patient to continue to stay in contact with CPSS for further help with getting connected to substance use recovery resources after discharge from the [facility]. Patient was pleasant, cooperative, and expressed gratitude to [facility]PSS for the CPSS services provided.

## 2018-11-23 NOTE — ED Triage Notes (Signed)
Pt reports wants help detoxing from Oxycodone. Reports last use was earlier today.  Also wants right knee examined as having pains from few days ago when popped. Pt ambulatory with steady gait.

## 2019-02-10 ENCOUNTER — Ambulatory Visit: Payer: Self-pay | Admitting: Family Medicine

## 2019-10-30 DIAGNOSIS — Z419 Encounter for procedure for purposes other than remedying health state, unspecified: Secondary | ICD-10-CM | POA: Diagnosis not present

## 2019-10-31 ENCOUNTER — Other Ambulatory Visit: Payer: Self-pay | Admitting: Nurse Practitioner

## 2019-10-31 DIAGNOSIS — M419 Scoliosis, unspecified: Secondary | ICD-10-CM

## 2019-11-19 ENCOUNTER — Other Ambulatory Visit: Payer: Self-pay

## 2019-11-26 DIAGNOSIS — Z79899 Other long term (current) drug therapy: Secondary | ICD-10-CM | POA: Diagnosis not present

## 2019-11-26 DIAGNOSIS — F419 Anxiety disorder, unspecified: Secondary | ICD-10-CM | POA: Diagnosis not present

## 2019-11-26 DIAGNOSIS — G8929 Other chronic pain: Secondary | ICD-10-CM | POA: Diagnosis not present

## 2019-11-26 DIAGNOSIS — F17210 Nicotine dependence, cigarettes, uncomplicated: Secondary | ICD-10-CM | POA: Diagnosis not present

## 2019-11-26 DIAGNOSIS — G629 Polyneuropathy, unspecified: Secondary | ICD-10-CM | POA: Diagnosis not present

## 2019-11-26 DIAGNOSIS — N915 Oligomenorrhea, unspecified: Secondary | ICD-10-CM | POA: Diagnosis not present

## 2019-11-26 DIAGNOSIS — M545 Low back pain: Secondary | ICD-10-CM | POA: Diagnosis not present

## 2019-11-26 DIAGNOSIS — M419 Scoliosis, unspecified: Secondary | ICD-10-CM | POA: Diagnosis not present

## 2019-11-26 DIAGNOSIS — F329 Major depressive disorder, single episode, unspecified: Secondary | ICD-10-CM | POA: Diagnosis not present

## 2019-11-30 ENCOUNTER — Inpatient Hospital Stay: Admission: RE | Admit: 2019-11-30 | Payer: Self-pay | Source: Ambulatory Visit

## 2019-11-30 ENCOUNTER — Other Ambulatory Visit: Payer: Self-pay

## 2019-11-30 DIAGNOSIS — Z419 Encounter for procedure for purposes other than remedying health state, unspecified: Secondary | ICD-10-CM | POA: Diagnosis not present

## 2019-12-24 DIAGNOSIS — N915 Oligomenorrhea, unspecified: Secondary | ICD-10-CM | POA: Diagnosis not present

## 2019-12-24 DIAGNOSIS — M419 Scoliosis, unspecified: Secondary | ICD-10-CM | POA: Diagnosis not present

## 2019-12-24 DIAGNOSIS — G629 Polyneuropathy, unspecified: Secondary | ICD-10-CM | POA: Diagnosis not present

## 2019-12-24 DIAGNOSIS — Z79899 Other long term (current) drug therapy: Secondary | ICD-10-CM | POA: Diagnosis not present

## 2019-12-24 DIAGNOSIS — G8929 Other chronic pain: Secondary | ICD-10-CM | POA: Diagnosis not present

## 2019-12-24 DIAGNOSIS — F17210 Nicotine dependence, cigarettes, uncomplicated: Secondary | ICD-10-CM | POA: Diagnosis not present

## 2019-12-24 DIAGNOSIS — F419 Anxiety disorder, unspecified: Secondary | ICD-10-CM | POA: Diagnosis not present

## 2019-12-24 DIAGNOSIS — F329 Major depressive disorder, single episode, unspecified: Secondary | ICD-10-CM | POA: Diagnosis not present

## 2019-12-24 DIAGNOSIS — M545 Low back pain: Secondary | ICD-10-CM | POA: Diagnosis not present

## 2019-12-25 ENCOUNTER — Other Ambulatory Visit: Payer: Self-pay

## 2019-12-31 DIAGNOSIS — Z419 Encounter for procedure for purposes other than remedying health state, unspecified: Secondary | ICD-10-CM | POA: Diagnosis not present

## 2020-01-20 DIAGNOSIS — F329 Major depressive disorder, single episode, unspecified: Secondary | ICD-10-CM | POA: Diagnosis not present

## 2020-01-20 DIAGNOSIS — F419 Anxiety disorder, unspecified: Secondary | ICD-10-CM | POA: Diagnosis not present

## 2020-01-20 DIAGNOSIS — F17210 Nicotine dependence, cigarettes, uncomplicated: Secondary | ICD-10-CM | POA: Diagnosis not present

## 2020-01-20 DIAGNOSIS — N915 Oligomenorrhea, unspecified: Secondary | ICD-10-CM | POA: Diagnosis not present

## 2020-01-20 DIAGNOSIS — Z79899 Other long term (current) drug therapy: Secondary | ICD-10-CM | POA: Diagnosis not present

## 2020-01-20 DIAGNOSIS — G8929 Other chronic pain: Secondary | ICD-10-CM | POA: Diagnosis not present

## 2020-01-20 DIAGNOSIS — M545 Low back pain: Secondary | ICD-10-CM | POA: Diagnosis not present

## 2020-01-30 DIAGNOSIS — Z419 Encounter for procedure for purposes other than remedying health state, unspecified: Secondary | ICD-10-CM | POA: Diagnosis not present

## 2020-02-16 DIAGNOSIS — F32A Depression, unspecified: Secondary | ICD-10-CM | POA: Diagnosis not present

## 2020-02-16 DIAGNOSIS — F419 Anxiety disorder, unspecified: Secondary | ICD-10-CM | POA: Diagnosis not present

## 2020-02-16 DIAGNOSIS — F17210 Nicotine dependence, cigarettes, uncomplicated: Secondary | ICD-10-CM | POA: Diagnosis not present

## 2020-02-16 DIAGNOSIS — G8929 Other chronic pain: Secondary | ICD-10-CM | POA: Diagnosis not present

## 2020-02-16 DIAGNOSIS — M545 Low back pain, unspecified: Secondary | ICD-10-CM | POA: Diagnosis not present

## 2020-02-16 DIAGNOSIS — Z79899 Other long term (current) drug therapy: Secondary | ICD-10-CM | POA: Diagnosis not present

## 2020-02-16 DIAGNOSIS — N915 Oligomenorrhea, unspecified: Secondary | ICD-10-CM | POA: Diagnosis not present

## 2020-02-16 DIAGNOSIS — Z23 Encounter for immunization: Secondary | ICD-10-CM | POA: Diagnosis not present

## 2020-03-01 DIAGNOSIS — G8929 Other chronic pain: Secondary | ICD-10-CM | POA: Diagnosis not present

## 2020-03-01 DIAGNOSIS — M545 Low back pain, unspecified: Secondary | ICD-10-CM | POA: Diagnosis not present

## 2020-03-01 DIAGNOSIS — G4700 Insomnia, unspecified: Secondary | ICD-10-CM | POA: Diagnosis not present

## 2020-03-01 DIAGNOSIS — F419 Anxiety disorder, unspecified: Secondary | ICD-10-CM | POA: Diagnosis not present

## 2020-03-01 DIAGNOSIS — Z419 Encounter for procedure for purposes other than remedying health state, unspecified: Secondary | ICD-10-CM | POA: Diagnosis not present

## 2020-03-01 DIAGNOSIS — N915 Oligomenorrhea, unspecified: Secondary | ICD-10-CM | POA: Diagnosis not present

## 2020-03-01 DIAGNOSIS — F17210 Nicotine dependence, cigarettes, uncomplicated: Secondary | ICD-10-CM | POA: Diagnosis not present

## 2020-03-01 DIAGNOSIS — Z79899 Other long term (current) drug therapy: Secondary | ICD-10-CM | POA: Diagnosis not present

## 2020-03-15 DIAGNOSIS — G8929 Other chronic pain: Secondary | ICD-10-CM | POA: Diagnosis not present

## 2020-03-15 DIAGNOSIS — N915 Oligomenorrhea, unspecified: Secondary | ICD-10-CM | POA: Diagnosis not present

## 2020-03-15 DIAGNOSIS — M545 Low back pain, unspecified: Secondary | ICD-10-CM | POA: Diagnosis not present

## 2020-03-15 DIAGNOSIS — Z79899 Other long term (current) drug therapy: Secondary | ICD-10-CM | POA: Diagnosis not present

## 2020-03-15 DIAGNOSIS — F17210 Nicotine dependence, cigarettes, uncomplicated: Secondary | ICD-10-CM | POA: Diagnosis not present

## 2020-03-15 DIAGNOSIS — I10 Essential (primary) hypertension: Secondary | ICD-10-CM | POA: Diagnosis not present

## 2020-03-31 DIAGNOSIS — Z419 Encounter for procedure for purposes other than remedying health state, unspecified: Secondary | ICD-10-CM | POA: Diagnosis not present

## 2020-04-08 DIAGNOSIS — F17210 Nicotine dependence, cigarettes, uncomplicated: Secondary | ICD-10-CM | POA: Diagnosis not present

## 2020-04-08 DIAGNOSIS — G4700 Insomnia, unspecified: Secondary | ICD-10-CM | POA: Diagnosis not present

## 2020-04-08 DIAGNOSIS — N915 Oligomenorrhea, unspecified: Secondary | ICD-10-CM | POA: Diagnosis not present

## 2020-04-08 DIAGNOSIS — F32A Depression, unspecified: Secondary | ICD-10-CM | POA: Diagnosis not present

## 2020-04-08 DIAGNOSIS — G894 Chronic pain syndrome: Secondary | ICD-10-CM | POA: Diagnosis not present

## 2020-04-08 DIAGNOSIS — M545 Low back pain, unspecified: Secondary | ICD-10-CM | POA: Diagnosis not present

## 2020-04-08 DIAGNOSIS — G8929 Other chronic pain: Secondary | ICD-10-CM | POA: Diagnosis not present

## 2020-04-08 DIAGNOSIS — Z79899 Other long term (current) drug therapy: Secondary | ICD-10-CM | POA: Diagnosis not present

## 2020-04-08 DIAGNOSIS — F419 Anxiety disorder, unspecified: Secondary | ICD-10-CM | POA: Diagnosis not present

## 2020-04-08 DIAGNOSIS — I10 Essential (primary) hypertension: Secondary | ICD-10-CM | POA: Diagnosis not present

## 2020-05-01 DIAGNOSIS — Z419 Encounter for procedure for purposes other than remedying health state, unspecified: Secondary | ICD-10-CM | POA: Diagnosis not present

## 2020-05-05 DIAGNOSIS — Z79899 Other long term (current) drug therapy: Secondary | ICD-10-CM | POA: Diagnosis not present

## 2020-05-05 DIAGNOSIS — I10 Essential (primary) hypertension: Secondary | ICD-10-CM | POA: Diagnosis not present

## 2020-05-05 DIAGNOSIS — N915 Oligomenorrhea, unspecified: Secondary | ICD-10-CM | POA: Diagnosis not present

## 2020-05-05 DIAGNOSIS — Z7251 High risk heterosexual behavior: Secondary | ICD-10-CM | POA: Diagnosis not present

## 2020-05-05 DIAGNOSIS — F17210 Nicotine dependence, cigarettes, uncomplicated: Secondary | ICD-10-CM | POA: Diagnosis not present

## 2020-05-05 DIAGNOSIS — M545 Low back pain, unspecified: Secondary | ICD-10-CM | POA: Diagnosis not present

## 2020-05-05 DIAGNOSIS — G8929 Other chronic pain: Secondary | ICD-10-CM | POA: Diagnosis not present

## 2020-05-05 DIAGNOSIS — A64 Unspecified sexually transmitted disease: Secondary | ICD-10-CM | POA: Diagnosis not present

## 2020-05-24 DIAGNOSIS — G43009 Migraine without aura, not intractable, without status migrainosus: Secondary | ICD-10-CM | POA: Diagnosis not present

## 2020-05-24 DIAGNOSIS — Q613 Polycystic kidney, unspecified: Secondary | ICD-10-CM | POA: Diagnosis not present

## 2020-05-24 DIAGNOSIS — N10 Acute tubulo-interstitial nephritis: Secondary | ICD-10-CM | POA: Diagnosis not present

## 2020-05-24 DIAGNOSIS — Z79899 Other long term (current) drug therapy: Secondary | ICD-10-CM | POA: Diagnosis not present

## 2020-05-24 DIAGNOSIS — F17210 Nicotine dependence, cigarettes, uncomplicated: Secondary | ICD-10-CM | POA: Diagnosis not present

## 2020-06-01 DIAGNOSIS — Z419 Encounter for procedure for purposes other than remedying health state, unspecified: Secondary | ICD-10-CM | POA: Diagnosis not present

## 2020-06-21 DIAGNOSIS — F17210 Nicotine dependence, cigarettes, uncomplicated: Secondary | ICD-10-CM | POA: Diagnosis not present

## 2020-06-21 DIAGNOSIS — Z79899 Other long term (current) drug therapy: Secondary | ICD-10-CM | POA: Diagnosis not present

## 2020-06-21 DIAGNOSIS — G8929 Other chronic pain: Secondary | ICD-10-CM | POA: Diagnosis not present

## 2020-06-21 DIAGNOSIS — M62838 Other muscle spasm: Secondary | ICD-10-CM | POA: Diagnosis not present

## 2020-06-21 DIAGNOSIS — N926 Irregular menstruation, unspecified: Secondary | ICD-10-CM | POA: Diagnosis not present

## 2020-06-21 DIAGNOSIS — M545 Low back pain, unspecified: Secondary | ICD-10-CM | POA: Diagnosis not present

## 2020-06-21 DIAGNOSIS — G43009 Migraine without aura, not intractable, without status migrainosus: Secondary | ICD-10-CM | POA: Diagnosis not present

## 2020-06-21 DIAGNOSIS — G894 Chronic pain syndrome: Secondary | ICD-10-CM | POA: Diagnosis not present

## 2020-06-29 DIAGNOSIS — Z419 Encounter for procedure for purposes other than remedying health state, unspecified: Secondary | ICD-10-CM | POA: Diagnosis not present

## 2020-07-05 DIAGNOSIS — G43009 Migraine without aura, not intractable, without status migrainosus: Secondary | ICD-10-CM | POA: Diagnosis not present

## 2020-07-05 DIAGNOSIS — G8929 Other chronic pain: Secondary | ICD-10-CM | POA: Diagnosis not present

## 2020-07-05 DIAGNOSIS — Z79899 Other long term (current) drug therapy: Secondary | ICD-10-CM | POA: Diagnosis not present

## 2020-07-05 DIAGNOSIS — G4700 Insomnia, unspecified: Secondary | ICD-10-CM | POA: Diagnosis not present

## 2020-07-05 DIAGNOSIS — M545 Low back pain, unspecified: Secondary | ICD-10-CM | POA: Diagnosis not present

## 2020-07-05 DIAGNOSIS — G894 Chronic pain syndrome: Secondary | ICD-10-CM | POA: Diagnosis not present

## 2020-07-05 DIAGNOSIS — J302 Other seasonal allergic rhinitis: Secondary | ICD-10-CM | POA: Diagnosis not present

## 2020-07-05 DIAGNOSIS — N926 Irregular menstruation, unspecified: Secondary | ICD-10-CM | POA: Diagnosis not present

## 2020-07-05 DIAGNOSIS — F17210 Nicotine dependence, cigarettes, uncomplicated: Secondary | ICD-10-CM | POA: Diagnosis not present

## 2020-07-21 DIAGNOSIS — F17210 Nicotine dependence, cigarettes, uncomplicated: Secondary | ICD-10-CM | POA: Diagnosis not present

## 2020-07-21 DIAGNOSIS — G894 Chronic pain syndrome: Secondary | ICD-10-CM | POA: Diagnosis not present

## 2020-07-21 DIAGNOSIS — R102 Pelvic and perineal pain: Secondary | ICD-10-CM | POA: Diagnosis not present

## 2020-07-21 DIAGNOSIS — G43009 Migraine without aura, not intractable, without status migrainosus: Secondary | ICD-10-CM | POA: Diagnosis not present

## 2020-07-21 DIAGNOSIS — Z79899 Other long term (current) drug therapy: Secondary | ICD-10-CM | POA: Diagnosis not present

## 2020-07-21 DIAGNOSIS — Q613 Polycystic kidney, unspecified: Secondary | ICD-10-CM | POA: Diagnosis not present

## 2020-07-21 DIAGNOSIS — G8929 Other chronic pain: Secondary | ICD-10-CM | POA: Diagnosis not present

## 2020-07-21 DIAGNOSIS — M545 Low back pain, unspecified: Secondary | ICD-10-CM | POA: Diagnosis not present

## 2020-07-21 DIAGNOSIS — A599 Trichomoniasis, unspecified: Secondary | ICD-10-CM | POA: Diagnosis not present

## 2020-07-21 DIAGNOSIS — N926 Irregular menstruation, unspecified: Secondary | ICD-10-CM | POA: Diagnosis not present

## 2020-07-21 DIAGNOSIS — N898 Other specified noninflammatory disorders of vagina: Secondary | ICD-10-CM | POA: Diagnosis not present

## 2020-07-21 DIAGNOSIS — G4700 Insomnia, unspecified: Secondary | ICD-10-CM | POA: Diagnosis not present

## 2020-07-22 ENCOUNTER — Other Ambulatory Visit: Payer: Self-pay

## 2020-07-22 ENCOUNTER — Encounter (HOSPITAL_COMMUNITY): Payer: Self-pay

## 2020-07-22 ENCOUNTER — Emergency Department (HOSPITAL_COMMUNITY)
Admission: EM | Admit: 2020-07-22 | Discharge: 2020-07-22 | Disposition: A | Discharge status: Home / Self Care | Payer: Medicaid Other | Attending: Emergency Medicine | Admitting: Emergency Medicine

## 2020-07-22 DIAGNOSIS — A599 Trichomoniasis, unspecified: Secondary | ICD-10-CM

## 2020-07-22 DIAGNOSIS — R102 Pelvic and perineal pain: Secondary | ICD-10-CM

## 2020-07-22 DIAGNOSIS — N898 Other specified noninflammatory disorders of vagina: Secondary | ICD-10-CM

## 2020-07-22 LAB — URINALYSIS, ROUTINE W REFLEX MICROSCOPIC
Bilirubin Urine: NEGATIVE
Glucose, UA: NEGATIVE mg/dL
Hgb urine dipstick: NEGATIVE
Ketones, ur: NEGATIVE mg/dL
Leukocytes,Ua: NEGATIVE
Nitrite: NEGATIVE
Protein, ur: NEGATIVE mg/dL
Specific Gravity, Urine: 1.006 (ref 1.005–1.030)
pH: 6 (ref 5.0–8.0)

## 2020-07-22 LAB — WET PREP, GENITAL
Clue Cells Wet Prep HPF POC: NONE SEEN
Sperm: NONE SEEN
Yeast Wet Prep HPF POC: NONE SEEN

## 2020-07-22 LAB — PREGNANCY, URINE: Preg Test, Ur: NEGATIVE

## 2020-07-22 LAB — GC/CHLAMYDIA PROBE AMP (~~LOC~~) NOT AT ARMC
Chlamydia: NEGATIVE
Comment: NEGATIVE
Comment: NORMAL
Neisseria Gonorrhea: NEGATIVE

## 2020-07-22 MED ORDER — METRONIDAZOLE 500 MG PO TABS
2000.0000 mg | ORAL_TABLET | Freq: Once | ORAL | Status: AC
  Administered 2020-07-22: 2000 mg via ORAL
  Filled 2020-07-22: qty 4

## 2020-07-22 MED ORDER — STERILE WATER FOR INJECTION IJ SOLN
INTRAMUSCULAR | Status: AC
  Filled 2020-07-22: qty 10

## 2020-07-22 MED ORDER — DOXYCYCLINE HYCLATE 100 MG PO CAPS: 100.0000 mg | ORAL_CAPSULE | Freq: Two times a day (BID) | ORAL | 0 refills | Status: DC

## 2020-07-22 MED ORDER — DOXYCYCLINE HYCLATE 100 MG PO TABS
100.0000 mg | ORAL_TABLET | Freq: Once | ORAL | Status: AC
  Administered 2020-07-22: 100 mg via ORAL
  Filled 2020-07-22: qty 1

## 2020-07-22 MED ORDER — ONDANSETRON 4 MG PO TBDP
4.0000 mg | ORAL_TABLET | Freq: Once | ORAL | Status: AC
  Administered 2020-07-22: 4 mg via ORAL
  Filled 2020-07-22: qty 1

## 2020-07-22 MED ORDER — CEFTRIAXONE SODIUM 1 G IJ SOLR
500.0000 mg | Freq: Once | INTRAMUSCULAR | Status: AC
  Administered 2020-07-22: 500 mg via INTRAMUSCULAR
  Filled 2020-07-22: qty 10

## 2020-07-22 MED ORDER — OXYCODONE-ACETAMINOPHEN 5-325 MG PO TABS
1.0000 | ORAL_TABLET | Freq: Once | ORAL | Status: AC
  Administered 2020-07-22: 1 via ORAL
  Filled 2020-07-22: qty 1

## 2020-07-22 NOTE — ED Triage Notes (Signed)
Pt states she went to the restroom around 11p and felt something hanging out of her vagina that is still there, reports pain and urinary urgency since. States she had an US done earlier today for her PCOS

## 2020-07-22 NOTE — Discharge Instructions (Signed)
Please read and follow all provided instructions.  Your diagnoses today include:  1. Vaginal discharge   2. Trichomoniasis   3. Pelvic pain     Tests performed today include: Urine test to check for infection - no infection Pregnancy test - was negative  Wet prep - shows signs of trichomonas infection  Test for gonorrhea and chlamydia -pending  Vital signs. See below for your results today.   Medications prescribed:   Doxycycline - antibiotic  You have been prescribed an antibiotic medicine: take the entire course of medicine even if you are feeling better. Stopping early can cause the antibiotic not to work.  Take any prescribed medications only as directed.  Home care instructions:  Follow any educational materials contained in this packet.  BE VERY CAREFUL not to take multiple medicines containing Tylenol (also called acetaminophen). Doing so can lead to an overdose which can damage your liver and cause liver failure and possibly death.   Follow-up instructions: Please follow-up with your gynecologist in the next 1 week.   Return instructions:   Please return to the Emergency Department if you experience worsening symptoms.   Please return if you have any other emergent concerns.  Additional Information:  Your vital signs today were: BP 115/80   Pulse 73   Temp 98.1 F (36.7 C) (Oral)   Resp 17   Ht 5\' 1"  (1.549 m)   Wt 69.9 kg   LMP 06/14/2020 (Within Days)   SpO2 100%   BMI 29.10 kg/m  If your blood pressure (BP) was elevated above 135/85 this visit, please have this repeated by your doctor within one month. --------------

## 2020-07-22 NOTE — ED Provider Notes (Signed)
[facility] DEPT Provider Note   CSN: [id number] Arrival date & time: 07/21/20  2343     History Chief Complaint  Patient presents with  . Bladder Prolapse    Robin Horn is a 35 y.o. female.  Patient presents today over concerns of bladder prolapse.  Patient states that she went to the restroom and urinated around 11 PM and felt that there was a balloon hanging out of her vagina.  She had some discomfort and urinary urgency.  No dysuria.  No constipation or diarrhea.  No abdominal pain.  Patient had an ultrasound done earlier today that she states was of her kidneys, due to history of PCOS, and her bladder.  She does not have a history of bladder or vaginal prolapse.  No history of surgeries.        Past Medical History:  Diagnosis Date  . Abnormal Pap smear of cervix   . Anxiety   . Chlamydia   . Depression   . Gestational diabetes 2018  . Gonorrhea   . Herpes genitalia   . Migraines   . Ovarian cyst   . Polycystic kidney disease   . Preeclampsia   . Pregnancy induced hypertension     Patient Active Problem List   Diagnosis Date Noted  . Encounter for female sterilization procedure 09/24/2016  . Positive GBS test 09/23/2016  . Supervision of other high risk pregnancy, antepartum 09/20/2016  . Pregnancy affected by fetal growth restriction 09/20/2016  . GDM (gestational diabetes mellitus) 09/11/2016  . Gestational hypertension without significant proteinuria 08/29/2016  . History of postpartum hemorrhage, currently pregnant 05/25/2016  . Drug use affecting pregnancy, antepartum 05/25/2016  . Bipolar 1 disorder ([place]) 05/25/2016  . Insomnia 05/25/2016  . Monochorionic diamniotic twin gestation in third trimester 04/25/2016    Past Surgical History:  Procedure Laterality Date  . COLPOSCOPY    . DILATION AND CURETTAGE OF UTERUS    . TONSILLECTOMY    . TUBAL LIGATION Bilateral 09/24/2016   Procedure: POST PARTUM TUBAL LIGATION;   Surgeon: Lavonia Drafts, MD;  Location: [facility];  Service: Gynecology;  Laterality: Bilateral;     OB History    Gravida  8   Para  5   Term  4   Preterm  1   AB  3   Living  6     SAB  0   IAB  3   Ectopic  0   Multiple  1   Live Births  6           Family History  Problem Relation Age of Onset  . Kidney disease Mother   . Cancer Paternal Grandmother   . Other Neg Hx     Social History   Tobacco Use  . Smoking status: Light Tobacco Smoker    Packs/day: 0.25    Types: Cigarettes  . Smokeless tobacco: Never Used  Vaping Use  . Vaping Use: Never used  Substance Use Topics  . Alcohol use: Yes    Alcohol/week: 1.0 standard drink    Types: 1 Glasses of wine per week    Comment: Once every 2 months. Nothing since finding out about pregnancy.   . Drug use: No    Types: Cocaine    Comment: none since +preg    Home Medications Prior to Admission medications   Medication Sig Start Date End Date Taking? Authorizing Provider  ARIPiprazole (ABILIFY PO) Take 1 tablet by mouth daily.  Yes [provider]  LISINOPRIL PO Take 1 tablet by mouth daily.   Yes [provider]  medroxyPROGESTERone (DEPO-PROVERA) 150 MG/ML injection Inject 150 mg into the muscle every 3 (three) months.   Yes [provider]  QUEtiapine (SEROQUEL) 100 MG tablet Take 100 mg by mouth at bedtime.   Yes [provider]  loperamide (IMODIUM) 2 MG capsule Take 1 capsule (2 mg total) by mouth 4 (four) times daily as needed for diarrhea or loose stools. Patient not taking: Reported on 07/22/2020 11/23/18   Hayden Rasmussen, MD  naproxen (NAPROSYN) 500 MG tablet Take 1 tablet (500 mg total) by mouth 2 (two) times daily. Patient not taking: Reported on 07/22/2020 11/23/18   Hayden Rasmussen, MD  ondansetron (ZOFRAN ODT) 4 MG disintegrating tablet Take 1 tablet (4 mg total) by mouth every 8 (eight) hours as needed for nausea or  vomiting. Patient not taking: Reported on 07/22/2020 11/23/18   Hayden Rasmussen, MD    Allergies    Lactose intolerance (gi)  Review of Systems   Review of Systems  Constitutional: Negative for fever.  HENT: Negative for rhinorrhea and sore throat.   Eyes: Negative for redness.  Respiratory: Negative for cough.   Cardiovascular: Negative for chest pain.  Gastrointestinal: Negative for abdominal pain, diarrhea, nausea and vomiting.  Genitourinary: Positive for pelvic pain and urgency. Negative for dysuria, frequency and hematuria.  Musculoskeletal: Negative for myalgias.  Skin: Negative for rash.  Neurological: Negative for headaches.    Physical Exam Updated Vital Signs BP 130/79   Pulse 75   Temp 98.1 F (36.7 C) (Oral)   Resp 16   Ht 5\' 1"  (1.549 m)   Wt 69.9 kg   LMP 06/14/2020 (Within Days)   SpO2 100%   BMI 29.10 kg/m   Physical Exam Vitals and nursing note reviewed. Exam conducted with a chaperone present.  Constitutional:      General: She is not in acute distress.    Appearance: She is well-developed.  HENT:     Head: Normocephalic and atraumatic.     Right Ear: External ear normal.     Left Ear: External ear normal.     Nose: Nose normal.  Eyes:     Conjunctiva/sclera: Conjunctivae normal.  Cardiovascular:     Rate and Rhythm: Normal rate and regular rhythm.     Heart sounds: No murmur heard.   Pulmonary:     Effort: No respiratory distress.     Breath sounds: No wheezing, rhonchi or rales.  Abdominal:     Palpations: Abdomen is soft.     Tenderness: There is no abdominal tenderness. There is no guarding or rebound.  Genitourinary:    Exam position: Lithotomy position.     Pubic Area: No rash.      Labia:        Right: No rash or lesion.        Left: No rash or lesion.      Vagina: No tenderness or prolapsed vaginal walls.     Cervix: Discharge and friability present. No cervical motion tenderness or erythema.     Uterus: No uterine prolapse.       Adnexa:        Right: Tenderness present.        Left: No tenderness.       Comments: No vaginal prolapse or cystocele at time of exam.  There is discharge noted. Musculoskeletal:     Cervical back: Normal  range of motion and neck supple.     Right lower leg: No edema.     Left lower leg: No edema.  Skin:    General: Skin is warm and dry.     Findings: No rash.  Neurological:     General: No focal deficit present.     Mental Status: She is alert. Mental status is at baseline.     Motor: No weakness.  Psychiatric:        Mood and Affect: Mood normal.     ED Results / Procedures / Treatments   Labs (all labs ordered are listed, but only abnormal results are displayed) Labs Reviewed  WET PREP, GENITAL  URINALYSIS, ROUTINE W REFLEX MICROSCOPIC  PREGNANCY, URINE  GC/CHLAMYDIA PROBE AMP ([place]) NOT AT [facility]    EKG None  Radiology No results found.  Procedures Procedures   Medications Ordered in ED Medications - No data to display  ED Course  I have reviewed the triage vital signs and the nursing notes.  Pertinent labs & imaging results that were available during my care of the patient were reviewed by me and considered in my medical decision making (see chart for details).  Patient seen and examined. Work-up initiated. Discussed with patient -- will proceed with pelvic exam.   Vital signs reviewed and are as follows: BP 130/79   Pulse 75   Temp 98.1 F (36.7 C) (Oral)   Resp 16   Ht 5\' 1"  (1.549 m)   Wt 69.9 kg   LMP 06/14/2020 (Within Days)   SpO2 100%   BMI 29.10 kg/m   1:41 AM Pelvic exam performed with RN chaperone. Labs pending.   2:46 AM reviewed all results with patient including positive test for trichomonas.  Patient be treated with 2 g of metronidazole.  We discussed treatment for gonorrhea and chlamydia and she would like to proceed.  Patient will be given 500 mg of Rocephin IM and discharged home on a 2-week course of doxycycline, given  adnexal tenderness and possible pelvic inflammatory disease.  Discussed that at this point there are no obvious findings of cystocele or vaginal prolapse, however she will need follow-up with her gynecologist for this.  She verbalizes understanding agrees with plan.  Patient urged to return with worsening symptoms or other concerns. Patient verbalized understanding and agrees with plan.     MDM Rules/Calculators/A&P                          Patient with symptoms tonight which were concerning for pelvic organ prolapse, however at time of exam, no obvious cystocele or vaginal prolapse.  Patient does have mild right adnexal tenderness with associated vaginal discharge.  She was positive for trichomonas and was treated.  She will be given Rocephin and doxycycline to cover for PID.  She looks well, nontoxic, afebrile.  She follows with [facility] and has GYN follow-up through them.  UA without signs of infection, pregnancy negative.   Final Clinical Impression(s) / ED Diagnoses Final diagnoses:  Vaginal discharge  Trichomoniasis  Pelvic pain    Rx / DC Orders ED Discharge Orders         Ordered    doxycycline (VIBRAMYCIN) 100 MG capsule  2 times daily        07/22/20 0244           Carlisle Cater, PA-C 07/22/20 [place], [place], DO 07/22/20 [phone]

## 2020-07-30 DIAGNOSIS — Z419 Encounter for procedure for purposes other than remedying health state, unspecified: Secondary | ICD-10-CM | POA: Diagnosis not present

## 2020-08-04 DIAGNOSIS — N926 Irregular menstruation, unspecified: Secondary | ICD-10-CM | POA: Diagnosis not present

## 2020-08-04 DIAGNOSIS — F17210 Nicotine dependence, cigarettes, uncomplicated: Secondary | ICD-10-CM | POA: Diagnosis not present

## 2020-08-04 DIAGNOSIS — E559 Vitamin D deficiency, unspecified: Secondary | ICD-10-CM | POA: Diagnosis not present

## 2020-08-04 DIAGNOSIS — Z79899 Other long term (current) drug therapy: Secondary | ICD-10-CM | POA: Diagnosis not present

## 2020-08-04 DIAGNOSIS — G894 Chronic pain syndrome: Secondary | ICD-10-CM | POA: Diagnosis not present

## 2020-08-04 DIAGNOSIS — G43009 Migraine without aura, not intractable, without status migrainosus: Secondary | ICD-10-CM | POA: Diagnosis not present

## 2020-08-04 DIAGNOSIS — G4700 Insomnia, unspecified: Secondary | ICD-10-CM | POA: Diagnosis not present

## 2020-08-04 DIAGNOSIS — M129 Arthropathy, unspecified: Secondary | ICD-10-CM | POA: Diagnosis not present

## 2020-08-04 DIAGNOSIS — M545 Low back pain, unspecified: Secondary | ICD-10-CM | POA: Diagnosis not present

## 2020-08-04 DIAGNOSIS — G8929 Other chronic pain: Secondary | ICD-10-CM | POA: Diagnosis not present

## 2020-08-19 ENCOUNTER — Encounter: Payer: Medicaid Other | Admitting: Internal Medicine

## 2020-08-24 ENCOUNTER — Ambulatory Visit: Payer: Self-pay | Admitting: Obstetrics and Gynecology

## 2020-08-25 DIAGNOSIS — F17210 Nicotine dependence, cigarettes, uncomplicated: Secondary | ICD-10-CM | POA: Diagnosis not present

## 2020-08-25 DIAGNOSIS — Z79899 Other long term (current) drug therapy: Secondary | ICD-10-CM | POA: Diagnosis not present

## 2020-08-25 DIAGNOSIS — N926 Irregular menstruation, unspecified: Secondary | ICD-10-CM | POA: Diagnosis not present

## 2020-08-25 DIAGNOSIS — M545 Low back pain, unspecified: Secondary | ICD-10-CM | POA: Diagnosis not present

## 2020-08-25 DIAGNOSIS — G894 Chronic pain syndrome: Secondary | ICD-10-CM | POA: Diagnosis not present

## 2020-08-25 DIAGNOSIS — G4700 Insomnia, unspecified: Secondary | ICD-10-CM | POA: Diagnosis not present

## 2020-08-25 DIAGNOSIS — G43009 Migraine without aura, not intractable, without status migrainosus: Secondary | ICD-10-CM | POA: Diagnosis not present

## 2020-08-25 DIAGNOSIS — R030 Elevated blood-pressure reading, without diagnosis of hypertension: Secondary | ICD-10-CM | POA: Diagnosis not present

## 2020-08-25 DIAGNOSIS — G8929 Other chronic pain: Secondary | ICD-10-CM | POA: Diagnosis not present

## 2020-08-29 DIAGNOSIS — Z419 Encounter for procedure for purposes other than remedying health state, unspecified: Secondary | ICD-10-CM | POA: Diagnosis not present

## 2020-09-21 DIAGNOSIS — R030 Elevated blood-pressure reading, without diagnosis of hypertension: Secondary | ICD-10-CM | POA: Diagnosis not present

## 2020-09-21 DIAGNOSIS — Z79899 Other long term (current) drug therapy: Secondary | ICD-10-CM | POA: Diagnosis not present

## 2020-09-21 DIAGNOSIS — N926 Irregular menstruation, unspecified: Secondary | ICD-10-CM | POA: Diagnosis not present

## 2020-09-21 DIAGNOSIS — F17210 Nicotine dependence, cigarettes, uncomplicated: Secondary | ICD-10-CM | POA: Diagnosis not present

## 2020-09-21 DIAGNOSIS — G43009 Migraine without aura, not intractable, without status migrainosus: Secondary | ICD-10-CM | POA: Diagnosis not present

## 2020-09-21 DIAGNOSIS — Z760 Encounter for issue of repeat prescription: Secondary | ICD-10-CM | POA: Diagnosis not present

## 2020-09-21 DIAGNOSIS — M545 Low back pain, unspecified: Secondary | ICD-10-CM | POA: Diagnosis not present

## 2020-09-21 DIAGNOSIS — Z9114 Patient's other noncompliance with medication regimen: Secondary | ICD-10-CM | POA: Diagnosis not present

## 2020-09-29 DIAGNOSIS — Z419 Encounter for procedure for purposes other than remedying health state, unspecified: Secondary | ICD-10-CM | POA: Diagnosis not present

## 2020-10-22 DIAGNOSIS — M542 Cervicalgia: Secondary | ICD-10-CM | POA: Diagnosis not present

## 2020-10-22 DIAGNOSIS — R030 Elevated blood-pressure reading, without diagnosis of hypertension: Secondary | ICD-10-CM | POA: Diagnosis not present

## 2020-10-22 DIAGNOSIS — M546 Pain in thoracic spine: Secondary | ICD-10-CM | POA: Diagnosis not present

## 2020-10-22 DIAGNOSIS — G43009 Migraine without aura, not intractable, without status migrainosus: Secondary | ICD-10-CM | POA: Diagnosis not present

## 2020-10-22 DIAGNOSIS — Z79899 Other long term (current) drug therapy: Secondary | ICD-10-CM | POA: Diagnosis not present

## 2020-10-22 DIAGNOSIS — F17210 Nicotine dependence, cigarettes, uncomplicated: Secondary | ICD-10-CM | POA: Diagnosis not present

## 2020-10-22 DIAGNOSIS — M545 Low back pain, unspecified: Secondary | ICD-10-CM | POA: Diagnosis not present

## 2020-10-22 DIAGNOSIS — N926 Irregular menstruation, unspecified: Secondary | ICD-10-CM | POA: Diagnosis not present

## 2020-10-22 DIAGNOSIS — G8929 Other chronic pain: Secondary | ICD-10-CM | POA: Diagnosis not present

## 2020-10-22 DIAGNOSIS — G894 Chronic pain syndrome: Secondary | ICD-10-CM | POA: Diagnosis not present

## 2020-10-26 DIAGNOSIS — Z79899 Other long term (current) drug therapy: Secondary | ICD-10-CM | POA: Diagnosis not present

## 2020-10-29 DIAGNOSIS — Z419 Encounter for procedure for purposes other than remedying health state, unspecified: Secondary | ICD-10-CM | POA: Diagnosis not present

## 2020-11-15 ENCOUNTER — Encounter: Payer: Self-pay | Admitting: Internal Medicine

## 2020-11-15 ENCOUNTER — Other Ambulatory Visit: Payer: Self-pay

## 2020-11-15 ENCOUNTER — Ambulatory Visit (INDEPENDENT_AMBULATORY_CARE_PROVIDER_SITE_OTHER): Payer: Medicaid Other | Admitting: Internal Medicine

## 2020-11-15 VITALS — BP 134/93 | HR 89 | Temp 99.1°F | Ht 60.0 in | Wt 155.7 lb

## 2020-11-15 DIAGNOSIS — F319 Bipolar disorder, unspecified: Secondary | ICD-10-CM

## 2020-11-15 DIAGNOSIS — F419 Anxiety disorder, unspecified: Secondary | ICD-10-CM

## 2020-11-15 DIAGNOSIS — N6320 Unspecified lump in the left breast, unspecified quadrant: Secondary | ICD-10-CM | POA: Insufficient documentation

## 2020-11-15 DIAGNOSIS — I10 Essential (primary) hypertension: Secondary | ICD-10-CM

## 2020-11-15 DIAGNOSIS — G4700 Insomnia, unspecified: Secondary | ICD-10-CM | POA: Diagnosis not present

## 2020-11-15 DIAGNOSIS — R3989 Other symptoms and signs involving the genitourinary system: Secondary | ICD-10-CM | POA: Diagnosis not present

## 2020-11-15 DIAGNOSIS — F32A Depression, unspecified: Secondary | ICD-10-CM

## 2020-11-15 LAB — POCT URINALYSIS DIPSTICK
Bilirubin, UA: NEGATIVE
Blood, UA: NEGATIVE
Glucose, UA: NEGATIVE
Ketones, UA: NEGATIVE
Nitrite, UA: NEGATIVE
Protein, UA: NEGATIVE
Spec Grav, UA: <=1.005 — AB (ref 1.010–1.025)
Urobilinogen, UA: 0.2 E.U./dL
pH, UA: 5.5 (ref 5.0–8.0)

## 2020-11-15 NOTE — Progress Notes (Signed)
Patient ID: Robin Horn, female   DOB: 1985-07-14, 35 y.o.   MRN: [id number]   CC: L breast lump  HPI:  [name] is a 35 y.o. female with past medical history as listed below who presents today with a painful lump in her L breast for the past week. She states that she initially noted the presence of the lump in her L arm pit around one month ago and at this time it was nontender. It has since moved to the breast and is tender to palpation, rated a 7/10. She states that it has always been hard and mobile. She has not noticed any lumps in her R arm pit or breast.  She denies any recent injuries to the L arm. She has not had any cat bites or scratches or other insect bites that she is aware of. She denies family history of breast cancer. She is not breastfeeding. She has not had a menstrual cycle in around 6 months due to her depo-provera shots, the last of which was around one month ago.  She does endorse that she would like better control over her anxiety and depression, and has been having trouble sleeping over the past few weeks.  She reports having been told in the past that she has polycystic kidney disease though at this time is not seen by a nephrologist or treated for nephrology-related conditions.  She currently smokes about 1 pack per day.  Past Medical History:  Diagnosis Date   Abnormal Pap smear of cervix    Anxiety    Chlamydia    Depression    Gestational diabetes 2018   Gonorrhea    Herpes genitalia    Migraines    Ovarian cyst    Polycystic kidney disease    Preeclampsia    Pregnancy induced hypertension    Review of Systems:  Review of Systems  Constitutional:  Positive for malaise/fatigue. Negative for chills, fever and weight loss.  Respiratory:  Negative for shortness of breath.   Cardiovascular:  Negative for chest pain.  Gastrointestinal:  Negative for constipation and diarrhea.  Genitourinary:  Positive for flank pain. Negative for dysuria,  frequency and urgency.  Neurological:  Positive for weakness. Negative for headaches.  Psychiatric/Behavioral:  Positive for depression. The patient is nervous/anxious and has insomnia.     Physical Exam:  Vitals:   11/15/20 0916 11/15/20 0917  BP: (!) 134/93   Pulse: 89   Temp: 99.1 F (37.3 C)   TempSrc: Oral   SpO2: 100%   Weight: 155 lb 11.2 oz (70.6 kg)   Height:  5' (1.524 m)   Physical Exam Exam conducted with a chaperone present.  Constitutional:      Appearance: Normal appearance.  Cardiovascular:     Rate and Rhythm: Normal rate and regular rhythm.     Heart sounds: Normal heart sounds.  Pulmonary:     Effort: Pulmonary effort is normal.     Breath sounds: Normal breath sounds.  Chest:  Breasts:    Right: Normal. No axillary adenopathy or supraclavicular adenopathy.     Left: Mass and tenderness present. No inverted nipple, nipple discharge, skin change, axillary adenopathy or supraclavicular adenopathy.    Abdominal:     General: Abdomen is flat.     Tenderness: There is no abdominal tenderness.  Musculoskeletal:     Right lower leg: No edema.     Left lower leg: No edema.  Lymphadenopathy:     Upper Body:  Right upper body: No supraclavicular, axillary or pectoral adenopathy.     Left upper body: No supraclavicular, axillary or pectoral adenopathy.  Psychiatric:        Mood and Affect: Mood normal. Affect is not flat.        Behavior: Behavior normal. Behavior is cooperative.     Assessment & Plan:   See Encounters Tab for problem based charting.  Patient seen with Dr. Jimmye Norman

## 2020-11-15 NOTE — Assessment & Plan Note (Addendum)
Patient presents with L breast lump for the past week that is hard, mobile, and tender, that she states originated in the L arm pit. On physical exam other lumps were felt in addition to the lump she complains of.  Differential includes fibrocystic changes vs. Malignancy. - Diagnostic mammogram ordered - Will make decisions for management per mammogram findings

## 2020-11-15 NOTE — Assessment & Plan Note (Deleted)
Patient endorses insomnia recently as she has been without Ambien for a few weeks now.

## 2020-11-15 NOTE — Assessment & Plan Note (Signed)
Patient takes abilify and seroquel. She does endorse continued concerns with anxiety and depression and would like a referral to behavioral health for further management.

## 2020-11-15 NOTE — Patient Instructions (Addendum)
Thank you for visiting the Internal Medicine Clinic today. It was a pleasure to meet you!  Today we discussed your concern of a lump in your left breast. I have placed an order for a mammogram so that we can get a better idea of what may be causing [place] to feel this lump. You will receive a call to schedule this and I will let you know what these results show once I receive them.  I am also placing a referral for behavioral health for your anxiety and depression.  If you have any questions or concerns, please call our clinic at [phone] between 9am-5pm and after hours call [phone] and ask for the internal medicine resident on call. If you feel you are having a medical emergency please call 911.  Dr. Marlou Sa

## 2020-11-15 NOTE — Assessment & Plan Note (Addendum)
Patient reports history of polycystic kidney disease. She complains today of bilateral flank pain with CVA tenderness. UA completed to rule out UTI which was not concerning for infection.

## 2020-11-15 NOTE — Assessment & Plan Note (Deleted)
Patient has remained hypertensive since her last delivery in 2018. She is currently taking lisinopril for management. BP in clinic today was 134/93.

## 2020-11-15 NOTE — Assessment & Plan Note (Signed)
Patient was diagnosed with gestational hypertension and has been medically managed since her last delivery in 2018. She is currently taking lisinopril for management. BP in clinic today was 134/93.

## 2020-11-16 ENCOUNTER — Other Ambulatory Visit: Payer: Self-pay | Admitting: Internal Medicine

## 2020-11-16 DIAGNOSIS — N6320 Unspecified lump in the left breast, unspecified quadrant: Secondary | ICD-10-CM

## 2020-11-18 ENCOUNTER — Encounter: Payer: Self-pay | Admitting: Internal Medicine

## 2020-11-20 DIAGNOSIS — G4700 Insomnia, unspecified: Secondary | ICD-10-CM | POA: Diagnosis not present

## 2020-11-20 DIAGNOSIS — Z79899 Other long term (current) drug therapy: Secondary | ICD-10-CM | POA: Diagnosis not present

## 2020-11-20 DIAGNOSIS — G894 Chronic pain syndrome: Secondary | ICD-10-CM | POA: Diagnosis not present

## 2020-11-29 DIAGNOSIS — Z419 Encounter for procedure for purposes other than remedying health state, unspecified: Secondary | ICD-10-CM | POA: Diagnosis not present

## 2020-12-06 ENCOUNTER — Ambulatory Visit: Payer: Medicaid Other | Admitting: Behavioral Health

## 2020-12-06 DIAGNOSIS — F331 Major depressive disorder, recurrent, moderate: Secondary | ICD-10-CM

## 2020-12-06 DIAGNOSIS — F419 Anxiety disorder, unspecified: Secondary | ICD-10-CM

## 2020-12-06 NOTE — BH Specialist Note (Signed)
Integrated Behavioral Health via Telemedicine Visit  12/06/2020 Robin Horn JI:[id number]  Number of [place] visits: 1/6 Session Start time: 2:00pm  Session End time: 2:40pm Total time: 40   Referring Provider: Dr. [name], DO Patient/Family location: Pt is home in private; Pt stays w/her Mother since loss of Apt happened a few months ago [facility] Provider location: [facility] Office All persons participating in visit: Pt & Clinician Types of Service: Individual psychotherapy  I connected with [name]'s  self  via  Telephone or Geologist, engineering  (Video is Tree surgeon) and verified that I am speaking with the correct person using two identifiers. Discussed confidentiality: Yes   I discussed the limitations of telemedicine and the availability of in person appointments.  Discussed there is a possibility of technology failure and discussed alternative modes of communication if that failure occurs.  I discussed that engaging in this telemedicine visit, they consent to the provision of behavioral healthcare and the services will be billed under their insurance.  Patient and/or legal guardian expressed understanding and consented to Telemedicine visit: Yes   Presenting Concerns: Patient and/or family reports the following symptoms/concerns: elevated anx/dep & difficulty sleeping. Pt is trying to secure housing for herself & 6 children. Currently, her 2 Sons stay w/their Father.  Duration of problem: months; Severity of problem: moderate  Patient and/or Family's Strengths/Protective Factors: Social and Emotional competence, Concrete supports in place (healthy food, safe environments, etc.), and Sense of purpose  Goals Addressed: Patient will:  Reduce symptoms of: anxiety, depression, and stress   Increase knowledge and/or ability of: coping skills, healthy habits, and stress reduction   Demonstrate ability to:  Increase healthy adjustment to current life circumstances and Increase adequate support systems for patient/family  Progress towards Goals: Estb'd today; Pt will attend psychotherapy for enough sessions to achieve support for her situation.  Interventions: Interventions utilized:  Motivational Interviewing, Behavioral Activation, and Supportive Counseling Standardized Assessments completed:  screeners prn  Patient and/or Family Response: Pt is receptive to visit today & requests future sessions for support.  Assessment: Patient currently experiencing elevated anx/dep due to her losing her housing in the past few months. Her belongings, furniture, & possessions of her children have all been lost. She does not currently have transportation.   Pt has 18yo Robin Horn Son, 52yo Robin Horn, & 4yo Twins Robin Horn & Robin Horn.  Patient may benefit from cont'd support for her mental health wellness.  Plan: Follow up with behavioral health clinician on : 2-3 wks for 30 min ck-in Behavioral recommendations: Call your Pain Mgmt Provider to reconcile medications. Pt has d/c'd Duloxitine '60mg'$ -it makes her feel bad. Referral(s): [facility] (In Clinic)  I discussed the assessment and treatment plan with the patient and/or parent/guardian. They were provided an opportunity to ask questions and all were answered. They agreed with the plan and demonstrated an understanding of the instructions.   They were advised to call back or seek an in-person evaluation if the symptoms worsen or if the condition fails to improve as anticipated.  Donnetta Hutching, LMFT

## 2020-12-15 ENCOUNTER — Other Ambulatory Visit: Payer: Self-pay

## 2020-12-15 ENCOUNTER — Ambulatory Visit
Admission: RE | Admit: 2020-12-15 | Discharge: 2020-12-15 | Disposition: A | Discharge status: Home / Self Care | Payer: Medicaid Other | Source: Ambulatory Visit | Attending: Internal Medicine | Admitting: Internal Medicine

## 2020-12-15 ENCOUNTER — Other Ambulatory Visit: Payer: Self-pay | Admitting: Internal Medicine

## 2020-12-15 DIAGNOSIS — R599 Enlarged lymph nodes, unspecified: Secondary | ICD-10-CM

## 2020-12-15 DIAGNOSIS — N6320 Unspecified lump in the left breast, unspecified quadrant: Secondary | ICD-10-CM

## 2020-12-15 NOTE — Progress Notes (Signed)
Internal Medicine Clinic Attending  I saw and evaluated the patient.  I personally confirmed the key portions of the history and exam documented by Dr.  Dean  and I reviewed pertinent patient test results.  The assessment, diagnosis, and plan were formulated together and I agree with the documentation in the resident's note.  

## 2020-12-17 ENCOUNTER — Other Ambulatory Visit: Payer: Self-pay | Admitting: Internal Medicine

## 2020-12-17 ENCOUNTER — Other Ambulatory Visit: Payer: Self-pay

## 2020-12-17 ENCOUNTER — Ambulatory Visit
Admission: RE | Admit: 2020-12-17 | Discharge: 2020-12-17 | Disposition: A | Discharge status: Home / Self Care | Payer: Medicaid Other | Source: Ambulatory Visit | Attending: Internal Medicine | Admitting: Internal Medicine

## 2020-12-17 DIAGNOSIS — N6320 Unspecified lump in the left breast, unspecified quadrant: Secondary | ICD-10-CM

## 2020-12-17 DIAGNOSIS — R599 Enlarged lymph nodes, unspecified: Secondary | ICD-10-CM

## 2020-12-20 DIAGNOSIS — F17210 Nicotine dependence, cigarettes, uncomplicated: Secondary | ICD-10-CM | POA: Diagnosis not present

## 2020-12-20 DIAGNOSIS — G4700 Insomnia, unspecified: Secondary | ICD-10-CM | POA: Diagnosis not present

## 2020-12-20 DIAGNOSIS — Z79899 Other long term (current) drug therapy: Secondary | ICD-10-CM | POA: Diagnosis not present

## 2020-12-20 DIAGNOSIS — G894 Chronic pain syndrome: Secondary | ICD-10-CM | POA: Diagnosis not present

## 2020-12-22 NOTE — Progress Notes (Signed)
[facility] NOTE  Patient Care Team: [name], DO as PCP - General (Internal Medicine) Claudia Pollock, RN as Registered Nurse Dayna Barker, MD as Consulting Physician (General Surgery) Mauro Kaufmann, RN as Oncology Nurse Navigator Rockwell Germany, RN as Oncology Nurse Navigator  CHIEF COMPLAINTS/PURPOSE OF CONSULTATION:  Newly diagnosed left breast cancer  HISTORY OF PRESENTING ILLNESS:  Robin Horn 35 y.o. 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 She palpated a lump in the upper outer left breast. Diagnostic mammogram and [place] on 12/15/20 showed multiple suspicious masses in the upper outer left breast, 2 abnormal lymph nodes with increased cortical thickness, 1 lymph node with borderline abnormal cortical thickness, and no mammographic evidence of malignancy in the right breast. Biopsy on 12/17/20 showed invasive ductal carcinoma and DCIS, Her2+ (3+)/ER+ (80%)/PR- (<1%) in the left breast, and invasive ductal carcinoma, Her2+ (3+)/ER+ (80%)/PR+ (3%) in the left axillary lymph node. She presents to the clinic today for initial evaluation and discussion of treatment options.   I reviewed her records extensively and collaborated the history with the patient.  SUMMARY OF ONCOLOGIC HISTORY: Oncology History  Left breast lump (Resolved)  Malignant neoplasm of upper-outer quadrant of left breast in female, estrogen receptor positive ([place])  12/17/2020 Initial Diagnosis   Palpable lump in the upper outer left breast. 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, Her2+ (3+)/ER+ (80%)/PR- (<1%) in the left breast, and invasive ductal carcinoma, Her2+ (3+)/ER+ (80%)/PR+ (3%) in the left axillary lymph  node   12/23/2020 Cancer Staging   Staging form: Breast, AJCC 8th Edition - Clinical stage from 12/23/2020: Stage IB (cT1c, cN1, cM0, G3, ER+, PR+, HER2+) - Signed by [name], MD on 12/23/2020 Stage prefix: Initial diagnosis Histologic grading system: 3 grade system     MEDICAL HISTORY:  Past Medical History:  Diagnosis Date   Abnormal Pap smear of cervix    Anxiety    Chlamydia    Depression    Gestational diabetes 2018   Gonorrhea    Herpes genitalia    Migraines    Ovarian cyst    Polycystic kidney disease    Preeclampsia    Pregnancy induced hypertension     SURGICAL HISTORY: Past Surgical History:  Procedure Laterality Date   COLPOSCOPY     DILATION AND CURETTAGE OF UTERUS     TONSILLECTOMY     TUBAL LIGATION Bilateral 09/24/2016   Procedure: POST PARTUM TUBAL LIGATION;  Surgeon: Lavonia Drafts, MD;  Location: [facility];  Service: Gynecology;  Laterality: Bilateral;    SOCIAL HISTORY: Social History   Socioeconomic History   Marital status: Married    Spouse name: Not on file   Number of children: Not on file   Years of education: Not on file   Highest education level: Not on file  Occupational History   Not on file  Tobacco Use   Smoking status: Light Smoker    Packs/day: 1.00    Types: Cigarettes   Smokeless tobacco: Never   Tobacco comments:    Wants to restart Nicotine  Vaping Use   Vaping Use: Never used  Substance and Sexual Activity   Alcohol use: Yes    Alcohol/week: 1.0 standard drink    Types: 1 Glasses of  wine per week    Comment: Once every 2 months. Nothing since finding out about pregnancy.    Drug use: No    Types: Cocaine    Comment: none since +preg   Sexual activity: Yes    Birth control/protection: None, Surgical    Comment: Tubal  Other Topics Concern   Not on file  Social History Narrative   Not on file   Social Determinants of Health   Financial Resource Strain: Not on file  Food Insecurity: Not  on file  Transportation Needs: Not on file  Physical Activity: Not on file  Stress: Not on file  Social Connections: Not on file  Intimate Partner Violence: Not on file    FAMILY HISTORY: Family History  Problem Relation Age of Onset   Kidney disease Mother    Cancer Paternal Grandmother    Other Neg Hx     ALLERGIES:  is allergic to lactose intolerance (gi).  MEDICATIONS:  Current Outpatient Medications  Medication Sig Dispense Refill   ARIPiprazole (ABILIFY PO) Take 1 tablet by mouth daily.     doxycycline (VIBRAMYCIN) 100 MG capsule Take 1 capsule (100 mg total) by mouth 2 (two) times daily. 28 capsule 0   LISINOPRIL PO Take 1 tablet by mouth daily.     medroxyPROGESTERone (DEPO-PROVERA) 150 MG/ML injection Inject 150 mg into the muscle every 3 (three) months.     QUEtiapine (SEROQUEL) 100 MG tablet Take 100 mg by mouth at bedtime.     No current facility-administered medications for this visit.    REVIEW OF SYSTEMS:   Constitutional: Denies fevers, chills or abnormal night sweats Eyes: Denies blurriness of vision, double vision or watery eyes Ears, nose, mouth, throat, and face: Denies mucositis or sore throat Respiratory: Denies cough, dyspnea or wheezes Cardiovascular: Denies palpitation, chest discomfort or lower extremity swelling Gastrointestinal:  Denies nausea, heartburn or change in bowel habits Skin: Denies abnormal skin rashes Lymphatics: Denies new lymphadenopathy or easy bruising Neurological:Denies numbness, tingling or new weaknesses Behavioral/Psych: Mood is stable, no new changes  Breast: Palpable lump in the left breast All other systems were reviewed with the patient and are negative.  PHYSICAL EXAMINATION: ECOG PERFORMANCE STATUS: 1 - Symptomatic but completely ambulatory  Vitals:   12/23/20 1549  BP: (!) 181/102  Pulse: 86  Resp: 18  Temp: (!) 97.1 F (36.2 C)  SpO2: 100%   Filed Weights   12/23/20 1549  Weight: 160 lb 1.6 oz (72.6 kg)       LABORATORY DATA:  I have reviewed the data as listed Lab Results  Component Value Date   WBC 7.6 09/18/2018   HGB 12.4 09/18/2018   HCT 38.5 09/18/2018   MCV 88.7 09/18/2018   PLT 329 09/18/2018   Lab Results  Component Value Date   NA 139 09/18/2018   K 3.5 09/18/2018   CL 102 09/18/2018   CO2 26 09/18/2018    RADIOGRAPHIC STUDIES: I have personally reviewed the radiological reports and agreed with the findings in the report.  ASSESSMENT AND PLAN:  Malignant neoplasm of upper-outer quadrant of left breast in female, estrogen receptor positive ([place]) 12/17/2020: Palpable lump in the upper outer left breast. 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, Her2+ (3+)/ER+ (80%)/PR- (<1%) in the left breast, and invasive ductal carcinoma, Her2+ (3+)/ER+ (80%)/PR+ (3%) in  the left axillary lymph node T1CN1A stage Ib  Pathology and radiology counseling: Discussed with the patient, the details of pathology including the type of breast cancer,the clinical staging, the significance of ER, PR and HER-2/neu receptors and the implications for treatment. After reviewing the pathology in detail, we proceeded to discuss the different treatment options between surgery, radiation, chemotherapy, antiestrogen therapies.  Recommendation based on multidisciplinary tumor board: 1. Neoadjuvant chemotherapy with TCH Perjeta 6 cycles followed by Herceptin Perjeta maintenance versus Kadcyla maintenance (based on response to neoadjuvant chemo) for 1 year 2. Followed by breast conserving surgery versus mastectomy if possible with targeted node dissection 3. Followed by adjuvant radiation therapy  4.  Followed by adjuvant antiestrogen therapy  Chemotherapy Counseling: I discussed the risks and benefits  of chemotherapy including the risks of nausea/ vomiting, risk of infection from low WBC count, fatigue due to chemo or anemia, bruising or bleeding due to low platelets, mouth sores, loss/ change in taste and decreased appetite. Liver and kidney function will be monitored through out chemotherapy as abnormalities in liver and kidney function may be a side effect of treatment. Cardiac dysfunction due to Herceptin and Perjeta and neuropathy risk from Taxotere were discussed in detail. Risk of permanent bone marrow dysfunction due to chemo were also discussed.  Plan: 1. Port placement 2. Echocardiogram 3. Chemotherapy class 4. Breast MRI 5.  Recommended participation in nausea study  Return to clinic in 2 weeks to start chemotherapy.   All questions were answered. The patient knows to call the clinic with any problems, questions or concerns.   Rulon Eisenmenger, MD, MPH 12/23/2020    I, Thana Ates, am acting as scribe for [name], MD.  I have reviewed the above documentation for accuracy and completeness, and I agree with the above.

## 2020-12-23 ENCOUNTER — Inpatient Hospital Stay: Payer: Medicaid Other | Attending: Hematology and Oncology | Admitting: Hematology and Oncology

## 2020-12-23 ENCOUNTER — Other Ambulatory Visit: Payer: Self-pay

## 2020-12-23 VITALS — BP 181/102 | HR 86 | Temp 97.1°F | Resp 18 | Ht 60.0 in | Wt 160.1 lb

## 2020-12-23 DIAGNOSIS — Z170 Estrogen receptor positive status [ER+]: Secondary | ICD-10-CM | POA: Diagnosis not present

## 2020-12-23 DIAGNOSIS — Z720 Tobacco use: Secondary | ICD-10-CM | POA: Insufficient documentation

## 2020-12-23 DIAGNOSIS — C50412 Malignant neoplasm of upper-outer quadrant of left female breast: Secondary | ICD-10-CM | POA: Insufficient documentation

## 2020-12-23 DIAGNOSIS — Z79899 Other long term (current) drug therapy: Secondary | ICD-10-CM | POA: Diagnosis not present

## 2020-12-23 DIAGNOSIS — Z809 Family history of malignant neoplasm, unspecified: Secondary | ICD-10-CM

## 2020-12-23 MED ORDER — DEXAMETHASONE 4 MG PO TABS: 4.0000 mg | ORAL_TABLET | Freq: Every day | ORAL | 1 refills | Status: DC

## 2020-12-23 MED ORDER — PROCHLORPERAZINE MALEATE 10 MG PO TABS: 10.0000 mg | ORAL_TABLET | Freq: Four times a day (QID) | ORAL | 1 refills | Status: DC | PRN

## 2020-12-23 MED ORDER — ONDANSETRON HCL 8 MG PO TABS: 8.0000 mg | ORAL_TABLET | Freq: Two times a day (BID) | ORAL | 1 refills | Status: DC | PRN

## 2020-12-23 MED ORDER — LIDOCAINE-PRILOCAINE 2.5-2.5 % EX CREA: TOPICAL_CREAM | CUTANEOUS | 3 refills | Status: DC

## 2020-12-23 NOTE — Assessment & Plan Note (Signed)
12/17/2020: Palpable lump in the upper outer left breast. 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, Her2+ (3+)/ER+ (80%)/PR- (<1%) in the left breast, and invasive ductal carcinoma, Her2+ (3+)/ER+ (80%)/PR+ (3%) in the left axillary lymph node T1CN1A stage Ib  Pathology and radiology counseling: Discussed with the patient, the details of pathology including the type of breast cancer,the clinical staging, the significance of ER, PR and HER-2/neu receptors and the implications for treatment. After reviewing the pathology in detail, we proceeded to discuss the different treatment options between surgery, radiation, chemotherapy, antiestrogen therapies.  Recommendation based on multidisciplinary tumor board: 1. Neoadjuvant chemotherapy with TCH Perjeta 6 cycles followed by Herceptin Perjeta maintenance versus Kadcyla maintenance (based on response to neoadjuvant chemo) for 1 year 2. Followed by breast conserving surgery versus mastectomy if possible with targeted node dissection 3. Followed by adjuvant radiation therapy  4.  Followed by adjuvant antiestrogen therapy  Chemotherapy Counseling: I discussed the risks and benefits of chemotherapy including the risks of nausea/ vomiting, risk of infection from low WBC count, fatigue due to chemo or anemia, bruising or bleeding due to low platelets, mouth sores, loss/ change in taste and decreased appetite. Liver and kidney function will be monitored through out chemotherapy as abnormalities in liver and kidney function may be a side effect of treatment. Cardiac dysfunction due to Herceptin and Perjeta and neuropathy risk from Taxotere were discussed in detail. Risk of permanent bone marrow dysfunction due to chemo were also discussed.  Plan: 1. Port  placement 2. Echocardiogram 3. Chemotherapy class 4. Breast MRI 5.  Recommended participation in nausea study  Return to clinic in 2 weeks to start chemotherapy.

## 2020-12-23 NOTE — Progress Notes (Signed)
START ON PATHWAY REGIMEN - Breast     A cycle is every 21 days:     Pertuzumab      Pertuzumab      Trastuzumab-xxxx      Trastuzumab-xxxx      Carboplatin      Docetaxel   **Always confirm dose/schedule in your pharmacy ordering system**  Patient Characteristics: Preoperative or Nonsurgical Candidate (Clinical Staging), Neoadjuvant Therapy followed by Surgery, Invasive Disease, Chemotherapy, HER2 Positive, ER Positive Therapeutic Status: Preoperative or Nonsurgical Candidate (Clinical Staging) AJCC M Category: cM0 AJCC Grade: G3 Breast Surgical Plan: Neoadjuvant Therapy followed by Surgery ER Status: Positive (+) AJCC 8 Stage Grouping: IB HER2 Status: Positive (+) AJCC T Category: cT2 AJCC N Category: cN1 PR Status: Positive (+) Intent of Therapy: Curative Intent, Discussed with Patient 

## 2020-12-24 ENCOUNTER — Other Ambulatory Visit: Payer: Self-pay | Admitting: *Deleted

## 2020-12-24 ENCOUNTER — Other Ambulatory Visit: Payer: Self-pay | Admitting: General Surgery

## 2020-12-24 ENCOUNTER — Encounter: Payer: Self-pay | Admitting: *Deleted

## 2020-12-24 ENCOUNTER — Telehealth: Payer: Self-pay | Admitting: Hematology and Oncology

## 2020-12-24 ENCOUNTER — Telehealth: Payer: Self-pay | Admitting: *Deleted

## 2020-12-24 DIAGNOSIS — C50412 Malignant neoplasm of upper-outer quadrant of left female breast: Secondary | ICD-10-CM

## 2020-12-24 DIAGNOSIS — Z170 Estrogen receptor positive status [ER+]: Secondary | ICD-10-CM | POA: Diagnosis not present

## 2020-12-24 NOTE — Addendum Note (Signed)
Addended by: Sharlynn Oliphant A on: 12/24/2020 11:11 AM   Modules accepted: Orders

## 2020-12-24 NOTE — Telephone Encounter (Signed)
Scheduled appts per 8/26 sch msg. Pt aware. Will also have updated calendar printed for pt at next visit.

## 2020-12-24 NOTE — Telephone Encounter (Signed)
Referral faxed to [facility] °

## 2020-12-24 NOTE — Telephone Encounter (Signed)
Called pt to provide navigation resources and contact information. No questions asked at this time. Pt needed to rest after appointment with Dr. Donne Hazel.

## 2020-12-27 ENCOUNTER — Encounter: Payer: Self-pay | Admitting: *Deleted

## 2020-12-27 ENCOUNTER — Ambulatory Visit: Payer: Medicaid Other | Attending: Hematology and Oncology | Admitting: Physical Therapy

## 2020-12-27 ENCOUNTER — Encounter: Payer: Self-pay | Admitting: Licensed Clinical Social Worker

## 2020-12-27 ENCOUNTER — Telehealth: Payer: Self-pay | Admitting: *Deleted

## 2020-12-27 ENCOUNTER — Other Ambulatory Visit: Payer: Self-pay

## 2020-12-27 ENCOUNTER — Encounter: Payer: Self-pay | Admitting: Physical Therapy

## 2020-12-27 DIAGNOSIS — R293 Abnormal posture: Secondary | ICD-10-CM | POA: Insufficient documentation

## 2020-12-27 DIAGNOSIS — C50412 Malignant neoplasm of upper-outer quadrant of left female breast: Secondary | ICD-10-CM | POA: Insufficient documentation

## 2020-12-27 DIAGNOSIS — Z170 Estrogen receptor positive status [ER+]: Secondary | ICD-10-CM | POA: Diagnosis not present

## 2020-12-27 NOTE — Therapy (Signed)
[place], [place], [address] Phone: [phone]   Fax:  [phone]  Physical Therapy Evaluation  Patient Details  Name: ADAJA Horn MRN: [id number] Date of Birth: 09/27/1985 Referring Provider (PT): Dr. Rolm Bookbinder   Encounter Date: 12/27/2020   PT End of Session - 12/27/20 1634     Visit Number 1    Number of Visits 2    Date for PT Re-Evaluation 06/28/21    PT Start Time 1300    PT Stop Time 1344    PT Time Calculation (min) 44 min    Activity Tolerance Patient tolerated treatment well    Behavior During Therapy [facility] for tasks assessed/performed             Past Medical History:  Diagnosis Date   Abnormal Pap smear of cervix    Anxiety    Chlamydia    Depression    Gestational diabetes 2018   Gonorrhea    Herpes genitalia    Migraines    Ovarian cyst    Polycystic kidney disease    Preeclampsia    Pregnancy induced hypertension     Past Surgical History:  Procedure Laterality Date   COLPOSCOPY     DILATION AND CURETTAGE OF UTERUS     TONSILLECTOMY     TUBAL LIGATION Bilateral 09/24/2016   Procedure: POST PARTUM TUBAL LIGATION;  Surgeon: Lavonia Drafts, MD;  Location: [facility];  Service: Gynecology;  Laterality: Bilateral;    There were no vitals filed for this visit.    Subjective Assessment - 12/27/20 1625     Subjective Patient reports she is here today to be seen for a baseline assessment for her newly diagnosed left breast cancer.    Pertinent History Patient was diagnosed on 12/17/2020 with left grade III invasive ductal carcinoma breast cancer. There are 2 masses that measure 2.1 cm and 1.6 cm and are located in the upper outer quadrant. It is triple positive with a Ki67 of 25% with 2 known positive axillary lymph nodes.    Patient Stated Goals Reduce lymphedema risk and learn post op shoulder ROM HEP    Currently in Pain? Yes    Pain Score  5     Pain Location Back    Pain Orientation Lower    Pain Descriptors / Indicators Aching    Pain Type Chronic pain    Pain Onset More than a month ago    Pain Frequency Intermittent    Aggravating Factors  Sitting    Pain Relieving Factors Unknown                [facility] PT Assessment - 12/27/20 0001       Assessment   Medical Diagnosis Left breast cancer    Referring Provider (PT) Dr. Rolm Bookbinder    Onset Date/Surgical Date 12/17/20    Hand Dominance Right    Prior Therapy none      Precautions   Precautions Other (comment)    Precaution Comments active cancer      Restrictions   Weight Bearing Restrictions No      Balance Screen   Has the patient fallen in the past 6 months No    Has the patient had a decrease in activity level because of a fear of falling?  No    Is the patient reluctant to leave their home because of a fear of falling?  No      Home  Environment   Living Environment Private residence    Living Arrangements Children;Parent   Mom, 61 y.o. twins and a 39 y.o.   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 to her new diagnosis    Leisure She does not exercise      Cognition   Overall Cognitive Status Within Functional Limits for tasks assessed      Posture/Postural Control   Posture/Postural Control Postural limitations    Postural Limitations Forward head;Increased lumbar lordosis      ROM / Strength   AROM / PROM / Strength AROM;Strength      AROM   Overall AROM Comments Cervical AROM is WNL    AROM Assessment Site Shoulder    Right/Left Shoulder Right;Left    Right Shoulder Extension 43 Degrees    Right Shoulder Flexion 163 Degrees    Right Shoulder ABduction 164 Degrees    Right Shoulder Internal Rotation 55 Degrees    Right Shoulder External Rotation 80 Degrees    Left  Shoulder Extension 35 Degrees    Left Shoulder Flexion 153 Degrees    Left Shoulder ABduction 154 Degrees    Left Shoulder Internal Rotation 60 Degrees    Left Shoulder External Rotation 85 Degrees      Strength   Overall Strength Within functional limits for tasks performed               LYMPHEDEMA/ONCOLOGY QUESTIONNAIRE - 12/27/20 0001       Type   Cancer Type Left breast cancer      Lymphedema Assessments   Lymphedema Assessments Upper extremities      Right Upper Extremity Lymphedema   10 cm Proximal to Olecranon Process 31.6 cm    Olecranon Process 24.5 cm    10 cm Proximal to Ulnar Styloid Process 23.8 cm    Just Proximal to Ulnar Styloid Process 16.1 cm    Across Hand at [employer] 20.2 cm    At [place] of 2nd Digit 6.2 cm      Left Upper Extremity Lymphedema   10 cm Proximal to Olecranon Process 32.1 cm    Olecranon Process 24.6 cm    10 cm Proximal to Ulnar Styloid Process 23.9 cm    Just Proximal to Ulnar Styloid Process 16.4 cm    Across Hand at [employer] 19 cm    At [place] of 2nd Digit 6.1 cm             L-DEX FLOWSHEETS - 12/27/20 1600       L-DEX LYMPHEDEMA SCREENING   Measurement Type Unilateral    L-DEX MEASUREMENT EXTREMITY Upper Extremity    POSITION  Standing    DOMINANT SIDE Right    At Risk Side Left    BASELINE SCORE (UNILATERAL) 3.9                  Quick Dash - 12/27/20 0001     Open a tight or new jar No difficulty    Do heavy household chores (wash walls, wash floors) No difficulty    Carry a shopping bag or briefcase No difficulty    Wash your back Mild difficulty    Use a knife to cut food Mild difficulty    Recreational activities in which you take some force  or impact through your arm, shoulder, or hand (golf, hammering, tennis) Mild difficulty    During the past week, to what extent has your arm, shoulder or hand problem interfered with your normal social activities with family, friends, neighbors, or  groups? Quite a bit    During the past week, to what extent has your arm, shoulder or hand problem limited your work or other regular daily activities Slightly    Arm, shoulder, or hand pain. Severe    Tingling (pins and needles) in your arm, shoulder, or hand Severe    Difficulty Sleeping So much difficuSo much difficulty, I can't sleep    DASH Score 38.64 %              Objective measurements completed on examination: See above findings.               PT Education - 12/27/20 1634     Education Details Lymphedema education and post op ROM HEP    Person(s) Educated Patient    Methods Explanation;Demonstration;Handout    Comprehension Returned demonstration;Verbalized understanding                 PT Long Term Goals - 12/27/20 1637       PT LONG TERM GOAL #1   Title Patient will demonstrate she has regained full shoulder ROM and function post operatively compared to baselines.    Baseline See objective measurements taken today for baselines.    Time 6    Period Months    Status New    Target Date 06/28/21             Breast Clinic Goals - 12/27/20 1637       Patient will be able to verbalize understanding of pertinent lymphedema risk reduction practices relevant to her diagnosis specifically related to skin care.   Baseline No knowledge    Time 1    Period Days    Status Achieved      Patient will be able to return demonstrate and/or verbalize understanding of the post-op home exercise program related to regaining shoulder range of motion.   Baseline No knowledge    Time 1    Period Days    Status Achieved      Patient will be able to verbalize understanding of the importance of attending the postoperative After Breast Cancer Class for further lymphedema risk reduction education and therapeutic exercise.   Baseline No knowledge    Time 1    Period Days    Status Achieved                   Plan - 12/27/20 1635     Clinical  Impression Statement Patient was diagnosed on 12/17/2020 with left grade III invasive ductal carcinoma breast cancer. There are 2 masses that measure 2.1 cm and 1.6 cm and are located in the upper outer quadrant. It is triple positive with a Ki67 of 25% with 2 known positive axillary lymph nodes. She is planning to undergo neoadjuvant chemotherapy followed by a right mastectomy and targeted axillary node dissection and radiation, followed by anti-estrogen therapy. She will benefit from a post op PT reassessment to determine needs and from L-Dex screens every 3 months for 2 years to detect subclinical lymphedema.    Stability/Clinical Decision Making Stable/Uncomplicated    Clinical Decision Making Low    Rehab Potential Excellent    PT Frequency --   Eval and 1 f/u visit  PT Treatment/Interventions ADLs/Self Care Home Management;Therapeutic exercise;Patient/family education    PT Next Visit Plan Will reassess 3-4 weeks post op    PT Home Exercise Plan Post Op ROM HEP    Consulted and Agree with Plan of Care Patient;Family member/caregiver    Family Member Consulted Godmother to her kids             Patient will benefit from skilled therapeutic intervention in order to improve the following deficits and impairments:  Postural dysfunction, Decreased range of motion, Decreased knowledge of precautions, Impaired UE functional use, Pain  Visit Diagnosis: Malignant neoplasm of upper-outer quadrant of left breast in female, estrogen receptor positive ([place]) - Plan: PT plan of care cert/re-cert  Abnormal posture - Plan: PT plan of care cert/re-cert     Problem List Patient Active Problem List   Diagnosis Date Noted   Malignant neoplasm of upper-outer quadrant of left breast in female, estrogen receptor positive ([place]) 12/23/2020   Urinary problem 11/15/2020   Hypertension 11/15/2020   Encounter for female sterilization procedure 09/24/2016   Positive GBS test 09/23/2016   Supervision of  other high risk pregnancy, antepartum 09/20/2016   Pregnancy affected by fetal growth restriction 09/20/2016   GDM (gestational diabetes mellitus) 09/11/2016   Gestational hypertension without significant proteinuria 08/29/2016   History of postpartum hemorrhage, currently pregnant 05/25/2016   Drug use affecting pregnancy, antepartum 05/25/2016   Bipolar 1 disorder ([place]) 05/25/2016   Insomnia 05/25/2016   Monochorionic diamniotic twin gestation in third trimester 04/25/2016   Annia Friendly, PT 12/27/20 4:40 PM   [place], [place], [address] Phone: [phone]   Fax:  [phone]  Name: Robin Horn MRN: [id number] Date of Birth: 20-Aug-1985

## 2020-12-27 NOTE — Patient Instructions (Signed)

## 2020-12-27 NOTE — Telephone Encounter (Signed)
Spoke to pt, denies questions or concerns regarding dx or treatment care plan. Confirmed future appts. Encourage pt to call with needs. Received verbal understanding. Contact information provided.

## 2020-12-27 NOTE — Progress Notes (Signed)
[place] [facility]  Clinical Social Work was referred by Engineer, site for assessment of psychosocial needs for new patient.  Clinical Social Worker contacted patient by phone  to offer support and assess for needs.    Patient reports doing "okay" currently. Trying to adjust to diagnosis and all of her appointments. She reports having strong support in her life. Pt also has kids ranging in age from 34yo twins - 35yo.    CSW and patient discussed common feeling and emotions when being diagnosed with cancer, and the importance of support during treatment.  CSW informed patient of the support team and support services at [facility]. Patient denied any needs at this time. CSW provided contact information and encouraged patient to call with any questions or concerns.    [place], [place] Worker [employer]

## 2020-12-28 ENCOUNTER — Telehealth: Payer: Self-pay

## 2020-12-28 ENCOUNTER — Telehealth: Payer: Self-pay | Admitting: Hematology and Oncology

## 2020-12-28 NOTE — Telephone Encounter (Signed)
Pt called to inquire about MD helping with pain management, requesting pain medication.   Pt newly diagnosed breast cancer. History of scoliosis per patient.    Pain is in left breast, and lower back. Pt stated, "I have scoliosis and was given pain medication but I am out."  Denies any recent injuries.  Pt denies having PCP, but is working on getting established.   RN encouraged patient to use OTC analgesics.  Pt aware that request with be reviewed with MD.

## 2020-12-28 NOTE — Telephone Encounter (Signed)
Scheduled appt per 8/26 sch msg. Pt aware.  

## 2020-12-28 NOTE — Telephone Encounter (Signed)
MD reviewed request for pain management.  Per MD pt has pain doctor and would recommend patient to continue pain management @ [facility].  Dr. Lindi Adie will not be prescribing narcotics.    RN notified pt.  Pt verbalized understanding and will contact pain doctor.

## 2020-12-29 ENCOUNTER — Encounter (HOSPITAL_BASED_OUTPATIENT_CLINIC_OR_DEPARTMENT_OTHER): Payer: Self-pay | Admitting: General Surgery

## 2020-12-29 ENCOUNTER — Ambulatory Visit (INDEPENDENT_AMBULATORY_CARE_PROVIDER_SITE_OTHER): Payer: Medicaid Other | Admitting: Internal Medicine

## 2020-12-29 ENCOUNTER — Telehealth: Payer: Self-pay

## 2020-12-29 DIAGNOSIS — C50412 Malignant neoplasm of upper-outer quadrant of left female breast: Secondary | ICD-10-CM | POA: Diagnosis not present

## 2020-12-29 DIAGNOSIS — Z170 Estrogen receptor positive status [ER+]: Secondary | ICD-10-CM

## 2020-12-29 NOTE — Progress Notes (Signed)
  [facility] Internal Medicine Residency Telephone Encounter Continuity Care Appointment  HPI:  This telephone encounter was created for Ms. Richland on 12/29/2020 for the following purpose/cc: meed for pain management clinic referral. Please see problem based charting for detailed assessment and plan.   Past Medical History:  Past Medical History:  Diagnosis Date   Abnormal Pap smear of cervix    Anxiety    Chlamydia    Depression    Gestational diabetes 2018   Gonorrhea    Herpes genitalia    Migraines    Ovarian cyst    Polycystic kidney disease    Preeclampsia    Pregnancy induced hypertension      ROS:  Constitutional: Negative for fevers, chills. HENT: Positive for three decaying teeth, one of which is draining pus (states that she has a referral in place for these to be extracted). Breast: Positive for pain. MSK: Positive for back pain.   Assessment / Plan / Recommendations:  Please see A&P under problem oriented charting for assessment of the patient's acute and chronic medical conditions.  As always, pt is advised that if symptoms worsen or new symptoms arise, they should go to an urgent care facility or to to ER for further evaluation.   Consent and Medical Decision Making:  Patient seen with Dr. Evette Doffing This is a telephone encounter between Blaine Hamper and [name] on 12/29/2020 for pain management referral need. The visit was conducted with the patient located at home and [name] at [facility]. The patient's identity was confirmed using their DOB and current address. The patient has consented to being evaluated through a telephone encounter and understands the associated risks (an examination cannot be done and the patient may need to come in for an appointment) / benefits (allows the patient to remain at home, decreasing exposure to coronavirus). I personally spent 30 minutes on medical discussion.

## 2020-12-29 NOTE — Assessment & Plan Note (Signed)
Telehealth visit conducted for pain management referral. Patient states that she has had worsening breast pain over the last 4-5 days that is keeping her awake at night. She is unable to wear a bra due to the pain though this has been the case for a few months now. She describes the pain as sharp in nature that comes and goes. The pain is deep in the tissue rather than superficial or of the skin. She denies any trauma to the breasts.  She has a history of long-term opioid use for pain related to scoliosis. She states that she was on oxycodone 10 mg for 3-4 years, stopped for a few years, and restarted medication 2 years ago. She does endorse back pain during this visit but relates it to her scoliosis.  In the [facility]. She reports previous use of Vicodin, Cymbalta. At this time she has a short-term supply of oxycodone 15 mg every 6 hours PRN pain that she has been stretching out to make the medication last as well as Belbuca 750 mg film every 12 hours.

## 2020-12-29 NOTE — Telephone Encounter (Signed)
Pt is requesting a call back she is wanting a referral for pain management

## 2020-12-29 NOTE — Telephone Encounter (Signed)
Pt states she has scoliosis,  having dental pain, dealing with breast cancer States she is in pain all the time and requests referral to pain clinic. States oncologist unable to prescribe something for pain.   Of note, pt was referred for PT, Urology, and Dental by Dr Lindi Adie (oncologist) on 08/26 (pt aware)  Telehealth appt given today to discuss pain clinic referral

## 2020-12-30 DIAGNOSIS — Z7689 Persons encountering health services in other specified circumstances: Secondary | ICD-10-CM | POA: Insufficient documentation

## 2020-12-30 DIAGNOSIS — Z170 Estrogen receptor positive status [ER+]: Secondary | ICD-10-CM | POA: Insufficient documentation

## 2020-12-30 DIAGNOSIS — Z5111 Encounter for antineoplastic chemotherapy: Secondary | ICD-10-CM | POA: Insufficient documentation

## 2020-12-30 DIAGNOSIS — Z5112 Encounter for antineoplastic immunotherapy: Secondary | ICD-10-CM | POA: Insufficient documentation

## 2020-12-30 DIAGNOSIS — Z419 Encounter for procedure for purposes other than remedying health state, unspecified: Secondary | ICD-10-CM | POA: Diagnosis not present

## 2020-12-30 DIAGNOSIS — C50412 Malignant neoplasm of upper-outer quadrant of left female breast: Secondary | ICD-10-CM | POA: Insufficient documentation

## 2020-12-30 DIAGNOSIS — E860 Dehydration: Secondary | ICD-10-CM | POA: Insufficient documentation

## 2020-12-30 NOTE — Progress Notes (Signed)
Internal Medicine Clinic Attending  I was present for the phone conversation with this patient. I personally confirmed the key portions of the history and exam documented by Dr. Marlou Sa and I reviewed pertinent patient test results.  The assessment, diagnosis, and plan were formulated together and I agree with the documentation in the resident's note.   Patient with a complex history of chronic pain generators, tolerance to opioids, sensitivity to pain requesting referral to a pain clinic which I think is appropriate.  Its not clear to me that the stage I breast cancer is a pain generator at this time, but she certainly is going to have difficulties in the future after undergoing chemotherapy and mastectomy.  A multimodal approach to her chronic pain at a pain clinic is very reasonable.

## 2021-01-04 ENCOUNTER — Ambulatory Visit: Payer: Medicaid Other | Admitting: Behavioral Health

## 2021-01-04 NOTE — Progress Notes (Signed)
Pharmacist Chemotherapy Monitoring - Initial Assessment    Anticipated start date: 01/11/21   The following has been reviewed per standard work regarding the patient's treatment regimen: The patient's diagnosis, treatment plan and drug doses, and organ/hematologic function Lab orders and baseline tests specific to treatment regimen  The treatment plan start date, drug sequencing, and pre-medications Prior authorization status  Patient's documented medication list, including drug-drug interaction screen and prescriptions for anti-emetics and supportive care specific to the treatment regimen The drug concentrations, fluid compatibility, administration routes, and timing of the medications to be used The patient's access for treatment and lifetime cumulative dose history, if applicable  The patient's medication allergies and previous infusion related reactions, if applicable   Changes made to treatment plan:  N/A  Follow up needed:  Pending authorization for treatment   Benn Moulder, PharmD Pharmacy Resident  01/04/2021 10:14 AM

## 2021-01-05 ENCOUNTER — Encounter (HOSPITAL_COMMUNITY): Payer: Self-pay | Admitting: General Surgery

## 2021-01-05 ENCOUNTER — Other Ambulatory Visit: Payer: Self-pay

## 2021-01-05 NOTE — Progress Notes (Signed)
Per Dr. Ermalene Postin ok for pt to be taking Belbuca. Pt's last dose 01/05/2021 per pt.Robin Horn

## 2021-01-05 NOTE — Anesthesia Preprocedure Evaluation (Addendum)
Anesthesia Evaluation  Patient identified by MRN, date of birth, ID band Patient awake    Reviewed: Allergy & Precautions, NPO status , Patient's Chart, lab work & pertinent test results  Airway Mallampati: II  TM Distance: >3 FB Neck ROM: Full    Dental  (+) Poor Dentition, Dental Advisory Given   Pulmonary neg pulmonary ROS, Current Smoker and Patient abstained from smoking.,    breath sounds clear to auscultation       Cardiovascular hypertension, Pt. on medications Normal cardiovascular exam Rhythm:Regular Rate:Normal     Neuro/Psych  Headaches, PSYCHIATRIC DISORDERS Anxiety Depression Bipolar Disorder    GI/Hepatic Neg liver ROS, GERD  ,  Endo/Other  negative endocrine ROS  Renal/GU Renal InsufficiencyRenal disease  negative genitourinary   Musculoskeletal  (+) Arthritis ,   Abdominal   Peds  Hematology negative hematology ROS (+)   Anesthesia Other Findings Left breast CA  Reproductive/Obstetrics                           Anesthesia Physical Anesthesia Plan  ASA: 2  Anesthesia Plan: General   Post-op Pain Management:    Induction: Intravenous  PONV Risk Score and Plan: 2 and Ondansetron, Dexamethasone and Midazolam  Airway Management Planned: LMA  Additional Equipment:   Intra-op Plan:   Post-operative Plan: Extubation in OR  Informed Consent: I have reviewed the patients History and Physical, chart, labs and discussed the procedure including the risks, benefits and alternatives for the proposed anesthesia with the patient or authorized representative who has indicated his/her understanding and acceptance.     Dental advisory given  Plan Discussed with: CRNA  Anesthesia Plan Comments:         Anesthesia Quick Evaluation

## 2021-01-06 ENCOUNTER — Encounter (HOSPITAL_COMMUNITY): Payer: Self-pay | Admitting: General Surgery

## 2021-01-06 ENCOUNTER — Ambulatory Visit (HOSPITAL_COMMUNITY)
Admission: RE | Admit: 2021-01-06 | Discharge: 2021-01-06 | Disposition: A | Discharge status: Home / Self Care | Payer: Medicaid Other | Attending: General Surgery | Admitting: General Surgery

## 2021-01-06 ENCOUNTER — Ambulatory Visit (HOSPITAL_COMMUNITY): Payer: Medicaid Other | Admitting: Anesthesiology

## 2021-01-06 ENCOUNTER — Encounter (HOSPITAL_COMMUNITY)
Admission: RE | Disposition: A | Discharge status: Home / Self Care | Payer: Self-pay | Source: Home / Self Care | Attending: General Surgery

## 2021-01-06 ENCOUNTER — Ambulatory Visit (HOSPITAL_COMMUNITY): Payer: Medicaid Other

## 2021-01-06 DIAGNOSIS — C50412 Malignant neoplasm of upper-outer quadrant of left female breast: Secondary | ICD-10-CM | POA: Insufficient documentation

## 2021-01-06 DIAGNOSIS — Z170 Estrogen receptor positive status [ER+]: Secondary | ICD-10-CM | POA: Diagnosis not present

## 2021-01-06 DIAGNOSIS — F418 Other specified anxiety disorders: Secondary | ICD-10-CM | POA: Diagnosis not present

## 2021-01-06 DIAGNOSIS — F17200 Nicotine dependence, unspecified, uncomplicated: Secondary | ICD-10-CM | POA: Insufficient documentation

## 2021-01-06 DIAGNOSIS — Z419 Encounter for procedure for purposes other than remedying health state, unspecified: Secondary | ICD-10-CM

## 2021-01-06 DIAGNOSIS — D630 Anemia in neoplastic disease: Secondary | ICD-10-CM | POA: Diagnosis not present

## 2021-01-06 HISTORY — DX: Personal history of other diseases of the digestive system: Z87.19

## 2021-01-06 HISTORY — DX: Unspecified osteoarthritis, unspecified site: M19.90

## 2021-01-06 HISTORY — DX: Anemia, unspecified: D64.9

## 2021-01-06 HISTORY — DX: Malignant (primary) neoplasm, unspecified: C80.1

## 2021-01-06 HISTORY — PX: PORTACATH PLACEMENT: SHX2246

## 2021-01-06 LAB — BASIC METABOLIC PANEL
Anion gap: 12 (ref 5–15)
BUN: 16 mg/dL (ref 6–20)
CO2: 19 mmol/L — ABNORMAL LOW (ref 22–32)
Calcium: 9 mg/dL (ref 8.9–10.3)
Chloride: 106 mmol/L (ref 98–111)
Creatinine, Ser: 1.28 mg/dL — ABNORMAL HIGH (ref 0.44–1.00)
GFR, Estimated: 56 mL/min — ABNORMAL LOW (ref >60)
Glucose, Bld: 89 mg/dL (ref 70–99)
Potassium: 3.5 mmol/L (ref 3.5–5.1)
Sodium: 137 mmol/L (ref 135–145)

## 2021-01-06 LAB — CBC
HCT: 40.9 % (ref 36.0–46.0)
Hemoglobin: 12.7 g/dL (ref 12.0–15.0)
MCH: 28.1 pg (ref 26.0–34.0)
MCHC: 31.1 g/dL (ref 30.0–36.0)
MCV: 90.5 fL (ref 80.0–100.0)
Platelets: 320 10*3/uL (ref 150–400)
RBC: 4.52 MIL/uL (ref 3.87–5.11)
RDW: 14.3 % (ref 11.5–15.5)
WBC: 8.9 10*3/uL (ref 4.0–10.5)
nRBC: 0 % (ref 0.0–0.2)

## 2021-01-06 LAB — POCT PREGNANCY, URINE: Preg Test, Ur: NEGATIVE

## 2021-01-06 SURGERY — INSERTION, TUNNELED CENTRAL VENOUS DEVICE, WITH PORT
Anesthesia: General | Site: Breast

## 2021-01-06 MED ORDER — FENTANYL CITRATE (PF) 100 MCG/2ML IJ SOLN
INTRAMUSCULAR | Status: AC
  Administered 2021-01-06: 25 ug via INTRAVENOUS
  Filled 2021-01-06: qty 2

## 2021-01-06 MED ORDER — PHENYLEPHRINE 40 MCG/ML (10ML) SYRINGE FOR IV PUSH (FOR BLOOD PRESSURE SUPPORT)
PREFILLED_SYRINGE | INTRAVENOUS | Status: AC
  Filled 2021-01-06: qty 10

## 2021-01-06 MED ORDER — FENTANYL CITRATE (PF) 250 MCG/5ML IJ SOLN
INTRAMUSCULAR | Status: DC | PRN
  Administered 2021-01-06 (×5): 50 ug via INTRAVENOUS

## 2021-01-06 MED ORDER — MIDAZOLAM HCL 5 MG/5ML IJ SOLN
INTRAMUSCULAR | Status: DC | PRN
  Administered 2021-01-06: 2 mg via INTRAVENOUS

## 2021-01-06 MED ORDER — PHENYLEPHRINE 40 MCG/ML (10ML) SYRINGE FOR IV PUSH (FOR BLOOD PRESSURE SUPPORT)
PREFILLED_SYRINGE | INTRAVENOUS | Status: DC | PRN
  Administered 2021-01-06: 80 ug via INTRAVENOUS

## 2021-01-06 MED ORDER — HEPARIN SOD (PORK) LOCK FLUSH 100 UNIT/ML IV SOLN
INTRAVENOUS | Status: DC | PRN
  Administered 2021-01-06: 500 [IU]

## 2021-01-06 MED ORDER — 0.9 % SODIUM CHLORIDE (POUR BTL) OPTIME
TOPICAL | Status: DC | PRN
Start: 2021-01-06 — End: 2021-01-06
  Administered 2021-01-06: 1000 mL

## 2021-01-06 MED ORDER — TRAMADOL HCL 50 MG PO TABS: 100.0000 mg | ORAL_TABLET | Freq: Four times a day (QID) | ORAL | 0 refills | Status: DC | PRN

## 2021-01-06 MED ORDER — LIDOCAINE 2% (20 MG/ML) 5 ML SYRINGE
INTRAMUSCULAR | Status: DC | PRN
  Administered 2021-01-06: 60 mg via INTRAVENOUS

## 2021-01-06 MED ORDER — CHLORHEXIDINE GLUCONATE CLOTH 2 % EX PADS: 6.0000 | MEDICATED_PAD | Freq: Once | CUTANEOUS | Status: DC

## 2021-01-06 MED ORDER — LIDOCAINE 2% (20 MG/ML) 5 ML SYRINGE
INTRAMUSCULAR | Status: AC
  Filled 2021-01-06: qty 5

## 2021-01-06 MED ORDER — ENSURE PRE-SURGERY PO LIQD: 296.0000 mL | Freq: Once | ORAL | Status: DC

## 2021-01-06 MED ORDER — BUPIVACAINE HCL (PF) 0.25 % IJ SOLN
INTRAMUSCULAR | Status: DC | PRN
  Administered 2021-01-06: 6 mL

## 2021-01-06 MED ORDER — ACETAMINOPHEN 500 MG PO TABS
1000.0000 mg | ORAL_TABLET | ORAL | Status: AC
  Administered 2021-01-06: 1000 mg via ORAL
  Filled 2021-01-06: qty 2

## 2021-01-06 MED ORDER — PROPOFOL 10 MG/ML IV BOLUS
INTRAVENOUS | Status: AC
  Filled 2021-01-06: qty 20

## 2021-01-06 MED ORDER — CEFAZOLIN SODIUM-DEXTROSE 2-4 GM/100ML-% IV SOLN
2.0000 g | INTRAVENOUS | Status: AC
  Administered 2021-01-06: 2 g via INTRAVENOUS
  Filled 2021-01-06: qty 100

## 2021-01-06 MED ORDER — KETOROLAC TROMETHAMINE 15 MG/ML IJ SOLN
15.0000 mg | INTRAMUSCULAR | Status: AC
  Administered 2021-01-06: 15 mg via INTRAVENOUS
  Filled 2021-01-06: qty 1

## 2021-01-06 MED ORDER — ONDANSETRON HCL 4 MG/2ML IJ SOLN
INTRAMUSCULAR | Status: AC
  Filled 2021-01-06: qty 2

## 2021-01-06 MED ORDER — HEPARIN SOD (PORK) LOCK FLUSH 100 UNIT/ML IV SOLN
INTRAVENOUS | Status: AC
  Filled 2021-01-06: qty 5

## 2021-01-06 MED ORDER — LACTATED RINGERS IV SOLN: INTRAVENOUS | Status: DC

## 2021-01-06 MED ORDER — DEXAMETHASONE SODIUM PHOSPHATE 10 MG/ML IJ SOLN
INTRAMUSCULAR | Status: DC | PRN
  Administered 2021-01-06: 10 mg via INTRAVENOUS

## 2021-01-06 MED ORDER — HEPARIN (PORCINE) IN NACL 2-0.9 UNITS/ML
INTRAMUSCULAR | Status: AC | PRN
  Administered 2021-01-06: 500 mL

## 2021-01-06 MED ORDER — FENTANYL CITRATE (PF) 100 MCG/2ML IJ SOLN
25.0000 ug | INTRAMUSCULAR | Status: DC | PRN
  Administered 2021-01-06: 50 ug via INTRAVENOUS
  Administered 2021-01-06: 25 ug via INTRAVENOUS

## 2021-01-06 MED ORDER — BUPIVACAINE HCL (PF) 0.25 % IJ SOLN
INTRAMUSCULAR | Status: AC
  Filled 2021-01-06: qty 30

## 2021-01-06 MED ORDER — ONDANSETRON HCL 4 MG/2ML IJ SOLN
INTRAMUSCULAR | Status: DC | PRN
  Administered 2021-01-06: 4 mg via INTRAVENOUS

## 2021-01-06 MED ORDER — PROPOFOL 10 MG/ML IV BOLUS
INTRAVENOUS | Status: DC | PRN
  Administered 2021-01-06: 150 mg via INTRAVENOUS
  Administered 2021-01-06: 50 mg via INTRAVENOUS

## 2021-01-06 MED ORDER — FENTANYL CITRATE (PF) 250 MCG/5ML IJ SOLN
INTRAMUSCULAR | Status: AC
  Filled 2021-01-06: qty 5

## 2021-01-06 MED ORDER — CHLORHEXIDINE GLUCONATE 0.12 % MT SOLN
OROMUCOSAL | Status: AC
  Administered 2021-01-06: 15 mL
  Filled 2021-01-06: qty 15

## 2021-01-06 MED ORDER — DEXAMETHASONE SODIUM PHOSPHATE 10 MG/ML IJ SOLN
INTRAMUSCULAR | Status: AC
  Filled 2021-01-06: qty 1

## 2021-01-06 MED ORDER — HEPARIN 6000 UNIT IRRIGATION SOLUTION
Status: AC
  Filled 2021-01-06: qty 500

## 2021-01-06 MED ORDER — MIDAZOLAM HCL 2 MG/2ML IJ SOLN
INTRAMUSCULAR | Status: AC
  Filled 2021-01-06: qty 2

## 2021-01-06 MED ORDER — ACETAMINOPHEN 500 MG PO TABS: 1000.0000 mg | ORAL_TABLET | Freq: Once | ORAL | Status: DC

## 2021-01-06 SURGICAL SUPPLY — 50 items
ADH SKN CLS APL DERMABOND .7 (GAUZE/BANDAGES/DRESSINGS) ×1
APL PRP STRL LF DISP 70% ISPRP (MISCELLANEOUS) ×1
APL SKNCLS STERI-STRIP NONHPOA (GAUZE/BANDAGES/DRESSINGS) ×1
BAG DECANTER FOR FLEXI CONT (MISCELLANEOUS) ×2 IMPLANT
BENZOIN TINCTURE PRP APPL 2/3 (GAUZE/BANDAGES/DRESSINGS) ×2 IMPLANT
BLADE SURG 11 STRL SS (BLADE) ×2 IMPLANT
BLADE SURG 15 STRL LF DISP TIS (BLADE) ×1 IMPLANT
BLADE SURG 15 STRL SS (BLADE) ×2
CANISTER SUCT 1200ML W/VALVE (MISCELLANEOUS) IMPLANT
CHLORAPREP W/TINT 26 (MISCELLANEOUS) ×2 IMPLANT
COVER BACK TABLE 60X90IN (DRAPES) ×2 IMPLANT
COVER MAYO STAND STRL (DRAPES) ×2 IMPLANT
COVER PROBE 5X48 (MISCELLANEOUS)
DECANTER SPIKE VIAL GLASS SM (MISCELLANEOUS) IMPLANT
DERMABOND ADVANCED (GAUZE/BANDAGES/DRESSINGS) ×1
DERMABOND ADVANCED .7 DNX12 (GAUZE/BANDAGES/DRESSINGS) ×1 IMPLANT
DRAPE C-ARM 42X72 X-RAY (DRAPES) ×2 IMPLANT
DRAPE LAPAROSCOPIC ABDOMINAL (DRAPES) ×2 IMPLANT
DRAPE UTILITY XL STRL (DRAPES) ×2 IMPLANT
DRSG TEGADERM 4X4.75 (GAUZE/BANDAGES/DRESSINGS) IMPLANT
ELECT COATED BLADE 2.86 ST (ELECTRODE) ×2 IMPLANT
ELECT REM PT RETURN 9FT ADLT (ELECTROSURGICAL) ×2
ELECTRODE REM PT RTRN 9FT ADLT (ELECTROSURGICAL) ×1 IMPLANT
GAUZE SPONGE 4X4 12PLY STRL LF (GAUZE/BANDAGES/DRESSINGS) ×2 IMPLANT
GLOVE SURG ENC MOIS LTX SZ7 (GLOVE) ×2 IMPLANT
GLOVE SURG UNDER POLY LF SZ7.5 (GLOVE) ×2 IMPLANT
GOWN STRL REUS W/ TWL LRG LVL3 (GOWN DISPOSABLE) ×2 IMPLANT
GOWN STRL REUS W/TWL LRG LVL3 (GOWN DISPOSABLE) ×4
IV KIT MINILOC 20X1 SAFETY (NEEDLE) IMPLANT
KIT CVR 48X5XPRB PLUP LF (MISCELLANEOUS) IMPLANT
KIT PORT POWER 8FR ISP CVUE (Port) ×1 IMPLANT
NDL HYPO 25X1 1.5 SAFETY (NEEDLE) ×1 IMPLANT
NDL SAFETY ECLIPSE 18X1.5 (NEEDLE) IMPLANT
NEEDLE HYPO 18GX1.5 SHARP (NEEDLE)
NEEDLE HYPO 25X1 1.5 SAFETY (NEEDLE) ×2 IMPLANT
PACK BASIN DAY SURGERY FS (CUSTOM PROCEDURE TRAY) ×2 IMPLANT
PENCIL SMOKE EVACUATOR (MISCELLANEOUS) ×2 IMPLANT
SLEEVE SCD COMPRESS KNEE MED (STOCKING) ×2 IMPLANT
STRIP CLOSURE SKIN 1/2X4 (GAUZE/BANDAGES/DRESSINGS) ×2 IMPLANT
SUT MNCRL AB 4-0 PS2 18 (SUTURE) ×2 IMPLANT
SUT PROLENE 2 0 SH DA (SUTURE) ×2 IMPLANT
SUT SILK 2 0 TIES 17X18 (SUTURE)
SUT SILK 2-0 18XBRD TIE BLK (SUTURE) IMPLANT
SUT VIC AB 3-0 SH 27 (SUTURE) ×2
SUT VIC AB 3-0 SH 27X BRD (SUTURE) ×1 IMPLANT
SYR 5ML LUER SLIP (SYRINGE) ×2 IMPLANT
SYR CONTROL 10ML LL (SYRINGE) ×2 IMPLANT
TOWEL GREEN STERILE FF (TOWEL DISPOSABLE) ×2 IMPLANT
TUBE CONNECTING 20X1/4 (TUBING) IMPLANT
YANKAUER SUCT BULB TIP NO VENT (SUCTIONS) IMPLANT

## 2021-01-06 NOTE — Anesthesia Postprocedure Evaluation (Signed)
Anesthesia Post Note  Patient: Robin Horn  Procedure(s) Performed: INSERTION PORT-A-CATH (Breast)     Patient location during evaluation: PACU Anesthesia Type: General Level of consciousness: awake and alert Pain management: pain level controlled Vital Signs Assessment: post-procedure vital signs reviewed and stable Respiratory status: spontaneous breathing, nonlabored ventilation, respiratory function stable and patient connected to nasal cannula oxygen Cardiovascular status: blood pressure returned to baseline and stable Postop Assessment: no apparent nausea or vomiting Anesthetic complications: no   No notable events documented.  Last Vitals:  Vitals:   01/06/21 1030 01/06/21 1035  BP: (!) 130/99 (!) 137/95  Pulse: 87 83  Resp: 15 16  Temp:  36.7 C  SpO2: 100% 100%    Last Pain:  Vitals:   01/06/21 1000  TempSrc:   PainSc: 8                  Robin Horn

## 2021-01-06 NOTE — Transfer of Care (Signed)
Immediate Anesthesia Transfer of Care Note  Patient: Robin Horn  Procedure(s) Performed: INSERTION PORT-A-CATH (Breast)  Patient Location: PACU  Anesthesia Type:General  Level of Consciousness: awake, oriented and patient cooperative  Airway & Oxygen Therapy: Patient Spontanous Breathing and Patient connected to nasal cannula oxygen  Post-op Assessment: Report given to RN and Post -op Vital signs reviewed and stable  Post vital signs: Reviewed  Last Vitals:  Vitals Value Taken Time  BP 153/124 01/06/21 0937  Temp    Pulse 108 01/06/21 0937  Resp 18 01/06/21 0937  SpO2 100 % 01/06/21 0937  Vitals shown include unvalidated device data.  Last Pain:  Vitals:   01/06/21 0704  TempSrc:   PainSc: 8          Complications: No notable events documented.

## 2021-01-06 NOTE — H&P (Signed)
35 y.o. female who is seen today as an office consultation at the request of Dr. Lindi Adie for evaluation of Breast Cancer .  She breast mass couple of weeks ago. This is her some pain. She has no discharge. She has a family history and a maternal grandmother in her 61s, maternal aunt in her 27s, and her mom sister at age 39. She has not had any genetic testing. She underwent evaluation with mammogram that showed the density breast. She underwent mammogram and ultrasound. The ultrasound showed at the palpable site at 1:00 8 cm from the nipple a 2.1 cm mass. There is a second palpable area 2:00 7 cm from the nipple measuring 1.6 cm. At 1233 cm from the nipple there is a 2.5 cm mass. Left axilla shows 2 lymph nodes with increased cortical thickness and 1 with borderline increased cortical thickness. She has had biopsies of all 4 of these. 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 This is grade 3.80% er pos, pr pos 3%, her 2 pos, Ki 25%. She is here with her mom to discuss options  Review of Systems: A complete review of systems was obtained from the patient. I have reviewed this information and discussed as appropriate with the patient. See HPI as well for other ROS.  Review of Systems  Constitutional: Positive for malaise/fatigue.  All other systems reviewed and are negative.   Medical History: History reviewed. No pertinent past medical history.  Patient Active Problem List  Diagnosis   Bipolar 1 disorder (CMS-HCC)   Malignant neoplasm of upper-outer quadrant of left breast in female, estrogen receptor positive (CMS-HCC)   History reviewed. No pertinent surgical history.   Allergies  Allergen Reactions   Lactose Diarrhea   Current Outpatient Medications on File Prior to Visit  Medication Sig Dispense Refill   ARIPiprazole (ABILIFY) 2 MG tablet   azithromycin (ZITHROMAX) 500 MG tablet   BELBUCA 300 mcg buccal film   butalbital-acetaminophen-caffeine (FIORICET) 50-325-40  mg tablet   cetirizine (ZYRTEC) 10 MG tablet   ciprofloxacin HCl (CIPRO) 500 MG tablet   cyclobenzaprine (FLEXERIL) 10 MG tablet   dexAMETHasone (DECADRON) 4 MG tablet   DULoxetine (CYMBALTA) 60 MG DR capsule   ergocalciferol, vitamin D2, 1,250 mcg (50,000 unit) capsule   fluconazole (DIFLUCAN) 100 MG tablet   lidocaine-prilocaine (EMLA) cream   lisinopriL (ZESTRIL) 10 MG tablet   medroxyPROGESTERone (DEPO-PROVERA) 150 mg/mL IM syringe   ondansetron (ZOFRAN) 8 MG tablet   prochlorperazine (COMPAZINE) 10 MG tablet   zolpidem (AMBIEN) 5 MG tablet   No current facility-administered medications on file prior to visit.   History reviewed. No pertinent family history.   Social History   Tobacco Use  Smoking Status Current Every Day Smoker   Packs/day: 1.00  Smokeless Tobacco Never Used    Social History   Socioeconomic History   Marital status: Unknown  Tobacco Use   Smoking status: Current Every Day Smoker  Packs/day: 1.00   Smokeless tobacco: Never Used   Objective:   Vitals:  12/24/20 1018  BP: (!) 140/96  Pulse: (!) 119  Temp: 36.8 C (98.3 F)  SpO2: 99%  Weight: 72.5 kg (159 lb 12.8 oz)  Height: 154.9 cm ('5\' 1"'$ )   Body mass index is 30.19 kg/m.  Physical Exam Constitutional:  Comments: Somnolent  Cardiovascular:  Rate and Rhythm: Normal rate.  Pulmonary:  Effort: Pulmonary effort is normal.  Chest:  Breasts:  Right: No inverted nipple or mass.  Left: Mass present.  No inverted nipple.   Lymphadenopathy:  Upper Body:  Right upper body: No supraclavicular or axillary adenopathy.  Left upper body: Axillary adenopathy present. No supraclavicular adenopathy.   2 left breast masses as in drawing about 1.5 cm each, palp ax node vs hematoma from biopsy  Assessment and Plan:  Diagnoses and all orders for this visit:  Malignant neoplasm of upper-outer quadrant of left breast in female, estrogen receptor positive (CMS-HCC)   Port placement, MRI,  genetics, systemic therapy  We discussed the staging and pathophysiology of breast cancer. We discussed all of the different options for treatment for breast cancer including surgery, chemotherapy, radiation therapy, Herceptin, and antiestrogen therapy.  I do think more than likely she will require a mastectomy. I do agree with starting with primary systemic therapy to at least downstage her nodes and some information Ostex with a pathologic response at the time of surgery. We discussed that today. We discussed that we will wait for surgical decision-making once her genetics, MRI, and her response to primary systemic therapy are back. We will plan to do surgery about 3 to 4 weeks after she finishes chemotherapy. I did discuss port placement with her today. I am going to try to schedule that in the next couple of weeks.

## 2021-01-06 NOTE — Op Note (Signed)
Preoperative diagnosis: Left breast cancer Postoperative diagnosis: Same as above Procedure:  Right IJ  port placement Surgeon: Dr. Serita Grammes Anesthesia: General  Estimated blood loss: minimal Specimens:none Sponge and needle count was correct at completion Drains: None Disposition recovery stable condition   Indications:   55 yof  underwent evaluation with mammogram that showed the density breast. She underwent mammogram and ultrasound. The ultrasound showed at the palpable site at 1:00 8 cm from the nipple a 2.1 cm mass. There is a second palpable area 2:00 7 cm from the nipple measuring 1.6 cm.there is a 2.5 cm mass. Left axilla shows 2 lymph nodes with increased cortical thickness and 1 with borderline increased cortical thickness. She has had biopsies of all 4 of these. 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 This is grade 3.80% er pos, pr pos 3%, her 2 pos, Ki 25%. We elected at [facility] to proceed with port placement and primary systemic therapy.     Procedure: After informed consent was obtained she was taken to the OR. She was given antibiotics.  SCDs were placed.  She was placed under general anesthesia without complication.  She was prepped and draped in the standard sterile surgical fashion.  A surgical timeout was then performed.   I used the ultrasound to identify the right IJ. I then accessed this on the first pass.  I placed the wire and this was in good position by fluoroscopy and in the vein by ultrasound.   I then made an incision on her right chest and created a pocket.  I tunneled the line between the 2 sites.  I then placed the dilator over the wire.  I observed this with fluoroscopy to go in the correct position.  I then removed the wire.  I then passed the line.  The peel-away sheath was removed.  I pulled the line back to be in the superior vena cava. The tip of the line is in the superior vena cava near the cavoatrial junction. I then attached the port.  I  sutured this into place with 2-0 Prolene. I adjusted the line at the top so it was not kinked.  I then closed this with 3-0 Vicryl and 4-0 Monocryl.  Glue was placed.  Final fluoroscopic image showed the port to be in good position.  I then accessed the port and was able to aspirate blood and packed this with heparin.  She tolerated well, was transferred to recovery stable.

## 2021-01-06 NOTE — Interval H&P Note (Signed)
History and Physical Interval Note:  01/06/2021 8:15 AM  Robin Horn  has presented today for surgery, with the diagnosis of BREAST CANCER.  The various methods of treatment have been discussed with the patient and family. After consideration of risks, benefits and other options for treatment, the patient has consented to  Procedure(s): INSERTION PORT-A-CATH (N/A) as a surgical intervention.  The patient's history has been reviewed, patient examined, no change in status, stable for surgery.  I have reviewed the patient's chart and labs.  Questions were answered to the patient's satisfaction.     Rolm Bookbinder

## 2021-01-06 NOTE — Anesthesia Procedure Notes (Signed)
Procedure Name: LMA Insertion Date/Time: 01/06/2021 8:34 AM Performed by: Jenne Campus, CRNA Pre-anesthesia Checklist: Patient identified, Emergency Drugs available, Suction available and Patient being monitored Patient Re-evaluated:Patient Re-evaluated prior to induction Oxygen Delivery Method: Circle System Utilized Preoxygenation: Pre-oxygenation with 100% oxygen Induction Type: IV induction Ventilation: Mask ventilation without difficulty LMA: LMA inserted LMA Size: 4.0 Number of attempts: 1 Airway Equipment and Method: Bite block Placement Confirmation: positive ETCO2 and breath sounds checked- equal and bilateral Tube secured with: Tape Dental Injury: Teeth and Oropharynx as per pre-operative assessment

## 2021-01-06 NOTE — Discharge Instructions (Signed)
PORT-A-CATH: POST OP INSTRUCTIONS  Always review your discharge instruction sheet given to you by the facility where your surgery was performed.   A prescription for pain medication may be given to you upon discharge. Take your pain medication as prescribed, if needed. If narcotic pain medicine is not needed, then you make take acetaminophen (Tylenol) or ibuprofen (Advil) as needed.  Take your usually prescribed medications unless otherwise directed. If you need a refill on your pain medication, please contact our office. All narcotic pain medicine now requires a paper prescription.  Phoned in and fax refills are no longer allowed by law.  Prescriptions will not be filled after 5 pm or on weekends.  You should follow a light diet for the remainder of the day after your procedure. Most patients will experience some mild swelling and/or bruising in the area of the incision. It may take several days to resolve. It is common to experience some constipation if taking pain medication after surgery. Increasing fluid intake and taking a stool softener (such as Colace) will usually help or prevent this problem from occurring. A mild laxative (Milk of Magnesia or Miralax) should be taken according to package directions if there are no bowel movements after 48 hours.  Unless discharge instructions indicate otherwise, you may remove your bandages 48 hours after surgery, and you may shower at that time. You may have steri-strips (small white skin tapes) in place directly over the incision.  These strips should be left on the skin for 7-10 days.  If your surgeon used Dermabond (skin glue) on the incision, you may shower in 24 hours.  The glue will flake off over the next 2-3 weeks.  If your port is left accessed at the end of surgery (needle left in port), the dressing cannot get wet and should only by changed by a healthcare professional. When the port is no longer accessed (when the needle has been removed), follow  step 7.   ACTIVITIES:  Limit activity involving your arms for the next 72 hours. Do no strenuous exercise or activity for 1 week. You may drive when you are no longer taking prescription pain medication, you can comfortably wear a seatbelt, and you can maneuver your car. 10.You may need to see your doctor in the office for a follow-up appointment.  Please       check with your doctor.  11.When you receive a new Port-a-Cath, you will get a product guide and        ID card.  Please keep them in case you need them.  WHEN TO CALL YOUR DOCTOR ([phone]): Fever over 101.0 Chills Continued bleeding from incision Increased redness and tenderness at the site Shortness of breath, difficulty breathing   The clinic staff is available to answer your questions during regular business hours. Please don't hesitate to call and ask to speak to one of the nurses or medical assistants for clinical concerns. If you have a medical emergency, go to the nearest emergency room or call 911.  A surgeon from [facility] is always on call at the hospital.     For further information, please visit [email or web address]      

## 2021-01-06 NOTE — H&P (View-Only) (Signed)
35 y.o. female who is seen today as an office consultation at the request of Dr. Lindi Adie for evaluation of Breast Cancer .  She breast mass couple of weeks ago. This is her some pain. She has no discharge. She has a family history and a maternal grandmother in her 35s, maternal aunt in her 37s, and her mom sister at age 68. She has not had any genetic testing. She underwent evaluation with mammogram that showed the density breast. She underwent mammogram and ultrasound. The ultrasound showed at the palpable site at 1:00 8 cm from the nipple a 2.1 cm mass. There is a second palpable area 2:00 7 cm from the nipple measuring 1.6 cm. At 1233 cm from the nipple there is a 2.5 cm mass. Left axilla shows 2 lymph nodes with increased cortical thickness and 1 with borderline increased cortical thickness. She has had biopsies of all 4 of these. 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 This is grade 3.80% er pos, pr pos 3%, her 2 pos, Ki 25%. She is here with her mom to discuss options  Review of Systems: A complete review of systems was obtained from the patient. I have reviewed this information and discussed as appropriate with the patient. See HPI as well for other ROS.  Review of Systems  Constitutional: Positive for malaise/fatigue.  All other systems reviewed and are negative.   Medical History: History reviewed. No pertinent past medical history.  Patient Active Problem List  Diagnosis   Bipolar 1 disorder (CMS-HCC)   Malignant neoplasm of upper-outer quadrant of left breast in female, estrogen receptor positive (CMS-HCC)   History reviewed. No pertinent surgical history.   Allergies  Allergen Reactions   Lactose Diarrhea   Current Outpatient Medications on File Prior to Visit  Medication Sig Dispense Refill   ARIPiprazole (ABILIFY) 2 MG tablet   azithromycin (ZITHROMAX) 500 MG tablet   BELBUCA 300 mcg buccal film   butalbital-acetaminophen-caffeine (FIORICET) 50-325-40  mg tablet   cetirizine (ZYRTEC) 10 MG tablet   ciprofloxacin HCl (CIPRO) 500 MG tablet   cyclobenzaprine (FLEXERIL) 10 MG tablet   dexAMETHasone (DECADRON) 4 MG tablet   DULoxetine (CYMBALTA) 60 MG DR capsule   ergocalciferol, vitamin D2, 1,250 mcg (50,000 unit) capsule   fluconazole (DIFLUCAN) 100 MG tablet   lidocaine-prilocaine (EMLA) cream   lisinopriL (ZESTRIL) 10 MG tablet   medroxyPROGESTERone (DEPO-PROVERA) 150 mg/mL IM syringe   ondansetron (ZOFRAN) 8 MG tablet   prochlorperazine (COMPAZINE) 10 MG tablet   zolpidem (AMBIEN) 5 MG tablet   No current facility-administered medications on file prior to visit.   History reviewed. No pertinent family history.   Social History   Tobacco Use  Smoking Status Current Every Day Smoker   Packs/day: 1.00  Smokeless Tobacco Never Used    Social History   Socioeconomic History   Marital status: Unknown  Tobacco Use   Smoking status: Current Every Day Smoker  Packs/day: 1.00   Smokeless tobacco: Never Used   Objective:   Vitals:  12/24/20 1018  BP: (!) 140/96  Pulse: (!) 119  Temp: 36.8 C (98.3 F)  SpO2: 99%  Weight: 72.5 kg (159 lb 12.8 oz)  Height: 154.9 cm ('5\' 1"'$ )   Body mass index is 30.19 kg/m.  Physical Exam Constitutional:  Comments: Somnolent  Cardiovascular:  Rate and Rhythm: Normal rate.  Pulmonary:  Effort: Pulmonary effort is normal.  Chest:  Breasts:  Right: No inverted nipple or mass.  Left: Mass present.  No inverted nipple.   Lymphadenopathy:  Upper Body:  Right upper body: No supraclavicular or axillary adenopathy.  Left upper body: Axillary adenopathy present. No supraclavicular adenopathy.   2 left breast masses as in drawing about 1.5 cm each, palp ax node vs hematoma from biopsy  Assessment and Plan:  Diagnoses and all orders for this visit:  Malignant neoplasm of upper-outer quadrant of left breast in female, estrogen receptor positive (CMS-HCC)   Port placement, MRI,  genetics, systemic therapy  We discussed the staging and pathophysiology of breast cancer. We discussed all of the different options for treatment for breast cancer including surgery, chemotherapy, radiation therapy, Herceptin, and antiestrogen therapy.  I do think more than likely she will require a mastectomy. I do agree with starting with primary systemic therapy to at least downstage her nodes and some information Ostex with a pathologic response at the time of surgery. We discussed that today. We discussed that we will wait for surgical decision-making once her genetics, MRI, and her response to primary systemic therapy are back. We will plan to do surgery about 3 to 4 weeks after she finishes chemotherapy. I did discuss port placement with her today. I am going to try to schedule that in the next couple of weeks.

## 2021-01-07 ENCOUNTER — Encounter (HOSPITAL_COMMUNITY): Payer: Self-pay | Admitting: General Surgery

## 2021-01-08 ENCOUNTER — Ambulatory Visit
Admission: RE | Admit: 2021-01-08 | Discharge: 2021-01-08 | Disposition: A | Discharge status: Home / Self Care | Payer: Medicaid Other | Source: Ambulatory Visit | Attending: Hematology and Oncology | Admitting: Hematology and Oncology

## 2021-01-08 ENCOUNTER — Other Ambulatory Visit: Payer: Self-pay

## 2021-01-08 DIAGNOSIS — C50412 Malignant neoplasm of upper-outer quadrant of left female breast: Secondary | ICD-10-CM

## 2021-01-08 DIAGNOSIS — D0512 Intraductal carcinoma in situ of left breast: Secondary | ICD-10-CM | POA: Diagnosis not present

## 2021-01-08 DIAGNOSIS — Z170 Estrogen receptor positive status [ER+]: Secondary | ICD-10-CM

## 2021-01-08 MED ORDER — GADOBUTROL 1 MMOL/ML IV SOLN
8.0000 mL | Freq: Once | INTRAVENOUS | Status: AC | PRN
  Administered 2021-01-08: 8 mL via INTRAVENOUS

## 2021-01-10 ENCOUNTER — Ambulatory Visit (HOSPITAL_COMMUNITY)
Admission: RE | Admit: 2021-01-10 | Discharge: 2021-01-10 | Disposition: A | Discharge status: Home / Self Care | Payer: Medicaid Other | Source: Ambulatory Visit | Attending: Hematology and Oncology | Admitting: Hematology and Oncology

## 2021-01-10 ENCOUNTER — Inpatient Hospital Stay: Payer: Medicaid Other | Attending: Hematology and Oncology

## 2021-01-10 ENCOUNTER — Other Ambulatory Visit: Payer: Self-pay

## 2021-01-10 ENCOUNTER — Encounter: Payer: Self-pay | Admitting: Hematology and Oncology

## 2021-01-10 DIAGNOSIS — C50412 Malignant neoplasm of upper-outer quadrant of left female breast: Secondary | ICD-10-CM

## 2021-01-10 DIAGNOSIS — I071 Rheumatic tricuspid insufficiency: Secondary | ICD-10-CM | POA: Insufficient documentation

## 2021-01-10 DIAGNOSIS — Z0189 Encounter for other specified special examinations: Secondary | ICD-10-CM

## 2021-01-10 DIAGNOSIS — Z01818 Encounter for other preprocedural examination: Secondary | ICD-10-CM | POA: Diagnosis not present

## 2021-01-10 DIAGNOSIS — I10 Essential (primary) hypertension: Secondary | ICD-10-CM | POA: Diagnosis not present

## 2021-01-10 DIAGNOSIS — Z170 Estrogen receptor positive status [ER+]: Secondary | ICD-10-CM

## 2021-01-10 LAB — ECHOCARDIOGRAM COMPLETE
Area-P 1/2: 5.02 cm2
S' Lateral: 3.1 cm

## 2021-01-10 NOTE — Progress Notes (Signed)
  Echocardiogram 2D Echocardiogram  has been performed.  Robin Horn M 01/10/2021, 10:39 AM

## 2021-01-10 NOTE — Progress Notes (Signed)
Patient Care Team: [name], DO as PCP - General (Internal Medicine) Claudia Pollock, RN as Registered Nurse Dayna Barker, MD as Consulting Physician (General Surgery) Mauro Kaufmann, RN as Oncology Nurse Navigator Rockwell Germany, RN as Oncology Nurse Navigator  DIAGNOSIS:    ICD-10-CM   1. Malignant neoplasm of upper-outer quadrant of left breast in female, estrogen receptor positive ([place])  C50.412 dexamethasone (DECADRON) 4 MG tablet   Z17.0       SUMMARY OF ONCOLOGIC HISTORY: Oncology History  Left breast lump (Resolved)  Malignant neoplasm of upper-outer quadrant of left breast in female, estrogen receptor positive ([place])  12/17/2020 Initial Diagnosis   Palpable lump in the upper outer left breast. 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, Her2+ (3+)/ER+ (80%)/PR- (<1%) in the left breast, and invasive ductal carcinoma, Her2+ (3+)/ER+ (80%)/PR+ (3%) in the left axillary lymph node   12/23/2020 Cancer Staging   Staging form: Breast, AJCC 8th Edition - Clinical stage from 12/23/2020: Stage IB (cT2, cN1, cM0, G3, ER+, PR+, HER2+) - Signed by [name], MD on 12/23/2020 Stage prefix: Initial diagnosis Histologic grading system: 3 grade system   01/11/2021 -  Chemotherapy    Patient is on Treatment Plan: BREAST  DOCETAXEL + CARBOPLATIN + TRASTUZUMAB + PERTUZUMAB  (TCHP) Q21D          CHIEF COMPLIANT: Cycle 1 TCH Perjeta   INTERVAL HISTORY: Robin Horn is a 35 y.o. with above-mentioned history of left breast cancer, to start cycle 1 of [place] today. She presents to the clinic today for treatment.  She has chronic low back pain related to scoliosis.  She tells me that her prescriptions have not been filled and therefore she is in a lot of pain and  discomfort.  She cannot sleep at night because of this.  She also ran out of her Seroquel for sleep.  ALLERGIES:  is allergic to hydrocodone-acetaminophen and lactose intolerance (gi).  MEDICATIONS:  Current Outpatient Medications  Medication Sig Dispense Refill   BELBUCA 750 MCG FILM Take 1 strip by mouth 2 (two) times daily.     butalbital-acetaminophen-caffeine (FIORICET) 50-325-40 MG tablet Take 1 tablet by mouth 2 (two) times daily as needed.     dexamethasone (DECADRON) 4 MG tablet Take 1 tablet (4 mg total) by mouth daily. Take 1 tablet day before chemo and 1 tablet day after chemo with food 12 tablet 0   lidocaine-prilocaine (EMLA) cream Apply to affected area once 30 g 3   lisinopril (ZESTRIL) 10 MG tablet Take 10 mg by mouth daily.     ondansetron (ZOFRAN) 8 MG tablet Take 1 tablet (8 mg total) by mouth 2 (two) times daily as needed (Nausea or vomiting). Start on the third day after chemotherapy. 30 tablet 1   oxyCODONE (ROXICODONE) 15 MG immediate release tablet Take 1 tablet (15 mg total) by mouth every 8 (eight) hours as needed for pain. 60 tablet 0   prochlorperazine (COMPAZINE) 10 MG tablet Take 1 tablet (10 mg total) by mouth every 6 (six) hours as needed (Nausea or vomiting). 30 tablet 1   QUEtiapine (SEROQUEL) 100 MG tablet Take 1 tablet (100 mg total) by mouth at bedtime. 30 tablet 3   zolpidem (AMBIEN) 5 MG tablet Take 1 tablet (5 mg total) by mouth at bedtime as needed.  30 tablet 3   No current facility-administered medications for this visit.   Facility-Administered Medications Ordered in Other Visits  Medication Dose Route Frequency Provider Last Rate Last Admin   CARBOplatin (PARAPLATIN) 700 mg in sodium chloride 0.9 % 250 mL chemo infusion  700 mg Intravenous Once [name], MD       DOCEtaxel (TAXOTERE) 130 mg in sodium chloride 0.9 % 250 mL chemo infusion  75 mg/m2 (Treatment Plan Recorded) Intravenous Once [name], MD       pertuzumab (PERJETA) 420 mg in  sodium chloride 0.9 % 250 mL chemo infusion  420 mg Intravenous Once [name], MD 264 mL/hr at 01/11/21 1326 420 mg at 01/11/21 1326    PHYSICAL EXAMINATION: ECOG PERFORMANCE STATUS: 1 - Symptomatic but completely ambulatory  Vitals:   01/11/21 0936  BP: (!) 155/99  Pulse: 91  Resp: 18  Temp: (!) 97.2 F (36.2 C)  SpO2: 100%   Filed Weights   01/11/21 0936  Weight: 165 lb 3.2 oz (74.9 kg)    LABORATORY DATA:  I have reviewed the data as listed CMP Latest Ref Rng & Units 01/11/2021 01/06/2021 09/18/2018  Glucose 70 - 99 mg/dL 98 89 95  BUN 6 - 20 mg/dL 17 16 9   Creatinine 0.44 - 1.00 mg/dL 1.09(H) 1.28(H) 0.90  Sodium 135 - 145 mmol/L 141 137 139  Potassium 3.5 - 5.1 mmol/L 4.3 3.5 3.5  Chloride 98 - 111 mmol/L 109 106 102  CO2 22 - 32 mmol/L 24 19(L) 26  Calcium 8.9 - 10.3 mg/dL 9.0 9.0 9.7  Total Protein 6.5 - 8.1 g/dL 6.7 - -  Total Bilirubin 0.3 - 1.2 mg/dL <0.2(L) - -  Alkaline Phos 38 - 126 U/L 60 - -  AST 15 - 41 U/L 16 - -  ALT 0 - 44 U/L 28 - -    Lab Results  Component Value Date   WBC 6.4 01/11/2021   HGB 11.3 (L) 01/11/2021   HCT 34.6 (L) 01/11/2021   MCV 87.2 01/11/2021   PLT 269 01/11/2021   NEUTROABS 3.6 01/11/2021    ASSESSMENT & PLAN:  Malignant neoplasm of upper-outer quadrant of left breast in female, estrogen receptor positive ([place]) 12/17/2020: Palpable lump in the upper outer left breast. 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, Her2+ (3+)/ER+ (80%)/PR- (<1%) in the left breast, and invasive ductal carcinoma, Her2+ (3+)/ER+ (80%)/PR+ (3%) in the left axillary lymph node T1CN1A stage Ib  Treatment plan: 1. Neoadjuvant chemotherapy with TCH Perjeta 6 cycles followed by Herceptin Perjeta maintenance versus Kadcyla maintenance (based on  response to neoadjuvant chemo) for 1 year 2. Followed by breast conserving surgery versus mastectomy if possible with targeted node dissection 3. Followed by adjuvant radiation therapy  4.  Followed by adjuvant antiestrogen therapy -------------------------------------------------------------------------------------------------------------------------------------------------- Current treatment: Cycle 1 day 1 [place] Perjeta Echocardiogram 01/10/2021: EF 55 to 60% Labs reviewed, chemo consent obtained, chemo education completed, antiemetics were reviewed  I refilled her prescription for oxycodone as well as Seroquel. Port issues: I discussed with Dr. Donne Hazel who will see her back to evaluate her port issues.  Return to clinic in 1 week for toxicity check    No orders of the defined types were placed in this encounter.  The patient has a good understanding of the overall plan. she agrees with it.  she will call with any problems that may develop before the next visit here.  Total time spent: 30 mins including face to face time and time spent for planning, charting and coordination of care  Rulon Eisenmenger, MD, MPH 01/11/2021  I, Thana Ates, am acting as scribe for Dr. [name].  I have reviewed the above documentation for accuracy and completeness, and I agree with the above.

## 2021-01-11 ENCOUNTER — Inpatient Hospital Stay (HOSPITAL_BASED_OUTPATIENT_CLINIC_OR_DEPARTMENT_OTHER): Payer: Medicaid Other | Admitting: Hematology and Oncology

## 2021-01-11 ENCOUNTER — Inpatient Hospital Stay: Payer: Medicaid Other

## 2021-01-11 ENCOUNTER — Encounter: Payer: Self-pay | Admitting: *Deleted

## 2021-01-11 VITALS — BP 147/102 | HR 99 | Temp 98.9°F | Resp 18

## 2021-01-11 DIAGNOSIS — C50412 Malignant neoplasm of upper-outer quadrant of left female breast: Secondary | ICD-10-CM | POA: Diagnosis not present

## 2021-01-11 DIAGNOSIS — Z170 Estrogen receptor positive status [ER+]: Secondary | ICD-10-CM

## 2021-01-11 DIAGNOSIS — Z5111 Encounter for antineoplastic chemotherapy: Secondary | ICD-10-CM | POA: Diagnosis not present

## 2021-01-11 DIAGNOSIS — E860 Dehydration: Secondary | ICD-10-CM | POA: Diagnosis not present

## 2021-01-11 DIAGNOSIS — Z5112 Encounter for antineoplastic immunotherapy: Secondary | ICD-10-CM | POA: Diagnosis not present

## 2021-01-11 DIAGNOSIS — Z95828 Presence of other vascular implants and grafts: Secondary | ICD-10-CM

## 2021-01-11 DIAGNOSIS — Z7689 Persons encountering health services in other specified circumstances: Secondary | ICD-10-CM | POA: Diagnosis not present

## 2021-01-11 LAB — CMP (CANCER CENTER ONLY)
ALT: 28 U/L (ref 0–44)
AST: 16 U/L (ref 15–41)
Albumin: 3.7 g/dL (ref 3.5–5.0)
Alkaline Phosphatase: 60 U/L (ref 38–126)
Anion gap: 8 (ref 5–15)
BUN: 17 mg/dL (ref 6–20)
CO2: 24 mmol/L (ref 22–32)
Calcium: 9 mg/dL (ref 8.9–10.3)
Chloride: 109 mmol/L (ref 98–111)
Creatinine: 1.09 mg/dL — ABNORMAL HIGH (ref 0.44–1.00)
GFR, Estimated: >60 mL/min (ref >60)
Glucose, Bld: 98 mg/dL (ref 70–99)
Potassium: 4.3 mmol/L (ref 3.5–5.1)
Sodium: 141 mmol/L (ref 135–145)
Total Bilirubin: <0.2 mg/dL — ABNORMAL LOW (ref 0.3–1.2)
Total Protein: 6.7 g/dL (ref 6.5–8.1)

## 2021-01-11 LAB — CBC WITH DIFFERENTIAL (CANCER CENTER ONLY)
Abs Immature Granulocytes: 0.02 10*3/uL (ref 0.00–0.07)
Basophils Absolute: 0 10*3/uL (ref 0.0–0.1)
Basophils Relative: 1 %
Eosinophils Absolute: 0.3 10*3/uL (ref 0.0–0.5)
Eosinophils Relative: 5 %
HCT: 34.6 % — ABNORMAL LOW (ref 36.0–46.0)
Hemoglobin: 11.3 g/dL — ABNORMAL LOW (ref 12.0–15.0)
Immature Granulocytes: 0 %
Lymphocytes Relative: 34 %
Lymphs Abs: 2.2 10*3/uL (ref 0.7–4.0)
MCH: 28.5 pg (ref 26.0–34.0)
MCHC: 32.7 g/dL (ref 30.0–36.0)
MCV: 87.2 fL (ref 80.0–100.0)
Monocytes Absolute: 0.3 10*3/uL (ref 0.1–1.0)
Monocytes Relative: 4 %
Neutro Abs: 3.6 10*3/uL (ref 1.7–7.7)
Neutrophils Relative %: 56 %
Platelet Count: 269 10*3/uL (ref 150–400)
RBC: 3.97 MIL/uL (ref 3.87–5.11)
RDW: 13.9 % (ref 11.5–15.5)
WBC Count: 6.4 10*3/uL (ref 4.0–10.5)
nRBC: 0 % (ref 0.0–0.2)

## 2021-01-11 MED ORDER — SODIUM CHLORIDE 0.9 % IV SOLN: Freq: Once | INTRAVENOUS | Status: AC

## 2021-01-11 MED ORDER — PALONOSETRON HCL INJECTION 0.25 MG/5ML
0.2500 mg | Freq: Once | INTRAVENOUS | Status: AC
  Administered 2021-01-11: 0.25 mg via INTRAVENOUS
  Filled 2021-01-11: qty 5

## 2021-01-11 MED ORDER — DEXAMETHASONE 4 MG PO TABS: 4.0000 mg | ORAL_TABLET | Freq: Every day | ORAL | 0 refills | Status: DC

## 2021-01-11 MED ORDER — SODIUM CHLORIDE 0.9 % IV SOLN
700.0000 mg | Freq: Once | INTRAVENOUS | Status: AC
  Administered 2021-01-11: 700 mg via INTRAVENOUS
  Filled 2021-01-11: qty 70

## 2021-01-11 MED ORDER — SODIUM CHLORIDE 0.9 % IV SOLN
150.0000 mg | Freq: Once | INTRAVENOUS | Status: AC
  Administered 2021-01-11: 150 mg via INTRAVENOUS
  Filled 2021-01-11: qty 150

## 2021-01-11 MED ORDER — SODIUM CHLORIDE 0.9 % IV SOLN
10.0000 mg | Freq: Once | INTRAVENOUS | Status: AC
  Administered 2021-01-11: 10 mg via INTRAVENOUS
  Filled 2021-01-11: qty 10

## 2021-01-11 MED ORDER — OXYCODONE HCL 15 MG PO TABS: 15.0000 mg | ORAL_TABLET | Freq: Three times a day (TID) | ORAL | 0 refills | Status: DC | PRN

## 2021-01-11 MED ORDER — SODIUM CHLORIDE 0.9 % IV SOLN
420.0000 mg | Freq: Once | INTRAVENOUS | Status: AC
  Administered 2021-01-11: 420 mg via INTRAVENOUS
  Filled 2021-01-11: qty 14

## 2021-01-11 MED ORDER — TRASTUZUMAB-DKST CHEMO 150 MG IV SOLR
600.0000 mg | Freq: Once | INTRAVENOUS | Status: AC
  Administered 2021-01-11: 600 mg via INTRAVENOUS
  Filled 2021-01-11: qty 28.57

## 2021-01-11 MED ORDER — SODIUM CHLORIDE 0.9 % IV SOLN
75.0000 mg/m2 | Freq: Once | INTRAVENOUS | Status: AC
  Administered 2021-01-11: 130 mg via INTRAVENOUS
  Filled 2021-01-11: qty 13

## 2021-01-11 MED ORDER — DIPHENHYDRAMINE HCL 25 MG PO CAPS
25.0000 mg | ORAL_CAPSULE | Freq: Once | ORAL | Status: AC
  Administered 2021-01-11: 25 mg via ORAL
  Filled 2021-01-11: qty 1

## 2021-01-11 MED ORDER — QUETIAPINE FUMARATE 100 MG PO TABS: 100.0000 mg | ORAL_TABLET | Freq: Every day | ORAL | 3 refills | Status: DC

## 2021-01-11 MED ORDER — ACETAMINOPHEN 325 MG PO TABS
650.0000 mg | ORAL_TABLET | Freq: Once | ORAL | Status: AC
  Administered 2021-01-11: 650 mg via ORAL
  Filled 2021-01-11: qty 2

## 2021-01-11 MED ORDER — ZOLPIDEM TARTRATE 5 MG PO TABS: 5.0000 mg | ORAL_TABLET | Freq: Every evening | ORAL | 3 refills | Status: DC | PRN

## 2021-01-11 MED ORDER — SODIUM CHLORIDE 0.9% FLUSH
10.0000 mL | INTRAVENOUS | Status: DC | PRN
  Administered 2021-01-11: 10 mL via INTRAVENOUS

## 2021-01-11 NOTE — Assessment & Plan Note (Signed)
12/17/2020: Palpable lump in the upper outer left breast.Diagnostic mammogram and [place] on08/17/22showed multiple suspicious masses, 2abnormal lymph nodes with increased cortical thickness,1 lymph node with borderline abnormal cortical thickness,Biopsy on08/19/22showed invasive ductal carcinoma and DCIS, Her2+ (3+)/ER+ (80%)/PR- (<1%) in the left breast, and invasive ductal carcinoma, Her2+ (3+)/ER+ (80%)/PR+ (3%) in the left axillary lymph node T1CN1A stage Ib  Treatment plan: 1. Neoadjuvant chemotherapy with TCH Perjeta 6 cycles followed by Herceptin Perjeta maintenance versus Kadcyla maintenance (based on response to neoadjuvant chemo) for 1 year 2. Followed by breast conserving surgery versus mastectomy if possible with targeted node dissection 3. Followed by adjuvant radiation therapy  4.  Followed by adjuvant antiestrogen therapy -------------------------------------------------------------------------------------------------------------------------------------------------- Current treatment: Cycle 1 day 1 TCH Perjeta Echocardiogram 01/10/2021: EF 55 to 60% Labs reviewed, chemo consent obtained, chemo education completed, antiemetics were reviewed Return to clinic in 1 week for toxicity check

## 2021-01-11 NOTE — Progress Notes (Signed)
Unable to use patient's port. After accessing it, it no longer flushed and there was no blood return. The pt didn't complain of any pain. There seems to be a loop in the catheter according to the imaging note "The catheter portion is noted to loop in the soft tissues of the neck. The tip of the catheter terminates in the SVC." Dr. Lindi Adie aware of the complications today, and pt was sent to lab for peripheral lab draw.

## 2021-01-11 NOTE — Patient Instructions (Signed)
[facility]  Discharge Instructions: Thank you for choosing [facility] to provide your oncology and hematology care.   If you have a lab appointment with the [facility], please go directly to the [facility] and check in at the registration area.   Wear comfortable clothing and clothing appropriate for easy access to any Portacath or PICC line.   We strive to give you quality time with your provider. You may need to reschedule your appointment if you arrive late (15 or more minutes).  Arriving late affects you and other patients whose appointments are after yours.  Also, if you miss three or more appointments without notifying the office, you may be dismissed from the clinic at the provider's discretion.      For prescription refill requests, have your pharmacy contact our office and allow 72 hours for refills to be completed.    Today you received the following chemotherapy and/or immunotherapy agents : Trastuzumab, Pertuzumab, Taxotere, Carboplatin      To help prevent nausea and vomiting after your treatment, we encourage you to take your nausea medication as directed.  BELOW ARE SYMPTOMS THAT SHOULD BE REPORTED IMMEDIATELY: *FEVER GREATER THAN 100.4 F (38 C) OR HIGHER *CHILLS OR SWEATING *NAUSEA AND VOMITING THAT IS NOT CONTROLLED WITH YOUR NAUSEA MEDICATION *UNUSUAL SHORTNESS OF BREATH *UNUSUAL BRUISING OR BLEEDING *URINARY PROBLEMS (pain or burning when urinating, or frequent urination) *BOWEL PROBLEMS (unusual diarrhea, constipation, pain near the anus) TENDERNESS IN MOUTH AND THROAT WITH OR WITHOUT PRESENCE OF ULCERS (sore throat, sores in mouth, or a toothache) UNUSUAL RASH, SWELLING OR PAIN  UNUSUAL VAGINAL DISCHARGE OR ITCHING   Items with * indicate a potential emergency and should be followed up as soon as possible or go to the Emergency Department if any problems should occur.  Please show the CHEMOTHERAPY ALERT CARD or  IMMUNOTHERAPY ALERT CARD at check-in to the Emergency Department and triage nurse.  Should you have questions after your visit or need to cancel or reschedule your appointment, please contact [facility]  Dept: [phone]  and follow the prompts.  Office hours are 8:00 a.m. to 4:30 p.m. Monday - Friday. Please note that voicemails left after 4:00 p.m. may not be returned until the following business day.  We are closed weekends and major holidays. You have access to a nurse at all times for urgent questions. Please call the main number to the clinic Dept: [phone] and follow the prompts.   For any non-urgent questions, you may also contact your provider using MyChart. We now offer e-Visits for anyone 4 and older to request care online for non-urgent symptoms. For details visit [email or web address].   Also download the MyChart app! Go to the app store, search "MyChart", open the app, select [facility], and log in with your MyChart username and password.  Due to Covid, a mask is required upon entering the hospital/clinic. If you do not have a mask, one will be given to you upon arrival. For doctor visits, patients may have 1 support person aged 36 or older with them. For treatment visits, patients cannot have anyone with them due to current Covid guidelines and our immunocompromised population.  Trastuzumab; Hyaluronidase injection What is this medication? TRASTUZUMAB; HYALURONIDASE (tras TOO zoo mab / hye al ur ON i dase) is used to treat breast cancer and stomach cancer. Trastuzumab is a monoclonal antibody. Hyaluronidase is used to improve the effects of trastuzumab. This medicine may  be used for other purposes; ask your health care provider or pharmacist if you have questions. COMMON BRAND NAME(S): HERCEPTIN HYLECTA What should I tell my care team before I take this medication? They need to know if you have any of these conditions: heart disease heart  failure lung or breathing disease, like asthma an unusual or allergic reaction to trastuzumab, or other medications, foods, dyes, or preservatives pregnant or trying to get pregnant breast-feeding How should I use this medication? This medicine is for injection under the skin. It is given by a health care professional in a hospital or clinic setting. Talk to your pediatrician regarding the use of this medicine in children. This medicine is not approved for use in children. Overdosage: If you think you have taken too much of this medicine contact a poison control center or emergency room at once. NOTE: This medicine is only for you. Do not share this medicine with others. What if I miss a dose? It is important not to miss a dose. Call your doctor or health care professional if you are unable to keep an appointment. What may interact with this medication? This medicine may interact with the following medications: certain types of chemotherapy, such as daunorubicin, doxorubicin, epirubicin, and idarubicin This list may not describe all possible interactions. Give your health care provider a list of all the medicines, herbs, non-prescription drugs, or dietary supplements you use. Also tell them if you smoke, drink alcohol, or use illegal drugs. Some items may interact with your medicine. What should I watch for while using this medication? Visit your doctor for checks on your progress. Report any side effects. Continue your course of treatment even though you feel ill unless your doctor tells you to stop. Call your doctor or health care professional for advice if you get a fever, chills or sore throat, or other symptoms of a cold or flu. Do not treat yourself. Try to avoid being around people who are sick. You may experience fever, chills and shaking during your first infusion. These effects are usually mild and can be treated with other medicines. Report any side effects during the infusion to your  health care professional. Fever and chills usually do not happen with later infusions. Do not become pregnant while taking this medicine or for 7 months after stopping it. Women should inform their doctor if they wish to become pregnant or think they might be pregnant. Women of child-bearing potential will need to have a negative pregnancy test before starting this medicine. There is a potential for serious side effects to an unborn child. Talk to your health care professional or pharmacist for more information. Do not breast-feed an infant while taking this medicine or for 7 months after stopping it. What side effects may I notice from receiving this medication? Side effects that you should report to your doctor or health care professional as soon as possible: allergic reactions like skin rash, itching or hives, swelling of the face, lips, or tongue breathing problems chest pain or palpitations cough fever general ill feeling or flu-like symptoms signs of worsening heart failure like breathing problems; swelling in your legs and feet Side effects that usually do not require medical attention (report these to your doctor or health care professional if they continue or are bothersome): bone pain changes in taste diarrhea joint pain nausea/vomiting unusually weak or tired weight loss This list may not describe all possible side effects. Call your doctor for medical advice about side effects. You may  report side effects to FDA at 1-800-FDA-1088. Where should I keep my medication? This drug is given in a hospital or clinic and will not be stored at home. NOTE: This sheet is a summary. It may not cover all possible information. If you have questions about this medicine, talk to your doctor, pharmacist, or health care provider.  2022 Elsevier/Gold Standard (2017-07-06 21:54:17)  Pertuzumab injection What is this medication? PERTUZUMAB (per TOOZ ue mab) is a monoclonal antibody. It is used to  treat breast cancer. This medicine may be used for other purposes; ask your health care provider or pharmacist if you have questions. COMMON BRAND NAME(S): PERJETA What should I tell my care team before I take this medication? They need to know if you have any of these conditions: heart disease heart failure high blood pressure history of irregular heart beat recent or ongoing radiation therapy an unusual or allergic reaction to pertuzumab, other medicines, foods, dyes, or preservatives pregnant or trying to get pregnant breast-feeding How should I use this medication? This medicine is for infusion into a vein. It is given by a health care professional in a hospital or clinic setting. Talk to your pediatrician regarding the use of this medicine in children. Special care may be needed. Overdosage: If you think you have taken too much of this medicine contact a poison control center or emergency room at once. NOTE: This medicine is only for you. Do not share this medicine with others. What if I miss a dose? It is important not to miss your dose. Call your doctor or health care professional if you are unable to keep an appointment. What may interact with this medication? Interactions are not expected. Give your health care provider a list of all the medicines, herbs, non-prescription drugs, or dietary supplements you use. Also tell them if you smoke, drink alcohol, or use illegal drugs. Some items may interact with your medicine. This list may not describe all possible interactions. Give your health care provider a list of all the medicines, herbs, non-prescription drugs, or dietary supplements you use. Also tell them if you smoke, drink alcohol, or use illegal drugs. Some items may interact with your medicine. What should I watch for while using this medication? Your condition will be monitored carefully while you are receiving this medicine. Report any side effects. Continue your course of  treatment even though you feel ill unless your doctor tells you to stop. Do not become pregnant while taking this medicine or for 7 months after stopping it. Women should inform their doctor if they wish to become pregnant or think they might be pregnant. Women of child-bearing potential will need to have a negative pregnancy test before starting this medicine. There is a potential for serious side effects to an unborn child. Talk to your health care professional or pharmacist for more information. Do not breast-feed an infant while taking this medicine or for 7 months after stopping it. Women must use effective birth control with this medicine. Call your doctor or health care professional for advice if you get a fever, chills or sore throat, or other symptoms of a cold or flu. Do not treat yourself. Try to avoid being around people who are sick. You may experience fever, chills, and headache during the infusion. Report any side effects during the infusion to your health care professional. What side effects may I notice from receiving this medication? Side effects that you should report to your doctor or health care professional as soon  as possible: breathing problems chest pain or palpitations dizziness feeling faint or lightheaded fever or chills skin rash, itching or hives sore throat swelling of the face, lips, or tongue swelling of the legs or ankles unusually weak or tired Side effects that usually do not require medical attention (report to your doctor or health care professional if they continue or are bothersome): diarrhea hair loss nausea, vomiting tiredness This list may not describe all possible side effects. Call your doctor for medical advice about side effects. You may report side effects to FDA at 1-800-FDA-1088. Where should I keep my medication? This drug is given in a hospital or clinic and will not be stored at home. NOTE: This sheet is a summary. It may not cover all  possible information. If you have questions about this medicine, talk to your doctor, pharmacist, or health care provider.  2022 Elsevier/Gold Standard (2015-05-20 12:08:50)  Docetaxel injection What is this medication? DOCETAXEL (doe se TAX el) is a chemotherapy drug. It targets fast dividing cells, like cancer cells, and causes these cells to die. This medicine is used to treat many types of cancers like breast cancer, certain stomach cancers, head and neck cancer, lung cancer, and prostate cancer. This medicine may be used for other purposes; ask your health care provider or pharmacist if you have questions. COMMON BRAND NAME(S): Docefrez, Taxotere What should I tell my care team before I take this medication? They need to know if you have any of these conditions: infection (especially a virus infection such as chickenpox, cold sores, or herpes) liver disease low blood counts, like low white cell, platelet, or red cell counts an unusual or allergic reaction to docetaxel, polysorbate 80, other chemotherapy agents, other medicines, foods, dyes, or preservatives pregnant or trying to get pregnant breast-feeding How should I use this medication? This drug is given as an infusion into a vein. It is administered in a hospital or clinic by a specially trained health care professional. Talk to your pediatrician regarding the use of this medicine in children. Special care may be needed. Overdosage: If you think you have taken too much of this medicine contact a poison control center or emergency room at once. NOTE: This medicine is only for you. Do not share this medicine with others. What if I miss a dose? It is important not to miss your dose. Call your doctor or health care professional if you are unable to keep an appointment. What may interact with this medication? Do not take this medicine with any of the following medications: live virus vaccines This medicine may also interact with the  following medications: aprepitant certain antibiotics like erythromycin or clarithromycin certain antivirals for HIV or hepatitis certain medicines for fungal infections like fluconazole, itraconazole, ketoconazole, posaconazole, or voriconazole cimetidine ciprofloxacin conivaptan cyclosporine dronedarone fluvoxamine grapefruit juice imatinib verapamil This list may not describe all possible interactions. Give your health care provider a list of all the medicines, herbs, non-prescription drugs, or dietary supplements you use. Also tell them if you smoke, drink alcohol, or use illegal drugs. Some items may interact with your medicine. What should I watch for while using this medication? Your condition will be monitored carefully while you are receiving this medicine. You will need important blood work done while you are taking this medicine. Call your doctor or health care professional for advice if you get a fever, chills or sore throat, or other symptoms of a cold or flu. Do not treat yourself. This drug decreases your body's  ability to fight infections. Try to avoid being around people who are sick. Some products may contain alcohol. Ask your health care professional if this medicine contains alcohol. Be sure to tell all health care professionals you are taking this medicine. Certain medicines, like metronidazole and disulfiram, can cause an unpleasant reaction when taken with alcohol. The reaction includes flushing, headache, nausea, vomiting, sweating, and increased thirst. The reaction can last from 30 minutes to several hours. You may get drowsy or dizzy. Do not drive, use machinery, or do anything that needs mental alertness until you know how this medicine affects you. Do not stand or sit up quickly, especially if you are an older patient. This reduces the risk of dizzy or fainting spells. Alcohol may interfere with the effect of this medicine. Talk to your health care professional about  your risk of cancer. You may be more at risk for certain types of cancer if you take this medicine. Do not become pregnant while taking this medicine or for 6 months after stopping it. Women should inform their doctor if they wish to become pregnant or think they might be pregnant. There is a potential for serious side effects to an unborn child. Talk to your health care professional or pharmacist for more information. Do not breast-feed an infant while taking this medicine or for 1 week after stopping it. Males who get this medicine must use a condom during sex with females who can get pregnant. If you get a woman pregnant, the baby could have birth defects. The baby could die before they are born. You will need to continue wearing a condom for 3 months after stopping the medicine. Tell your health care provider right away if your partner becomes pregnant while you are taking this medicine. This may interfere with the ability to father a child. You should talk to your doctor or health care professional if you are concerned about your fertility. What side effects may I notice from receiving this medication? Side effects that you should report to your doctor or health care professional as soon as possible: allergic reactions like skin rash, itching or hives, swelling of the face, lips, or tongue blurred vision breathing problems changes in vision low blood counts - This drug may decrease the number of white blood cells, red blood cells and platelets. You may be at increased risk for infections and bleeding. nausea and vomiting pain, redness or irritation at site where injected pain, tingling, numbness in the hands or feet redness, blistering, peeling, or loosening of the skin, including inside the mouth signs of decreased platelets or bleeding - bruising, pinpoint red spots on the skin, black, tarry stools, nosebleeds signs of decreased red blood cells - unusually weak or tired, fainting spells,  lightheadedness signs of infection - fever or chills, cough, sore throat, pain or difficulty passing urine swelling of the ankle, feet, hands Side effects that usually do not require medical attention (report to your doctor or health care professional if they continue or are bothersome): constipation diarrhea fingernail or toenail changes hair loss loss of appetite mouth sores muscle pain This list may not describe all possible side effects. Call your doctor for medical advice about side effects. You may report side effects to FDA at 1-800-FDA-1088. Where should I keep my medication? This drug is given in a hospital or clinic and will not be stored at home. NOTE: This sheet is a summary. It may not cover all possible information. If you have questions about this  medicine, talk to your doctor, pharmacist, or health care provider.  2022 Elsevier/Gold Standard (2019-03-17 19:50:31)  Carboplatin injection What is this medication? CARBOPLATIN (KAR boe pla tin) is a chemotherapy drug. It targets fast dividing cells, like cancer cells, and causes these cells to die. This medicine is used to treat ovarian cancer and many other cancers. This medicine may be used for other purposes; ask your health care provider or pharmacist if you have questions. COMMON BRAND NAME(S): Paraplatin What should I tell my care team before I take this medication? They need to know if you have any of these conditions: blood disorders hearing problems kidney disease recent or ongoing radiation therapy an unusual or allergic reaction to carboplatin, cisplatin, other chemotherapy, other medicines, foods, dyes, or preservatives pregnant or trying to get pregnant breast-feeding How should I use this medication? This drug is usually given as an infusion into a vein. It is administered in a hospital or clinic by a specially trained health care professional. Talk to your pediatrician regarding the use of this medicine in  children. Special care may be needed. Overdosage: If you think you have taken too much of this medicine contact a poison control center or emergency room at once. NOTE: This medicine is only for you. Do not share this medicine with others. What if I miss a dose? It is important not to miss a dose. Call your doctor or health care professional if you are unable to keep an appointment. What may interact with this medication? medicines for seizures medicines to increase blood counts like filgrastim, pegfilgrastim, sargramostim some antibiotics like amikacin, gentamicin, neomycin, streptomycin, tobramycin vaccines Talk to your doctor or health care professional before taking any of these medicines: acetaminophen aspirin ibuprofen ketoprofen naproxen This list may not describe all possible interactions. Give your health care provider a list of all the medicines, herbs, non-prescription drugs, or dietary supplements you use. Also tell them if you smoke, drink alcohol, or use illegal drugs. Some items may interact with your medicine. What should I watch for while using this medication? Your condition will be monitored carefully while you are receiving this medicine. You will need important blood work done while you are taking this medicine. This drug may make you feel generally unwell. This is not uncommon, as chemotherapy can affect healthy cells as well as cancer cells. Report any side effects. Continue your course of treatment even though you feel ill unless your doctor tells you to stop. In some cases, you may be given additional medicines to help with side effects. Follow all directions for their use. Call your doctor or health care professional for advice if you get a fever, chills or sore throat, or other symptoms of a cold or flu. Do not treat yourself. This drug decreases your body's ability to fight infections. Try to avoid being around people who are sick. This medicine may increase your  risk to bruise or bleed. Call your doctor or health care professional if you notice any unusual bleeding. Be careful brushing and flossing your teeth or using a toothpick because you may get an infection or bleed more easily. If you have any dental work done, tell your dentist you are receiving this medicine. Avoid taking products that contain aspirin, acetaminophen, ibuprofen, naproxen, or ketoprofen unless instructed by your doctor. These medicines may hide a fever. Do not become pregnant while taking this medicine. Women should inform their doctor if they wish to become pregnant or think they might be pregnant. There is  a potential for serious side effects to an unborn child. Talk to your health care professional or pharmacist for more information. Do not breast-feed an infant while taking this medicine. What side effects may I notice from receiving this medication? Side effects that you should report to your doctor or health care professional as soon as possible: allergic reactions like skin rash, itching or hives, swelling of the face, lips, or tongue signs of infection - fever or chills, cough, sore throat, pain or difficulty passing urine signs of decreased platelets or bleeding - bruising, pinpoint red spots on the skin, black, tarry stools, nosebleeds signs of decreased red blood cells - unusually weak or tired, fainting spells, lightheadedness breathing problems changes in hearing changes in vision chest pain high blood pressure low blood counts - This drug may decrease the number of white blood cells, red blood cells and platelets. You may be at increased risk for infections and bleeding. nausea and vomiting pain, swelling, redness or irritation at the injection site pain, tingling, numbness in the hands or feet problems with balance, talking, walking trouble passing urine or change in the amount of urine Side effects that usually do not require medical attention (report to your  doctor or health care professional if they continue or are bothersome): hair loss loss of appetite metallic taste in the mouth or changes in taste This list may not describe all possible side effects. Call your doctor for medical advice about side effects. You may report side effects to FDA at 1-800-FDA-1088. Where should I keep my medication? This drug is given in a hospital or clinic and will not be stored at home. NOTE: This sheet is a summary. It may not cover all possible information. If you have questions about this medicine, talk to your doctor, pharmacist, or health care provider.  2022 Elsevier/Gold Standard (2007-07-23 14:38:05)

## 2021-01-12 ENCOUNTER — Encounter: Payer: Self-pay | Admitting: Hematology and Oncology

## 2021-01-12 NOTE — Progress Notes (Signed)
Returned call to patient whom left voicemail regarding applying for [employer].  Introduced myself as Arboriculturist and discussed one-time Multimedia programmer to assist with personal expenses while going through treatment. Advised what is needed to apply. She will bring documentation tomorrow 9/15 and call me after her injection to complete process.  She has my card for any additional financial questions or concerns.

## 2021-01-13 ENCOUNTER — Inpatient Hospital Stay: Payer: Medicaid Other

## 2021-01-13 ENCOUNTER — Encounter: Payer: Self-pay | Admitting: Licensed Clinical Social Worker

## 2021-01-13 ENCOUNTER — Inpatient Hospital Stay (HOSPITAL_BASED_OUTPATIENT_CLINIC_OR_DEPARTMENT_OTHER): Payer: Medicaid Other | Admitting: Genetic Counselor

## 2021-01-13 ENCOUNTER — Other Ambulatory Visit: Payer: Self-pay | Admitting: General Surgery

## 2021-01-13 ENCOUNTER — Other Ambulatory Visit: Payer: Self-pay | Admitting: Genetic Counselor

## 2021-01-13 ENCOUNTER — Other Ambulatory Visit: Payer: Self-pay

## 2021-01-13 ENCOUNTER — Ambulatory Visit: Payer: Medicaid Other

## 2021-01-13 VITALS — BP 134/94 | HR 93 | Temp 99.1°F | Resp 18

## 2021-01-13 DIAGNOSIS — Z5112 Encounter for antineoplastic immunotherapy: Secondary | ICD-10-CM | POA: Diagnosis not present

## 2021-01-13 DIAGNOSIS — C50412 Malignant neoplasm of upper-outer quadrant of left female breast: Secondary | ICD-10-CM

## 2021-01-13 DIAGNOSIS — Z170 Estrogen receptor positive status [ER+]: Secondary | ICD-10-CM

## 2021-01-13 DIAGNOSIS — Z803 Family history of malignant neoplasm of breast: Secondary | ICD-10-CM | POA: Diagnosis not present

## 2021-01-13 LAB — GENETIC SCREENING ORDER

## 2021-01-13 MED ORDER — PEGFILGRASTIM-CBQV 6 MG/0.6ML ~~LOC~~ SOSY
6.0000 mg | PREFILLED_SYRINGE | Freq: Once | SUBCUTANEOUS | Status: AC
  Administered 2021-01-13: 6 mg via SUBCUTANEOUS
  Filled 2021-01-13: qty 0.6

## 2021-01-13 NOTE — Patient Instructions (Signed)

## 2021-01-13 NOTE — Progress Notes (Signed)
[facility] CSW Progress Note  Holiday representative received request from [employer] to contact pt re: financial resources. Patient was most recently employed with [employer] for about 1 month when she was diagnosed. Unfortunately, she is not eligible for FMLA or short-term disability benefits because of this. She has an appt with the [employer]SA in October to apply for disability and has tried for unemployment as well.  CSW provided information on breast cancer foundations that may be able to provide one time assistance as well. E-mailed to pt per her request.    Christeen Douglas , LCSW

## 2021-01-17 NOTE — Progress Notes (Signed)
Patient Care Team: [name], DO as PCP - General (Internal Medicine) Claudia Pollock, RN as Registered Nurse Dayna Barker, MD as Consulting Physician (General Surgery) Mauro Kaufmann, RN as Oncology Nurse Navigator Rockwell Germany, RN as Oncology Nurse Navigator  DIAGNOSIS:    ICD-10-CM   1. Malignant neoplasm of upper-outer quadrant of left breast in female, estrogen receptor positive ([place])  C50.412    Z17.0       SUMMARY OF ONCOLOGIC HISTORY: Oncology History  Left breast lump (Resolved)  Malignant neoplasm of upper-outer quadrant of left breast in female, estrogen receptor positive ([place])  12/17/2020 Initial Diagnosis   Palpable lump in the upper outer left breast. 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, Her2+ (3+)/ER+ (80%)/PR- (<1%) in the left breast, and invasive ductal carcinoma, Her2+ (3+)/ER+ (80%)/PR+ (3%) in the left axillary lymph node   12/23/2020 Cancer Staging   Staging form: Breast, AJCC 8th Edition - Clinical stage from 12/23/2020: Stage IB (cT2, cN1, cM0, G3, ER+, PR+, HER2+) - Signed by [name], MD on 12/23/2020 Stage prefix: Initial diagnosis Histologic grading system: 3 grade system   01/11/2021 -  Chemotherapy    Patient is on Treatment Plan: BREAST  DOCETAXEL + CARBOPLATIN + TRASTUZUMAB + PERTUZUMAB  (TCHP) Q21D          CHIEF COMPLIANT: Cycle 2 TCH Perjeta   INTERVAL HISTORY: Robin Horn is a 35 y.o. with above-mentioned history of left breast cancer, currently on chemotherapy with [place]. She presents to the clinic today for treatment.  After chemotherapy she developed profound soreness in the mouth which led to decreased taste and appetite and she has not been eating much food.  In fact she lost 10 pounds in 1 week.  She  feels dizzy and lightheaded today.  She did not have any nausea vomiting or diarrhea.  She did have mild constipation for which she took a stool softener.  ALLERGIES:  is allergic to hydrocodone-acetaminophen and lactose intolerance (gi).  MEDICATIONS:  Current Outpatient Medications  Medication Sig Dispense Refill   BELBUCA 750 MCG FILM Take 1 strip by mouth 2 (two) times daily.     butalbital-acetaminophen-caffeine (FIORICET) 50-325-40 MG tablet Take 1 tablet by mouth 2 (two) times daily as needed.     dexamethasone (DECADRON) 4 MG tablet Take 1 tablet (4 mg total) by mouth daily. Take 1 tablet day before chemo and 1 tablet day after chemo with food 12 tablet 0   lidocaine-prilocaine (EMLA) cream Apply to affected area once 30 g 3   lisinopril (ZESTRIL) 10 MG tablet Take 10 mg by mouth daily.     ondansetron (ZOFRAN) 8 MG tablet Take 1 tablet (8 mg total) by mouth 2 (two) times daily as needed (Nausea or vomiting). Start on the third day after chemotherapy. 30 tablet 1   oxyCODONE (ROXICODONE) 15 MG immediate release tablet Take 1 tablet (15 mg total) by mouth every 8 (eight) hours as needed for pain. 60 tablet 0   prochlorperazine (COMPAZINE) 10 MG tablet Take 1 tablet (10 mg total) by mouth every 6 (six) hours as needed (Nausea or vomiting). 30 tablet 1   QUEtiapine (SEROQUEL) 100 MG tablet Take 1 tablet (100 mg total) by mouth at bedtime. 30 tablet 3   zolpidem (AMBIEN) 5 MG tablet Take 1 tablet (5  mg total) by mouth at bedtime as needed. 30 tablet 3   No current facility-administered medications for this visit.    PHYSICAL EXAMINATION: ECOG PERFORMANCE STATUS: 1 - Symptomatic but completely ambulatory  Vitals:   01/18/21 1359  BP: 140/83  Pulse: (!) 112  Resp: 18  Temp: 97.8 F (36.6 C)  SpO2: 100%   Filed Weights   01/18/21 1359  Weight: 155 lb 9.6 oz (70.6 kg)    LABORATORY DATA:  I have reviewed the data as listed CMP Latest Ref Rng & Units 01/11/2021 01/06/2021  09/18/2018  Glucose 70 - 99 mg/dL 98 89 95  BUN 6 - 20 mg/dL 17 16 9   Creatinine 0.44 - 1.00 mg/dL 1.09(H) 1.28(H) 0.90  Sodium 135 - 145 mmol/L 141 137 139  Potassium 3.5 - 5.1 mmol/L 4.3 3.5 3.5  Chloride 98 - 111 mmol/L 109 106 102  CO2 22 - 32 mmol/L 24 19(L) 26  Calcium 8.9 - 10.3 mg/dL 9.0 9.0 9.7  Total Protein 6.5 - 8.1 g/dL 6.7 - -  Total Bilirubin 0.3 - 1.2 mg/dL <0.2(L) - -  Alkaline Phos 38 - 126 U/L 60 - -  AST 15 - 41 U/L 16 - -  ALT 0 - 44 U/L 28 - -    Lab Results  Component Value Date   WBC 13.2 (H) 01/18/2021   HGB 13.0 01/18/2021   HCT 39.2 01/18/2021   MCV 85.6 01/18/2021   PLT 263 01/18/2021   NEUTROABS PENDING 01/18/2021    ASSESSMENT & PLAN:  Malignant neoplasm of upper-outer quadrant of left breast in female, estrogen receptor positive ([place]) 12/17/2020: Palpable lump in the upper outer left breast. 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 Neoadjuvant chemotherapy with TCH Perjeta 6 cycles followed by Herceptin Perjeta maintenance versus Kadcyla maintenance (based on response to neoadjuvant chemo) for 1 year 2. Followed by breast conserving surgery versus mastectomy if possible with targeted node dissection 3. Followed by adjuvant radiation therapy  4.  Followed by adjuvant antiestrogen therapy -------------------------------------------------------------------------------------------------------------------------------------------------- Current treatment: Cycle 1 day 8 TCH Perjeta Echocardiogram 01/10/2021: EF 55 to 60  Chemo toxicities: Severe sores in the mouth: I  will send a prescription for Magic mouthwash. Severe fatigue Poor appetite and dehydration: We will try to give her 1 L of normal saline. We will reduce the dosage of cycle 2 of her chemotherapy because of the profound fatigue that she has been experiencing and also profound loss of appetite Return to clinic in 2 weeks for cycle 2    No orders of the defined types were placed in this encounter.  The patient has a good understanding of the overall plan. she agrees with it. she will call with any problems that may develop before the next visit here.  Total time spent: 30 mins including face to face time and time spent for planning, charting and coordination of care  Rulon Eisenmenger, MD, MPH 01/18/2021  I, Thana Ates, am acting as scribe for Dr. [name].  I have reviewed the above documentation for accuracy and completeness, and I agree with the above.

## 2021-01-18 ENCOUNTER — Inpatient Hospital Stay (HOSPITAL_BASED_OUTPATIENT_CLINIC_OR_DEPARTMENT_OTHER): Payer: Medicaid Other | Admitting: Hematology and Oncology

## 2021-01-18 ENCOUNTER — Encounter: Payer: Self-pay | Admitting: Hematology and Oncology

## 2021-01-18 ENCOUNTER — Encounter: Payer: Self-pay | Admitting: Genetic Counselor

## 2021-01-18 ENCOUNTER — Other Ambulatory Visit: Payer: Self-pay | Admitting: *Deleted

## 2021-01-18 ENCOUNTER — Inpatient Hospital Stay: Payer: Medicaid Other

## 2021-01-18 ENCOUNTER — Encounter: Payer: Self-pay | Admitting: *Deleted

## 2021-01-18 ENCOUNTER — Other Ambulatory Visit: Payer: Self-pay

## 2021-01-18 ENCOUNTER — Encounter: Payer: Self-pay | Admitting: Physical Medicine and Rehabilitation

## 2021-01-18 VITALS — BP 141/94 | HR 94 | Resp 17

## 2021-01-18 DIAGNOSIS — Z95828 Presence of other vascular implants and grafts: Secondary | ICD-10-CM | POA: Insufficient documentation

## 2021-01-18 DIAGNOSIS — C50412 Malignant neoplasm of upper-outer quadrant of left female breast: Secondary | ICD-10-CM | POA: Diagnosis not present

## 2021-01-18 DIAGNOSIS — Z803 Family history of malignant neoplasm of breast: Secondary | ICD-10-CM

## 2021-01-18 DIAGNOSIS — Z170 Estrogen receptor positive status [ER+]: Secondary | ICD-10-CM

## 2021-01-18 DIAGNOSIS — Z5112 Encounter for antineoplastic immunotherapy: Secondary | ICD-10-CM | POA: Diagnosis not present

## 2021-01-18 HISTORY — DX: Family history of malignant neoplasm of breast: Z80.3

## 2021-01-18 LAB — CMP (CANCER CENTER ONLY)
ALT: 20 U/L (ref 0–44)
AST: 16 U/L (ref 15–41)
Albumin: 4.4 g/dL (ref 3.5–5.0)
Alkaline Phosphatase: 98 U/L (ref 38–126)
Anion gap: 10 (ref 5–15)
BUN: 14 mg/dL (ref 6–20)
CO2: 27 mmol/L (ref 22–32)
Calcium: 10.1 mg/dL (ref 8.9–10.3)
Chloride: 101 mmol/L (ref 98–111)
Creatinine: 1.35 mg/dL — ABNORMAL HIGH (ref 0.44–1.00)
GFR, Estimated: 53 mL/min — ABNORMAL LOW (ref >60)
Glucose, Bld: 98 mg/dL (ref 70–99)
Potassium: 4.2 mmol/L (ref 3.5–5.1)
Sodium: 138 mmol/L (ref 135–145)
Total Bilirubin: <0.2 mg/dL — ABNORMAL LOW (ref 0.3–1.2)
Total Protein: 8 g/dL (ref 6.5–8.1)

## 2021-01-18 LAB — CBC WITH DIFFERENTIAL (CANCER CENTER ONLY)
Abs Immature Granulocytes: 0.76 10*3/uL — ABNORMAL HIGH (ref 0.00–0.07)
Basophils Absolute: 0.1 10*3/uL (ref 0.0–0.1)
Basophils Relative: 1 %
Eosinophils Absolute: 0.1 10*3/uL (ref 0.0–0.5)
Eosinophils Relative: 1 %
HCT: 39.2 % (ref 36.0–46.0)
Hemoglobin: 13 g/dL (ref 12.0–15.0)
Immature Granulocytes: 6 %
Lymphocytes Relative: 21 %
Lymphs Abs: 2.7 10*3/uL (ref 0.7–4.0)
MCH: 28.4 pg (ref 26.0–34.0)
MCHC: 33.2 g/dL (ref 30.0–36.0)
MCV: 85.6 fL (ref 80.0–100.0)
Monocytes Absolute: 1.7 10*3/uL — ABNORMAL HIGH (ref 0.1–1.0)
Monocytes Relative: 13 %
Neutro Abs: 7.8 10*3/uL — ABNORMAL HIGH (ref 1.7–7.7)
Neutrophils Relative %: 58 %
Platelet Count: 263 10*3/uL (ref 150–400)
RBC: 4.58 MIL/uL (ref 3.87–5.11)
RDW: 13.3 % (ref 11.5–15.5)
WBC Count: 13.2 10*3/uL — ABNORMAL HIGH (ref 4.0–10.5)
nRBC: 0 % (ref 0.0–0.2)

## 2021-01-18 MED ORDER — SODIUM CHLORIDE 0.9 % IV SOLN: Freq: Once | INTRAVENOUS | Status: AC

## 2021-01-18 MED ORDER — SODIUM CHLORIDE 0.9 % IV SOLN: Freq: Once | INTRAVENOUS | Status: DC

## 2021-01-18 MED ORDER — MAGIC MOUTHWASH W/LIDOCAINE: 5.0000 mL | Freq: Three times a day (TID) | ORAL | 0 refills | Status: DC | PRN

## 2021-01-18 NOTE — Assessment & Plan Note (Signed)
12/17/2020:Palpable lump in the upper outer left breast.Diagnostic mammogram and [place] on08/17/22showed multiple suspicious masses, 2abnormal lymph nodes with increased cortical thickness,1 lymph node with borderline abnormal cortical thickness,Biopsy on08/19/22showed invasive ductal carcinoma and DCIS, Her2+ (3+)/ER+ (80%)/PR- (<1%) in the left breast, and invasive ductal carcinoma, Her2+ (3+)/ER+ (80%)/PR+ (3%) in the left axillary lymph node T1CN1A stage Ib  Treatment plan: 1. Neoadjuvant chemotherapy with TCH Perjeta 6 cycles followed by Herceptin Perjeta maintenance versus Kadcyla maintenance (based on response to neoadjuvant chemo) for 1 year 2. Followed by breast conserving surgeryversus mastectomyif possible with targeted node dissection 3. Followed by adjuvant radiation therapy 4.Followed by adjuvant antiestrogen therapy -------------------------------------------------------------------------------------------------------------------------------------------------- Current treatment: Cycle 1 day 8 TCH Perjeta Echocardiogram 01/10/2021: EF 55 to 60  Chemo toxicities:  Return to clinic in 2 weeks for cycle 2

## 2021-01-18 NOTE — Progress Notes (Signed)
REFERRING PROVIDER: [name], MD [address] [place],  [place] [address]  PRIMARY PROVIDER:  [name], DO  PRIMARY REASON FOR VISIT:  1. Malignant neoplasm of upper-outer quadrant of left breast in female, estrogen receptor positive ([place])   2. Family history of breast cancer     HISTORY OF PRESENT ILLNESS:   Robin Horn, a 35 y.o. female, was seen for a [place] [facility] [place] cancer genetics consultation at the request of Dr. Lindi Adie due to a personal and family history of cancer.  Robin Horn presents to clinic today to discuss the possibility of a hereditary predisposition to cancer, to discuss genetic testing, and to further clarify her future cancer risks, as well as potential cancer risks for family members.   In August 2022, at the age of 66, Robin Horn was diagnosed with invasive ductal carcinoma of the left breast. The treatment plan includes neoadjuvant chemotherapy, surgery, adjuvant radiation, and adjuvant anti-estrogens.     CANCER HISTORY:  Oncology History  Left breast lump (Resolved)  Malignant neoplasm of upper-outer quadrant of left breast in female, estrogen receptor positive ([place])  12/17/2020 Initial Diagnosis   Palpable lump in the upper outer left breast. 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, Her2+ (3+)/ER+ (80%)/PR- (<1%) in the left breast, and invasive ductal carcinoma, Her2+ (3+)/ER+ (80%)/PR+ (3%) in the left axillary lymph node   12/23/2020 Cancer Staging   Staging form: Breast, AJCC 8th Edition - Clinical stage from 12/23/2020: Stage IB (cT2, cN1, cM0, G3, ER+, PR+, HER2+) - Signed by [name], MD on 12/23/2020 Stage prefix: Initial diagnosis Histologic grading system: 3 grade system   01/11/2021 -  Chemotherapy    Patient is on  Treatment Plan: BREAST  DOCETAXEL + CARBOPLATIN + TRASTUZUMAB + PERTUZUMAB  (TCHP) Q21D           RISK FACTORS:  Menarche was at age 89.  First live birth at age 67.  OCP use for approximately 2 years.  Ovaries intact: yes.  Hysterectomy: no.  Menopausal status: premenopausal.  HRT use: 0 years. Colonoscopy: no; not examined. Mammogram within the last year: yes. Up to date with pelvic exams: no. Any excessive radiation exposure in the past: no  Past Medical History:  Diagnosis Date   Abnormal Pap smear of cervix    Anemia    Anxiety    Arthritis    Cancer ([place])    Chlamydia    Depression    Family history of breast cancer 01/18/2021   GDM (gestational diabetes mellitus)    Gonorrhea    Herpes genitalia    History of hiatal hernia    Migraines    Ovarian cyst    Polycystic kidney disease    Preeclampsia    Pregnancy induced hypertension     Past Surgical History:  Procedure Laterality Date   COLPOSCOPY     DILATION AND CURETTAGE OF UTERUS     PORTACATH PLACEMENT N/A 01/06/2021   Procedure: INSERTION PORT-A-CATH;  Surgeon: Rolm Bookbinder, MD;  Location: [facility];  Service: General;  Laterality: N/A;   TONSILLECTOMY     TUBAL LIGATION Bilateral 09/24/2016   Procedure: POST PARTUM TUBAL LIGATION;  Surgeon: Lavonia Drafts, MD;  Location: [facility];  Service: Gynecology;  Laterality: Bilateral;    Social History   Socioeconomic History   Marital status: Single  Spouse name: Not on file   Number of children: Not on file   Years of education: Not on file   Highest education level: Not on file  Occupational History   Not on file  Tobacco Use   Smoking status: Light Smoker    Packs/day: 1.00    Types: Cigarettes   Smokeless tobacco: Never   Tobacco comments:    Wants to restart Nicotine  Vaping Use   Vaping Use: Never used  Substance and Sexual Activity   Alcohol use: Yes    Alcohol/week: 1.0 standard drink    Types: 1 Glasses of wine  per week    Comment: Once every 2 months. Nothing since finding out about pregnancy.    Drug use: No    Types: Cocaine    Comment: none since +preg   Sexual activity: Yes    Birth control/protection: None, Surgical    Comment: Tubal  Other Topics Concern   Not on file  Social History Narrative   Not on file   Social Determinants of Health   Financial Resource Strain: Not on file  Food Insecurity: Not on file  Transportation Needs: Not on file  Physical Activity: Not on file  Stress: Not on file  Social Connections: Not on file     FAMILY HISTORY:  We obtained a detailed, 4-generation family history.  Significant diagnoses are listed below: Family History  Problem Relation Age of Onset   Breast cancer Maternal Aunt        dx 72s   Breast cancer Other        MGM's sister and MGM's niece; dx after 65     Ms. Blakely is unaware of previous family history of genetic testing for hereditary cancer risks. There is no reported Ashkenazi Jewish ancestry. There is no known consanguinity.  GENETIC COUNSELING ASSESSMENT: Robin Horn is a 35 y.o. female with a personal and family history of cancer which is somewhat suggestive of a hereditary cancer syndrome and predisposition to cancer given her age of diagnosis and the presence of related cancers in her maternal family. We, therefore, discussed and recommended the following at today's visit.   DISCUSSION: We discussed that 5 - 10% of cancer is hereditary, with most cases of hereditary breast cancer associated with mutations in BRCA1/2.  There are other genes that can be associated with hereditary breast cancer syndromes.  Type of cancer risk and level of risk are gene-specific.  We discussed that testing is beneficial for several reasons including knowing how to follow individuals after completing their treatment, identifying whether potential treatment options would be beneficial, and understanding if other family members could be at risk for  cancer and allowing them to undergo genetic testing.   We reviewed the characteristics, features and inheritance patterns of hereditary cancer syndromes. We also discussed genetic testing, including the appropriate family members to test, the process of testing, insurance coverage and turn-around-time for results. We discussed the implications of a negative, positive, carrier and/or variant of uncertain significant result. We recommended Ms. Aaberg pursue genetic testing for a panel that includes genes associated with breast cancer.   Ms. Eddleman  was offered a common hereditary cancer panel (47 genes) and an expanded pan-cancer panel (84 genes). Ms. Deremer was informed of the benefits and limitations of each panel, including that expanded pan-cancer panels contain genes that do not have clear management guidelines at this point in time.  We also discussed that as the number of genes included on  a panel increases, the chances of variants of uncertain significance increases.  After considering the benefits and limitations of each gene panel, Ms. Demmer  elected to have a common hereditary cancers panel through [employer].   The Common Hereditary Cancers + RNA Panel offered by [facility] includes sequencing, deletion/duplication, and RNA testing of the following 47 genes: APC, ATM, AXIN2, BARD1, BMPR1A, BRCA1, BRCA2, BRIP1, CDH1, CDK4*, CDKN2A (p14ARF)*, CDKN2A (p16INK4a)*, CHEK2, CTNNA1, DICER1, EPCAM (Deletion/duplication testing only), GREM1 (promoter region deletion/duplication testing only), KIT, MEN1, MLH1, MSH2, MSH3, MSH6, MUTYH, NBN, NF1, NHTL1, PALB2, PDGFRA*, PMS2, POLD1, POLE, PTEN, RAD50, RAD51C, RAD51D, SDHB, SDHC, SDHD, SMAD4, SMARCA4. STK11, TP53, TSC1, TSC2, and VHL.  The following genes were evaluated for sequence changes only: SDHA and HOXB13 c.251G>A variant only.  RNA analysis is not performed for the * genes.    Based on Ms. Neenan's personal history of cancer, she meets medical  criteria for genetic testing. Despite that she meets criteria, she may still have an out of pocket cost. We discussed that if she has an out of pocket cost for testing, the laboratory should reach out to her to discuss self-pay options and patient pay assistance programs.   PLAN: After considering the risks, benefits, and limitations, Ms. Totino provided informed consent to pursue genetic testing and the blood sample was sent to [facility] for analysis of the Common Hereditary Cancers Panel. Results should be available within approximately 3 weeks' time, at which point they will be disclosed by telephone to Ms. Milanes, as will any additional recommendations warranted by these results. Ms. Towle will receive a summary of her genetic counseling visit and a copy of her results once available. This information will also be available in Epic.   Lastly, we encouraged Ms. Bohl to remain in contact with cancer genetics annually so that we can continuously update the family history and inform her of any changes in cancer genetics and testing that may be of benefit for this family.   Ms. Gruenhagen questions were answered to her satisfaction today. Our contact information was provided should additional questions or concerns arise. Thank you for the referral and allowing [place] to share in the care of your patient.   Jazmyn Offner M. Joette Catching, [place], [facility] Genetic Counselor [email or web address] (P) [phone]  The patient was seen for a total of 30 minutes in face-to-face genetic counseling.  The patient was seen alone.  Drs. Magrinat, Lindi Adie and/or Burr Medico were available to discuss this case as needed.    _______________________________________________________________________ For Office Staff:  Number of people involved in session: 1 Was an Intern/ student involved with case: no

## 2021-01-18 NOTE — Patient Instructions (Signed)

## 2021-01-19 ENCOUNTER — Encounter: Payer: Self-pay | Admitting: Hematology and Oncology

## 2021-01-20 ENCOUNTER — Encounter: Payer: Self-pay | Admitting: General Practice

## 2021-01-20 ENCOUNTER — Telehealth: Payer: Self-pay | Admitting: Behavioral Health

## 2021-01-20 ENCOUNTER — Ambulatory Visit: Payer: Medicaid Other | Admitting: Behavioral Health

## 2021-01-20 NOTE — Telephone Encounter (Signed)
Tried contacting Pt for today's telehealth session. Lft msg to r/s appt w/BH @ Pt's convenience.  Dr. Theodis Shove

## 2021-01-20 NOTE — Progress Notes (Signed)
[facility] CSW [facility] Notes  Request from [employer] [facility] for patient's information - she has applied for financial assistance on her own.  Called patient, she would like this information sent to agency.  Edwyna Shell, LCSW Clinical Social Worker Phone:  [phone]

## 2021-01-21 ENCOUNTER — Other Ambulatory Visit: Payer: Self-pay | Admitting: Hematology and Oncology

## 2021-01-21 ENCOUNTER — Telehealth: Payer: Self-pay

## 2021-01-21 MED ORDER — OXYCODONE-ACETAMINOPHEN 5-325 MG PO TABS
1.0000 | ORAL_TABLET | Freq: Three times a day (TID) | ORAL | 0 refills | Status: DC | PRN
Start: 2021-01-21 — End: 2021-02-01

## 2021-01-21 NOTE — Telephone Encounter (Signed)
[facility] called and states she has diarrhea (3 loose stools today) and is bleeding, but the bleeding may be coming from an outbreak d/t genital herpes. Pt is currently taking valcyclovir 1 g 1 tab PO BID. Pt also states she is having pains/cramps that "shoot up my spine" then goes away. Denies any lesions on spine. States tylenol/advil is not working and asks for pain medication. Informed pt I would let MD know of request.

## 2021-01-21 NOTE — Telephone Encounter (Signed)
Notified Patient of Prior Authorization approval for Oxycodone 5/325 mg tablets. Medication is approved for 20 tablets per 7 days and may fill up to a 34 days supply

## 2021-01-21 NOTE — Telephone Encounter (Signed)
Pt called and states phx will not fill RX. Phx states it is d/t pt having Oxycodone 15 mg filled 9/13. Pt should have enough through 9/27. Pt understands and states she will go to ED if needed.

## 2021-01-21 NOTE — Progress Notes (Signed)
Severe pain related to genital herpes: I sent a prescription for Percocet.

## 2021-01-24 ENCOUNTER — Other Ambulatory Visit: Payer: Self-pay

## 2021-01-24 ENCOUNTER — Encounter (HOSPITAL_BASED_OUTPATIENT_CLINIC_OR_DEPARTMENT_OTHER): Payer: Self-pay | Admitting: General Surgery

## 2021-01-24 ENCOUNTER — Encounter: Payer: Self-pay | Admitting: Hematology and Oncology

## 2021-01-25 ENCOUNTER — Other Ambulatory Visit: Payer: Self-pay

## 2021-01-25 ENCOUNTER — Encounter (HOSPITAL_COMMUNITY): Payer: Self-pay | Admitting: Emergency Medicine

## 2021-01-25 ENCOUNTER — Emergency Department (HOSPITAL_COMMUNITY)
Admission: EM | Admit: 2021-01-25 | Discharge: 2021-01-25 | Disposition: A | Discharge status: Home / Self Care | Payer: Medicaid Other | Attending: Physician Assistant | Admitting: Physician Assistant

## 2021-01-25 DIAGNOSIS — Z5321 Procedure and treatment not carried out due to patient leaving prior to being seen by health care provider: Secondary | ICD-10-CM | POA: Insufficient documentation

## 2021-01-25 DIAGNOSIS — K0889 Other specified disorders of teeth and supporting structures: Secondary | ICD-10-CM | POA: Insufficient documentation

## 2021-01-25 DIAGNOSIS — R300 Dysuria: Secondary | ICD-10-CM | POA: Diagnosis not present

## 2021-01-25 DIAGNOSIS — L299 Pruritus, unspecified: Secondary | ICD-10-CM | POA: Diagnosis not present

## 2021-01-25 DIAGNOSIS — N926 Irregular menstruation, unspecified: Secondary | ICD-10-CM | POA: Diagnosis not present

## 2021-01-25 DIAGNOSIS — L292 Pruritus vulvae: Secondary | ICD-10-CM | POA: Insufficient documentation

## 2021-01-25 LAB — URINALYSIS, ROUTINE W REFLEX MICROSCOPIC
Bilirubin Urine: NEGATIVE
Glucose, UA: NEGATIVE mg/dL
Hgb urine dipstick: NEGATIVE
Ketones, ur: NEGATIVE mg/dL
Nitrite: NEGATIVE
Protein, ur: NEGATIVE mg/dL
Specific Gravity, Urine: 1.025 (ref 1.005–1.030)
pH: 6 (ref 5.0–8.0)

## 2021-01-25 LAB — BASIC METABOLIC PANEL
Anion gap: 7 (ref 5–15)
BUN: 19 mg/dL (ref 6–20)
CO2: 28 mmol/L (ref 22–32)
Calcium: 9.8 mg/dL (ref 8.9–10.3)
Chloride: 104 mmol/L (ref 98–111)
Creatinine, Ser: 1.22 mg/dL — ABNORMAL HIGH (ref 0.44–1.00)
GFR, Estimated: 60 mL/min — ABNORMAL LOW (ref >60)
Glucose, Bld: 90 mg/dL (ref 70–99)
Potassium: 3.6 mmol/L (ref 3.5–5.1)
Sodium: 139 mmol/L (ref 135–145)

## 2021-01-25 LAB — CBC WITH DIFFERENTIAL/PLATELET
Abs Immature Granulocytes: 0.1 10*3/uL — ABNORMAL HIGH (ref 0.00–0.07)
Basophils Absolute: 0 10*3/uL (ref 0.0–0.1)
Basophils Relative: 0 %
Eosinophils Absolute: 0 10*3/uL (ref 0.0–0.5)
Eosinophils Relative: 0 %
HCT: 38 % (ref 36.0–46.0)
Hemoglobin: 12 g/dL (ref 12.0–15.0)
Immature Granulocytes: 1 %
Lymphocytes Relative: 29 %
Lymphs Abs: 2.7 10*3/uL (ref 0.7–4.0)
MCH: 27.6 pg (ref 26.0–34.0)
MCHC: 31.6 g/dL (ref 30.0–36.0)
MCV: 87.4 fL (ref 80.0–100.0)
Monocytes Absolute: 0.3 10*3/uL (ref 0.1–1.0)
Monocytes Relative: 4 %
Neutro Abs: 6.2 10*3/uL (ref 1.7–7.7)
Neutrophils Relative %: 66 %
Platelets: 151 10*3/uL (ref 150–400)
RBC: 4.35 MIL/uL (ref 3.87–5.11)
RDW: 13.6 % (ref 11.5–15.5)
WBC: 9.4 10*3/uL (ref 4.0–10.5)
nRBC: 0 % (ref 0.0–0.2)

## 2021-01-25 LAB — PREGNANCY, URINE: Preg Test, Ur: NEGATIVE

## 2021-01-25 NOTE — ED Notes (Signed)
Called for vital signs no answer

## 2021-01-25 NOTE — ED Notes (Signed)
Called to update vital signs no response

## 2021-01-25 NOTE — ED Triage Notes (Signed)
Pt reports dental pain, abnormal period, vaginal itching, and burning with urination that began after receiving a chemo treatment on 9/13. Sx have progressively worsened which prompted pt to want to be seen today.

## 2021-01-25 NOTE — ED Notes (Signed)
Called for VS - no answer 

## 2021-01-25 NOTE — ED Provider Notes (Signed)
Emergency Medicine Provider Triage Evaluation Note  Robin Horn , a 35 y.o. female  was evaluated in triage.  Pt complains of mult complaints. C/o left lower dental pain, vaginal itching, dysuria.  Review of Systems  Positive: Dental pain, vaginal itching, dysuria Negative: Chest pain  Physical Exam  BP (!) 161/109 (BP Location: Left Arm)   Pulse (!) 106   Temp 98 F (36.7 C) (Oral)   Resp 16   Ht 5\' 2"  (1.575 m)   Wt 70.8 kg   LMP 01/24/2021 (Exact Date)   SpO2 100%   BMI 28.53 kg/m  Gen:   Awake, no distress   Resp:  Normal effort  MSK:   Moves extremities without difficulty  Medical Decision Making  Medically screening exam initiated at 4:18 PM.  Appropriate orders placed.  Robin Horn was informed that the remainder of the evaluation will be completed by another provider, this initial triage assessment does not replace that evaluation, and the importance of remaining in the ED until their evaluation is complete.     Bishop Dublin 01/25/21 1619    Teressa Lower, MD 01/25/21 1757

## 2021-01-26 LAB — RPR: RPR Ser Ql: NONREACTIVE

## 2021-01-26 LAB — HIV ANTIBODY (ROUTINE TESTING W REFLEX): HIV Screen 4th Generation wRfx: NONREACTIVE

## 2021-01-27 ENCOUNTER — Encounter (HOSPITAL_BASED_OUTPATIENT_CLINIC_OR_DEPARTMENT_OTHER)
Admission: RE | Admit: 2021-01-27 | Discharge: 2021-01-27 | Disposition: A | Discharge status: Home / Self Care | Payer: Medicaid Other | Source: Ambulatory Visit | Attending: General Surgery | Admitting: General Surgery

## 2021-01-27 DIAGNOSIS — Z01812 Encounter for preprocedural laboratory examination: Secondary | ICD-10-CM | POA: Diagnosis not present

## 2021-01-27 NOTE — Progress Notes (Signed)

## 2021-01-29 DIAGNOSIS — Z419 Encounter for procedure for purposes other than remedying health state, unspecified: Secondary | ICD-10-CM | POA: Diagnosis not present

## 2021-01-31 ENCOUNTER — Ambulatory Visit (HOSPITAL_BASED_OUTPATIENT_CLINIC_OR_DEPARTMENT_OTHER): Payer: Medicaid Other | Admitting: Certified Registered"

## 2021-01-31 ENCOUNTER — Ambulatory Visit (HOSPITAL_BASED_OUTPATIENT_CLINIC_OR_DEPARTMENT_OTHER)
Admission: RE | Admit: 2021-01-31 | Discharge: 2021-01-31 | Disposition: A | Discharge status: Home / Self Care | Payer: Medicaid Other | Attending: General Surgery | Admitting: General Surgery

## 2021-01-31 ENCOUNTER — Other Ambulatory Visit: Payer: Self-pay

## 2021-01-31 ENCOUNTER — Encounter (HOSPITAL_BASED_OUTPATIENT_CLINIC_OR_DEPARTMENT_OTHER)
Admission: RE | Disposition: A | Discharge status: Home / Self Care | Payer: Self-pay | Source: Home / Self Care | Attending: General Surgery

## 2021-01-31 ENCOUNTER — Encounter (HOSPITAL_BASED_OUTPATIENT_CLINIC_OR_DEPARTMENT_OTHER): Payer: Self-pay | Admitting: General Surgery

## 2021-01-31 ENCOUNTER — Ambulatory Visit (HOSPITAL_COMMUNITY): Payer: Medicaid Other

## 2021-01-31 DIAGNOSIS — F17210 Nicotine dependence, cigarettes, uncomplicated: Secondary | ICD-10-CM | POA: Insufficient documentation

## 2021-01-31 DIAGNOSIS — C50412 Malignant neoplasm of upper-outer quadrant of left female breast: Secondary | ICD-10-CM | POA: Diagnosis not present

## 2021-01-31 DIAGNOSIS — Y838 Other surgical procedures as the cause of abnormal reaction of the patient, or of later complication, without mention of misadventure at the time of the procedure: Secondary | ICD-10-CM | POA: Insufficient documentation

## 2021-01-31 DIAGNOSIS — T82594A Other mechanical complication of infusion catheter, initial encounter: Secondary | ICD-10-CM | POA: Diagnosis not present

## 2021-01-31 DIAGNOSIS — Z419 Encounter for procedure for purposes other than remedying health state, unspecified: Secondary | ICD-10-CM

## 2021-01-31 DIAGNOSIS — F418 Other specified anxiety disorders: Secondary | ICD-10-CM | POA: Diagnosis not present

## 2021-01-31 DIAGNOSIS — Z170 Estrogen receptor positive status [ER+]: Secondary | ICD-10-CM | POA: Diagnosis not present

## 2021-01-31 DIAGNOSIS — I10 Essential (primary) hypertension: Secondary | ICD-10-CM | POA: Diagnosis not present

## 2021-01-31 DIAGNOSIS — Z803 Family history of malignant neoplasm of breast: Secondary | ICD-10-CM | POA: Insufficient documentation

## 2021-01-31 DIAGNOSIS — Z79899 Other long term (current) drug therapy: Secondary | ICD-10-CM | POA: Diagnosis not present

## 2021-01-31 DIAGNOSIS — Z452 Encounter for adjustment and management of vascular access device: Secondary | ICD-10-CM | POA: Diagnosis not present

## 2021-01-31 HISTORY — PX: PORT A CATH REVISION: SHX6033

## 2021-01-31 LAB — POCT PREGNANCY, URINE: Preg Test, Ur: NEGATIVE

## 2021-01-31 SURGERY — PORT A CATH REVISION
Anesthesia: Monitor Anesthesia Care | Site: Chest | Laterality: Right

## 2021-01-31 MED ORDER — ONDANSETRON HCL 4 MG/2ML IJ SOLN
INTRAMUSCULAR | Status: DC | PRN
  Administered 2021-01-31: 4 mg via INTRAVENOUS

## 2021-01-31 MED ORDER — PROMETHAZINE HCL 25 MG/ML IJ SOLN: 6.2500 mg | INTRAMUSCULAR | Status: DC | PRN

## 2021-01-31 MED ORDER — FENTANYL CITRATE (PF) 100 MCG/2ML IJ SOLN
INTRAMUSCULAR | Status: AC
  Filled 2021-01-31: qty 4

## 2021-01-31 MED ORDER — LIDOCAINE HCL (CARDIAC) PF 100 MG/5ML IV SOSY
PREFILLED_SYRINGE | INTRAVENOUS | Status: DC | PRN
  Administered 2021-01-31: 60 mg via INTRAVENOUS

## 2021-01-31 MED ORDER — LACTATED RINGERS IV SOLN: INTRAVENOUS | Status: DC

## 2021-01-31 MED ORDER — AMISULPRIDE (ANTIEMETIC) 5 MG/2ML IV SOLN
INTRAVENOUS | Status: AC
  Filled 2021-01-31: qty 4

## 2021-01-31 MED ORDER — HYDROMORPHONE HCL 1 MG/ML IJ SOLN
INTRAMUSCULAR | Status: AC
  Filled 2021-01-31: qty 0.5

## 2021-01-31 MED ORDER — FENTANYL CITRATE (PF) 100 MCG/2ML IJ SOLN
INTRAMUSCULAR | Status: DC | PRN
  Administered 2021-01-31 (×2): 25 ug via INTRAVENOUS
  Administered 2021-01-31: 50 ug via INTRAVENOUS

## 2021-01-31 MED ORDER — CHLORHEXIDINE GLUCONATE CLOTH 2 % EX PADS: 6.0000 | MEDICATED_PAD | Freq: Once | CUTANEOUS | Status: DC

## 2021-01-31 MED ORDER — PROMETHAZINE HCL 25 MG/ML IJ SOLN
INTRAMUSCULAR | Status: AC
  Filled 2021-01-31: qty 1

## 2021-01-31 MED ORDER — PROPOFOL 10 MG/ML IV BOLUS
INTRAVENOUS | Status: DC | PRN
  Administered 2021-01-31: 200 mg via INTRAVENOUS

## 2021-01-31 MED ORDER — OXYCODONE HCL 5 MG PO TABS
5.0000 mg | ORAL_TABLET | Freq: Once | ORAL | Status: AC | PRN
  Administered 2021-01-31: 5 mg via ORAL

## 2021-01-31 MED ORDER — PROPOFOL 500 MG/50ML IV EMUL
INTRAVENOUS | Status: DC | PRN
  Administered 2021-01-31: 25 ug/kg/min via INTRAVENOUS

## 2021-01-31 MED ORDER — BUPIVACAINE HCL (PF) 0.25 % IJ SOLN
INTRAMUSCULAR | Status: DC | PRN
  Administered 2021-01-31: 2 mL

## 2021-01-31 MED ORDER — OXYCODONE HCL 5 MG PO TABS
ORAL_TABLET | ORAL | Status: AC
  Filled 2021-01-31: qty 1

## 2021-01-31 MED ORDER — HEPARIN (PORCINE) IN NACL 2-0.9 UNITS/ML
INTRAMUSCULAR | Status: AC | PRN
  Administered 2021-01-31: 1

## 2021-01-31 MED ORDER — AMISULPRIDE (ANTIEMETIC) 5 MG/2ML IV SOLN
10.0000 mg | Freq: Once | INTRAVENOUS | Status: AC
  Administered 2021-01-31: 10 mg via INTRAVENOUS

## 2021-01-31 MED ORDER — CEFAZOLIN SODIUM-DEXTROSE 2-4 GM/100ML-% IV SOLN
2.0000 g | INTRAVENOUS | Status: AC
  Administered 2021-01-31: 2 g via INTRAVENOUS

## 2021-01-31 MED ORDER — HEPARIN SOD (PORK) LOCK FLUSH 100 UNIT/ML IV SOLN
INTRAVENOUS | Status: DC | PRN
  Administered 2021-01-31: 400 [IU]

## 2021-01-31 MED ORDER — MIDAZOLAM HCL 5 MG/5ML IJ SOLN
INTRAMUSCULAR | Status: DC | PRN
  Administered 2021-01-31: 2 mg via INTRAVENOUS

## 2021-01-31 MED ORDER — MIDAZOLAM HCL 2 MG/2ML IJ SOLN
INTRAMUSCULAR | Status: AC
  Filled 2021-01-31: qty 2

## 2021-01-31 MED ORDER — HYDROMORPHONE HCL 1 MG/ML IJ SOLN
0.2500 mg | INTRAMUSCULAR | Status: DC | PRN
  Administered 2021-01-31 (×3): 0.5 mg via INTRAVENOUS

## 2021-01-31 MED ORDER — DEXAMETHASONE SODIUM PHOSPHATE 4 MG/ML IJ SOLN
INTRAMUSCULAR | Status: DC | PRN
Start: 2021-01-31 — End: 2021-01-31
  Administered 2021-01-31: 4 mg via INTRAVENOUS

## 2021-01-31 MED ORDER — ACETAMINOPHEN 500 MG PO TABS: 1000.0000 mg | ORAL_TABLET | ORAL | Status: DC

## 2021-01-31 MED ORDER — OXYCODONE HCL 5 MG/5ML PO SOLN: 5.0000 mg | Freq: Once | ORAL | Status: AC | PRN

## 2021-01-31 MED FILL — Dexamethasone Sodium Phosphate Inj 100 MG/10ML: INTRAMUSCULAR | Qty: 1 | Status: AC

## 2021-01-31 MED FILL — Fosaprepitant Dimeglumine For IV Infusion 150 MG (Base Eq): INTRAVENOUS | Qty: 5 | Status: AC

## 2021-01-31 SURGICAL SUPPLY — 38 items
ADH SKN CLS APL DERMABOND .7 (GAUZE/BANDAGES/DRESSINGS) ×1
APL PRP STRL LF DISP 70% ISPRP (MISCELLANEOUS) ×1
BAG DECANTER FOR FLEXI CONT (MISCELLANEOUS) ×2 IMPLANT
BLADE SURG 11 STRL SS (BLADE) ×2 IMPLANT
BLADE SURG 15 STRL LF DISP TIS (BLADE) ×1 IMPLANT
BLADE SURG 15 STRL SS (BLADE) ×2
CHLORAPREP W/TINT 26 (MISCELLANEOUS) ×2 IMPLANT
COVER BACK TABLE 60X90IN (DRAPES) ×2 IMPLANT
COVER MAYO STAND STRL (DRAPES) ×2 IMPLANT
DERMABOND ADVANCED (GAUZE/BANDAGES/DRESSINGS) ×1
DERMABOND ADVANCED .7 DNX12 (GAUZE/BANDAGES/DRESSINGS) ×1 IMPLANT
DRAPE C-ARM 42X72 X-RAY (DRAPES) ×2 IMPLANT
DRAPE LAPAROSCOPIC ABDOMINAL (DRAPES) ×2 IMPLANT
DRSG TEGADERM 4X4.75 (GAUZE/BANDAGES/DRESSINGS) ×2 IMPLANT
ELECT COATED BLADE 2.86 ST (ELECTRODE) ×2 IMPLANT
ELECT REM PT RETURN 9FT ADLT (ELECTROSURGICAL) ×2
ELECTRODE REM PT RTRN 9FT ADLT (ELECTROSURGICAL) ×1 IMPLANT
GAUZE SPONGE 4X4 12PLY STRL LF (GAUZE/BANDAGES/DRESSINGS) ×2 IMPLANT
GLOVE SURG ENC MOIS LTX SZ7 (GLOVE) ×2 IMPLANT
GLOVE SURG POLYISO LF SZ6.5 (GLOVE) ×1 IMPLANT
GLOVE SURG UNDER POLY LF SZ6.5 (GLOVE) ×2 IMPLANT
GLOVE SURG UNDER POLY LF SZ7.5 (GLOVE) ×2 IMPLANT
GOWN STRL REUS W/ TWL LRG LVL3 (GOWN DISPOSABLE) ×4 IMPLANT
GOWN STRL REUS W/TWL LRG LVL3 (GOWN DISPOSABLE) ×6
IV HEPARIN 1000UNITS/500ML (IV SOLUTION) ×2 IMPLANT
KIT PORT POWER 8FR ISP CVUE (Port) ×1 IMPLANT
NDL HYPO 25X1 1.5 SAFETY (NEEDLE) ×1 IMPLANT
NEEDLE HYPO 25X1 1.5 SAFETY (NEEDLE) ×2 IMPLANT
PACK BASIN DAY SURGERY FS (CUSTOM PROCEDURE TRAY) ×2 IMPLANT
PENCIL SMOKE EVACUATOR (MISCELLANEOUS) ×2 IMPLANT
SLEEVE SCD COMPRESS KNEE MED (STOCKING) ×2 IMPLANT
SUT MON AB 4-0 PC3 18 (SUTURE) ×2 IMPLANT
SUT PROLENE 2 0 SH DA (SUTURE) ×2 IMPLANT
SUT VIC AB 3-0 SH 27 (SUTURE) ×2
SUT VIC AB 3-0 SH 27X BRD (SUTURE) ×1 IMPLANT
SYR 5ML LUER SLIP (SYRINGE) ×2 IMPLANT
SYR CONTROL 10ML LL (SYRINGE) ×2 IMPLANT
TOWEL GREEN STERILE FF (TOWEL DISPOSABLE) ×2 IMPLANT

## 2021-01-31 NOTE — Discharge Instructions (Addendum)
PORT-A-CATH: POST OP INSTRUCTIONS  Always review your discharge instruction sheet given to you by the facility where your surgery was performed.   A prescription for pain medication may be given to you upon discharge. Take your pain medication as prescribed, if needed. If narcotic pain medicine is not needed, then you make take acetaminophen (Tylenol) or ibuprofen (Advil) as needed.  Take your usually prescribed medications unless otherwise directed. If you need a refill on your pain medication, please contact our office. All narcotic pain medicine now requires a paper prescription.  Phoned in and fax refills are no longer allowed by law.  Prescriptions will not be filled after 5 pm or on weekends.  You should follow a light diet for the remainder of the day after your procedure. Most patients will experience some mild swelling and/or bruising in the area of the incision. It may take several days to resolve. It is common to experience some constipation if taking pain medication after surgery. Increasing fluid intake and taking a stool softener (such as Colace) will usually help or prevent this problem from occurring. A mild laxative (Milk of Magnesia or Miralax) should be taken according to package directions if there are no bowel movements after 48 hours.  Unless discharge instructions indicate otherwise, you may remove your bandages 48 hours after surgery, and you may shower at that time. You may have steri-strips (small white skin tapes) in place directly over the incision.  These strips should be left on the skin for 7-10 days.  If your surgeon used Dermabond (skin glue) on the incision, you may shower in 24 hours.  The glue will flake off over the next 2-3 weeks.  If your port is left accessed at the end of surgery (needle left in port), the dressing cannot get wet and should only by changed by a healthcare professional. When the port is no longer accessed (when the needle has been removed),  follow step 7.   ACTIVITIES:  Limit activity involving your arms for the next 72 hours. Do no strenuous exercise or activity for 1 week. You may drive when you are no longer taking prescription pain medication, you can comfortably wear a seatbelt, and you can maneuver your car. 10.You may need to see your doctor in the office for a follow-up appointment.  Please       check with your doctor.  11.When you receive a new Port-a-Cath, you will get a product guide and        ID card.  Please keep them in case you need them.  WHEN TO CALL YOUR DOCTOR [phone]): Fever over 101.0 Chills Continued bleeding from incision Increased redness and tenderness at the site Shortness of breath, difficulty breathing   The clinic staff is available to answer your questions during regular business hours. Please don't hesitate to call and ask to speak to one of the nurses or medical assistants for clinical concerns. If you have a medical emergency, go to the nearest emergency room or call 911.  A surgeon from [facility] is always on call at the hospital.     For further information, please visit [email or web address]     Post Anesthesia Home Care Instructions  Activity: Get plenty of rest for the remainder of the day. A responsible individual must stay with you for 24 hours following the procedure.  For the next 24 hours, DO NOT: -Drive a car -Paediatric nurse -Drink alcoholic beverages -Take any medication unless instructed by your  physician -Make any legal decisions or sign important papers.  Meals: Start with liquid foods such as gelatin or soup. Progress to regular foods as tolerated. Avoid greasy, spicy, heavy foods. If nausea and/or vomiting occur, drink only clear liquids until the nausea and/or vomiting subsides. Call your physician if vomiting continues.  Special Instructions/Symptoms: Your throat may feel dry or sore from the anesthesia or the breathing tube  placed in your throat during surgery. If this causes discomfort, gargle with warm salt water. The discomfort should disappear within 24 hours.  If you had a scopolamine patch placed behind your ear for the management of post- operative nausea and/or vomiting:  1. The medication in the patch is effective for 72 hours, after which it should be removed.  Wrap patch in a tissue and discard in the trash. Wash hands thoroughly with soap and water. 2. You may remove the patch earlier than 72 hours if you experience unpleasant side effects which may include dry mouth, dizziness or visual disturbances. 3. Avoid touching the patch. Wash your hands with soap and water after contact with the patch.  *May take Tylenol at 4pm today 01/31/21    You had 5 mg of Oxycodone at 2:22 PM.

## 2021-01-31 NOTE — Anesthesia Procedure Notes (Signed)
Procedure Name: LMA Insertion Date/Time: 01/31/2021 12:30 PM Performed by: Signe Colt, CRNA Pre-anesthesia Checklist: Patient identified, Emergency Drugs available, Suction available and Patient being monitored Patient Re-evaluated:Patient Re-evaluated prior to induction Oxygen Delivery Method: Circle System Utilized Preoxygenation: Pre-oxygenation with 100% oxygen Induction Type: IV induction Ventilation: Mask ventilation without difficulty LMA: LMA inserted LMA Size: 4.0 Number of attempts: 1 Airway Equipment and Method: bite block Placement Confirmation: positive ETCO2 Tube secured with: Tape Dental Injury: Teeth and Oropharynx as per pre-operative assessment

## 2021-01-31 NOTE — Anesthesia Postprocedure Evaluation (Signed)
Anesthesia Post Note  Patient: Robin Horn  Procedure(s) Performed: PORT A CATH REVISION (Right: Chest)     Patient location during evaluation: PACU Anesthesia Type: MAC Level of consciousness: awake and alert Pain management: pain level controlled Vital Signs Assessment: post-procedure vital signs reviewed and stable Respiratory status: spontaneous breathing, nonlabored ventilation and respiratory function stable Cardiovascular status: blood pressure returned to baseline and stable Postop Assessment: no apparent nausea or vomiting Anesthetic complications: no   No notable events documented.  Last Vitals:  Vitals:   01/31/21 1345 01/31/21 1400  BP: (!) 138/103 (!) 142/99  Pulse: 88 83  Resp: 14 16  Temp:    SpO2: 100% 100%    Last Pain:  Vitals:   01/31/21 1415  TempSrc:   PainSc: [place]

## 2021-01-31 NOTE — Op Note (Signed)
Preoperative diagnosis: Left breast cancer, kinked tubing from port Postoperative diagnosis: Same as above Procedure:  Right IJ  port revision Surgeon: Dr. Serita Grammes Anesthesia: General  Estimated blood loss: minimal Specimens:none Sponge and needle count was correct at completion Drains: None Disposition recovery stable condition   Indications:   62 yof  with right IJ port that line wouldn't flush or aspirate and appears kinked. Discussed revision.      Procedure: After informed consent was obtained she was taken to the OR. She was given antibiotics.  SCDs were placed.  She was placed under general anesthesia without complication.  She was prepped and draped in the standard sterile surgical fashion.  A surgical timeout was then performed.    I reopened the incision on her right chest as well as the neck stab incision.  I then remove the port.  Disconnected the line.  I pulled the line up through the neck.  I then placed a wire under fluoroscopy.  I remove the old line as it appeared that there was an actual kink in it.  I then placed over the wire under fluoroscopy.I tunneled the line between the 2 sites.  I pulled the line back to be in the superior vena cava. The tip of the line is in the superior vena cava near the cavoatrial junction. I then attached the port.  I sutured this into place with 2-0 Prolene. I then closed this with 3-0 Vicryl and 4-0 Monocryl.  Glue was placed.  Final fluoroscopic image showed the port to be in good position.  I then accessed the port and was able to aspirate blood and packed this with heparin.  She tolerated well, was transferred to recovery stable.

## 2021-01-31 NOTE — Transfer of Care (Signed)
Immediate Anesthesia Transfer of Care Note  Patient: Robin Horn  Procedure(s) Performed: PORT A CATH REVISION (Right: Chest)  Patient Location: PACU  Anesthesia Type:General  Level of Consciousness: drowsy and patient cooperative  Airway & Oxygen Therapy: Patient Spontanous Breathing and Patient connected to face mask oxygen  Post-op Assessment: Report given to RN and Post -op Vital signs reviewed and stable  Post vital signs: Reviewed and stable  Last Vitals:  Vitals Value Taken Time  BP 119/90 01/31/21 1317  Temp    Pulse 84 01/31/21 1319  Resp 13 01/31/21 1319  SpO2 100 % 01/31/21 1319  Vitals shown include unvalidated device data.  Last Pain:  Vitals:   01/31/21 1210  TempSrc: Oral  PainSc: 0-No pain      Patients Stated Pain Goal: 3 (20/80/22 3361)  Complications: No notable events documented.

## 2021-01-31 NOTE — Interval H&P Note (Signed)
History and Physical Interval Note:  01/31/2021 12:09 PM Port was functional at surgery last time but xray shows possible kink and unable to flush/aspirate at cancer center. Discussed revision Robin Horn  has presented today for surgery, with the diagnosis of BREAST CANCER.  The various methods of treatment have been discussed with the patient and family. After consideration of risks, benefits and other options for treatment, the patient has consented to  Procedure(s): PORT A CATH REVISION (N/A) as a surgical intervention.  The patient's history has been reviewed, patient examined, no change in status, stable for surgery.  I have reviewed the patient's chart and labs.  Questions were answered to the patient's satisfaction.     Rolm Bookbinder

## 2021-01-31 NOTE — Anesthesia Preprocedure Evaluation (Addendum)
Anesthesia Evaluation  Patient identified by MRN, date of birth, ID band Patient awake    Reviewed: Allergy & Precautions, NPO status , Patient's Chart, lab work & pertinent test results  Airway Mallampati: II  TM Distance: >3 FB Neck ROM: Full    Dental  (+) Poor Dentition, Dental Advisory Given   Pulmonary neg pulmonary ROS, Current Smoker and Patient abstained from smoking.,    breath sounds clear to auscultation       Cardiovascular hypertension, Pt. on medications Normal cardiovascular exam Rhythm:Regular Rate:Normal     Neuro/Psych  Headaches, PSYCHIATRIC DISORDERS Anxiety Depression Bipolar Disorder    GI/Hepatic Neg liver ROS, GERD  ,  Endo/Other  negative endocrine ROSdiabetes  Renal/GU Renal InsufficiencyRenal disease  negative genitourinary   Musculoskeletal  (+) Arthritis ,   Abdominal   Peds  Hematology negative hematology ROS (+)   Anesthesia Other Findings Left breast CA  Reproductive/Obstetrics                             Anesthesia Physical  Anesthesia Plan  ASA: 3  Anesthesia Plan: General   Post-op Pain Management:    Induction: Intravenous  PONV Risk Score and Plan: 2 and Ondansetron, Treatment may vary due to age or medical condition and Midazolam  Airway Management Planned: LMA  Additional Equipment:   Intra-op Plan:   Post-operative Plan: Extubation in OR  Informed Consent: I have reviewed the patients History and Physical, chart, labs and discussed the procedure including the risks, benefits and alternatives for the proposed anesthesia with the patient or authorized representative who has indicated his/her understanding and acceptance.     Dental advisory given  Plan Discussed with: CRNA  Anesthesia Plan Comments:        Anesthesia Quick Evaluation

## 2021-01-31 NOTE — Progress Notes (Signed)
Patient Care Team: [name], DO as PCP - General (Internal Medicine) Claudia Pollock, RN as Registered Nurse Dayna Barker, MD as Consulting Physician (General Surgery) Mauro Kaufmann, RN as Oncology Nurse Navigator Rockwell Germany, RN as Oncology Nurse Navigator  DIAGNOSIS:    ICD-10-CM   1. Malignant neoplasm of upper-outer quadrant of left breast in female, estrogen receptor positive ([place])  C50.412    Z17.0       SUMMARY OF ONCOLOGIC HISTORY: Oncology History  Left breast lump (Resolved)  Malignant neoplasm of upper-outer quadrant of left breast in female, estrogen receptor positive ([place])  12/17/2020 Initial Diagnosis   Palpable lump in the upper outer left breast. 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, Her2+ (3+)/ER+ (80%)/PR- (<1%) in the left breast, and invasive ductal carcinoma, Her2+ (3+)/ER+ (80%)/PR+ (3%) in the left axillary lymph node   12/23/2020 Cancer Staging   Staging form: Breast, AJCC 8th Edition - Clinical stage from 12/23/2020: Stage IB (cT2, cN1, cM0, G3, ER+, PR+, HER2+) - Signed by [name], MD on 12/23/2020 Stage prefix: Initial diagnosis Histologic grading system: 3 grade system   01/11/2021 -  Chemotherapy    Patient is on Treatment Plan: BREAST  DOCETAXEL + CARBOPLATIN + TRASTUZUMAB + PERTUZUMAB  (TCHP) Q21D          CHIEF COMPLIANT: Cycle 2 TCH Perjeta   INTERVAL HISTORY: Robin Horn is a 35 y.o. with above-mentioned history of left breast cancer, currently on chemotherapy with [place]. She presents to the clinic today for treatment.  Since the last couple of weeks her side effects from first cycle of chemotherapy have significantly improved.  She had lost significant amount of weight her appetite was extremely poor and  there is some smell of meat puts her out off.  She is starting to eat better.  Her bowels have normalized.  She is drinking plenty of water. Patient has chronic back pain that radiates from the lower back up to the neck area.  She has an appointment with a new pain physician in the next couple of weeks and she is requesting [place] to refill her pain medication today.  ALLERGIES:  is allergic to tramadol, hydrocodone-acetaminophen, and lactose intolerance (gi).  MEDICATIONS:  Current Outpatient Medications  Medication Sig Dispense Refill   acetaminophen (TYLENOL) 325 MG tablet Take 500 mg by mouth every 6 (six) hours as needed.     BELBUCA 750 MCG FILM Take 1 strip by mouth 2 (two) times daily.     butalbital-acetaminophen-caffeine (FIORICET) 50-325-40 MG tablet Take 1 tablet by mouth 2 (two) times daily as needed.     dexamethasone (DECADRON) 4 MG tablet Take 1 tablet (4 mg total) by mouth daily. Take 1 tablet day before chemo and 1 tablet day after chemo with food 12 tablet 0   lidocaine-prilocaine (EMLA) cream Apply to affected area once 30 g 3   lisinopril (ZESTRIL) 10 MG tablet Take 10 mg by mouth daily.     magic mouthwash w/lidocaine SOLN Take 5 mLs by mouth 3 (three) times daily as needed for mouth pain. 200 mL 0   ondansetron (ZOFRAN) 8 MG tablet Take 1 tablet (8 mg total) by mouth 2 (two) times daily as needed (Nausea or vomiting). Start on the third day after chemotherapy. 30 tablet 1   oxyCODONE (  ROXICODONE) 15 MG immediate release tablet Take 1 tablet (15 mg total) by mouth every 8 (eight) hours as needed for pain. 60 tablet 0   oxyCODONE-acetaminophen (PERCOCET/ROXICET) 5-325 MG tablet Take 1 tablet by mouth every 8 (eight) hours as needed for severe pain. 20 tablet 0   prochlorperazine (COMPAZINE) 10 MG tablet Take 1 tablet (10 mg total) by mouth every 6 (six) hours as needed (Nausea or vomiting). 30 tablet 1   QUEtiapine (SEROQUEL) 100 MG tablet Take 1 tablet (100 mg total) by mouth at  bedtime. 30 tablet 3   zolpidem (AMBIEN) 5 MG tablet Take 1 tablet (5 mg total) by mouth at bedtime as needed. 30 tablet 3   No current facility-administered medications for this visit.    PHYSICAL EXAMINATION: ECOG PERFORMANCE STATUS: 1 - Symptomatic but completely ambulatory  There were no vitals filed for this visit. There were no vitals filed for this visit.  LABORATORY DATA:  I have reviewed the data as listed CMP Latest Ref Rng & Units 01/25/2021 01/18/2021 01/11/2021  Glucose 70 - 99 mg/dL 90 98 98  BUN 6 - 20 mg/dL _0 Creatinine 0.44 - 1.00 mg/dL 1.22(H) 1.35(H) 1.09(H)  Sodium 135 - 145 mmol/L 139 138 141  Potassium 3.5 - 5.1 mmol/L 3.6 4.2 4.3  Chloride 98 - 111 mmol/L 104 101 109  CO2 22 - 32 mmol/L _1 Calcium 8.9 - 10.3 mg/dL 9.8 10.1 9.0  Total Protein 6.5 - 8.1 g/dL - 8.0 6.7  Total Bilirubin 0.3 - 1.2 mg/dL - <0.2(L) <0.2(L)  Alkaline Phos 38 - 126 U/L - 98 60  AST 15 - 41 U/L - 16 16  ALT 0 - 44 U/L - 20 28    Lab Results  Component Value Date   WBC 9.4 01/25/2021   HGB 12.0 01/25/2021   HCT 38.0 01/25/2021   MCV 87.4 01/25/2021   PLT 151 01/25/2021   NEUTROABS 6.2 01/25/2021    ASSESSMENT & PLAN:  Malignant neoplasm of upper-outer quadrant of left breast in female, estrogen receptor positive ([place]) 12/17/2020: Palpable lump in the upper outer left breast. 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 Neoadjuvant chemotherapy with TCH Perjeta 6 cycles followed by Herceptin Perjeta maintenance versus Kadcyla maintenance (based on response to neoadjuvant chemo) for 1  year 2. Followed by breast conserving surgery versus mastectomy if possible with targeted node dissection 3. Followed by adjuvant radiation therapy  4.  Followed by adjuvant antiestrogen therapy -------------------------------------------------------------------------------------------------------------------------------------------------- Current treatment: Cycle 2 [place] Perjeta Echocardiogram 01/10/2021: EF 55 to 60   Chemo toxicities: Severe sores in the mouth: I resolved. Severe fatigue Poor appetite and dehydration: We had to give her 1 L of normal saline. Reduced the dosage of cycle 2 chemotherapy.    Chronic pain: I renewed her prescription for oxycodone 37m.  She has an appointment with a pain physician and will subsequently receive her pain regimen through them.  Return to clinic in 3 weeks for cycle 3      No orders of the defined types were placed in this encounter.  The patient has a good understanding of the overall plan.  she agrees with it. she will call with any problems that may develop before the next visit here.  Total time spent: 30 mins including face to face time and time spent for planning, charting and coordination of care  Rulon Eisenmenger, MD, MPH 02/01/2021  I, Thana Ates, am acting as scribe for Dr. [name].  I have reviewed the above documentation for accuracy and completeness, and I agree with the above.

## 2021-02-01 ENCOUNTER — Other Ambulatory Visit: Payer: Self-pay | Admitting: Hematology and Oncology

## 2021-02-01 ENCOUNTER — Encounter (HOSPITAL_BASED_OUTPATIENT_CLINIC_OR_DEPARTMENT_OTHER): Payer: Self-pay | Admitting: General Surgery

## 2021-02-01 ENCOUNTER — Inpatient Hospital Stay (HOSPITAL_BASED_OUTPATIENT_CLINIC_OR_DEPARTMENT_OTHER): Payer: Medicaid Other | Admitting: Hematology and Oncology

## 2021-02-01 ENCOUNTER — Inpatient Hospital Stay: Payer: Medicaid Other | Attending: Hematology and Oncology

## 2021-02-01 ENCOUNTER — Inpatient Hospital Stay: Payer: Medicaid Other

## 2021-02-01 ENCOUNTER — Encounter: Payer: Self-pay | Admitting: Hematology and Oncology

## 2021-02-01 VITALS — HR 106

## 2021-02-01 DIAGNOSIS — Z5112 Encounter for antineoplastic immunotherapy: Secondary | ICD-10-CM | POA: Insufficient documentation

## 2021-02-01 DIAGNOSIS — Z170 Estrogen receptor positive status [ER+]: Secondary | ICD-10-CM

## 2021-02-01 DIAGNOSIS — Z79899 Other long term (current) drug therapy: Secondary | ICD-10-CM | POA: Insufficient documentation

## 2021-02-01 DIAGNOSIS — Z5111 Encounter for antineoplastic chemotherapy: Secondary | ICD-10-CM | POA: Insufficient documentation

## 2021-02-01 DIAGNOSIS — C50412 Malignant neoplasm of upper-outer quadrant of left female breast: Secondary | ICD-10-CM

## 2021-02-01 DIAGNOSIS — Z23 Encounter for immunization: Secondary | ICD-10-CM | POA: Diagnosis not present

## 2021-02-01 DIAGNOSIS — Z5189 Encounter for other specified aftercare: Secondary | ICD-10-CM | POA: Insufficient documentation

## 2021-02-01 DIAGNOSIS — Z95828 Presence of other vascular implants and grafts: Secondary | ICD-10-CM

## 2021-02-01 LAB — CBC WITH DIFFERENTIAL (CANCER CENTER ONLY)
Abs Immature Granulocytes: 0.04 10*3/uL (ref 0.00–0.07)
Basophils Absolute: 0 10*3/uL (ref 0.0–0.1)
Basophils Relative: 0 %
Eosinophils Absolute: 0 10*3/uL (ref 0.0–0.5)
Eosinophils Relative: 0 %
HCT: 29.8 % — ABNORMAL LOW (ref 36.0–46.0)
Hemoglobin: 9.9 g/dL — ABNORMAL LOW (ref 12.0–15.0)
Immature Granulocytes: 0 %
Lymphocytes Relative: 9 %
Lymphs Abs: 1 10*3/uL (ref 0.7–4.0)
MCH: 28.4 pg (ref 26.0–34.0)
MCHC: 33.2 g/dL (ref 30.0–36.0)
MCV: 85.6 fL (ref 80.0–100.0)
Monocytes Absolute: 0.4 10*3/uL (ref 0.1–1.0)
Monocytes Relative: 4 %
Neutro Abs: 9.4 10*3/uL — ABNORMAL HIGH (ref 1.7–7.7)
Neutrophils Relative %: 87 %
Platelet Count: 257 10*3/uL (ref 150–400)
RBC: 3.48 MIL/uL — ABNORMAL LOW (ref 3.87–5.11)
RDW: 14 % (ref 11.5–15.5)
WBC Count: 10.8 10*3/uL — ABNORMAL HIGH (ref 4.0–10.5)
nRBC: 0 % (ref 0.0–0.2)

## 2021-02-01 LAB — CMP (CANCER CENTER ONLY)
ALT: 14 U/L (ref 0–44)
AST: 14 U/L — ABNORMAL LOW (ref 15–41)
Albumin: 3.8 g/dL (ref 3.5–5.0)
Alkaline Phosphatase: 66 U/L (ref 38–126)
Anion gap: 10 (ref 5–15)
BUN: 26 mg/dL — ABNORMAL HIGH (ref 6–20)
CO2: 22 mmol/L (ref 22–32)
Calcium: 9.2 mg/dL (ref 8.9–10.3)
Chloride: 108 mmol/L (ref 98–111)
Creatinine: 1.44 mg/dL — ABNORMAL HIGH (ref 0.44–1.00)
GFR, Estimated: 49 mL/min — ABNORMAL LOW (ref >60)
Glucose, Bld: 171 mg/dL — ABNORMAL HIGH (ref 70–99)
Potassium: 4.1 mmol/L (ref 3.5–5.1)
Sodium: 140 mmol/L (ref 135–145)
Total Bilirubin: <0.2 mg/dL — ABNORMAL LOW (ref 0.3–1.2)
Total Protein: 6.8 g/dL (ref 6.5–8.1)

## 2021-02-01 MED ORDER — SODIUM CHLORIDE 0.9 % IV SOLN: Freq: Once | INTRAVENOUS | Status: DC

## 2021-02-01 MED ORDER — SODIUM CHLORIDE 0.9 % IV SOLN
420.0000 mg | Freq: Once | INTRAVENOUS | Status: AC
  Administered 2021-02-01: 420 mg via INTRAVENOUS
  Filled 2021-02-01: qty 14

## 2021-02-01 MED ORDER — HEPARIN SOD (PORK) LOCK FLUSH 100 UNIT/ML IV SOLN
500.0000 [IU] | Freq: Once | INTRAVENOUS | Status: AC | PRN
  Administered 2021-02-01: 500 [IU]

## 2021-02-01 MED ORDER — SODIUM CHLORIDE 0.9 % IV SOLN
440.5000 mg | Freq: Once | INTRAVENOUS | Status: AC
  Administered 2021-02-01: 440 mg via INTRAVENOUS
  Filled 2021-02-01: qty 44

## 2021-02-01 MED ORDER — DEXAMETHASONE 4 MG PO TABS: 4.0000 mg | ORAL_TABLET | Freq: Every day | ORAL | 0 refills | Status: DC

## 2021-02-01 MED ORDER — SODIUM CHLORIDE 0.9% FLUSH
10.0000 mL | Freq: Once | INTRAVENOUS | Status: AC
  Administered 2021-02-01: 10 mL

## 2021-02-01 MED ORDER — SODIUM CHLORIDE 0.9 % IV SOLN
10.0000 mg | Freq: Once | INTRAVENOUS | Status: AC
  Administered 2021-02-01: 10 mg via INTRAVENOUS
  Filled 2021-02-01: qty 10

## 2021-02-01 MED ORDER — SODIUM CHLORIDE 0.9 % IV SOLN
65.0000 mg/m2 | Freq: Once | INTRAVENOUS | Status: AC
  Administered 2021-02-01: 110 mg via INTRAVENOUS
  Filled 2021-02-01: qty 11

## 2021-02-01 MED ORDER — PALONOSETRON HCL INJECTION 0.25 MG/5ML
0.2500 mg | Freq: Once | INTRAVENOUS | Status: AC
  Administered 2021-02-01: 0.25 mg via INTRAVENOUS
  Filled 2021-02-01: qty 5

## 2021-02-01 MED ORDER — SODIUM CHLORIDE 0.9 % IV SOLN
150.0000 mg | Freq: Once | INTRAVENOUS | Status: AC
  Administered 2021-02-01: 150 mg via INTRAVENOUS
  Filled 2021-02-01: qty 150

## 2021-02-01 MED ORDER — SODIUM CHLORIDE 0.9% FLUSH
10.0000 mL | INTRAVENOUS | Status: DC | PRN
  Administered 2021-02-01: 10 mL

## 2021-02-01 MED ORDER — TRASTUZUMAB-DKST CHEMO 150 MG IV SOLR
450.0000 mg | Freq: Once | INTRAVENOUS | Status: AC
  Administered 2021-02-01: 450 mg via INTRAVENOUS
  Filled 2021-02-01: qty 21.43

## 2021-02-01 MED ORDER — DIPHENHYDRAMINE HCL 25 MG PO CAPS
25.0000 mg | ORAL_CAPSULE | Freq: Once | ORAL | Status: AC
  Administered 2021-02-01: 25 mg via ORAL
  Filled 2021-02-01: qty 1

## 2021-02-01 MED ORDER — ACETAMINOPHEN 325 MG PO TABS
650.0000 mg | ORAL_TABLET | Freq: Once | ORAL | Status: AC
  Administered 2021-02-01: 650 mg via ORAL
  Filled 2021-02-01: qty 2

## 2021-02-01 MED ORDER — OXYCODONE HCL 15 MG PO TABS: 15.0000 mg | ORAL_TABLET | ORAL | 0 refills | Status: DC | PRN

## 2021-02-01 NOTE — Assessment & Plan Note (Signed)
12/17/2020:Palpable lump in the upper outer left breast.Diagnostic mammogram and [place] on08/17/22showed multiple suspicious masses, 2abnormal lymph nodes with increased cortical thickness,1 lymph node with borderline abnormal cortical thickness,Biopsy on08/19/22showed invasive ductal carcinoma and DCIS, Her2+ (3+)/ER+ (80%)/PR- (<1%) in the left breast, and invasive ductal carcinoma, Her2+ (3+)/ER+ (80%)/PR+ (3%) in the left axillary lymph node T1CN1A stage Ib  Treatment plan: 1. Neoadjuvant chemotherapy with TCH Perjeta 6 cycles followed by Herceptin Perjeta maintenance versus Kadcyla maintenance (based on response to neoadjuvant chemo) for 1 year 2. Followed by breast conserving surgeryversus mastectomyif possible with targeted node dissection 3. Followed by adjuvant radiation therapy 4.Followed by adjuvant antiestrogen therapy -------------------------------------------------------------------------------------------------------------------------------------------------- Current treatment: Cycle 2 TCH Perjeta Echocardiogram 01/10/2021: EF 55 to 60  Chemo toxicities: 1. Severe sores in the mouth: I will send a prescription for Magic mouthwash. 2. Severe fatigue 3. Poor appetite and dehydration: We will try to give her 1 L of normal saline. 4. We will reduce the dosage of cycle 2 of her chemotherapy because of the profound fatigue that she has been experiencing and also profound loss of appetite Return to clinic in 3 weeks for cycle 3

## 2021-02-01 NOTE — Patient Instructions (Signed)
[facility] MEDICAL ONCOLOGY  Discharge Instructions: Thank you for choosing [facility] to provide your oncology and hematology care.   If you have a lab appointment with the Cancer Center, please go directly to the Cancer Center and check in at the registration area.   Wear comfortable clothing and clothing appropriate for easy access to any Portacath or PICC line.   We strive to give you quality time with your provider. You may need to reschedule your appointment if you arrive late (15 or more minutes).  Arriving late affects you and other patients whose appointments are after yours.  Also, if you miss three or more appointments without notifying the office, you may be dismissed from the clinic at the provider's discretion.      For prescription refill requests, have your pharmacy contact our office and allow 72 hours for refills to be completed.    Today you received the following chemotherapy and/or immunotherapy agents Trastuzumab, pertuzumab, docetaxel, carboplatin   To help prevent nausea and vomiting after your treatment, we encourage you to take your nausea medication as directed.  BELOW ARE SYMPTOMS THAT SHOULD BE REPORTED IMMEDIATELY: *FEVER GREATER THAN 100.4 F (38 C) OR HIGHER *CHILLS OR SWEATING *NAUSEA AND VOMITING THAT IS NOT CONTROLLED WITH YOUR NAUSEA MEDICATION *UNUSUAL SHORTNESS OF BREATH *UNUSUAL BRUISING OR BLEEDING *URINARY PROBLEMS (pain or burning when urinating, or frequent urination) *BOWEL PROBLEMS (unusual diarrhea, constipation, pain near the anus) TENDERNESS IN MOUTH AND THROAT WITH OR WITHOUT PRESENCE OF ULCERS (sore throat, sores in mouth, or a toothache) UNUSUAL RASH, SWELLING OR PAIN  UNUSUAL VAGINAL DISCHARGE OR ITCHING   Items with * indicate a potential emergency and should be followed up as soon as possible or go to the Emergency Department if any problems should occur.  Please show the CHEMOTHERAPY ALERT CARD or  IMMUNOTHERAPY ALERT CARD at check-in to the Emergency Department and triage nurse.  Should you have questions after your visit or need to cancel or reschedule your appointment, please contact [facility]  Dept: [phone]  and follow the prompts.  Office hours are 8:00 a.m. to 4:30 p.m. Monday - Friday. Please note that voicemails left after 4:00 p.m. may not be returned until the following business day.  We are closed weekends and major holidays. You have access to a nurse at all times for urgent questions. Please call the main number to the clinic Dept: [phone] and follow the prompts.   For any non-urgent questions, you may also contact your provider using MyChart. We now offer e-Visits for anyone 18 and older to request care online for non-urgent symptoms. For details visit [email or web address].   Also download the MyChart app! Go to the app store, search "MyChart", open the app, select , and log in with your MyChart username and password.  Due to Covid, a mask is required upon entering the hospital/clinic. If you do not have a mask, one will be given to you upon arrival. For doctor visits, patients may have 1 support person aged 18 or older with them. For treatment visits, patients cannot have anyone with them due to current Covid guidelines and our immunocompromised population.  

## 2021-02-01 NOTE — Progress Notes (Signed)
Patient was approved for one-time $1000 [employer] grant on 01/13/21 to assist with personal expenses while going through treatment.  She has a copy of the approval letter and expense sheet along with the Outpatient pharmacy information. She has been utilizing the grant.  She has my card for any additional financial questions or concerns.

## 2021-02-01 NOTE — Progress Notes (Signed)
Okay to treat with elevated pulse per Dr. Lindi Adie

## 2021-02-03 ENCOUNTER — Encounter: Payer: Self-pay | Admitting: Genetic Counselor

## 2021-02-03 ENCOUNTER — Ambulatory Visit: Payer: Self-pay | Admitting: Genetic Counselor

## 2021-02-03 ENCOUNTER — Telehealth: Payer: Self-pay | Admitting: Genetic Counselor

## 2021-02-03 ENCOUNTER — Other Ambulatory Visit: Payer: Self-pay

## 2021-02-03 ENCOUNTER — Inpatient Hospital Stay: Payer: Medicaid Other

## 2021-02-03 VITALS — BP 136/97 | HR 89 | Temp 99.0°F | Resp 16

## 2021-02-03 DIAGNOSIS — Z5189 Encounter for other specified aftercare: Secondary | ICD-10-CM | POA: Diagnosis not present

## 2021-02-03 DIAGNOSIS — Z170 Estrogen receptor positive status [ER+]: Secondary | ICD-10-CM

## 2021-02-03 DIAGNOSIS — Z79899 Other long term (current) drug therapy: Secondary | ICD-10-CM | POA: Diagnosis not present

## 2021-02-03 DIAGNOSIS — Z23 Encounter for immunization: Secondary | ICD-10-CM | POA: Diagnosis not present

## 2021-02-03 DIAGNOSIS — C50412 Malignant neoplasm of upper-outer quadrant of left female breast: Secondary | ICD-10-CM

## 2021-02-03 DIAGNOSIS — Z1379 Encounter for other screening for genetic and chromosomal anomalies: Secondary | ICD-10-CM | POA: Insufficient documentation

## 2021-02-03 DIAGNOSIS — Z803 Family history of malignant neoplasm of breast: Secondary | ICD-10-CM

## 2021-02-03 DIAGNOSIS — Z5111 Encounter for antineoplastic chemotherapy: Secondary | ICD-10-CM | POA: Diagnosis not present

## 2021-02-03 DIAGNOSIS — Z5112 Encounter for antineoplastic immunotherapy: Secondary | ICD-10-CM | POA: Diagnosis not present

## 2021-02-03 MED ORDER — PEGFILGRASTIM-CBQV 6 MG/0.6ML ~~LOC~~ SOSY
6.0000 mg | PREFILLED_SYRINGE | Freq: Once | SUBCUTANEOUS | Status: AC
  Administered 2021-02-03: 6 mg via SUBCUTANEOUS
  Filled 2021-02-03: qty 0.6

## 2021-02-03 NOTE — Telephone Encounter (Addendum)
Revealed negative genetic testing.  Discussed that we do not know why she has breast cancer or why there is cancer in the family. It could be familial, due to a different gene that we are not testing, or maybe our current technology may not be able to pick something up.  It will be important for her to keep in contact with genetics to keep up with whether additional testing may be needed.  Recommended genetic testing for maternal aunt diagnosed with breast cancer in 76s.

## 2021-02-03 NOTE — Progress Notes (Signed)
HPI:  Robin Horn was previously seen in the [place] clinic due to a personal and family history of cancer and concerns regarding a hereditary predisposition to cancer. Please refer to our prior cancer genetics clinic note for more information regarding our discussion, assessment and recommendations, at the time. Robin Horn recent genetic test results were disclosed to her, as were recommendations warranted by these results. These results and recommendations are discussed in more detail below.  CANCER HISTORY:  Oncology History  Left breast lump (Resolved)  Malignant neoplasm of upper-outer quadrant of left breast in female, estrogen receptor positive ([place])  12/17/2020 Initial Diagnosis   Palpable lump in the upper outer left breast. 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, Her2+ (3+)/ER+ (80%)/PR- (<1%) in the left breast, and invasive ductal carcinoma, Her2+ (3+)/ER+ (80%)/PR+ (3%) in the left axillary lymph node   12/23/2020 Cancer Staging   Staging form: Breast, AJCC 8th Edition - Clinical stage from 12/23/2020: Stage IB (cT2, cN1, cM0, G3, ER+, PR+, HER2+) - Signed by [name], MD on 12/23/2020 Stage prefix: Initial diagnosis Histologic grading system: 3 grade system   01/11/2021 -  Chemotherapy   Patient is on Treatment Plan : BREAST  Docetaxel + Carboplatin + Trastuzumab + Pertuzumab  (TCHP) q21d      01/31/2021 Genetic Testing   Negative hereditary cancer genetic testing: no pathogenic variants detected in [facility] Common Hereditary Cancers +RNA Panel.  The report date is January 31, 2021.    The Common Hereditary Cancers + RNA Panel offered by [facility] includes sequencing, deletion/duplication, and RNA testing of the following 47 genes: APC, ATM, AXIN2, BARD1,  BMPR1A, BRCA1, BRCA2, BRIP1, CDH1, CDK4*, CDKN2A (p14ARF)*, CDKN2A (p16INK4a)*, CHEK2, CTNNA1, DICER1, EPCAM (Deletion/duplication testing only), GREM1 (promoter region deletion/duplication testing only), KIT, MEN1, MLH1, MSH2, MSH3, MSH6, MUTYH, NBN, NF1, NHTL1, PALB2, PDGFRA*, PMS2, POLD1, POLE, PTEN, RAD50, RAD51C, RAD51D, SDHB, SDHC, SDHD, SMAD4, SMARCA4. STK11, TP53, TSC1, TSC2, and VHL.  The following genes were evaluated for sequence changes only: SDHA and HOXB13 c.251G>A variant only.  RNA analysis is not performed for the * genes.       FAMILY HISTORY:  We obtained a detailed, 4-generation family history.  Significant diagnoses are listed below: Family History  Problem Relation Age of Onset   Breast cancer Maternal Aunt        dx 45s   Breast cancer Other        MGM's sister and MGM's niece; dx after 73      Robin Horn is unaware of previous family history of genetic testing for hereditary cancer risks. There is no reported Ashkenazi Jewish ancestry. There is no known consanguinity.  GENETIC TEST RESULTS: Genetic testing reported out on January 31, 2021. The Common Hereditary Cancers +RNA Panel found no pathogenic mutations.  The Common Hereditary Cancers + RNA Panel offered by [facility] includes sequencing, deletion/duplication, and RNA testing of the following 47 genes: APC, ATM, AXIN2, BARD1, BMPR1A, BRCA1, BRCA2, BRIP1, CDH1, CDK4*, CDKN2A (p14ARF)*, CDKN2A (p16INK4a)*, CHEK2, CTNNA1, DICER1, EPCAM (Deletion/duplication testing only), GREM1 (promoter region deletion/duplication testing only), KIT, MEN1, MLH1, MSH2, MSH3, MSH6, MUTYH, NBN, NF1, NHTL1, PALB2, PDGFRA*, PMS2, POLD1, POLE, PTEN, RAD50, RAD51C, RAD51D, SDHB, SDHC, SDHD, SMAD4, SMARCA4. STK11, TP53, TSC1, TSC2, and VHL.  The following genes were evaluated for sequence changes only: SDHA and HOXB13  c.251G>A variant only.  RNA analysis is not performed for the * genes.    The test report has been scanned into EPIC and is located  under the Molecular Pathology section of the Results Review tab.  A portion of the result report is included below for reference.     We discussed with Robin Horn that because current genetic testing is not perfect, it is possible there may be a gene mutation in one of these genes that current testing cannot detect, but that chance is small.  We also discussed, that there could be another gene that has not yet been discovered, or that we have not yet tested, that is responsible for the cancer diagnoses in the family. It is also possible there is a hereditary cause for the cancer in the family that Robin Horn did not inherit and therefore was not identified in her testing.  Therefore, it is important to remain in touch with cancer genetics in the future so that we can continue to offer Robin Horn the most up to date genetic testing.   ADDITIONAL GENETIC TESTING: There are other genes that are associated with increased cancer risk that can be analyzed. Should Robin Horn wish to pursue additional genetic testing, we are happy to discuss and coordinate this testing, at any time.     CANCER SCREENING RECOMMENDATIONS: Robin Horn test result is considered negative (normal).  This means that we have not identified a hereditary cause for her personal history of cancer at this time. Most cancers are sporadic/familial, chance and this negative test suggests that her cancer may fall into this category.    This does not definitively rule out a hereditary predisposition to cancer. It is still possible that there could be genetic mutations that are undetectable by current technology. There could be genetic mutations in genes that have not been tested or identified to increase cancer risk.  Therefore, it is recommended she continue to follow the cancer management and screening guidelines provided by her oncology and primary healthcare provider.   An individual's cancer risk and medical management are not determined  by genetic test results alone. Overall cancer risk assessment incorporates additional factors, including personal medical history, family history, and any available genetic information that may result in a personalized plan for cancer prevention and surveillance   RECOMMENDATIONS FOR FAMILY MEMBERS:  Individuals in this family might be at some increased risk of developing cancer, over the general population risk, simply due to the family history of cancer.  We recommended women in this family have a yearly mammogram beginning at age 37, or 16 years younger than the earliest onset of cancer, an annual clinical breast exam, and perform monthly breast self-exams. Women in this family should also have a gynecological exam as recommended by their primary provider. Family members should be referred for colonoscopy starting at age 27, or earlier, as recommended by their providers.   It is also possible there is a hereditary cause for the cancer in Robin Horn's family that she did not inherit and therefore was not identified in her.  Based on Robin Horn's family history, we recommended her maternal aunt, who was diagnosed with breast cancer in her 101s, have genetic counseling and testing. Robin Horn will let us know if we can be of any assistance in coordinating genetic counseling and/or testing for this family member.   FOLLOW-UP: Lastly, we discussed with Robin Horn that cancer genetics is a rapidly advancing field and it is possible that  new genetic tests will be appropriate for her and/or her family members in the future. We encouraged her to remain in contact with cancer genetics on an annual basis so we can update her personal and family histories and let her know of advances in cancer genetics that may benefit this family.   Our contact number was provided. Robin Horn questions were answered to her satisfaction, and she knows she is welcome to call us at anytime with additional questions or concerns.      Bern Fare M. Joette Catching, [place], [facility] Genetic Counselor [email or web address] (P) [phone]

## 2021-02-03 NOTE — Patient Instructions (Signed)

## 2021-02-04 ENCOUNTER — Encounter: Payer: Self-pay | Admitting: *Deleted

## 2021-02-04 NOTE — Progress Notes (Signed)
[place] Clinical Social Work  Clinical Social Work received phone call requesting assistance with completing provider portion of [employer]. CSW completed required letter and sent via secure email on patient's behalf.    Gwinda Maine, LCSW  Clinical Social Worker [facility]

## 2021-02-09 ENCOUNTER — Other Ambulatory Visit: Payer: Self-pay | Admitting: Hematology and Oncology

## 2021-02-10 ENCOUNTER — Encounter: Payer: Self-pay | Admitting: Hematology and Oncology

## 2021-02-10 ENCOUNTER — Ambulatory Visit: Payer: Medicaid Other | Admitting: Behavioral Health

## 2021-02-10 DIAGNOSIS — C50412 Malignant neoplasm of upper-outer quadrant of left female breast: Secondary | ICD-10-CM

## 2021-02-10 DIAGNOSIS — Z170 Estrogen receptor positive status [ER+]: Secondary | ICD-10-CM

## 2021-02-10 DIAGNOSIS — F331 Major depressive disorder, recurrent, moderate: Secondary | ICD-10-CM

## 2021-02-10 DIAGNOSIS — F419 Anxiety disorder, unspecified: Secondary | ICD-10-CM
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
## 2021-02-11 ENCOUNTER — Other Ambulatory Visit: Payer: Self-pay | Admitting: Hematology and Oncology

## 2021-02-11 MED ORDER — OXYCODONE HCL 15 MG PO TABS: 15.0000 mg | ORAL_TABLET | Freq: Three times a day (TID) | ORAL | 0 refills | Status: DC | PRN

## 2021-02-15 ENCOUNTER — Encounter: Payer: Self-pay | Admitting: Physical Medicine and Rehabilitation

## 2021-02-15 ENCOUNTER — Other Ambulatory Visit: Payer: Self-pay

## 2021-02-15 ENCOUNTER — Encounter
Payer: Medicaid Other | Attending: Physical Medicine and Rehabilitation | Admitting: Physical Medicine and Rehabilitation

## 2021-02-15 VITALS — BP 130/84 | HR 94 | Temp 98.8°F | Ht 62.0 in | Wt 167.0 lb

## 2021-02-15 DIAGNOSIS — Z5181 Encounter for therapeutic drug level monitoring: Secondary | ICD-10-CM | POA: Diagnosis not present

## 2021-02-15 DIAGNOSIS — M48062 Spinal stenosis, lumbar region with neurogenic claudication: Secondary | ICD-10-CM | POA: Diagnosis not present

## 2021-02-15 DIAGNOSIS — Z79899 Other long term (current) drug therapy: Secondary | ICD-10-CM | POA: Insufficient documentation

## 2021-02-15 DIAGNOSIS — G894 Chronic pain syndrome: Secondary | ICD-10-CM | POA: Insufficient documentation
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
## 2021-02-15 NOTE — Patient Instructions (Signed)
-  Discussed following foods that may reduce pain: 1) Ginger (especially studied for arthritis)- reduce leukotriene production to decrease inflammation 2) Blueberries- high in phytonutrients that decrease inflammation 3) Salmon- marine omega-3s reduce joint swelling and pain 4) Pumpkin seeds- reduce inflammation 5) dark chocolate- reduces inflammation 6) turmeric- reduces inflammation 7) tart cherries - reduce pain and stiffness 8) extra virgin olive oil - its compound olecanthal helps to block prostaglandins  9) chili peppers- can be eaten or applied topically via capsaicin 10) mint- helpful for headache, muscle aches, joint pain, and itching 11) garlic- reduces inflammation  Link to further information on diet for chronic pain: [email or web address]

## 2021-02-21 LAB — TOXASSURE SELECT,+ANTIDEPR,UR

## 2021-02-21 MED FILL — Dexamethasone Sodium Phosphate Inj 100 MG/10ML: INTRAMUSCULAR | Qty: 1 | Status: AC

## 2021-02-21 MED FILL — Fosaprepitant Dimeglumine For IV Infusion 150 MG (Base Eq): INTRAVENOUS | Qty: 5 | Status: AC

## 2021-02-21 NOTE — Progress Notes (Signed)
Patient Care Team: [name], DO as PCP - General (Internal Medicine) Claudia Pollock, RN as Registered Nurse Dayna Barker, MD as Consulting Physician (General Surgery) Mauro Kaufmann, RN as Oncology Nurse Navigator Rockwell Germany, RN as Oncology Nurse Navigator  DIAGNOSIS:    ICD-10-CM   1. Malignant neoplasm of upper-outer quadrant of left breast in female, estrogen receptor positive ([place])  C50.412    Z17.0       SUMMARY OF ONCOLOGIC HISTORY: Oncology History  Left breast lump (Resolved)  Malignant neoplasm of upper-outer quadrant of left breast in female, estrogen receptor positive ([place])  12/17/2020 Initial Diagnosis   Palpable lump in the upper outer left breast. 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, Her2+ (3+)/ER+ (80%)/PR- (<1%) in the left breast, and invasive ductal carcinoma, Her2+ (3+)/ER+ (80%)/PR+ (3%) in the left axillary lymph node   12/23/2020 Horn Staging   Staging form: Breast, AJCC 8th Edition - Clinical stage from 12/23/2020: Stage IB (cT2, cN1, cM0, G3, ER+, PR+, HER2+) - Signed by [name], MD on 12/23/2020 Stage prefix: Initial diagnosis Histologic grading system: 3 grade system    01/11/2021 -  Chemotherapy   Patient is on Treatment Plan : BREAST  Docetaxel + Carboplatin + Trastuzumab + Pertuzumab  (TCHP) q21d      01/31/2021 Genetic Testing   Negative hereditary Horn genetic testing: no pathogenic variants detected in [facility] Common Hereditary Cancers +RNA Panel.  The report date is January 31, 2021.    The Common Hereditary Cancers + RNA Panel offered by [facility] includes sequencing, deletion/duplication, and RNA testing of the following 47 genes: APC, ATM, AXIN2, BARD1, BMPR1A, BRCA1, BRCA2, BRIP1, CDH1, CDK4*, CDKN2A (p14ARF)*,  CDKN2A (p16INK4a)*, CHEK2, CTNNA1, DICER1, EPCAM (Deletion/duplication testing only), GREM1 (promoter region deletion/duplication testing only), KIT, MEN1, MLH1, MSH2, MSH3, MSH6, MUTYH, NBN, NF1, NHTL1, PALB2, PDGFRA*, PMS2, POLD1, POLE, PTEN, RAD50, RAD51C, RAD51D, SDHB, SDHC, SDHD, SMAD4, SMARCA4. STK11, TP53, TSC1, TSC2, and VHL.  The following genes were evaluated for sequence changes only: SDHA and HOXB13 c.251G>A variant only.  RNA analysis is not performed for the * genes.       CHIEF COMPLIANT: Cycle 3 TCH Perjeta   INTERVAL HISTORY: Robin Horn is a 35 y.o. with above-mentioned history of left breast Horn, Robin on chemotherapy with [place]. She presents to the clinic today for treatment.   ALLERGIES:  is allergic to tramadol, hydrocodone-acetaminophen, and lactose intolerance (gi).  MEDICATIONS:  Current Outpatient Medications  Medication Sig Dispense Refill   acetaminophen (TYLENOL) 325 MG tablet Take 500 mg by mouth every 6 (six) hours as needed.     cetirizine (ZYRTEC) 10 MG tablet Take 10 mg by mouth daily.     lidocaine-prilocaine (EMLA) cream Apply to affected area once 30 g 3   lisinopril (ZESTRIL) 10 MG tablet Take 10 mg by mouth daily.     magic mouthwash w/lidocaine SOLN Take 5 mLs by mouth 3 (three) times daily as needed for mouth pain. 200 mL 0   medroxyPROGESTERone Acetate 150 MG/ML SUSY Inject into the muscle.     ondansetron (ZOFRAN) 8 MG tablet Take 1 tablet (8 mg total) by mouth 2 (two) times daily as needed (Nausea or vomiting). Start on the third day after chemotherapy. 30 tablet 1   oxyCODONE (ROXICODONE) 15 MG immediate release tablet  Take 1 tablet (15 mg total) by mouth every 8 (eight) hours as needed for pain. 90 tablet 0   prochlorperazine (COMPAZINE) 10 MG tablet Take 1 tablet (10 mg total) by mouth every 6 (six) hours as needed (Nausea or vomiting). 30 tablet 1   QUEtiapine (SEROQUEL) 100 MG tablet Take 1 tablet (100 mg total) by mouth at  bedtime. 30 tablet 3   tiZANidine (ZANAFLEX) 4 MG tablet Take 4 mg by mouth 3 (three) times daily.     valACYclovir (VALTREX) 1000 MG tablet Take 1,000 mg by mouth daily.     zolpidem (AMBIEN) 5 MG tablet Take 1 tablet (5 mg total) by mouth at bedtime as needed. 30 tablet 3   No current facility-administered medications for this visit.    PHYSICAL EXAMINATION: ECOG PERFORMANCE STATUS: 1 - Symptomatic but completely ambulatory  Vitals:   02/22/21 0925  BP: 122/80  Pulse: (!) 108  Resp: 18  Temp: 97.8 F (36.6 C)  SpO2: 100%   Filed Weights   02/22/21 0925  Weight: 159 lb (72.1 kg)     LABORATORY DATA:  I have reviewed the data as listed CMP Latest Ref Rng & Units 02/01/2021 01/25/2021 01/18/2021  Glucose 70 - 99 mg/dL 171(H) 90 98  BUN 6 - 20 mg/dL 26(H) 19 14  Creatinine 0.44 - 1.00 mg/dL 1.44(H) 1.22(H) 1.35(H)  Sodium 135 - 145 mmol/L 140 139 138  Potassium 3.5 - 5.1 mmol/L 4.1 3.6 4.2  Chloride 98 - 111 mmol/L 108 104 101  CO2 22 - 32 mmol/L _0 Calcium 8.9 - 10.3 mg/dL 9.2 9.8 10.1  Total Protein 6.5 - 8.1 g/dL 6.8 - 8.0  Total Bilirubin 0.3 - 1.2 mg/dL <0.2(L) - <0.2(L)  Alkaline Phos 38 - 126 U/L 66 - 98  AST 15 - 41 U/L 14(L) - 16  ALT 0 - 44 U/L 14 - 20    Lab Results  Component Value Date   WBC 8.0 02/22/2021   HGB 9.4 (L) 02/22/2021   HCT 28.7 (L) 02/22/2021   MCV 87.0 02/22/2021   PLT 213 02/22/2021   NEUTROABS 4.8 02/22/2021    ASSESSMENT & PLAN:  Malignant neoplasm of upper-outer quadrant of left breast in female, estrogen receptor positive ([place]) 12/17/2020: Palpable lump in the upper outer left breast. 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, Her2+ (3+)/ER+ (80%)/PR- (<1%) in the left breast, and invasive ductal carcinoma, Her2+ (3+)/ER+  (80%)/PR+ (3%) in the left axillary lymph node T1CN1A stage Ib   Treatment plan: 1. Neoadjuvant chemotherapy with TCH Perjeta 6 cycles followed by Herceptin Perjeta maintenance versus Kadcyla maintenance (based on response to neoadjuvant chemo) for 1 year 2. Followed by breast conserving surgery versus mastectomy if possible with targeted node dissection 3. Followed by adjuvant radiation therapy  4.  Followed by adjuvant antiestrogen therapy -------------------------------------------------------------------------------------------------------------------------------------------------- Current treatment: Cycle 3 TCH Perjeta Echocardiogram 01/10/2021: EF 55 to 60   Chemo toxicities: Severe sores in the mouth: I resolved. Severe fatigue Poor appetite and dehydration: Previously received IV fluids.  Encouraged to drink more water.  She is able to drink Gatorade and flavored drinks. Reduced the dosage of cycle 2 chemotherapy.   Chemo induced anemia: Hemoglobin is 9.4 monitoring closely.   Chronic pain: I renewed her prescription for oxycodone 40m.  She has an appointment with  a pain physician and will subsequently receive her pain regimen through them.   Return to clinic in 3 weeks for cycle 4      No orders of the defined types were placed in this encounter.  The patient has a good understanding of the overall plan. she agrees with it. she will call with any problems that may develop before the next visit here.  Total time spent: 30 mins including face to face time and time spent for planning, charting and coordination of care  Rulon Eisenmenger, MD, MPH 02/22/2021  I, Thana Ates, am acting as scribe for Dr. [name].  I have reviewed the above documentation for accuracy and completeness, and I agree with the above.

## 2021-02-22 ENCOUNTER — Other Ambulatory Visit: Payer: Self-pay

## 2021-02-22 ENCOUNTER — Inpatient Hospital Stay: Payer: Medicaid Other

## 2021-02-22 ENCOUNTER — Telehealth: Payer: Self-pay

## 2021-02-22 ENCOUNTER — Inpatient Hospital Stay (HOSPITAL_BASED_OUTPATIENT_CLINIC_OR_DEPARTMENT_OTHER): Payer: Medicaid Other | Admitting: Hematology and Oncology

## 2021-02-22 ENCOUNTER — Encounter: Payer: Self-pay | Admitting: *Deleted

## 2021-02-22 ENCOUNTER — Telehealth: Payer: Self-pay | Admitting: *Deleted

## 2021-02-22 VITALS — BP 111/72 | HR 88 | Temp 98.7°F | Resp 18

## 2021-02-22 DIAGNOSIS — Z170 Estrogen receptor positive status [ER+]: Secondary | ICD-10-CM

## 2021-02-22 DIAGNOSIS — C50412 Malignant neoplasm of upper-outer quadrant of left female breast: Secondary | ICD-10-CM

## 2021-02-22 DIAGNOSIS — Z95828 Presence of other vascular implants and grafts: Secondary | ICD-10-CM

## 2021-02-22 DIAGNOSIS — Z5112 Encounter for antineoplastic immunotherapy: Secondary | ICD-10-CM | POA: Diagnosis not present

## 2021-02-22 DIAGNOSIS — Z23 Encounter for immunization: Secondary | ICD-10-CM

## 2021-02-22 DIAGNOSIS — Z5189 Encounter for other specified aftercare: Secondary | ICD-10-CM | POA: Diagnosis not present

## 2021-02-22 DIAGNOSIS — Z5111 Encounter for antineoplastic chemotherapy: Secondary | ICD-10-CM | POA: Diagnosis not present

## 2021-02-22 DIAGNOSIS — Z79899 Other long term (current) drug therapy: Secondary | ICD-10-CM | POA: Diagnosis not present

## 2021-02-22 LAB — CMP (CANCER CENTER ONLY)
ALT: 12 U/L (ref 0–44)
AST: 11 U/L — ABNORMAL LOW (ref 15–41)
Albumin: 3.7 g/dL (ref 3.5–5.0)
Alkaline Phosphatase: 73 U/L (ref 38–126)
Anion gap: 12 (ref 5–15)
BUN: 14 mg/dL (ref 6–20)
CO2: 25 mmol/L (ref 22–32)
Calcium: 9.5 mg/dL (ref 8.9–10.3)
Chloride: 105 mmol/L (ref 98–111)
Creatinine: 1.23 mg/dL — ABNORMAL HIGH (ref 0.44–1.00)
GFR, Estimated: 59 mL/min — ABNORMAL LOW (ref >60)
Glucose, Bld: 103 mg/dL — ABNORMAL HIGH (ref 70–99)
Potassium: 3.6 mmol/L (ref 3.5–5.1)
Sodium: 142 mmol/L (ref 135–145)
Total Bilirubin: 0.3 mg/dL (ref 0.3–1.2)
Total Protein: 7.2 g/dL (ref 6.5–8.1)

## 2021-02-22 LAB — CBC WITH DIFFERENTIAL (CANCER CENTER ONLY)
Abs Immature Granulocytes: 0.05 10*3/uL (ref 0.00–0.07)
Basophils Absolute: 0 10*3/uL (ref 0.0–0.1)
Basophils Relative: 0 %
Eosinophils Absolute: 0 10*3/uL (ref 0.0–0.5)
Eosinophils Relative: 0 %
HCT: 28.7 % — ABNORMAL LOW (ref 36.0–46.0)
Hemoglobin: 9.4 g/dL — ABNORMAL LOW (ref 12.0–15.0)
Immature Granulocytes: 1 %
Lymphocytes Relative: 30 %
Lymphs Abs: 2.4 10*3/uL (ref 0.7–4.0)
MCH: 28.5 pg (ref 26.0–34.0)
MCHC: 32.8 g/dL (ref 30.0–36.0)
MCV: 87 fL (ref 80.0–100.0)
Monocytes Absolute: 0.8 10*3/uL (ref 0.1–1.0)
Monocytes Relative: 10 %
Neutro Abs: 4.8 10*3/uL (ref 1.7–7.7)
Neutrophils Relative %: 59 %
Platelet Count: 213 10*3/uL (ref 150–400)
RBC: 3.3 MIL/uL — ABNORMAL LOW (ref 3.87–5.11)
RDW: 16.4 % — ABNORMAL HIGH (ref 11.5–15.5)
WBC Count: 8 10*3/uL (ref 4.0–10.5)
nRBC: 0 % (ref 0.0–0.2)

## 2021-02-22 MED ORDER — SODIUM CHLORIDE 0.9% FLUSH
10.0000 mL | INTRAVENOUS | Status: DC | PRN
  Administered 2021-02-22: 10 mL

## 2021-02-22 MED ORDER — HEPARIN SOD (PORK) LOCK FLUSH 100 UNIT/ML IV SOLN
500.0000 [IU] | Freq: Once | INTRAVENOUS | Status: AC | PRN
  Administered 2021-02-22: 500 [IU]

## 2021-02-22 MED ORDER — TRASTUZUMAB-DKST CHEMO 150 MG IV SOLR
6.0000 mg/kg | Freq: Once | INTRAVENOUS | Status: AC
  Administered 2021-02-22: 441 mg via INTRAVENOUS
  Filled 2021-02-22: qty 21

## 2021-02-22 MED ORDER — INFLUENZA VAC SPLIT QUAD 0.5 ML IM SUSY
0.5000 mL | PREFILLED_SYRINGE | Freq: Once | INTRAMUSCULAR | Status: AC
  Administered 2021-02-22: 0.5 mL via INTRAMUSCULAR
  Filled 2021-02-22: qty 0.5

## 2021-02-22 MED ORDER — SODIUM CHLORIDE 0.9% FLUSH
10.0000 mL | Freq: Once | INTRAVENOUS | Status: AC
Start: 2021-02-22 — End: 2021-02-22
  Administered 2021-02-22: 10 mL

## 2021-02-22 MED ORDER — SODIUM CHLORIDE 0.9 % IV SOLN
65.0000 mg/m2 | Freq: Once | INTRAVENOUS | Status: AC
  Administered 2021-02-22: 110 mg via INTRAVENOUS
  Filled 2021-02-22: qty 11

## 2021-02-22 MED ORDER — ACETAMINOPHEN 325 MG PO TABS
650.0000 mg | ORAL_TABLET | Freq: Once | ORAL | Status: AC
  Administered 2021-02-22: 650 mg via ORAL
  Filled 2021-02-22: qty 2

## 2021-02-22 MED ORDER — SODIUM CHLORIDE 0.9 % IV SOLN
494.5000 mg | Freq: Once | INTRAVENOUS | Status: AC
  Administered 2021-02-22: 490 mg via INTRAVENOUS
  Filled 2021-02-22: qty 49

## 2021-02-22 MED ORDER — DIPHENHYDRAMINE HCL 25 MG PO CAPS
25.0000 mg | ORAL_CAPSULE | Freq: Once | ORAL | Status: AC
  Administered 2021-02-22: 25 mg via ORAL
  Filled 2021-02-22: qty 1

## 2021-02-22 MED ORDER — SODIUM CHLORIDE 0.9 % IV SOLN: Freq: Once | INTRAVENOUS | Status: AC

## 2021-02-22 MED ORDER — PALONOSETRON HCL INJECTION 0.25 MG/5ML
0.2500 mg | Freq: Once | INTRAVENOUS | Status: AC
  Administered 2021-02-22: 0.25 mg via INTRAVENOUS
  Filled 2021-02-22: qty 5

## 2021-02-22 MED ORDER — SODIUM CHLORIDE 0.9 % IV SOLN
10.0000 mg | Freq: Once | INTRAVENOUS | Status: AC
  Administered 2021-02-22: 10 mg via INTRAVENOUS
  Filled 2021-02-22: qty 10

## 2021-02-22 MED ORDER — SODIUM CHLORIDE 0.9 % IV SOLN
150.0000 mg | Freq: Once | INTRAVENOUS | Status: AC
  Administered 2021-02-22: 150 mg via INTRAVENOUS
  Filled 2021-02-22: qty 150

## 2021-02-22 MED ORDER — SODIUM CHLORIDE 0.9 % IV SOLN
420.0000 mg | Freq: Once | INTRAVENOUS | Status: AC
  Administered 2021-02-22: 420 mg via INTRAVENOUS
  Filled 2021-02-22: qty 14

## 2021-02-22 NOTE — Telephone Encounter (Signed)
Patient notified of completion of requested form for [place]. Fax Transmission Confirmation received and Copy placed for Pick-up at registration desk as requested

## 2021-02-22 NOTE — Telephone Encounter (Addendum)
Left VM to call our office. I also let her know to check her MyChart for a message from our office.  ----- Message from Izora Ribas, MD sent at 02/22/2021  9:07 AM EDT ----- Regarding: FW: Cocaine in urine sample- can you please advise her we can not prescribe opioids and discus wean with her? Can continue to follow with me for non-narcotic management if she would like

## 2021-02-22 NOTE — Assessment & Plan Note (Signed)
12/17/2020:Palpable lump in the upper outer left breast.Diagnostic mammogram and [place] on08/17/22showed multiple suspicious masses, 2abnormal lymph nodes with increased cortical thickness,1 lymph node with borderline abnormal cortical thickness,Biopsy on08/19/22showed invasive ductal carcinoma and DCIS, Her2+ (3+)/ER+ (80%)/PR- (<1%) in the left breast, and invasive ductal carcinoma, Her2+ (3+)/ER+ (80%)/PR+ (3%) in the left axillary lymph node T1CN1A stage Ib  Treatment plan: 1. Neoadjuvant chemotherapy with TCH Perjeta 6 cycles followed by Herceptin Perjeta maintenance versus Kadcyla maintenance (based on response to neoadjuvant chemo) for 1 year 2. Followed by breast conserving surgeryversus mastectomyif possible with targeted node dissection 3. Followed by adjuvant radiation therapy 4.Followed by adjuvant antiestrogen therapy -------------------------------------------------------------------------------------------------------------------------------------------------- Current treatment: Cycle 3TCH Perjeta Echocardiogram 01/10/2021: EF 55 to 60  Chemo toxicities: 1. Severe sores in the mouth: I resolved. 2. Severe fatigue 3. Poor appetite and dehydration: We had to give her 1 L of normal saline. 4. Reduced the dosage of cycle 2 chemotherapy.    Chronic pain: I renewed her prescription for oxycodone 9m.  She has an appointment with a pain physician and will subsequently receive her pain regimen through them.  Return to clinic in 3 weeks for cycle 4

## 2021-02-22 NOTE — Telephone Encounter (Signed)
She called back and I have let her know the response for the UDS.

## 2021-02-22 NOTE — Telephone Encounter (Signed)
Urine drug screen is positive for prescribed oxycodone metabolites, but is also positive for cocaine metabolite Benzoylecgonine as well.

## 2021-02-24 ENCOUNTER — Inpatient Hospital Stay: Payer: Medicaid Other

## 2021-02-24 ENCOUNTER — Other Ambulatory Visit: Payer: Self-pay

## 2021-02-24 VITALS — BP 122/77 | HR 85 | Temp 99.2°F | Resp 18

## 2021-02-24 DIAGNOSIS — C50412 Malignant neoplasm of upper-outer quadrant of left female breast: Secondary | ICD-10-CM

## 2021-02-24 DIAGNOSIS — Z79899 Other long term (current) drug therapy: Secondary | ICD-10-CM | POA: Diagnosis not present

## 2021-02-24 DIAGNOSIS — Z23 Encounter for immunization: Secondary | ICD-10-CM | POA: Diagnosis not present

## 2021-02-24 DIAGNOSIS — Z5189 Encounter for other specified aftercare: Secondary | ICD-10-CM | POA: Diagnosis not present

## 2021-02-24 DIAGNOSIS — Z5111 Encounter for antineoplastic chemotherapy: Secondary | ICD-10-CM | POA: Diagnosis not present

## 2021-02-24 DIAGNOSIS — Z5112 Encounter for antineoplastic immunotherapy: Secondary | ICD-10-CM | POA: Diagnosis not present

## 2021-02-24 DIAGNOSIS — Z170 Estrogen receptor positive status [ER+]: Secondary | ICD-10-CM

## 2021-02-24 MED ORDER — PEGFILGRASTIM-CBQV 6 MG/0.6ML ~~LOC~~ SOSY
6.0000 mg | PREFILLED_SYRINGE | Freq: Once | SUBCUTANEOUS | Status: AC
  Administered 2021-02-24: 6 mg via SUBCUTANEOUS
  Filled 2021-02-24: qty 0.6

## 2021-03-01 ENCOUNTER — Other Ambulatory Visit: Payer: Self-pay

## 2021-03-01 ENCOUNTER — Encounter: Payer: Self-pay | Admitting: Hematology and Oncology

## 2021-03-01 ENCOUNTER — Other Ambulatory Visit: Payer: Self-pay | Admitting: Hematology and Oncology

## 2021-03-01 DIAGNOSIS — Z419 Encounter for procedure for purposes other than remedying health state, unspecified: Secondary | ICD-10-CM | POA: Diagnosis not present

## 2021-03-01 MED ORDER — AMOXICILLIN-POT CLAVULANATE 875-125 MG PO TABS: 1.0000 | ORAL_TABLET | Freq: Two times a day (BID) | ORAL | 0 refills | Status: DC

## 2021-03-01 NOTE — Progress Notes (Signed)
Pt called and states she has a broken tooth and jaw has began to swell. Augmentin 875 1 tab po BID ordered for pt per MD. Assisted pt in finding dentist. Contact information given for one dentist. Pt states she will call them.

## 2021-03-03 ENCOUNTER — Telehealth: Payer: Self-pay | Admitting: Behavioral Health

## 2021-03-03 ENCOUNTER — Ambulatory Visit: Payer: Medicaid Other | Admitting: Behavioral Health

## 2021-03-03 ENCOUNTER — Telehealth: Payer: Self-pay | Admitting: *Deleted

## 2021-03-03 NOTE — Telephone Encounter (Signed)
Pt reports 3rd day of Sx. She would like to r/s since she is not feeling well. Clinician agreed & forwarded concerns to Triage Nsg Pool for further Eval-  Dr. Theodis Shove

## 2021-03-03 NOTE — Telephone Encounter (Signed)
-----   Message from Donnetta Hutching, [place] sent at 03/03/2021  9:43 AM EDT ----- Regarding: Sick Pt Just spoke w/Pt this am. She is sick. This is the 3rd day of Sx. She has not taken a COVID-19 test, but has one @ home.  Sx include: Cough w/mucus Fatigue Nasal congestion w/sneezing & sore throat Headache SOB on exertion   Afebrile Can smell  She has the orig COVID-19 shot; no boosters  Pt got flu shot  Can either of you call her & give further instructions?  Thx so much,  Dr. Theodis Shove

## 2021-03-03 NOTE — Telephone Encounter (Signed)
Message received from Dr Theodis Shove. Called pt - no answer; left message to call the office.

## 2021-03-07 ENCOUNTER — Other Ambulatory Visit: Payer: Self-pay | Admitting: *Deleted

## 2021-03-07 ENCOUNTER — Encounter: Payer: Self-pay | Admitting: Hematology and Oncology

## 2021-03-07 ENCOUNTER — Telehealth: Payer: Self-pay

## 2021-03-07 MED ORDER — MAGIC MOUTHWASH W/LIDOCAINE
5.0000 mL | Freq: Three times a day (TID) | ORAL | 0 refills | Status: DC | PRN
Start: 2021-03-07 — End: 2021-03-30

## 2021-03-07 MED ORDER — OXYCODONE HCL 15 MG PO TABS: 15.0000 mg | ORAL_TABLET | Freq: Three times a day (TID) | ORAL | 0 refills | Status: DC | PRN

## 2021-03-07 MED ORDER — MAGIC MOUTHWASH W/LIDOCAINE: 5.0000 mL | Freq: Three times a day (TID) | ORAL | 0 refills | Status: DC | PRN

## 2021-03-07 NOTE — Telephone Encounter (Signed)
Called pt back as she was reaching out regarding her pain meds and a magic mouthwash rx.  Informed pt that mouthwash rx has been sent to the pharmacy; however, per MD, future pain med rx's will not be filled by this office as she recently tested positive for cocaine.  Pt verbalized understanding

## 2021-03-09 ENCOUNTER — Ambulatory Visit (HOSPITAL_BASED_OUTPATIENT_CLINIC_OR_DEPARTMENT_OTHER): Payer: Medicaid Other | Attending: Physical Medicine and Rehabilitation | Admitting: Physical Therapy

## 2021-03-09 NOTE — Therapy (Incomplete)
OUTPATIENT PHYSICAL THERAPY THORACOLUMBAR EVALUATION   Patient Name: Robin Horn MRN: [id number] DOB:Dec 21, 1985, 35 y.o., female Today's Date: 03/09/2021    Past Medical History:  Diagnosis Date   Abnormal Pap smear of cervix    Anemia    Anxiety    Arthritis    Cancer ([place])    Chlamydia    Depression    Family history of breast cancer 01/18/2021   GDM (gestational diabetes mellitus)    Gonorrhea    Herpes genitalia    History of hiatal hernia    Migraines    Ovarian cyst    Polycystic kidney disease    Preeclampsia    Pregnancy induced hypertension    Past Surgical History:  Procedure Laterality Date   COLPOSCOPY     DILATION AND CURETTAGE OF UTERUS     PORT A CATH REVISION Right 01/31/2021   Procedure: PORT A CATH REVISION;  Surgeon: Rolm Bookbinder, MD;  Location: [facility];  Service: General;  Laterality: Right;   PORTACATH PLACEMENT N/A 01/06/2021   Procedure: INSERTION PORT-A-CATH;  Surgeon: Rolm Bookbinder, MD;  Location: [facility];  Service: General;  Laterality: N/A;   TONSILLECTOMY     TUBAL LIGATION Bilateral 09/24/2016   Procedure: POST PARTUM TUBAL LIGATION;  Surgeon: Lavonia Drafts, MD;  Location: [facility];  Service: Gynecology;  Laterality: Bilateral;   Patient Active Problem List   Diagnosis Date Noted   Genetic testing 02/03/2021   Family history of breast cancer 01/18/2021   Port-A-Cath in place 01/18/2021   Malignant neoplasm of upper-outer quadrant of left breast in female, estrogen receptor positive ([place]) 12/23/2020   Urinary problem 11/15/2020   Hypertension 11/15/2020   Encounter for female sterilization procedure 09/24/2016   Positive GBS test 09/23/2016   Supervision of other high risk pregnancy, antepartum 09/20/2016   Pregnancy affected by fetal growth restriction 09/20/2016   GDM (gestational diabetes mellitus) 09/11/2016   Gestational hypertension without significant proteinuria 08/29/2016    History of postpartum hemorrhage, currently pregnant 05/25/2016   Drug use affecting pregnancy, antepartum 05/25/2016   Bipolar 1 disorder ([place]) 05/25/2016   Insomnia 05/25/2016   Monochorionic diamniotic twin gestation in third trimester 04/25/2016    PCP: [name], DO  REFERRING PROVIDER: Izora Ribas, MD  REFERRING DIAG: [phone] (ICD-10-CM) - Spinal stenosis, lumbar region, with neurogenic claudication   THERAPY DIAG:  No diagnosis found.  ONSET DATE: ***  SUBJECTIVE:  SUBJECTIVE STATEMENT: *** PERTINENT HISTORY:  Breast cancer, HTN, OA, drug abuse, bipolar disorder  PAIN:  Are you having pain? {yes/no:20286} VAS scale: ***/10 Pain location: *** Pain orientation: {Pain Orientation:25161}  PAIN TYPE: {type:313116} Pain description: {PAIN DESCRIPTION:21022940}  Aggravating factors: *** Relieving factors: ***  PRECAUTIONS: {Therapy precautions:24002}  WEIGHT BEARING RESTRICTIONS {Yes ***/No:24003}  FALLS:  Has patient fallen in last 6 months? {yes/no:20286}, Number of falls: ***  LIVING ENVIRONMENT: Lives with: {[facility] lives with:25569::"lives with their family"} Lives in: {Lives in:25570} Stairs: {yes/no:20286}; {Stairs:24000} Has following equipment at home: {Assistive devices:23999}  OCCUPATION: ***  PLOF: {PLOF:24004}  PATIENT GOALS ***   OBJECTIVE:   DIAGNOSTIC FINDINGS:  ***  PATIENT SURVEYS:  FOTO ***  SCREENING FOR RED FLAGS: Bowel or bladder incontinence: {Yes/No:304960894} Spinal tumors: {Yes/No:304960894} Cauda equina syndrome: {Yes/No:304960894} Compression fracture: {Yes/No:304960894} Abdominal aneurysm: {Yes/No:304960894}  COGNITION:  Overall cognitive status: {cognition:24006}     SENSATION:  Light touch:  {intact/deficits:24005}  Stereognosis: {intact/deficits:24005}  Hot/Cold: {intact/deficits:24005}  Proprioception: {intact/deficits:24005}  MUSCLE LENGTH: Hamstrings: Right *** deg; Left *** deg Thomas test: Right *** deg; Left *** deg  POSTURE:  ***  LUMBARAROM/PROM  A/PROM A/PROM  03/09/2021  Flexion   Extension   Right lateral flexion   Left lateral flexion   Right rotation   Left rotation    (Blank rows = not tested)  LE AROM/PROM:  A/PROM Right 03/09/2021 Left 03/09/2021  Hip flexion    Hip extension    Hip abduction    Hip adduction    Hip internal rotation    Hip external rotation    Knee flexion    Knee extension    Ankle dorsiflexion    Ankle plantarflexion    Ankle inversion    Ankle eversion     (Blank rows = not tested)  LE MMT:  MMT Right 03/09/2021 Left 03/09/2021  Hip flexion    Hip extension    Hip abduction    Hip adduction    Hip internal rotation    Hip external rotation    Knee flexion    Knee extension    Ankle dorsiflexion    Ankle plantarflexion    Ankle inversion    Ankle eversion     (Blank rows = not tested)  LUMBAR SPECIAL TESTS:  {lumbar special test:25242}  SPINAL SEGMENTAL MOBILITY ASSESSMENT:  ***  FUNCTIONAL TESTS:  {Functional tests:24029}  GAIT: Distance walked: *** Assistive device utilized: {Assistive devices:23999} Level of assistance: {Levels of assistance:24026} Comments: ***    TODAY'S TREATMENT  ***   PATIENT EDUCATION:  Education details: MOI, diagnosis, prognosis, anatomy, exercise progression, DOMS expectations, muscle firing,  envelope of function, HEP, POC  Person educated: Patient Education method: Explanation, Demonstration, Tactile cues, Verbal cues, and Handouts Education comprehension: verbalized understanding, returned demonstration, verbal cues required, tactile cues required, and needs further education   HOME EXERCISE PROGRAM: ***  ASSESSMENT:  CLINICAL  IMPRESSION: Patient is a 35 y.o. female who was seen today for physical therapy evaluation and treatment for ***. Objective impairments include {opptimpairments:25111}. These impairments are limiting patient from {activity limitations:25113}. Personal factors including {Personal factors:25162} are also affecting patient's functional outcome. Patient will benefit from skilled PT to address above impairments and improve overall function.  REHAB POTENTIAL: {rehabpotential:25112}  CLINICAL DECISION MAKING: {clinical decision making:25114}  EVALUATION COMPLEXITY: {Evaluation complexity:25115}   GOALS:   SHORT TERM GOALS:  STG Name Target Date Goal status  1 Pt will become independent with HEP in order to demonstrate synthesis of PT education.  Baseline:  {follow up:25551} {GOALSTATUS:25110}  2 *** Baseline:  {follow up:25551} {GOALSTATUS:25110}  3 *** Baseline: {follow up:25551} {GOALSTATUS:25110}  4 *** Baseline: {follow up:25551} {GOALSTATUS:25110}   LONG TERM GOALS:   LTG Name Target Date Goal status  1 Pt  will become independent with final HEP in order to demonstrate synthesis of PT education.  Baseline: {follow up:25551} {GOALSTATUS:25110}  2 *** Baseline: {follow up:25551} {GOALSTATUS:25110}  3 *** Baseline: {follow up:25551} {GOALSTATUS:25110}  4 *** Baseline: {follow up:25551} {GOALSTATUS:25110}   PLAN: PT FREQUENCY: {rehab frequency:25116}  PT DURATION: {rehab duration:25117}  PLANNED INTERVENTIONS: {rehab planned interventions:25118::"Therapeutic exercises","Therapeutic activity","Neuro Muscular re-education","Balance training","Gait training","Patient/Family education","Joint mobilization"}  PLAN FOR NEXT SESSION: ***  Daleen Bo PT, DPT 03/09/21 9:47 AM

## 2021-03-14 MED FILL — Fosaprepitant Dimeglumine For IV Infusion 150 MG (Base Eq): INTRAVENOUS | Qty: 5 | Status: AC

## 2021-03-14 MED FILL — Dexamethasone Sodium Phosphate Inj 100 MG/10ML: INTRAMUSCULAR | Qty: 1 | Status: AC

## 2021-03-14 NOTE — Progress Notes (Incomplete)
Patient Care Team: [name], DO as PCP - General (Internal Medicine) Claudia Pollock, RN as Registered Nurse Dayna Barker, MD as Consulting Physician (General Surgery) Mauro Kaufmann, RN as Oncology Nurse Navigator Rockwell Germany, RN as Oncology Nurse Navigator  DIAGNOSIS: No diagnosis found.  SUMMARY OF ONCOLOGIC HISTORY: Oncology History  Left breast lump (Resolved)  Malignant neoplasm of upper-outer quadrant of left breast in female, estrogen receptor positive ([place])  12/17/2020 Initial Diagnosis   Palpable lump in the upper outer left breast. 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, Her2+ (3+)/ER+ (80%)/PR- (<1%) in the left breast, and invasive ductal carcinoma, Her2+ (3+)/ER+ (80%)/PR+ (3%) in the left axillary lymph node   12/23/2020 Cancer Staging   Staging form: Breast, AJCC 8th Edition - Clinical stage from 12/23/2020: Stage IB (cT2, cN1, cM0, G3, ER+, PR+, HER2+) - Signed by [name], MD on 12/23/2020 Stage prefix: Initial diagnosis Histologic grading system: 3 grade system    01/11/2021 -  Chemotherapy   Patient is on Treatment Plan : BREAST  Docetaxel + Carboplatin + Trastuzumab + Pertuzumab  (TCHP) q21d      01/31/2021 Genetic Testing   Negative hereditary cancer genetic testing: no pathogenic variants detected in [facility] Common Hereditary Cancers +RNA Panel.  The report date is January 31, 2021.    The Common Hereditary Cancers + RNA Panel offered by [facility] includes sequencing, deletion/duplication, and RNA testing of the following 47 genes: APC, ATM, AXIN2, BARD1, BMPR1A, BRCA1, BRCA2, BRIP1, CDH1, CDK4*, CDKN2A (p14ARF)*, CDKN2A (p16INK4a)*, CHEK2, CTNNA1, DICER1, EPCAM (Deletion/duplication testing only), GREM1 (promoter region deletion/duplication  testing only), KIT, MEN1, MLH1, MSH2, MSH3, MSH6, MUTYH, NBN, NF1, NHTL1, PALB2, PDGFRA*, PMS2, POLD1, POLE, PTEN, RAD50, RAD51C, RAD51D, SDHB, SDHC, SDHD, SMAD4, SMARCA4. STK11, TP53, TSC1, TSC2, and VHL.  The following genes were evaluated for sequence changes only: SDHA and HOXB13 c.251G>A variant only.  RNA analysis is not performed for the * genes.       CHIEF COMPLIANT: Cycle 4 TCH Perjeta   INTERVAL HISTORY: Robin Horn is a 35 y.o. with above-mentioned history of left breast cancer, currently on chemotherapy with [place]. She presents to the clinic today for treatment.   ALLERGIES:  is allergic to tramadol, hydrocodone-acetaminophen, and lactose intolerance (gi).  MEDICATIONS:  Current Outpatient Medications  Medication Sig Dispense Refill   acetaminophen (TYLENOL) 325 MG tablet Take 500 mg by mouth every 6 (six) hours as needed.     amoxicillin-clavulanate (AUGMENTIN) 875-125 MG tablet Take 1 tablet by mouth 2 (two) times daily. 14 tablet 0   cetirizine (ZYRTEC) 10 MG tablet Take 10 mg by mouth daily.     lidocaine-prilocaine (EMLA) cream Apply to affected area once 30 g 3   lisinopril (ZESTRIL) 10 MG tablet Take 10 mg by mouth daily.     magic mouthwash w/lidocaine SOLN Take 5 mLs by mouth 3 (three) times daily as needed for mouth pain. 200 mL 0   medroxyPROGESTERone Acetate 150 MG/ML SUSY Inject into the muscle.     ondansetron (ZOFRAN) 8 MG tablet Take 1 tablet (8 mg total) by mouth 2 (two) times daily as needed (Nausea or vomiting). Start on the third day after chemotherapy. 30 tablet 1   oxyCODONE (ROXICODONE) 15 MG immediate release tablet Take 1 tablet (15 mg total) by mouth every 8 (  eight) hours as needed for pain. 90 tablet 0   prochlorperazine (COMPAZINE) 10 MG tablet Take 1 tablet (10 mg total) by mouth every 6 (six) hours as needed (Nausea or vomiting). 30 tablet 1   QUEtiapine (SEROQUEL) 100 MG tablet Take 1 tablet (100 mg total) by mouth at bedtime. 30 tablet 3    tiZANidine (ZANAFLEX) 4 MG tablet Take 4 mg by mouth 3 (three) times daily.     valACYclovir (VALTREX) 1000 MG tablet Take 1,000 mg by mouth daily.     zolpidem (AMBIEN) 5 MG tablet Take 1 tablet (5 mg total) by mouth at bedtime as needed. 30 tablet 3   No current facility-administered medications for this visit.    PHYSICAL EXAMINATION: ECOG PERFORMANCE STATUS: {CHL ONC ECOG PS:[phone]}  There were no vitals filed for this visit. There were no vitals filed for this visit.  LABORATORY DATA:  I have reviewed the data as listed CMP Latest Ref Rng & Units 02/22/2021 02/01/2021 01/25/2021  Glucose 70 - 99 mg/dL 103(H) 171(H) 90  BUN 6 - 20 mg/dL 14 26(H) 19  Creatinine 0.44 - 1.00 mg/dL 1.23(H) 1.44(H) 1.22(H)  Sodium 135 - 145 mmol/L 142 140 139  Potassium 3.5 - 5.1 mmol/L 3.6 4.1 3.6  Chloride 98 - 111 mmol/L 105 108 104  CO2 22 - 32 mmol/L 25 22 28   Calcium 8.9 - 10.3 mg/dL 9.5 9.2 9.8  Total Protein 6.5 - 8.1 g/dL 7.2 6.8 -  Total Bilirubin 0.3 - 1.2 mg/dL 0.3 <0.2(L) -  Alkaline Phos 38 - 126 U/L 73 66 -  AST 15 - 41 U/L 11(L) 14(L) -  ALT 0 - 44 U/L 12 14 -    Lab Results  Component Value Date   WBC 8.0 02/22/2021   HGB 9.4 (L) 02/22/2021   HCT 28.7 (L) 02/22/2021   MCV 87.0 02/22/2021   PLT 213 02/22/2021   NEUTROABS 4.8 02/22/2021    ASSESSMENT & PLAN:  No problem-specific Assessment & Plan notes found for this encounter.    No orders of the defined types were placed in this encounter.  The patient has a good understanding of the overall plan. she agrees with it. she will call with any problems that may develop before the next visit here.  Total time spent: *** mins including face to face time and time spent for planning, charting and coordination of care  Rulon Eisenmenger, MD, MPH 03/14/2021  I, Thana Ates, am acting as scribe for Dr. [name].  {insert scribe attestation}

## 2021-03-15 ENCOUNTER — Encounter: Payer: Self-pay | Admitting: *Deleted

## 2021-03-15 ENCOUNTER — Inpatient Hospital Stay: Payer: Medicaid Other

## 2021-03-15 ENCOUNTER — Inpatient Hospital Stay: Payer: Medicaid Other | Admitting: Hematology and Oncology

## 2021-03-15 NOTE — Progress Notes (Signed)
Patient Care Team: [name], DO as PCP - General (Internal Medicine) Claudia Pollock, RN as Registered Nurse Dayna Barker, MD as Consulting Physician (General Surgery) Mauro Kaufmann, RN as Oncology Nurse Navigator Rockwell [place], RN as Oncology Nurse Navigator  DIAGNOSIS:    ICD-10-CM   1. Malignant neoplasm of upper-outer quadrant of left breast in female, estrogen receptor positive ([place])  C50.412    Z17.0       SUMMARY OF ONCOLOGIC HISTORY: Oncology History  Left breast lump (Resolved)  Malignant neoplasm of upper-outer quadrant of left breast in female, estrogen receptor positive ([place])  12/17/2020 Initial Diagnosis   Palpable lump in the upper outer left breast. 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, Her2+ (3+)/ER+ (80%)/PR- (<1%) in the left breast, and invasive ductal carcinoma, Her2+ (3+)/ER+ (80%)/PR+ (3%) in the left axillary lymph node   12/23/2020 Cancer Staging   Staging form: Breast, AJCC 8th Edition - Clinical stage from 12/23/2020: Stage IB (cT2, cN1, cM0, G3, ER+, PR+, HER2+) - Signed by [name], MD on 12/23/2020 Stage prefix: Initial diagnosis Histologic grading system: 3 grade system    01/11/2021 -  Chemotherapy   Patient is on Treatment Plan : BREAST  Docetaxel + Carboplatin + Trastuzumab + Pertuzumab  (TCHP) q21d      01/31/2021 Genetic Testing   Negative hereditary cancer genetic testing: no pathogenic variants detected in [facility] Common Hereditary Cancers +RNA Panel.  The report date is January 31, 2021.    The Common Hereditary Cancers + RNA Panel offered by [facility] includes sequencing, deletion/duplication, and RNA testing of the following 47 genes: APC, ATM, AXIN2, BARD1, BMPR1A, BRCA1, BRCA2, BRIP1, CDH1, CDK4*, CDKN2A (p14ARF)*,  CDKN2A (p16INK4a)*, CHEK2, CTNNA1, DICER1, EPCAM (Deletion/duplication testing only), GREM1 (promoter region deletion/duplication testing only), KIT, MEN1, MLH1, MSH2, MSH3, MSH6, MUTYH, NBN, NF1, NHTL1, PALB2, PDGFRA*, PMS2, POLD1, POLE, PTEN, RAD50, RAD51C, RAD51D, SDHB, SDHC, SDHD, SMAD4, SMARCA4. STK11, TP53, TSC1, TSC2, and VHL.  The following genes were evaluated for sequence changes only: SDHA and HOXB13 c.251G>A variant only.  RNA analysis is not performed for the * genes.       CHIEF COMPLIANT: Cycle 4 TCH Perjeta   INTERVAL HISTORY: Robin Horn is a 35 y.o. with above-mentioned history of left breast cancer, currently on chemotherapy with [place]. She presents to the clinic today for treatment.  Her major complaints are related to dental pain related to poor dentition.  She saw her dentist who recommended teeth extraction but would not do it because of insurance reasons.  Patient has Medicaid.  ALLERGIES:  is allergic to tramadol, hydrocodone-acetaminophen, and lactose intolerance (gi).  MEDICATIONS:  Current Outpatient Medications  Medication Sig Dispense Refill   acetaminophen (TYLENOL) 325 MG tablet Take 500 mg by mouth every 6 (six) hours as needed.     amoxicillin-clavulanate (AUGMENTIN) 875-125 MG tablet Take 1 tablet by mouth 2 (two) times daily. 14 tablet 0   cetirizine (ZYRTEC) 10 MG tablet Take 10 mg by mouth daily.     lidocaine-prilocaine (EMLA) cream Apply to affected area once 30 g 3   lisinopril (ZESTRIL) 10 MG tablet Take 10 mg by mouth daily.     magic mouthwash w/lidocaine SOLN Take 5 mLs by mouth 3 (three) times daily as needed for mouth pain. 200 mL 0   medroxyPROGESTERone  Acetate 150 MG/ML SUSY Inject into the muscle.     ondansetron (ZOFRAN) 8 MG tablet Take 1 tablet (8 mg total) by mouth 2 (two) times daily as needed (Nausea or vomiting). Start on the third day after chemotherapy. 30 tablet 1   oxyCODONE (ROXICODONE) 15 MG immediate release tablet Take 1  tablet (15 mg total) by mouth every 8 (eight) hours as needed for pain. 90 tablet 0   prochlorperazine (COMPAZINE) 10 MG tablet Take 1 tablet (10 mg total) by mouth every 6 (six) hours as needed (Nausea or vomiting). 30 tablet 1   QUEtiapine (SEROQUEL) 100 MG tablet Take 1 tablet (100 mg total) by mouth at bedtime. 30 tablet 3   tiZANidine (ZANAFLEX) 4 MG tablet Take 4 mg by mouth 3 (three) times daily.     valACYclovir (VALTREX) 1000 MG tablet Take 1,000 mg by mouth daily.     zolpidem (AMBIEN) 5 MG tablet Take 1 tablet (5 mg total) by mouth at bedtime as needed. 30 tablet 3   No current facility-administered medications for this visit.    PHYSICAL EXAMINATION: ECOG PERFORMANCE STATUS: 1 - Symptomatic but completely ambulatory  Vitals:   03/16/21 1051  BP: 137/88  Pulse: (!) 118  Resp: 18  Temp: (!) 97.3 F (36.3 C)  SpO2: 100%   Filed Weights   03/16/21 1051  Weight: 160 lb 3.2 oz (72.7 kg)    LABORATORY DATA:  I have reviewed the data as listed CMP Latest Ref Rng & Units 02/22/2021 02/01/2021 01/25/2021  Glucose 70 - 99 mg/dL 103(H) 171(H) 90  BUN 6 - 20 mg/dL 14 26(H) 19  Creatinine 0.44 - 1.00 mg/dL 1.23(H) 1.44(H) 1.22(H)  Sodium 135 - 145 mmol/L 142 140 139  Potassium 3.5 - 5.1 mmol/L 3.6 4.1 3.6  Chloride 98 - 111 mmol/L 105 108 104  CO2 22 - 32 mmol/L _0 Calcium 8.9 - 10.3 mg/dL 9.5 9.2 9.8  Total Protein 6.5 - 8.1 g/dL 7.2 6.8 -  Total Bilirubin 0.3 - 1.2 mg/dL 0.3 <0.2(L) -  Alkaline Phos 38 - 126 U/L 73 66 -  AST 15 - 41 U/L 11(L) 14(L) -  ALT 0 - 44 U/L 12 14 -    Lab Results  Component Value Date   WBC 8.0 02/22/2021   HGB 9.4 (L) 02/22/2021   HCT 28.7 (L) 02/22/2021   MCV 87.0 02/22/2021   PLT 213 02/22/2021   NEUTROABS 4.8 02/22/2021    ASSESSMENT & PLAN:  Malignant neoplasm of upper-outer quadrant of left breast in female, estrogen receptor positive ([place]) 12/17/2020: Palpable lump in the upper outer left breast. 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 Neoadjuvant chemotherapy with TCH Perjeta 6 cycles followed by Herceptin Perjeta maintenance versus Kadcyla maintenance (based on response to neoadjuvant chemo) for 1 year 2. Followed by breast conserving surgery versus mastectomy if possible with targeted node dissection 3. Followed by adjuvant radiation therapy  4.  Followed by adjuvant antiestrogen therapy -------------------------------------------------------------------------------------------------------------------------------------------------- Current treatment: Cycle 4 TCH Perjeta Echocardiogram 01/10/2021: EF 55 to 60   Chemo toxicities: Severe sores in the mouth: I resolved. Severe fatigue Poor appetite and  dehydration: Previously received IV fluids.  Encouraged to drink more water.  She is able to drink Gatorade and flavored drinks. Reduced the dosage of cycle 2 chemotherapy.   Chemo induced anemia: Hemoglobin is 9.4 monitoring closely.   Chronic pain: I renewed her prescription for oxycodone 52m.  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  I instructed her that we do not like to prescribe pain medications for someone who is using recreational drugs.  Therefore we will not renew her narcotic prescription at this time any further.    D[facility] issues: [facility].  Return to clinic in 3 weeks for cycle 5      No orders of the defined types were placed in this encounter.  The  patient has a good understanding of the overall plan. she agrees with it. she will call with any problems that may develop before the next visit here.  Total time spent: 30 mins including face to face time and time spent for planning, charting and coordination of care  VRulon Eisenmenger MD, MPH 03/16/2021  I, KThana Ates am acting as scribe for Dr. VNicholas Lose  I have reviewed the above documentation for accuracy and completeness, and I agree with the above.

## 2021-03-15 NOTE — Assessment & Plan Note (Deleted)
12/17/2020:Palpable lump in the upper outer left breast.Diagnostic mammogram and [place] on08/17/22showed multiple suspicious masses, 2abnormal lymph nodes with increased cortical thickness,1 lymph node with borderline abnormal cortical thickness,Biopsy on08/19/22showed invasive ductal carcinoma and DCIS, Her2+ (3+)/ER+ (80%)/PR- (<1%) in the left breast, and invasive ductal carcinoma, Her2+ (3+)/ER+ (80%)/PR+ (3%) in the left axillary lymph node T1CN1A stage Ib  Treatment plan: 1. Neoadjuvant chemotherapy with TCH Perjeta 6 cycles followed by Herceptin Perjeta maintenance versus Kadcyla maintenance (based on response to neoadjuvant chemo) for 1 year 2. Followed by breast conserving surgeryversus mastectomyif possible with targeted node dissection 3. Followed by adjuvant radiation therapy 4.Followed by adjuvant antiestrogen therapy -------------------------------------------------------------------------------------------------------------------------------------------------- Current treatment: Cycle4TCH Perjeta Echocardiogram 01/10/2021: EF 55 to 60  Chemo toxicities: 1. Severe sores in the mouth: Iresolved. 2. Severe fatigue 3. Poor appetite and dehydration:Previously received IV fluids.  Encouraged to drink more water.  She is able to drink Gatorade and flavored drinks. 4. Reducedthe dosage of cycle 2 chemotherapy.  5. Chemo induced anemia: Hemoglobin is 9.4 monitoring closely.  Chronic pain: I renewed her prescription for oxycodone 73m. She has an appointment with a pain physician and will subsequently receive her pain regimen through them.  Return to clinic in3weeks for cycle 5

## 2021-03-16 ENCOUNTER — Other Ambulatory Visit: Payer: Self-pay | Admitting: *Deleted

## 2021-03-16 ENCOUNTER — Inpatient Hospital Stay: Payer: Medicaid Other

## 2021-03-16 ENCOUNTER — Inpatient Hospital Stay: Payer: Medicaid Other | Attending: Hematology and Oncology

## 2021-03-16 ENCOUNTER — Other Ambulatory Visit: Payer: Self-pay

## 2021-03-16 ENCOUNTER — Inpatient Hospital Stay (HOSPITAL_BASED_OUTPATIENT_CLINIC_OR_DEPARTMENT_OTHER): Payer: Medicaid Other | Admitting: Hematology and Oncology

## 2021-03-16 VITALS — BP 123/64 | HR 89 | Temp 98.2°F | Resp 16

## 2021-03-16 DIAGNOSIS — Z803 Family history of malignant neoplasm of breast: Secondary | ICD-10-CM | POA: Diagnosis not present

## 2021-03-16 DIAGNOSIS — Z170 Estrogen receptor positive status [ER+]: Secondary | ICD-10-CM | POA: Diagnosis not present

## 2021-03-16 DIAGNOSIS — Z79899 Other long term (current) drug therapy: Secondary | ICD-10-CM | POA: Diagnosis not present

## 2021-03-16 DIAGNOSIS — Z5111 Encounter for antineoplastic chemotherapy: Secondary | ICD-10-CM | POA: Insufficient documentation

## 2021-03-16 DIAGNOSIS — Z5189 Encounter for other specified aftercare: Secondary | ICD-10-CM | POA: Insufficient documentation

## 2021-03-16 DIAGNOSIS — M79605 Pain in left leg: Secondary | ICD-10-CM | POA: Insufficient documentation

## 2021-03-16 DIAGNOSIS — Z5112 Encounter for antineoplastic immunotherapy: Secondary | ICD-10-CM | POA: Insufficient documentation

## 2021-03-16 DIAGNOSIS — Z720 Tobacco use: Secondary | ICD-10-CM | POA: Diagnosis not present

## 2021-03-16 DIAGNOSIS — M79604 Pain in right leg: Secondary | ICD-10-CM | POA: Insufficient documentation

## 2021-03-16 DIAGNOSIS — C50412 Malignant neoplasm of upper-outer quadrant of left female breast: Secondary | ICD-10-CM

## 2021-03-16 DIAGNOSIS — K0889 Other specified disorders of teeth and supporting structures: Secondary | ICD-10-CM | POA: Insufficient documentation

## 2021-03-16 LAB — CMP (CANCER CENTER ONLY)
ALT: 9 U/L (ref 0–44)
AST: 9 U/L — ABNORMAL LOW (ref 15–41)
Albumin: 3.4 g/dL — ABNORMAL LOW (ref 3.5–5.0)
Alkaline Phosphatase: 73 U/L (ref 38–126)
Anion gap: 10 (ref 5–15)
BUN: 10 mg/dL (ref 6–20)
CO2: 24 mmol/L (ref 22–32)
Calcium: 8.9 mg/dL (ref 8.9–10.3)
Chloride: 105 mmol/L (ref 98–111)
Creatinine: 1.19 mg/dL — ABNORMAL HIGH (ref 0.44–1.00)
GFR, Estimated: >60 mL/min (ref >60)
Glucose, Bld: 129 mg/dL — ABNORMAL HIGH (ref 70–99)
Potassium: 3.2 mmol/L — ABNORMAL LOW (ref 3.5–5.1)
Sodium: 139 mmol/L (ref 135–145)
Total Bilirubin: <0.2 mg/dL — ABNORMAL LOW (ref 0.3–1.2)
Total Protein: 6.6 g/dL (ref 6.5–8.1)

## 2021-03-16 LAB — CBC WITH DIFFERENTIAL (CANCER CENTER ONLY)
Abs Immature Granulocytes: 0.02 10*3/uL (ref 0.00–0.07)
Basophils Absolute: 0 10*3/uL (ref 0.0–0.1)
Basophils Relative: 0 %
Eosinophils Absolute: 0.1 10*3/uL (ref 0.0–0.5)
Eosinophils Relative: 2 %
HCT: 28.1 % — ABNORMAL LOW (ref 36.0–46.0)
Hemoglobin: 9 g/dL — ABNORMAL LOW (ref 12.0–15.0)
Immature Granulocytes: 0 %
Lymphocytes Relative: 25 %
Lymphs Abs: 1.6 10*3/uL (ref 0.7–4.0)
MCH: 28.7 pg (ref 26.0–34.0)
MCHC: 32 g/dL (ref 30.0–36.0)
MCV: 89.5 fL (ref 80.0–100.0)
Monocytes Absolute: 0.5 10*3/uL (ref 0.1–1.0)
Monocytes Relative: 8 %
Neutro Abs: 4 10*3/uL (ref 1.7–7.7)
Neutrophils Relative %: 65 %
Platelet Count: 206 10*3/uL (ref 150–400)
RBC: 3.14 MIL/uL — ABNORMAL LOW (ref 3.87–5.11)
RDW: 17.6 % — ABNORMAL HIGH (ref 11.5–15.5)
WBC Count: 6.1 10*3/uL (ref 4.0–10.5)
nRBC: 0 % (ref 0.0–0.2)

## 2021-03-16 MED ORDER — SODIUM CHLORIDE 0.9% FLUSH
10.0000 mL | INTRAVENOUS | Status: DC | PRN
  Administered 2021-03-16: 10 mL

## 2021-03-16 MED ORDER — SODIUM CHLORIDE 0.9 % IV SOLN
420.0000 mg | Freq: Once | INTRAVENOUS | Status: AC
  Administered 2021-03-16: 420 mg via INTRAVENOUS
  Filled 2021-03-16: qty 14

## 2021-03-16 MED ORDER — HEPARIN SOD (PORK) LOCK FLUSH 100 UNIT/ML IV SOLN
500.0000 [IU] | Freq: Once | INTRAVENOUS | Status: AC | PRN
  Administered 2021-03-16: 500 [IU]

## 2021-03-16 MED ORDER — SODIUM CHLORIDE 0.9 % IV SOLN
Freq: Once | INTRAVENOUS | Status: AC
Start: 2021-03-16 — End: 2021-03-16

## 2021-03-16 MED ORDER — SODIUM CHLORIDE 0.9 % IV SOLN
490.0000 mg | Freq: Once | INTRAVENOUS | Status: AC
  Administered 2021-03-16: 490 mg via INTRAVENOUS
  Filled 2021-03-16: qty 49

## 2021-03-16 MED ORDER — PALONOSETRON HCL INJECTION 0.25 MG/5ML
0.2500 mg | Freq: Once | INTRAVENOUS | Status: AC
  Administered 2021-03-16: 0.25 mg via INTRAVENOUS
  Filled 2021-03-16: qty 5

## 2021-03-16 MED ORDER — ACETAMINOPHEN 325 MG PO TABS
650.0000 mg | ORAL_TABLET | Freq: Once | ORAL | Status: AC
  Administered 2021-03-16: 650 mg via ORAL
  Filled 2021-03-16: qty 2

## 2021-03-16 MED ORDER — SODIUM CHLORIDE 0.9 % IV SOLN
10.0000 mg | Freq: Once | INTRAVENOUS | Status: AC
  Administered 2021-03-16: 10 mg via INTRAVENOUS
  Filled 2021-03-16: qty 10

## 2021-03-16 MED ORDER — DIPHENHYDRAMINE HCL 25 MG PO CAPS
25.0000 mg | ORAL_CAPSULE | Freq: Once | ORAL | Status: AC
  Administered 2021-03-16: 25 mg via ORAL
  Filled 2021-03-16: qty 1

## 2021-03-16 MED ORDER — SODIUM CHLORIDE 0.9 % IV SOLN
65.0000 mg/m2 | Freq: Once | INTRAVENOUS | Status: AC
  Administered 2021-03-16: 110 mg via INTRAVENOUS
  Filled 2021-03-16: qty 11

## 2021-03-16 MED ORDER — TRASTUZUMAB-DKST CHEMO 150 MG IV SOLR
450.0000 mg | Freq: Once | INTRAVENOUS | Status: AC
  Administered 2021-03-16: 450 mg via INTRAVENOUS
  Filled 2021-03-16: qty 21.43

## 2021-03-16 MED ORDER — SODIUM CHLORIDE 0.9 % IV SOLN
150.0000 mg | Freq: Once | INTRAVENOUS | Status: AC
  Administered 2021-03-16: 150 mg via INTRAVENOUS
  Filled 2021-03-16: qty 150

## 2021-03-16 MED ORDER — OXYCODONE HCL 5 MG PO TABS
5.0000 mg | ORAL_TABLET | Freq: Once | ORAL | Status: AC
  Administered 2021-03-16: 5 mg via ORAL
  Filled 2021-03-16: qty 1

## 2021-03-16 MED ORDER — ACETAMINOPHEN 325 MG PO TABS
325.0000 mg | ORAL_TABLET | Freq: Once | ORAL | Status: AC
  Administered 2021-03-16: 325 mg via ORAL
  Filled 2021-03-16: qty 1

## 2021-03-16 NOTE — Progress Notes (Signed)
Patient complaining of mouth/throat pain due to dental issue. Dr. Lindi Adie notified and verbal order given for 5mg  percocet. MD aware that patient had 650mg  tylenol as premed today.

## 2021-03-16 NOTE — Patient Instructions (Signed)
[facility] MEDICAL [facility]COLOG[facility]  Discharge Instructions: Thank you for choosing [facility] to provide your oncology and hematology care.   If you have a lab appointment with the Cancer Center, please go directly to the Cancer Center and check in at the registration area.   Wear comfortable clothing and clothing appropriate for easy access to any Portacath or PICC line.   We strive to give you quality time with your provider. You may need to reschedule your appointment if you arrive late (15 or more minutes).  Arriving late affects you and other patients whose appointments are after yours.  Also, if you miss three or more appointments without notifying the office, you may be dismissed from the clinic at the provider's discretion.      For prescription refill requests, have your pharmacy contact our office and allow 72 hours for refills to be completed.    Today you received the following chemotherapy and/or immunotherapy agents Trastuzumab, pertuzumab, docetaxel, carboplatin   To help prevent nausea and vomiting after your treatment, we encourage you to take your nausea medication as directed.  BELOW ARE SYMPTOMS THAT SHOULD BE REPORTED IMMEDIATELY: *FEVER GREATER THAN 100.4 F (38 C) OR HIGHER *CHILLS OR SWEATING *NAUSEA AND VOMITING THAT IS NOT CONTROLLED WITH YOUR NAUSEA MEDICATION *UNUSUAL SHORTNESS OF BREATH *UNUSUAL BRUISING OR BLEEDING *URINARY PROBLEMS (pain or burning when urinating, or frequent urination) *BOWEL PROBLEMS (unusual diarrhea, constipation, pain near the anus) TENDERNESS IN MOUTH AND THROAT WITH OR WITHOUT PRESENCE OF ULCERS (sore throat, sores in mouth, or a toothache) UNUSUAL RASH, SWELLING OR PAIN  UNUSUAL VAGINAL DISCHARGE OR ITCHING   Items with * indicate a potential emergency and should be followed up as soon as possible or go to the Emergency Department if any problems should occur.  Please show the CHEMOTHERAPY ALERT CARD or  IMMUNOTHERAPY ALERT CARD at check-in to the Emergency Department and triage nurse.  Should you have questions after your visit or need to cancel or reschedule your appointment, please contact [facility]  Dept: [phone]  and follow the prompts.  Office hours are 8:00 a.m. to 4:30 p.m. Monday - Friday. Please note that voicemails left after 4:00 p.m. may not be returned until the following business day.  We are closed weekends and major holidays. You have access to a nurse at all times for urgent questions. Please call the main number to the clinic Dept: [phone] and follow the prompts.   For any non-urgent questions, you may also contact your provider using MyChart. We now offer e-Visits for anyone 18 and older to request care online for non-urgent symptoms. For details visit [email or web address].   Also download the MyChart app! Go to the app store, search "MyChart", open the app, select [facility], and log in with your MyChart username and password.  Due to Covid, a mask is required upon entering the hospital/clinic. If you do not have a mask, one will be given to you upon arrival. For doctor visits, patients may have 1 support person aged 18 or older with them. For treatment visits, patients cannot have anyone with them due to current Covid guidelines and our immunocompromised population.  

## 2021-03-16 NOTE — Progress Notes (Signed)
Ok to premed without lab results per Dr Lindi Adie.

## 2021-03-16 NOTE — Progress Notes (Signed)
Per MD request RN placed referral to [facility] with Dr. Teena Dunk DDS for evaluation and tx of poor dental hygiene. RN placed call to Dr. Teena Dunk and LVM for office to evaluate referral and schedule pt.

## 2021-03-16 NOTE — Assessment & Plan Note (Signed)
12/17/2020:Palpable lump in the upper outer left breast.Diagnostic mammogram and [place] on08/17/22showed multiple suspicious masses, 2abnormal lymph nodes with increased cortical thickness,1 lymph node with borderline abnormal cortical thickness,Biopsy on08/19/22showed invasive ductal carcinoma and DCIS, Her2+ (3+)/ER+ (80%)/PR- (<1%) in the left breast, and invasive ductal carcinoma, Her2+ (3+)/ER+ (80%)/PR+ (3%) in the left axillary lymph node T1CN1A stage Ib  Treatment plan: 1. Neoadjuvant chemotherapy with TCH Perjeta 6 cycles followed by Herceptin Perjeta maintenance versus Kadcyla maintenance (based on response to neoadjuvant chemo) for 1 year 2. Followed by breast conserving surgeryversus mastectomyif possible with targeted node dissection 3. Followed by adjuvant radiation therapy 4.Followed by adjuvant antiestrogen therapy -------------------------------------------------------------------------------------------------------------------------------------------------- Current treatment: Cycle4TCH Perjeta Echocardiogram 01/10/2021: EF 55 to 60  Chemo toxicities: 1. Severe sores in the mouth: Iresolved. 2. Severe fatigue 3. Poor appetite and dehydration:Previously received IV fluids.  Encouraged to drink more water.  She is able to drink Gatorade and flavored drinks. 4. Reducedthe dosage of cycle 2 chemotherapy.  5. Chemo induced anemia: Hemoglobin is 9.4 monitoring closely.  Chronic pain: I renewed her prescription for oxycodone 70m. She has an appointment with a pain physician and will subsequently receive her pain regimen through them.  Return to clinic in3weeks for cycle 5

## 2021-03-17 ENCOUNTER — Encounter: Payer: Self-pay | Admitting: *Deleted

## 2021-03-17 ENCOUNTER — Ambulatory Visit: Payer: Medicaid Other

## 2021-03-17 NOTE — Progress Notes (Signed)
Received call from [place] with Dr. Judye Bos DDS office stating referral has been received and pt will be scheduled after the Thanksgiving holiday for evaluation and tx of poor dental health.

## 2021-03-18 ENCOUNTER — Inpatient Hospital Stay: Payer: Medicaid Other

## 2021-03-18 ENCOUNTER — Encounter: Payer: Medicaid Other | Attending: Physical Medicine and Rehabilitation | Admitting: Registered Nurse

## 2021-03-18 DIAGNOSIS — G894 Chronic pain syndrome: Secondary | ICD-10-CM | POA: Insufficient documentation

## 2021-03-18 DIAGNOSIS — Z79899 Other long term (current) drug therapy: Secondary | ICD-10-CM | POA: Insufficient documentation

## 2021-03-18 DIAGNOSIS — Z5181 Encounter for therapeutic drug level monitoring: Secondary | ICD-10-CM | POA: Insufficient documentation

## 2021-03-18 DIAGNOSIS — M48062 Spinal stenosis, lumbar region with neurogenic claudication: Secondary | ICD-10-CM | POA: Insufficient documentation

## 2021-03-21 ENCOUNTER — Other Ambulatory Visit: Payer: Self-pay

## 2021-03-21 ENCOUNTER — Inpatient Hospital Stay: Payer: Medicaid Other

## 2021-03-21 VITALS — BP 120/89 | HR 107 | Temp 98.9°F | Resp 16

## 2021-03-21 DIAGNOSIS — Z720 Tobacco use: Secondary | ICD-10-CM | POA: Diagnosis not present

## 2021-03-21 DIAGNOSIS — M79604 Pain in right leg: Secondary | ICD-10-CM | POA: Diagnosis not present

## 2021-03-21 DIAGNOSIS — Z5111 Encounter for antineoplastic chemotherapy: Secondary | ICD-10-CM | POA: Diagnosis not present

## 2021-03-21 DIAGNOSIS — Z79899 Other long term (current) drug therapy: Secondary | ICD-10-CM | POA: Diagnosis not present

## 2021-03-21 DIAGNOSIS — M79605 Pain in left leg: Secondary | ICD-10-CM | POA: Diagnosis not present

## 2021-03-21 DIAGNOSIS — C50412 Malignant neoplasm of upper-outer quadrant of left female breast: Secondary | ICD-10-CM

## 2021-03-21 DIAGNOSIS — Z5189 Encounter for other specified aftercare: Secondary | ICD-10-CM | POA: Diagnosis not present

## 2021-03-21 DIAGNOSIS — Z803 Family history of malignant neoplasm of breast: Secondary | ICD-10-CM | POA: Diagnosis not present

## 2021-03-21 DIAGNOSIS — Z5112 Encounter for antineoplastic immunotherapy: Secondary | ICD-10-CM | POA: Diagnosis not present

## 2021-03-21 DIAGNOSIS — K0889 Other specified disorders of teeth and supporting structures: Secondary | ICD-10-CM | POA: Diagnosis not present

## 2021-03-21 MED ORDER — PEGFILGRASTIM-CBQV 6 MG/0.6ML ~~LOC~~ SOSY
6.0000 mg | PREFILLED_SYRINGE | Freq: Once | SUBCUTANEOUS | Status: AC
  Administered 2021-03-21: 6 mg via SUBCUTANEOUS
  Filled 2021-03-21: qty 0.6

## 2021-03-28 ENCOUNTER — Ambulatory Visit: Payer: Medicaid Other | Admitting: Behavioral Health

## 2021-03-28 ENCOUNTER — Telehealth: Payer: Self-pay | Admitting: Behavioral Health

## 2021-03-28 NOTE — Telephone Encounter (Signed)
Lft msgs for Pt re: today's 30 min telehealth session. Suggested Pt RC to [facility] & r/s @ her convenience.   Dr. Theodis Shove

## 2021-03-29 ENCOUNTER — Telehealth: Payer: Self-pay | Admitting: *Deleted

## 2021-03-29 NOTE — Telephone Encounter (Signed)
Received call from pt with complaint of radiating spinal pain and facial swelling x several days and unable to fully open her mouth. Pt also complaint of left lower extremity swelling, denies redness, warmth or recent injury. Per MD pt needing to be seen by [facility].  Pt states she is unable to come into the office today but will be able to make it tomorrow.  Appt scheduled and pt verbalized understanding of date and time.

## 2021-03-30 ENCOUNTER — Encounter: Payer: Self-pay | Admitting: *Deleted

## 2021-03-30 ENCOUNTER — Other Ambulatory Visit: Payer: Self-pay

## 2021-03-30 ENCOUNTER — Inpatient Hospital Stay (HOSPITAL_BASED_OUTPATIENT_CLINIC_OR_DEPARTMENT_OTHER): Payer: Medicaid Other | Admitting: Physician Assistant

## 2021-03-30 VITALS — BP 157/107 | HR 94 | Temp 99.4°F | Resp 17 | Wt 158.4 lb

## 2021-03-30 DIAGNOSIS — Z79899 Other long term (current) drug therapy: Secondary | ICD-10-CM | POA: Diagnosis not present

## 2021-03-30 DIAGNOSIS — C50412 Malignant neoplasm of upper-outer quadrant of left female breast: Secondary | ICD-10-CM | POA: Diagnosis not present

## 2021-03-30 DIAGNOSIS — M79605 Pain in left leg: Secondary | ICD-10-CM | POA: Diagnosis not present

## 2021-03-30 DIAGNOSIS — K0889 Other specified disorders of teeth and supporting structures: Secondary | ICD-10-CM

## 2021-03-30 DIAGNOSIS — M79604 Pain in right leg: Secondary | ICD-10-CM | POA: Diagnosis not present

## 2021-03-30 DIAGNOSIS — Z5189 Encounter for other specified aftercare: Secondary | ICD-10-CM | POA: Diagnosis not present

## 2021-03-30 DIAGNOSIS — Z720 Tobacco use: Secondary | ICD-10-CM | POA: Diagnosis not present

## 2021-03-30 DIAGNOSIS — Z5112 Encounter for antineoplastic immunotherapy: Secondary | ICD-10-CM | POA: Diagnosis not present

## 2021-03-30 DIAGNOSIS — Z23 Encounter for immunization: Secondary | ICD-10-CM | POA: Diagnosis not present

## 2021-03-30 DIAGNOSIS — Z5111 Encounter for antineoplastic chemotherapy: Secondary | ICD-10-CM | POA: Diagnosis not present

## 2021-03-30 DIAGNOSIS — Z803 Family history of malignant neoplasm of breast: Secondary | ICD-10-CM | POA: Diagnosis not present

## 2021-03-30 MED ORDER — GABAPENTIN 100 MG PO CAPS: 100.0000 mg | ORAL_CAPSULE | Freq: Two times a day (BID) | ORAL | 0 refills | Status: DC

## 2021-03-30 MED ORDER — LIDOCAINE VISCOUS HCL 2 % MT SOLN
15.0000 mL | OROMUCOSAL | 0 refills | Status: DC | PRN
Start: 2021-03-30 — End: 2022-03-01

## 2021-03-30 MED ORDER — CLINDAMYCIN HCL 150 MG PO CAPS
450.0000 mg | ORAL_CAPSULE | Freq: Three times a day (TID) | ORAL | 0 refills | Status: AC
Start: 2021-03-30 — End: 2021-04-06

## 2021-03-30 NOTE — Progress Notes (Signed)
RN placed call to [facility] dentistry [phone]).  Appt scheduled for Friday 04/08/21 at 11 am.  RN notified pt and verbalized understanding of date and time of appt.

## 2021-03-30 NOTE — Patient Instructions (Signed)
  Medications:  -I have prescribed you an antibiotic  clindamycin to treat the infection. It can cause diarrhea. If so take with a probiotic or yogurt as we discussed. -You can also gargle warm salt water up to 5 times daily. This will help to remove bacteria from the mouth.  You can also use viscous lidocaine every 3 hours to help numb the area.  You can apply a cool compress for 15 to 20 minutes, but avoid applying heat to the area. -Continue usual home medications.  -Gabapentin sent to the pharmacy to help with leg pain. If it is helpful you can ask your primary care doctor or oncologist for a refill.

## 2021-03-30 NOTE — Progress Notes (Signed)
Symptom Management Consult note [place]    Patient Care Team: [name], DO as PCP - General (Internal Medicine) Claudia Pollock, RN as Registered Nurse Dayna Barker, MD as Consulting Physician (General Surgery) Mauro Kaufmann, RN as Oncology Nurse Navigator Rockwell Germany, RN as Oncology Nurse Navigator    Name of the patient: Robin Horn  [id number]  09-Sep-1985   Date of visit: 03/30/2021    Chief complaint/ Reason for visit- dental pain, leg pain  Oncology History  Left breast lump (Resolved)  Malignant neoplasm of upper-outer quadrant of left breast in female, estrogen receptor positive ([place])  12/17/2020 Initial Diagnosis   Palpable lump in the upper outer left breast. 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, Her2+ (3+)/ER+ (80%)/PR- (<1%) in the left breast, and invasive ductal carcinoma, Her2+ (3+)/ER+ (80%)/PR+ (3%) in the left axillary lymph node   12/23/2020 Cancer Staging   Staging form: Breast, AJCC 8th Edition - Clinical stage from 12/23/2020: Stage IB (cT2, cN1, cM0, G3, ER+, PR+, HER2+) - Signed by [name], MD on 12/23/2020 Stage prefix: Initial diagnosis Histologic grading system: 3 grade system    01/11/2021 -  Chemotherapy   Patient is on Treatment Plan : BREAST  Docetaxel + Carboplatin + Trastuzumab + Pertuzumab  (TCHP) q21d      01/31/2021 Genetic Testing   Negative hereditary cancer genetic testing: no pathogenic variants detected in [facility] Common Hereditary Cancers +RNA Panel.  The report date is January 31, 2021.    The Common Hereditary Cancers + RNA Panel offered by [facility] includes sequencing, deletion/duplication, and RNA testing of the following 47 genes: APC, ATM, AXIN2, BARD1, BMPR1A, BRCA1, BRCA2, BRIP1,  CDH1, CDK4*, CDKN2A (p14ARF)*, CDKN2A (p16INK4a)*, CHEK2, CTNNA1, DICER1, EPCAM (Deletion/duplication testing only), GREM1 (promoter region deletion/duplication testing only), KIT, MEN1, MLH1, MSH2, MSH3, MSH6, MUTYH, NBN, NF1, NHTL1, PALB2, PDGFRA*, PMS2, POLD1, POLE, PTEN, RAD50, RAD51C, RAD51D, SDHB, SDHC, SDHD, SMAD4, SMARCA4. STK11, TP53, TSC1, TSC2, and VHL.  The following genes were evaluated for sequence changes only: SDHA and HOXB13 c.251G>A variant only.  RNA analysis is not performed for the * genes.       Current Therapy: carboplatin,docetaxel, pertuzumab cycle 4 03/16/21, pegfilgrastim-cbqv day 3 cycle 4 03/21/21  Interval history- Robin Horn is a 35 yo female with history as listed above presenting to [facility] with chief complaints of leg pain and dental pain.  Patient states she has had dental pain for over x1 month.  She saw dentist for this and was told she needed to have a tooth extracted however patient tells me because she is a cancer patient dentist referred her to Psychologist, sport and exercise.  Patient has an appointment scheduled with surgeon however it is not until March of next year.  Patient states she continues to have aching pain in right lower jaw.  Pain spreads to her ear.  Pain is constant and worse when eating.  Patient states she recently took antibiotic for dental infection however there is no improvement in pain.  She states for the last 2 days she noticed swelling on the right side of her face.  Swelling resolved after placing popsicles on her cheek.  She has been taking Tylenol and ibuprofen as well as her oxycodone with some pain relief.  She rates pain 8 out of 10 in  severity.  Patient also reports bilateral leg pain that is intermittent x1 week..  She describes it as a burning and tightness sensation.  Denies any history of injury, fall or trauma to the legs.  She denies any fever, chills, appetite changes, chest pain, shortness of breath, leg swelling, rash.     ROS  All other  systems are reviewed and are negative for acute change except as noted in the HPI.    Allergies  Allergen Reactions   Tramadol Itching   Hydrocodone-Acetaminophen Hives   Lactose Intolerance (Gi) Diarrhea     Past Medical History:  Diagnosis Date   Abnormal Pap smear of cervix    Anemia    Anxiety    Arthritis    Cancer (HCC)    Chlamydia    Depression    Family history of breast cancer 01/18/2021   GDM (gestational diabetes mellitus)    Gonorrhea    Herpes genitalia    History of hiatal hernia    Migraines    Ovarian cyst    Polycystic kidney disease    Preeclampsia    Pregnancy induced hypertension      Past Surgical History:  Procedure Laterality Date   COLPOSCOPY     DILATION AND CURETTAGE OF UTERUS     PORT A CATH REVISION Right 01/31/2021   Procedure: PORT A CATH REVISION;  Surgeon: Rolm Bookbinder, MD;  Location: [place];  Service: General;  Laterality: Right;   PORTACATH PLACEMENT N/A 01/06/2021   Procedure: INSERTION PORT-A-CATH;  Surgeon: Rolm Bookbinder, MD;  Location: [place];  Service: General;  Laterality: N/A;   TONSILLECTOMY     TUBAL LIGATION Bilateral 09/24/2016   Procedure: POST PARTUM TUBAL LIGATION;  Surgeon: Lavonia Drafts, MD;  Location: [place];  Service: Gynecology;  Laterality: Bilateral;    Social History   Socioeconomic History   Marital status: Single    Spouse name: Not on file   Number of children: Not on file   Years of education: Not on file   Highest education level: Not on file  Occupational History   Not on file  Tobacco Use   Smoking status: Light Smoker    Packs/day: 1.00    Types: Cigarettes   Smokeless tobacco: Never   Tobacco comments:    Wants to restart Nicotine  Vaping Use   Vaping Use: Never used  Substance and Sexual Activity   Alcohol use: Not Currently    Alcohol/week: 1.0 standard drink    Types: 1 Glasses of wine per week    Comment: Once every 2 months. Nothing  since finding out about pregnancy.    Drug use: No    Types: Cocaine    Comment: none since +preg   Sexual activity: Yes    Birth control/protection: None, Surgical    Comment: Tubal  Other Topics Concern   Not on file  Social History Narrative   Not on file   Social Determinants of Health   Financial Resource Strain: Not on file  Food Insecurity: Not on file  Transportation Needs: Not on file  Physical Activity: Not on file  Stress: Not on file  Social Connections: Not on file  Intimate Partner Violence: Not on file    Family History  Problem Relation Age of Onset   Kidney disease Mother    Breast cancer Maternal Aunt        dx 29s   Breast cancer Other  MGM's sister and MGM's niece; dx after 1   Other Neg Hx      Current Outpatient Medications:    clindamycin (CLEOCIN) 150 MG capsule, Take 3 capsules (450 mg total) by mouth 3 (three) times daily for 7 days., Disp: 63 capsule, Rfl: 0   gabapentin (NEURONTIN) 100 MG capsule, Take 1 capsule (100 mg total) by mouth 2 (two) times daily., Disp: 60 capsule, Rfl: 0   lidocaine (XYLOCAINE) 2 % solution, Use as directed 15 mLs in the mouth or throat every 3 (three) hours as needed for mouth pain., Disp: 100 mL, Rfl: 0   acetaminophen (TYLENOL) 325 MG tablet, Take 500 mg by mouth every 6 (six) hours as needed., Disp: , Rfl:    cetirizine (ZYRTEC) 10 MG tablet, Take 10 mg by mouth daily., Disp: , Rfl:    lidocaine-prilocaine (EMLA) cream, Apply to affected area once, Disp: 30 g, Rfl: 3   lisinopril (ZESTRIL) 10 MG tablet, Take 10 mg by mouth daily., Disp: , Rfl:    medroxyPROGESTERone Acetate 150 MG/ML SUSY, Inject into the muscle., Disp: , Rfl:    ondansetron (ZOFRAN) 8 MG tablet, Take 1 tablet (8 mg total) by mouth 2 (two) times daily as needed (Nausea or vomiting). Start on the third day after chemotherapy., Disp: 30 tablet, Rfl: 1   oxyCODONE (ROXICODONE) 15 MG immediate release tablet, Take 1 tablet (15 mg total) by  mouth every 8 (eight) hours as needed for pain., Disp: 90 tablet, Rfl: 0   prochlorperazine (COMPAZINE) 10 MG tablet, Take 1 tablet (10 mg total) by mouth every 6 (six) hours as needed (Nausea or vomiting)., Disp: 30 tablet, Rfl: 1   QUEtiapine (SEROQUEL) 100 MG tablet, Take 1 tablet (100 mg total) by mouth at bedtime., Disp: 30 tablet, Rfl: 3   tiZANidine (ZANAFLEX) 4 MG tablet, Take 4 mg by mouth 3 (three) times daily., Disp: , Rfl:    valACYclovir (VALTREX) 1000 MG tablet, Take 1,000 mg by mouth daily., Disp: , Rfl:    zolpidem (AMBIEN) 5 MG tablet, Take 1 tablet (5 mg total) by mouth at bedtime as needed., Disp: 30 tablet, Rfl: 3  PHYSICAL EXAM: ECOG FS:1 - Symptomatic but completely ambulatory    Vitals:   03/30/21 1353  BP: (!) 157/107  Pulse: 94  Resp: 17  Temp: 99.4 F (37.4 C)  TempSrc: Oral  SpO2: 100%  Weight: 158 lb 7 oz (71.9 kg)   Physical Exam Vitals and nursing note reviewed.  Constitutional:      General: She is not in acute distress.    Appearance: She is not toxic-appearing.  HENT:     Head: Normocephalic and atraumatic.     Nose: Nose normal.     Mouth/Throat:      Comments: Chronic dental decay. No noted area of swelling or fluctuance.  No trismus.  Mouth opening to at least 3 finger widths.  Handles oral secretions without difficulty.  External exam shows no asymmetry of the jaw line or face, no signs of obvious swelling or infection.  No swelling or tenderness to the submental or submandibular regions.  No swelling or tenderness into the soft tissues of the neck. Full active range of motion of the jaw. Neck is supple with full active range of motion, no tenderness to palpation of the soft tissues.  Eyes:     General: No scleral icterus.       Right eye: No discharge.        Left eye:  No discharge.     Conjunctiva/sclera: Conjunctivae normal.  Cardiovascular:     Rate and Rhythm: Normal rate and regular rhythm.     Pulses: Normal pulses.           Dorsalis pedis pulses are 2+ on the right side and 2+ on the left side.     Heart sounds: Normal heart sounds.  Pulmonary:     Effort: Pulmonary effort is normal.     Breath sounds: Normal breath sounds.  Abdominal:     General: There is no distension.  Musculoskeletal:        General: Normal range of motion.     Cervical back: Normal range of motion. No muscular tenderness.     Right lower leg: No edema.     Left lower leg: No edema.     Comments: Full range of motion of bilateral lower extremities.  Compartments are soft.  No overlying skin changes.  Negative Homans' sign bilaterally.  No palpable cords.  No swelling appreciated.  Feet:     Right foot:     Skin integrity: Skin integrity normal.     Left foot:     Skin integrity: Skin integrity normal.  Skin:    General: Skin is warm and dry.     Capillary Refill: Capillary refill takes less than 2 seconds.     Comments: Equal tactile temperature in all extremities  Neurological:     Mental Status: She is oriented to person, place, and time.     Comments: Sensation grossly intact to light touch in the lower extremities bilaterally. No saddle anesthesias. Strength 5/5 with flexion and extension at the bilateral hips, knees, and ankles. No noted gait deficit. Coordination intact with heel to shin testing.    Psychiatric:        Behavior: Behavior normal.       LABORATORY DATA: I have reviewed the data as listed CBC Latest Ref Rng & Units 03/16/2021 02/22/2021 02/01/2021  WBC 4.0 - 10.5 K/uL 6.1 8.0 10.8(H)  Hemoglobin 12.0 - 15.0 g/dL 9.0(L) 9.4(L) 9.9(L)  Hematocrit 36.0 - 46.0 % 28.1(L) 28.7(L) 29.8(L)  Platelets 150 - 400 K/uL 206 213 257     CMP Latest Ref Rng & Units 03/16/2021 02/22/2021 02/01/2021  Glucose 70 - 99 mg/dL 129(H) 103(H) 171(H)  BUN 6 - 20 mg/dL 10 14 26(H)  Creatinine 0.44 - 1.00 mg/dL 1.19(H) 1.23(H) 1.44(H)  Sodium 135 - 145 mmol/L 139 142 140  Potassium 3.5 - 5.1 mmol/L 3.2(L) 3.6 4.1  Chloride  98 - 111 mmol/L 105 105 108  CO2 22 - 32 mmol/L 24 25 22   Calcium 8.9 - 10.3 mg/dL 8.9 9.5 9.2  Total Protein 6.5 - 8.1 g/dL 6.6 7.2 6.8  Total Bilirubin 0.3 - 1.2 mg/dL <0.2(L) 0.3 <0.2(L)  Alkaline Phos 38 - 126 U/L 73 73 66  AST 15 - 41 U/L 9(L) 11(L) 14(L)  ALT 0 - 44 U/L 9 12 14        RADIOGRAPHIC STUDIES: I have personally reviewed the radiological images as listed and agreed with the findings in the report. No images are attached to the encounter. No results found.   ASSESSMENT & PLAN: Patient is a 35 y.o. female with history of malignant neoplasm of upper-outer quadrant of left breast in female, estrogen receptor positive on chemotherapy Docetaxol, carboplatin and trastuzumab followed by oncologist Dr. Lindi Adie.  #) Dental pain-  Patient afebrile, nontoxic-appearing.  Exam shows fractured tooth, no abscess appreciated.  Exam  not suggestive of [place]'s angina or spread of infection.  Given patient complained of recent facial swelling will treat with course of clindamycin.  Will recommend continuing Tylenol and ibuprofen for pain.  We will also prescribe viscous lidocaine to help with pain as well and encourage salt water rinses.  Patient knows definitive treatment includes tooth extraction and she will continue to seek dental care to see if she can get a sooner appointment.  #) Leg pain-benign exam of extremities.  Neurovascular intact distally.  Patient ambulatory with normal gait.  Symptoms suggestive of neuropathy, unlikely to be bilateral DVT or fractures.  Negative Homans' sign.  Will start on low-dose gabapentin to see if that helps with pain.  Patient agreeable with this and will follow-up with oncologist if pain continues.  ED precautions discussed.     Visit Diagnosis: 1. Pain, dental   2. Encounter for immunization   3. Pain in both lower extremities      No orders of the defined types were placed in this encounter.   All questions were answered. The patient knows  to call the clinic with any problems, questions or concerns. No barriers to learning was detected.  I have spent a total of 20 minutes minutes of face-to-face and non-face-to-face time, preparing to see the patient, obtaining and/or reviewing separately obtained history, performing a medically appropriate examination, counseling and educating the patient, ordering tests,  documenting clinical information in the electronic health record, and care coordination.     Thank you for allowing me to participate in the care of this patient.    Barrie Folk, PA-C Department of Hematology/Oncology [facility] at [facility] Phone: [phone]  [phone]    03/30/2021 5:20 PM

## 2021-03-31 DIAGNOSIS — Z419 Encounter for procedure for purposes other than remedying health state, unspecified: Secondary | ICD-10-CM | POA: Diagnosis not present

## 2021-04-01 ENCOUNTER — Other Ambulatory Visit: Payer: Self-pay | Admitting: Hematology and Oncology

## 2021-04-01 MED ORDER — OXYCODONE HCL 15 MG PO TABS: 15.0000 mg | ORAL_TABLET | Freq: Three times a day (TID) | ORAL | 0 refills | Status: DC | PRN

## 2021-04-04 MED FILL — Fosaprepitant Dimeglumine For IV Infusion 150 MG (Base Eq): INTRAVENOUS | Qty: 5 | Status: AC

## 2021-04-04 MED FILL — Dexamethasone Sodium Phosphate Inj 100 MG/10ML: INTRAMUSCULAR | Qty: 1 | Status: AC

## 2021-04-05 ENCOUNTER — Inpatient Hospital Stay: Payer: Medicaid Other | Attending: Hematology and Oncology

## 2021-04-05 ENCOUNTER — Inpatient Hospital Stay: Payer: Medicaid Other

## 2021-04-05 ENCOUNTER — Other Ambulatory Visit: Payer: Self-pay | Admitting: *Deleted

## 2021-04-05 ENCOUNTER — Other Ambulatory Visit: Payer: Self-pay

## 2021-04-05 ENCOUNTER — Encounter: Payer: Self-pay | Admitting: *Deleted

## 2021-04-05 VITALS — BP 138/95 | HR 86 | Temp 99.6°F | Resp 18

## 2021-04-05 DIAGNOSIS — Z95828 Presence of other vascular implants and grafts: Secondary | ICD-10-CM

## 2021-04-05 DIAGNOSIS — Z170 Estrogen receptor positive status [ER+]: Secondary | ICD-10-CM

## 2021-04-05 DIAGNOSIS — D6481 Anemia due to antineoplastic chemotherapy: Secondary | ICD-10-CM | POA: Insufficient documentation

## 2021-04-05 DIAGNOSIS — Z5189 Encounter for other specified aftercare: Secondary | ICD-10-CM | POA: Diagnosis not present

## 2021-04-05 DIAGNOSIS — Z5111 Encounter for antineoplastic chemotherapy: Secondary | ICD-10-CM | POA: Insufficient documentation

## 2021-04-05 DIAGNOSIS — C50412 Malignant neoplasm of upper-outer quadrant of left female breast: Secondary | ICD-10-CM | POA: Diagnosis not present

## 2021-04-05 DIAGNOSIS — T451X5A Adverse effect of antineoplastic and immunosuppressive drugs, initial encounter: Secondary | ICD-10-CM | POA: Diagnosis not present

## 2021-04-05 DIAGNOSIS — Z79899 Other long term (current) drug therapy: Secondary | ICD-10-CM | POA: Diagnosis not present

## 2021-04-05 DIAGNOSIS — Z5112 Encounter for antineoplastic immunotherapy: Secondary | ICD-10-CM | POA: Diagnosis not present

## 2021-04-05 LAB — CMP (CANCER CENTER ONLY)
ALT: 6 U/L (ref 0–44)
AST: 10 U/L — ABNORMAL LOW (ref 15–41)
Albumin: 3.9 g/dL (ref 3.5–5.0)
Alkaline Phosphatase: 75 U/L (ref 38–126)
Anion gap: 10 (ref 5–15)
BUN: 12 mg/dL (ref 6–20)
CO2: 24 mmol/L (ref 22–32)
Calcium: 9.1 mg/dL (ref 8.9–10.3)
Chloride: 105 mmol/L (ref 98–111)
Creatinine: 1.14 mg/dL — ABNORMAL HIGH (ref 0.44–1.00)
GFR, Estimated: >60 mL/min (ref >60)
Glucose, Bld: 99 mg/dL (ref 70–99)
Potassium: 3.8 mmol/L (ref 3.5–5.1)
Sodium: 139 mmol/L (ref 135–145)
Total Bilirubin: 0.3 mg/dL (ref 0.3–1.2)
Total Protein: 6.7 g/dL (ref 6.5–8.1)

## 2021-04-05 LAB — CBC WITH DIFFERENTIAL (CANCER CENTER ONLY)
Abs Immature Granulocytes: 0.01 10*3/uL (ref 0.00–0.07)
Basophils Absolute: 0 10*3/uL (ref 0.0–0.1)
Basophils Relative: 0 %
Eosinophils Absolute: 0 10*3/uL (ref 0.0–0.5)
Eosinophils Relative: 0 %
HCT: 27.8 % — ABNORMAL LOW (ref 36.0–46.0)
Hemoglobin: 8.7 g/dL — ABNORMAL LOW (ref 12.0–15.0)
Immature Granulocytes: 0 %
Lymphocytes Relative: 42 %
Lymphs Abs: 1.3 10*3/uL (ref 0.7–4.0)
MCH: 28.7 pg (ref 26.0–34.0)
MCHC: 31.3 g/dL (ref 30.0–36.0)
MCV: 91.7 fL (ref 80.0–100.0)
Monocytes Absolute: 0.3 10*3/uL (ref 0.1–1.0)
Monocytes Relative: 9 %
Neutro Abs: 1.6 10*3/uL — ABNORMAL LOW (ref 1.7–7.7)
Neutrophils Relative %: 49 %
Platelet Count: 126 10*3/uL — ABNORMAL LOW (ref 150–400)
RBC: 3.03 MIL/uL — ABNORMAL LOW (ref 3.87–5.11)
RDW: 18.3 % — ABNORMAL HIGH (ref 11.5–15.5)
WBC Count: 3.2 10*3/uL — ABNORMAL LOW (ref 4.0–10.5)
nRBC: 0.6 % — ABNORMAL HIGH (ref 0.0–0.2)

## 2021-04-05 MED ORDER — SODIUM CHLORIDE 0.9 % IV SOLN
523.5000 mg | Freq: Once | INTRAVENOUS | Status: AC
  Administered 2021-04-05: 520 mg via INTRAVENOUS
  Filled 2021-04-05: qty 52

## 2021-04-05 MED ORDER — SODIUM CHLORIDE 0.9 % IV SOLN
150.0000 mg | Freq: Once | INTRAVENOUS | Status: AC
  Administered 2021-04-05: 150 mg via INTRAVENOUS
  Filled 2021-04-05: qty 150

## 2021-04-05 MED ORDER — HEPARIN SOD (PORK) LOCK FLUSH 100 UNIT/ML IV SOLN
500.0000 [IU] | Freq: Once | INTRAVENOUS | Status: AC | PRN
  Administered 2021-04-05: 500 [IU]

## 2021-04-05 MED ORDER — SODIUM CHLORIDE 0.9 % IV SOLN
Freq: Once | INTRAVENOUS | Status: AC
Start: 2021-04-05 — End: 2021-04-05

## 2021-04-05 MED ORDER — PALONOSETRON HCL INJECTION 0.25 MG/5ML
0.2500 mg | Freq: Once | INTRAVENOUS | Status: AC
  Administered 2021-04-05: 0.25 mg via INTRAVENOUS
  Filled 2021-04-05: qty 5

## 2021-04-05 MED ORDER — ACETAMINOPHEN 325 MG PO TABS
650.0000 mg | ORAL_TABLET | Freq: Once | ORAL | Status: AC
  Administered 2021-04-05: 650 mg via ORAL
  Filled 2021-04-05: qty 2

## 2021-04-05 MED ORDER — ONDANSETRON HCL 8 MG PO TABS: 8.0000 mg | ORAL_TABLET | Freq: Two times a day (BID) | ORAL | 1 refills | Status: DC | PRN

## 2021-04-05 MED ORDER — SODIUM CHLORIDE 0.9 % IV SOLN
420.0000 mg | Freq: Once | INTRAVENOUS | Status: AC
  Administered 2021-04-05: 420 mg via INTRAVENOUS
  Filled 2021-04-05: qty 14

## 2021-04-05 MED ORDER — SODIUM CHLORIDE 0.9 % IV SOLN
65.0000 mg/m2 | Freq: Once | INTRAVENOUS | Status: AC
  Administered 2021-04-05: 110 mg via INTRAVENOUS
  Filled 2021-04-05: qty 11

## 2021-04-05 MED ORDER — DIPHENHYDRAMINE HCL 25 MG PO CAPS
25.0000 mg | ORAL_CAPSULE | Freq: Once | ORAL | Status: AC
  Administered 2021-04-05: 25 mg via ORAL
  Filled 2021-04-05: qty 1

## 2021-04-05 MED ORDER — TRASTUZUMAB-DKST CHEMO 150 MG IV SOLR
6.0000 mg/kg | Freq: Once | INTRAVENOUS | Status: AC
  Administered 2021-04-05: 441 mg via INTRAVENOUS
  Filled 2021-04-05: qty 21

## 2021-04-05 MED ORDER — PROCHLORPERAZINE MALEATE 10 MG PO TABS: 10.0000 mg | ORAL_TABLET | Freq: Four times a day (QID) | ORAL | 1 refills | Status: DC | PRN

## 2021-04-05 MED ORDER — SODIUM CHLORIDE 0.9 % IV SOLN
10.0000 mg | Freq: Once | INTRAVENOUS | Status: AC
  Administered 2021-04-05: 10 mg via INTRAVENOUS
  Filled 2021-04-05: qty 10

## 2021-04-05 MED ORDER — SODIUM CHLORIDE 0.9% FLUSH
10.0000 mL | INTRAVENOUS | Status: DC | PRN
  Administered 2021-04-05: 10 mL

## 2021-04-05 MED ORDER — SODIUM CHLORIDE 0.9% FLUSH
10.0000 mL | Freq: Once | INTRAVENOUS | Status: AC
  Administered 2021-04-05: 10 mL

## 2021-04-05 NOTE — Patient Instructions (Signed)
[facility] MEDICAL ONCOLOGY  Discharge Instructions: Thank you for choosing [facility] to provide your oncology and hematology care.   If you have a lab appointment with the Cancer Center, please go directly to the Cancer Center and check in at the registration area.   Wear comfortable clothing and clothing appropriate for easy access to any Portacath or PICC line.   We strive to give you quality time with your provider. You may need to reschedule your appointment if you arrive late (15 or more minutes).  Arriving late affects you and other patients whose appointments are after yours.  Also, if you miss three or more appointments without notifying the office, you may be dismissed from the clinic at the provider's discretion.      For prescription refill requests, have your pharmacy contact our office and allow 72 hours for refills to be completed.    Today you received the following chemotherapy and/or immunotherapy agents Trastuzumab, pertuzumab, docetaxel, carboplatin   To help prevent nausea and vomiting after your treatment, we encourage you to take your nausea medication as directed.  BELOW ARE SYMPTOMS THAT SHOULD BE REPORTED IMMEDIATELY: *FEVER GREATER THAN 100.4 F (38 C) OR HIGHER *CHILLS OR SWEATING *NAUSEA AND VOMITING THAT IS NOT CONTROLLED WITH YOUR NAUSEA MEDICATION *UNUSUAL SHORTNESS OF BREATH *UNUSUAL BRUISING OR BLEEDING *URINARY PROBLEMS (pain or burning when urinating, or frequent urination) *BOWEL PROBLEMS (unusual diarrhea, constipation, pain near the anus) TENDERNESS IN MOUTH AND THROAT WITH OR WITHOUT PRESENCE OF ULCERS (sore throat, sores in mouth, or a toothache) UNUSUAL RASH, SWELLING OR PAIN  UNUSUAL VAGINAL DISCHARGE OR ITCHING   Items with * indicate a potential emergency and should be followed up as soon as possible or go to the Emergency Department if any problems should occur.  Please show the CHEMOTHERAPY ALERT CARD or  IMMUNOTHERAPY ALERT CARD at check-in to the Emergency Department and triage nurse.  Should you have questions after your visit or need to cancel or reschedule your appointment, please contact [facility] MEDICAL ONCOLOG[facility]  Dept: [phone]  and follow the prompts.  Office hours are 8:00 a.m. to 4:30 p.m. Monday - Friday. Please note that voicemails left after 4:00 p.m. may not be returned until the following business day.  We are closed weekends and major holidays. You have access to a nurse at all times for urgent questions. Please call the main number to the clinic Dept: [phone] and follow the prompts.   For any non-urgent questions, you may also contact your provider using MyChart. We now offer e-Visits for anyone 18 and older to request care online for non-urgent symptoms. For details visit [email or web address].   Also download the MyChart app! Go to the app store, search "MyChart", open the app, select [place], and log in with your MyChart username and password.  Due to Covid, a mask is required upon entering the hospital/clinic. If you do not have a mask, one will be given to you upon arrival. For doctor visits, patients may have 1 support person aged 18 or older with them. For treatment visits, patients cannot have anyone with them due to current Covid guidelines and our immunocompromised population.  

## 2021-04-06 ENCOUNTER — Telehealth: Payer: Self-pay | Admitting: *Deleted

## 2021-04-06 NOTE — Telephone Encounter (Signed)
Spoke to pt, provided appt with Dr. Donne Hazel on 1/3 at 3:30pm. Confirmed appts. Informed pt will finalize surgical plan at this appt No further needs or concerns voiced. Encourage pt to call with needs.

## 2021-04-07 ENCOUNTER — Inpatient Hospital Stay: Payer: Medicaid Other

## 2021-04-08 ENCOUNTER — Encounter: Payer: Self-pay | Admitting: Hematology and Oncology

## 2021-04-08 ENCOUNTER — Inpatient Hospital Stay: Payer: Medicaid Other

## 2021-04-08 ENCOUNTER — Ambulatory Visit (INDEPENDENT_AMBULATORY_CARE_PROVIDER_SITE_OTHER): Payer: Medicaid Other | Admitting: Dentistry

## 2021-04-08 ENCOUNTER — Encounter (HOSPITAL_COMMUNITY): Payer: Self-pay | Admitting: Dentistry

## 2021-04-08 ENCOUNTER — Other Ambulatory Visit: Payer: Self-pay

## 2021-04-08 VITALS — BP 125/69 | HR 72 | Temp 98.5°F

## 2021-04-08 DIAGNOSIS — K045 Chronic apical periodontitis: Secondary | ICD-10-CM

## 2021-04-08 DIAGNOSIS — F40232 Fear of other medical care: Secondary | ICD-10-CM

## 2021-04-08 DIAGNOSIS — K0402 Irreversible pulpitis: Secondary | ICD-10-CM

## 2021-04-08 DIAGNOSIS — K036 Deposits [accretions] on teeth: Secondary | ICD-10-CM

## 2021-04-08 DIAGNOSIS — Z0120 Encounter for dental examination and cleaning without abnormal findings: Secondary | ICD-10-CM

## 2021-04-08 DIAGNOSIS — T451X5A Adverse effect of antineoplastic and immunosuppressive drugs, initial encounter: Secondary | ICD-10-CM | POA: Diagnosis not present

## 2021-04-08 DIAGNOSIS — Z5189 Encounter for other specified aftercare: Secondary | ICD-10-CM | POA: Diagnosis not present

## 2021-04-08 DIAGNOSIS — D6481 Anemia due to antineoplastic chemotherapy: Secondary | ICD-10-CM | POA: Diagnosis not present

## 2021-04-08 DIAGNOSIS — Z5112 Encounter for antineoplastic immunotherapy: Secondary | ICD-10-CM | POA: Diagnosis not present

## 2021-04-08 DIAGNOSIS — K029 Dental caries, unspecified: Secondary | ICD-10-CM

## 2021-04-08 DIAGNOSIS — K08109 Complete loss of teeth, unspecified cause, unspecified class: Secondary | ICD-10-CM

## 2021-04-08 DIAGNOSIS — C50412 Malignant neoplasm of upper-outer quadrant of left female breast: Secondary | ICD-10-CM | POA: Diagnosis not present

## 2021-04-08 DIAGNOSIS — Z5111 Encounter for antineoplastic chemotherapy: Secondary | ICD-10-CM | POA: Diagnosis not present

## 2021-04-08 DIAGNOSIS — Z79899 Other long term (current) drug therapy: Secondary | ICD-10-CM | POA: Diagnosis not present

## 2021-04-08 MED ORDER — PEGFILGRASTIM-CBQV 6 MG/0.6ML ~~LOC~~ SOSY
6.0000 mg | PREFILLED_SYRINGE | Freq: Once | SUBCUTANEOUS | Status: AC
  Administered 2021-04-08: 6 mg via SUBCUTANEOUS
  Filled 2021-04-08: qty 0.6

## 2021-04-08 NOTE — Progress Notes (Signed)
Department of Dental Medicine                LIMITED EXAM   Service Date:   04/08/2021  Patient Name:   Robin Horn Date of Birth:   [birth year] Medical Record Number: [id number]  Referring Provider:           [name], MD   PLAN/RECOMMENDATIONS   Assessment There are no current signs of acute odontogenic infection including abscess, edema or erythema, or suspicious lesion requiring biopsy.   #21 & #31 severe decay (hopeless prognosis); #2 deep decay approximating the pulp.  Recommendations Extractions of teeth #'s 2, 21 & 31 to decrease the risk of  acute or systemic infection and/or worsening pain.   Establish dental care at an outside office of the patient's choice for routine care including cleanings/periodontal therapy, restorative and any other indicated dental treatment.  Plan Refer to [facility] for extractions per patient's decision. Call should any questions or concerns arise.  Discussed in detail all treatment options and recommendations with the patient and they are agreeable to the plan.    Thank you for consulting with [facility] and for the opportunity to participate in this patient's treatment.  Should you have any questions or concerns, please contact the [facility] at [phone].   04/08/2021 Consult Note:  COVID-19 SCREENING:  The patient denies symptoms concerning for COVID-19 infection including fever, chills, cough, or newly developed shortness of breath.   HISTORY OF PRESENT ILLNESS: Robin Horn is a pleasant 35 y.o. female with h/o anxiety disorder, depression, anemia, arthritis and tobacco use who was recently diagnosed with breast cancer and is currently undergoing chemotherapy.  The patient presents today for a limited dental exam to evaluate persistent tooth pain.   DENTAL HISTORY: Per Epic Progress Note by Sherol Dade, PA on 03/30/2021: "Patient states she has had dental pain for over x1  month.  She saw dentist for this and was told she needed to have a tooth extracted however patient tells me because she is a cancer patient dentist referred her to Psychologist, sport and exercise.  Patient has an appointment scheduled with surgeon however it is not until March of next year.  Patient states she continues to have aching pain in right lower jaw.  Pain spreads to her ear.  Pain is constant and worse when eating.  Patient states she recently took antibiotic for dental infection however there is no improvement in pain.  She states for the last 2 days she noticed swelling on the right side of her face.  Swelling resolved after placing popsicles on her cheek.  She has been taking Tylenol and ibuprofen as well as her oxycodone with some pain relief.  She rates pain 8 out of 10 in severity.  Patient also reports bilateral leg pain that is intermittent x1 week..  She describes it as a burning and tightness sensation.  Denies any history of injury, fall or trauma to the legs.  She denies any fever, chills, appetite changes, chest pain, shortness of breath, leg swelling, rash." The patient reports that she did go to 1 dental office for her tooth pain, however she has not returned due to financial constraints regarding the treatment plan developed by the dentist and her current medical treatment and conditions.  She states that she has been trying to find another local office that accepts her insurance with no success.  She says that she has a tooth on the lower right that  is cracked/broken and has been hurting on and off.  She says the pain is severe when it happens and is when she chews on it or gets food stuck in it.  She says that she also has several other teeth with cavities which are starting to bother her. Patient is able to manage oral secretions.  Patient denies dysphagia, odynophagia, dysphonia, SOB and neck pain.  Patient denies fever, rigors and malaise.   CHIEF COMPLAINT:  "Bottom right tooth pain," points to tooth  #31.   Patient Active Problem List   Diagnosis Date Noted   Genetic testing 02/03/2021   Family history of breast cancer 01/18/2021   Port-A-Cath in place 01/18/2021   Malignant neoplasm of upper-outer quadrant of left breast in female, estrogen receptor positive ([place]) 12/23/2020   Urinary problem 11/15/2020   Hypertension 11/15/2020   Encounter for female sterilization procedure 09/24/2016   Positive GBS test 09/23/2016   Supervision of other high risk pregnancy, antepartum 09/20/2016   Pregnancy affected by fetal growth restriction 09/20/2016   GDM (gestational diabetes mellitus) 09/11/2016   Gestational hypertension without significant proteinuria 08/29/2016   History of postpartum hemorrhage, currently pregnant 05/25/2016   Drug use affecting pregnancy, antepartum 05/25/2016   Bipolar 1 disorder ([place]) 05/25/2016   Insomnia 05/25/2016   Monochorionic diamniotic twin gestation in third trimester 04/25/2016   Past Medical History:  Diagnosis Date   Abnormal Pap smear of cervix    Anemia    Anxiety    Arthritis    Cancer ([place])    Chlamydia    Depression    Family history of breast cancer 01/18/2021   GDM (gestational diabetes mellitus)    Gonorrhea    Herpes genitalia    History of hiatal hernia    Migraines    Ovarian cyst    Polycystic kidney disease    Preeclampsia    Pregnancy induced hypertension    Past Surgical History:  Procedure Laterality Date   COLPOSCOPY     DILATION AND CURETTAGE OF UTERUS     PORT A CATH REVISION Right 01/31/2021   Procedure: PORT A CATH REVISION;  Surgeon: Rolm Bookbinder, MD;  Location: [facility];  Service: General;  Laterality: Right;   PORTACATH PLACEMENT N/A 01/06/2021   Procedure: INSERTION PORT-A-CATH;  Surgeon: Rolm Bookbinder, MD;  Location: [facility];  Service: General;  Laterality: N/A;   TONSILLECTOMY     TUBAL LIGATION Bilateral 09/24/2016   Procedure: POST PARTUM TUBAL LIGATION;  Surgeon: Lavonia Drafts, MD;  Location: [facility];  Service: Gynecology;  Laterality: Bilateral;   Allergies  Allergen Reactions   Tramadol Itching   Hydrocodone-Acetaminophen Hives   Lactose Intolerance (Gi) Diarrhea   Current Outpatient Medications  Medication Sig Dispense Refill   acetaminophen (TYLENOL) 325 MG tablet Take 500 mg by mouth every 6 (six) hours as needed.     cetirizine (ZYRTEC) 10 MG tablet Take 10 mg by mouth daily.     gabapentin (NEURONTIN) 100 MG capsule Take 1 capsule (100 mg total) by mouth 2 (two) times daily. 60 capsule 0   lidocaine (XYLOCAINE) 2 % solution Use as directed 15 mLs in the mouth or throat every 3 (three) hours as needed for mouth pain. 100 mL 0   lidocaine-prilocaine (EMLA) cream Apply to affected area once 30 g 3   lisinopril (ZESTRIL) 10 MG tablet Take 10 mg by mouth daily.     medroxyPROGESTERone Acetate 150 MG/ML SUSY Inject into the muscle.  ondansetron (ZOFRAN) 8 MG tablet Take 1 tablet (8 mg total) by mouth 2 (two) times daily as needed (Nausea or vomiting). Start on the third day after chemotherapy. 30 tablet 1   oxyCODONE (ROXICODONE) 15 MG immediate release tablet Take 1 tablet (15 mg total) by mouth every 8 (eight) hours as needed for pain. 90 tablet 0   prochlorperazine (COMPAZINE) 10 MG tablet Take 1 tablet (10 mg total) by mouth every 6 (six) hours as needed (Nausea or vomiting). 30 tablet 1   QUEtiapine (SEROQUEL) 100 MG tablet Take 1 tablet (100 mg total) by mouth at bedtime. 30 tablet 3   tiZANidine (ZANAFLEX) 4 MG tablet Take 4 mg by mouth 3 (three) times daily.     valACYclovir (VALTREX) 1000 MG tablet Take 1,000 mg by mouth daily.     zolpidem (AMBIEN) 5 MG tablet Take 1 tablet (5 mg total) by mouth at bedtime as needed. 30 tablet 3   No current facility-administered medications for this visit.    LABS: Lab Results  Component Value Date   WBC 3.2 (L) 04/05/2021   HGB 8.7 (L) 04/05/2021   HCT 27.8 (L) 04/05/2021   MCV 91.7  04/05/2021   PLT 126 (L) 04/05/2021      Component Value Date/Time   NA 139 04/05/2021 0845   K 3.8 04/05/2021 0845   CL 105 04/05/2021 0845   CO2 24 04/05/2021 0845   GLUCOSE 99 04/05/2021 0845   BUN 12 04/05/2021 0845   CREATININE 1.14 (H) 04/05/2021 0845   CALCIUM 9.1 04/05/2021 0845   GFRNONAA >60 04/05/2021 0845   GFRAA >60 09/18/2018 2340   No results found for: INR, PROTIME No results found for: PTT  Social History   Socioeconomic History   Marital status: Single    Spouse name: Not on file   Number of children: Not on file   Years of education: Not on file   Highest education level: Not on file  Occupational History   Not on file  Tobacco Use   Smoking status: Light Smoker    Packs/day: 1.00    Types: Cigarettes   Smokeless tobacco: Never   Tobacco comments:    Wants to restart Nicotine  Vaping Use   Vaping Use: Never used  Substance and Sexual Activity   Alcohol use: Not Currently    Alcohol/week: 1.0 standard drink    Types: 1 Glasses of wine per week    Comment: Once every 2 months. Nothing since finding out about pregnancy.    Drug use: No    Types: Cocaine    Comment: none since +preg   Sexual activity: Yes    Birth control/protection: None, Surgical    Comment: Tubal  Other Topics Concern   Not on file  Social History Narrative   Not on file   Social Determinants of Health   Financial Resource Strain: Not on file  Food Insecurity: Not on file  Transportation Needs: Not on file  Physical Activity: Not on file  Stress: Not on file  Social Connections: Not on file  Intimate Partner Violence: Not on file   Family History  Problem Relation Age of Onset   Kidney disease Mother    Breast cancer Maternal Aunt        dx 58s   Breast cancer Other        MGM's sister and MGM's niece; dx after 48   Other Neg Hx      REVIEW OF SYSTEMS:  Reviewed  with the patient as per HPI. Psych:  (+) Dental phobia   VITAL SIGNS: BP 125/69 (BP  Location: Right Arm, Patient Position: Sitting, Cuff Size: Normal)   Pulse 72   Temp 98.5 F (36.9 C) (Oral)    PHYSICAL EXAM: General:  Well-developed, comfortable and in no apparent distress. Neurological:  Alert and oriented to person, place and  time. Extraoral:  Facial symmetry present without any edema or erythema.  No swelling or lymphadenopathy.  TMJ asymptomatic without clicks or crepitations.  Intraoral:  Soft tissues appear well-perfused and mucous membranes moist.  FOM and vestibules soft and not raised. Oral cavity without mass or lesion. No signs of infection, parulis, sinus tract, edema or erythema evident upon exam.   DENTAL EXAM: LIMITED hard tissue exam completed and charted.    Overall impression:  Fair remaining dentition.    Oral hygiene:  Poor  Periodontal:  Pink, healthy gingival tissue with blunted papilla with areas of inflamed and erythematous gingival tissue.  Generalized plaque and calculus accumulation. Caries:  #2, #4, #15, #18, #19, #20, #21, #28, #29, #30, #31  Endodontics:     Vitality testing/other testing:  Tested teeth numbers 19, 20 & 21 for air/water, palpation, percussion, mobility and caries.  #21 (++) to percussion and caries (extensive buccal caries compromising remaining coronal tooth structure). Occlusion:  Class 1 molar occlusion.   RADIOGRAPHIC EXAM:  PAN exposed and interpreted.    Condyles seated bilaterally in fossas.  No evidence of abnormal pathology.  All visualized osseous structures appear WNL.  Caries- #2, #4, #15, #18, #19, #20, #21, #28, #29, #30 & #31: #2, #20 & #28 deep decay; #21 & #31 deep decay approximating the pulp.   ASSESSMENT:  1.  Breast cancer 2.  On active chemotherapy 3.  Limited dental examination 4.  Caries 5.  Missing teeth (#1, #16, #17, #32) 6.  Accretions on teeth 7.  Acute apical periodontitis 8.  Symptomatic irreversible pulpitis 9.  Dental phobia   PLAN AND RECOMMENDATIONS: I discussed the  risks, benefits, and complications of various scenarios with the patient in relationship to their medical and dental conditions.  I explained that if any chronic or acute dental/oral infection(s) are addressed and subsequently not maintained following medical optimization and recovery, their risk of the previously mentioned complications are just as high.  I explained all significant findings of the dental consultation with the patient including severe decay affecting teeth numbers 21 and 31 as well as multiple other carious teeth which were seen clinically, and the recommended care including extractions of teeth numbers 21 and 31 to address her chief complaint of ongoing pain/flare-ups of infection.  I also discussed with the patient extracting tooth #2 also because there is large decay and the tooth will have no opposing dentition after taking out #31.  The patient verbalized understanding of all findings, discussion, and recommendations. We then discussed various treatment options to include no treatment, multiple extractions with alveoloplasty in the hospital under general anesthesia vs being referred out to a local oral surgeon for treatment.  The patient verbalized understanding of all options, and currently wishes to proceed with referral to oral surgeon for IV sedation or general anesthesia as indicated due to her dental phobia and the possibility of having teeth extracted sooner. Plan to refer to [facility] for indicated extractions.  The patient will be contacted by the office to schedule an appointment.  This was explained to her and she verbalized understanding and is agreeable  to the plan. Instructed the patient to call should any questions or concerns arise.   The patient will need to establish care at a dental office of her choice for routine dental care including replacement of missing teeth as needed, cleanings and exams.  All questions and concerns were invited and addressed.  The  patient tolerated today's visit well and departed in stable condition.  I spent in excess of 60 minutes during the conduct of this consultation and >50% of this time involved direct face-to-face encounter for counseling and/or coordination of the patient's care.      Amherst Benson Norway, D.M.D.

## 2021-04-12 ENCOUNTER — Other Ambulatory Visit: Payer: Self-pay | Admitting: Hematology and Oncology

## 2021-04-12 ENCOUNTER — Telehealth: Payer: Self-pay

## 2021-04-12 ENCOUNTER — Other Ambulatory Visit (HOSPITAL_COMMUNITY): Payer: Self-pay

## 2021-04-12 ENCOUNTER — Encounter: Payer: Self-pay | Admitting: Hematology and Oncology

## 2021-04-12 DIAGNOSIS — Z0120 Encounter for dental examination and cleaning without abnormal findings: Secondary | ICD-10-CM

## 2021-04-12 DIAGNOSIS — F40232 Fear of other medical care: Secondary | ICD-10-CM | POA: Insufficient documentation

## 2021-04-12 DIAGNOSIS — K045 Chronic apical periodontitis: Secondary | ICD-10-CM | POA: Insufficient documentation

## 2021-04-12 DIAGNOSIS — K029 Dental caries, unspecified: Secondary | ICD-10-CM | POA: Insufficient documentation

## 2021-04-12 DIAGNOSIS — K036 Deposits [accretions] on teeth: Secondary | ICD-10-CM | POA: Insufficient documentation

## 2021-04-12 DIAGNOSIS — K0402 Irreversible pulpitis: Secondary | ICD-10-CM | POA: Insufficient documentation

## 2021-04-12 DIAGNOSIS — K08109 Complete loss of teeth, unspecified cause, unspecified class: Secondary | ICD-10-CM | POA: Insufficient documentation

## 2021-04-12 HISTORY — DX: Encounter for dental examination and cleaning without abnormal findings: Z01.20

## 2021-04-12 MED ORDER — MOLNUPIRAVIR EUA 200MG CAPSULE
4.0000 | ORAL_CAPSULE | Freq: Two times a day (BID) | ORAL | 0 refills | Status: AC
Start: 2021-04-12 — End: 2021-04-20
  Filled 2021-04-12: qty 40, 5d supply, fill #0

## 2021-04-12 NOTE — Progress Notes (Signed)
Patient has symptomatic COVID-19 infection I sent a prescription for Molnupiravir Chemo needs to be on hold for 3 weeks

## 2021-04-12 NOTE — Telephone Encounter (Signed)
Pt called and states she tested positive for COVID and is symptomatic. MD sent in molnupiravir for tx. Pt advised to call [facility] to let them know she has arrived. Schedule message sent to schedule 12/27 appropriately.

## 2021-04-20 ENCOUNTER — Other Ambulatory Visit (HOSPITAL_COMMUNITY): Payer: Self-pay

## 2021-04-25 NOTE — Progress Notes (Signed)
[facility] Follow up:    Robin Gordon, DO [address] [place] [address]   DIAGNOSIS:  Cancer Staging  Malignant neoplasm of upper-outer quadrant of left breast in female, estrogen receptor positive ([place]) Staging form: Breast, AJCC 8th Edition - Clinical stage from 12/23/2020: Stage IB (cT2, cN1, cM0, G3, ER+, PR+, HER2+) - Signed by [name], MD on 12/23/2020 Stage prefix: Initial diagnosis Histologic grading system: 3 grade system   SUMMARY OF ONCOLOGIC HISTORY: Oncology History  Left breast lump (Resolved)  Malignant neoplasm of upper-outer quadrant of left breast in female, estrogen receptor positive ([place])  12/17/2020 Initial Diagnosis   Palpable lump in the upper outer left breast. 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, Her2+ (3+)/ER+ (80%)/PR- (<1%) in the left breast, and invasive ductal carcinoma, Her2+ (3+)/ER+ (80%)/PR+ (3%) in the left axillary lymph node   12/23/2020 Cancer Staging   Staging form: Breast, AJCC 8th Edition - Clinical stage from 12/23/2020: Stage IB (cT2, cN1, cM0, G3, ER+, PR+, HER2+) - Signed by [name], MD on 12/23/2020 Stage prefix: Initial diagnosis Histologic grading system: 3 grade system    01/11/2021 -  Chemotherapy   Patient is on Treatment Plan : BREAST  Docetaxel + Carboplatin + Trastuzumab + Pertuzumab  (TCHP) q21d      01/31/2021 Genetic Testing   Negative hereditary cancer genetic testing: no pathogenic variants detected in [facility] Common Hereditary Cancers +RNA Panel.  The report date is January 31, 2021.    The Common Hereditary Cancers + RNA Panel offered by [facility] includes sequencing, deletion/duplication, and RNA testing of the following 47 genes: APC, ATM, AXIN2, BARD1, BMPR1A, BRCA1,  BRCA2, BRIP1, CDH1, CDK4*, CDKN2A (p14ARF)*, CDKN2A (p16INK4a)*, CHEK2, CTNNA1, DICER1, EPCAM (Deletion/duplication testing only), GREM1 (promoter region deletion/duplication testing only), KIT, MEN1, MLH1, MSH2, MSH3, MSH6, MUTYH, NBN, NF1, NHTL1, PALB2, PDGFRA*, PMS2, POLD1, POLE, PTEN, RAD50, RAD51C, RAD51D, SDHB, SDHC, SDHD, SMAD4, SMARCA4. STK11, TP53, TSC1, TSC2, and VHL.  The following genes were evaluated for sequence changes only: SDHA and HOXB13 c.251G>A variant only.  RNA analysis is not performed for the * genes.       CURRENT THERAPY: TCHP cycle 6  INTERVAL HISTORY: Robin Horn 35 y.o. female returns for evaluation prior to receiving her sixth and final cycle of  neoadjuvant TCHP.  She is tolerating this well thus far.  She is scheduled for MR breast on 04/27/2021.  Robin Horn is doing well today.  She is being seen in infusion because she was diagnosed with COVID 2 weeks ago.  She is recovering quite well from [place].  She has no further symptoms and has not for several days now.  She has had no fevers or chills.  She is without shortness of breath.  Robin Horn is tolerating her chemotherapy well.  She denies peripheral neuropathy.  She denies any nausea or diarrhea.  She has mild fatigue that begins on day 3 and resolves a few days later.  She wants to know more about potential upcoming surgery and reconstruction.   Patient Active Problem List   Diagnosis Date Noted   Encounter for dental examination 04/12/2021   Caries 04/12/2021   Teeth missing 04/12/2021   Accretions on teeth 04/12/2021   Phobia of dental procedure 04/12/2021   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  Genetic testing 02/03/2021   Family history of breast cancer 01/18/2021   Port-A-Cath in place 01/18/2021   Malignant neoplasm of upper-outer quadrant of left breast in female, estrogen receptor positive ([place]) 12/23/2020   Urinary problem 11/15/2020   Hypertension  11/15/2020   Encounter for female sterilization procedure 09/24/2016   Positive GBS test 09/23/2016   Supervision of other high risk pregnancy, antepartum 09/20/2016   Pregnancy affected by fetal growth restriction 09/20/2016   GDM (gestational diabetes mellitus) 09/11/2016   Gestational hypertension without significant proteinuria 08/29/2016   History of postpartum hemorrhage, currently pregnant 05/25/2016   Drug use affecting pregnancy, antepartum 05/25/2016   Bipolar 1 disorder ([place]) 05/25/2016   Insomnia 05/25/2016   Monochorionic diamniotic twin gestation in third trimester 04/25/2016    is allergic to tramadol, hydrocodone-acetaminophen, and lactose intolerance (gi).  MEDICAL HISTORY: Past Medical History:  Diagnosis Date   Abnormal Pap smear of cervix    Anemia    Anxiety    Arthritis    Cancer ([place])    Chlamydia    Depression    Family history of breast cancer 01/18/2021   GDM (gestational diabetes mellitus)    Gonorrhea    Herpes genitalia    History of hiatal hernia    Migraines    Ovarian cyst    Polycystic kidney disease    Preeclampsia    Pregnancy induced hypertension     SURGICAL HISTORY: Past Surgical History:  Procedure Laterality Date   COLPOSCOPY     DILATION AND CURETTAGE OF UTERUS     PORT A CATH REVISION Right 01/31/2021   Procedure: PORT A CATH REVISION;  Surgeon: Rolm Bookbinder, MD;  Location: [facility];  Service: General;  Laterality: Right;   PORTACATH PLACEMENT N/A 01/06/2021   Procedure: INSERTION PORT-A-CATH;  Surgeon: Rolm Bookbinder, MD;  Location: [facility];  Service: General;  Laterality: N/A;   TONSILLECTOMY     TUBAL LIGATION Bilateral 09/24/2016   Procedure: POST PARTUM TUBAL LIGATION;  Surgeon: Lavonia Drafts, MD;  Location: [facility];  Service: Gynecology;  Laterality: Bilateral;    SOCIAL HISTORY: Social History   Socioeconomic History   Marital status: Single    Spouse name: Not on file    Number of children: Not on file   Years of education: Not on file   Highest education level: Not on file  Occupational History   Not on file  Tobacco Use   Smoking status: Light Smoker    Packs/day: 1.00    Types: Cigarettes   Smokeless tobacco: Never   Tobacco comments:    Wants to restart Nicotine  Vaping Use   Vaping Use: Never used  Substance and Sexual Activity   Alcohol use: Not Currently    Alcohol/week: 1.0 standard drink    Types: 1 Glasses of wine per week    Comment: Once every 2 months. Nothing since finding out about pregnancy.    Drug use: No    Types: Cocaine    Comment: none since +preg   Sexual activity: Yes    Birth control/protection: None, Surgical    Comment: Tubal  Other Topics Concern   Not on file  Social History Narrative   Not on file   Social Determinants of Health   Financial Resource Strain: Not on file  Food Insecurity: Not on file  Transportation Needs: Not on file  Physical Activity: Not on file  Stress: Not on file  Social Connections: Not on file  Intimate Partner  Violence: Not on file    FAMILY HISTORY: Family History  Problem Relation Age of Onset   Kidney disease Mother    Breast cancer Maternal Aunt        dx 42s   Breast cancer Other        MGM's sister and MGM's niece; dx after 16   Other Neg Hx     Review of Systems  Constitutional:  Positive for fatigue. Negative for appetite change, chills, fever and unexpected weight change.  HENT:   Negative for hearing loss, lump/mass and trouble swallowing.   Eyes:  Negative for eye problems and icterus.  Respiratory:  Negative for chest tightness, cough and shortness of breath.   Cardiovascular:  Negative for chest pain, leg swelling and palpitations.  Gastrointestinal:  Negative for abdominal distention, abdominal pain, constipation, diarrhea, nausea and vomiting.  Endocrine: Negative for hot flashes.  Genitourinary:  Negative for difficulty urinating.   Musculoskeletal:   Negative for arthralgias.  Skin:  Negative for itching and rash.  Neurological:  Negative for dizziness, extremity weakness, headaches and numbness.  Hematological:  Negative for adenopathy. Does not bruise/bleed easily.  Psychiatric/Behavioral:  Negative for depression. The patient is not nervous/anxious.      PHYSICAL EXAMINATION  ECOG PERFORMANCE STATUS: 1 - Symptomatic but completely ambulatory  Vitals collected in infusion, see CHL  Physical Exam Constitutional:      General: She is not in acute distress.    Appearance: Normal appearance. She is not toxic-appearing.  HENT:     Head: Normocephalic and atraumatic.  Eyes:     General: No scleral icterus. Cardiovascular:     Rate and Rhythm: Normal rate and regular rhythm.     Pulses: Normal pulses.     Heart sounds: Normal heart sounds.  Pulmonary:     Effort: Pulmonary effort is normal.     Breath sounds: Normal breath sounds.  Abdominal:     General: Abdomen is flat. Bowel sounds are normal. There is no distension.     Palpations: Abdomen is soft.     Tenderness: There is no abdominal tenderness.  Musculoskeletal:        General: No swelling.     Cervical back: Neck supple.  Lymphadenopathy:     Cervical: No cervical adenopathy.  Skin:    General: Skin is warm and dry.     Findings: No rash.  Neurological:     General: No focal deficit present.     Mental Status: She is alert.  Psychiatric:        Mood and Affect: Mood normal.        Behavior: Behavior normal.    LABORATORY DATA:  CBC    Component Value Date/Time   WBC 4.1 04/26/2021 0912   WBC 9.4 01/25/2021 1638   RBC 2.43 (L) 04/26/2021 0912   HGB 7.4 (L) 04/26/2021 0912   HGB 11.2 09/08/2016 0848   HCT 23.3 (L) 04/26/2021 0912   HCT 34.7 09/08/2016 0848   PLT 226 04/26/2021 0912   PLT 193 09/08/2016 0848   MCV 95.9 04/26/2021 0912   MCV 89 09/08/2016 0848   MCH 30.5 04/26/2021 0912   MCHC 31.8 04/26/2021 0912   RDW 18.5 (H) 04/26/2021 0912    RDW 16.1 (H) 09/08/2016 0848   LYMPHSABS 1.5 04/26/2021 0912   MONOABS 0.4 04/26/2021 0912   EOSABS 0.0 04/26/2021 0912   BASOSABS 0.0 04/26/2021 0912    CMP     Component Value Date/Time  NA 141 04/26/2021 0912   K 3.6 04/26/2021 0912   CL 108 04/26/2021 0912   CO2 26 04/26/2021 0912   GLUCOSE 90 04/26/2021 0912   BUN 14 04/26/2021 0912   CREATININE 1.30 (H) 04/26/2021 0912   CALCIUM 8.9 04/26/2021 0912   PROT 6.3 (L) 04/26/2021 0912   ALBUMIN 3.7 04/26/2021 0912   AST 11 (L) 04/26/2021 0912   ALT 6 04/26/2021 0912   ALKPHOS 61 04/26/2021 0912   BILITOT 0.2 (L) 04/26/2021 0912   GFRNONAA 55 (L) 04/26/2021 0912   GFRAA >60 09/18/2018 2340       ASSESSMENT and THERAPY PLAN:   Malignant neoplasm of upper-outer quadrant of left breast in female, estrogen receptor positive ([place]) 12/17/2020: Palpable lump in the upper outer left breast. 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 Neoadjuvant chemotherapy with TCH Perjeta 6 cycles followed by Herceptin Perjeta maintenance versus Kadcyla maintenance (based on response to neoadjuvant chemo) for 1 year 2. Followed by breast conserving surgery versus mastectomy if possible with targeted node dissection 3. Followed by adjuvant radiation therapy  4.  Followed by adjuvant antiestrogen therapy -------------------------------------------------------------------------------------------------------------------------------------------------- Current treatment: Cycle 6 [place] Perjeta Echocardiogram 01/10/2021: EF 55 to 60%   Chemo toxicities: Mucositis:  resolved Severe fatigue Decreased oral intake/dehydration: encouraged continued oral hydration, she is tolerating gatorade without difficulty Reduced the dosage of cycle 2 chemotherapy.   Chemo induced anemia: Hemoglobin is 7.4 to receive one unit of blood today or tomorrow   Chronic pain: I renewed her prescription for oxycodone 21m.  She has an appointment with a pain physician and will subsequently receive her pain regimen through them.  Robin Horn doing well today.  She completes her final cycle of neoadjuvant chemotherapy.  She will proceed with MRI tomorrow.  She has follow-up with Dr. WDonne Hazelon January 3.  She is tolerating chemotherapy quite well with the exception of her chemotherapy-induced anemia for which she will receive 1 unit of blood.  She has questions about reconstruction and I have asked her breast navigator to set her up with Dr. PClaudia Desanctisin plastic surgery.  I also placed orders for her echocardiogram to be completed before her next appointment.   Return to clinic in 3 weeks for labs, f/u, and Herceptin     Orders Placed This Encounter  Procedures   ECHOCARDIOGRAM COMPLETE    Standing Status:   Future    Standing Expiration Date:   04/26/2022    Order Specific Question:   Where should this [place]    [place]   [facility]   Order Specific Question:   Perflutren DEFINITY (image enhancing agent) should be administered unless hypersensitivity or allergy exist    Answer:   Administer Perflutren    Order Specific Question:   Reason for exam-Echo    Answer:   Chemo  Z09    All questions were answered. The patient knows to call the clinic with any problems, questions or concerns. We can certainly see the patient much sooner if necessary.  Total encounter time: 40 minutes in face to face visit time,c hart review, lab review, order entry, care coordination and documentation of  the encounter.   Wilber Bihari, NP 04/26/21 10:37 AM Medical Oncology and  Hematology [facility] [address], [place] [id number] Tel. [phone]    Fax. [phone]  *Total Encounter Time as defined by the Centers for Medicare and Medicaid Services includes, in addition to the face-to-face time of a patient visit (documented in the note above) non-face-to-face time: obtaining and reviewing outside history, ordering and reviewing medications, tests or procedures, care coordination (communications with other health care professionals or caregivers) and documentation in the medical record.

## 2021-04-26 ENCOUNTER — Encounter: Payer: Self-pay | Admitting: *Deleted

## 2021-04-26 ENCOUNTER — Inpatient Hospital Stay (HOSPITAL_BASED_OUTPATIENT_CLINIC_OR_DEPARTMENT_OTHER): Payer: Medicaid Other | Admitting: Adult Health

## 2021-04-26 ENCOUNTER — Ambulatory Visit: Payer: Medicaid Other | Admitting: Physical Medicine and Rehabilitation

## 2021-04-26 ENCOUNTER — Other Ambulatory Visit: Payer: Self-pay | Admitting: *Deleted

## 2021-04-26 ENCOUNTER — Ambulatory Visit: Payer: Medicaid Other | Admitting: Hematology and Oncology

## 2021-04-26 ENCOUNTER — Ambulatory Visit: Payer: Medicaid Other | Admitting: Adult Health

## 2021-04-26 ENCOUNTER — Other Ambulatory Visit: Payer: Self-pay

## 2021-04-26 ENCOUNTER — Inpatient Hospital Stay: Payer: Medicaid Other

## 2021-04-26 ENCOUNTER — Encounter: Payer: Self-pay | Admitting: Adult Health

## 2021-04-26 ENCOUNTER — Other Ambulatory Visit: Payer: Medicaid Other

## 2021-04-26 ENCOUNTER — Ambulatory Visit: Payer: Medicaid Other

## 2021-04-26 VITALS — BP 139/93 | HR 96 | Temp 98.6°F | Resp 17 | Wt 157.1 lb

## 2021-04-26 DIAGNOSIS — T451X5A Adverse effect of antineoplastic and immunosuppressive drugs, initial encounter: Secondary | ICD-10-CM | POA: Diagnosis not present

## 2021-04-26 DIAGNOSIS — Z79899 Other long term (current) drug therapy: Secondary | ICD-10-CM | POA: Diagnosis not present

## 2021-04-26 DIAGNOSIS — C50412 Malignant neoplasm of upper-outer quadrant of left female breast: Secondary | ICD-10-CM

## 2021-04-26 DIAGNOSIS — D6481 Anemia due to antineoplastic chemotherapy: Secondary | ICD-10-CM | POA: Diagnosis not present

## 2021-04-26 DIAGNOSIS — Z5112 Encounter for antineoplastic immunotherapy: Secondary | ICD-10-CM | POA: Diagnosis not present

## 2021-04-26 DIAGNOSIS — Z5111 Encounter for antineoplastic chemotherapy: Secondary | ICD-10-CM | POA: Diagnosis not present

## 2021-04-26 DIAGNOSIS — Z170 Estrogen receptor positive status [ER+]: Secondary | ICD-10-CM

## 2021-04-26 DIAGNOSIS — Z5189 Encounter for other specified aftercare: Secondary | ICD-10-CM | POA: Diagnosis not present

## 2021-04-26 LAB — CBC WITH DIFFERENTIAL (CANCER CENTER ONLY)
Abs Immature Granulocytes: 0.01 10*3/uL (ref 0.00–0.07)
Basophils Absolute: 0 10*3/uL (ref 0.0–0.1)
Basophils Relative: 0 %
Eosinophils Absolute: 0 10*3/uL (ref 0.0–0.5)
Eosinophils Relative: 0 %
HCT: 23.3 % — ABNORMAL LOW (ref 36.0–46.0)
Hemoglobin: 7.4 g/dL — ABNORMAL LOW (ref 12.0–15.0)
Immature Granulocytes: 0 %
Lymphocytes Relative: 37 %
Lymphs Abs: 1.5 10*3/uL (ref 0.7–4.0)
MCH: 30.5 pg (ref 26.0–34.0)
MCHC: 31.8 g/dL (ref 30.0–36.0)
MCV: 95.9 fL (ref 80.0–100.0)
Monocytes Absolute: 0.4 10*3/uL (ref 0.1–1.0)
Monocytes Relative: 10 %
Neutro Abs: 2.2 10*3/uL (ref 1.7–7.7)
Neutrophils Relative %: 53 %
Platelet Count: 226 10*3/uL (ref 150–400)
RBC: 2.43 MIL/uL — ABNORMAL LOW (ref 3.87–5.11)
RDW: 18.5 % — ABNORMAL HIGH (ref 11.5–15.5)
WBC Count: 4.1 10*3/uL (ref 4.0–10.5)
nRBC: 0 % (ref 0.0–0.2)

## 2021-04-26 LAB — CMP (CANCER CENTER ONLY)
ALT: 6 U/L (ref 0–44)
AST: 11 U/L — ABNORMAL LOW (ref 15–41)
Albumin: 3.7 g/dL (ref 3.5–5.0)
Alkaline Phosphatase: 61 U/L (ref 38–126)
Anion gap: 7 (ref 5–15)
BUN: 14 mg/dL (ref 6–20)
CO2: 26 mmol/L (ref 22–32)
Calcium: 8.9 mg/dL (ref 8.9–10.3)
Chloride: 108 mmol/L (ref 98–111)
Creatinine: 1.3 mg/dL — ABNORMAL HIGH (ref 0.44–1.00)
GFR, Estimated: 55 mL/min — ABNORMAL LOW (ref >60)
Glucose, Bld: 90 mg/dL (ref 70–99)
Potassium: 3.6 mmol/L (ref 3.5–5.1)
Sodium: 141 mmol/L (ref 135–145)
Total Bilirubin: 0.2 mg/dL — ABNORMAL LOW (ref 0.3–1.2)
Total Protein: 6.3 g/dL — ABNORMAL LOW (ref 6.5–8.1)

## 2021-04-26 LAB — PREPARE RBC (CROSSMATCH)

## 2021-04-26 MED ORDER — DIPHENHYDRAMINE HCL 25 MG PO CAPS
25.0000 mg | ORAL_CAPSULE | Freq: Once | ORAL | Status: AC
  Administered 2021-04-26: 11:00:00 25 mg via ORAL
  Filled 2021-04-26: qty 1

## 2021-04-26 MED ORDER — ACETAMINOPHEN 325 MG PO TABS
650.0000 mg | ORAL_TABLET | Freq: Once | ORAL | Status: AC
  Administered 2021-04-26: 11:00:00 650 mg via ORAL
  Filled 2021-04-26: qty 2

## 2021-04-26 MED ORDER — SODIUM CHLORIDE 0.9 % IV SOLN
420.0000 mg | Freq: Once | INTRAVENOUS | Status: AC
  Administered 2021-04-26: 13:00:00 420 mg via INTRAVENOUS
  Filled 2021-04-26: qty 14

## 2021-04-26 MED ORDER — SODIUM CHLORIDE 0.9 % IV SOLN
10.0000 mg | Freq: Once | INTRAVENOUS | Status: AC
  Administered 2021-04-26: 11:00:00 10 mg via INTRAVENOUS
  Filled 2021-04-26: qty 10

## 2021-04-26 MED ORDER — HEPARIN SOD (PORK) LOCK FLUSH 100 UNIT/ML IV SOLN
500.0000 [IU] | Freq: Once | INTRAVENOUS | Status: AC | PRN
  Administered 2021-04-26: 16:00:00 500 [IU]

## 2021-04-26 MED ORDER — TRASTUZUMAB-DKST CHEMO 150 MG IV SOLR
6.0000 mg/kg | Freq: Once | INTRAVENOUS | Status: AC
  Administered 2021-04-26: 12:00:00 441 mg via INTRAVENOUS
  Filled 2021-04-26: qty 21

## 2021-04-26 MED ORDER — SODIUM CHLORIDE 0.9 % IV SOLN
474.5000 mg | Freq: Once | INTRAVENOUS | Status: AC
  Administered 2021-04-26: 15:00:00 470 mg via INTRAVENOUS
  Filled 2021-04-26: qty 47

## 2021-04-26 MED ORDER — PALONOSETRON HCL INJECTION 0.25 MG/5ML
0.2500 mg | Freq: Once | INTRAVENOUS | Status: AC
  Administered 2021-04-26: 11:00:00 0.25 mg via INTRAVENOUS
  Filled 2021-04-26: qty 5

## 2021-04-26 MED ORDER — SODIUM CHLORIDE 0.9 % IV SOLN
65.0000 mg/m2 | Freq: Once | INTRAVENOUS | Status: AC
  Administered 2021-04-26: 14:00:00 110 mg via INTRAVENOUS
  Filled 2021-04-26: qty 11

## 2021-04-26 MED ORDER — SODIUM CHLORIDE 0.9 % IV SOLN: Freq: Once | INTRAVENOUS | Status: AC

## 2021-04-26 MED ORDER — SODIUM CHLORIDE 0.9 % IV SOLN
150.0000 mg | Freq: Once | INTRAVENOUS | Status: AC
  Administered 2021-04-26: 11:00:00 150 mg via INTRAVENOUS
  Filled 2021-04-26: qty 150

## 2021-04-26 MED ORDER — SODIUM CHLORIDE 0.9% FLUSH
10.0000 mL | INTRAVENOUS | Status: DC | PRN
  Administered 2021-04-26: 16:00:00 10 mL

## 2021-04-26 NOTE — Patient Instructions (Signed)
[facility] ONCOLOGY  Discharge Instructions: Thank you for choosing [facility] to provide your oncology and hematology care.   If you have a lab appointment with the [facility], please go directly to the [facility] and check in at the registration area.   Wear comfortable clothing and clothing appropriate for easy access to any Portacath or PICC line.   We strive to give you quality time with your provider. You may need to reschedule your appointment if you arrive late (15 or more minutes).  Arriving late affects you and other patients whose appointments are after yours.  Also, if you miss three or more appointments without notifying the office, you may be dismissed from the clinic at the providers discretion.      For prescription refill requests, have your pharmacy contact our office and allow 72 hours for refills to be completed.    Today you received the following chemotherapy and/or immunotherapy agents: Ogivri, Perjeta, Docetaxel, & Carboplatin     To help prevent nausea and vomiting after your treatment, we encourage you to take your nausea medication as directed.  BELOW ARE SYMPTOMS THAT SHOULD BE REPORTED IMMEDIATELY: *FEVER GREATER THAN 100.4 F (38 C) OR HIGHER *CHILLS OR SWEATING *NAUSEA AND VOMITING THAT IS NOT CONTROLLED WITH YOUR NAUSEA MEDICATION *UNUSUAL SHORTNESS OF BREATH *UNUSUAL BRUISING OR BLEEDING *URINARY PROBLEMS (pain or burning when urinating, or frequent urination) *BOWEL PROBLEMS (unusual diarrhea, constipation, pain near the anus) TENDERNESS IN MOUTH AND THROAT WITH OR WITHOUT PRESENCE OF ULCERS (sore throat, sores in mouth, or a toothache) UNUSUAL RASH, SWELLING OR PAIN  UNUSUAL VAGINAL DISCHARGE OR ITCHING   Items with * indicate a potential emergency and should be followed up as soon as possible or go to the Emergency Department if any problems should occur.  Please show the CHEMOTHERAPY ALERT CARD or  IMMUNOTHERAPY ALERT CARD at check-in to the Emergency Department and triage nurse.  Should you have questions after your visit or need to cancel or reschedule your appointment, please contact [facility]  Dept: [phone]  and follow the prompts.  Office hours are 8:00 a.m. to 4:30 p.m. Monday - Friday. Please note that voicemails left after 4:00 p.m. may not be returned until the following business day.  We are closed weekends and major holidays. You have access to a nurse at all times for urgent questions. Please call the main number to the clinic Dept: [phone] and follow the prompts.   For any non-urgent questions, you may also contact your provider using MyChart. We now offer e-Visits for anyone 82 and older to request care online for non-urgent symptoms. For details visit [email or web address].   Also download the MyChart app! Go to the app store, search "MyChart", open the app, select , and log in with your MyChart username and password.  Due to Covid, a mask is required upon entering the hospital/clinic. If you do not have a mask, one will be given to you upon arrival. For doctor visits, patients may have 1 support person aged 49 or older with them. For treatment visits, patients cannot have anyone with them due to current Covid guidelines and our immunocompromised population.

## 2021-04-26 NOTE — Progress Notes (Signed)
OK to trt today w/ Hgb 7.4 per Suzan Garibaldi, NP  Pt to have a T&S done prior to trtmt today.

## 2021-04-26 NOTE — Assessment & Plan Note (Signed)
12/17/2020:Palpable lump in the upper outer left breast.Diagnostic mammogram and [place] on08/17/22showed multiple suspicious masses, 2abnormal lymph nodes with increased cortical thickness,1 lymph node with borderline abnormal cortical thickness,Biopsy on08/19/22showed invasive ductal carcinoma and DCIS, Her2+ (3+)/ER+ (80%)/PR- (<1%) in the left breast, and invasive ductal carcinoma, Her2+ (3+)/ER+ (80%)/PR+ (3%) in the left axillary lymph node T1CN1A stage Ib  Treatment plan: 1. Neoadjuvant chemotherapy with TCH Perjeta 6 cycles followed by Herceptin Perjeta maintenance versus Kadcyla maintenance (based on response to neoadjuvant chemo) for 1 year 2. Followed by breast conserving surgeryversus mastectomyif possible with targeted node dissection 3. Followed by adjuvant radiation therapy 4.Followed by adjuvant antiestrogen therapy -------------------------------------------------------------------------------------------------------------------------------------------------- Current treatment: [place] Echocardiogram 01/10/2021: EF 55 to 60%  Chemo toxicities: 1. Mucositis: resolved 2. Severe fatigue 3. Decreased oral intake/dehydration: encouraged continued oral hydration, she is tolerating gatorade without difficulty 4. Reducedthe dosage of cycle 2 chemotherapy.  5. Chemo induced anemia: Hemoglobin is 7.4 to receive one unit of blood today or tomorrow  Chronic pain: I renewed her prescription for oxycodone 25m. She has an appointment with a pain physician and will subsequently receive her pain regimen through them.  AGlorianis doing well today.  She completes her final cycle of neoadjuvant chemotherapy.  She will proceed with MRI tomorrow.  She has follow-up with Dr. WDonne Hazelon January 3.  She is tolerating chemotherapy quite well with the exception of her chemotherapy-induced anemia for which she will receive 1 unit of blood.  She has questions about reconstruction  and I have asked her breast navigator to set her up with Dr. PClaudia Desanctisin plastic surgery.  I also placed orders for her echocardiogram to be completed before her next appointment.  Return to clinic in3weeks for labs, f/u, and Herceptin

## 2021-04-27 ENCOUNTER — Other Ambulatory Visit: Payer: Medicaid Other

## 2021-04-28 ENCOUNTER — Ambulatory Visit (INDEPENDENT_AMBULATORY_CARE_PROVIDER_SITE_OTHER): Payer: Medicaid Other | Admitting: Plastic Surgery

## 2021-04-28 ENCOUNTER — Inpatient Hospital Stay: Payer: Medicaid Other

## 2021-04-28 ENCOUNTER — Other Ambulatory Visit: Payer: Self-pay

## 2021-04-28 ENCOUNTER — Encounter: Payer: Self-pay | Admitting: *Deleted

## 2021-04-28 VITALS — BP 109/71 | HR 97 | Ht 62.0 in | Wt 160.0 lb

## 2021-04-28 DIAGNOSIS — C50412 Malignant neoplasm of upper-outer quadrant of left female breast: Secondary | ICD-10-CM

## 2021-04-28 DIAGNOSIS — Z170 Estrogen receptor positive status [ER+]: Secondary | ICD-10-CM | POA: Diagnosis not present

## 2021-04-28 NOTE — Progress Notes (Signed)
Referring Provider [name], DO [address] [place],  [place] [address]   CC:  Chief Complaint  Patient presents with   Consult      Robin Horn is an 35 y.o. female.  HPI: Patient presents to discuss options for breast reconstruction.  She was diagnosed with a left-sided cancer and has now finished her neoadjuvant chemotherapy.  She is in the process of planning for her surgical treatment.  It sounds like she has had discussions with her oncologist and is leaning towards bilateral mastectomies.  She would like to hear about her options for reconstruction.  She currently smokes every day tobacco products.  She did have a left axillary lymph node biopsy which was positive for cancer as well.  She would like to be smaller than she currently is after her reconstruction is completed  Allergies  Allergen Reactions   Tramadol Itching   Hydrocodone-Acetaminophen Hives   Lactose Intolerance (Gi) Diarrhea    Outpatient Encounter Medications as of 04/28/2021  Medication Sig   acetaminophen (TYLENOL) 325 MG tablet Take 500 mg by mouth every 6 (six) hours as needed.   cetirizine (ZYRTEC) 10 MG tablet Take 10 mg by mouth daily.   gabapentin (NEURONTIN) 100 MG capsule Take 1 capsule (100 mg total) by mouth 2 (two) times daily.   lidocaine (XYLOCAINE) 2 % solution Use as directed 15 mLs in the mouth or throat every 3 (three) hours as needed for mouth pain.   lidocaine-prilocaine (EMLA) cream Apply to affected area once   lisinopril (ZESTRIL) 10 MG tablet Take 10 mg by mouth daily.   ondansetron (ZOFRAN) 8 MG tablet Take 1 tablet (8 mg total) by mouth 2 (two) times daily as needed (Nausea or vomiting). Start on the third day after chemotherapy.   oxyCODONE (ROXICODONE) 15 MG immediate release tablet Take 1 tablet (15 mg total) by mouth every 8 (eight) hours as needed for pain.   prochlorperazine (COMPAZINE) 10 MG tablet Take 1 tablet (10 mg total) by mouth every 6 (six) hours as needed  (Nausea or vomiting).   QUEtiapine (SEROQUEL) 100 MG tablet Take 1 tablet (100 mg total) by mouth at bedtime.   valACYclovir (VALTREX) 1000 MG tablet Take 1,000 mg by mouth daily.   zolpidem (AMBIEN) 5 MG tablet Take 1 tablet (5 mg total) by mouth at bedtime as needed.   [DISCONTINUED] medroxyPROGESTERone Acetate 150 MG/ML SUSY Inject into the muscle.   [DISCONTINUED] tiZANidine (ZANAFLEX) 4 MG tablet Take 4 mg by mouth 3 (three) times daily.   [EXPIRED] CARBOplatin (PARAPLATIN) 470 mg in sodium chloride 0.9 % 250 mL chemo infusion    [EXPIRED] DOCEtaxel (TAXOTERE) 110 mg in sodium chloride 0.9 % 250 mL chemo infusion    [EXPIRED] heparin lock flush 100 unit/mL    [DISCONTINUED] sodium chloride flush (NS) 0.9 % injection 10 mL    No facility-administered encounter medications on file as of 04/28/2021.     Past Medical History:  Diagnosis Date   Abnormal Pap smear of cervix    Anemia    Anxiety    Arthritis    Cancer ([facility])    Chlamydia    Depression    Family history of breast cancer 01/18/2021   GDM (gestational diabetes mellitus)    Gonorrhea    Herpes genitalia    History of hiatal hernia    Migraines    Ovarian cyst    Polycystic kidney disease    Preeclampsia    Pregnancy induced hypertension  Past Surgical History:  Procedure Laterality Date   COLPOSCOPY     DILATION AND CURETTAGE OF UTERUS     PORT A CATH REVISION Right 01/31/2021   Procedure: PORT A CATH REVISION;  Surgeon: Rolm Bookbinder, MD;  Location: [facility];  Service: General;  Laterality: Right;   PORTACATH PLACEMENT N/A 01/06/2021   Procedure: INSERTION PORT-A-CATH;  Surgeon: Rolm Bookbinder, MD;  Location: [facility];  Service: General;  Laterality: N/A;   TONSILLECTOMY     TUBAL LIGATION Bilateral 09/24/2016   Procedure: POST PARTUM TUBAL LIGATION;  Surgeon: Lavonia Drafts, MD;  Location: [facility];  Service: Gynecology;  Laterality: Bilateral;    Family History   Problem Relation Age of Onset   Kidney disease Mother    Breast cancer Maternal Aunt        dx 38s   Breast cancer Other        MGM's sister and MGM's niece; dx after 23   Other Neg Hx     Social History   Social History Narrative   Not on file     Review of Systems General: Denies fevers, chills, weight loss CV: Denies chest pain, shortness of breath, palpitations  Physical Exam Vitals with BMI 04/28/2021 04/26/2021 04/08/2021  Height 5\' 2"  - -  Weight 160 lbs 157 lbs 2 oz -  BMI 03.88 - -  Systolic 828 003 491  Diastolic 71 93 69  Pulse 97 96 72    General:  No acute distress,  Alert and oriented, Non-Toxic, Normal speech and affect Breast: She has grade 2 ptosis.  Probably a D cup in size.  No obvious scars.  I believe she is larger on the left side.  Base width 12.5 cm  Assessment/Plan Patient is a reasonable candidate for immediate breast reconstruction.  She is confident she can stop all nicotine products.  We discussed the reconstructive process which would involve a tissue expander placement at the time of her mastectomy which would be inflated to a level of her choosing within reason and then switched out for a permanent implant.  We discussed risks of the procedure that include bleeding, infection, damage surrounding structures need for additional procedures.  We discussed wound healing complications that might result in loss of the implant.  We discussed the need for drains postoperatively.  I did discuss with her that in my opinion she is not the best candidate for a nipple sparing mastectomy given her current degree of ptosis and she is not bothered by that.  All her questions were answered.  Cindra Presume 04/28/2021, 3:29 PM

## 2021-04-29 ENCOUNTER — Other Ambulatory Visit: Payer: Self-pay | Admitting: *Deleted

## 2021-04-29 ENCOUNTER — Inpatient Hospital Stay: Payer: Medicaid Other

## 2021-04-29 ENCOUNTER — Other Ambulatory Visit: Payer: Self-pay | Admitting: Adult Health

## 2021-04-29 VITALS — BP 123/82 | HR 76 | Temp 98.4°F | Resp 16

## 2021-04-29 DIAGNOSIS — Z5111 Encounter for antineoplastic chemotherapy: Secondary | ICD-10-CM | POA: Diagnosis not present

## 2021-04-29 DIAGNOSIS — D6481 Anemia due to antineoplastic chemotherapy: Secondary | ICD-10-CM | POA: Diagnosis not present

## 2021-04-29 DIAGNOSIS — Z5189 Encounter for other specified aftercare: Secondary | ICD-10-CM | POA: Diagnosis not present

## 2021-04-29 DIAGNOSIS — Z170 Estrogen receptor positive status [ER+]: Secondary | ICD-10-CM

## 2021-04-29 DIAGNOSIS — Z5112 Encounter for antineoplastic immunotherapy: Secondary | ICD-10-CM | POA: Diagnosis not present

## 2021-04-29 DIAGNOSIS — C50412 Malignant neoplasm of upper-outer quadrant of left female breast: Secondary | ICD-10-CM | POA: Diagnosis not present

## 2021-04-29 DIAGNOSIS — T451X5A Adverse effect of antineoplastic and immunosuppressive drugs, initial encounter: Secondary | ICD-10-CM | POA: Diagnosis not present

## 2021-04-29 DIAGNOSIS — Z79899 Other long term (current) drug therapy: Secondary | ICD-10-CM | POA: Diagnosis not present

## 2021-04-29 MED ORDER — ACETAMINOPHEN 325 MG PO TABS
650.0000 mg | ORAL_TABLET | Freq: Once | ORAL | Status: DC
  Filled 2021-04-29: qty 2

## 2021-04-29 MED ORDER — SODIUM CHLORIDE 0.9% FLUSH
10.0000 mL | INTRAVENOUS | Status: AC | PRN
  Administered 2021-04-29: 16:00:00 10 mL

## 2021-04-29 MED ORDER — HEPARIN SOD (PORK) LOCK FLUSH 100 UNIT/ML IV SOLN
250.0000 [IU] | INTRAVENOUS | Status: AC | PRN
  Administered 2021-04-29: 16:00:00 250 [IU]

## 2021-04-29 MED ORDER — SODIUM CHLORIDE 0.9% IV SOLUTION
250.0000 mL | Freq: Once | INTRAVENOUS | Status: AC
  Administered 2021-04-29: 14:00:00 250 mL via INTRAVENOUS

## 2021-04-29 MED ORDER — PEGFILGRASTIM-CBQV 6 MG/0.6ML ~~LOC~~ SOSY
6.0000 mg | PREFILLED_SYRINGE | Freq: Once | SUBCUTANEOUS | Status: AC
  Administered 2021-04-29: 16:00:00 6 mg via SUBCUTANEOUS
  Filled 2021-04-29: qty 0.6

## 2021-04-29 MED ORDER — DIPHENHYDRAMINE HCL 25 MG PO CAPS
25.0000 mg | ORAL_CAPSULE | Freq: Once | ORAL | Status: DC
  Filled 2021-04-29: qty 1

## 2021-04-29 NOTE — Progress Notes (Signed)
Pt tolerated prbc well along with injection. Educated pt on blood transfusion reaction symptoms. Advised pt to call office with concerns or go to ED. Pt verbalized understanding.

## 2021-04-30 ENCOUNTER — Other Ambulatory Visit: Payer: Self-pay | Admitting: Hematology and Oncology

## 2021-05-01 DIAGNOSIS — Z419 Encounter for procedure for purposes other than remedying health state, unspecified: Secondary | ICD-10-CM | POA: Diagnosis not present

## 2021-05-01 LAB — BPAM RBC
Blood Product Expiration Date: 202301012359
ISSUE DATE / TIME: 202212301344
Unit Type and Rh: 6200

## 2021-05-01 LAB — TYPE AND SCREEN
ABO/RH(D): AB POS
Antibody Screen: NEGATIVE
Unit division: 0

## 2021-05-03 ENCOUNTER — Other Ambulatory Visit (HOSPITAL_COMMUNITY): Payer: Self-pay

## 2021-05-03 ENCOUNTER — Other Ambulatory Visit: Payer: Self-pay

## 2021-05-03 ENCOUNTER — Ambulatory Visit
Admission: RE | Admit: 2021-05-03 | Discharge: 2021-05-03 | Disposition: A | Discharge status: Home / Self Care | Payer: Medicaid Other | Source: Ambulatory Visit | Attending: Hematology and Oncology | Admitting: Hematology and Oncology

## 2021-05-03 ENCOUNTER — Encounter: Payer: Self-pay | Admitting: Hematology and Oncology

## 2021-05-03 DIAGNOSIS — R590 Localized enlarged lymph nodes: Secondary | ICD-10-CM | POA: Diagnosis not present

## 2021-05-03 DIAGNOSIS — C50912 Malignant neoplasm of unspecified site of left female breast: Secondary | ICD-10-CM | POA: Diagnosis not present

## 2021-05-03 DIAGNOSIS — Z170 Estrogen receptor positive status [ER+]: Secondary | ICD-10-CM

## 2021-05-03 DIAGNOSIS — C50412 Malignant neoplasm of upper-outer quadrant of left female breast: Secondary | ICD-10-CM | POA: Diagnosis not present

## 2021-05-03 DIAGNOSIS — R160 Hepatomegaly, not elsewhere classified: Secondary | ICD-10-CM | POA: Diagnosis not present

## 2021-05-03 MED ORDER — GADOBUTROL 1 MMOL/ML IV SOLN
7.0000 mL | Freq: Once | INTRAVENOUS | Status: AC | PRN
  Administered 2021-05-03: 7 mL via INTRAVENOUS

## 2021-05-03 MED ORDER — OXYCODONE HCL 15 MG PO TABS
15.0000 mg | ORAL_TABLET | Freq: Three times a day (TID) | ORAL | 0 refills | Status: DC | PRN
  Filled 2021-05-03 – 2021-05-06 (×2): qty 90, 30d supply, fill #0

## 2021-05-05 ENCOUNTER — Other Ambulatory Visit (HOSPITAL_COMMUNITY): Payer: Self-pay

## 2021-05-06 ENCOUNTER — Other Ambulatory Visit (HOSPITAL_COMMUNITY): Payer: Self-pay

## 2021-05-06 ENCOUNTER — Other Ambulatory Visit: Payer: Medicaid Other

## 2021-05-06 ENCOUNTER — Encounter: Payer: Self-pay | Admitting: Hematology and Oncology

## 2021-05-09 ENCOUNTER — Encounter: Payer: Self-pay | Admitting: *Deleted

## 2021-05-10 ENCOUNTER — Inpatient Hospital Stay (HOSPITAL_COMMUNITY): Admission: RE | Admit: 2021-05-10 | Payer: Medicaid Other | Source: Ambulatory Visit

## 2021-05-12 ENCOUNTER — Other Ambulatory Visit: Payer: Medicaid Other

## 2021-05-13 ENCOUNTER — Encounter: Payer: Self-pay | Admitting: *Deleted

## 2021-05-16 ENCOUNTER — Other Ambulatory Visit: Payer: Self-pay | Admitting: General Surgery

## 2021-05-16 DIAGNOSIS — Z170 Estrogen receptor positive status [ER+]: Secondary | ICD-10-CM

## 2021-05-16 DIAGNOSIS — C50412 Malignant neoplasm of upper-outer quadrant of left female breast: Secondary | ICD-10-CM

## 2021-05-16 NOTE — Pre-Procedure Instructions (Addendum)
Surgical Instructions    Your procedure is scheduled on Monday, January 23rd.  Report to [facility] Main Entrance "A" at 09:30 A.M., then check in with the Admitting office.  Call this number if you have problems the morning of surgery:  [phone]   If you have any questions prior to your surgery date call [phone]: Open Monday-Friday 8am-4pm    Remember:  Do not eat after midnight the night before your surgery  You may drink clear liquids until 08:30 AM the morning of your surgery.   Clear liquids allowed are: Water, Non-Citrus Juices (without pulp), Carbonated Beverages, Clear Tea, Black Coffee Only, and Gatorade   Patient Instructions  The night before surgery:  No food after midnight. ONLY clear liquids after midnight  The day of surgery (if you do NOT have diabetes):  Drink ONE (1) Pre-Surgery Clear Ensure by 08:30 AM the morning of surgery. Drink in one sitting. Do not sip.  This drink was given to you during your hospital  pre-op appointment visit.  Nothing else to drink after completing the  Pre-Surgery Clear Ensure.          If you have questions, please contact your surgeons office.     Take these medicines the morning of surgery with A SIP OF WATER    If needed: acetaminophen (TYLENOL)  oxyCODONE (ROXICODONE)    As of today, STOP taking any Aspirin (unless otherwise instructed by your surgeon) Aleve, Naproxen, Ibuprofen, Motrin, Advil, Goody's, BC's, all herbal medications, fish oil, and all vitamins.                     Do NOT Smoke (Tobacco/Vaping) or drink Alcohol 24 hours prior to your procedure.  If you use a CPAP at night, you may bring all equipment for your overnight stay.   Contacts, glasses, piercing's, hearing aid's, dentures or partials may not be worn into surgery, please bring cases for these belongings.    For patients admitted to the hospital, discharge time will be determined by your treatment team.   Patients discharged the  day of surgery will not be allowed to drive home, and someone needs to stay with them for 24 hours.  NO VISITORS WILL BE ALLOWED IN PRE-OP WHERE PATIENTS GET READY FOR SURGERY.  ONLY 1 SUPPORT PERSON MAY BE PRESENT IN THE WAITING ROOM WHILE YOU ARE IN SURGERY.  IF YOU ARE TO BE ADMITTED, ONCE YOU ARE IN YOUR ROOM YOU WILL BE ALLOWED TWO (2) VISITORS.  Minor children may have two parents present. Special consideration for safety and communication needs will be reviewed on a case by case basis.   Special instructions:   [facility]- Preparing For Surgery  Before surgery, you can play an important role. Because skin is not sterile, your skin needs to be as free of germs as possible. You can reduce the number of germs on your skin by washing with CHG (chlorahexidine gluconate) Soap before surgery.  CHG is an antiseptic cleaner which kills germs and bonds with the skin to continue killing germs even after washing.    Oral Hygiene is also important to reduce your risk of infection.  Remember - BRUSH YOUR TEETH THE MORNING OF SURGERY WITH YOUR REGULAR TOOTHPASTE  Please do not use if you have an allergy to CHG or antibacterial soaps. If your skin becomes reddened/irritated stop using the CHG.  Do not shave (including legs and underarms) for at least 48 hours prior to first CHG shower.  It is OK to shave your face.  Please follow these instructions carefully.   Shower the NIGHT BEFORE SURGERY and the MORNING OF SURGERY  If you chose to wash your hair, wash your hair first as usual with your normal shampoo.  After you shampoo, rinse your hair and body thoroughly to remove the shampoo.  Use CHG Soap as you would any other liquid soap. You can apply CHG directly to the skin and wash gently with a scrungie or a clean washcloth.   Apply the CHG Soap to your body ONLY FROM THE NECK DOWN.  Do not use on open wounds or open sores. Avoid contact with your eyes, ears, mouth and genitals (private parts). Wash  Face and genitals (private parts)  with your normal soap.   Wash thoroughly, paying special attention to the area where your surgery will be performed.  Thoroughly rinse your body with warm water from the neck down.  DO NOT shower/wash with your normal soap after using and rinsing off the CHG Soap.  Pat yourself dry with a CLEAN TOWEL.  Wear CLEAN PAJAMAS to bed the night before surgery  Place CLEAN SHEETS on your bed the night before your surgery  DO NOT SLEEP WITH PETS.   Day of Surgery: Shower with CHG soap. Do not wear jewelry, make up, nail polish, gel polish, artificial nails, or any other type of covering on natural nails including finger and toenails. If patients have artificial nails, gel coating, etc. that need to be removed by a nail salon please have this removed prior to surgery. Surgery may need to be canceled/delayed if the surgeon/ anesthesia feels like the patient is unable to be adequately monitored. Do not wear lotions, powders, perfumes, or deodorant. Do not shave 48 hours prior to surgery.   Do not bring valuables to the hospital. [facility] is not responsible for any belongings or valuables. Wear Clean/Comfortable clothing the morning of surgery Remember to brush your teeth WITH YOUR REGULAR TOOTHPASTE.   Please read over the following fact sheets that you were given.   3 days prior to your procedure or After your COVID test   You are not required to quarantine however you are required to wear a well-fitting mask when you are out and around people not in your household. If your mask becomes wet or soiled, replace with a new one.   Wash your hands often with soap and water for 20 seconds or clean your hands with an alcohol-based hand sanitizer that contains at least 60% alcohol.   Do not share personal items.   Notify your provider:  o if you are in close contact with someone who has COVID  o or if you develop a fever of 100.4 or greater, sneezing,  cough, sore throat, shortness of breath or body aches.

## 2021-05-17 ENCOUNTER — Other Ambulatory Visit: Payer: Self-pay

## 2021-05-17 ENCOUNTER — Other Ambulatory Visit: Payer: Self-pay | Admitting: General Surgery

## 2021-05-17 ENCOUNTER — Encounter (HOSPITAL_COMMUNITY): Payer: Self-pay

## 2021-05-17 ENCOUNTER — Inpatient Hospital Stay: Payer: Medicaid Other

## 2021-05-17 ENCOUNTER — Telehealth: Payer: Self-pay

## 2021-05-17 ENCOUNTER — Encounter (HOSPITAL_COMMUNITY)
Admission: RE | Admit: 2021-05-17 | Discharge: 2021-05-17 | Disposition: A | Discharge status: Home / Self Care | Payer: Medicaid Other | Source: Ambulatory Visit | Attending: General Surgery | Admitting: General Surgery

## 2021-05-17 ENCOUNTER — Other Ambulatory Visit: Payer: Self-pay | Admitting: Hematology and Oncology

## 2021-05-17 ENCOUNTER — Inpatient Hospital Stay: Payer: Medicaid Other | Attending: Hematology and Oncology | Admitting: Adult Health

## 2021-05-17 VITALS — BP 132/89 | HR 80 | Temp 98.5°F | Resp 17 | Ht 62.0 in | Wt 152.4 lb

## 2021-05-17 DIAGNOSIS — I10 Essential (primary) hypertension: Secondary | ICD-10-CM | POA: Diagnosis not present

## 2021-05-17 DIAGNOSIS — C50412 Malignant neoplasm of upper-outer quadrant of left female breast: Secondary | ICD-10-CM

## 2021-05-17 DIAGNOSIS — Z170 Estrogen receptor positive status [ER+]: Secondary | ICD-10-CM

## 2021-05-17 DIAGNOSIS — Z01812 Encounter for preprocedural laboratory examination: Secondary | ICD-10-CM | POA: Diagnosis not present

## 2021-05-17 DIAGNOSIS — Z01818 Encounter for other preprocedural examination: Secondary | ICD-10-CM

## 2021-05-17 HISTORY — DX: Bipolar disorder, unspecified: F31.9

## 2021-05-17 HISTORY — DX: Gastro-esophageal reflux disease without esophagitis: K21.9

## 2021-05-17 LAB — CBC
HCT: 26 % — ABNORMAL LOW (ref 36.0–46.0)
Hemoglobin: 8.3 g/dL — ABNORMAL LOW (ref 12.0–15.0)
MCH: 31 pg (ref 26.0–34.0)
MCHC: 31.9 g/dL (ref 30.0–36.0)
MCV: 97 fL (ref 80.0–100.0)
Platelets: 121 10*3/uL — ABNORMAL LOW (ref 150–400)
RBC: 2.68 MIL/uL — ABNORMAL LOW (ref 3.87–5.11)
RDW: 16.5 % — ABNORMAL HIGH (ref 11.5–15.5)
WBC: 6.1 10*3/uL (ref 4.0–10.5)
nRBC: 0 % (ref 0.0–0.2)

## 2021-05-17 LAB — BASIC METABOLIC PANEL
Anion gap: 8 (ref 5–15)
BUN: 13 mg/dL (ref 6–20)
CO2: 28 mmol/L (ref 22–32)
Calcium: 9.2 mg/dL (ref 8.9–10.3)
Chloride: 101 mmol/L (ref 98–111)
Creatinine, Ser: 1.11 mg/dL — ABNORMAL HIGH (ref 0.44–1.00)
GFR, Estimated: >60 mL/min (ref >60)
Glucose, Bld: 94 mg/dL (ref 70–99)
Potassium: 3.6 mmol/L (ref 3.5–5.1)
Sodium: 137 mmol/L (ref 135–145)

## 2021-05-17 LAB — POCT PREGNANCY, URINE: Preg Test, Ur: NEGATIVE

## 2021-05-17 NOTE — Progress Notes (Signed)
PCP - Dr. [name] Cardiologist - denies  PPM/ICD - denies   Chest x-ray - 01/31/21 EKG - 01/27/21 Stress Test - denies ECHO - 01/10/21 Cardiac Cath - denies  Sleep Study - denies   DM- denies  Blood Thinner Instructions: n/a Aspirin Instructions: n/a  ERAS Protcol - yes PRE-SURGERY Ensure given at PAT  COVID TEST- pt scheduled for testing on 05/19/21   Anesthesia review: no  Patient denies shortness of breath, fever, cough and chest pain at PAT appointment   All instructions explained to the patient, with a verbal understanding of the material. Patient agrees to go over the instructions while at home for a better understanding. Patient also instructed to wear a mask in public after being tested for COVID-19. The opportunity to ask questions was provided.

## 2021-05-17 NOTE — Telephone Encounter (Signed)
L/M returning patient's call. She has a f/up on the 19th with Donna Christen and he can discuss a script to [facility].

## 2021-05-17 NOTE — Telephone Encounter (Signed)
Patient would like to know where to get her compression bra.  Please call.

## 2021-05-18 ENCOUNTER — Telehealth: Payer: Self-pay | Admitting: Hematology and Oncology

## 2021-05-18 NOTE — Telephone Encounter (Signed)
[facility] PER 1/17 INBASKET, PT AWARE

## 2021-05-19 ENCOUNTER — Other Ambulatory Visit: Payer: Self-pay

## 2021-05-19 ENCOUNTER — Ambulatory Visit (INDEPENDENT_AMBULATORY_CARE_PROVIDER_SITE_OTHER): Payer: Medicaid Other | Admitting: Physician Assistant

## 2021-05-19 ENCOUNTER — Other Ambulatory Visit (HOSPITAL_COMMUNITY)
Admission: RE | Admit: 2021-05-19 | Discharge: 2021-05-19 | Disposition: A | Discharge status: Home / Self Care | Payer: Medicaid Other | Source: Ambulatory Visit | Attending: General Surgery | Admitting: General Surgery

## 2021-05-19 ENCOUNTER — Encounter: Payer: Self-pay | Admitting: Physician Assistant

## 2021-05-19 VITALS — BP 144/98 | HR 81 | Ht 62.0 in | Wt 151.6 lb

## 2021-05-19 DIAGNOSIS — C50412 Malignant neoplasm of upper-outer quadrant of left female breast: Secondary | ICD-10-CM

## 2021-05-19 DIAGNOSIS — Z170 Estrogen receptor positive status [ER+]: Secondary | ICD-10-CM

## 2021-05-19 MED ORDER — ONDANSETRON 4 MG PO TBDP: 4.0000 mg | ORAL_TABLET | Freq: Three times a day (TID) | ORAL | 0 refills | Status: DC | PRN

## 2021-05-19 MED ORDER — SULFAMETHOXAZOLE-TRIMETHOPRIM 800-160 MG PO TABS: 1.0000 | ORAL_TABLET | Freq: Two times a day (BID) | ORAL | 0 refills | Status: AC

## 2021-05-19 NOTE — Progress Notes (Signed)
Pt not tested for covid today due to pt testing + for covid on 03/03/21. Pt stated that this was a home test. There is documentation in Epic where she contacted Dr. Geralyn Flash office on 03/03/21 regarding her symptoms. Shortly after that phone call, pt took a home test and tested positive. Based on the guidelines the pt is in the 90 day window to not retest. Therefore, the pt can still have the scheduled procedure. These are the guidelines as follows:  Guidance: Patient previously tested + COVID; now past 90 day window seeking elective surgery (asymptomatic)  Retest patient If negative, proceed with surgery If positive, postpone surgery for 10 days from positive test Patient to quarantine for the (10 days) Do not retest again prior to surgery (even if scheduled a couple of weeks out) Use standard precautions for surgery

## 2021-05-19 NOTE — Progress Notes (Addendum)
Patient ID: Robin Horn, female    DOB: [birth year], 36 y.o.   MRN: [id number]  Chief Complaint  Patient presents with   Pre-op Exam      ICD-10-CM   1. Malignant neoplasm of upper-outer quadrant of left breast in female, estrogen receptor positive ([place])  C50.412    Z17.0        History of Present Illness: Robin Horn is a 36 y.o.  female  with a history of left-sided breast cancer.  She presents for preoperative evaluation for upcoming procedure, bilateral mastectomy with left-sided lymph node dissection and immediate breast reconstruction using tissue expander and Flex HD, scheduled for 05/23/2021 with Dr. Claudia Desanctis.  The patient has not had problems with anesthesia.  She states that she has cut back to half a pack per day smoking.  She reports that she is on oxycodone 15 mg 3 times daily, but still has to go to a methadone clinic in the morning to help with breakthrough pains.  She inquires about how that will work with her surgery.  I told her that she will need to discuss this with her pain management specialist.  She denies any personal history of keloiding or hypertrophic scarring.  No tape allergies.  She confirms that she is a D cup and like to be smaller.  Denies any personal or family history of blood clots or clotting disorder.  She also denies any history of asthma or COPD.  She has young children at home and is concerned about being able to provide care for them during her postoperative recovery.  She does however state that she is currently living with her mother.  Summary of Previous Visit: Patient was seen for consult 05/29/2020 by Dr. Claudia Desanctis.  She had just finished her neoadjuvant chemotherapy and at that time her oncologist was leaning towards bilateral mastectomies.  She is a daily smoker.  She is approximately D cup and would like to be a bit smaller after reconstruction.  Base width 12.5 cm.  Slightly larger on the left side.  Discussed need for drains postoperatively.   Grade 2 ptosis.  She was told that she is not the best candidate for nipple sparing mastectomy given her current degree of ptosis.  Job: N/A.  PMH Significant for: Left-sided breast cancer, bipolar, HTN, tobacco use disorder, chronic pain syndrome.   Past Medical History: Allergies: Allergies  Allergen Reactions   Tramadol Itching   Hydrocodone-Acetaminophen Hives   Lactose Intolerance (Gi) Diarrhea    Current Medications:  Current Outpatient Medications:    acetaminophen (TYLENOL) 500 MG tablet, Take 1,000 mg by mouth every 6 (six) hours as needed (pain)., Disp: , Rfl:    gabapentin (NEURONTIN) 100 MG capsule, Take 1 capsule (100 mg total) by mouth 2 (two) times daily. (Patient not taking: Reported on 05/17/2021), Disp: 60 capsule, Rfl: 0   lidocaine (XYLOCAINE) 2 % solution, Use as directed 15 mLs in the mouth or throat every 3 (three) hours as needed for mouth pain. (Patient not taking: Reported on 05/17/2021), Disp: 100 mL, Rfl: 0   lidocaine-prilocaine (EMLA) cream, Apply to affected area once (Patient not taking: Reported on 05/17/2021), Disp: 30 g, Rfl: 3   lisinopril (ZESTRIL) 10 MG tablet, Take 10 mg by mouth daily., Disp: , Rfl:    ondansetron (ZOFRAN) 8 MG tablet, Take 1 tablet (8 mg total) by mouth 2 (two) times daily as needed (Nausea or vomiting). Start on the third day after chemotherapy. (Patient not  taking: Reported on 05/17/2021), Disp: 30 tablet, Rfl: 1   oxyCODONE (ROXICODONE) 15 MG immediate release tablet, Take 1 tablet (15 mg total) by mouth every 8 (eight) hours as needed for pain., Disp: 90 tablet, Rfl: 0   prochlorperazine (COMPAZINE) 10 MG tablet, Take 1 tablet (10 mg total) by mouth every 6 (six) hours as needed (Nausea or vomiting). (Patient not taking: Reported on 05/17/2021), Disp: 30 tablet, Rfl: 1   QUEtiapine (SEROQUEL) 100 MG tablet, Take 1 tablet (100 mg total) by mouth at bedtime. (Patient taking differently: Take 100 mg by mouth daily as needed (to  stabilize moods).), Disp: 30 tablet, Rfl: 3   zolpidem (AMBIEN) 5 MG tablet, Take 1 tablet (5 mg total) by mouth at bedtime as needed. (Patient taking differently: Take 5 mg by mouth at bedtime as needed for sleep.), Disp: 30 tablet, Rfl: 3  Past Medical Problems: Past Medical History:  Diagnosis Date   Abnormal Pap smear of cervix    Anemia    Anxiety    Arthritis    Bipolar disorder ([place])    diagnosed years ago   Cancer ([place])    Chlamydia    Depression    Family history of breast cancer 01/18/2021   GDM (gestational diabetes mellitus)    GERD (gastroesophageal reflux disease)    pt used to take omeprazole, doesn't have much issues with it anymore   Gonorrhea    Herpes genitalia    History of hiatal hernia    Migraines    Ovarian cyst    Polycystic kidney disease    Preeclampsia    Pregnancy induced hypertension     Past Surgical History: Past Surgical History:  Procedure Laterality Date   COLPOSCOPY     DILATION AND CURETTAGE OF UTERUS     PORT A CATH REVISION Right 01/31/2021   Procedure: PORT A CATH REVISION;  Surgeon: Rolm Bookbinder, MD;  Location: [facility];  Service: General;  Laterality: Right;   PORTACATH PLACEMENT N/A 01/06/2021   Procedure: INSERTION PORT-A-CATH;  Surgeon: Rolm Bookbinder, MD;  Location: [facility];  Service: General;  Laterality: N/A;   TONSILLECTOMY     TUBAL LIGATION Bilateral 09/24/2016   Procedure: POST PARTUM TUBAL LIGATION;  Surgeon: Lavonia Drafts, MD;  Location: [facility];  Service: Gynecology;  Laterality: Bilateral;    Social History: Social History   Socioeconomic History   Marital status: Single    Spouse name: Not on file   Number of children: Not on file   Years of education: Not on file   Highest education level: Not on file  Occupational History   Not on file  Tobacco Use   Smoking status: Every Day    Packs/day: 0.25    Types: Cigarettes   Smokeless tobacco: Never   Tobacco  comments:    Wants to restart Nicotine  Vaping Use   Vaping Use: Never used  Substance and Sexual Activity   Alcohol use: Not Currently    Alcohol/week: 1.0 standard drink    Types: 1 Glasses of wine per week   Drug use: Not Currently   Sexual activity: Yes    Birth control/protection: None, Surgical    Comment: Tubal  Other Topics Concern   Not on file  Social History Narrative   Not on file   Social Determinants of Health   Financial Resource Strain: Not on file  Food Insecurity: Not on file  Transportation Needs: Not on file  Physical Activity: Not on  file  Stress: Not on file  Social Connections: Not on file  Intimate Partner Violence: Not on file    Family History: Family History  Problem Relation Age of Onset   Kidney disease Mother    Breast cancer Maternal Aunt        dx 49s   Breast cancer Other        MGM's sister and MGM's niece; dx after 9   Other Neg Hx     Review of Systems: ROS Denies recent fevers, chills, infection or illness, traumas, chest pain, or shortness of breath.  Physical Exam: Vital Signs There were no vitals taken for this visit.  Physical Exam Constitutional:      General: Not in acute distress.    Appearance: Normal appearance. Not ill-appearing.  HENT:     Head: Normocephalic and atraumatic.  Eyes:     Pupils: Pupils are equal, round. Cardiovascular:     Rate and Rhythm: Normal rate.    Pulses: Normal pulses.  Pulmonary:     Effort: No respiratory distress or increased work of breathing.  Speaks in full sentences. Abdominal:     General: Abdomen is flat. No distension.   Musculoskeletal: Normal range of motion. No lower extremity swelling or edema. No varicosities. Skin:    General: Skin is warm and dry.     Findings: No erythema or rash.  Neurological:     Mental Status: Alert and oriented to person, place, and time.  Psychiatric:        Mood and Affect: Mood normal.        Behavior: Behavior normal.     Assessment/Plan: The patient is scheduled for bilateral mastectomy with immediate reconstruction using tissue expander and Flex HD 05/23/2021 with Dr. Claudia Desanctis.  Risks, benefits, and alternatives of procedure discussed, questions answered and consent obtained.    Smoking Status: Current half a pack per day smoker; Counseling Given?  Counseled on the risks of poor wound healing in the setting of nicotine use.  She tells me that she plans to stop a couple of days before surgery and she will not use any nicotine replacement products. Last Mammogram: 05/2021; Results: BI-RADS Category 6: Known biopsy-proven left-sided breast malignancy.  Caprini Score: 5; Risk Factors include: Age, BMI greater than 25, breast cancer, and length of planned surgery. Recommendation for mechanical prophylaxis. Encourage early ambulation.   Pictures obtained: Today.  Post-op Rx sent to pharmacy: Zofran, Bactrim.  Patient was provided with the General Surgical Risk consent document and Pain Medication Agreement prior to their appointment.  They had adequate time to read through the risk consent documents and Pain Medication Agreement. We also discussed them in person together during this preop appointment. All of their questions were answered to their satisfaction.  Recommended calling if they have any further questions.  Risk consent form and Pain Medication Agreement to be scanned into patient's chart.  The risks that can be encountered with and after placement of a breast expander placement were discussed and include the following but not limited to these: bleeding, infection, delayed healing, anesthesia risks, skin sensation changes, injury to structures including nerves, blood vessels, and muscles which may be temporary or permanent, allergies to tape, suture materials and glues, blood products, topical preparations or injected agents, skin contour irregularities, skin discoloration and swelling, deep vein thrombosis,  cardiac and pulmonary complications, pain, which may persist, fluid accumulation, wrinkling of the skin over the expander, changes in nipple or breast sensation, expander leakage or rupture,  faulty position of the expander, persistent pain, formation of tight scar tissue around the expander (capsular contracture), possible need for revisional surgery or staged procedures.   Electronically signed by: Krista Blue, PA-C 05/19/2021 1:55 PM

## 2021-05-20 ENCOUNTER — Other Ambulatory Visit: Payer: Self-pay | Admitting: General Surgery

## 2021-05-20 ENCOUNTER — Ambulatory Visit
Admission: RE | Admit: 2021-05-20 | Discharge: 2021-05-20 | Disposition: A | Discharge status: Home / Self Care | Payer: Medicaid Other | Source: Ambulatory Visit | Attending: General Surgery | Admitting: General Surgery

## 2021-05-20 DIAGNOSIS — C50412 Malignant neoplasm of upper-outer quadrant of left female breast: Secondary | ICD-10-CM

## 2021-05-20 DIAGNOSIS — C50912 Malignant neoplasm of unspecified site of left female breast: Secondary | ICD-10-CM | POA: Diagnosis not present

## 2021-05-20 DIAGNOSIS — R590 Localized enlarged lymph nodes: Secondary | ICD-10-CM | POA: Diagnosis not present

## 2021-05-20 DIAGNOSIS — Z170 Estrogen receptor positive status [ER+]: Secondary | ICD-10-CM

## 2021-05-20 DIAGNOSIS — Z9011 Acquired absence of right breast and nipple: Secondary | ICD-10-CM | POA: Diagnosis not present

## 2021-05-23 ENCOUNTER — Other Ambulatory Visit: Payer: Self-pay

## 2021-05-23 ENCOUNTER — Telehealth: Payer: Self-pay

## 2021-05-23 ENCOUNTER — Ambulatory Visit (HOSPITAL_COMMUNITY): Payer: Medicaid Other | Admitting: Anesthesiology

## 2021-05-23 ENCOUNTER — Observation Stay (HOSPITAL_COMMUNITY)
Admission: RE | Admit: 2021-05-23 | Discharge: 2021-05-24 | Disposition: A | Discharge status: Home / Self Care | Payer: Medicaid Other | Attending: Plastic Surgery | Admitting: Plastic Surgery

## 2021-05-23 ENCOUNTER — Encounter (HOSPITAL_COMMUNITY)
Admission: RE | Disposition: A | Discharge status: Home / Self Care | Payer: Self-pay | Source: Home / Self Care | Attending: Plastic Surgery

## 2021-05-23 ENCOUNTER — Encounter (HOSPITAL_COMMUNITY): Payer: Self-pay | Admitting: General Surgery

## 2021-05-23 ENCOUNTER — Ambulatory Visit (HOSPITAL_COMMUNITY): Payer: Medicaid Other | Admitting: Physician Assistant

## 2021-05-23 ENCOUNTER — Ambulatory Visit
Admission: RE | Admit: 2021-05-23 | Discharge: 2021-05-23 | Disposition: A | Discharge status: Home / Self Care | Payer: Medicaid Other | Source: Ambulatory Visit | Attending: General Surgery | Admitting: General Surgery

## 2021-05-23 DIAGNOSIS — Z9221 Personal history of antineoplastic chemotherapy: Secondary | ICD-10-CM | POA: Insufficient documentation

## 2021-05-23 DIAGNOSIS — G8918 Other acute postprocedural pain: Secondary | ICD-10-CM | POA: Diagnosis not present

## 2021-05-23 DIAGNOSIS — Z170 Estrogen receptor positive status [ER+]: Secondary | ICD-10-CM | POA: Insufficient documentation

## 2021-05-23 DIAGNOSIS — N6011 Diffuse cystic mastopathy of right breast: Secondary | ICD-10-CM | POA: Diagnosis not present

## 2021-05-23 DIAGNOSIS — Z79899 Other long term (current) drug therapy: Secondary | ICD-10-CM | POA: Insufficient documentation

## 2021-05-23 DIAGNOSIS — C773 Secondary and unspecified malignant neoplasm of axilla and upper limb lymph nodes: Secondary | ICD-10-CM | POA: Diagnosis not present

## 2021-05-23 DIAGNOSIS — C50412 Malignant neoplasm of upper-outer quadrant of left female breast: Principal | ICD-10-CM | POA: Insufficient documentation

## 2021-05-23 DIAGNOSIS — N641 Fat necrosis of breast: Secondary | ICD-10-CM | POA: Diagnosis not present

## 2021-05-23 DIAGNOSIS — Z9889 Other specified postprocedural states: Secondary | ICD-10-CM

## 2021-05-23 DIAGNOSIS — Z803 Family history of malignant neoplasm of breast: Secondary | ICD-10-CM | POA: Insufficient documentation

## 2021-05-23 DIAGNOSIS — C50912 Malignant neoplasm of unspecified site of left female breast: Secondary | ICD-10-CM | POA: Diagnosis not present

## 2021-05-23 DIAGNOSIS — C801 Malignant (primary) neoplasm, unspecified: Secondary | ICD-10-CM | POA: Diagnosis not present

## 2021-05-23 HISTORY — PX: TOTAL MASTECTOMY: SHX6129

## 2021-05-23 HISTORY — PX: RADIOACTIVE SEED GUIDED AXILLARY SENTINEL LYMPH NODE: SHX6735

## 2021-05-23 HISTORY — PX: MASTECTOMY W/ SENTINEL NODE BIOPSY: SHX2001

## 2021-05-23 HISTORY — PX: BREAST RECONSTRUCTION WITH PLACEMENT OF TISSUE EXPANDER AND FLEX HD (ACELLULAR HYDRATED DERMIS): SHX6295

## 2021-05-23 SURGERY — MASTECTOMY WITH SENTINEL LYMPH NODE BIOPSY
Anesthesia: General | Site: Breast | Laterality: Right

## 2021-05-23 MED ORDER — KETOROLAC TROMETHAMINE 15 MG/ML IJ SOLN
15.0000 mg | Freq: Once | INTRAMUSCULAR | Status: AC
  Administered 2021-05-23: 15 mg via INTRAVENOUS
  Filled 2021-05-23: qty 1

## 2021-05-23 MED ORDER — BUPIVACAINE HCL (PF) 0.25 % IJ SOLN
INTRAMUSCULAR | Status: DC | PRN
  Administered 2021-05-23 (×2): 15 mL via PERINEURAL

## 2021-05-23 MED ORDER — ONDANSETRON HCL 4 MG/2ML IJ SOLN
INTRAMUSCULAR | Status: DC | PRN
  Administered 2021-05-23: 4 mg via INTRAVENOUS

## 2021-05-23 MED ORDER — CHLORHEXIDINE GLUCONATE CLOTH 2 % EX PADS: 6.0000 | MEDICATED_PAD | Freq: Once | CUTANEOUS | Status: DC

## 2021-05-23 MED ORDER — OXYCODONE HCL 5 MG PO TABS: 15.0000 mg | ORAL_TABLET | Freq: Three times a day (TID) | ORAL | Status: DC | PRN

## 2021-05-23 MED ORDER — CHLORHEXIDINE GLUCONATE 0.12 % MT SOLN
15.0000 mL | Freq: Once | OROMUCOSAL | Status: AC
  Administered 2021-05-23: 15 mL via OROMUCOSAL
  Filled 2021-05-23: qty 15

## 2021-05-23 MED ORDER — HEMOSTATIC AGENTS (NO CHARGE) OPTIME
TOPICAL | Status: DC | PRN
  Administered 2021-05-23: 1 via TOPICAL

## 2021-05-23 MED ORDER — ROCURONIUM BROMIDE 10 MG/ML (PF) SYRINGE
PREFILLED_SYRINGE | INTRAVENOUS | Status: DC | PRN
  Administered 2021-05-23: 40 mg via INTRAVENOUS
  Administered 2021-05-23: 70 mg via INTRAVENOUS
  Administered 2021-05-23: 30 mg via INTRAVENOUS
  Administered 2021-05-23: 10 mg via INTRAVENOUS
  Administered 2021-05-23: 30 mg via INTRAVENOUS

## 2021-05-23 MED ORDER — ROCURONIUM BROMIDE 10 MG/ML (PF) SYRINGE
PREFILLED_SYRINGE | INTRAVENOUS | Status: AC
  Filled 2021-05-23: qty 20

## 2021-05-23 MED ORDER — ONDANSETRON HCL 4 MG/2ML IJ SOLN
INTRAMUSCULAR | Status: AC
  Filled 2021-05-23: qty 4

## 2021-05-23 MED ORDER — BUPIVACAINE LIPOSOME 1.3 % IJ SUSP
INTRAMUSCULAR | Status: DC | PRN
Start: 2021-05-23 — End: 2021-05-23
  Administered 2021-05-23 (×2): 10 mL via PERINEURAL

## 2021-05-23 MED ORDER — ACETAMINOPHEN 500 MG PO TABS
1000.0000 mg | ORAL_TABLET | ORAL | Status: AC
  Administered 2021-05-23: 1000 mg via ORAL
  Filled 2021-05-23: qty 2

## 2021-05-23 MED ORDER — OXYCODONE HCL 5 MG PO TABS: 5.0000 mg | ORAL_TABLET | Freq: Once | ORAL | Status: DC | PRN

## 2021-05-23 MED ORDER — DIPHENHYDRAMINE HCL 50 MG/ML IJ SOLN: 25.0000 mg | Freq: Four times a day (QID) | INTRAMUSCULAR | Status: DC | PRN

## 2021-05-23 MED ORDER — MIDAZOLAM HCL 2 MG/2ML IJ SOLN
2.0000 mg | Freq: Once | INTRAMUSCULAR | Status: AC
  Filled 2021-05-23: qty 2

## 2021-05-23 MED ORDER — SCOPOLAMINE 1 MG/3DAYS TD PT72
MEDICATED_PATCH | TRANSDERMAL | Status: AC
  Filled 2021-05-23: qty 1

## 2021-05-23 MED ORDER — OXYCODONE HCL 5 MG/5ML PO SOLN: 5.0000 mg | Freq: Once | ORAL | Status: DC | PRN

## 2021-05-23 MED ORDER — PROPOFOL 10 MG/ML IV BOLUS
INTRAVENOUS | Status: AC
  Filled 2021-05-23: qty 20

## 2021-05-23 MED ORDER — DIPHENHYDRAMINE HCL 25 MG PO CAPS
25.0000 mg | ORAL_CAPSULE | Freq: Four times a day (QID) | ORAL | Status: DC | PRN
  Administered 2021-05-24: 25 mg via ORAL
  Filled 2021-05-23: qty 1

## 2021-05-23 MED ORDER — BUPIVACAINE LIPOSOME 1.3 % IJ SUSP: INTRAMUSCULAR | Status: DC | PRN

## 2021-05-23 MED ORDER — DEXAMETHASONE SODIUM PHOSPHATE 10 MG/ML IJ SOLN
INTRAMUSCULAR | Status: AC
  Filled 2021-05-23: qty 1

## 2021-05-23 MED ORDER — HYDROMORPHONE HCL 1 MG/ML IJ SOLN
INTRAMUSCULAR | Status: DC | PRN
  Administered 2021-05-23 (×2): .5 mg via INTRAVENOUS

## 2021-05-23 MED ORDER — ACETAMINOPHEN 500 MG PO TABS
1000.0000 mg | ORAL_TABLET | Freq: Four times a day (QID) | ORAL | Status: DC
  Administered 2021-05-23 – 2021-05-24 (×4): 1000 mg via ORAL
  Filled 2021-05-23 (×4): qty 2

## 2021-05-23 MED ORDER — SODIUM CHLORIDE 0.9 % IV SOLN
Freq: Once | INTRAVENOUS | Status: AC
  Administered 2021-05-23: 500 mL
  Filled 2021-05-23: qty 10

## 2021-05-23 MED ORDER — AMISULPRIDE (ANTIEMETIC) 5 MG/2ML IV SOLN: 10.0000 mg | Freq: Once | INTRAVENOUS | Status: DC | PRN

## 2021-05-23 MED ORDER — POLYETHYLENE GLYCOL 3350 17 G PO PACK: 17.0000 g | PACK | Freq: Every day | ORAL | Status: DC | PRN

## 2021-05-23 MED ORDER — SUGAMMADEX SODIUM 200 MG/2ML IV SOLN
INTRAVENOUS | Status: DC | PRN
  Administered 2021-05-23: 200 mg via INTRAVENOUS

## 2021-05-23 MED ORDER — FENTANYL CITRATE (PF) 100 MCG/2ML IJ SOLN: 100.0000 ug | Freq: Once | INTRAMUSCULAR | Status: AC

## 2021-05-23 MED ORDER — METHOCARBAMOL 1000 MG/10ML IJ SOLN
500.0000 mg | Freq: Three times a day (TID) | INTRAVENOUS | Status: DC
  Administered 2021-05-23 – 2021-05-24 (×3): 500 mg via INTRAVENOUS
  Filled 2021-05-23: qty 500
  Filled 2021-05-23 (×2): qty 5

## 2021-05-23 MED ORDER — INDOCYANINE GREEN 25 MG IV SOLR
INTRAVENOUS | Status: DC | PRN
  Administered 2021-05-23: 7.5 mg via INTRAVENOUS

## 2021-05-23 MED ORDER — ONDANSETRON 4 MG PO TBDP: 4.0000 mg | ORAL_TABLET | Freq: Four times a day (QID) | ORAL | Status: DC | PRN

## 2021-05-23 MED ORDER — 0.9 % SODIUM CHLORIDE (POUR BTL) OPTIME
TOPICAL | Status: DC | PRN
  Administered 2021-05-23 (×2): 1000 mL

## 2021-05-23 MED ORDER — SCOPOLAMINE 1 MG/3DAYS TD PT72
MEDICATED_PATCH | TRANSDERMAL | Status: DC | PRN
  Administered 2021-05-23: 1 via TRANSDERMAL

## 2021-05-23 MED ORDER — FENTANYL CITRATE (PF) 100 MCG/2ML IJ SOLN
INTRAMUSCULAR | Status: AC
  Administered 2021-05-23: 100 ug via INTRAVENOUS
  Filled 2021-05-23: qty 2

## 2021-05-23 MED ORDER — CELECOXIB 200 MG PO CAPS
400.0000 mg | ORAL_CAPSULE | ORAL | Status: AC
  Administered 2021-05-23: 400 mg via ORAL
  Filled 2021-05-23: qty 2

## 2021-05-23 MED ORDER — PROCHLORPERAZINE MALEATE 10 MG PO TABS
10.0000 mg | ORAL_TABLET | Freq: Four times a day (QID) | ORAL | Status: DC | PRN
  Filled 2021-05-23: qty 1

## 2021-05-23 MED ORDER — ONDANSETRON HCL 4 MG/2ML IJ SOLN: 4.0000 mg | Freq: Once | INTRAMUSCULAR | Status: DC | PRN

## 2021-05-23 MED ORDER — HYDROMORPHONE HCL 1 MG/ML IJ SOLN
INTRAMUSCULAR | Status: AC
  Filled 2021-05-23: qty 1

## 2021-05-23 MED ORDER — MORPHINE SULFATE (PF) 2 MG/ML IV SOLN
2.0000 mg | INTRAVENOUS | Status: DC | PRN
  Administered 2021-05-23: 2 mg via INTRAVENOUS
  Filled 2021-05-23: qty 1

## 2021-05-23 MED ORDER — PROPOFOL 10 MG/ML IV BOLUS
INTRAVENOUS | Status: DC | PRN
  Administered 2021-05-23: 10 mg via INTRAVENOUS
  Administered 2021-05-23: 20 mg via INTRAVENOUS
  Administered 2021-05-23: 180 mg via INTRAVENOUS
  Administered 2021-05-23: 20 mg via INTRAVENOUS

## 2021-05-23 MED ORDER — LISINOPRIL 10 MG PO TABS
10.0000 mg | ORAL_TABLET | Freq: Every day | ORAL | Status: DC
  Administered 2021-05-23: 10 mg via ORAL
  Filled 2021-05-23 (×2): qty 1

## 2021-05-23 MED ORDER — PROPOFOL 500 MG/50ML IV EMUL
INTRAVENOUS | Status: DC | PRN
  Administered 2021-05-23: 150 ug/kg/min via INTRAVENOUS

## 2021-05-23 MED ORDER — ORAL CARE MOUTH RINSE: 15.0000 mL | Freq: Once | OROMUCOSAL | Status: AC

## 2021-05-23 MED ORDER — PROCHLORPERAZINE EDISYLATE 10 MG/2ML IJ SOLN: 5.0000 mg | Freq: Four times a day (QID) | INTRAMUSCULAR | Status: DC | PRN

## 2021-05-23 MED ORDER — LIDOCAINE 2% (20 MG/ML) 5 ML SYRINGE
INTRAMUSCULAR | Status: AC
  Filled 2021-05-23: qty 5

## 2021-05-23 MED ORDER — LIDOCAINE 2% (20 MG/ML) 5 ML SYRINGE
INTRAMUSCULAR | Status: DC | PRN
  Administered 2021-05-23: 80 mg via INTRAVENOUS

## 2021-05-23 MED ORDER — CELECOXIB 200 MG PO CAPS
ORAL_CAPSULE | ORAL | Status: AC
  Filled 2021-05-23: qty 1

## 2021-05-23 MED ORDER — DEXMEDETOMIDINE (PRECEDEX) IN NS 20 MCG/5ML (4 MCG/ML) IV SYRINGE
PREFILLED_SYRINGE | INTRAVENOUS | Status: DC | PRN
  Administered 2021-05-23 (×2): 8 ug via INTRAVENOUS
  Administered 2021-05-23: 4 ug via INTRAVENOUS

## 2021-05-23 MED ORDER — ACETAMINOPHEN 325 MG PO TABS: 650.0000 mg | ORAL_TABLET | Freq: Four times a day (QID) | ORAL | Status: DC | PRN

## 2021-05-23 MED ORDER — MORPHINE SULFATE (PF) 4 MG/ML IV SOLN
4.0000 mg | INTRAVENOUS | Status: DC | PRN
  Administered 2021-05-23 – 2021-05-24 (×2): 4 mg via INTRAVENOUS
  Filled 2021-05-23 (×2): qty 1

## 2021-05-23 MED ORDER — HYDROMORPHONE HCL 1 MG/ML IJ SOLN
INTRAMUSCULAR | Status: AC
  Filled 2021-05-23: qty 0.5

## 2021-05-23 MED ORDER — PHENYLEPHRINE HCL-NACL 20-0.9 MG/250ML-% IV SOLN
INTRAVENOUS | Status: DC | PRN
  Administered 2021-05-23: 30 ug/min via INTRAVENOUS

## 2021-05-23 MED ORDER — MORPHINE SULFATE (PF) 2 MG/ML IV SOLN: 2.0000 mg | INTRAVENOUS | Status: DC | PRN

## 2021-05-23 MED ORDER — METHYLENE BLUE 0.5 % INJ SOLN
INTRAVENOUS | Status: AC
  Filled 2021-05-23: qty 10

## 2021-05-23 MED ORDER — ACETAMINOPHEN 650 MG RE SUPP: 650.0000 mg | Freq: Four times a day (QID) | RECTAL | Status: DC | PRN

## 2021-05-23 MED ORDER — FENTANYL CITRATE (PF) 250 MCG/5ML IJ SOLN
INTRAMUSCULAR | Status: DC | PRN
Start: 2021-05-23 — End: 2021-05-23
  Administered 2021-05-23 (×2): 50 ug via INTRAVENOUS
  Administered 2021-05-23: 150 ug via INTRAVENOUS

## 2021-05-23 MED ORDER — LACTATED RINGERS IV SOLN: INTRAVENOUS | Status: DC

## 2021-05-23 MED ORDER — HYDROMORPHONE HCL 1 MG/ML IJ SOLN
0.2500 mg | INTRAMUSCULAR | Status: DC | PRN
  Administered 2021-05-23 (×2): 0.5 mg via INTRAVENOUS

## 2021-05-23 MED ORDER — ONDANSETRON HCL 4 MG/2ML IJ SOLN: 4.0000 mg | Freq: Four times a day (QID) | INTRAMUSCULAR | Status: DC | PRN

## 2021-05-23 MED ORDER — CEFAZOLIN SODIUM-DEXTROSE 2-4 GM/100ML-% IV SOLN
2.0000 g | INTRAVENOUS | Status: AC
  Administered 2021-05-23: 2 g via INTRAVENOUS
  Filled 2021-05-23: qty 100

## 2021-05-23 MED ORDER — DEXAMETHASONE SODIUM PHOSPHATE 10 MG/ML IJ SOLN
INTRAMUSCULAR | Status: DC | PRN
  Administered 2021-05-23: 10 mg via INTRAVENOUS

## 2021-05-23 MED ORDER — SULFAMETHOXAZOLE-TRIMETHOPRIM 800-160 MG PO TABS
1.0000 | ORAL_TABLET | Freq: Two times a day (BID) | ORAL | Status: DC
  Administered 2021-05-23 – 2021-05-24 (×2): 1 via ORAL
  Filled 2021-05-23 (×2): qty 1

## 2021-05-23 MED ORDER — OXYCODONE HCL 5 MG PO TABS
15.0000 mg | ORAL_TABLET | ORAL | Status: DC | PRN
  Administered 2021-05-23 – 2021-05-24 (×4): 15 mg via ORAL
  Filled 2021-05-23 (×5): qty 3

## 2021-05-23 MED ORDER — MIDAZOLAM HCL 2 MG/2ML IJ SOLN
INTRAMUSCULAR | Status: AC
  Filled 2021-05-23: qty 2

## 2021-05-23 MED ORDER — FENTANYL CITRATE (PF) 250 MCG/5ML IJ SOLN
INTRAMUSCULAR | Status: AC
  Filled 2021-05-23: qty 5

## 2021-05-23 MED ORDER — CEFAZOLIN SODIUM-DEXTROSE 2-4 GM/100ML-% IV SOLN: 2.0000 g | INTRAVENOUS | Status: DC

## 2021-05-23 MED ORDER — MIDAZOLAM HCL 2 MG/2ML IJ SOLN
INTRAMUSCULAR | Status: AC
  Administered 2021-05-23: 2 mg via INTRAVENOUS
  Filled 2021-05-23: qty 2

## 2021-05-23 SURGICAL SUPPLY — 116 items
ADH SKN CLS APL DERMABOND .7 (GAUZE/BANDAGES/DRESSINGS) ×3
APL PRP STRL LF DISP 70% ISPRP (MISCELLANEOUS) ×6
APL SKNCLS STERI-STRIP NONHPOA (GAUZE/BANDAGES/DRESSINGS) ×6
APPLIER CLIP 9.375 MED OPEN (MISCELLANEOUS) ×10
APR CLP MED 9.3 20 MLT OPN (MISCELLANEOUS) ×6
BAG COUNTER SPONGE SURGICOUNT (BAG) ×5 IMPLANT
BAG DECANTER FOR FLEXI CONT (MISCELLANEOUS) ×6 IMPLANT
BAG SPNG CNTER NS LX DISP (BAG) ×3
BAG SURGICOUNT SPONGE COUNTING (BAG) ×1
BENZOIN TINCTURE PRP APPL 2/3 (GAUZE/BANDAGES/DRESSINGS) ×12 IMPLANT
BINDER BREAST LRG (GAUZE/BANDAGES/DRESSINGS) ×2 IMPLANT
BINDER BREAST XLRG (GAUZE/BANDAGES/DRESSINGS) IMPLANT
BIOPATCH RED 1 DISK 7.0 (GAUZE/BANDAGES/DRESSINGS) ×5 IMPLANT
BIOPATCH RED 1IN DISK 7.0MM (GAUZE/BANDAGES/DRESSINGS) ×1
BLADE SURG 15 STRL LF DISP TIS (BLADE) IMPLANT
BLADE SURG 15 STRL SS (BLADE)
BNDG ELASTIC 6X5.8 VLCR STR LF (GAUZE/BANDAGES/DRESSINGS) ×6 IMPLANT
BNDG GAUZE ELAST 4 BULKY (GAUZE/BANDAGES/DRESSINGS) IMPLANT
CANISTER SUCT 3000ML PPV (MISCELLANEOUS) ×6 IMPLANT
CHLORAPREP W/TINT 26 (MISCELLANEOUS) ×12 IMPLANT
CLIP APPLIE 9.375 MED OPEN (MISCELLANEOUS) ×4 IMPLANT
CLOSURE WOUND 1/2 X4 (GAUZE/BANDAGES/DRESSINGS) ×2
CNTNR URN SCR LID CUP LEK RST (MISCELLANEOUS) ×4 IMPLANT
CONT SPEC 4OZ STRL OR WHT (MISCELLANEOUS) ×5
COVER PROBE W GEL 5X96 (DRAPES) ×6 IMPLANT
COVER SURGICAL LIGHT HANDLE (MISCELLANEOUS) ×6 IMPLANT
DECANTER SPIKE VIAL GLASS SM (MISCELLANEOUS) IMPLANT
DERMABOND ADVANCED (GAUZE/BANDAGES/DRESSINGS) ×2
DERMABOND ADVANCED .7 DNX12 (GAUZE/BANDAGES/DRESSINGS) ×4 IMPLANT
DRAIN CHANNEL 15F RND FF W/TCR (WOUND CARE) ×6 IMPLANT
DRAIN CHANNEL 19F RND (DRAIN) ×6 IMPLANT
DRAIN JP 15F RND TROCAR (DRAIN) ×6 IMPLANT
DRAPE HALF SHEET 70X43 (DRAPES) ×12 IMPLANT
DRAPE ORTHO SPLIT 77X108 STRL (DRAPES) ×10
DRAPE SURG ORHT 6 SPLT 77X108 (DRAPES) ×8 IMPLANT
DRAPE TOP ARMCOVERS (MISCELLANEOUS) ×6 IMPLANT
DRAPE U-SHAPE 76X120 STRL (DRAPES) ×6 IMPLANT
DRSG PAD ABDOMINAL 8X10 ST (GAUZE/BANDAGES/DRESSINGS) ×20 IMPLANT
DRSG TEGADERM 4X10 (GAUZE/BANDAGES/DRESSINGS) IMPLANT
DRSG TEGADERM 4X4.75 (GAUZE/BANDAGES/DRESSINGS) ×6 IMPLANT
ELECT BLADE 4.0 EZ CLEAN MEGAD (MISCELLANEOUS) ×5
ELECT CAUTERY BLADE 6.4 (BLADE) ×6 IMPLANT
ELECT COATED BLADE 2.86 ST (ELECTRODE) IMPLANT
ELECT REM PT RETURN 9FT ADLT (ELECTROSURGICAL) ×10
ELECTRODE BLDE 4.0 EZ CLN MEGD (MISCELLANEOUS) ×4 IMPLANT
ELECTRODE REM PT RTRN 9FT ADLT (ELECTROSURGICAL) ×8 IMPLANT
EVACUATOR SILICONE 100CC (DRAIN) ×14 IMPLANT
EXPANDER TISSUE CPX4 450CC (Expander) ×4 IMPLANT
GAUZE SPONGE 4X4 12PLY STRL (GAUZE/BANDAGES/DRESSINGS) ×6 IMPLANT
GLOVE SRG 8 PF TXTR STRL LF DI (GLOVE) IMPLANT
GLOVE SURG ENC MOIS LTX SZ7 (GLOVE) ×6 IMPLANT
GLOVE SURG ENC TEXT LTX SZ7 (GLOVE) ×6 IMPLANT
GLOVE SURG ENC TEXT LTX SZ7.5 (GLOVE) ×12 IMPLANT
GLOVE SURG UNDER POLY LF SZ7.5 (GLOVE) ×6 IMPLANT
GLOVE SURG UNDER POLY LF SZ8 (GLOVE)
GOWN STRL REUS W/ TWL LRG LVL3 (GOWN DISPOSABLE) ×20 IMPLANT
GOWN STRL REUS W/TWL LRG LVL3 (GOWN DISPOSABLE) ×25
GRAFT FLEX HD 19X22X0.7-1.4 (Tissue) ×4 IMPLANT
HEMOSTAT ARISTA ABSORB 3G PWDR (HEMOSTASIS) ×2 IMPLANT
KIT BASIN OR (CUSTOM PROCEDURE TRAY) ×12 IMPLANT
KIT FILL ASEPTIC TRANSFER (MISCELLANEOUS) ×2 IMPLANT
KIT FILL SYSTEM UNIVERSAL (SET/KITS/TRAYS/PACK) IMPLANT
KIT MARKER MARGIN INK (KITS) ×2 IMPLANT
KIT TURNOVER KIT B (KITS) ×6 IMPLANT
LIGHT WAVEGUIDE WIDE FLAT (MISCELLANEOUS) ×2 IMPLANT
MARKER SKIN DUAL TIP RULER LAB (MISCELLANEOUS) ×6 IMPLANT
NDL 18GX1X1/2 (RX/OR ONLY) (NEEDLE) IMPLANT
NDL 21 GA WING INFUSION (NEEDLE) IMPLANT
NDL FILTER BLUNT 18X1 1/2 (NEEDLE) IMPLANT
NDL HYPO 25GX1X1/2 BEV (NEEDLE) IMPLANT
NDL HYPO 25X1 1.5 SAFETY (NEEDLE) IMPLANT
NEEDLE 18GX1X1/2 (RX/OR ONLY) (NEEDLE) IMPLANT
NEEDLE 21 GA WING INFUSION (NEEDLE) ×5 IMPLANT
NEEDLE FILTER BLUNT 18X 1/2SAF (NEEDLE)
NEEDLE FILTER BLUNT 18X1 1/2 (NEEDLE) IMPLANT
NEEDLE HYPO 25GX1X1/2 BEV (NEEDLE) IMPLANT
NEEDLE HYPO 25X1 1.5 SAFETY (NEEDLE) IMPLANT
NS IRRIG 1000ML POUR BTL (IV SOLUTION) ×6 IMPLANT
PACK GENERAL/GYN (CUSTOM PROCEDURE TRAY) ×12 IMPLANT
PACK SPY-PHI (KITS) ×6 IMPLANT
PAD ARMBOARD 7.5X6 YLW CONV (MISCELLANEOUS) ×6 IMPLANT
PENCIL SMOKE EVACUATOR (MISCELLANEOUS) ×12 IMPLANT
PIN SAFETY STERILE (MISCELLANEOUS) ×12 IMPLANT
RETRACTOR ONETRAX LX 135X30 (MISCELLANEOUS) IMPLANT
SLEEVE SCD COMPRESS KNEE MED (STOCKING) ×6 IMPLANT
SPECIMEN JAR X LARGE (MISCELLANEOUS) ×6 IMPLANT
SPONGE T-LAP 18X18 ~~LOC~~+RFID (SPONGE) ×12 IMPLANT
STAPLER INSORB 30 2030 C-SECTI (MISCELLANEOUS) ×4 IMPLANT
STAPLER VISISTAT 35W (STAPLE) ×6 IMPLANT
STRIP CLOSURE SKIN 1/2X4 (GAUZE/BANDAGES/DRESSINGS) ×10 IMPLANT
SUT ETHILON 2 0 FS 18 (SUTURE) ×6 IMPLANT
SUT ETHILON 3 0 PS 1 (SUTURE) ×6 IMPLANT
SUT MNCRL AB 4-0 PS2 18 (SUTURE) ×6 IMPLANT
SUT MON AB 3-0 SH 27 (SUTURE) ×5
SUT MON AB 3-0 SH27 (SUTURE) ×4 IMPLANT
SUT MON AB 4-0 PC3 18 (SUTURE) ×4 IMPLANT
SUT PDS AB 3-0 SH 27 (SUTURE) ×36 IMPLANT
SUT PLAIN 5 0 P 3 18 (SUTURE) IMPLANT
SUT SILK 2 0 SH (SUTURE) ×6 IMPLANT
SUT VIC AB 2-0 SH 18 (SUTURE) ×6 IMPLANT
SUT VIC AB 3-0 54X BRD REEL (SUTURE) ×4 IMPLANT
SUT VIC AB 3-0 BRD 54 (SUTURE)
SUT VIC AB 3-0 SH 18 (SUTURE) ×4 IMPLANT
SUT VIC AB 3-0 SH 8-18 (SUTURE) ×8 IMPLANT
SUT VLOC 180 0 24IN GS25 (SUTURE) ×4 IMPLANT
SUT VLOC 90 P-14 23 (SUTURE) ×8 IMPLANT
SYR 50ML LL SCALE MARK (SYRINGE) ×12 IMPLANT
SYR BULB IRRIG 60ML STRL (SYRINGE) ×6 IMPLANT
SYR CONTROL 10ML LL (SYRINGE) IMPLANT
TAPE MEASURE VINYL STERILE (MISCELLANEOUS) IMPLANT
TOWEL GREEN STERILE (TOWEL DISPOSABLE) ×6 IMPLANT
TOWEL GREEN STERILE FF (TOWEL DISPOSABLE) ×18 IMPLANT
TRAY FOLEY W/BAG SLVR 16FR (SET/KITS/TRAYS/PACK)
TRAY FOLEY W/BAG SLVR 16FR ST (SET/KITS/TRAYS/PACK) IMPLANT
TUBE CONNECTING 20'X1/4 (TUBING) ×1
TUBE CONNECTING 20X1/4 (TUBING) ×5 IMPLANT

## 2021-05-23 NOTE — H&P (Signed)
36 y.o. female who is seen today for breast cancer follow up I saw her initially in August. She has a family history and a maternal grandmother in her 65s, maternal aunt in her 11s, and her mom sister at age 25. genetics is negative. She underwent evaluation with mammogram that showed the density breast. She underwent mammogram and ultrasound. The ultrasound showed at the palpable site at 1:00 8 cm from the nipple a 2.1 cm mass. There is a second palpable area 2:00 7 cm from the nipple measuring 1.6 cm. At 1233 cm from the nipple there is a 2.5 cm mass. Left axilla shows 2 lymph nodes with increased cortical thickness and 1 with borderline increased cortical thickness. She has had biopsies of all 4 of these. 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 This is grade 3.80% er pos, pr pos 3%, her 2 pos, Ki 25%.  Initial MRI showed nl right breast, 3 abnormal left ax lymph nodes, left breast with 3 spiculated masses. These are all connected together and the total enhancement measures 4.7x7.4x5 cm. There is skin thickening. She has undergone primary chemotherapy.  she has done well with this and completed December 27th. Repeat MRI shows complete interval resolution of the breast masses and enhancement as well as normal nodes. She returns today to discuss surgery. She desires bilateral mastectomies and has seen Dr Claudia Desanctis recently.   Review of Systems: A complete review of systems was obtained from the patient. I have reviewed this information and discussed as appropriate with the patient. See HPI as well for other ROS.  Review of Systems  Constitutional: Positive for malaise/fatigue.  All other systems reviewed and are negative.   Medical History: History reviewed. No pertinent past medical history.  Patient Active Problem List  Diagnosis   Bipolar 1 disorder (CMS-HCC)   Malignant neoplasm of upper-outer quadrant of left breast in female, estrogen receptor positive (CMS-HCC)   History  reviewed. No pertinent surgical history.   Allergies  Allergen Reactions   Lactose Diarrhea   Current Outpatient Medications on File Prior to Visit  Medication Sig Dispense Refill   ARIPiprazole (ABILIFY) 2 MG tablet   azithromycin (ZITHROMAX) 500 MG tablet   BELBUCA 300 mcg buccal film   butalbital-acetaminophen-caffeine (FIORICET) 50-325-40 mg tablet   cetirizine (ZYRTEC) 10 MG tablet   ciprofloxacin HCl (CIPRO) 500 MG tablet   cyclobenzaprine (FLEXERIL) 10 MG tablet   dexAMETHasone (DECADRON) 4 MG tablet   DULoxetine (CYMBALTA) 60 MG DR capsule   ergocalciferol, vitamin D2, 1,250 mcg (50,000 unit) capsule   fluconazole (DIFLUCAN) 100 MG tablet   lidocaine-prilocaine (EMLA) cream   lisinopriL (ZESTRIL) 10 MG tablet   medroxyPROGESTERone (DEPO-PROVERA) 150 mg/mL IM syringe   ondansetron (ZOFRAN) 8 MG tablet   prochlorperazine (COMPAZINE) 10 MG tablet   zolpidem (AMBIEN) 5 MG tablet    History reviewed. No pertinent family history.   Social History   Tobacco Use  Smoking Status Every Day   Packs/day: 1.00   Types: Cigarettes  Smokeless Tobacco Never    Social History   Socioeconomic History   Marital status: Unknown  Tobacco Use   Smoking status: Every Day  Packs/day: 1.00  Types: Cigarettes   Smokeless tobacco: Never   Objective:   Vitals:  05/03/21 1155  BP: (!) 136/90  Pulse: 107  Temp: 36.3 C (97.4 F)  SpO2: 100%  Weight: 69.9 kg (154 lb)  Height: 157.5 cm (5\' 2" )   Body mass index is 28.17 kg/m.  Physical Exam Constitutional:  Appearance: Normal appearance.  Chest:  Breasts: Right: No inverted nipple, mass or nipple discharge.  Left: No inverted nipple, mass or nipple discharge.  Comments: Port in place Lymphadenopathy:  Upper Body:  Right upper body: No supraclavicular or axillary adenopathy.  Left upper body: No supraclavicular or axillary adenopathy.  Neurological:  Mental Status: She is alert.   Assessment and Plan:   Malignant  neoplasm of upper-outer quadrant of left breast in female, estrogen receptor positive (CMS-HCC)  Right risk reducing mastectomy, left skin sparing mastectomy, left TAD We discussed a sentinel lymph node biopsy combined with a seed guided node biopsy if possible of the prior pos node (TAD). We discussed risk of lymphedema postop, possible additional surgery. We discussed the options for treatment of the breast cancer which included lumpectomy versus a mastectomy. We discussed the performance of the lumpectomy with radioactive seed placement. We discussed mastectomy and the postoperative care for that as well. Mastectomy can be followed by reconstruction. The decision for lumpectomy vs mastectomy has no impact on decision for chemotherapy. We discussed that there is no difference in her survival whether she undergoes lumpectomy with radiation therapy or antiestrogen therapy versus a mastectomy. There is also no real difference between her recurrence in the breast She desires bilateral mastectomies. With negative genetics and her age I think it is reasonable although not mandatory. She is not a nsm candidate with ptosis so will plan for ssm and expander reconstruction.  We discussed the risks of operation including bleeding, infection, possible reoperation. She understands her further therapy will be based on what her stages at the time of her operation.

## 2021-05-23 NOTE — Op Note (Signed)
Operative Note   DATE OF OPERATION: 05/23/2021  SURGICAL DEPARTMENT: Plastic Surgery  PREOPERATIVE DIAGNOSES: Breast cancer  POSTOPERATIVE DIAGNOSES:  same  PROCEDURE: 1.  Bilateral breast reconstruction with tissue expander and acellular dermal matrix 2.  Indocyanine green angiography bilateral mastectomy flaps  SURGEON: Talmadge Coventry, MD  ASSISTANT: Krista Blue, PA The advanced practice practitioner (APP) assisted throughout the case.  The APP was essential in retraction and counter traction when needed to make the case progress smoothly.  This retraction and assistance made it possible to see the tissue planes for the procedure.  The assistance was needed for hemostasis, tissue re-approximation and closure of the incision site.   ANESTHESIA:  General.   COMPLICATIONS: None.   INDICATIONS FOR PROCEDURE:  The patient, Robin Horn is a 36 y.o. female born on 10-03-1985, is here for treatment of bilateral breast reconstruction MRN: [id number]  CONSENT:  Informed consent was obtained directly from the patient. Risks, benefits and alternatives were fully discussed. Specific risks including but not limited to bleeding, infection, hematoma, seroma, scarring, pain, contracture, asymmetry, wound healing problems, and need for further surgery were all discussed. The patient did have an ample opportunity to have questions answered to satisfaction.   DESCRIPTION OF PROCEDURE:  The patient was taken to the operating room. SCDs were placed and antibiotics were given.  General anesthesia was administered.  The patient's operative site was prepped and draped in a sterile fashion. A time out was performed and all information was confirmed to be correct.  Started by evaluating the mastectomy flaps.  Clinically they look to be in good condition and well perfused.  Indocyanine green angiography was performed revealing some subtle filling defects in the superior lateral aspect on the left side.   I elected to remove a rim of skin from the left side and excised anticipated dogears from the right side.  All the skin was sent with the specimen.  I then ensured meticulous hemostasis on both sides.  On the left side a number of axillary lymph nodes had to be removed and so I placed Arista by the axillary vein and an additional drain tunneling up into that area that was secured with 3-0 nylon.  70 French drains were then placed in each mastectomy pocket and secured with nylon sutures.  I then used a 0 V-Loc to tack down the lateral soft tissues on both sides to close off that aspect of the pocket.  I then brought the Flex HD pieces onto the field.  These were rinsed in saline and 3-0 PDS was run around the periphery.  Expanders were then brought onto the field.  All the air was removed and they were placed within the matrix with the pursestring tied down to secure it in position.  Suture tabs were brought out at 3, 6, 9:00.  They were then stored in their containers with antibiotic irrigation.  Stay sutures were then placed in the chest wall at 3, 6, 9:00 corresponding to the suture tabs.  Both pockets were irrigated with triple antibiotic solution.  Stay sutures were then run through the suture tabs and the expanders were inserted into the wound and the stay sutures tied down.  100 cc of saline was infiltrated into both expanders.  I elected to use Mentor CPX 4 medium height expanders with the prescribed fill volume of 450 cc.  Serial number on the right side [id number].  Serial number on the left side [id number].  At this point the skin was  closed with interrupted buried in sorb staples and a running 3 OV lock.  This gave a nice on table result.  Soft compressive dressing was applied.  The patient tolerated the procedure well.  There were no complications. The patient was allowed to wake from anesthesia, extubated and taken to the recovery room in satisfactory condition.

## 2021-05-23 NOTE — Anesthesia Procedure Notes (Signed)
Procedure Name: Intubation Date/Time: 05/23/2021 10:33 AM Performed by: Erick Colace, CRNA Pre-anesthesia Checklist: Patient identified, Emergency Drugs available, Suction available and Patient being monitored Patient Re-evaluated:Patient Re-evaluated prior to induction Oxygen Delivery Method: Circle system utilized Preoxygenation: Pre-oxygenation with 100% oxygen Induction Type: IV induction Ventilation: Mask ventilation without difficulty Laryngoscope Size: Mac and 3 Grade View: Grade I Tube type: Oral Tube size: 7.0 mm Number of attempts: 1 Airway Equipment and Method: Stylet and Oral airway Placement Confirmation: ETT inserted through vocal cords under direct vision, positive ETCO2 and breath sounds checked- equal and bilateral Secured at: 21 cm Tube secured with: Tape Dental Injury: Teeth and Oropharynx as per pre-operative assessment

## 2021-05-23 NOTE — Anesthesia Postprocedure Evaluation (Signed)
Anesthesia Post Note  Patient: Robin Horn  Procedure(s) Performed: LEFT MASTECTOMY WITH LEFT AXILLARY SENTINEL LYMPH NODE BIOPSY (Left: Breast) RADIOACTIVE SEED GUIDED LEFT AXILLARY SENTINEL LYMPH NODE EXCISION (Left: Breast) RIGHT TOTAL MASTECTOMY (Right: Breast) BREAST RECONSTRUCTION WITH PLACEMENT OF TISSUE EXPANDER AND FLEX HD (ACELLULAR HYDRATED DERMIS) (Bilateral)     Patient location during evaluation: PACU Anesthesia Type: General Level of consciousness: awake and alert and oriented Pain management: pain level controlled Vital Signs Assessment: post-procedure vital signs reviewed and stable Respiratory status: spontaneous breathing, nonlabored ventilation and respiratory function stable Cardiovascular status: blood pressure returned to baseline and stable Postop Assessment: no apparent nausea or vomiting Anesthetic complications: no   No notable events documented.  Last Vitals:  Vitals:   05/23/21 1415 05/23/21 1430  BP: (!) 154/99 (!) 147/96  Pulse: 81 68  Resp: (!) 25 17  Temp: 37.4 C   SpO2: 100% 100%    Last Pain:  Vitals:   05/23/21 1434  TempSrc:   PainSc: 7                  Haleigh Desmith A.

## 2021-05-23 NOTE — Transfer of Care (Signed)
Immediate Anesthesia Transfer of Care Note  Patient: Robin Horn  Procedure(s) Performed: LEFT MASTECTOMY WITH LEFT AXILLARY SENTINEL LYMPH NODE BIOPSY (Left: Breast) RADIOACTIVE SEED GUIDED LEFT AXILLARY SENTINEL LYMPH NODE EXCISION (Left: Breast) RIGHT TOTAL MASTECTOMY (Right: Breast) BREAST RECONSTRUCTION WITH PLACEMENT OF TISSUE EXPANDER AND FLEX HD (ACELLULAR HYDRATED DERMIS) (Bilateral)  Patient Location: PACU  Anesthesia Type:General  Level of Consciousness: awake  Airway & Oxygen Therapy: Patient Spontanous Breathing and Patient connected to face mask oxygen  Post-op Assessment: Report given to RN and Post -op Vital signs reviewed and stable  Post vital signs: Reviewed and stable  Last Vitals:  Vitals Value Taken Time  BP 155/87   Temp    Pulse 75 05/23/21 1413  Resp 24 05/23/21 1413  SpO2 100 % 05/23/21 1413  Vitals shown include unvalidated device data.  Last Pain:  Vitals:   05/23/21 0954  TempSrc:   PainSc: 0-No pain      Patients Stated Pain Goal: 1 (34/37/35 7897)  Complications: No notable events documented.

## 2021-05-23 NOTE — Anesthesia Preprocedure Evaluation (Signed)
Anesthesia Evaluation  Patient identified by MRN, date of birth, ID band Patient awake    Reviewed: Allergy & Precautions, NPO status , Patient's Chart, lab work & pertinent test results  Airway Mallampati: II  TM Distance: >3 FB Neck ROM: Full    Dental  (+) Poor Dentition, Dental Advisory Given   Pulmonary Current SmokerPatient did not abstain from smoking.,    Pulmonary exam normal breath sounds clear to auscultation       Cardiovascular hypertension, Pt. on medications Normal cardiovascular exam Rhythm:Regular Rate:Normal     Neuro/Psych  Headaches, PSYCHIATRIC DISORDERS Anxiety Depression Bipolar Disorder    GI/Hepatic Neg liver ROS, hiatal hernia, GERD  Medicated,  Endo/Other  diabetes, Gestational  Renal/GU Renal diseasePolycystic Ovaries  negative genitourinary   Musculoskeletal  (+) Arthritis , Osteoarthritis,    Abdominal   Peds  Hematology  (+) anemia , Thrombocytopenia- mild   Anesthesia Other Findings   Reproductive/Obstetrics                             Anesthesia Physical Anesthesia Plan  ASA: 2  Anesthesia Plan: General   Post-op Pain Management: Regional block   Induction: Intravenous  PONV Risk Score and Plan: 3 and Treatment may vary due to age or medical condition, Scopolamine patch - Pre-op, Ondansetron and Dexamethasone  Airway Management Planned: LMA and Oral ETT  Additional Equipment:   Intra-op Plan:   Post-operative Plan: Extubation in OR  Informed Consent: I have reviewed the patients History and Physical, chart, labs and discussed the procedure including the risks, benefits and alternatives for the proposed anesthesia with the patient or authorized representative who has indicated his/her understanding and acceptance.     Dental advisory given  Plan Discussed with: CRNA and Anesthesiologist  Anesthesia Plan Comments:         Anesthesia  Quick Evaluation

## 2021-05-23 NOTE — Anesthesia Procedure Notes (Signed)
Anesthesia Regional Block: Pectoralis block   Pre-Anesthetic Checklist: , timeout performed,  Correct Patient, Correct Site, Correct Laterality,  Correct Procedure, Correct Position, site marked,  Risks and benefits discussed,  Surgical consent,  Pre-op evaluation,  At surgeon's request and post-op pain management  Laterality: Right  Prep: chloraprep       Needles:  Injection technique: Single-shot  Needle Type: Echogenic Stimulator Needle     Needle Length: 9cm  Needle Gauge: 21   Needle insertion depth: 7 cm   Additional Needles:   Procedures:,,,, ultrasound used (permanent image in chart),,    Narrative:  Start time: 05/23/2021 9:36 AM End time: 05/23/2021 9:41 AM Injection made incrementally with aspirations every 5 mL.  Performed by: Personally  Anesthesiologist: Josephine Igo, MD  Additional Notes: Timeout performed. Patient sedated. Relevant anatomy ID'd using [place]. Incremental 2-48ml injection of LA with frequent aspiration. Patient tolerated procedure well.    Right Pectoralis Block

## 2021-05-23 NOTE — Op Note (Signed)
Preoperative diagnosis: Clinical stage II left breast cancer status post primary chemotherapy Postoperative diagnosis: Same as above Procedure: 1.  Right risk reduction skin sparing mastectomy 2.  Left skin sparing mastectomy 3.  Left deep axillary sentinel lymph node biopsy 4.  Left radioactive seed axillary node guided excision 5.  Injection of mag trace for sentinel lymph node identification Surgeon: Dr. Serita Grammes Anesthesia: General with bilateral pectoral blocks Estimated blood loss: 200 cc Drains: Per plastic surgery Complications: None Specimens: 1.  Right breast tissue marked short superior, long lateral 2.  Left breast tissue marked short superior, long lateral 3.  Left axillary radioactive seed containing node that was also a sentinel node 4.  Additional left axillary nodes Disposition Case turned over to plastic surgery upon completion  Indications: This a 36 year old female who initially presented with a left breast mass.  Initial MRI showed 3 abnormal left axillary nodes and a left breast with 3 spiculated masses with a total enhancement measuring 4.7 x 7.4 x 5 cm.  She had a 80% ER positive tumor, 3% PR positive tumor, HER2 was positive and it was grade 3.  She underwent primary systemic therapy with a complete response by imaging.  We discussed all of her options and she wanted to proceed with bilateral mastectomies and I discussed a targeted lymph node dissection with her.  Procedure: After informed consent was obtained I first injected 2 cc of mag trace in the subareolar position after prepping the area.  This was then followed by bilateral pectoral blocks with anesthesia.  She was given antibiotics.  SCDs were placed.  She was taken to the operating room where she was placed under general anesthesia without complication.  She was prepped and draped in the standard sterile surgical fashion.  A surgical timeout was then performed.  I did the right side first.  I made an  incision that encompassed the nipple areolar complex.  I created flaps in the parasternal area, clavicle, lateral border of the breast and the inframammary fold.  I then remove the breast and the pectoralis fashion from the muscle and marked this as above.  I obtained hemostasis and packed this side.  I made a similar incision on the left side and created flaps in the same way.  This side was where all of the blood loss occurred.  This was very vascular.  I then remove the breast and the fascia from the muscle and passed this off the table.  I then was able to identify the radioactive seed containing node and I remove this.  This also had mag trace activity as well.  There were several other lymph nodes that had a low volume of mag trace activity that were also palpable.  I remove these and send them as a separate specimen.  Mammogram confirmed removal of the radioactive seed.  This essentially was a low axillary dissection upon completion with all the sentinel nodes and palpable nodes.  There is nothing else palpable in her axilla.  I then obtained hemostasis.  I packed this and turned the case over to plastic surgery.

## 2021-05-23 NOTE — Anesthesia Procedure Notes (Signed)
Anesthesia Regional Block: Pectoralis block   Pre-Anesthetic Checklist: , timeout performed,  Correct Patient, Correct Site, Correct Laterality,  Correct Procedure, Correct Position, site marked,  Risks and benefits discussed,  Surgical consent,  Pre-op evaluation,  At surgeon's request and post-op pain management  Laterality: Left  Prep: chloraprep       Needles:  Injection technique: Single-shot  Needle Type: Echogenic Stimulator Needle     Needle Length: 10cm  Needle Gauge: 21   Needle insertion depth: 7 cm   Additional Needles:   Procedures:,,,, ultrasound used (permanent image in chart),,    Narrative:  Start time: 05/23/2021 9:31 AM End time: 05/23/2021 9:36 AM Injection made incrementally with aspirations every 5 mL.  Performed by: Personally  Anesthesiologist: Josephine Igo, MD  Additional Notes: Timeout performed. Patient sedated. Relevant anatomy ID'd using [place]. Incremental 2-35ml injection of LA with frequent aspiration. Patient tolerated procedure well.    Left Pectoralis Block

## 2021-05-23 NOTE — Telephone Encounter (Signed)
Beulah Gandy, nurse taking care of patient after surgery today at [facility].  Levada Dy said she would like to talk with a doctor or PA regarding placing some orders for her.  Patient is asking about some Ambien to help her sleep.  Levada Dy said she doesn't know if the morphine that's ordered every three hours is going to control her pain.  She said the patient came up to her in tears.  Levada Dy wants to talk with someone about adjusting the orders.  Please call.

## 2021-05-24 ENCOUNTER — Other Ambulatory Visit (HOSPITAL_COMMUNITY): Payer: Self-pay

## 2021-05-24 ENCOUNTER — Encounter (HOSPITAL_COMMUNITY): Payer: Self-pay | Admitting: General Surgery

## 2021-05-24 ENCOUNTER — Telehealth: Payer: Self-pay | Admitting: *Deleted

## 2021-05-24 ENCOUNTER — Encounter: Payer: Self-pay | Admitting: Hematology and Oncology

## 2021-05-24 DIAGNOSIS — Z9221 Personal history of antineoplastic chemotherapy: Secondary | ICD-10-CM | POA: Diagnosis not present

## 2021-05-24 DIAGNOSIS — Z170 Estrogen receptor positive status [ER+]: Secondary | ICD-10-CM | POA: Diagnosis not present

## 2021-05-24 DIAGNOSIS — C50412 Malignant neoplasm of upper-outer quadrant of left female breast: Secondary | ICD-10-CM | POA: Diagnosis not present

## 2021-05-24 DIAGNOSIS — Z79899 Other long term (current) drug therapy: Secondary | ICD-10-CM | POA: Diagnosis not present

## 2021-05-24 DIAGNOSIS — Z803 Family history of malignant neoplasm of breast: Secondary | ICD-10-CM | POA: Diagnosis not present

## 2021-05-24 MED ORDER — METHOCARBAMOL 500 MG PO TABS
500.0000 mg | ORAL_TABLET | Freq: Two times a day (BID) | ORAL | 0 refills | Status: AC | PRN
Start: 2021-05-24 — End: 2021-06-03
  Filled 2021-05-24: qty 20, 10d supply, fill #0

## 2021-05-24 MED ORDER — OXYCODONE HCL 15 MG PO TABS
15.0000 mg | ORAL_TABLET | ORAL | 0 refills | Status: DC | PRN
  Filled 2021-05-24: qty 20, 4d supply, fill #0

## 2021-05-24 NOTE — Progress Notes (Signed)
Mobility Specialist Progress Note:   05/24/21 1045  Mobility  Activity Ambulated independently in hallway  Level of Assistance Independent  Assistive Device None  Distance Ambulated (ft) 50 ft  Activity Response Tolerated poorly  $Mobility charge 1 Mobility   Pt agreeable to OOB mobility. Significant increase in pain upon ambulation. Further mobility deferred. Pt back in bed, RN notified.   Nelta Numbers Mobility Specialist  Phone [phone]

## 2021-05-24 NOTE — Discharge Instructions (Signed)
Activity: Avoid strenuous activity.  No heavy lifting.  Diet: No restrictions.  Try to optimize nutrition with plenty of fruits and vegetables to improve healing.  Wound Care: Leave ACE wrap for 7 days.  You may replace underlying gauze dressing as needed.  Please continue to monitor output from all three drains. Recommend sponge bathing for next 5-7 days.  After ACE wrap comes off recommend sports bra for gentle compression.  Avoid any bra with under-wire until cleared by Dr. Claudia Desanctis.  Follow-Up: Scheduled for next week.  Things to watch for:  Call the office if you experience fever, chills, persistent nausea, or significant bleeding.  Mild wound drainage is common after breast reduction surgery and should not be cause for alarm.

## 2021-05-24 NOTE — Discharge Summary (Signed)
Physician Discharge Summary  Patient ID: YUNUEN MORDAN MRN: [id number] DOB/AGE: 08/25/1985 36 y.o.  Admit date: 05/23/2021 Discharge date: 05/24/2021  Admission Diagnoses:  Discharge Diagnoses:  Principal Problem:   S/P breast reconstruction   Discharged Condition: Patient is feeling improved this morning.  She had some difficulty with pain control yesterday, but states that she is going to work towards transitioning to her usual at home oral 15 mg oxycodone.  She is tolerating p.o. intake without difficulty and has been ambulatory.  Voiding.  Reports that drains are working well.  She has kept the Ace wrap in place.  Hospital Course: Admitted for observation s/p bilateral mastectomy with immediate reconstruction using tissue expanders and Flex HD.  Consults: None  Significant Diagnostic Studies: None  Treatments: Pain control for postoperative recovery s/p bilateral mastectomy with immediate reconstruction.  Discharge Exam: Blood pressure (!) 99/53, pulse 73, temperature 98.9 F (37.2 C), resp. rate 18, height 5\' 2"  (1.575 m), weight 68.9 kg, SpO2 100 %, unknown if currently breastfeeding. General appearance: alert, cooperative, and no acute distress Chest wall: No significant bruising or obvious subcutaneous fluid collections.  No asymmetries.  JP drains intact and functional.  Normal-appearing drainage in bulbs bilaterally. Extremities: No lower extremity swelling or edema.  Ambulatory.  Disposition: Discharge disposition: 01-Home or Self Care     Given patient's continued postoperative discomfort and hesitance to go home this morning, will delay discharge until this afternoon.  She believes that she will feel more comfortable at that time.  We will transition to oral analgesia only until then to help with her transition to home regimen.  She also reported improvement of symptoms with Robaxin last evening, will prescribe short course to help her moderate her symptoms at home.     Discharge Instructions     Diet - low sodium heart healthy   Complete by: As directed    Increase activity slowly   Complete by: As directed         [facility] [address] [place], [place] [address] [phone]  Signed: Krista Blue 05/24/2021, 9:55 AM

## 2021-05-24 NOTE — Progress Notes (Signed)
1 Day Post-Op   Subjective/Chief Complaint: Doing better this am, up and oob, voiding   Objective: Vital signs in last 24 hours: Temp:  [98.4 F (36.9 C)-99.5 F (37.5 C)] 98.8 F (37.1 C) (01/23 2034) Pulse Rate:  [68-81] 71 (01/24 0426) Resp:  [13-25] 17 (01/23 2034) BP: (98-170)/(52-101) 98/52 (01/24 0449) SpO2:  [100 %] 100 % (01/24 0426)    Intake/Output from previous day: 01/23 0701 - 01/24 0700 In: 1890 [P.O.:240; I.V.:1600; IV Piggyback:50] Out: 690 [Urine:130; Drains:360; Blood:200] Intake/Output this shift: No intake/output data recorded.  Flaps viable, drains serosang no hematoma  Lab Results:  No results for input(s): WBC, HGB, HCT, PLT in the last 72 hours. BMET No results for input(s): NA, K, CL, CO2, GLUCOSE, BUN, CREATININE, CALCIUM in the last 72 hours. PT/INR No results for input(s): LABPROT, INR in the last 72 hours. ABG No results for input(s): PHART, HCO3 in the last 72 hours.  Invalid input(s): PCO2, PO2  Studies/Results: MM Breast Surgical Specimen  Result Date: 05/23/2021 CLINICAL DATA:  Evaluate surgical specimen following targeted excision of LEFT axillary lymph node metastasis EXAM: SPECIMEN RADIOGRAPH OF THE LEFT AXILLA COMPARISON:  Previous exam(s). FINDINGS: Status post excision of the LEFT axilla. The radioactive seed and biopsy marker clip are present and completely intact. IMPRESSION: Specimen radiograph of the LEFT axilla. Electronically Signed   By: Margarette Canada M.D.   On: 05/23/2021 12:30   Anti-infectives: Anti-infectives (From admission, onward)    Start     Dose/Rate Route Frequency Ordered Stop   05/23/21 2200  sulfamethoxazole-trimethoprim (BACTRIM DS) 800-160 MG per tablet 1 tablet        1 tablet Oral 2 times daily 05/23/21 1415 06/01/21 2159   05/23/21 1200  ceFAZolin 1 g / gentamicin 80 mg in NS 500 mL surgical irrigation         Irrigation  Once 05/23/21 1152 05/23/21 1305   05/23/21 0830  ceFAZolin (ANCEF) IVPB 2g/100 mL  premix        2 g 200 mL/hr over 30 Minutes Intravenous On call to O.R. 05/23/21 0826 05/23/21 1105   05/23/21 0830  ceFAZolin (ANCEF) IVPB 2g/100 mL premix  Status:  Discontinued        2 g 200 mL/hr over 30 Minutes Intravenous On call to O.R. 05/23/21 7412 05/23/21 0835       Assessment/Plan: POD 1 bilateral mastectomies, TAD -likely dc home later today -path pending  Rolm Bookbinder 05/24/2021

## 2021-05-24 NOTE — Progress Notes (Signed)
Discharge:  Pt d/c from room via wheelchair, Family member with the pt.  Discharge instructions given to the patient and family members.  No questions from pt,  r  Pt dressed in street clothes and left with discharge papers and prescriptions sent to the preferred pharmacy.  IV d/ced,

## 2021-05-24 NOTE — Telephone Encounter (Signed)
Received call from pt while currently hospitalized for mastectomy surgery.  Pt states surgeon has informed her that she will need to receive post op pain medication from MD.  RN reviewed with MD who states he does not feel comfortable with managing post op pain and to redirect management to surgery team.  RN alerted surgeon and his RN  for closer pain management. Pt educated that if her pain is not well controlled, she will need to reach out to her surgeon.  Pt verbalized understanding.

## 2021-05-24 NOTE — Social Work (Signed)
°  Transition of Care [facility]) Screening Note   Patient Details  Name: Robin Horn Date of Birth: 11/08/1985   Transition of Care [facility]) CM/SW Contact:    Emeterio Reeve, LCSW Phone Number: 05/24/2021, 12:45 PM    Transition of Care Department [facility]) has reviewed patient and no TOC needs have been identified at this time. We will continue to monitor patient advancement through interdisciplinary progression rounds. If new patient transition needs arise, please place a TOC consult.

## 2021-05-25 ENCOUNTER — Other Ambulatory Visit: Payer: Self-pay | Admitting: Hematology and Oncology

## 2021-05-25 NOTE — Progress Notes (Incomplete)
Patient Care Team: [name], DO as PCP - General (Internal Medicine) Claudia Pollock, RN as Registered Nurse Dayna Barker, MD as Consulting Physician (General Surgery) Mauro Kaufmann, RN as Oncology Nurse Navigator Rockwell Germany, RN as Oncology Nurse Navigator  DIAGNOSIS: No diagnosis found.  SUMMARY OF ONCOLOGIC HISTORY: Oncology History  Left breast lump (Resolved)  Malignant neoplasm of upper-outer quadrant of left breast in female, estrogen receptor positive ([place])  12/17/2020 Initial Diagnosis   Palpable lump in the upper outer left breast. 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, Her2+ (3+)/ER+ (80%)/PR- (<1%) in the left breast, and invasive ductal carcinoma, Her2+ (3+)/ER+ (80%)/PR+ (3%) in the left axillary lymph node   12/23/2020 Cancer Staging   Staging form: Breast, AJCC 8th Edition - Clinical stage from 12/23/2020: Stage IB (cT2, cN1, cM0, G3, ER+, PR+, HER2+) - Signed by [name], MD on 12/23/2020 Stage prefix: Initial diagnosis Histologic grading system: 3 grade system    01/11/2021 -  Chemotherapy   Patient is on Treatment Plan : BREAST  Docetaxel + Carboplatin + Trastuzumab + Pertuzumab  (TCHP) q21d      01/31/2021 Genetic Testing   Negative hereditary cancer genetic testing: no pathogenic variants detected in [facility] Common Hereditary Cancers +RNA Panel.  The report date is January 31, 2021.    The Common Hereditary Cancers + RNA Panel offered by [facility] includes sequencing, deletion/duplication, and RNA testing of the following 47 genes: APC, ATM, AXIN2, BARD1, BMPR1A, BRCA1, BRCA2, BRIP1, CDH1, CDK4*, CDKN2A (p14ARF)*, CDKN2A (p16INK4a)*, CHEK2, CTNNA1, DICER1, EPCAM (Deletion/duplication testing only), GREM1 (promoter region deletion/duplication  testing only), KIT, MEN1, MLH1, MSH2, MSH3, MSH6, MUTYH, NBN, NF1, NHTL1, PALB2, PDGFRA*, PMS2, POLD1, POLE, PTEN, RAD50, RAD51C, RAD51D, SDHB, SDHC, SDHD, SMAD4, SMARCA4. STK11, TP53, TSC1, TSC2, and VHL.  The following genes were evaluated for sequence changes only: SDHA and HOXB13 c.251G>A variant only.  RNA analysis is not performed for the * genes.       CHIEF COMPLIANT: TCHP cycle 7  INTERVAL HISTORY: Robin Horn is a 36 y.o. with above-mentioned history of left breast cancer currently on chemotherapy with TCHP. She presents to the clinic today for follow-up.   ALLERGIES:  is allergic to hydrocodone-acetaminophen, tramadol, and lactose intolerance (gi).  MEDICATIONS:  Current Outpatient Medications  Medication Sig Dispense Refill   acetaminophen (TYLENOL) 500 MG tablet Take 1,000 mg by mouth every 6 (six) hours as needed (pain). (Patient not taking: Reported on 05/19/2021)     gabapentin (NEURONTIN) 100 MG capsule Take 1 capsule (100 mg total) by mouth 2 (two) times daily. (Patient not taking: Reported on 05/17/2021) 60 capsule 0   lidocaine (XYLOCAINE) 2 % solution Use as directed 15 mLs in the mouth or throat every 3 (three) hours as needed for mouth pain. (Patient not taking: Reported on 05/17/2021) 100 mL 0   lidocaine-prilocaine (EMLA) cream Apply to affected area once (Patient not taking: Reported on 05/17/2021) 30 g 3   lisinopril (ZESTRIL) 10 MG tablet Take 10 mg by mouth daily.     methocarbamol (ROBAXIN) 500 MG tablet Take 1 tablet by mouth 2 times daily as needed for up to 10 days for muscle spasms. 20 tablet 0   ondansetron (ZOFRAN) 8 MG tablet Take 1 tablet (8 mg total) by mouth 2 (two) times daily as needed (Nausea  or vomiting). Start on the third day after chemotherapy. (Patient not taking: Reported on 05/17/2021) 30 tablet 1   ondansetron (ZOFRAN-ODT) 4 MG disintegrating tablet Take 1 tablet (4 mg total) by mouth every 8 (eight) hours as needed for nausea or vomiting. 20  tablet 0   oxyCODONE (ROXICODONE) 15 MG immediate release tablet Take 1 tablet (15 mg total) by mouth every 8 (eight) hours as needed for pain. 90 tablet 0   oxyCODONE (ROXICODONE) 15 MG immediate release tablet Take 1 tablet by mouth every 4 hours as needed for moderate pain. 20 tablet 0   prochlorperazine (COMPAZINE) 10 MG tablet Take 1 tablet (10 mg total) by mouth every 6 (six) hours as needed (Nausea or vomiting). (Patient not taking: Reported on 05/17/2021) 30 tablet 1   QUEtiapine (SEROQUEL) 100 MG tablet Take 1 tablet (100 mg total) by mouth at bedtime. (Patient not taking: Reported on 05/19/2021) 30 tablet 3   sulfamethoxazole-trimethoprim (BACTRIM DS) 800-160 MG tablet Take 1 tablet by mouth 2 (two) times daily for 14 days. 28 tablet 0   zolpidem (AMBIEN) 5 MG tablet Take 1 tablet (5 mg total) by mouth at bedtime as needed. (Patient not taking: Reported on 05/19/2021) 30 tablet 3   No current facility-administered medications for this visit.    PHYSICAL EXAMINATION: ECOG PERFORMANCE STATUS: {CHL ONC ECOG PS:[phone]}  There were no vitals filed for this visit. There were no vitals filed for this visit.  LABORATORY DATA:  I have reviewed the data as listed CMP Latest Ref Rng & Units 05/17/2021 04/26/2021 04/05/2021  Glucose 70 - 99 mg/dL 94 90 99  BUN 6 - 20 mg/dL 13 14 12   Creatinine 0.44 - 1.00 mg/dL 1.11(H) 1.30(H) 1.14(H)  Sodium 135 - 145 mmol/L 137 141 139  Potassium 3.5 - 5.1 mmol/L 3.6 3.6 3.8  Chloride 98 - 111 mmol/L 101 108 105  CO2 22 - 32 mmol/L 28 26 24   Calcium 8.9 - 10.3 mg/dL 9.2 8.9 9.1  Total Protein 6.5 - 8.1 g/dL - 6.3(L) 6.7  Total Bilirubin 0.3 - 1.2 mg/dL - 0.2(L) 0.3  Alkaline Phos 38 - 126 U/L - 61 75  AST 15 - 41 U/L - 11(L) 10(L)  ALT 0 - 44 U/L - 6 6    Lab Results  Component Value Date   WBC 6.1 05/17/2021   HGB 8.3 (L) 05/17/2021   HCT 26.0 (L) 05/17/2021   MCV 97.0 05/17/2021   PLT 121 (L) 05/17/2021   NEUTROABS 2.2 04/26/2021     ASSESSMENT & PLAN:  No problem-specific Assessment & Plan notes found for this encounter.    No orders of the defined types were placed in this encounter.  The patient has a good understanding of the overall plan. she agrees with it. she will call with any problems that may develop before the next visit here.  Total time spent: *** mins including face to face time and time spent for planning, charting and coordination of care  Rulon Eisenmenger, MD, MPH 05/25/2021  I, Thana Ates, am acting as scribe for Dr. [name].  {insert scribe attestation}

## 2021-05-26 ENCOUNTER — Inpatient Hospital Stay: Payer: Medicaid Other

## 2021-05-26 ENCOUNTER — Other Ambulatory Visit (HOSPITAL_COMMUNITY): Payer: Self-pay

## 2021-05-26 ENCOUNTER — Inpatient Hospital Stay: Payer: Medicaid Other | Admitting: Hematology and Oncology

## 2021-05-26 ENCOUNTER — Other Ambulatory Visit: Payer: Self-pay | Admitting: Hematology and Oncology

## 2021-05-26 DIAGNOSIS — C50412 Malignant neoplasm of upper-outer quadrant of left female breast: Secondary | ICD-10-CM | POA: Diagnosis not present

## 2021-05-26 DIAGNOSIS — Z170 Estrogen receptor positive status [ER+]: Secondary | ICD-10-CM | POA: Diagnosis not present

## 2021-05-26 LAB — SURGICAL PATHOLOGY

## 2021-05-30 ENCOUNTER — Ambulatory Visit (HOSPITAL_COMMUNITY): Payer: Medicaid Other

## 2021-05-30 ENCOUNTER — Ambulatory Visit (INDEPENDENT_AMBULATORY_CARE_PROVIDER_SITE_OTHER): Payer: Medicaid Other | Admitting: Surgical

## 2021-05-30 ENCOUNTER — Telehealth: Payer: Self-pay

## 2021-05-30 ENCOUNTER — Other Ambulatory Visit: Payer: Self-pay

## 2021-05-30 ENCOUNTER — Encounter: Payer: Self-pay | Admitting: Surgical

## 2021-05-30 ENCOUNTER — Telehealth: Payer: Self-pay | Admitting: *Deleted

## 2021-05-30 DIAGNOSIS — Z170 Estrogen receptor positive status [ER+]: Secondary | ICD-10-CM

## 2021-05-30 DIAGNOSIS — C50412 Malignant neoplasm of upper-outer quadrant of left female breast: Secondary | ICD-10-CM

## 2021-05-30 NOTE — Progress Notes (Signed)
Patient is a 36 year old female here for follow-up after bilateral mastectomy, left sentinel lymph node excision and immediate bilateral breast reconstruction placement of tissue expanders and Flex HD with Dr. Donne Hazel and Dr. Claudia Desanctis.  Patient presents today for concerns of her left JP drain bulb breaking.  She reports that 2 days ago the bulb where it attached to the tubing broke off.  She has this clamped with hair ties.  She reports she is having a lot of pain. She reports that she is going to see a dentist on Wednesday to have some teeth removed.  She has had some abscesses in her mouth per EMR review.  She is requesting pain medication today.  Per EMR review, she has been having pain and oncology was assisting patient with pain control until she was able to be evaluated by pain management providers at [facility] health's physical medicine office.  Upon initial evaluation at physical medicine patient underwent UDS which was positive for cocaine.  Patient was prescribed oxycodone 15 by Dr. Donne Hazel after her bilateral mastectomy.  Chaperone present on exam On exam bilateral breast incisions appear intact, Steri-Strips are in place.  Bilateral mastectomy flaps appear viable.  2 left JP drains in place with serosanguineous drainage noted in tubing.  Posterior left JP drain did not have a bulb.  A new bulb was attached and serosanguineous drainage began collecting in the bulb.  Right JP drain in place with serosanguineous drainage in bulb.  No erythema or cellulitic changes noted of either breast.  No subcutaneous fluid collections noted with palpation.   Plan: 36 year old female here for follow-up after bilateral immediate breast reconstruction on 05/30/2021.  In regards to her breast reconstruction and mastectomy she appears to be healing well, I do not appreciate any signs of infection.  I was able to place a new bulb on the left JP drain tubing, patient tolerated this well.  Patient is requesting pain  medication today, upon PDMP review she received 30 days of oxycodone 15 on 05/06/2021 by Dr. Lindi Adie with oncology.  She then subsequently received an additional prescription from Dr. Donne Hazel on 05/24/2021 for 20 doses of oxycodone 15 mg.  Patient reports she is out of pain medication.   I called Dr. Geralyn Flash office and spoke with his nursing staff.  Nursing staff stated that they were no longer prescribing patient narcotics as she failed a urine drug screen which was positive for cocaine.  She was also unable to receive any ongoing chronic pain medication from the physical medicine office due to her positive urine drug screen.  Given patient's positive urine drug screen and multiple narcotic prescriptions within the last 30 days I do not feel comfortable prescribing any additional narcotics.  She received a 30-day supply for oxycodone 15 mg on 05/06/2021 and an additional prescription of 20 oxycodone 15 mg on 05/24/2021.  Expressed my concerns to the patient.  Recommend following up in 1 week for reevaluation.  Recommend calling with questions or concerns.  I do not see any signs of infection on exam.

## 2021-05-30 NOTE — Telephone Encounter (Addendum)
Voicemail from Robin Horn [phone] (home) "Requesting return call about Disability information sent." Introduced self as forms nurse with return call inquiry of disability needs and/or forms.    "Called because Social Services Disability department was to send a form for provider to complete and provide information."  Advised of no new forms yet received since initial Social Services [phone] medical examination form signed by Dr. Lindi Adie 02/26/2021 per media in EMR.  Noted a (DDS) Disability Determination Services  record request from [place], [place] dated 05/10/2021 received by (SW) H.I.M. on 05/13/2021, the (SW) H.I.M; department manages records.  Provided SW H.I.M. phone number [phone] opt 2) for Robin Horn to call for status check of record information.  Call ended by patient with no current question or needs.

## 2021-05-30 NOTE — Telephone Encounter (Signed)
Returned pt phone call, pt states she was calling to inform us that she saw plastic surgery today and they would not give her pain medications.  I advised pt to speak with her surgeon if she is still having post op pain.  Pt expressed frustrations and asked if this nurse could communicate with her surgeon regarding the pain meds. I agreed, pt verbalized thanks.  [facility] RN messaged Dr Donne Hazel regarding the above.

## 2021-05-31 ENCOUNTER — Encounter: Payer: Self-pay | Admitting: *Deleted

## 2021-06-01 ENCOUNTER — Encounter: Payer: Medicaid Other | Admitting: Plastic Surgery

## 2021-06-01 ENCOUNTER — Encounter: Payer: Medicaid Other | Admitting: Physician Assistant

## 2021-06-01 DIAGNOSIS — Z419 Encounter for procedure for purposes other than remedying health state, unspecified: Secondary | ICD-10-CM | POA: Diagnosis not present

## 2021-06-02 ENCOUNTER — Other Ambulatory Visit (HOSPITAL_COMMUNITY): Payer: Self-pay

## 2021-06-06 ENCOUNTER — Other Ambulatory Visit (HOSPITAL_COMMUNITY): Payer: Self-pay

## 2021-06-06 ENCOUNTER — Encounter: Payer: Self-pay | Admitting: Hematology and Oncology

## 2021-06-07 NOTE — Progress Notes (Signed)
Robin Care Team: [name], DO as PCP - General (Internal Medicine) Claudia Pollock, RN as Registered Nurse Dayna Barker, MD as Consulting Physician (General Surgery) Mauro Kaufmann, RN as Oncology Nurse Navigator Rockwell Germany, RN as Oncology Nurse Navigator  DIAGNOSIS:    ICD-10-CM   1. Malignant neoplasm of upper-outer quadrant of left breast in female, estrogen receptor positive ([place])  C50.412 ONCBCN PHYSICIAN COMMUNICATION 1   Z17.0 [place] OF ONCOLOGIC HISTORY: Oncology History  Left breast lump (Resolved)  Malignant neoplasm of upper-outer quadrant of left breast in female, estrogen receptor positive ([place])  12/17/2020 Initial Diagnosis   Palpable lump in the upper outer left breast. 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, Her2+ (3+)/ER+ (80%)/PR- (<1%) in the left breast, and invasive ductal carcinoma, Her2+ (3+)/ER+ (80%)/PR+ (3%) in the left axillary lymph node   12/23/2020 Cancer Staging   Staging form: Breast, AJCC 8th Edition - Clinical stage from 12/23/2020: Stage IB (cT2, cN1, cM0, G3, ER+, PR+, HER2+) - Signed by [name], MD on 12/23/2020 Stage prefix: Initial diagnosis Histologic grading system: 3 grade system    01/11/2021 -  Chemotherapy   Robin is on Treatment Plan : BREAST  Docetaxel + Carboplatin + Trastuzumab + Pertuzumab  (TCHP) q21d      01/31/2021 Genetic Testing   Negative hereditary cancer genetic testing: no pathogenic variants detected in [facility] Common Hereditary Cancers +RNA Panel.  The report date is January 31, 2021.    The Common Hereditary Cancers + RNA Panel offered by [facility] includes sequencing, deletion/duplication, and RNA testing of the following 47  genes: APC, ATM, AXIN2, BARD1, BMPR1A, BRCA1, BRCA2, BRIP1, CDH1, CDK4*, CDKN2A (p14ARF)*, CDKN2A (p16INK4a)*, CHEK2, CTNNA1, DICER1, EPCAM (Deletion/duplication testing only), GREM1 (promoter region deletion/duplication testing only), KIT, MEN1, MLH1, MSH2, MSH3, MSH6, MUTYH, NBN, NF1, NHTL1, PALB2, PDGFRA*, PMS2, POLD1, POLE, PTEN, RAD50, RAD51C, RAD51D, SDHB, SDHC, SDHD, SMAD4, SMARCA4. STK11, TP53, TSC1, TSC2, and VHL.  The following genes were evaluated for sequence changes only: SDHA and HOXB13 c.251G>A variant only.  RNA analysis is not performed for the * genes.     05/23/2021 Surgery   Left mastectomy: Negative for residual cancer.  Extensive residual high-grade DCIS, 0/4 lymph nodes negative Right mastectomy: Benign Complete pathologic response     CHIEF COMPLIANT: Follow-up after breast surgery  INTERVAL HISTORY: Robin Horn is a 36 y.o. with above-mentioned history of left breast cancer, completed chemotherapy with TCHP. She presents to the clinic today for follow-up after having undergone breast surgery.  She underwent bilateral mastectomies with reconstruction.  She is healing and recovering from the surgery.  ALLERGIES:  is allergic to hydrocodone-acetaminophen, tramadol, and lactose intolerance (gi).  MEDICATIONS:  Current Outpatient Medications  Medication Sig Dispense Refill   acetaminophen (TYLENOL) 500 MG tablet Take 1,000 mg by mouth every 6 (six) hours as needed (pain).     gabapentin (NEURONTIN) 100 MG capsule Take 1 capsule (100 mg total) by mouth 2 (two) times daily. (Robin not taking: Reported on 05/17/2021) 60 capsule 0   lidocaine (XYLOCAINE) 2 % solution Use as directed 15 mLs in the mouth or throat every 3 (three) hours as needed for mouth pain. 100 mL 0   lisinopril (ZESTRIL) 10 MG tablet Take 10  mg by mouth daily.     oxyCODONE (ROXICODONE) 15 MG immediate release tablet Take 1 tablet (15 mg total) by mouth every 8 (eight) hours as needed for pain. 90 tablet  0   QUEtiapine (SEROQUEL) 100 MG tablet Take 1 tablet (100 mg total) by mouth at bedtime. 30 tablet 3   zolpidem (AMBIEN) 5 MG tablet TAKE 1 TABLET BY MOUTH AT BEDTIME AS NEEDED 30 tablet 0   No current facility-administered medications for this visit.    PHYSICAL EXAMINATION: ECOG PERFORMANCE STATUS: 1 - Symptomatic but completely ambulatory  Vitals:   06/08/21 1025  BP: (!) 143/92  Pulse: 72  Resp: 18  Temp: 97.7 F (36.5 C)  SpO2: 100%   Filed Weights   06/08/21 1025  Weight: 150 lb 1.6 oz (68.1 kg)    LABORATORY DATA:  I have reviewed the data as listed CMP Latest Ref Rng & Units 05/17/2021 04/26/2021 04/05/2021  Glucose 70 - 99 mg/dL 94 90 99  BUN 6 - 20 mg/dL 13 14 12   Creatinine 0.44 - 1.00 mg/dL 1.11(H) 1.30(H) 1.14(H)  Sodium 135 - 145 mmol/L 137 141 139  Potassium 3.5 - 5.1 mmol/L 3.6 3.6 3.8  Chloride 98 - 111 mmol/L 101 108 105  CO2 22 - 32 mmol/L 28 26 24   Calcium 8.9 - 10.3 mg/dL 9.2 8.9 9.1  Total Protein 6.5 - 8.1 g/dL - 6.3(L) 6.7  Total Bilirubin 0.3 - 1.2 mg/dL - 0.2(L) 0.3  Alkaline Phos 38 - 126 U/L - 61 75  AST 15 - 41 U/L - 11(L) 10(L)  ALT 0 - 44 U/L - 6 6    Lab Results  Component Value Date   WBC 5.3 06/08/2021   HGB 8.5 (L) 06/08/2021   HCT 26.9 (L) 06/08/2021   MCV 94.4 06/08/2021   PLT 332 06/08/2021   NEUTROABS 3.2 06/08/2021    ASSESSMENT & PLAN:  Malignant neoplasm of upper-outer quadrant of left breast in female, estrogen receptor positive ([place]) 12/17/2020: Palpable lump in the upper outer left breast. 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  Neoadjuvant chemotherapy with TCH Perjeta 6 cycles followed by Herceptin Perjeta maintenance versus Kadcyla maintenance (based on response to neoadjuvant chemo) for 1 year 2. Left mastectomy: Negative for residual cancer.  Extensive residual high-grade DCIS, 0/4 lymph nodes negative; Right mastectomy: Benign Complete pathologic response 3. Followed by adjuvant radiation therapy  4.  Followed by adjuvant antiestrogen therapy -------------------------------------------------------------------------------------------------------------------------------------------------- Current treatment: Herceptin Perjeta maintenance   Pathology counseling: I discussed the final pathology report of the Robin provided  a copy of this report. I discussed the margins as well as lymph node surgeries. We also discussed the final staging along with previously performed ER/PR and HER-2/neu testing.  Return to clinic every 3 weeks for Herceptin Perjeta and every 6 weeks to follow-up with me. Robin will also be referred for adjuvant radiation.    Orders Placed This Encounter  Procedures   ONCBCN PHYSICIAN COMMUNICATION 1    Trastuzumab/Pertuzumab SQ options for each treatment day available via "Add Orders".   PHYSICIAN COMMUNICATION ORDER    A baseline Echo/Muga should be obtained prior to initiation of  Trastuzumab, at 3, 6, 9 months during Trastuzumab treatment.   PHYSICIAN COMMUNICATION ORDER    Post Neo-adjuvant AC-THP. Repeat trastuzumab 8 mg/kg Loading Dose D1 if dose missed by > 1 week. Repeat pertuzumab (Perjeta) 840 mg Loading Dose D1 if 6 weeks or > since last dose   The Robin has a good understanding of the overall plan. she agrees with it. she will call with any problems that may develop before the next visit here.  Total time spent: 30 mins including face to face time and time spent for planning, charting and coordination of care  Rulon Eisenmenger, MD, MPH 06/08/2021  I, Thana Ates, am acting as  scribe for Dr. [name].  I have reviewed the above documentation for accuracy and completeness, and I agree with the above.

## 2021-06-07 NOTE — Assessment & Plan Note (Addendum)
12/17/2020:Palpable lump in the upper outer left breast.Diagnostic mammogram and [place] on08/17/22showed multiple suspicious masses, 2abnormal lymph nodes with increased cortical thickness,1 lymph node with borderline abnormal cortical thickness,Biopsy on08/19/22showed invasive ductal carcinoma and DCIS, Her2+ (3+)/ER+ (80%)/PR- (<1%) in the left breast, and invasive ductal carcinoma, Her2+ (3+)/ER+ (80%)/PR+ (3%) in the left axillary lymph node [id number] stage Ib  Treatment plan: 1. Neoadjuvant chemotherapy with TCH Perjeta 6 cycles followed by Herceptin Perjeta maintenance versus Kadcyla maintenance (based on response to neoadjuvant chemo) for 1 year 2. Left mastectomy: Negative for residual cancer.  Extensive residual high-grade DCIS, 0/4 lymph nodes negative; Right mastectomy: Benign Complete pathologic response 3. Followed by adjuvant radiation therapy 4.Followed by adjuvant antiestrogen therapy -------------------------------------------------------------------------------------------------------------------------------------------------- Current treatment: Herceptin Perjeta maintenance  Pathology counseling: I discussed the final pathology report of the patient provided  a copy of this report. I discussed the margins as well as lymph node surgeries. We also discussed the final staging along with previously performed ER/PR and HER-2/neu testing.  Return to clinic every 3 weeks for Herceptin Perjeta and every 6 weeks to follow-up with me. Patient will also be referred for adjuvant radiation.

## 2021-06-08 ENCOUNTER — Inpatient Hospital Stay: Payer: Medicaid Other | Attending: Hematology and Oncology

## 2021-06-08 ENCOUNTER — Inpatient Hospital Stay (HOSPITAL_BASED_OUTPATIENT_CLINIC_OR_DEPARTMENT_OTHER): Payer: Medicaid Other | Admitting: Hematology and Oncology

## 2021-06-08 ENCOUNTER — Ambulatory Visit (HOSPITAL_COMMUNITY)
Admission: RE | Admit: 2021-06-08 | Discharge: 2021-06-08 | Disposition: A | Discharge status: Home / Self Care | Payer: Medicaid Other | Source: Ambulatory Visit | Attending: Adult Health | Admitting: Adult Health

## 2021-06-08 ENCOUNTER — Other Ambulatory Visit: Payer: Self-pay

## 2021-06-08 ENCOUNTER — Inpatient Hospital Stay: Payer: Medicaid Other

## 2021-06-08 VITALS — BP 133/84 | HR 68 | Temp 98.1°F | Resp 18

## 2021-06-08 VITALS — BP 143/92 | HR 72 | Temp 97.7°F | Resp 18 | Ht 62.0 in | Wt 150.1 lb

## 2021-06-08 DIAGNOSIS — Z95828 Presence of other vascular implants and grafts: Secondary | ICD-10-CM

## 2021-06-08 DIAGNOSIS — Z79899 Other long term (current) drug therapy: Secondary | ICD-10-CM | POA: Diagnosis not present

## 2021-06-08 DIAGNOSIS — I10 Essential (primary) hypertension: Secondary | ICD-10-CM | POA: Diagnosis not present

## 2021-06-08 DIAGNOSIS — Z170 Estrogen receptor positive status [ER+]: Secondary | ICD-10-CM

## 2021-06-08 DIAGNOSIS — C50412 Malignant neoplasm of upper-outer quadrant of left female breast: Secondary | ICD-10-CM | POA: Insufficient documentation

## 2021-06-08 DIAGNOSIS — Z0189 Encounter for other specified special examinations: Secondary | ICD-10-CM | POA: Diagnosis not present

## 2021-06-08 DIAGNOSIS — Z5112 Encounter for antineoplastic immunotherapy: Secondary | ICD-10-CM | POA: Insufficient documentation

## 2021-06-08 DIAGNOSIS — Z01818 Encounter for other preprocedural examination: Secondary | ICD-10-CM | POA: Insufficient documentation

## 2021-06-08 LAB — CBC WITH DIFFERENTIAL (CANCER CENTER ONLY)
Abs Immature Granulocytes: 0.01 10*3/uL (ref 0.00–0.07)
Basophils Absolute: 0 10*3/uL (ref 0.0–0.1)
Basophils Relative: 0 %
Eosinophils Absolute: 0.2 10*3/uL (ref 0.0–0.5)
Eosinophils Relative: 4 %
HCT: 26.9 % — ABNORMAL LOW (ref 36.0–46.0)
Hemoglobin: 8.5 g/dL — ABNORMAL LOW (ref 12.0–15.0)
Immature Granulocytes: 0 %
Lymphocytes Relative: 30 %
Lymphs Abs: 1.6 10*3/uL (ref 0.7–4.0)
MCH: 29.8 pg (ref 26.0–34.0)
MCHC: 31.6 g/dL (ref 30.0–36.0)
MCV: 94.4 fL (ref 80.0–100.0)
Monocytes Absolute: 0.3 10*3/uL (ref 0.1–1.0)
Monocytes Relative: 6 %
Neutro Abs: 3.2 10*3/uL (ref 1.7–7.7)
Neutrophils Relative %: 60 %
Platelet Count: 332 10*3/uL (ref 150–400)
RBC: 2.85 MIL/uL — ABNORMAL LOW (ref 3.87–5.11)
RDW: 14.6 % (ref 11.5–15.5)
WBC Count: 5.3 10*3/uL (ref 4.0–10.5)
nRBC: 0 % (ref 0.0–0.2)

## 2021-06-08 LAB — CMP (CANCER CENTER ONLY)
ALT: 6 U/L (ref 0–44)
AST: 12 U/L — ABNORMAL LOW (ref 15–41)
Albumin: 3.7 g/dL (ref 3.5–5.0)
Alkaline Phosphatase: 63 U/L (ref 38–126)
Anion gap: 6 (ref 5–15)
BUN: 10 mg/dL (ref 6–20)
CO2: 29 mmol/L (ref 22–32)
Calcium: 9 mg/dL (ref 8.9–10.3)
Chloride: 104 mmol/L (ref 98–111)
Creatinine: 1.28 mg/dL — ABNORMAL HIGH (ref 0.44–1.00)
GFR, Estimated: 56 mL/min — ABNORMAL LOW (ref >60)
Glucose, Bld: 88 mg/dL (ref 70–99)
Potassium: 3.8 mmol/L (ref 3.5–5.1)
Sodium: 139 mmol/L (ref 135–145)
Total Bilirubin: 0.2 mg/dL — ABNORMAL LOW (ref 0.3–1.2)
Total Protein: 6.7 g/dL (ref 6.5–8.1)

## 2021-06-08 LAB — SAMPLE TO BLOOD BANK

## 2021-06-08 LAB — ECHOCARDIOGRAM COMPLETE
Area-P 1/2: 3.6 cm2
S' Lateral: 2.8 cm

## 2021-06-08 MED ORDER — TRASTUZUMAB-DKST CHEMO 150 MG IV SOLR
6.0000 mg/kg | Freq: Once | INTRAVENOUS | Status: AC
  Administered 2021-06-08: 399 mg via INTRAVENOUS
  Filled 2021-06-08: qty 19

## 2021-06-08 MED ORDER — SODIUM CHLORIDE 0.9% FLUSH
10.0000 mL | INTRAVENOUS | Status: DC | PRN
  Administered 2021-06-08: 10 mL

## 2021-06-08 MED ORDER — ACETAMINOPHEN 325 MG PO TABS
650.0000 mg | ORAL_TABLET | Freq: Once | ORAL | Status: AC
  Administered 2021-06-08: 650 mg via ORAL
  Filled 2021-06-08: qty 2

## 2021-06-08 MED ORDER — SODIUM CHLORIDE 0.9% FLUSH
10.0000 mL | Freq: Once | INTRAVENOUS | Status: AC
  Administered 2021-06-08: 10 mL

## 2021-06-08 MED ORDER — SODIUM CHLORIDE 0.9 % IV SOLN: Freq: Once | INTRAVENOUS | Status: AC

## 2021-06-08 MED ORDER — SODIUM CHLORIDE 0.9 % IV SOLN
420.0000 mg | Freq: Once | INTRAVENOUS | Status: AC
  Administered 2021-06-08: 420 mg via INTRAVENOUS
  Filled 2021-06-08: qty 14

## 2021-06-08 MED ORDER — DIPHENHYDRAMINE HCL 25 MG PO CAPS
25.0000 mg | ORAL_CAPSULE | Freq: Once | ORAL | Status: AC
  Administered 2021-06-08: 25 mg via ORAL
  Filled 2021-06-08: qty 1

## 2021-06-08 MED ORDER — HEPARIN SOD (PORK) LOCK FLUSH 100 UNIT/ML IV SOLN
500.0000 [IU] | Freq: Once | INTRAVENOUS | Status: AC | PRN
  Administered 2021-06-08: 500 [IU]

## 2021-06-08 NOTE — Patient Instructions (Signed)
[facility]  Discharge Instructions: Thank you for choosing [facility] to provide your oncology and hematology care.   If you have a lab appointment with the [facility], please go directly to the [facility] and check in at the registration area.   Wear comfortable clothing and clothing appropriate for easy access to any Portacath or PICC line.   We strive to give you quality time with your provider. You may need to reschedule your appointment if you arrive late (15 or more minutes).  Arriving late affects you and other patients whose appointments are after yours.  Also, if you miss three or more appointments without notifying the office, you may be dismissed from the clinic at the providers discretion.      For prescription refill requests, have your pharmacy contact our office and allow 72 hours for refills to be completed.    Today you received the following chemotherapy and/or immunotherapy agents: Trastuzumab, pertuzumab   To help prevent nausea and vomiting after your treatment, we encourage you to take your nausea medication as directed.  BELOW ARE SYMPTOMS THAT SHOULD BE REPORTED IMMEDIATELY: *FEVER GREATER THAN 100.4 F (38 C) OR HIGHER *CHILLS OR SWEATING *NAUSEA AND VOMITING THAT IS NOT CONTROLLED WITH YOUR NAUSEA MEDICATION *UNUSUAL SHORTNESS OF BREATH *UNUSUAL BRUISING OR BLEEDING *URINARY PROBLEMS (pain or burning when urinating, or frequent urination) *BOWEL PROBLEMS (unusual diarrhea, constipation, pain near the anus) TENDERNESS IN MOUTH AND THROAT WITH OR WITHOUT PRESENCE OF ULCERS (sore throat, sores in mouth, or a toothache) UNUSUAL RASH, SWELLING OR PAIN  UNUSUAL VAGINAL DISCHARGE OR ITCHING   Items with * indicate a potential emergency and should be followed up as soon as possible or go to the Emergency Department if any problems should occur.  Please show the CHEMOTHERAPY ALERT CARD or IMMUNOTHERAPY ALERT CARD at  check-in to the Emergency Department and triage nurse.  Should you have questions after your visit or need to cancel or reschedule your appointment, please contact [facility]  Dept: [phone]  and follow the prompts.  Office hours are 8:00 a.m. to 4:30 p.m. Monday - Friday. Please note that voicemails left after 4:00 p.m. may not be returned until the following business day.  We are closed weekends and major holidays. You have access to a nurse at all times for urgent questions. Please call the main number to the clinic Dept: [phone] and follow the prompts.   For any non-urgent questions, you may also contact your provider using MyChart. We now offer e-Visits for anyone 71 and older to request care online for non-urgent symptoms. For details visit [email or web address].   Also download the MyChart app! Go to the app store, search "MyChart", open the app, select [facility], and log in with your MyChart username and password.  Due to Covid, a mask is required upon entering the hospital/clinic. If you do not have a mask, one will be given to you upon arrival. For doctor visits, patients may have 1 support person aged 35 or older with them. For treatment visits, patients cannot have anyone with them due to current Covid guidelines and our immunocompromised population.

## 2021-06-08 NOTE — Progress Notes (Signed)
Ok to treat with echo completed this morning- EF 50%-55% per Dr. Lindi Adie.  Patient declined to stay for post perjeta observation due to childcare concerns. Patient discharged VSS.

## 2021-06-08 NOTE — Progress Notes (Signed)
Per MD Lindi Adie will continue current dosing of trastuzumab and perjeta, no need for reloading.  Larene Beach, PharmD

## 2021-06-08 NOTE — Progress Notes (Signed)
DISCONTINUE ON PATHWAY REGIMEN - Breast     A cycle is every 21 days:     Pertuzumab      Pertuzumab      Trastuzumab-xxxx      Trastuzumab-xxxx      Carboplatin      Docetaxel   **Always confirm dose/schedule in your pharmacy ordering system**  REASON: Other Reason PRIOR TREATMENT: [id number]: Docetaxel + Carboplatin + Trastuzumab IV + Pertuzumab IV (TCHP IV) q21 Days x 6 Cycles TREATMENT RESPONSE: Complete Response (CR)  START ON PATHWAY REGIMEN - Breast     A cycle is every 21 days:     Trastuzumab-xxxx      Pertuzumab   **Always confirm dose/schedule in your pharmacy ordering system**  Patient Characteristics: Post-Neoadjuvant Therapy and Resection, HER2 Positive, ER Positive, No Residual Disease, Adjuvant Targeted Therapy After Neoadjuvant Chemo/Targeted Therapy Therapeutic Status: Post-Neoadjuvant Therapy and Resection Residual Invasive Disease Post-Neoadjuvant Therapy<= No ER Status: Positive (+) HER2 Status: Positive (+) PR Status: Positive (+) Intent of Therapy: Curative Intent, Discussed with Patient

## 2021-06-08 NOTE — Progress Notes (Signed)
°  Echocardiogram 2D Echocardiogram has been performed.  Darlina Sicilian M 06/08/2021, 9:50 AM

## 2021-06-09 ENCOUNTER — Ambulatory Visit (INDEPENDENT_AMBULATORY_CARE_PROVIDER_SITE_OTHER): Payer: Medicaid Other | Admitting: Plastic Surgery

## 2021-06-09 ENCOUNTER — Telehealth: Payer: Self-pay | Admitting: Radiation Oncology

## 2021-06-09 ENCOUNTER — Telehealth: Payer: Self-pay | Admitting: Hematology and Oncology

## 2021-06-09 ENCOUNTER — Encounter: Payer: Self-pay | Admitting: *Deleted

## 2021-06-09 ENCOUNTER — Encounter: Payer: Self-pay | Admitting: Plastic Surgery

## 2021-06-09 DIAGNOSIS — C50412 Malignant neoplasm of upper-outer quadrant of left female breast: Secondary | ICD-10-CM

## 2021-06-09 DIAGNOSIS — Z170 Estrogen receptor positive status [ER+]: Secondary | ICD-10-CM

## 2021-06-09 NOTE — Progress Notes (Signed)
Patient presents 2 weeks out from bilateral breast reconstruction with tissue expander and acellular dermal matrix.  She feels like things are coming along well.  She notes less than 25 cc of drainage per day from each drain so they were all removed today.  I did remove her Steri-Strips and her skin looks to be healing well.  We will plan to give her 1 more week before we start expansion.  All her questions were answered.

## 2021-06-09 NOTE — Telephone Encounter (Signed)
Left message with follow-up appointments per 2/8 los. °

## 2021-06-09 NOTE — Telephone Encounter (Signed)
LVM to schedule consult for Dr. Sondra Come.

## 2021-06-10 ENCOUNTER — Telehealth: Payer: Self-pay | Admitting: Radiation Oncology

## 2021-06-10 ENCOUNTER — Other Ambulatory Visit: Payer: Self-pay

## 2021-06-10 DIAGNOSIS — Z170 Estrogen receptor positive status [ER+]: Secondary | ICD-10-CM

## 2021-06-10 DIAGNOSIS — C50412 Malignant neoplasm of upper-outer quadrant of left female breast: Secondary | ICD-10-CM

## 2021-06-10 NOTE — Progress Notes (Signed)
Reached out via in basket to [employer] with Cardiology for assistance scheduling pt.

## 2021-06-10 NOTE — Telephone Encounter (Signed)
LVM to schedule consult with Dr. Kinard °

## 2021-06-13 NOTE — Progress Notes (Unsigned)
Patient is a 36 year old female s/p bilateral breast reconstruction with tissue expander placement 05/23/2021 by Dr. Claudia Desanctis who presents to clinic for postoperative follow-up.  At time of expander placement, 100 cc saline was infiltrated into each 450 cc expander.  She was last seen here in clinic 06/09/2021 at which point her JP drains as well as her Steri-Strips were removed without complication.  Today,  Expander fill ***

## 2021-06-15 ENCOUNTER — Encounter: Payer: Medicaid Other | Admitting: Physician Assistant

## 2021-06-15 ENCOUNTER — Encounter: Payer: Self-pay | Admitting: *Deleted

## 2021-06-19 ENCOUNTER — Telehealth: Payer: Medicaid Other | Admitting: Family

## 2021-06-19 DIAGNOSIS — M545 Low back pain, unspecified: Secondary | ICD-10-CM | POA: Diagnosis not present

## 2021-06-19 MED ORDER — BACLOFEN 10 MG PO TABS
10.0000 mg | ORAL_TABLET | Freq: Three times a day (TID) | ORAL | 0 refills | Status: DC
  Filled 2021-06-19 – 2021-10-06 (×3): qty 30, 10d supply, fill #0

## 2021-06-19 MED ORDER — NAPROXEN 500 MG PO TABS
500.0000 mg | ORAL_TABLET | Freq: Two times a day (BID) | ORAL | 0 refills | Status: DC
  Filled 2021-06-19 – 2021-08-31 (×2): qty 30, 15d supply, fill #0

## 2021-06-19 NOTE — Progress Notes (Signed)

## 2021-06-20 ENCOUNTER — Other Ambulatory Visit (HOSPITAL_COMMUNITY): Payer: Self-pay

## 2021-06-20 ENCOUNTER — Ambulatory Visit: Payer: Medicaid Other | Attending: Hematology and Oncology | Admitting: Physical Therapy

## 2021-06-20 NOTE — Progress Notes (Unsigned)
Patient is a 36 year old female with PMH of left-sided breast cancer s/p bilateral mastectomy with immediate reconstruction using tissue expanders performed 05/23/2021 by Dr. Claudia Desanctis who presents to clinic for postoperative follow-up.  Patient was last seen here in clinic on 06/09/2021.  At that time, drains were removed along with her Steri-Strips.  Skin appeared to be healing nicely.  Plan was to start expansion at subsequent follow-up.  Today,

## 2021-06-21 ENCOUNTER — Ambulatory Visit: Payer: Medicaid Other | Admitting: Physical Medicine and Rehabilitation

## 2021-06-23 ENCOUNTER — Encounter: Payer: Medicaid Other | Admitting: Physician Assistant

## 2021-06-24 NOTE — Progress Notes (Incomplete)
Location of Breast Cancer: upper-outer quadrant of left breast   ? ?Histology per Pathology Report: Biopsy on 12/17/20 showed invasive ductal carcinoma and DCIS ? ?Receptor Status:  ?Estrogen Receptor: Positive (80%, strong)  ?     Progesterone Receptor: Positive (3%, weak)  ?     Her2: 3+ positive by IHC  ?     Ki-67: 25%  ? ?Did patient present with symptoms (if so, please note symptoms) or was this found on screening mammography?: Palpable lump in the upper outer left breast. ? ?Past/Anticipated interventions by surgeon, if any:  ?Procedure: ?1.  Right risk reduction skin sparing mastectomy ?2.  Left skin sparing mastectomy ?3.  Left deep axillary sentinel lymph node biopsy ?4.  Left radioactive seed axillary node guided excision ?5.  Injection of mag trace for sentinel lymph node identification ?Surgeon: Dr. Serita Grammes ? ?Past/Anticipated interventions by medical oncology, if any: Dr Lindi Adie ?Patient is on Treatment Plan : BREAST  Docetaxel + Carboplatin + Trastuzumab + Pertuzumab  (TCHP) q21d   ? ?Lymphedema issues, if any:  {:18581} {t:21944}  ? ?Pain issues, if any:  {:18581} {PAIN DESCRIPTION:21022940} ? ?SAFETY ISSUES: ?Prior radiation? {:18581} ?Pacemaker/ICD? {:18581} ?Possible current pregnancy?no, tubal ligation, chemotherapy ?Is the patient on methotrexate? {:18581} ? ?Current Complaints / other details:  *** ?   ?*** ? ? ?

## 2021-06-27 ENCOUNTER — Encounter: Payer: Self-pay | Admitting: Emergency Medicine

## 2021-06-27 ENCOUNTER — Other Ambulatory Visit: Payer: Self-pay

## 2021-06-27 ENCOUNTER — Ambulatory Visit
Admission: EM | Admit: 2021-06-27 | Discharge: 2021-06-27 | Disposition: A | Discharge status: Home / Self Care | Payer: Medicaid Other | Attending: Physician Assistant | Admitting: Physician Assistant

## 2021-06-27 DIAGNOSIS — J019 Acute sinusitis, unspecified: Secondary | ICD-10-CM | POA: Diagnosis not present

## 2021-06-27 MED ORDER — AMOXICILLIN-POT CLAVULANATE 875-125 MG PO TABS: 1.0000 | ORAL_TABLET | Freq: Two times a day (BID) | ORAL | 0 refills | Status: DC

## 2021-06-27 NOTE — ED Triage Notes (Signed)
Sore throat, ear pain starting 3 days ago. Daughters have been sick x 5 days. Patient is on chemo/radiation

## 2021-06-27 NOTE — Discharge Instructions (Addendum)
Take antibiotic as prescribed Recommend flonase and Mucinex, Delsym as needed for cough

## 2021-06-27 NOTE — ED Provider Notes (Signed)
[facility]    CSN: [id number] Arrival date & time: 06/27/21  1601      History   Chief Complaint Chief Complaint  Patient presents with   Sore Throat    HPI Robin Horn is a 36 y.o. female.   Pt complains of sore throat, ear pain, congestion, and cough that started several days ago.  Her daughters are sick with similar sx.  Pt is currently undergoing chemo.  She has taken nothing for the sx.  She reports sinus pressure and headache.    Past Medical History:  Diagnosis Date   Abnormal Pap smear of cervix    Anemia    Anxiety    Arthritis    Bipolar disorder ([place])    diagnosed years ago   Cancer ([place])    Chlamydia    Depression    Family history of breast cancer 01/18/2021   GDM (gestational diabetes mellitus)    GERD (gastroesophageal reflux disease)    pt used to take omeprazole, doesn't have much issues with it anymore   Gonorrhea    Herpes genitalia    History of hiatal hernia    Migraines    Ovarian cyst    Polycystic kidney disease    Preeclampsia    Pregnancy induced hypertension     Patient Active Problem List   Diagnosis Date Noted   S/P breast reconstruction 05/23/2021   Encounter for dental examination 04/12/2021   Caries 04/12/2021   Teeth missing 04/12/2021   Accretions on teeth 04/12/2021   Phobia of dental procedure 04/12/2021   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   Genetic testing 02/03/2021   Family history of breast cancer 01/18/2021   Port-A-Cath in place 01/18/2021   Malignant neoplasm of upper-outer quadrant of left breast in female, estrogen receptor positive ([place]) 12/23/2020   Urinary problem 11/15/2020   Hypertension 11/15/2020   Encounter for female sterilization procedure 09/24/2016   Positive GBS test 09/23/2016   Supervision of other high risk pregnancy, antepartum 09/20/2016   Pregnancy affected by fetal growth restriction 09/20/2016   GDM (gestational diabetes  mellitus) 09/11/2016   Gestational hypertension without significant proteinuria 08/29/2016   History of postpartum hemorrhage, currently pregnant 05/25/2016   Drug use affecting pregnancy, antepartum 05/25/2016   Bipolar 1 disorder ([place]) 05/25/2016   Insomnia 05/25/2016   Monochorionic diamniotic twin gestation in third trimester 04/25/2016    Past Surgical History:  Procedure Laterality Date   BREAST RECONSTRUCTION WITH PLACEMENT OF TISSUE EXPANDER AND FLEX HD (ACELLULAR HYDRATED DERMIS) Bilateral 05/23/2021   Procedure: BREAST RECONSTRUCTION WITH PLACEMENT OF TISSUE EXPANDER AND FLEX HD (ACELLULAR HYDRATED DERMIS);  Surgeon: Cindra Presume, MD;  Location: [facility];  Service: Plastics;  Laterality: Bilateral;   COLPOSCOPY     DILATION AND CURETTAGE OF UTERUS     MASTECTOMY W/ SENTINEL NODE BIOPSY Left 05/23/2021   Procedure: LEFT MASTECTOMY WITH LEFT AXILLARY SENTINEL LYMPH NODE BIOPSY;  Surgeon: Rolm Bookbinder, MD;  Location: [facility];  Service: General;  Laterality: Left;   PORT A CATH REVISION Right 01/31/2021   Procedure: PORT A CATH REVISION;  Surgeon: Rolm Bookbinder, MD;  Location: [facility];  Service: General;  Laterality: Right;   PORTACATH PLACEMENT N/A 01/06/2021   Procedure: INSERTION PORT-A-CATH;  Surgeon: Rolm Bookbinder, MD;  Location: [facility];  Service: General;  Laterality: N/A;   RADIOACTIVE SEED GUIDED AXILLARY SENTINEL LYMPH NODE Left 05/23/2021   Procedure: RADIOACTIVE SEED GUIDED LEFT AXILLARY SENTINEL  LYMPH NODE EXCISION;  Surgeon: Rolm Bookbinder, MD;  Location: [facility];  Service: General;  Laterality: Left;   TONSILLECTOMY     TOTAL MASTECTOMY Right 05/23/2021   Procedure: RIGHT TOTAL MASTECTOMY;  Surgeon: Rolm Bookbinder, MD;  Location: [facility];  Service: General;  Laterality: Right;   TUBAL LIGATION Bilateral 09/24/2016   Procedure: POST PARTUM TUBAL LIGATION;  Surgeon: Lavonia Drafts, MD;  Location: [facility];  Service:  Gynecology;  Laterality: Bilateral;    OB History     Gravida  8   Para  5   Term  4   Preterm  1   AB  3   Living  6      SAB  0   IAB  3   Ectopic  0   Multiple  1   Live Births  6            Home Medications    Prior to Admission medications   Medication Sig Start Date End Date Taking? Authorizing Provider  amoxicillin-clavulanate (AUGMENTIN) 875-125 MG tablet Take 1 tablet by mouth every 12 (twelve) hours. 06/27/21  Yes Ward, Lenise Arena, PA-C  acetaminophen (TYLENOL) 500 MG tablet Take 1,000 mg by mouth every 6 (six) hours as needed (pain).    [provider]  baclofen (LIORESAL) 10 MG tablet Take 1 tablet by mouth 3 times daily. 06/19/21   Evelina Dun A, FNP  lidocaine (XYLOCAINE) 2 % solution Use as directed 15 mLs in the mouth or throat every 3 (three) hours as needed for mouth pain. 03/30/21   Walisiewicz, Kaitlyn E, PA-C  lisinopril (ZESTRIL) 10 MG tablet Take 10 mg by mouth daily.    [provider]  naproxen (NAPROSYN) 500 MG tablet Take 1 tablet by mouth 2 times daily with a meal. 06/19/21   Hawks, [name] A, FNP  zolpidem (AMBIEN) 5 MG tablet TAKE 1 TABLET BY MOUTH AT BEDTIME AS NEEDED 05/26/21   [name], MD  prochlorperazine (COMPAZINE) 10 MG tablet Take 1 tablet (10 mg total) by mouth every 6 (six) hours as needed (Nausea or vomiting). 04/05/21 06/08/21  [name], MD    Family History Family History  Problem Relation Age of Onset   Kidney disease Mother    Breast cancer Maternal Aunt        dx 55s   Breast cancer Other        MGM's sister and MGM's niece; dx after 1   Other Neg Hx     Social History Social History   Tobacco Use   Smoking status: Every Day    Packs/day: 0.25    Types: Cigarettes   Smokeless tobacco: Never   Tobacco comments:    Wants to restart Nicotine  Vaping Use   Vaping Use: Never used  Substance Use Topics   Alcohol use: Not Currently    Alcohol/week: 1.0 standard drink    Types: 1  Glasses of wine per week   Drug use: Not Currently     Allergies   Hydrocodone-acetaminophen, Tramadol, and Lactose intolerance (gi)   Review of Systems Review of Systems  Constitutional:  Negative for chills and fever.  HENT:  Positive for congestion, ear pain and sore throat.   Eyes:  Negative for pain and visual disturbance.  Respiratory:  Positive for cough. Negative for shortness of breath.   Cardiovascular:  Negative for chest pain and palpitations.  Gastrointestinal:  Negative for abdominal pain and vomiting.  Genitourinary:  Negative for dysuria  and hematuria.  Musculoskeletal:  Negative for arthralgias and back pain.  Skin:  Negative for color change and rash.  Neurological:  Negative for seizures and syncope.  All other systems reviewed and are negative.   Physical Exam Triage Vital Signs ED Triage Vitals [06/27/21 1719]  Enc Vitals Group     BP (!) 148/84     Pulse Rate 96     Resp 16     Temp 98 F (36.7 C)     Temp Source Oral     SpO2 98 %     Weight      Height      Head Circumference      Peak Flow      Pain Score 5     Pain Loc      Pain Edu?      Excl. in [place]?    No data found.  Updated Vital Signs BP (!) 148/84 (BP Location: Right Arm)    Pulse 96    Temp 98 F (36.7 C) (Oral)    Resp 16    SpO2 98%   Visual Acuity Right Eye Distance:   Left Eye Distance:   Bilateral Distance:    Right Eye Near:   Left Eye Near:    Bilateral Near:     Physical Exam Vitals and nursing note reviewed.  Constitutional:      General: She is not in acute distress.    Appearance: She is well-developed.  HENT:     Head: Normocephalic and atraumatic.  Eyes:     Conjunctiva/sclera: Conjunctivae normal.  Cardiovascular:     Rate and Rhythm: Normal rate and regular rhythm.     Heart sounds: No murmur heard. Pulmonary:     Effort: Pulmonary effort is normal. No respiratory distress.     Breath sounds: Normal breath sounds.  Abdominal:     Palpations:  Abdomen is soft.     Tenderness: There is no abdominal tenderness.  Musculoskeletal:        General: No swelling.     Cervical back: Neck supple.  Skin:    General: Skin is warm and dry.     Capillary Refill: Capillary refill takes less than 2 seconds.  Neurological:     Mental Status: She is alert.  Psychiatric:        Mood and Affect: Mood normal.     UC Treatments / Results  Labs (all labs ordered are listed, but only abnormal results are displayed) Labs Reviewed - No data to display  EKG   Radiology No results found.  Procedures Procedures (including critical care time)  Medications Ordered in UC Medications - No data to display  Initial Impression / Assessment and Plan / UC Course  I have reviewed the triage vital signs and the nursing notes.  Pertinent labs & imaging results that were available during my care of the patient were reviewed by me and considered in my medical decision making (see chart for details).     Acute sinusitis, antibiotic prescribed.  Supportive care discussed.  Return precautions discussed.  Final Clinical Impressions(s) / UC Diagnoses   Final diagnoses:  Acute non-recurrent sinusitis, unspecified location     Discharge Instructions      Take antibiotic as prescribed Recommend flonase and Mucinex, Delsym as needed for cough   ED Prescriptions     Medication Sig Dispense Auth. Provider   amoxicillin-clavulanate (AUGMENTIN) 875-125 MG tablet Take 1 tablet by mouth every 12 (twelve) hours.  14 tablet Ward, Lenise Arena, PA-C      PDMP not reviewed this encounter.   Ward, Lenise Arena, PA-C 06/27/21 [phone]

## 2021-06-28 ENCOUNTER — Other Ambulatory Visit (HOSPITAL_COMMUNITY): Payer: Self-pay

## 2021-06-28 NOTE — Progress Notes (Incomplete)
Radiation Oncology         [phone] ________________________________  Initial Outpatient Consultation  Name: Robin Horn MRN: [id number]  Date: 06/29/2021  DOB: Jul 22, 1985  EV:OJJK, [name], DO  [name], MD   REFERRING PHYSICIAN: [name], MD  DIAGNOSIS: There were no encounter diagnoses.  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 female who is accompanied by ***. she is seen as a courtesy of Dr. Lindi Adie for an opinion concerning radiation therapy as part of management for her diagnosed left breast cancer.   The patient initially presented with a palpable lump in the upper outer left breast. Subsequent diagnostic mammogram and [place] on 12/15/20 showed multiple suspicious masses in the upper outer left breast, 2 abnormal lymph nodes with increased cortical thickness, 1 lymph node with borderline abnormal cortical thickness, and no mammographic evidence of malignancy in the right breast.   Left breast biopsies at the 1 o'clock, 12:30 o'clock, 2 o'clock positions and left axillary lymph node biopsy on 12/17/20 revealed grade 3 invasive ductal carcinoma from all 4 biopsies measuring 1.4 cm in the greatest linear extent, in addition to DCIS from the 2 o'clock biopsy. Prognostic indicators significant for: ER status 80% positive with strong staining intensity; PR status negative; Her2 status positive; Proliferation marker Ki67 at 25%; Grade 3.  Bilateral breast MRI on 01/08/21 demonstrated 3 dominant spiculated masses in the upper-outer quadrant of the left breast, with interconnecting linear and nodular enhancement measuring up to 7.4 cm. Three abnormal left axillary lymph nodes were also appreciated. (No abnormal enhancements were appreciated in the right breast).  The patient proceeded to  under neoadjuvant chemotherapy consisting of Loleta Perjeta 6 cycles on 01/11/21 through 04/26/21 under the care of Dr. Lindi Adie. The patient overall tolerated systemic treatment well other than mild fatigue. She is currently on Herceptin Perjeta maintenance.  Genetic testing performed on 01/31/21 showed no pathogenic variants detected by +RNAinsight testing.   Bilateral breast MRI on 05/03/21 demonstrated: complete interval resolution of masses and non mass enhancement in the outer portion of the left breast; asymmetric flattening of the left nipple compared to the right; an enlarged left axillary lymph node with normal morphology; and a probably benign 7 millimeter mass in the left hepatic lobe  The patient opted to proceed with bilateral mastectomies and nodal biopsies on 05/23/21 under the care of Dr. Donne Hazel. Pathology from the procedure revealed extensive residual high-grade ductal carcinoma in situ in the left breast without residual tumor, and no evidence of residual invasive carcinoma. Nodal status of 4/4 left axillary SLN excisions negative for carcinoma. Prognostic indicators significant for: ER status 80% positive with strong staining intensity; PR status 3% positive with weak staining intensity; Her2 status positive; Proliferation marker Ki67 at 25%. Right breast mastectomy revealed benign findings. The patient also underwent bilateral breast reconstructions following mastectomies.    PREVIOUS RADIATION THERAPY: {EXAM; YES/NO:19492::"No"}  PAST MEDICAL HISTORY:  Past Medical History:  Diagnosis Date   Abnormal Pap smear of cervix    Anemia    Anxiety    Arthritis    Bipolar disorder ([place])    diagnosed years ago   Cancer ([place])    Chlamydia    Depression    Family history of breast cancer 01/18/2021   GDM (gestational diabetes mellitus)    GERD (gastroesophageal reflux  disease)    pt used to take omeprazole, doesn't have much issues with it anymore   Gonorrhea    Herpes genitalia     History of hiatal hernia    Migraines    Ovarian cyst    Polycystic kidney disease    Preeclampsia    Pregnancy induced hypertension     PAST SURGICAL HISTORY: Past Surgical History:  Procedure Laterality Date   BREAST RECONSTRUCTION WITH PLACEMENT OF TISSUE EXPANDER AND FLEX HD (ACELLULAR HYDRATED DERMIS) Bilateral 05/23/2021   Procedure: BREAST RECONSTRUCTION WITH PLACEMENT OF TISSUE EXPANDER AND FLEX HD (ACELLULAR HYDRATED DERMIS);  Surgeon: Cindra Presume, MD;  Location: [place];  Service: Plastics;  Laterality: Bilateral;   COLPOSCOPY     DILATION AND CURETTAGE OF UTERUS     MASTECTOMY W/ SENTINEL NODE BIOPSY Left 05/23/2021   Procedure: LEFT MASTECTOMY WITH LEFT AXILLARY SENTINEL LYMPH NODE BIOPSY;  Surgeon: Rolm Bookbinder, MD;  Location: [place];  Service: General;  Laterality: Left;   PORT A CATH REVISION Right 01/31/2021   Procedure: PORT A CATH REVISION;  Surgeon: Rolm Bookbinder, MD;  Location: [place];  Service: General;  Laterality: Right;   PORTACATH PLACEMENT N/A 01/06/2021   Procedure: INSERTION PORT-A-CATH;  Surgeon: Rolm Bookbinder, MD;  Location: [place];  Service: General;  Laterality: N/A;   RADIOACTIVE SEED GUIDED AXILLARY SENTINEL LYMPH NODE Left 05/23/2021   Procedure: RADIOACTIVE SEED GUIDED LEFT AXILLARY SENTINEL LYMPH NODE EXCISION;  Surgeon: Rolm Bookbinder, MD;  Location: [place];  Service: General;  Laterality: Left;   TONSILLECTOMY     TOTAL MASTECTOMY Right 05/23/2021   Procedure: RIGHT TOTAL MASTECTOMY;  Surgeon: Rolm Bookbinder, MD;  Location: [place];  Service: General;  Laterality: Right;   TUBAL LIGATION Bilateral 09/24/2016   Procedure: POST PARTUM TUBAL LIGATION;  Surgeon: Lavonia Drafts, MD;  Location: [place];  Service: Gynecology;  Laterality: Bilateral;    FAMILY HISTORY:  Family History  Problem Relation Age of Onset   Kidney disease Mother    Breast cancer Maternal Aunt        dx 2s   Breast  cancer Other        MGM's sister and MGM's niece; dx after 45   Other Neg Hx     SOCIAL HISTORY:  Social History   Tobacco Use   Smoking status: Every Day    Packs/day: 0.25    Types: Cigarettes   Smokeless tobacco: Never   Tobacco comments:    Wants to restart Nicotine  Vaping Use   Vaping Use: Never used  Substance Use Topics   Alcohol use: Not Currently    Alcohol/week: 1.0 standard drink    Types: 1 Glasses of wine per week   Drug use: Not Currently    ALLERGIES:  Allergies  Allergen Reactions   Hydrocodone-Acetaminophen Hives   Tramadol Itching   Lactose Intolerance (Gi) Diarrhea    MEDICATIONS:  Current Outpatient Medications  Medication Sig Dispense Refill   acetaminophen (TYLENOL) 500 MG tablet Take 1,000 mg by mouth every 6 (six) hours as needed (pain).     amoxicillin-clavulanate (AUGMENTIN) 875-125 MG tablet Take 1 tablet by mouth every 12 (twelve) hours. 14 tablet 0   baclofen (LIORESAL) 10 MG tablet Take 1 tablet by mouth 3 times daily. 30 each 0   lidocaine (XYLOCAINE) 2 % solution Use as directed 15 mLs in the mouth or throat every 3 (three) hours as needed for mouth pain. 100 mL 0  lisinopril (ZESTRIL) 10 MG tablet Take 10 mg by mouth daily.     naproxen (NAPROSYN) 500 MG tablet Take 1 tablet by mouth 2 times daily with a meal. 30 tablet 0   zolpidem (AMBIEN) 5 MG tablet TAKE 1 TABLET BY MOUTH AT BEDTIME AS NEEDED 30 tablet 0   No current facility-administered medications for this encounter.    REVIEW OF SYSTEMS:  A 10+ POINT REVIEW OF SYSTEMS WAS OBTAINED including neurology, dermatology, psychiatry, cardiac, respiratory, lymph, extremities, GI, GU, musculoskeletal, constitutional, reproductive, HEENT. ***   PHYSICAL EXAM:  vitals were not taken for this visit.   General: Alert and oriented, in no acute distress HEENT: Head is normocephalic. Extraocular movements are intact. Oropharynx is clear. Neck: Neck is supple, no palpable cervical or  supraclavicular lymphadenopathy. Heart: Regular in rate and rhythm with no murmurs, rubs, or gallops. Chest: Clear to auscultation bilaterally, with no rhonchi, wheezes, or rales. Abdomen: Soft, nontender, nondistended, with no rigidity or guarding. Extremities: No cyanosis or edema. Lymphatics: see Neck Exam Skin: No concerning lesions. Musculoskeletal: symmetric strength and muscle tone throughout. Neurologic: Cranial nerves II through XII are grossly intact. No obvious focalities. Speech is fluent. Coordination is intact. Psychiatric: Judgment and insight are intact. Affect is appropriate.  Right Breast: no palpable mass, nipple discharge or bleeding. Left Breast: ***  ECOG = ***  0 - Asymptomatic (Fully active, able to carry on all predisease activities without restriction)  1 - Symptomatic but completely ambulatory (Restricted in physically strenuous activity but ambulatory and able to carry out work of a light or sedentary nature. For example, light housework, office work)  2 - Symptomatic, <50% in bed during the day (Ambulatory and capable of all self care but unable to carry out any work activities. Up and about more than 50% of waking hours)  3 - Symptomatic, >50% in bed, but not bedbound (Capable of only limited self-care, confined to bed or chair 50% or more of waking hours)  4 - Bedbound (Completely disabled. Cannot carry on any self-care. Totally confined to bed or chair)  5 - Death   Eustace Pen MM, Creech RH, Tormey DC, et al. [phone]). "Toxicity and response criteria of the [facility]". [place] Oncol. 5 (6): 649-55  LABORATORY DATA:  Lab Results  Component Value Date   WBC 5.3 06/08/2021   HGB 8.5 (L) 06/08/2021   HCT 26.9 (L) 06/08/2021   MCV 94.4 06/08/2021   PLT 332 06/08/2021   NEUTROABS 3.2 06/08/2021   Lab Results  Component Value Date   NA 139 06/08/2021   K 3.8 06/08/2021   CL 104 06/08/2021   CO2 29 06/08/2021   GLUCOSE 88  06/08/2021   CREATININE 1.28 (H) 06/08/2021   CALCIUM 9.0 06/08/2021      RADIOGRAPHY: ECHOCARDIOGRAM COMPLETE  Result Date: 06/08/2021    ECHOCARDIOGRAM REPORT   Patient Name:   KATRENIA ALKINS Date of Exam: 06/08/2021 Medical Rec #:  [id number]       Height:       62.0 in Accession #:    [id number]      Weight:       152.0 lb Date of Birth:  June 08, 1985       BSA:          1.701 m Patient Age:    35 years        BP:           114/63 mmHg Patient Gender: F  HR:           89 bpm. Exam Location:  Outpatient Procedure: 2D Echo, 3D Echo, Cardiac Doppler, Color Doppler and Strain Analysis Indications:    Chemo Z09  History:        Patient has prior history of Echocardiogram examinations, most                 recent 01/10/2021. Risk Factors:Hypertension. Breast Cancer.  Sonographer:    Darlina Sicilian RDCS Referring Phys: [place]  1. Left ventricular ejection fraction, by estimation, is 50 to 55%. Left ventricular ejection fraction by 3D volume is 50 %. The left ventricle has low normal function. The left ventricle demonstrates regional wall motion abnormalities (see scoring diagram/findings for description). Left ventricular diastolic parameters are consistent with Grade I diastolic dysfunction (impaired relaxation). There is mild hypokinesis of the left ventricular, apical apical segment and apical lateral. The average left ventricular global longitudinal strain is -16.2 %. The global longitudinal strain is abnormal, particularly the apical lateral segment.  2. Right ventricular systolic function is normal. The right ventricular size is normal. There is normal pulmonary artery systolic pressure. The estimated right ventricular systolic pressure is 54.0 mmHg.  3. The mitral valve is abnormal. Trivial mitral valve regurgitation.  4. The aortic valve is tricuspid. Aortic valve regurgitation is not visualized.  5. The inferior vena cava is normal in size with greater than 50%  respiratory variability, suggesting right atrial pressure of 3 mmHg. Comparison(s): Changes from prior study are noted. 01/10/2021: LVEF 55-60%, GLS -19.1%. FINDINGS  Left Ventricle: Left ventricular ejection fraction, by estimation, is 50 to 55%. Left ventricular ejection fraction by 3D volume is 50 %. The left ventricle has low normal function. The left ventricle demonstrates regional wall motion abnormalities. Mild hypokinesis of the left ventricular, apical apical segment and apical lateral. The average left ventricular global longitudinal strain is -16.2 %. The global longitudinal strain is abnormal. The left ventricular internal cavity size was normal in size. There is no left ventricular hypertrophy. Left ventricular diastolic parameters are consistent with Grade I diastolic dysfunction (impaired relaxation). Indeterminate filling pressures. Right Ventricle: The right ventricular size is normal. No increase in right ventricular wall thickness. Right ventricular systolic function is normal. There is normal pulmonary artery systolic pressure. The tricuspid regurgitant velocity is 2.29 m/s, and  with an assumed right atrial pressure of 3 mmHg, the estimated right ventricular systolic pressure is 08.6 mmHg. Left Atrium: Left atrial size was normal in size. Right Atrium: Right atrial size was normal in size. Pericardium: There is no evidence of pericardial effusion. Mitral Valve: The mitral valve is abnormal. There is mild thickening of the mitral valve leaflet(s). Trivial mitral valve regurgitation. Tricuspid Valve: The tricuspid valve is grossly normal. Tricuspid valve regurgitation is trivial. Aortic Valve: The aortic valve is tricuspid. Aortic valve regurgitation is not visualized. Pulmonic Valve: The pulmonic valve was normal in structure. Pulmonic valve regurgitation is not visualized. Aorta: The aortic root and ascending aorta are structurally normal, with no evidence of dilitation. Venous: The inferior vena  cava is normal in size with greater than 50% respiratory variability, suggesting right atrial pressure of 3 mmHg. IAS/Shunts: No atrial level shunt detected by color flow Doppler.  LEFT VENTRICLE PLAX 2D LVIDd:         4.60 cm         Diastology LVIDs:         2.80 cm  LV e' medial:    6.48 cm/s LV PW:         0.90 cm         LV E/e' medial:  11.2 LV IVS:        0.80 cm         LV e' lateral:   10.40 cm/s LVOT diam:     2.10 cm         LV E/e' lateral: 7.0 LV SV:         39 LV SV Index:   23              2D LVOT Area:     3.46 cm        Longitudinal                                Strain                                2D Strain GLS  -16.8 %                                (A2C):                                2D Strain GLS  -14.2 %                                (A3C):                                2D Strain GLS  -17.7 %                                (A4C):                                2D Strain GLS  -16.2 %                                Avg:                                 3D Volume EF                                LV 3D EF:    Left                                             ventricul                                             ar  ejection                                             fraction                                             by 3D                                             volume is                                             50 %.                                 3D Volume EF:                                3D EF:        50 %                                LV EDV:       120 ml                                LV ESV:       60 ml                                LV SV:        60 ml RIGHT VENTRICLE RV S prime:     15.10 cm/s TAPSE (M-mode): 2.0 cm LEFT ATRIUM             Index        RIGHT ATRIUM          Index LA diam:        2.50 cm 1.47 cm/m   RA Area:     8.69 cm LA Vol (A2C):   36.3 ml 21.34 ml/m  RA Volume:   16.00 ml 9.40 ml/m LA Vol (A4C):    32.1 ml 18.87 ml/m LA Biplane Vol: 34.5 ml 20.28 ml/m  AORTIC VALVE LVOT Vmax:   67.20 cm/s LVOT Vmean:  47.700 cm/s LVOT VTI:    0.114 m  AORTA Ao Root diam: 3.10 cm Ao Asc diam:  3.00 cm MITRAL VALVE               TRICUSPID VALVE MV Area (PHT): 3.60 cm    TR Peak grad:   21.0 mmHg MV Decel Time: 211 msec    TR Vmax:        229.00 cm/s MV E velocity: 72.40 cm/s MV A velocity: 60.00 cm/s  SHUNTS MV E/A ratio:  1.21  Systemic VTI:  0.11 m                            Systemic Diam: 2.10 cm Lyman Bishop MD Electronically signed by Lyman Bishop MD Signature Date/Time: 06/08/2021/10:36:01 AM    Final       IMPRESSION: S/p chemotherapy and mastectomy: Stage 0 Left Breast UOQ, 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  We discussed the natural history of *** and general treatment, highlighting the role of radiotherapy in the management.  We discussed the available radiation techniques, and focused on the details of logistics and delivery.  We reviewed the anticipated acute and late sequelae associated with radiation in this setting.  The patient was encouraged to ask questions that I answered to the best of my ability. *** A patient consent form was discussed and signed.  We retained a copy for our records.  The patient would like to proceed with radiation and will be scheduled for CT simulation.  PLAN: ***    *** minutes of total time was spent for this patient encounter, including preparation, face-to-face counseling with the patient and coordination of care, physical exam, and documentation of the encounter.   ------------------------------------------------  Blair Promise, PhD, MD  This document serves as a record of services personally performed by Gery Pray, MD. It was created on his behalf by Roney Mans, a trained  medical scribe. The creation of this record is based on the scribe's personal observations and the provider's statements to them. This document has been checked and approved by the attending provider.

## 2021-06-29 ENCOUNTER — Inpatient Hospital Stay: Admission: RE | Admit: 2021-06-29 | Payer: Medicaid Other | Source: Ambulatory Visit | Admitting: Radiation Oncology

## 2021-06-29 ENCOUNTER — Encounter: Payer: Self-pay | Admitting: *Deleted

## 2021-06-29 ENCOUNTER — Ambulatory Visit
Admission: RE | Admit: 2021-06-29 | Discharge: 2021-06-29 | Disposition: A | Discharge status: Home / Self Care | Payer: Medicaid Other | Source: Ambulatory Visit | Attending: Radiation Oncology | Admitting: Radiation Oncology

## 2021-06-29 ENCOUNTER — Ambulatory Visit: Payer: Medicaid Other

## 2021-06-29 DIAGNOSIS — Z419 Encounter for procedure for purposes other than remedying health state, unspecified: Secondary | ICD-10-CM | POA: Diagnosis not present

## 2021-06-30 ENCOUNTER — Inpatient Hospital Stay: Payer: Medicaid Other

## 2021-06-30 ENCOUNTER — Telehealth: Payer: Self-pay | Admitting: *Deleted

## 2021-06-30 NOTE — Progress Notes (Signed)
? ? ?Patient did not show for appt. Note left for templating purposes only  ? ? ?CARDIO-ONCOLOGY CLINIC CONSULT NOTE ? ?Referring Physician: ?Primary Care: ?Primary Cardiologist: ? ?HPI:  Robin Horn is a 36 y.o. female with obesity and bipolar d/o. Diagnosed with left breast cancer in 8/22 referred by Dr. Lindi Adie for enrollment into the Cardio-Oncology program. ? ? ?Diagnosed L breast CA in 8/22. Stage IB. HER2+/ER +. PR - in breast but + in lymph node ? ?1/23: s/p bilateral mastectomy with reconstruction.  ? ?9/22: Chemo started.  Docetaxel + Carboplatin + Trastuzumab + Pertuzumab  (TCHP) q21d   ? ?Denies any h/o heart disease. Now getting H/P q 3 weeks.  ? ? ?Echo 9/22: EF 55-60% -19.1% ?Echo 06/08/21: EF 50-55% GLS -15.2% ? ? ?Review of Systems: [y] = yes, _0  = no  ? ?General: Weight gain _1 ; Weight loss _2 ; Anorexia _3 ; Fatigue _4 ; Fever _5 ; Chills _6 ; Weakness _7   ?Cardiac: Chest pain/pressure _8 ; Resting SOB _9 ; Exertional SOB _10 ; Orthopnea _11 ; Pedal Edema _12 ; Palpitations _13 ; Syncope _14 ; Presyncope _15 ; Paroxysmal nocturnal dyspnea_16   ?Pulmonary: Cough _17 ; Wheezing_18 ; Hemoptysis_19 ; Sputum _20 ; Snoring _21   ?GI: Vomiting_22 ; Dysphagia_23 ; Melena_24 ; Hematochezia _25 ; Heartburn_26 ; Abdominal pain _27 ; Constipation _28 ; Diarrhea _29 ; BRBPR _30   ?GU: Hematuria_31 ; Dysuria _32 ; Nocturia_33   ?Vascular: Pain in legs with walking _34 ; Pain in feet with lying flat _35 ; Non-healing sores _36 ; Stroke _37 ; TIA _38 ; Slurred speech _39 ;  ?Neuro: Headaches_40 ; Vertigo_41 ; Seizures_42 ; Paresthesias_43 ;Blurred vision _44 ; Diplopia _45 ; Vision changes _46   ?Ortho/Skin: Arthritis _47 ; Joint pain _48 ; Muscle pain _49 ; Joint swelling _50 ; Back Pain _51 ; Rash _52   ?Psych: Depression_53 ; Anxiety_54   ?Heme: Bleeding problems _55 ; Clotting disorders _56 ; Anemia _57   ?Endocrine: Diabetes _58 ; Thyroid dysfunction_59  ? ? ?Past Medical History:  ?Diagnosis Date  ? Abnormal Pap smear of cervix   ? Anemia   ? Anxiety   ?  Arthritis   ? Bipolar disorder ([place])   ? diagnosed years ago  ? Cancer [facility])   ? Chlamydia   ? Depression   ? Family history of breast cancer 01/18/2021  ? GDM (gestational diabetes mellitus)   ? GERD (gastroesophageal reflux disease)   ? pt used to take omeprazole, doesn't have much issues with it anymore  ? Gonorrhea   ? Herpes genitalia   ? History of hiatal hernia   ? Migraines   ? Ovarian cyst   ? Polycystic kidney disease   ? Preeclampsia   ? Pregnancy induced hypertension   ? ? ?Current Outpatient Medications  ?Medication Sig Dispense Refill  ? acetaminophen (TYLENOL) 500 MG tablet Take 1,000 mg by mouth every 6 (six) hours as needed (pain).    ? amoxicillin-clavulanate (AUGMENTIN) 875-125 MG tablet Take 1 tablet by mouth every 12 (twelve) hours. 14 tablet 0  ? baclofen (LIORESAL) 10 MG tablet Take 1 tablet by mouth 3 times daily. 30 each 0  ? lidocaine (XYLOCAINE) 2 % solution Use as directed 15 mLs in the mouth or throat every 3 (three) hours as needed for mouth pain. 100 mL 0  ? lisinopril (ZESTRIL) 10 MG tablet Take 10 mg by mouth daily.    ?  naproxen (NAPROSYN) 500 MG tablet Take 1 tablet by mouth 2 times daily with a meal. 30 tablet 0  ? zolpidem (AMBIEN) 5 MG tablet TAKE 1 TABLET BY MOUTH AT BEDTIME AS NEEDED 30 tablet 0  ? ?No current facility-administered medications for this encounter.  ? ? ?Allergies  ?Allergen Reactions  ? Hydrocodone-Acetaminophen Hives  ? Tramadol Itching  ? Lactose Intolerance (Gi) Diarrhea  ? ? ?  ?Social History  ? ?Socioeconomic History  ? Marital status: Single  ?  Spouse name: Not on file  ? Number of children: Not on file  ? Years of education: Not on file  ? Highest education level: Not on file  ?Occupational History  ? Not on file  ?Tobacco Use  ? Smoking status: Every Day  ?  Packs/day: 0.25  ?  Types: Cigarettes  ? Smokeless tobacco: Never  ? Tobacco comments:  ?  Wants to restart Nicotine  ?Vaping Use  ? Vaping Use: Never used  ?Substance and Sexual Activity  ?  Alcohol use: Not Currently  ?  Alcohol/week: 1.0 standard drink  ?  Types: 1 Glasses of wine per week  ? Drug use: Not Currently  ? Sexual activity: Yes  ?  Birth control/protection: None, Surgical  ?  Comment: Tubal  ?Other Topics Concern  ? Not on file  ?Social History Narrative  ? Not on file  ? ?Social Determinants of Health  ? ?Financial Resource Strain: Not on file  ?Food Insecurity: Not on file  ?Transportation Needs: Not on file  ?Physical Activity: Not on file  ?Stress: Not on file  ?Social Connections: Not on file  ?Intimate Partner Violence: Not on file  ? ? ?  ?Family History  ?Problem Relation Age of Onset  ? Kidney disease Mother   ? Breast cancer Maternal Aunt   ?     dx 22s  ? Breast cancer Other   ?     MGM's sister and MGM's niece; dx after 50  ? Other Neg Hx   ? ? ?There were no vitals filed for this visit. ? ?PHYSICAL EXAM: ?General:  Well appearing. No respiratory difficulty ?HEENT: normal ?Neck: supple. no JVD. Carotids 2+ bilat; no bruits. No lymphadenopathy or thryomegaly appreciated. ?Cor: PMI nondisplaced. Regular rate & rhythm. No rubs, gallops or murmurs. ?Lungs: clear ?Abdomen: soft, nontender, nondistended. No hepatosplenomegaly. No bruits or masses. Good bowel sounds. ?Extremities: no cyanosis, clubbing, rash, edema ?Neuro: alert & oriented x 3, cranial nerves grossly intact. moves all 4 extremities w/o difficulty. Affect pleasant. ? ?ECG: ? ? ?ASSESSMENT & PLAN: ? ?1. Breast CA, left ?- diagnosed 8/22  Stage IB. HER2+/ER +. PR - in breast but + in lymph node ?- 1/23 s/p bilateral mastectomy with reconstruction. ?- 9/22: Chemo started.  Docetaxel + Carboplatin + Trastuzumab + Pertuzumab  (TCHP) q21d. Now getting H/P q 3 weeks.  ?- Echo 9/22: EF 55-60% -19.1% ?- Echo 06/08/21: EF 50-55% GLS -15.2% ?- Explained incidence of Herceptin cardiotoxicity and role of Cardio-oncology clinic at length. Echo images reviewed personally. All parameters stable. Reviewed signs and symptoms of HF to look  for. Continue Herceptin. Follow-up with echo in 3 months. ? ?Glori Bickers, MD  ?10:41 PM ? ?  ?

## 2021-06-30 NOTE — Telephone Encounter (Signed)
RN contacted pt regarding missed infusion appt today.  Pt states she does not want to come in today and does not want to re-scheduled missed appt at this time.   RN verified upcoming appt on 07/21/21 and pt states will will be coming to that. RN encouraged pt to call our office if she does want to reschedule missed appt.  Pt verbalized understanding.  ?

## 2021-07-01 ENCOUNTER — Inpatient Hospital Stay (HOSPITAL_COMMUNITY)
Admission: RE | Admit: 2021-07-01 | Discharge: 2021-07-01 | Disposition: A | Discharge status: Home / Self Care | Payer: Medicaid Other | Source: Ambulatory Visit | Attending: Internal Medicine | Admitting: Internal Medicine

## 2021-07-05 ENCOUNTER — Encounter: Payer: Self-pay | Admitting: *Deleted

## 2021-07-06 ENCOUNTER — Encounter (HOSPITAL_COMMUNITY): Payer: Self-pay

## 2021-07-07 ENCOUNTER — Encounter: Payer: Self-pay | Admitting: *Deleted

## 2021-07-12 ENCOUNTER — Other Ambulatory Visit: Payer: Self-pay | Admitting: Hematology and Oncology

## 2021-07-12 MED ORDER — ZOLPIDEM TARTRATE 5 MG PO TABS: 5.0000 mg | ORAL_TABLET | Freq: Every evening | ORAL | 1 refills | Status: DC | PRN

## 2021-07-20 ENCOUNTER — Other Ambulatory Visit: Payer: Self-pay | Admitting: *Deleted

## 2021-07-20 DIAGNOSIS — C50412 Malignant neoplasm of upper-outer quadrant of left female breast: Secondary | ICD-10-CM

## 2021-07-20 NOTE — Progress Notes (Incomplete)
? ?Patient Care Team: ?[name], DO as PCP - General (Internal Medicine) ?Claudia Pollock, RN as Registered Nurse ?Dayna Barker, MD as Consulting Physician (General Surgery) ?Mauro Kaufmann, RN as Oncology Nurse Navigator ?Rockwell Germany, RN as Oncology Nurse Navigator ? ?DIAGNOSIS: No diagnosis found. ? ?SUMMARY OF ONCOLOGIC HISTORY: ?Oncology History  ?Left breast lump (Resolved)  ?Malignant neoplasm of upper-outer quadrant of left breast in female, estrogen receptor positive ([place])  ?12/17/2020 Initial Diagnosis  ? Palpable lump in the upper outer left breast. 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, Her2+ (3+)/ER+ (80%)/PR- (<1%) in the left breast, and invasive ductal carcinoma, Her2+ (3+)/ER+ (80%)/PR+ (3%) in the left axillary lymph node ?  ?12/23/2020 Cancer Staging  ? Staging form: Breast, AJCC 8th Edition ?- Clinical stage from 12/23/2020: Stage IB (cT2, cN1, cM0, G3, ER+, PR+, HER2+) - Signed by [name], MD on 12/23/2020 ?Stage prefix: Initial diagnosis ?Histologic grading system: 3 grade system ? ?  ?01/11/2021 - 04/29/2021 Chemotherapy  ? Patient is on Treatment Plan : BREAST  Docetaxel + Carboplatin + Trastuzumab + Pertuzumab  (TCHP) q21d   ?   ?01/31/2021 Genetic Testing  ? Negative hereditary cancer genetic testing: no pathogenic variants detected in [facility] Common Hereditary Cancers +RNA Panel.  The report date is January 31, 2021.  ? ? The Common Hereditary Cancers + RNA Panel offered by [facility] includes sequencing, deletion/duplication, and RNA testing of the following 47 genes: APC, ATM, AXIN2, BARD1, BMPR1A, BRCA1, BRCA2, BRIP1, CDH1, CDK4*, CDKN2A (p14ARF)*, CDKN2A (p16INK4a)*, CHEK2, CTNNA1, DICER1, EPCAM (Deletion/duplication testing only), GREM1 (promoter region  deletion/duplication testing only), KIT, MEN1, MLH1, MSH2, MSH3, MSH6, MUTYH, NBN, NF1, NHTL1, PALB2, PDGFRA*, PMS2, POLD1, POLE, PTEN, RAD50, RAD51C, RAD51D, SDHB, SDHC, SDHD, SMAD4, SMARCA4. STK11, TP53, TSC1, TSC2, and VHL.  The following genes were evaluated for sequence changes only: SDHA and HOXB13 c.251G>A variant only.  RNA analysis is not performed for the * genes.   ?  ?05/23/2021 Surgery  ? Left mastectomy: Negative for residual cancer.  Extensive residual high-grade DCIS, 0/4 lymph nodes negative ?Right mastectomy: Benign ?Complete pathologic response ?  ?06/08/2021 -  Chemotherapy  ? Patient is on Treatment Plan : BREAST Trastuzumab  + Pertuzumab q21d x 13 cycles  ?   ? ? ?CHIEF COMPLIANT: Follow-up after surgery ? ? ?INTERVAL HISTORY: Robin Horn is a 36 y.o. with above-mentioned history of left breast cancer, completed chemotherapy with TCHP. She presents to the clinic today for follow-up after having undergone breast surgery ? ? ?ALLERGIES:  is allergic to hydrocodone-acetaminophen, tramadol, and lactose intolerance (gi). ? ?MEDICATIONS:  ?Current Outpatient Medications  ?Medication Sig Dispense Refill  ? acetaminophen (TYLENOL) 500 MG tablet Take 1,000 mg by mouth every 6 (six) hours as needed (pain).    ? amoxicillin-clavulanate (AUGMENTIN) 875-125 MG tablet Take 1 tablet by mouth every 12 (twelve) hours. 14 tablet 0  ? baclofen (LIORESAL) 10 MG tablet Take 1 tablet by mouth 3 times daily. 30 each 0  ? lidocaine (XYLOCAINE) 2 % solution Use as directed 15 mLs in the mouth or throat every 3 (three) hours as needed for mouth pain. 100 mL 0  ? lisinopril (ZESTRIL) 10 MG tablet Take 10 mg by mouth daily.    ? naproxen (NAPROSYN) 500 MG tablet Take 1 tablet by mouth 2 times daily  with a meal. 30 tablet 0  ? zolpidem (AMBIEN) 5 MG tablet Take 1 tablet (5 mg total) by mouth at bedtime as needed. 30 tablet 1  ? ?No current facility-administered medications for this visit.  ? ? ?PHYSICAL EXAMINATION: ?ECOG  PERFORMANCE STATUS: {CHL ONC ECOG WK:[id number]} ? ?There were no vitals filed for this visit. ?There were no vitals filed for this visit. ? ?BREAST:*** No palpable masses or nodules in either right or left breasts. No palpable axillary supraclavicular or infraclavicular adenopathy no breast tenderness or nipple discharge. (exam performed in the presence of a chaperone) ? ?LABORATORY DATA:  ?I have reviewed the data as listed ? ?  Latest Ref Rng & Units 06/08/2021  ? 10:18 AM 05/17/2021  ?  4:00 PM 04/26/2021  ?  9:12 AM  ?CMP  ?Glucose 70 - 99 mg/dL 88   94   90    ?BUN 6 - 20 mg/dL 10   13   14     ?Creatinine 0.44 - 1.00 mg/dL 1.28   1.11   1.30    ?Sodium 135 - 145 mmol/L 139   137   141    ?Potassium 3.5 - 5.1 mmol/L 3.8   3.6   3.6    ?Chloride 98 - 111 mmol/L 104   101   108    ?CO2 22 - 32 mmol/L 29   28   26     ?Calcium 8.9 - 10.3 mg/dL 9.0   9.2   8.9    ?Total Protein 6.5 - 8.1 g/dL 6.7    6.3    ?Total Bilirubin 0.3 - 1.2 mg/dL 0.2    0.2    ?Alkaline Phos 38 - 126 U/L 63    61    ?AST 15 - 41 U/L 12    11    ?ALT 0 - 44 U/L 6    6    ? ? ?Lab Results  ?Component Value Date  ? WBC 5.3 06/08/2021  ? HGB 8.5 (L) 06/08/2021  ? HCT 26.9 (L) 06/08/2021  ? MCV 94.4 06/08/2021  ? PLT 332 06/08/2021  ? NEUTROABS 3.2 06/08/2021  ? ? ?ASSESSMENT & PLAN:  ?No problem-specific Assessment & Plan notes found for this encounter. ? ? ? ?No orders of the defined types were placed in this encounter. ? ?The patient has a good understanding of the overall plan. she agrees with it. she will call with any problems that may develop before the next visit here. ?Total time spent: 30 mins including face to face time and time spent for planning, charting and co-ordination of care ? ? Suzzette Righter, CMA ?07/20/21 ? ?I Gardiner Coins am scribing for Dr. Lindi Adie ? ?***  ?  ?

## 2021-07-20 NOTE — Progress Notes (Incomplete)
Location of Breast Cancer: upper-outer quadrant of left breast   ? ?Histology per Pathology Report: Biopsy on 12/17/20 showed invasive ductal carcinoma and DCIS ? ?Receptor Status:  ?Estrogen Receptor: Positive (80%, strong)  ?     Progesterone Receptor: Positive (3%, weak)  ?     Her2: 3+ positive by IHC  ?     Ki-67: 25%  ? ?Did patient present with symptoms (if so, please note symptoms) or was this found on screening mammography?: Palpable lump in the upper outer left breast. ? ?Past/Anticipated interventions by surgeon, if any:  ?Procedure: ?1.  Right risk reduction skin sparing mastectomy ?2.  Left skin sparing mastectomy ?3.  Left deep axillary sentinel lymph node biopsy ?4.  Left radioactive seed axillary node guided excision ?5.  Injection of mag trace for sentinel lymph node identification ?Surgeon: Dr. Serita Grammes ? ?Past/Anticipated interventions by medical oncology, if any: Dr Lindi Adie ?Patient is on Treatment Plan : BREAST  Docetaxel + Carboplatin + Trastuzumab + Pertuzumab  (TCHP) q21d   ? ?Lymphedema issues, if any:  {:18581} {t:21944}  ? ?Pain issues, if any:  {:18581} {PAIN DESCRIPTION:21022940} ? ?SAFETY ISSUES: ?Prior radiation? {:18581} ?Pacemaker/ICD? {:18581} ?Possible current pregnancy?no, tubal ligation, chemotherapy ?Is the patient on methotrexate? {:18581} ? ?Current Complaints / other details:  *** ?   ?*** ? ? ?

## 2021-07-21 ENCOUNTER — Inpatient Hospital Stay: Payer: Medicaid Other | Admitting: Hematology and Oncology

## 2021-07-21 ENCOUNTER — Inpatient Hospital Stay: Payer: Medicaid Other

## 2021-07-21 ENCOUNTER — Telehealth: Payer: Self-pay | Admitting: Hematology and Oncology

## 2021-07-21 NOTE — Telephone Encounter (Signed)
Called patient for a FU with the appointments for tomorrow 3/24. Patient states she can't come in at that time. Will FU with MD first thing in the morning regarding this situation. ?

## 2021-07-21 NOTE — Assessment & Plan Note (Deleted)
12/17/2020:?Palpable lump in the upper outer left breast.?Diagnostic mammogram and US on?12/15/20?showed multiple suspicious masses, 2?abnormal lymph nodes with increased cortical thickness,?1 lymph node with borderline abnormal cortical thickness,?Biopsy on?12/17/20?showed invasive ductal carcinoma and DCIS, Her2+ (3+)/ER+ (80%)/PR- (<1%) in the left breast, and invasive ductal carcinoma, Her2+ (3+)/ER+ (80%)/PR+ (3%) in the left axillary lymph node ?T1CN1A stage Ib ?? ?Treatment plan: ?1. Neoadjuvant chemotherapy with TCH Perjeta ?6 cycles completed 04/26/2021 followed by Herceptin Perjeta maintenance versus Kadcyla maintenance (based on response to neoadjuvant chemo) for 1 year ?2. Left mastectomy 05/23/2021: Negative for residual cancer.  Extensive residual high-grade DCIS, 0/4 lymph nodes negative; Right mastectomy: Benign  ?Complete pathologic response ?3. Followed by adjuvant radiation therapy? ?4.??Followed by adjuvant antiestrogen therapy ?-------------------------------------------------------------------------------------------------------------------------------------------------- ?Current treatment: Herceptin Perjeta maintenance ? ?Adverse effects: None ?Return to clinic every 3 weeks for Herceptin Perjeta and every 6 weeks to follow-up with me. ?Patient will also be referred for adjuvant radiation. ?

## 2021-07-22 ENCOUNTER — Other Ambulatory Visit: Payer: Self-pay

## 2021-07-22 ENCOUNTER — Telehealth: Payer: Self-pay | Admitting: Hematology and Oncology

## 2021-07-22 ENCOUNTER — Inpatient Hospital Stay: Payer: Medicaid Other

## 2021-07-22 ENCOUNTER — Encounter: Payer: Self-pay | Admitting: *Deleted

## 2021-07-22 ENCOUNTER — Inpatient Hospital Stay (HOSPITAL_BASED_OUTPATIENT_CLINIC_OR_DEPARTMENT_OTHER): Payer: Medicaid Other | Admitting: Hematology and Oncology

## 2021-07-22 ENCOUNTER — Inpatient Hospital Stay: Payer: Medicaid Other | Attending: Hematology and Oncology

## 2021-07-22 ENCOUNTER — Inpatient Hospital Stay: Payer: Medicaid Other | Admitting: Hematology and Oncology

## 2021-07-22 VITALS — HR 86

## 2021-07-22 DIAGNOSIS — C50412 Malignant neoplasm of upper-outer quadrant of left female breast: Secondary | ICD-10-CM | POA: Insufficient documentation

## 2021-07-22 DIAGNOSIS — Z170 Estrogen receptor positive status [ER+]: Secondary | ICD-10-CM | POA: Diagnosis not present

## 2021-07-22 DIAGNOSIS — Z79899 Other long term (current) drug therapy: Secondary | ICD-10-CM | POA: Insufficient documentation

## 2021-07-22 DIAGNOSIS — Z5112 Encounter for antineoplastic immunotherapy: Secondary | ICD-10-CM | POA: Insufficient documentation

## 2021-07-22 DIAGNOSIS — Z95828 Presence of other vascular implants and grafts: Secondary | ICD-10-CM

## 2021-07-22 LAB — CBC WITH DIFFERENTIAL (CANCER CENTER ONLY)
Abs Immature Granulocytes: 0.01 10*3/uL (ref 0.00–0.07)
Basophils Absolute: 0 10*3/uL (ref 0.0–0.1)
Basophils Relative: 0 %
Eosinophils Absolute: 0.7 10*3/uL — ABNORMAL HIGH (ref 0.0–0.5)
Eosinophils Relative: 14 %
HCT: 32.2 % — ABNORMAL LOW (ref 36.0–46.0)
Hemoglobin: 10.2 g/dL — ABNORMAL LOW (ref 12.0–15.0)
Immature Granulocytes: 0 %
Lymphocytes Relative: 42 %
Lymphs Abs: 2.2 10*3/uL (ref 0.7–4.0)
MCH: 28 pg (ref 26.0–34.0)
MCHC: 31.7 g/dL (ref 30.0–36.0)
MCV: 88.5 fL (ref 80.0–100.0)
Monocytes Absolute: 0.4 10*3/uL (ref 0.1–1.0)
Monocytes Relative: 8 %
Neutro Abs: 1.9 10*3/uL (ref 1.7–7.7)
Neutrophils Relative %: 36 %
Platelet Count: 278 10*3/uL (ref 150–400)
RBC: 3.64 MIL/uL — ABNORMAL LOW (ref 3.87–5.11)
RDW: 14.1 % (ref 11.5–15.5)
WBC Count: 5.3 10*3/uL (ref 4.0–10.5)
nRBC: 0 % (ref 0.0–0.2)

## 2021-07-22 LAB — CMP (CANCER CENTER ONLY)
ALT: 5 U/L (ref 0–44)
AST: 11 U/L — ABNORMAL LOW (ref 15–41)
Albumin: 3.6 g/dL (ref 3.5–5.0)
Alkaline Phosphatase: 66 U/L (ref 38–126)
Anion gap: 4 — ABNORMAL LOW (ref 5–15)
BUN: 14 mg/dL (ref 6–20)
CO2: 31 mmol/L (ref 22–32)
Calcium: 9.3 mg/dL (ref 8.9–10.3)
Chloride: 106 mmol/L (ref 98–111)
Creatinine: 1.24 mg/dL — ABNORMAL HIGH (ref 0.44–1.00)
GFR, Estimated: 59 mL/min — ABNORMAL LOW
Glucose, Bld: 126 mg/dL — ABNORMAL HIGH (ref 70–99)
Potassium: 3.4 mmol/L — ABNORMAL LOW (ref 3.5–5.1)
Sodium: 141 mmol/L (ref 135–145)
Total Bilirubin: 0.3 mg/dL (ref 0.3–1.2)
Total Protein: 6.3 g/dL — ABNORMAL LOW (ref 6.5–8.1)

## 2021-07-22 MED ORDER — ACETAMINOPHEN 325 MG PO TABS
650.0000 mg | ORAL_TABLET | Freq: Once | ORAL | Status: AC
  Administered 2021-07-22: 650 mg via ORAL
  Filled 2021-07-22: qty 2

## 2021-07-22 MED ORDER — SODIUM CHLORIDE 0.9 % IV SOLN: Freq: Once | INTRAVENOUS | Status: AC

## 2021-07-22 MED ORDER — TRASTUZUMAB-DKST CHEMO 150 MG IV SOLR
6.0000 mg/kg | Freq: Once | INTRAVENOUS | Status: AC
  Administered 2021-07-22: 399 mg via INTRAVENOUS
  Filled 2021-07-22: qty 19

## 2021-07-22 MED ORDER — SODIUM CHLORIDE 0.9 % IV SOLN
420.0000 mg | Freq: Once | INTRAVENOUS | Status: DC
  Filled 2021-07-22: qty 14

## 2021-07-22 MED ORDER — SODIUM CHLORIDE 0.9% FLUSH
10.0000 mL | Freq: Once | INTRAVENOUS | Status: AC
  Administered 2021-07-22: 10 mL

## 2021-07-22 MED ORDER — DIPHENHYDRAMINE HCL 25 MG PO CAPS
25.0000 mg | ORAL_CAPSULE | Freq: Once | ORAL | Status: AC
  Administered 2021-07-22: 25 mg via ORAL
  Filled 2021-07-22: qty 1

## 2021-07-22 MED ORDER — SODIUM CHLORIDE 0.9% FLUSH
10.0000 mL | INTRAVENOUS | Status: DC | PRN
  Administered 2021-07-22: 10 mL

## 2021-07-22 MED ORDER — HEPARIN SOD (PORK) LOCK FLUSH 100 UNIT/ML IV SOLN
500.0000 [IU] | Freq: Once | INTRAVENOUS | Status: AC | PRN
  Administered 2021-07-22: 500 [IU]

## 2021-07-22 NOTE — Assessment & Plan Note (Signed)
12/17/2020:?Palpable lump in the upper outer left breast.?Diagnostic mammogram and US on?12/15/20?showed multiple suspicious masses, 2?abnormal lymph nodes with increased cortical thickness,?1 lymph node with borderline abnormal cortical thickness,?Biopsy on?12/17/20?showed invasive ductal carcinoma and DCIS, Her2+ (3+)/ER+ (80%)/PR- (<1%) in the left breast, and invasive ductal carcinoma, Her2+ (3+)/ER+ (80%)/PR+ (3%) in the left axillary lymph node ?T1CN1A stage Ib ?? ?Treatment plan: ?1. Neoadjuvant chemotherapy with TCH Perjeta ?6 cycles completed 04/26/2021 followed by Herceptin Perjeta maintenance versus Kadcyla maintenance (based on response to neoadjuvant chemo) for 1 year ?2. Left mastectomy 05/23/2021: Negative for residual cancer.  Extensive residual high-grade DCIS, 0/4 lymph nodes negative; Right mastectomy: Benign  ?Complete pathologic response ?3. Followed by adjuvant radiation therapy? ?4.??Followed by adjuvant antiestrogen therapy ?-------------------------------------------------------------------------------------------------------------------------------------------------- ?Current treatment: Herceptin Perjeta maintenance ? ?Adverse effects: None ?Return to clinic every 3 weeks for Herceptin Perjeta and every 6 weeks to follow-up with me. ?Patient will also be referred for adjuvant radiation. ?

## 2021-07-22 NOTE — Progress Notes (Signed)
Pt in infusion with complaint of left arm swelling and lymphedema.  MD requesting to place referral to cancer rehab. Pt states "I do not have time in my schedule right now for physical therapy".  Pt educated to call the office when she would like to be seen by PT. Pt verbalized understanding.  ?

## 2021-07-22 NOTE — Progress Notes (Signed)
Patient unable to stay for pertuzumab infusion today. Arrangements made for her to return tomorrow, Saturday at 8am for this infusion. Confirmed with Pharmacy- expiration date and time would be within parameters. Chemo will be kept in the pyxis fridge in infusion. Threasa Beards, RN authorized appointment for Saturday morning. Patient aware of appointment and plan. She will take Tylenol '650mg'$  and Benadryl '25mg'$  at home prior to arrival for the pertuzumab infusion.  ? ?Patient remained accessed for infusion on Saturday. Port was flushed and dressing changed to add biopatch and [place] applied.  ?

## 2021-07-22 NOTE — Progress Notes (Signed)
? ?Patient Care Team: ?[name], DO as PCP - General (Internal Medicine) ?Claudia Pollock, RN as Registered Nurse ?Dayna Barker, MD as Consulting Physician (General Surgery) ?Mauro Kaufmann, RN as Oncology Nurse Navigator ?Rockwell Germany, RN as Oncology Nurse Navigator ? ?DIAGNOSIS:  ?Encounter Diagnosis  ?Name Primary?  ? Malignant neoplasm of upper-outer quadrant of left breast in female, estrogen receptor positive ([place])   ? ? ?SUMMARY OF ONCOLOGIC HISTORY: ?Oncology History  ?Left breast lump (Resolved)  ?Malignant neoplasm of upper-outer quadrant of left breast in female, estrogen receptor positive ([place])  ?12/17/2020 Initial Diagnosis  ? Palpable lump in the upper outer left breast. 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, Her2+ (3+)/ER+ (80%)/PR- (<1%) in the left breast, and invasive ductal carcinoma, Her2+ (3+)/ER+ (80%)/PR+ (3%) in the left axillary lymph node ?  ?12/23/2020 Cancer Staging  ? Staging form: Breast, AJCC 8th Edition ?- Clinical stage from 12/23/2020: Stage IB (cT2, cN1, cM0, G3, ER+, PR+, HER2+) - Signed by [name], MD on 12/23/2020 ?Stage prefix: Initial diagnosis ?Histologic grading system: 3 grade system ? ?  ?01/11/2021 - 04/29/2021 Chemotherapy  ? Patient is on Treatment Plan : BREAST  Docetaxel + Carboplatin + Trastuzumab + Pertuzumab  (TCHP) q21d   ?   ?01/31/2021 Genetic Testing  ? Negative hereditary cancer genetic testing: no pathogenic variants detected in [facility] Common Hereditary Cancers +RNA Panel.  The report date is January 31, 2021.  ? ? The Common Hereditary Cancers + RNA Panel offered by [facility] includes sequencing, deletion/duplication, and RNA testing of the following 47 genes: APC, ATM, AXIN2, BARD1, BMPR1A, BRCA1, BRCA2, BRIP1, CDH1, CDK4*, CDKN2A  (p14ARF)*, CDKN2A (p16INK4a)*, CHEK2, CTNNA1, DICER1, EPCAM (Deletion/duplication testing only), GREM1 (promoter region deletion/duplication testing only), KIT, MEN1, MLH1, MSH2, MSH3, MSH6, MUTYH, NBN, NF1, NHTL1, PALB2, PDGFRA*, PMS2, POLD1, POLE, PTEN, RAD50, RAD51C, RAD51D, SDHB, SDHC, SDHD, SMAD4, SMARCA4. STK11, TP53, TSC1, TSC2, and VHL.  The following genes were evaluated for sequence changes only: SDHA and HOXB13 c.251G>A variant only.  RNA analysis is not performed for the * genes.   ?  ?05/23/2021 Surgery  ? Left mastectomy: Negative for residual cancer.  Extensive residual high-grade DCIS, 0/4 lymph nodes negative ?Right mastectomy: Benign ?Complete pathologic response ?  ?06/08/2021 -  Chemotherapy  ? Patient is on Treatment Plan : BREAST Trastuzumab  + Pertuzumab q21d x 13 cycles  ?   ? ? ?CHIEF COMPLIANT: Follow-up after breast surgery ? ?INTERVAL HISTORY: Robin Horn is a 36 y.o. with above-mentioned history of left breast cancer, completed chemotherapy with TCHP. She presents to the clinic today for follow-up after having undergone breast surgery. She state that everything went well.  She has a lot going on in her life.  Her mother just had surgery in the back and is going home today.  She has twin children and a lot of family care issues have been interfering with her ability to make these appointments.  With some difficulty she made it here for today's visit. ? ? ?ALLERGIES:  is allergic to hydrocodone-acetaminophen, tramadol, and lactose intolerance (gi). ? ?MEDICATIONS:  ?Current Outpatient Medications  ?Medication Sig Dispense Refill  ? acetaminophen (TYLENOL) 500 MG tablet Take 1,000 mg by mouth every 6 (six) hours as needed (pain).    ? amoxicillin-clavulanate (AUGMENTIN) 875-125 MG tablet Take 1 tablet by  mouth every 12 (twelve) hours. 14 tablet 0  ? baclofen (LIORESAL) 10 MG tablet Take 1 tablet by mouth 3 times daily. 30 each 0  ? lidocaine (XYLOCAINE) 2 % solution Use as directed 15 mLs  in the mouth or throat every 3 (three) hours as needed for mouth pain. 100 mL 0  ? lisinopril (ZESTRIL) 10 MG tablet Take 10 mg by mouth daily.    ? naproxen (NAPROSYN) 500 MG tablet Take 1 tablet by mouth 2 times daily with a meal. 30 tablet 0  ? zolpidem (AMBIEN) 5 MG tablet Take 1 tablet (5 mg total) by mouth at bedtime as needed. 30 tablet 1  ? ?No current facility-administered medications for this visit.  ? ? ?PHYSICAL EXAMINATION: ?ECOG PERFORMANCE STATUS: 1 - Symptomatic but completely ambulatory ? ?Vitals:  ? 07/22/21 1215  ?BP: (!) 154/93  ?Pulse: (!) 105  ?Resp: 18  ?Temp: 97.8 ?F (36.6 ?C)  ?SpO2: 97%  ? ?Filed Weights  ? 07/22/21 1215  ?Weight: 147 lb 1.6 oz (66.7 kg)  ? ?  ? ?LABORATORY DATA:  ?I have reviewed the data as listed ? ?  Latest Ref Rng & Units 06/08/2021  ? 10:18 AM 05/17/2021  ?  4:00 PM 04/26/2021  ?  9:12 AM  ?CMP  ?Glucose 70 - 99 mg/dL 88   94   90    ?BUN 6 - 20 mg/dL 10   13   14     ?Creatinine 0.44 - 1.00 mg/dL 1.28   1.11   1.30    ?Sodium 135 - 145 mmol/L 139   137   141    ?Potassium 3.5 - 5.1 mmol/L 3.8   3.6   3.6    ?Chloride 98 - 111 mmol/L 104   101   108    ?CO2 22 - 32 mmol/L 29   28   26     ?Calcium 8.9 - 10.3 mg/dL 9.0   9.2   8.9    ?Total Protein 6.5 - 8.1 g/dL 6.7    6.3    ?Total Bilirubin 0.3 - 1.2 mg/dL 0.2    0.2    ?Alkaline Phos 38 - 126 U/L 63    61    ?AST 15 - 41 U/L 12    11    ?ALT 0 - 44 U/L 6    6    ? ? ?Lab Results  ?Component Value Date  ? WBC 5.3 07/22/2021  ? HGB 10.2 (L) 07/22/2021  ? HCT 32.2 (L) 07/22/2021  ? MCV 88.5 07/22/2021  ? PLT 278 07/22/2021  ? NEUTROABS 1.9 07/22/2021  ? ? ?ASSESSMENT & PLAN:  ?Malignant neoplasm of upper-outer quadrant of left breast in female, estrogen receptor positive ([place]) ?12/17/2020: Palpable lump in the upper outer left breast. Diagnostic mammogram and [place] on 12/15/20 showed multiple suspicious masses, 2 abnormal lymph nodes with increased cortical thickness, 1 lymph node with borderline abnormal cortical thickness,  Biopsy on 12/17/20 showed invasive ductal carcinoma and DCIS, Her2+ (3+)/ER+ (80%)/PR- (<1%) in the left breast, and invasive ductal carcinoma, Her2+ (3+)/ER+ (80%)/PR+ (3%) in the left axillary lymph node ?T1CN1A stage Ib ?  ?Treatment plan: ?1. Neoadjuvant chemotherapy with [place] Perjeta ?6 cycles completed 04/26/2021 followed by Herceptin Perjeta maintenance versus Kadcyla maintenance (based on response to neoadjuvant chemo) for 1 year ?2. Left mastectomy 05/23/2021: Negative for residual cancer.  Extensive residual high-grade DCIS, 0/4 lymph nodes negative; Right mastectomy: Benign  ?Complete pathologic response ?3. Followed by adjuvant  radiation therapy  ?4.  Followed by adjuvant antiestrogen therapy ?-------------------------------------------------------------------------------------------------------------------------------------------------- ?Current treatment: Herceptin Perjeta maintenance   ?Adverse effects: None ?Return to clinic every 3 weeks for Herceptin Perjeta and every 6 weeks to follow-up with me. ?Patient will also be referred for adjuvant radiation. ?Because of childcare challenges she would like to see radiation on the same day that she gets her next Herceptin and Perjeta. ? ? ?No orders of the defined types were placed in this encounter. ? ?The patient has a good understanding of the overall plan. she agrees with it. she will call with any problems that may develop before the next visit here. ?Total time spent: 30 mins including face to face time and time spent for planning, charting and co-ordination of care ? ? Harriette Ohara, MD ?07/22/21 ? ? ? I Gardiner Coins am scribing for Dr. Lindi Adie ? ?I have reviewed the above documentation for accuracy and completeness, and I agree with the above. ?  ?

## 2021-07-22 NOTE — Telephone Encounter (Signed)
Rescheduled appointment per patient (3/24). Patient was able to do 0830/0900 for labs and MD; however, the scheduler was able to switch the times to 11:30/1200. Patient is aware of the changes made to her appointment.\ ?

## 2021-07-22 NOTE — Progress Notes (Signed)
Per MD okay to treat with echo results from 06/08/21.   ?

## 2021-07-23 ENCOUNTER — Inpatient Hospital Stay: Payer: Medicaid Other

## 2021-07-23 VITALS — BP 140/90 | HR 68 | Temp 98.0°F | Resp 18

## 2021-07-23 DIAGNOSIS — C50412 Malignant neoplasm of upper-outer quadrant of left female breast: Secondary | ICD-10-CM | POA: Diagnosis not present

## 2021-07-23 DIAGNOSIS — Z5112 Encounter for antineoplastic immunotherapy: Secondary | ICD-10-CM | POA: Diagnosis not present

## 2021-07-23 DIAGNOSIS — Z79899 Other long term (current) drug therapy: Secondary | ICD-10-CM | POA: Diagnosis not present

## 2021-07-23 DIAGNOSIS — Z170 Estrogen receptor positive status [ER+]: Secondary | ICD-10-CM

## 2021-07-23 MED ORDER — SODIUM CHLORIDE 0.9 % IV SOLN: Freq: Once | INTRAVENOUS | Status: AC

## 2021-07-23 MED ORDER — SODIUM CHLORIDE 0.9% FLUSH
10.0000 mL | INTRAVENOUS | Status: DC | PRN
  Administered 2021-07-23: 10 mL

## 2021-07-23 MED ORDER — SODIUM CHLORIDE 0.9 % IV SOLN
420.0000 mg | Freq: Once | INTRAVENOUS | Status: AC
  Administered 2021-07-23: 420 mg via INTRAVENOUS
  Filled 2021-07-23: qty 14

## 2021-07-23 MED ORDER — HEPARIN SOD (PORK) LOCK FLUSH 100 UNIT/ML IV SOLN
500.0000 [IU] | Freq: Once | INTRAVENOUS | Status: AC | PRN
  Administered 2021-07-23: 500 [IU]

## 2021-07-23 NOTE — Patient Instructions (Signed)
Robin [facility]  Discharge Instructions: ?Thank you for choosing [facility] to provide your oncology and hematology care.  ? ?If you have a lab appointment with the [facility], please go directly to the [facility] and check in at the registration area. ?  ?Wear comfortable clothing and clothing appropriate for easy access to any Portacath or PICC line.  ? ?We strive to give you quality time with your provider. You may need to reschedule your appointment if you arrive late (15 or more minutes).  Arriving late affects you and other patients whose appointments are after yours.  Also, if you miss three or more appointments without notifying the office, you may be dismissed from the clinic at the provider?s discretion.    ?  ?For prescription refill requests, have your pharmacy contact our office and allow 72 hours for refills to be completed.   ? ?Today you received the following chemotherapy and/or immunotherapy agents pertuzumab ?    ?  ?To help prevent nausea and vomiting after your treatment, we encourage you to take your nausea medication as directed. ? ?BELOW ARE SYMPTOMS THAT SHOULD BE REPORTED IMMEDIATELY: ?*FEVER GREATER THAN 100.4 F (38 ?C) OR HIGHER ?*CHILLS OR SWEATING ?*NAUSEA AND VOMITING THAT IS NOT CONTROLLED WITH YOUR NAUSEA MEDICATION ?*UNUSUAL SHORTNESS OF BREATH ?*UNUSUAL BRUISING OR BLEEDING ?*URINARY PROBLEMS (pain or burning when urinating, or frequent urination) ?*BOWEL PROBLEMS (unusual diarrhea, constipation, pain near the anus) ?TENDERNESS IN MOUTH AND THROAT WITH OR WITHOUT PRESENCE OF ULCERS (sore throat, sores in mouth, or a toothache) ?UNUSUAL RASH, SWELLING OR PAIN  ?UNUSUAL VAGINAL DISCHARGE OR ITCHING  ? ?Items with * indicate a potential emergency and should be followed up as soon as possible or go to the Emergency Department if any problems should occur. ? ?Please show the CHEMOTHERAPY ALERT CARD or IMMUNOTHERAPY ALERT CARD at check-in  to the Emergency Department and triage nurse. ? ?Should you have questions after your visit or need to cancel or reschedule your appointment, please contact [facility]  Dept: [phone]  and follow the prompts.  Office hours are 8:00 a.m. to 4:30 p.m. Monday - Friday. Please note that voicemails left after 4:00 p.m. may not be returned until the following business day.  We are closed weekends and major holidays. You have access to a nurse at all times for urgent questions. Please call the main number to the clinic Dept: [phone] and follow the prompts. ? ? ?For any non-urgent questions, you may also contact your provider using MyChart. We now offer e-Visits for anyone 5 and older to request care online for non-urgent symptoms. For details visit [email or web address]. ?  ?Also download the MyChart app! Go to the app store, search "MyChart", open the app, select [facility], and log in with your MyChart username and password. ? ?Due to Covid, a mask is required upon entering the hospital/clinic. If you do not have a mask, one will be given to you upon arrival. For doctor visits, patients may have 1 support person aged 45 or older with them. For treatment visits, patients cannot have anyone with them due to current Covid guidelines and our immunocompromised population.  ? ?

## 2021-07-23 NOTE — Progress Notes (Signed)
Pt states that "due to childcare issues and my mom being sick I cannot stay." Pt informed of risks of not remaining for 30-minutes post infusion. Pt states understanding. Pt discharged in stable condition, ambulatory to waiting room.  ?

## 2021-07-25 ENCOUNTER — Telehealth: Payer: Self-pay | Admitting: Hematology and Oncology

## 2021-07-25 NOTE — Telephone Encounter (Signed)
Per Dr.Gudena's 3/24 los, scheduler can not make, cancel or reschedule [facility] appointments. Scheduler sent a staff message (07/25/21) to [facility] [phone] to reschedule upcoming [place] appointments. ?

## 2021-07-26 ENCOUNTER — Telehealth: Payer: Self-pay | Admitting: Hematology and Oncology

## 2021-07-26 ENCOUNTER — Telehealth: Payer: Self-pay | Admitting: Plastic Surgery

## 2021-07-26 NOTE — Progress Notes (Incomplete)
?Radiation Oncology         [phone] ?________________________________ ? ?Initial Outpatient Consultation ? ?Name: Robin Horn MRN: [id number]  ?Date: 07/27/2021  DOB: 1986-01-13 ? ?NG:EXBM, [name] DO  [name], MD  ? ?REFERRING PHYSICIAN: [name], MD ? ?DIAGNOSIS: The encounter diagnosis was Malignant neoplasm of upper-outer quadrant of left breast in female, estrogen receptor positive ([place]). ? 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 ?HISTORY OF PRESENT ILLNESS::Robin Horn is a 36 y.o. female who is accompanied by ***. she is seen as a courtesy of Dr. Lindi Adie for an opinion concerning radiation therapy as part of management for her diagnosed left breast cancer.  ? ?The patient initially presented with a palpable lump in the upper outer left breast. Subsequent diagnostic mammogram and [place] on 12/15/20 showed multiple suspicious masses in the upper outer left breast, 2 abnormal lymph nodes with increased cortical thickness, 1 lymph node with borderline abnormal cortical thickness, and no mammographic evidence of malignancy in the right breast.  ? ?Left breast biopsies at the 1 o'clock, 12:30 o'clock, 2 o'clock positions and left axillary lymph node biopsy on 12/17/20 revealed grade 3 invasive ductal carcinoma from all 4 biopsies measuring 1.4 cm in the greatest linear extent, in addition to DCIS from the 2 o'clock biopsy. Prognostic indicators significant for: ER status 80% positive with strong staining intensity; PR status negative; Her2 status positive; Proliferation marker Ki67 at 25%; Grade 3. ? ?Bilateral breast MRI on 01/08/21 demonstrated 3 dominant spiculated masses in the upper-outer quadrant of the left breast, with interconnecting linear and nodular enhancement measuring up to 7.4 cm. Three abnormal left axillary lymph nodes were also  appreciated. (No abnormal enhancements were appreciated in the right breast). ? ?The patient proceeded to under neoadjuvant chemotherapy consisting of [place] Perjeta ?6 cycles on 01/11/21 through 04/26/21 under the care of Dr. Lindi Adie. The patient overall tolerated systemic treatment well other than mild fatigue. She is currently on Herceptin Perjeta maintenance. ? ?Genetic testing performed on 01/31/21 showed no pathogenic variants detected by +RNAinsight testing.  ? ?Bilateral breast MRI on 05/03/21 demonstrated: complete interval resolution of masses and non mass enhancement in the outer portion of the left breast; asymmetric flattening of the left nipple compared to the right; an enlarged left axillary lymph node with normal morphology; and a probably benign 7 millimeter mass in the left hepatic lobe ? ?The patient opted to proceed with bilateral mastectomies and nodal biopsies on 05/23/21 under the care of Dr. Donne Hazel. Pathology from the procedure revealed extensive residual high-grade ductal carcinoma in situ in the left breast without residual tumor, and no evidence of residual invasive carcinoma. Nodal status of 4/4 left axillary SLN excisions negative for carcinoma. Prognostic indicators significant for: ER status 80% positive with strong staining intensity; PR status 3% positive with weak staining intensity; Her2 status positive; Proliferation marker Ki67 at 25%. Right breast mastectomy revealed benign findings. The patient also underwent bilateral breast reconstructions following mastectomies.  ? ?Per her most recent follow-up visit with Dr. Lindi Adie on 07/22/21, the patient is tolerating herceptin perjeta maintenance well. She did report that her family life is quite stressful right now. Her mother recently had back surgery and she has twins. Understandably, she has been struggling to keep her appointments due to caring for her mother and her children. ? ?PREVIOUS RADIATION THERAPY: No ? ?PAST MEDICAL  HISTORY:  ?Past Medical History:  ?Diagnosis Date  ? Abnormal Pap smear of cervix   ? Anemia   ? Anxiety   ? Arthritis   ? Bipolar disorder ([place])   ? diagnosed years ago  ? Cancer [facility])   ? Chlamydia   ? Depression   ? Family history of breast cancer 01/18/2021  ? GDM (gestational diabetes mellitus)   ? GERD (gastroesophageal reflux disease)   ? pt used to take omeprazole, doesn't have much issues with it anymore  ? Gonorrhea   ? Herpes genitalia   ? History of hiatal hernia   ? Migraines   ? Ovarian cyst   ? Polycystic kidney disease   ? Preeclampsia   ? Pregnancy induced hypertension   ? ? ?PAST SURGICAL HISTORY: ?Past Surgical History:  ?Procedure Laterality Date  ? BREAST RECONSTRUCTION WITH PLACEMENT OF TISSUE EXPANDER AND FLEX HD (ACELLULAR HYDRATED DERMIS) Bilateral 05/23/2021  ? Procedure: BREAST RECONSTRUCTION WITH PLACEMENT OF TISSUE EXPANDER AND FLEX HD (ACELLULAR HYDRATED DERMIS);  Surgeon: Cindra Presume, MD;  Location: [place];  Service: Plastics;  Laterality: Bilateral;  ? COLPOSCOPY    ? DILATION AND CURETTAGE OF UTERUS    ? MASTECTOMY W/ SENTINEL NODE BIOPSY Left 05/23/2021  ? Procedure: LEFT MASTECTOMY WITH LEFT AXILLARY SENTINEL LYMPH NODE BIOPSY;  Surgeon: Rolm Bookbinder, MD;  Location: [place];  Service: General;  Laterality: Left;  ? PORT A CATH REVISION Right 01/31/2021  ? Procedure: PORT A CATH REVISION;  Surgeon: Rolm Bookbinder, MD;  Location: [place];  Service: General;  Laterality: Right;  ? PORTACATH PLACEMENT N/A 01/06/2021  ? Procedure: INSERTION PORT-A-CATH;  Surgeon: Rolm Bookbinder, MD;  Location: [place];  Service: General;  Laterality: N/A;  ? RADIOACTIVE SEED GUIDED AXILLARY SENTINEL LYMPH NODE Left 05/23/2021  ? Procedure: RADIOACTIVE SEED GUIDED LEFT AXILLARY SENTINEL LYMPH NODE EXCISION;  Surgeon: Rolm Bookbinder, MD;  Location: [place];  Service: General;  Laterality: Left;  ? TONSILLECTOMY    ? TOTAL MASTECTOMY Right 05/23/2021  ? Procedure: RIGHT TOTAL  MASTECTOMY;  Surgeon: Rolm Bookbinder, MD;  Location: [place];  Service: General;  Laterality: Right;  ? TUBAL LIGATION Bilateral 09/24/2016  ? Procedure: POST PARTUM TUBAL LIGATION;  Surgeon: Lavonia Drafts, MD;  Location: [place];  Service: Gynecology;  Laterality: Bilateral;  ? ? ?FAMILY HISTORY:  ?Family History  ?Problem Relation Age of Onset  ? Kidney disease Mother   ? Breast cancer Maternal Aunt   ?     dx 29s  ? Breast cancer Other   ?     MGM's sister and MGM's niece; dx after 22  ? Other Neg Hx   ? ? ?SOCIAL HISTORY:  ?Social History  ? ?Tobacco Use  ? Smoking status: Every Day  ?  Packs/day: 0.25  ?  Types: Cigarettes  ? Smokeless tobacco: Never  ? Tobacco comments:  ?  Wants to restart Nicotine  ?Vaping Use  ? Vaping Use: Never used  ?Substance Use Topics  ? Alcohol use: Not Currently  ?  Alcohol/week: 1.0 standard drink  ?  Types: 1 Glasses of wine per week  ? Drug use: Not Currently  ? ? ?ALLERGIES:  ?Allergies  ?Allergen Reactions  ? Hydrocodone-Acetaminophen Hives  ? Tramadol Itching  ? Lactose Intolerance (Gi) Diarrhea  ? ? ?MEDICATIONS:  ?Current Outpatient Medications  ?Medication Sig Dispense Refill  ? acetaminophen (TYLENOL) 500 MG tablet Take 1,000 mg by mouth every 6 (six) hours as needed (  pain).    ? amoxicillin-clavulanate (AUGMENTIN) 875-125 MG tablet Take 1 tablet by mouth every 12 (twelve) hours. 14 tablet 0  ? baclofen (LIORESAL) 10 MG tablet Take 1 tablet by mouth 3 times daily. 30 each 0  ? lidocaine (XYLOCAINE) 2 % solution Use as directed 15 mLs in the mouth or throat every 3 (three) hours as needed for mouth pain. 100 mL 0  ? lisinopril (ZESTRIL) 10 MG tablet Take 10 mg by mouth daily.    ? naproxen (NAPROSYN) 500 MG tablet Take 1 tablet by mouth 2 times daily with a meal. 30 tablet 0  ? zolpidem (AMBIEN) 5 MG tablet Take 1 tablet (5 mg total) by mouth at bedtime as needed. 30 tablet 1  ? ?No current facility-administered medications for this encounter.   ? ? ?REVIEW OF SYSTEMS:  A 10+ POINT REVIEW OF SYSTEMS WAS OBTAINED including neurology, dermatology, psychiatry, cardiac, respiratory, lymph, extremities, GI, GU, musculoskeletal, constitutional, reproductive, HEENT. *** ?  ?PH

## 2021-07-26 NOTE — Telephone Encounter (Signed)
Talked with Dr.Kinard's nurse directed about changing the patients [place] appointment scheduled on 3/29. Nurse confirmed she will have someone handle this change. ?

## 2021-07-26 NOTE — Telephone Encounter (Signed)
Amy is calling from the [facility] wanting to know if the pt has completed her expansions so that they are able to get there scheduled for radiation. ?

## 2021-07-26 NOTE — Telephone Encounter (Signed)
Adv Amy that pt was last seen in office 2/9 and drains were removed. Pace adv pt to come back in a week and expansion would start. Pt was sch x 2/15 and 2/23 for fills and no showed both appts. She hasn't returned our phone calls to reschedule. Amy conveyed that she would inform the provider that is over her radiation plan.  ?

## 2021-07-27 ENCOUNTER — Ambulatory Visit
Admission: RE | Admit: 2021-07-27 | Discharge: 2021-07-27 | Disposition: A | Discharge status: Home / Self Care | Payer: Medicaid Other | Source: Ambulatory Visit | Attending: Radiation Oncology | Admitting: Radiation Oncology

## 2021-07-27 ENCOUNTER — Ambulatory Visit: Payer: Medicaid Other

## 2021-07-27 DIAGNOSIS — C50412 Malignant neoplasm of upper-outer quadrant of left female breast: Secondary | ICD-10-CM

## 2021-07-30 DIAGNOSIS — Z419 Encounter for procedure for purposes other than remedying health state, unspecified: Secondary | ICD-10-CM | POA: Diagnosis not present

## 2021-08-03 ENCOUNTER — Telehealth: Payer: Self-pay

## 2021-08-03 NOTE — Telephone Encounter (Signed)
Amy called from the [facility] to follow-up on patient.  She would like to know if she has been seen since 06/09/2021 and where she was in treatment.  Please call. ?

## 2021-08-04 NOTE — Telephone Encounter (Signed)
Left detailed message and adv Amy to give office a call back if she had additional questions.  ?

## 2021-08-05 ENCOUNTER — Encounter: Payer: Self-pay | Admitting: *Deleted

## 2021-08-09 ENCOUNTER — Telehealth: Payer: Self-pay | Admitting: *Deleted

## 2021-08-09 NOTE — Telephone Encounter (Signed)
Received call from pt requesting prescription for muscle spasms.  Pt notified that MD is out of the office and would need to reach out to her PCP.  Pt verbalized understanding.  ?

## 2021-08-10 ENCOUNTER — Other Ambulatory Visit: Payer: Self-pay | Admitting: *Deleted

## 2021-08-10 DIAGNOSIS — Z170 Estrogen receptor positive status [ER+]: Secondary | ICD-10-CM

## 2021-08-11 ENCOUNTER — Inpatient Hospital Stay: Payer: 59

## 2021-08-12 ENCOUNTER — Inpatient Hospital Stay: Payer: 59

## 2021-08-12 ENCOUNTER — Inpatient Hospital Stay: Payer: 59 | Attending: Hematology and Oncology

## 2021-08-12 ENCOUNTER — Other Ambulatory Visit: Payer: Self-pay | Admitting: *Deleted

## 2021-08-12 ENCOUNTER — Other Ambulatory Visit: Payer: Self-pay

## 2021-08-12 VITALS — BP 141/95 | HR 71 | Temp 98.4°F | Resp 18 | Wt 147.8 lb

## 2021-08-12 DIAGNOSIS — Z79899 Other long term (current) drug therapy: Secondary | ICD-10-CM | POA: Insufficient documentation

## 2021-08-12 DIAGNOSIS — C50412 Malignant neoplasm of upper-outer quadrant of left female breast: Secondary | ICD-10-CM | POA: Diagnosis not present

## 2021-08-12 DIAGNOSIS — Z95828 Presence of other vascular implants and grafts: Secondary | ICD-10-CM

## 2021-08-12 DIAGNOSIS — Z5112 Encounter for antineoplastic immunotherapy: Secondary | ICD-10-CM | POA: Diagnosis not present

## 2021-08-12 DIAGNOSIS — Z170 Estrogen receptor positive status [ER+]: Secondary | ICD-10-CM

## 2021-08-12 LAB — CBC WITH DIFFERENTIAL (CANCER CENTER ONLY)
Abs Immature Granulocytes: 0.01 10*3/uL (ref 0.00–0.07)
Basophils Absolute: 0 10*3/uL (ref 0.0–0.1)
Basophils Relative: 0 %
Eosinophils Absolute: 0.6 10*3/uL — ABNORMAL HIGH (ref 0.0–0.5)
Eosinophils Relative: 9 %
HCT: 33 % — ABNORMAL LOW (ref 36.0–46.0)
Hemoglobin: 10.6 g/dL — ABNORMAL LOW (ref 12.0–15.0)
Immature Granulocytes: 0 %
Lymphocytes Relative: 42 %
Lymphs Abs: 2.8 10*3/uL (ref 0.7–4.0)
MCH: 27.8 pg (ref 26.0–34.0)
MCHC: 32.1 g/dL (ref 30.0–36.0)
MCV: 86.6 fL (ref 80.0–100.0)
Monocytes Absolute: 0.3 10*3/uL (ref 0.1–1.0)
Monocytes Relative: 5 %
Neutro Abs: 2.9 10*3/uL (ref 1.7–7.7)
Neutrophils Relative %: 44 %
Platelet Count: 263 10*3/uL (ref 150–400)
RBC: 3.81 MIL/uL — ABNORMAL LOW (ref 3.87–5.11)
RDW: 13.7 % (ref 11.5–15.5)
WBC Count: 6.6 10*3/uL (ref 4.0–10.5)
nRBC: 0 % (ref 0.0–0.2)

## 2021-08-12 LAB — CMP (CANCER CENTER ONLY)
ALT: 8 U/L (ref 0–44)
AST: 12 U/L — ABNORMAL LOW (ref 15–41)
Albumin: 3.7 g/dL (ref 3.5–5.0)
Alkaline Phosphatase: 61 U/L (ref 38–126)
Anion gap: 4 — ABNORMAL LOW (ref 5–15)
BUN: 22 mg/dL — ABNORMAL HIGH (ref 6–20)
CO2: 29 mmol/L (ref 22–32)
Calcium: 9.2 mg/dL (ref 8.9–10.3)
Chloride: 106 mmol/L (ref 98–111)
Creatinine: 1.15 mg/dL — ABNORMAL HIGH (ref 0.44–1.00)
GFR, Estimated: >60 mL/min (ref >60)
Glucose, Bld: 97 mg/dL (ref 70–99)
Potassium: 3.7 mmol/L (ref 3.5–5.1)
Sodium: 139 mmol/L (ref 135–145)
Total Bilirubin: 0.3 mg/dL (ref 0.3–1.2)
Total Protein: 6.4 g/dL — ABNORMAL LOW (ref 6.5–8.1)

## 2021-08-12 MED ORDER — SODIUM CHLORIDE 0.9 % IV SOLN: Freq: Once | INTRAVENOUS | Status: AC

## 2021-08-12 MED ORDER — LIDOCAINE-PRILOCAINE 2.5-2.5 % EX CREA: 1.0000 "application " | TOPICAL_CREAM | CUTANEOUS | 0 refills | Status: DC | PRN

## 2021-08-12 MED ORDER — TRASTUZUMAB-DKST CHEMO 150 MG IV SOLR
6.0000 mg/kg | Freq: Once | INTRAVENOUS | Status: AC
  Administered 2021-08-12: 399 mg via INTRAVENOUS
  Filled 2021-08-12: qty 19

## 2021-08-12 MED ORDER — HEPARIN SOD (PORK) LOCK FLUSH 100 UNIT/ML IV SOLN
500.0000 [IU] | Freq: Once | INTRAVENOUS | Status: AC | PRN
  Administered 2021-08-12: 500 [IU]

## 2021-08-12 MED ORDER — SODIUM CHLORIDE 0.9% FLUSH
10.0000 mL | INTRAVENOUS | Status: DC | PRN
  Administered 2021-08-12: 10 mL

## 2021-08-12 MED ORDER — ACETAMINOPHEN 325 MG PO TABS: 650.0000 mg | ORAL_TABLET | Freq: Once | ORAL | Status: DC

## 2021-08-12 MED ORDER — SODIUM CHLORIDE 0.9% FLUSH
10.0000 mL | Freq: Once | INTRAVENOUS | Status: AC
  Administered 2021-08-12: 10 mL

## 2021-08-12 MED ORDER — SODIUM CHLORIDE 0.9 % IV SOLN
420.0000 mg | Freq: Once | INTRAVENOUS | Status: AC
  Administered 2021-08-12: 420 mg via INTRAVENOUS
  Filled 2021-08-12: qty 14

## 2021-08-12 MED ORDER — DIPHENHYDRAMINE HCL 25 MG PO CAPS: 25.0000 mg | ORAL_CAPSULE | Freq: Once | ORAL | Status: DC

## 2021-08-12 NOTE — Patient Instructions (Signed)
[facility]ICAL [facility]COL[facility]  Discharge Instructions: Thank you for choosing [facility] to provide your oncology and hematology care.   If you have a lab appointment with the Cancer Center, please go directly to the Cancer Center and check in at the registration area.   Wear comfortable clothing and clothing appropriate for easy access to any Portacath or PICC line.   We strive to give you quality time with your provider. You may need to reschedule your appointment if you arrive late (15 or more minutes).  Arriving late affects you and other patients whose appointments are after yours.  Also, if you miss three or more appointments without notifying the office, you may be dismissed from the clinic at the provider's discretion.      For prescription refill requests, have your pharmacy contact our office and allow 72 hours for refills to be completed.    Today you received the following chemotherapy and/or immunotherapy agents: Trastuzumab, Pertuzumab.      To help prevent nausea and vomiting after your treatment, we encourage you to take your nausea medication as directed.  BELOW ARE SYMPTOMS THAT SHOULD BE REPORTED IMMEDIATELY: *FEVER GREATER THAN 100.4 F (38 C) OR HIGHER *CHILLS OR SWEATING *NAUSEA AND VOMITING THAT IS NOT CONTROLLED WITH YOUR NAUSEA MEDICATION *UNUSUAL SHORTNESS OF BREATH *UNUSUAL BRUISING OR BLEEDING *URINARY PROBLEMS (pain or burning when urinating, or frequent urination) *BOWEL PROBLEMS (unusual diarrhea, constipation, pain near the anus) TENDERNESS IN MOUTH AND THROAT WITH OR WITHOUT PRESENCE OF ULCERS (sore throat, sores in mouth, or a toothache) UNUSUAL RASH, SWELLING OR PAIN  UNUSUAL VAGINAL DISCHARGE OR ITCHING   Items with * indicate a potential emergency and should be followed up as soon as possible or go to the Emergency Department if any problems should occur.  Please show the CHEMOTHERAPY ALERT CARD or IMMUNOTHERAPY ALERT CARD  at check-in to the Emergency Department and triage nurse.  Should you have questions after your visit or need to cancel or reschedule your appointment, please contact [facility]  Dept: [phone]  and follow the prompts.  Office hours are 8:00 a.m. to 4:30 p.m. Monday - Friday. Please note that voicemails left after 4:00 p.m. may not be returned until the following business day.  We are closed weekends and major holidays. You have access to a nurse at all times for urgent questions. Please call the main number to the clinic Dept: [phone] and follow the prompts.   For any non-urgent questions, you may also contact your provider using MyChart. We now offer e-Visits for anyone 18 and older to request care online for non-urgent symptoms. For details visit [email or web address].   Also download the MyChart app! Go to the app store, search "MyChart", open the app, select [facility], and log in with your MyChart username and password.  Due to Covid, a mask is required upon entering the hospital/clinic. If you do not have a mask, one will be given to you upon arrival. For doctor visits, patients may have 1 support person aged 18 or older with them. For treatment visits, patients cannot have anyone with them due to current Covid guidelines and our immunocompromised population.  

## 2021-08-12 NOTE — Progress Notes (Signed)
Pt states she took Tylenol and Benadryl prior to coming into the infusion center today. Pre-meds not given prior to tx. ? ?Pt refused 30 minute observation period post Pertuzumab infusion. VSS. No complaints at time of discharge.  ?

## 2021-08-18 NOTE — Progress Notes (Signed)
? ?Patient Care Team: ?[name], DO as PCP - General (Internal Medicine) ?Claudia Pollock, RN as Registered Nurse ?Dayna Barker, MD as Consulting Physician (General Surgery) ?Mauro Kaufmann, RN as Oncology Nurse Navigator ?Rockwell Germany, RN as Oncology Nurse Navigator ? ?DIAGNOSIS:  ?Encounter Diagnosis  ?Name Primary?  ? Malignant neoplasm of upper-outer quadrant of left breast in female, estrogen receptor positive ([place])   ? ? ?SUMMARY OF ONCOLOGIC HISTORY: ?Oncology History  ?Left breast lump ([place])  ?Malignant neoplasm of upper-outer quadrant of left breast in female, estrogen receptor positive ([place])  ?12/17/2020 Initial Diagnosis  ? Palpable lump in the upper outer left breast. 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, Her2+ (3+)/ER+ (80%)/PR- (<1%) in the left breast, and invasive ductal carcinoma, Her2+ (3+)/ER+ (80%)/PR+ (3%) in the left axillary lymph node ?  ?12/23/2020 Cancer Staging  ? Staging form: Breast, AJCC 8th Edition ?- Clinical stage from 12/23/2020: Stage IB (cT2, cN1, cM0, G3, ER+, PR+, HER2+) - Signed by [name], MD on 12/23/2020 ?Stage prefix: Initial diagnosis ?Histologic grading system: 3 grade system ? ?  ?01/11/2021 - 04/29/2021 Chemotherapy  ? Patient is on Treatment Plan : BREAST  Docetaxel + Carboplatin + Trastuzumab + Pertuzumab  (TCHP) q21d   ? ?  ?  ?01/31/2021 Genetic Testing  ? Negative hereditary cancer genetic testing: no pathogenic variants detected in [facility] Common Hereditary Cancers +RNA Panel.  The report date is January 31, 2021.  ? ? The Common Hereditary Cancers + RNA Panel offered by [facility] includes sequencing, deletion/duplication, and RNA testing of the following 47 genes: APC, ATM, AXIN2, BARD1, BMPR1A, BRCA1, BRCA2, BRIP1, CDH1, CDK4*, CDKN2A  (p14ARF)*, CDKN2A (p16INK4a)*, CHEK2, CTNNA1, DICER1, EPCAM (Deletion/duplication testing only), GREM1 (promoter region deletion/duplication testing only), KIT, MEN1, MLH1, MSH2, MSH3, MSH6, MUTYH, NBN, NF1, NHTL1, PALB2, PDGFRA*, PMS2, POLD1, POLE, PTEN, RAD50, RAD51C, RAD51D, SDHB, SDHC, SDHD, SMAD4, SMARCA4. STK11, TP53, TSC1, TSC2, and VHL.  The following genes were evaluated for sequence changes only: SDHA and HOXB13 c.251G>A variant only.  RNA analysis is not performed for the * genes.   ?  ?05/23/2021 Surgery  ? Left mastectomy: Negative for residual cancer.  Extensive residual high-grade DCIS, 0/4 lymph nodes negative ?Right mastectomy: Benign ?Complete pathologic response ?  ?06/08/2021 -  Chemotherapy  ? Patient is on Treatment Plan : BREAST Trastuzumab  + Pertuzumab q21d x 13 cycles  ? ?  ?  ? ? ?CHIEF COMPLIANT:  Follow-up after breast surgery ? ?INTERVAL HISTORY: Robin Horn is a 36 y.o. with above-mentioned history of left breast cancer, completed chemotherapy with TCHP. She presents to the clinic today for follow-up  ? ? ?ALLERGIES:  is allergic to hydrocodone-acetaminophen, tramadol, and lactose intolerance (gi). ? ?MEDICATIONS:  ?Current Outpatient Medications  ?Medication Sig Dispense Refill  ? acetaminophen (TYLENOL) 500 MG tablet Take 1,000 mg by mouth every 6 (six) hours as needed (pain).    ? amoxicillin-clavulanate (AUGMENTIN) 875-125 MG tablet Take 1 tablet by mouth every 12 (twelve) hours. 14 tablet 0  ? baclofen (LIORESAL) 10 MG tablet Take 1 tablet by mouth 3 times daily. 30 each 0  ? lidocaine (XYLOCAINE) 2 % solution Use as directed 15 mLs in the mouth or throat every 3 (three) hours as needed for mouth pain. 100 mL 0  ? lidocaine-prilocaine (EMLA) cream Apply 1  application. topically as needed. 30 g 0  ? lisinopril (ZESTRIL) 10 MG tablet Take 10 mg by mouth daily.    ? naproxen (NAPROSYN) 500 MG tablet Take 1 tablet by mouth 2 times daily with a meal. 30 tablet 0  ? zolpidem (AMBIEN) 5  MG tablet Take 1 tablet (5 mg total) by mouth at bedtime as needed. 30 tablet 1  ? ?No current facility-administered medications for this visit.  ? ? ?PHYSICAL EXAMINATION: ?ECOG PERFORMANCE STATUS: 1 - Symptomatic but completely ambulatory ? ?There were no vitals filed for this visit. ?There were no vitals filed for this visit. ?  ? ?LABORATORY DATA:  ?I have reviewed the data as listed ? ?  Latest Ref Rng & Units 08/12/2021  ?  7:45 AM 07/22/2021  ? 12:02 PM 06/08/2021  ? 10:18 AM  ?CMP  ?Glucose 70 - 99 mg/dL 97   126   88    ?BUN 6 - 20 mg/dL _0 ?Creatinine 0.44 - 1.00 mg/dL 1.15   1.24   1.28    ?Sodium 135 - 145 mmol/L 139   141   139    ?Potassium 3.5 - 5.1 mmol/L 3.7   3.4   3.8    ?Chloride 98 - 111 mmol/L 106   106   104    ?CO2 22 - 32 mmol/L _1 ?Calcium 8.9 - 10.3 mg/dL 9.2   9.3   9.0    ?Total Protein 6.5 - 8.1 g/dL 6.4   6.3   6.7    ?Total Bilirubin 0.3 - 1.2 mg/dL 0.3   0.3   0.2    ?Alkaline Phos 38 - 126 U/L 61   66   63    ?AST 15 - 41 U/L _2 ?ALT 0 - 44 U/L _3 ? ? ?Lab Results  ?Component Value Date  ? WBC 6.6 08/12/2021  ? HGB 10.6 (L) 08/12/2021  ? HCT 33.0 (L) 08/12/2021  ? MCV 86.6 08/12/2021  ? PLT 263 08/12/2021  ? NEUTROABS 2.9 08/12/2021  ? ? ?ASSESSMENT & PLAN:  ?Malignant neoplasm of upper-outer quadrant of left breast in female, estrogen receptor positive ([place]) ?12/17/2020: Palpable lump in the upper outer left breast. 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, Her2+ (3+)/ER+ (80%)/PR- (<1%) in the left breast, and invasive ductal carcinoma, Her2+ (3+)/ER+ (80%)/PR+ (3%) in the left axillary lymph node ?T1CN1A stage Ib ?  ?Treatment plan: ?1. Neoadjuvant chemotherapy with [place] Perjeta ?6 cycles completed 04/26/2021 followed by Herceptin  Perjeta maintenance versus Kadcyla maintenance (based on response to neoadjuvant chemo) for 1 year ?2. Left mastectomy 05/23/2021: Negative for residual cancer.  Extensive residual high-grade DCIS, 0/4 lymph nodes negative; Right mastectomy: Benign  ?Complete pathologic response ?3. Followed by adjuvant radiation therapy  ?4.  Followed by adjuvant antiestrogen therapy ?-------------------------------------------------------------------------------------------------------------------------------------------------- ?Current treatment: Herceptin Perjeta maintenance   ?Adverse effects: None ?Return to clinic every 3 weeks for Herceptin Perjeta and every 6 weeks to follow-up with me. ? ?I sent a message to Dr. Sondra Come and Dr. Claudia Desanctis to discuss her reconstruction approach. ? ? ? ?No orders of the defined types were placed in  this encounter. ? ?The patient has a good understanding of the overall plan. she agrees with it. she will call with any problems that may develop before the next visit here. ?Total time spent: 30 mins including face to face time and time spent for planning, charting and co-ordination of care ? ? Harriette Ohara, MD ?09/01/21 ? ? ? I Gardiner Coins am scribing for Dr. Lindi Adie ? ?I have reviewed the above documentation for accuracy and completeness, and I agree with the above. ?. ?

## 2021-08-29 DIAGNOSIS — Z419 Encounter for procedure for purposes other than remedying health state, unspecified: Secondary | ICD-10-CM | POA: Diagnosis not present

## 2021-08-31 ENCOUNTER — Telehealth: Payer: Self-pay | Admitting: Radiation Oncology

## 2021-08-31 ENCOUNTER — Encounter: Payer: Self-pay | Admitting: Hematology and Oncology

## 2021-08-31 ENCOUNTER — Other Ambulatory Visit (HOSPITAL_COMMUNITY): Payer: Self-pay

## 2021-08-31 NOTE — Telephone Encounter (Signed)
LVM to r/s missed 3/29 CON appt with Dr. Sondra Come  ?

## 2021-09-01 ENCOUNTER — Encounter: Payer: Self-pay | Admitting: *Deleted

## 2021-09-01 ENCOUNTER — Telehealth: Payer: Self-pay | Admitting: Radiation Oncology

## 2021-09-01 ENCOUNTER — Inpatient Hospital Stay (HOSPITAL_BASED_OUTPATIENT_CLINIC_OR_DEPARTMENT_OTHER): Payer: Medicaid Other | Admitting: Hematology and Oncology

## 2021-09-01 ENCOUNTER — Inpatient Hospital Stay: Payer: Medicaid Other | Attending: Hematology and Oncology

## 2021-09-01 ENCOUNTER — Inpatient Hospital Stay: Payer: Medicaid Other

## 2021-09-01 ENCOUNTER — Other Ambulatory Visit (HOSPITAL_COMMUNITY): Payer: Self-pay

## 2021-09-01 ENCOUNTER — Other Ambulatory Visit: Payer: Self-pay

## 2021-09-01 ENCOUNTER — Inpatient Hospital Stay: Payer: Medicaid Other | Admitting: Licensed Clinical Social Worker

## 2021-09-01 DIAGNOSIS — Z5112 Encounter for antineoplastic immunotherapy: Secondary | ICD-10-CM | POA: Insufficient documentation

## 2021-09-01 DIAGNOSIS — C50412 Malignant neoplasm of upper-outer quadrant of left female breast: Secondary | ICD-10-CM | POA: Insufficient documentation

## 2021-09-01 DIAGNOSIS — Z79899 Other long term (current) drug therapy: Secondary | ICD-10-CM | POA: Diagnosis not present

## 2021-09-01 DIAGNOSIS — Z170 Estrogen receptor positive status [ER+]: Secondary | ICD-10-CM | POA: Diagnosis not present

## 2021-09-01 LAB — CBC WITH DIFFERENTIAL (CANCER CENTER ONLY)
Abs Immature Granulocytes: 0.01 10*3/uL (ref 0.00–0.07)
Basophils Absolute: 0 10*3/uL (ref 0.0–0.1)
Basophils Relative: 0 %
Eosinophils Absolute: 0.5 10*3/uL (ref 0.0–0.5)
Eosinophils Relative: 8 %
HCT: 33.4 % — ABNORMAL LOW (ref 36.0–46.0)
Hemoglobin: 11.2 g/dL — ABNORMAL LOW (ref 12.0–15.0)
Immature Granulocytes: 0 %
Lymphocytes Relative: 37 %
Lymphs Abs: 2.3 10*3/uL (ref 0.7–4.0)
MCH: 27.9 pg (ref 26.0–34.0)
MCHC: 33.5 g/dL (ref 30.0–36.0)
MCV: 83.3 fL (ref 80.0–100.0)
Monocytes Absolute: 0.3 10*3/uL (ref 0.1–1.0)
Monocytes Relative: 5 %
Neutro Abs: 3.1 10*3/uL (ref 1.7–7.7)
Neutrophils Relative %: 50 %
Platelet Count: 204 10*3/uL (ref 150–400)
RBC: 4.01 MIL/uL (ref 3.87–5.11)
RDW: 14.6 % (ref 11.5–15.5)
WBC Count: 6.3 10*3/uL (ref 4.0–10.5)
nRBC: 0 % (ref 0.0–0.2)

## 2021-09-01 LAB — CMP (CANCER CENTER ONLY)
ALT: 9 U/L (ref 0–44)
AST: 13 U/L — ABNORMAL LOW (ref 15–41)
Albumin: 3.7 g/dL (ref 3.5–5.0)
Alkaline Phosphatase: 64 U/L (ref 38–126)
Anion gap: 7 (ref 5–15)
BUN: 12 mg/dL (ref 6–20)
CO2: 28 mmol/L (ref 22–32)
Calcium: 9.1 mg/dL (ref 8.9–10.3)
Chloride: 106 mmol/L (ref 98–111)
Creatinine: 1.3 mg/dL — ABNORMAL HIGH (ref 0.44–1.00)
GFR, Estimated: 55 mL/min — ABNORMAL LOW (ref >60)
Glucose, Bld: 93 mg/dL (ref 70–99)
Potassium: 3.6 mmol/L (ref 3.5–5.1)
Sodium: 141 mmol/L (ref 135–145)
Total Bilirubin: 0.2 mg/dL — ABNORMAL LOW (ref 0.3–1.2)
Total Protein: 6.3 g/dL — ABNORMAL LOW (ref 6.5–8.1)

## 2021-09-01 MED ORDER — SODIUM CHLORIDE 0.9 % IV SOLN: Freq: Once | INTRAVENOUS | Status: AC

## 2021-09-01 MED ORDER — ACETAMINOPHEN 325 MG PO TABS
650.0000 mg | ORAL_TABLET | Freq: Once | ORAL | Status: AC
  Administered 2021-09-01: 650 mg via ORAL
  Filled 2021-09-01: qty 2

## 2021-09-01 MED ORDER — TRASTUZUMAB-DKST CHEMO 150 MG IV SOLR
6.0000 mg/kg | Freq: Once | INTRAVENOUS | Status: AC
  Administered 2021-09-01: 399 mg via INTRAVENOUS
  Filled 2021-09-01: qty 19

## 2021-09-01 MED ORDER — DIPHENHYDRAMINE HCL 25 MG PO CAPS
25.0000 mg | ORAL_CAPSULE | Freq: Once | ORAL | Status: AC
  Administered 2021-09-01: 25 mg via ORAL
  Filled 2021-09-01: qty 1

## 2021-09-01 MED ORDER — HEPARIN SOD (PORK) LOCK FLUSH 100 UNIT/ML IV SOLN
500.0000 [IU] | Freq: Once | INTRAVENOUS | Status: AC | PRN
  Administered 2021-09-01: 500 [IU]

## 2021-09-01 MED ORDER — SODIUM CHLORIDE 0.9 % IV SOLN
420.0000 mg | Freq: Once | INTRAVENOUS | Status: AC
  Administered 2021-09-01: 420 mg via INTRAVENOUS
  Filled 2021-09-01: qty 14

## 2021-09-01 MED ORDER — SODIUM CHLORIDE 0.9% FLUSH
10.0000 mL | INTRAVENOUS | Status: DC | PRN
  Administered 2021-09-01: 10 mL

## 2021-09-01 NOTE — Telephone Encounter (Signed)
Unable to complete call or LVM. Made 3 attempts after verifying correct number.  ?

## 2021-09-01 NOTE — Assessment & Plan Note (Signed)
12/17/2020:?Palpable lump in the upper outer left breast.?Diagnostic mammogram and [place] on?12/15/20?showed multiple suspicious masses, 2?abnormal lymph nodes with increased cortical thickness,?1 lymph node with borderline abnormal cortical thickness,?Biopsy on?12/17/20?showed invasive ductal carcinoma and DCIS, Her2+ (3+)/ER+ (80%)/PR- (<1%) in the left breast, and invasive ductal carcinoma, Her2+ (3+)/ER+ (80%)/PR+ (3%) in the left axillary lymph node ?T1CN1A stage Ib ?? ?Treatment plan: ?1. Neoadjuvant chemotherapy with [place] Perjeta ?6 cycles completed 04/26/2021 followed by Herceptin Perjeta maintenance versus Kadcyla maintenance (based on response to neoadjuvant chemo) for 1 year ?2.?Left mastectomy 05/23/2021: Negative for residual cancer. ?Extensive residual high-grade DCIS, 0/4 lymph nodes negative;?Right mastectomy: Benign  ?Complete pathologic response ?3. Followed by adjuvant radiation therapy? ?4.??Followed by adjuvant antiestrogen therapy ?-------------------------------------------------------------------------------------------------------------------------------------------------- ?Current treatment:?Herceptin Perjeta maintenance?? ?Adverse effects: None ?Return to clinic every 3 weeks for Herceptin Perjeta and every 6 weeks to follow-up with me. ? ?Because of childcare challenges she would like to see radiation on the same day that she gets her next Herceptin and Perjeta. ?

## 2021-09-01 NOTE — Progress Notes (Signed)
Patient declined to stay post observation for Perjeta. VSS and patient ambulatory to lobby.  ?

## 2021-09-01 NOTE — Progress Notes (Addendum)
[place] CSW Progress Note ? ?Clinical Social Worker met with patient due to update that pt is at risk of imminently becoming homeless. Pt reports that with her decrease in income (working part-time at [employer]) and then her mom's change due to a back surgery, they became delinquent on rent and received an eviction notice and went to court last month. They have to be out of their home by tomorrow and have found a new place to rent, but do not have enough to give first month's rent and deposit at once and are wondering about any resources to assist. In the home is pt, her twin 42 yos & teenager, and pt's mom.  ? ?Pt has already received: [place], [employer], [employer] ? ?Today: provided first [place] ? ?Referrals made to:  ?- [facility] is calling pt and provided direct contact for pt to reach her ?- [place] [facility] left VM ?- [employer]- stated they are out of funds but provided another resource- TEAM at [employer] on Tues at 9 & 2 and Wed at [employer]- information given to patient ?- [employer] ? ?Pt is also on waitlist for income-based housing. They will try to find a hotel if unable to get funds for deposit in the next 24 hours. ? ? ?CSW will follow-up with pt in the next week. ? ? ? ?Christeen Douglas LCSW ?

## 2021-09-01 NOTE — Patient Instructions (Signed)
[facility] MEDICAL ONCOLOGY  Discharge Instructions: Thank you for choosing [facility] to provide your oncology and hematology care.   If you have a lab appointment with the Cancer Center, please go directly to the Cancer Center and check in at the registration area.   Wear comfortable clothing and clothing appropriate for easy access to any Portacath or PICC line.   We strive to give you quality time with your provider. You may need to reschedule your appointment if you arrive late (15 or more minutes).  Arriving late affects you and other patients whose appointments are after yours.  Also, if you miss three or more appointments without notifying the office, you may be dismissed from the clinic at the provider's discretion.      For prescription refill requests, have your pharmacy contact our office and allow 72 hours for refills to be completed.    Today you received the following chemotherapy and/or immunotherapy agents: Trastuzumab, Pertuzumab.      To help prevent nausea and vomiting after your treatment, we encourage you to take your nausea medication as directed.  BELOW ARE SYMPTOMS THAT SHOULD BE REPORTED IMMEDIATELY: *FEVER GREATER THAN 100.4 F (38 C) OR HIGHER *CHILLS OR SWEATING *NAUSEA AND VOMITING THAT IS NOT CONTROLLED WITH YOUR NAUSEA MEDICATION *UNUSUAL SHORTNESS OF BREATH *UNUSUAL BRUISING OR BLEEDING *URINARY PROBLEMS (pain or burning when urinating, or frequent urination) *BOWEL PROBLEMS (unusual diarrhea, constipation, pain near the anus) TENDERNESS IN MOUTH AND THROAT WITH OR WITHOUT PRESENCE OF ULCERS (sore throat, sores in mouth, or a toothache) UNUSUAL RASH, SWELLING OR PAIN  UNUSUAL VAGINAL DISCHARGE OR ITCHING   Items with * indicate a potential emergency and should be followed up as soon as possible or go to the Emergency Department if any problems should occur.  Please show the CHEMOTHERAPY ALERT CARD or IMMUNOTHERAPY ALERT CARD  at check-in to the Emergency Department and triage nurse.  Should you have questions after your visit or need to cancel or reschedule your appointment, please contact [facility]  Dept: [phone]  and follow the prompts.  Office hours are 8:00 a.m. to 4:30 p.m. Monday - Friday. Please note that voicemails left after 4:00 p.m. may not be returned until the following business day.  We are closed weekends and major holidays. You have access to a nurse at all times for urgent questions. Please call the main number to the clinic Dept: [phone] and follow the prompts.   For any non-urgent questions, you may also contact your provider using MyChart. We now offer e-Visits for anyone 18 and older to request care online for non-urgent symptoms. For details visit [email or web address].   Also download the MyChart app! Go to the app store, search "MyChart", open the app, select [facility], and log in with your MyChart username and password.  Due to Covid, a mask is required upon entering the hospital/clinic. If you do not have a mask, one will be given to you upon arrival. For doctor visits, patients may have 1 support person aged 18 or older with them. For treatment visits, patients cannot have anyone with them due to current Covid guidelines and our immunocompromised population.  

## 2021-09-02 ENCOUNTER — Telehealth: Payer: Self-pay | Admitting: Licensed Clinical Social Worker

## 2021-09-02 NOTE — Telephone Encounter (Signed)
[place] CSW Progress Note ? ?TC to pt to follow-up on resources discussed yesterday. Per pt, she has an appt with [employer] to determine any assistance available on Thursday 5/11.  CSW updated pt that Nonah Mattes with [employer] will be contacting her as well to discuss possible resource through DSS as well as anything else that may be available. Provided contact information for pt to reach her if she has not left her a message already. ? ? ? ? ?Christeen Douglas , LCSW ?

## 2021-09-06 ENCOUNTER — Encounter: Payer: Self-pay | Admitting: *Deleted

## 2021-09-07 NOTE — Progress Notes (Signed)
Location of Breast Cancer:  ?Malignant neoplasm of upper-outer quadrant of left breast in female, estrogen receptor positive ? ?Histology per Pathology Report:  ?05/23/2021 ?FINAL MICROSCOPIC DIAGNOSIS:  ?A. LYMPH NODE, LEFT AXILLARY, EXCISION:  ?- One benign lymph node, negative for carcinoma (0/1)  ?- Changes consistent with prior biopsy  ?B. BREAST, RIGHT, MASTECTOMY WITH SKIN:  ?- Benign breast with nonproliferative fibrocystic changes  ?- Negative for carcinoma  ?C. 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 LYMPH NODE, LEFT AXILLARY, SENTINEL, EXCISION:  ?- One lymph node with carcinoma within a perinodal lymphovascular space  ?- Negative for tumor within the nodal tissue ([facility], 0/1)  ?- Changes compatible with neoadjuvant therapy  ?E. LYMPH NODE, LEFT AXILLARY, SENTINEL, EXCISION:  ?- One benign lymph node, negative for carcinoma (0/1)  ?- Changes consistent with neoadjuvant therapy  ?F. LYMPH NODE, LEFT AXILLARY, SENTINEL, EXCISION:  ?- One benign lymph node, negative for carcinoma (0/1)  ?- Changes consistent with neoadjuvant therapy  ? ?12/17/2020 ?1. Breast, left, needle core biopsy, 1 o'clock ?- INVASIVE DUCTAL CARCINOMA ?- SEE COMMENT ?2. Breast, left, needle core biopsy, 12:30 o'clock ?- INVASIVE DUCTAL CARCINOMA ?- SEE COMMENT ?3. Breast, left, needle core biopsy, 2 o'clock ?- INVASIVE DUCTAL CARCINOMA ?- DUCTAL CARCINOMA IN SITU ?- SEE COMMENT ?4. Lymph node, needle/core biopsy, left axilla ?- INVASIVE DUCTAL CARCINOMA ?- NO DEFINITIVE NODAL TISSUE IDENTIFIED ?- SEE COMMENT ? ?Receptor Status:  ?Left breast 1 o'clock: ER(80%), PR (<1%), Her2-neu (Positive),  Ki-67(25%) ?Left axilla lymph node: ER(80%), PR (3%), Her2-neu (Positive) ? ?Did patient present with symptoms (if so, please note symptoms) or was this found on screening mammography?: Palpable lump in the upper outer left breast. Diagnostic mammogram and [place] on 12/15/20 showed multiple suspicious masses, 2 abnormal lymph nodes with increased cortical thickness, 1 lymph node with borderline abnormal cortical thickness ? ?Past/Anticipated interventions by surgeon, if any:  ?05/23/2021 ?--Dr. Rolm Bookbinder ?Right risk reduction skin sparing mastectomy ?Left skin sparing mastectomy ?Left deep axillary sentinel lymph node biopsy ?Left radioactive seed axillary node guided excision ?Injection of mag trace for sentinel lymph node identification ?--Dr. Mingo Amber (plastic surgeon)  ?Bilateral breast reconstruction with tissue expander and acellular dermal matrix ?Indocyanine green angiography bilateral mastectomy flaps ? ?Past/Anticipated interventions by medical oncology, if any:  ?Under care of Dr. [name] ?09/01/2021 ?--Treatment plan: ?Neoadjuvant chemotherapy with Tazlina Perjeta ?6 cycles completed 04/26/2021 followed by Herceptin Perjeta maintenance versus Kadcyla maintenance (based on response to neoadjuvant chemo) for 1 year ?Left mastectomy 05/23/2021: Negative for residual cancer.  Extensive residual high-grade DCIS, 0/4 lymph nodes negative; Right mastectomy: Benign ?Complete pathologic response ?Followed by adjuvant radiation therapy  ?Followed by adjuvant antiestrogen therapy ?--Current treatment: Herceptin Perjeta maintenance   ?Adverse effects: None ?Return to clinic every 3 weeks for Herceptin Perjeta and every 6 weeks to follow-up with me. ?I sent a message to Dr. Sondra Come and Dr. Claudia Desanctis to discuss her reconstruction approach. ? ?Lymphedema issues, if any:  Patient denies   ? ?Pain issues, if any:  Patient denies any chest/breast pain. She does report abdominal cramping and generalized malaise (like her  body may be trying to restart her menstrual cycle)  ? ?SAFETY ISSUES: ?Prior radiation? No ?Pacemaker/ICD?  No ?Possible current pregnancy? No--menstrual cycle has not resumed since completing chemotherapy (reports mild spotting last week) ?Is the patient on methotrexate? No ? ?Current Complaints / other details:  Patient admits she's feeling overwhelmed with all the appointments related to her cancer treatment, as well as the stress related to rent/possible eviction ?   ? ? ? ?

## 2021-09-07 NOTE — Progress Notes (Incomplete)
?Radiation Oncology         [phone] ?________________________________ ? ?Initial Outpatient Consultation ? ?Name: Robin Horn MRN: [id number]  ?Date: 09/08/2021  DOB: [birth year] ? ?[id number], Robin Sarna DO  Robin Lose, MD  ? ?REFERRING PHYSICIAN: [name], MD ? ?DIAGNOSIS: There were no encounter diagnoses. ? ?S/p chemotherapy and bilateral mastectomies: extensive residual high-grade ductal carcinoma in situ and no residual invasive disease in the left breast ? 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 ? Cancer Staging  ?Malignant neoplasm of upper-outer quadrant of left breast in female, estrogen receptor positive ([place]) ?Staging form: Breast, AJCC 8th Edition ?- Clinical stage from 12/23/2020: Stage IB (cT2, cN1, cM0, G3, ER+, PR+, HER2+) - Signed by Robin Lose, MD on 12/23/2020 ?Stage prefix: Initial diagnosis ?Histologic grading system: 3 grade system ? ? ?HISTORY OF PRESENT ILLNESS::Robin Horn is a 36 y.o. female who is accompanied by ***. she is seen as a courtesy of Dr. Lindi Horn for an opinion concerning radiation therapy as part of management for her recently diagnosed left breast cancer.  ? ?The patient initially presented with a palpable lump in the upper outer left breast. Subsequent bilateral diagnostic mammogram and left breast ultrasound on 12/15/20 showed an irregular hypoechoic mass measuring 1.5 x 1.5 x 2.1 cm in the 1 o'clock left breast, 8 cmfn, corresponding to the palpable mass. Prior to undergoing imaging, her physician palpated a second mass like area which correlated to an irregular hypoechoic mass seen on sonogram, measuring 1.4 x 1.6 x 1.6 cm at the 2 o'clock position of the left breast. Imaging also revealed a third irregular hypoechoic mass with internal vascularity measuring 2.5 x 1.2 x 3.0 cm in the 23:30 o'clock position of the left breast, 3 cmfn, and 2 lymph nodes with increased cortical thickness and 1 with  borderline increased cortical thickness in the left axilla.   ? ?Biopsies of the 1 o'clock, 12:30 o'clock, and 2 o'clock left breast masses on 12/17/20 revealed grade 3 invasive ductal carcinoma measuring 1.4 cm in the greatest linear extent (also with DCIS in the 2 o'clock mass). Left axillary lymph node biopsy positive for metastatic carcinoma. Prognostic indicators significant for: estrogen receptor 80% positive with strong staining intensity; progesterone receptor <1% negative; Proliferation marker Ki67 at 25%; Her2 status positive; Grade 3.  ? ?Bilateral breast MRI on 01/08/22 showed the 3 dominant spiculated masses in the upper-outer quadrant of the left ?breast with interconnecting linear and nodular enhancement measuring up to 7.4 cm. Three abnormal left axillary lymph nodes were again appreciated. Right breast showed no abnormalities.  ? ?Accordingly, the patient proceeded with chemotherapy consisting of TCHP from 01/11/21 through 04/26/21 under the care of Dr. Lindi Horn. Overall, the patient tolerated chemotherapy well other than mild intermittent fatigue. ? ?[employer] genetic testing collected on 01/13/21 was negative, showing no pathogenic variants detected.  ? ?Bilateral breast MRI on 05/03/21 showed complete interval resolution of the masses and non-mass enhancements ?in the outer portion of the left breast, as well as normal morphology of the previously enlarged left axillary lymph node. Other notable findings on MRI included asymmetric flattening of the left nipple compared to the right (possibly positional), and a probably benign 7 mm mass in the left hepatic lobe.    ? ?The patient opted to proceed with bilateral mastectomies and SLN biopsies on 05/23/21 under the care of Dr. Donne Horn. Pathology from the procedure revealed no residual invasive disease in  the left breast, but extensive residual high-grade ductal carcinoma in situ. Right mastectomy showed benign findings. All SLN's negative, but with 1/4  left axillary sentinel lymph nodes positive for carcinoma within a perinodal lymphovascular space, and without tumor within the nodal tissue.  ? ?Following her procedure, the patient continued on with herceptin perjeta maintenance (every 3 weeks) beginning on 06/08/21 under the care of Dr. Lindi Horn. Planned duration is for 1 year.   ? ? ?PREVIOUS RADIATION THERAPY: No ? ?PAST MEDICAL HISTORY:  ?Past Medical History:  ?Diagnosis Date  ? Abnormal Pap smear of cervix   ? Anemia   ? Anxiety   ? Arthritis   ? Bipolar disorder ([place])   ? diagnosed years ago  ? Cancer [facility])   ? Chlamydia   ? Depression   ? Family history of breast cancer 01/18/2021  ? GDM (gestational diabetes mellitus)   ? GERD (gastroesophageal reflux disease)   ? pt used to take omeprazole, doesn't have much issues with it anymore  ? Gonorrhea   ? Herpes genitalia   ? History of hiatal hernia   ? Migraines   ? Ovarian cyst   ? Polycystic kidney disease   ? Preeclampsia   ? Pregnancy induced hypertension   ? ? ?PAST SURGICAL HISTORY: ?Past Surgical History:  ?Procedure Laterality Date  ? BREAST RECONSTRUCTION WITH PLACEMENT OF TISSUE EXPANDER AND FLEX HD (ACELLULAR HYDRATED DERMIS) Bilateral 05/23/2021  ? Procedure: BREAST RECONSTRUCTION WITH PLACEMENT OF TISSUE EXPANDER AND FLEX HD (ACELLULAR HYDRATED DERMIS);  Surgeon: Robin Presume, MD;  Location: [facility];  Service: Plastics;  Laterality: Bilateral;  ? COLPOSCOPY    ? DILATION AND CURETTAGE OF UTERUS    ? MASTECTOMY W/ SENTINEL NODE BIOPSY Left 05/23/2021  ? Procedure: LEFT MASTECTOMY WITH LEFT AXILLARY SENTINEL LYMPH NODE BIOPSY;  Surgeon: Robin Bookbinder, MD;  Location: [facility];  Service: General;  Laterality: Left;  ? PORT A CATH REVISION Right 01/31/2021  ? Procedure: PORT A CATH REVISION;  Surgeon: Robin Bookbinder, MD;  Location: [place];  Service: General;  Laterality: Right;  ? PORTACATH PLACEMENT N/A 01/06/2021  ? Procedure: INSERTION PORT-A-CATH;  Surgeon: Robin Bookbinder,  MD;  Location: [place];  Service: General;  Laterality: N/A;  ? RADIOACTIVE SEED GUIDED AXILLARY SENTINEL LYMPH NODE Left 05/23/2021  ? Procedure: RADIOACTIVE SEED GUIDED LEFT AXILLARY SENTINEL LYMPH NODE EXCISION;  Surgeon: Robin Bookbinder, MD;  Location: [place];  Service: General;  Laterality: Left;  ? TONSILLECTOMY    ? TOTAL MASTECTOMY Right 05/23/2021  ? Procedure: RIGHT TOTAL MASTECTOMY;  Surgeon: Robin Bookbinder, MD;  Location: [place];  Service: General;  Laterality: Right;  ? TUBAL LIGATION Bilateral 09/24/2016  ? Procedure: POST PARTUM TUBAL LIGATION;  Surgeon: Lavonia Drafts, MD;  Location: [facility];  Service: Gynecology;  Laterality: Bilateral;  ? ? ?FAMILY HISTORY:  ?Family History  ?Problem Relation Age of Onset  ? Kidney disease Mother   ? Breast cancer Maternal Aunt   ?     dx 6s  ? Breast cancer Other   ?     MGM's sister and MGM's niece; dx after 8  ? Other Neg Hx   ? ? ?SOCIAL HISTORY:  ?Social History  ? ?Tobacco Use  ? Smoking status: Every Day  ?  Packs/day: 0.25  ?  Types: Cigarettes  ? Smokeless tobacco: Never  ? Tobacco comments:  ?  Wants to restart Nicotine  ?Vaping Use  ? Vaping Use: Never used  ?Substance  Use Topics  ? Alcohol use: Not Currently  ?  Alcohol/week: 1.0 standard drink  ?  Types: 1 Glasses of wine per week  ? Drug use: Not Currently  ? ? ?ALLERGIES:  ?Allergies  ?Allergen Reactions  ? Hydrocodone-Acetaminophen Hives  ? Tramadol Itching  ? Lactose Intolerance (Gi) Diarrhea  ? ? ?MEDICATIONS:  ?Current Outpatient Medications  ?Medication Sig Dispense Refill  ? acetaminophen (TYLENOL) 500 MG tablet Take 1,000 mg by mouth every 6 (six) hours as needed (pain).    ? baclofen (LIORESAL) 10 MG tablet Take 1 tablet by mouth 3 times daily. 30 each 0  ? lidocaine (XYLOCAINE) 2 % solution Use as directed 15 mLs in the mouth or throat every 3 (three) hours as needed for mouth pain. 100 mL 0  ? lidocaine-prilocaine (EMLA) cream Apply 1 application. topically as needed.  30 g 0  ? lisinopril (ZESTRIL) 10 MG tablet Take 10 mg by mouth daily.    ? zolpidem (AMBIEN) 5 MG tablet Take 1 tablet (5 mg total) by mouth at bedtime as needed. 30 tablet 1  ? ?No current facility-adminis

## 2021-09-08 ENCOUNTER — Ambulatory Visit: Admission: RE | Admit: 2021-09-08 | Payer: Medicaid Other | Source: Ambulatory Visit

## 2021-09-08 ENCOUNTER — Encounter: Payer: Self-pay | Admitting: *Deleted

## 2021-09-08 ENCOUNTER — Ambulatory Visit
Admission: RE | Admit: 2021-09-08 | Discharge: 2021-09-08 | Disposition: A | Discharge status: Home / Self Care | Payer: Medicaid Other | Source: Ambulatory Visit | Attending: Radiation Oncology | Admitting: Radiation Oncology

## 2021-09-08 DIAGNOSIS — Z170 Estrogen receptor positive status [ER+]: Secondary | ICD-10-CM

## 2021-09-09 ENCOUNTER — Telehealth: Payer: Self-pay | Admitting: Licensed Clinical Social Worker

## 2021-09-09 ENCOUNTER — Other Ambulatory Visit (HOSPITAL_COMMUNITY): Payer: Self-pay

## 2021-09-09 ENCOUNTER — Telehealth: Payer: Self-pay | Admitting: Radiation Oncology

## 2021-09-09 NOTE — Telephone Encounter (Signed)
[facility] CSW Progress Note ? ?Clinical Social Worker  attempted to contact pt by phone  to follow-up on housing concerns. No answer. Left VM with direct contact information. ? ? ? ?Shukri Nistler E Richard Holz, LCSW  ?

## 2021-09-09 NOTE — Progress Notes (Signed)
Dr. Sondra Come unable to connect with patient over the phone to discuss radiation. Will ask admin team to reschedule patient to another date/time ?

## 2021-09-09 NOTE — Telephone Encounter (Signed)
LVM on both home and mobile #s to r/s missed appt with Dr. Sondra Come. ?

## 2021-09-11 ENCOUNTER — Other Ambulatory Visit: Payer: Self-pay | Admitting: Hematology and Oncology

## 2021-09-12 ENCOUNTER — Encounter
Payer: Medicaid Other | Attending: Physical Medicine and Rehabilitation | Admitting: Physical Medicine and Rehabilitation

## 2021-09-13 ENCOUNTER — Encounter: Payer: Self-pay | Admitting: Hematology and Oncology

## 2021-09-13 NOTE — Progress Notes (Signed)
?Radiation Oncology         [phone]) [phone] ?________________________________ ? ?Initial Outpatient Consultation ? ?Name: Robin Horn MRN: [id number]  ?Date: 09/14/2021  DOB: December 07, 1985 ? ?KA:JGOT, [name] DO  [name], MD  ? ?REFERRING PHYSICIAN: [name], MD ? ?DIAGNOSIS: The encounter diagnosis was Malignant neoplasm of upper-outer quadrant of left breast in female, estrogen receptor positive ([place]). ? ?S/p chemotherapy and bilateral mastectomies: extensive residual high-grade ductal carcinoma in situ and no residual invasive disease in the left breast ? 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 ? Cancer Staging  ?Malignant neoplasm of upper-outer quadrant of left breast in female, estrogen receptor positive ([place]) ?Staging form: Breast, AJCC 8th Edition ?- Clinical stage from 12/23/2020: Stage IB (cT2, cN1, cM0, G3, ER+, PR+, HER2+) - Signed by [name], MD on 12/23/2020 ?Stage prefix: Initial diagnosis ?Histologic grading system: 3 grade system ? ? ?HISTORY OF PRESENT ILLNESS::Robin Horn is a 36 y.o. female who is seen as a courtesy of Dr. Lindi Adie for an opinion concerning radiation therapy as part of management for her recently diagnosed left breast cancer.  ? ?The patient initially presented with a palpable lump in the upper outer left breast. Subsequent bilateral diagnostic mammogram and left breast ultrasound on 12/15/20 showed an irregular hypoechoic mass measuring 1.5 x 1.5 x 2.1 cm in the 1 o'clock left breast, 8 cmfn, corresponding to the palpable mass. Prior to undergoing imaging, her physician palpated a second mass like area which correlated to an irregular hypoechoic mass seen on sonogram, measuring 1.4 x 1.6 x 1.6 cm at the 2 o'clock position of the left breast. Imaging also revealed a third irregular hypoechoic mass with internal vascularity measuring 2.5 x 1.2 x 3.0 cm in the 23:30 o'clock position of the left breast, 3 cmfn,  and 2 lymph nodes with increased cortical thickness and 1 with borderline increased cortical thickness in the left axilla.   ? ?Biopsies of the 1 o'clock, 12:30 o'clock, and 2 o'clock left breast masses on 12/17/20 revealed grade 3 invasive ductal carcinoma measuring 1.4 cm in the greatest linear extent (also with DCIS in the 2 o'clock mass). Left axillary lymph node biopsy positive for metastatic carcinoma. Prognostic indicators significant for: estrogen receptor 80% positive with strong staining intensity; progesterone receptor <1% negative; Proliferation marker Ki67 at 25%; Her2 status positive; Grade 3.  ? ?Bilateral breast MRI on 01/08/22 showed the 3 dominant spiculated masses in the upper-outer quadrant of the left ?breast with interconnecting linear and nodular enhancement measuring up to 7.4 cm. Three abnormal left axillary lymph nodes were again appreciated. Right breast showed no abnormalities.  ? ?Accordingly, the patient proceeded with chemotherapy consisting of TCHP from 01/11/21 through 04/26/21 under the care of Dr. Lindi Adie. Overall, the patient tolerated chemotherapy well other than mild intermittent fatigue. ? ?[employer] genetic testing collected on 01/13/21 was negative, showing no pathogenic variants detected.  ? ?Bilateral breast MRI on 05/03/21 showed complete interval resolution of the masses and non-mass enhancements in the outer portion of the left breast, as well as normal morphology of the previously enlarged left axillary lymph node. Other notable findings on MRI included asymmetric flattening of the left nipple compared to the right (possibly positional), and a probably benign 7 mm mass in the left hepatic lobe.    ? ?The patient opted to proceed with bilateral mastectomies and left SLN biopsies on 05/23/21 under the care of Dr. Donne Hazel.  She underwent a right risk-reduction skin sparing mastectomy.  She underwent a left skin sparing mastectomy.  She then underwent immediate bilateral  breast reconstruction with tissue expander and acellular dermal matrix.  She had a [place] CPX 4 medium height expanders placed by Dr. Claudia Desanctis.  Fill volume of 450 cc. pathology from the procedure revealed no residual invasive disease in the left breast, but extensive residual high-grade ductal carcinoma in situ. Right mastectomy showed benign findings. All SLN's negative, but with 1/4 left axillary sentinel lymph nodes positive for carcinoma within a perinodal lymphovascular space, and without tumor within the nodal tissue.  ? ?Following her procedure, the patient continued on with herceptin perjeta maintenance (every 3 weeks) beginning on 06/08/21 under the care of Dr. Lindi Adie. Planned duration is for 1 year.   ? ?She was then to proceed with rapid tissue expansion under the direction of Dr. Claudia Desanctis however due to her social situation this never occurred. ? ? She has missed several scheduled consultation appointments with me due to her social situation. ? ?She is now seen in radiation oncology for consideration for postmastectomy radiation therapy along the left side given her risk for recurrence in this region.  ? ?PREVIOUS RADIATION THERAPY: No ? ?PAST MEDICAL HISTORY:  ?Past Medical History:  ?Diagnosis Date  ? Abnormal Pap smear of cervix   ? Anemia   ? Anxiety   ? Arthritis   ? Bipolar disorder ([place])   ? diagnosed years ago  ? Cancer [facility])   ? Chlamydia   ? Depression   ? Family history of breast cancer 01/18/2021  ? GDM (gestational diabetes mellitus)   ? GERD (gastroesophageal reflux disease)   ? pt used to take omeprazole, doesn't have much issues with it anymore  ? Gonorrhea   ? Herpes genitalia   ? History of hiatal hernia   ? Migraines   ? Ovarian cyst   ? Polycystic kidney disease   ? Preeclampsia   ? Pregnancy induced hypertension   ? ? ?PAST SURGICAL HISTORY: ?Past Surgical History:  ?Procedure Laterality Date  ? BREAST RECONSTRUCTION WITH PLACEMENT OF TISSUE EXPANDER AND FLEX HD (ACELLULAR HYDRATED DERMIS)  Bilateral 05/23/2021  ? Procedure: BREAST RECONSTRUCTION WITH PLACEMENT OF TISSUE EXPANDER AND FLEX HD (ACELLULAR HYDRATED DERMIS);  Surgeon: Cindra Presume, MD;  Location: [place];  Service: Plastics;  Laterality: Bilateral;  ? COLPOSCOPY    ? DILATION AND CURETTAGE OF UTERUS    ? MASTECTOMY W/ SENTINEL NODE BIOPSY Left 05/23/2021  ? Procedure: LEFT MASTECTOMY WITH LEFT AXILLARY SENTINEL LYMPH NODE BIOPSY;  Surgeon: Rolm Bookbinder, MD;  Location: [place];  Service: General;  Laterality: Left;  ? PORT A CATH REVISION Right 01/31/2021  ? Procedure: PORT A CATH REVISION;  Surgeon: Rolm Bookbinder, MD;  Location: [place];  Service: General;  Laterality: Right;  ? PORTACATH PLACEMENT N/A 01/06/2021  ? Procedure: INSERTION PORT-A-CATH;  Surgeon: Rolm Bookbinder, MD;  Location: [place];  Service: General;  Laterality: N/A;  ? RADIOACTIVE SEED GUIDED AXILLARY SENTINEL LYMPH NODE Left 05/23/2021  ? Procedure: RADIOACTIVE SEED GUIDED LEFT AXILLARY SENTINEL LYMPH NODE EXCISION;  Surgeon: Rolm Bookbinder, MD;  Location: [place];  Service: General;  Laterality: Left;  ? TONSILLECTOMY    ? TOTAL MASTECTOMY Right 05/23/2021  ? Procedure: RIGHT TOTAL MASTECTOMY;  Surgeon: Rolm Bookbinder, MD;  Location: [place];  Service: General;  Laterality: Right;  ? TUBAL LIGATION Bilateral 09/24/2016  ? Procedure: POST PARTUM TUBAL LIGATION;  Surgeon: Lavonia Drafts,  MD;  Location: [place];  Service: Gynecology;  Laterality: Bilateral;  ? ? ?FAMILY HISTORY:  ?Family History  ?Problem Relation Age of Onset  ? Kidney disease Mother   ? Breast cancer Maternal Aunt   ?     dx 31s  ? Breast cancer Other   ?     MGM's sister and MGM's niece; dx after 59  ? Other Neg Hx   ? ? ?SOCIAL HISTORY:  ?Social History  ? ?Tobacco Use  ? Smoking status: Every Day  ?  Packs/day: 0.25  ?  Types: Cigarettes  ? Smokeless tobacco: Never  ? Tobacco comments:  ?  Wants to restart Nicotine  ?Vaping Use  ? Vaping Use: Never used   ?Substance Use Topics  ? Alcohol use: Not Currently  ?  Alcohol/week: 1.0 standard drink  ?  Types: 1 Glasses of wine per week  ? Drug use: Not Currently  ? ? ?ALLERGIES:  ?Allergies  ?Allergen Reactions

## 2021-09-14 ENCOUNTER — Other Ambulatory Visit: Payer: Self-pay

## 2021-09-14 ENCOUNTER — Ambulatory Visit
Admission: RE | Admit: 2021-09-14 | Discharge: 2021-09-14 | Disposition: A | Discharge status: Home / Self Care | Payer: Commercial Managed Care - HMO | Source: Ambulatory Visit | Attending: Radiation Oncology | Admitting: Radiation Oncology

## 2021-09-14 VITALS — BP 133/97 | HR 95 | Temp 97.7°F | Resp 18 | Ht 62.0 in | Wt 151.0 lb

## 2021-09-14 DIAGNOSIS — K219 Gastro-esophageal reflux disease without esophagitis: Secondary | ICD-10-CM | POA: Diagnosis not present

## 2021-09-14 DIAGNOSIS — Z803 Family history of malignant neoplasm of breast: Secondary | ICD-10-CM | POA: Diagnosis not present

## 2021-09-14 DIAGNOSIS — Z9221 Personal history of antineoplastic chemotherapy: Secondary | ICD-10-CM | POA: Diagnosis not present

## 2021-09-14 DIAGNOSIS — C50412 Malignant neoplasm of upper-outer quadrant of left female breast: Secondary | ICD-10-CM | POA: Diagnosis not present

## 2021-09-14 DIAGNOSIS — K449 Diaphragmatic hernia without obstruction or gangrene: Secondary | ICD-10-CM | POA: Insufficient documentation

## 2021-09-14 DIAGNOSIS — F17210 Nicotine dependence, cigarettes, uncomplicated: Secondary | ICD-10-CM | POA: Insufficient documentation

## 2021-09-14 DIAGNOSIS — Z170 Estrogen receptor positive status [ER+]: Secondary | ICD-10-CM | POA: Diagnosis not present

## 2021-09-15 ENCOUNTER — Encounter: Payer: Self-pay | Admitting: *Deleted

## 2021-09-16 ENCOUNTER — Encounter: Payer: Self-pay | Admitting: Hematology and Oncology

## 2021-09-19 ENCOUNTER — Ambulatory Visit
Admission: RE | Admit: 2021-09-19 | Discharge: 2021-09-19 | Disposition: A | Discharge status: Home / Self Care | Payer: Commercial Managed Care - HMO | Source: Ambulatory Visit | Attending: Radiation Oncology | Admitting: Radiation Oncology

## 2021-09-19 ENCOUNTER — Other Ambulatory Visit: Payer: Self-pay

## 2021-09-19 DIAGNOSIS — Z510 Encounter for antineoplastic radiation therapy: Secondary | ICD-10-CM | POA: Insufficient documentation

## 2021-09-19 DIAGNOSIS — C50412 Malignant neoplasm of upper-outer quadrant of left female breast: Secondary | ICD-10-CM | POA: Diagnosis not present

## 2021-09-19 DIAGNOSIS — Z170 Estrogen receptor positive status [ER+]: Secondary | ICD-10-CM | POA: Insufficient documentation

## 2021-09-20 DIAGNOSIS — Z170 Estrogen receptor positive status [ER+]: Secondary | ICD-10-CM | POA: Diagnosis not present

## 2021-09-20 DIAGNOSIS — C50412 Malignant neoplasm of upper-outer quadrant of left female breast: Secondary | ICD-10-CM | POA: Diagnosis not present

## 2021-09-20 DIAGNOSIS — Z510 Encounter for antineoplastic radiation therapy: Secondary | ICD-10-CM | POA: Diagnosis not present

## 2021-09-22 ENCOUNTER — Inpatient Hospital Stay: Payer: Medicaid Other

## 2021-09-22 ENCOUNTER — Telehealth: Payer: Self-pay | Admitting: *Deleted

## 2021-09-22 NOTE — Telephone Encounter (Signed)
RN attempt x1 to contact pt regarding missed lab/flush and infusion appt today.  No answer, LVM stating appts will be rescheduled.  RN sent scheduling message.

## 2021-09-27 ENCOUNTER — Encounter: Payer: Self-pay | Admitting: *Deleted

## 2021-09-27 ENCOUNTER — Other Ambulatory Visit: Payer: Self-pay

## 2021-09-27 ENCOUNTER — Ambulatory Visit
Admission: RE | Admit: 2021-09-27 | Discharge: 2021-09-27 | Disposition: A | Discharge status: Home / Self Care | Payer: Commercial Managed Care - HMO | Source: Ambulatory Visit | Attending: Radiation Oncology | Admitting: Radiation Oncology

## 2021-09-27 DIAGNOSIS — C50412 Malignant neoplasm of upper-outer quadrant of left female breast: Secondary | ICD-10-CM | POA: Diagnosis not present

## 2021-09-27 DIAGNOSIS — Z510 Encounter for antineoplastic radiation therapy: Secondary | ICD-10-CM | POA: Diagnosis not present

## 2021-09-27 DIAGNOSIS — Z170 Estrogen receptor positive status [ER+]: Secondary | ICD-10-CM | POA: Diagnosis not present

## 2021-09-27 LAB — RAD ONC ARIA SESSION SUMMARY

## 2021-09-28 ENCOUNTER — Ambulatory Visit: Admission: RE | Admit: 2021-09-28 | Payer: Commercial Managed Care - HMO | Source: Ambulatory Visit

## 2021-09-29 ENCOUNTER — Ambulatory Visit
Admission: RE | Admit: 2021-09-29 | Discharge: 2021-09-29 | Disposition: A | Discharge status: Home / Self Care | Payer: Commercial Managed Care - HMO | Source: Ambulatory Visit | Attending: Radiation Oncology | Admitting: Radiation Oncology

## 2021-09-29 ENCOUNTER — Other Ambulatory Visit: Payer: Self-pay

## 2021-09-29 DIAGNOSIS — Z510 Encounter for antineoplastic radiation therapy: Secondary | ICD-10-CM | POA: Diagnosis not present

## 2021-09-29 DIAGNOSIS — Z5112 Encounter for antineoplastic immunotherapy: Secondary | ICD-10-CM | POA: Diagnosis not present

## 2021-09-29 DIAGNOSIS — Z170 Estrogen receptor positive status [ER+]: Secondary | ICD-10-CM | POA: Insufficient documentation

## 2021-09-29 DIAGNOSIS — C50412 Malignant neoplasm of upper-outer quadrant of left female breast: Secondary | ICD-10-CM | POA: Insufficient documentation

## 2021-09-29 DIAGNOSIS — Z79899 Other long term (current) drug therapy: Secondary | ICD-10-CM | POA: Diagnosis not present

## 2021-09-29 DIAGNOSIS — R892 Abnormal level of other drugs, medicaments and biological substances in specimens from other organs, systems and tissues: Secondary | ICD-10-CM | POA: Insufficient documentation

## 2021-09-29 DIAGNOSIS — Z419 Encounter for procedure for purposes other than remedying health state, unspecified: Secondary | ICD-10-CM | POA: Diagnosis not present

## 2021-09-29 LAB — RAD ONC ARIA SESSION SUMMARY
Course Elapsed Days: 2
Plan Fractions Treated to Date: 1
Plan Fractions Treated to Date: 2
Plan Prescribed Dose Per Fraction: 2 Gy
Plan Prescribed Dose Per Fraction: 2 Gy
Plan Total Fractions Prescribed: 12
Plan Total Fractions Prescribed: 25
Plan Total Prescribed Dose: 24 Gy
Plan Total Prescribed Dose: 50 Gy
Reference Point Dosage Given to Date: 4 Gy
Reference Point Dosage Given to Date: 4 Gy
Reference Point Session Dosage Given: 2 Gy
Reference Point Session Dosage Given: 2 Gy
Session Number: 2

## 2021-09-30 ENCOUNTER — Other Ambulatory Visit: Payer: Self-pay

## 2021-09-30 ENCOUNTER — Inpatient Hospital Stay: Payer: Commercial Managed Care - HMO

## 2021-09-30 ENCOUNTER — Inpatient Hospital Stay: Payer: Commercial Managed Care - HMO | Attending: Hematology and Oncology

## 2021-09-30 ENCOUNTER — Other Ambulatory Visit: Payer: Self-pay | Admitting: *Deleted

## 2021-09-30 ENCOUNTER — Ambulatory Visit
Admission: RE | Admit: 2021-09-30 | Discharge: 2021-09-30 | Disposition: A | Discharge status: Home / Self Care | Payer: Commercial Managed Care - HMO | Source: Ambulatory Visit | Attending: Radiation Oncology | Admitting: Radiation Oncology

## 2021-09-30 VITALS — BP 136/99 | HR 97 | Temp 98.7°F | Resp 18 | Wt 157.8 lb

## 2021-09-30 DIAGNOSIS — R892 Abnormal level of other drugs, medicaments and biological substances in specimens from other organs, systems and tissues: Secondary | ICD-10-CM

## 2021-09-30 DIAGNOSIS — C50412 Malignant neoplasm of upper-outer quadrant of left female breast: Secondary | ICD-10-CM

## 2021-09-30 DIAGNOSIS — Z510 Encounter for antineoplastic radiation therapy: Secondary | ICD-10-CM | POA: Diagnosis not present

## 2021-09-30 DIAGNOSIS — Z5112 Encounter for antineoplastic immunotherapy: Secondary | ICD-10-CM | POA: Diagnosis not present

## 2021-09-30 DIAGNOSIS — Z79899 Other long term (current) drug therapy: Secondary | ICD-10-CM | POA: Insufficient documentation

## 2021-09-30 DIAGNOSIS — Z95828 Presence of other vascular implants and grafts: Secondary | ICD-10-CM

## 2021-09-30 LAB — RAD ONC ARIA SESSION SUMMARY
Course Elapsed Days: 3
Plan Fractions Treated to Date: 2
Plan Fractions Treated to Date: 3
Plan Prescribed Dose Per Fraction: 2 Gy
Plan Prescribed Dose Per Fraction: 2 Gy
Plan Total Fractions Prescribed: 13
Plan Total Fractions Prescribed: 25
Plan Total Prescribed Dose: 26 Gy
Plan Total Prescribed Dose: 50 Gy
Reference Point Dosage Given to Date: 6 Gy
Reference Point Dosage Given to Date: 6 Gy
Reference Point Session Dosage Given: 2 Gy
Reference Point Session Dosage Given: 2 Gy
Session Number: 3

## 2021-09-30 LAB — CBC WITH DIFFERENTIAL (CANCER CENTER ONLY)
Abs Immature Granulocytes: 0.02 10*3/uL (ref 0.00–0.07)
Basophils Absolute: 0 10*3/uL (ref 0.0–0.1)
Basophils Relative: 0 %
Eosinophils Absolute: 0.5 10*3/uL (ref 0.0–0.5)
Eosinophils Relative: 7 %
HCT: 31.5 % — ABNORMAL LOW (ref 36.0–46.0)
Hemoglobin: 10.4 g/dL — ABNORMAL LOW (ref 12.0–15.0)
Immature Granulocytes: 0 %
Lymphocytes Relative: 28 %
Lymphs Abs: 2.1 10*3/uL (ref 0.7–4.0)
MCH: 27.9 pg (ref 26.0–34.0)
MCHC: 33 g/dL (ref 30.0–36.0)
MCV: 84.5 fL (ref 80.0–100.0)
Monocytes Absolute: 0.5 10*3/uL (ref 0.1–1.0)
Monocytes Relative: 7 %
Neutro Abs: 4.3 10*3/uL (ref 1.7–7.7)
Neutrophils Relative %: 58 %
Platelet Count: 231 10*3/uL (ref 150–400)
RBC: 3.73 MIL/uL — ABNORMAL LOW (ref 3.87–5.11)
RDW: 15.4 % (ref 11.5–15.5)
WBC Count: 7.4 10*3/uL (ref 4.0–10.5)
nRBC: 0 % (ref 0.0–0.2)

## 2021-09-30 LAB — CMP (CANCER CENTER ONLY)
ALT: 14 U/L (ref 0–44)
AST: 13 U/L — ABNORMAL LOW (ref 15–41)
Albumin: 3.3 g/dL — ABNORMAL LOW (ref 3.5–5.0)
Alkaline Phosphatase: 66 U/L (ref 38–126)
Anion gap: 5 (ref 5–15)
BUN: 15 mg/dL (ref 6–20)
CO2: 30 mmol/L (ref 22–32)
Calcium: 9.2 mg/dL (ref 8.9–10.3)
Chloride: 105 mmol/L (ref 98–111)
Creatinine: 1.24 mg/dL — ABNORMAL HIGH (ref 0.44–1.00)
GFR, Estimated: 59 mL/min — ABNORMAL LOW (ref >60)
Glucose, Bld: 105 mg/dL — ABNORMAL HIGH (ref 70–99)
Potassium: 3.6 mmol/L (ref 3.5–5.1)
Sodium: 140 mmol/L (ref 135–145)
Total Bilirubin: 0.4 mg/dL (ref 0.3–1.2)
Total Protein: 7.2 g/dL (ref 6.5–8.1)

## 2021-09-30 MED ORDER — HEPARIN SOD (PORK) LOCK FLUSH 100 UNIT/ML IV SOLN
500.0000 [IU] | Freq: Once | INTRAVENOUS | Status: AC | PRN
  Administered 2021-09-30: 500 [IU]

## 2021-09-30 MED ORDER — ACETAMINOPHEN 325 MG PO TABS
650.0000 mg | ORAL_TABLET | Freq: Once | ORAL | Status: AC
  Administered 2021-09-30: 650 mg via ORAL
  Filled 2021-09-30: qty 2

## 2021-09-30 MED ORDER — SODIUM CHLORIDE 0.9 % IV SOLN
420.0000 mg | Freq: Once | INTRAVENOUS | Status: AC
  Administered 2021-09-30: 420 mg via INTRAVENOUS
  Filled 2021-09-30: qty 14

## 2021-09-30 MED ORDER — TRASTUZUMAB-DKST CHEMO 150 MG IV SOLR
6.0000 mg/kg | Freq: Once | INTRAVENOUS | Status: AC
  Administered 2021-09-30: 399 mg via INTRAVENOUS
  Filled 2021-09-30: qty 19

## 2021-09-30 MED ORDER — SODIUM CHLORIDE 0.9% FLUSH
10.0000 mL | INTRAVENOUS | Status: DC | PRN
  Administered 2021-09-30: 10 mL

## 2021-09-30 MED ORDER — DIPHENHYDRAMINE HCL 25 MG PO CAPS
25.0000 mg | ORAL_CAPSULE | Freq: Once | ORAL | Status: AC
  Administered 2021-09-30: 25 mg via ORAL
  Filled 2021-09-30: qty 1

## 2021-09-30 MED ORDER — SODIUM CHLORIDE 0.9 % IV SOLN: Freq: Once | INTRAVENOUS | Status: AC

## 2021-09-30 MED ORDER — SODIUM CHLORIDE 0.9% FLUSH
10.0000 mL | Freq: Once | INTRAVENOUS | Status: AC
  Administered 2021-09-30: 10 mL

## 2021-09-30 NOTE — Patient Instructions (Signed)
[facility] MEDICAL [facility]COLOGY  Discharge Instructions: Thank you for choosing [facility] to provide your oncology and hematology care.   If you have a lab appointment with the Cancer Center, please go directly to the Cancer Center and check in at the registration area.   Wear comfortable clothing and clothing appropriate for easy access to any Portacath or PICC line.   We strive to give you quality time with your provider. You may need to reschedule your appointment if you arrive late (15 or more minutes).  Arriving late affects you and other patients whose appointments are after yours.  Also, if you miss three or more appointments without notifying the office, you may be dismissed from the clinic at the provider's discretion.      For prescription refill requests, have your pharmacy contact our office and allow 72 hours for refills to be completed.    Today you received the following chemotherapy and/or immunotherapy agents: Trastuzumab, Pertuzumab.      To help prevent nausea and vomiting after your treatment, we encourage you to take your nausea medication as directed.  BELOW ARE SYMPTOMS THAT SHOULD BE REPORTED IMMEDIATELY: *FEVER GREATER THAN 100.4 F (38 C) OR HIGHER *CHILLS OR SWEATING *NAUSEA AND VOMITING THAT IS NOT CONTROLLED WITH YOUR NAUSEA MEDICATION *UNUSUAL SHORTNESS OF BREATH *UNUSUAL BRUISING OR BLEEDING *URINARY PROBLEMS (pain or burning when urinating, or frequent urination) *BOWEL PROBLEMS (unusual diarrhea, constipation, pain near the anus) TENDERNESS IN MOUTH AND THROAT WITH OR WITHOUT PRESENCE OF ULCERS (sore throat, sores in mouth, or a toothache) UNUSUAL RASH, SWELLING OR PAIN  UNUSUAL VAGINAL DISCHARGE OR ITCHING   Items with * indicate a potential emergency and should be followed up as soon as possible or go to the Emergency Department if any problems should occur.  Please show the CHEMOTHERAPY ALERT CARD or IMMUNOTHERAPY ALERT CARD  at check-in to the Emergency Department and triage nurse.  Should you have questions after your visit or need to cancel or reschedule your appointment, please contact [facility]  Dept: [phone]  and follow the prompts.  Office hours are 8:00 a.m. to 4:30 p.m. Monday - Friday. Please note that voicemails left after 4:00 p.m. may not be returned until the following business day.  We are closed weekends and major holidays. You have access to a nurse at all times for urgent questions. Please call the main number to the clinic Dept: [phone] and follow the prompts.   For any non-urgent questions, you may also contact your provider using MyChart. We now offer e-Visits for anyone 18 and older to request care online for non-urgent symptoms. For details visit [email or web address].   Also download the MyChart app! Go to the app store, search "MyChart", open the app, select , and log in with your MyChart username and password.  Due to Covid, a mask is required upon entering the hospital/clinic. If you do not have a mask, one will be given to you upon arrival. For doctor visits, patients may have 1 support person aged 18 or older with them. For treatment visits, patients cannot have anyone with them due to current Covid guidelines and our immunocompromised population.  

## 2021-09-30 NOTE — Progress Notes (Signed)
Dr. Chryl Heck standing in for Dr. Lindi Adie- reviewed outdated ECHO and given permission to treat today as long as patient is not clinically symptomatic. Patient has no SOB, edema or any overt issues associated with cardiac dysfunction. Patient to be scheduled for new ECHO-  VSS- BP (!) 136/99 (BP Location: Right Arm, Patient Position: Sitting)   Pulse 97   Temp 98.7 F (37.1 C) (Oral)   Resp 18   Wt 157 lb 12 oz (71.6 kg)   SpO2 100%   BMI 28.85 kg/m  Patient chose not to stay for her 30 minute observation period. No complaints or concerns upon discharge. Attempted to find overnight housing with SW, but was unable to find resources beyond what they had previously presented her with- has possible family member tonight?

## 2021-10-03 ENCOUNTER — Ambulatory Visit: Payer: Commercial Managed Care - HMO

## 2021-10-03 ENCOUNTER — Telehealth: Payer: Self-pay

## 2021-10-03 ENCOUNTER — Telehealth: Payer: Self-pay | Admitting: *Deleted

## 2021-10-03 NOTE — Telephone Encounter (Signed)
Called  patient due to several cancelled radiation treatments, per patient she is severely constipated, patient states she has taken milk of magnesia and suppositories without relief.

## 2021-10-03 NOTE — Telephone Encounter (Signed)
Received call from pt with complaint of constipation.  Pt states she is still passing gas and requesting advice from RN.  RN educated pt on OTC milk of magnesium as well as OTC glycerin suppositories. Pt verbalized understanding.

## 2021-10-04 ENCOUNTER — Encounter: Payer: Self-pay | Admitting: Hematology and Oncology

## 2021-10-04 ENCOUNTER — Other Ambulatory Visit: Payer: Self-pay

## 2021-10-04 ENCOUNTER — Ambulatory Visit: Payer: Commercial Managed Care - HMO

## 2021-10-04 ENCOUNTER — Inpatient Hospital Stay: Payer: Commercial Managed Care - HMO | Admitting: Licensed Clinical Social Worker

## 2021-10-04 ENCOUNTER — Ambulatory Visit
Admission: RE | Admit: 2021-10-04 | Discharge: 2021-10-04 | Disposition: A | Discharge status: Home / Self Care | Payer: Commercial Managed Care - HMO | Source: Ambulatory Visit | Attending: Radiation Oncology | Admitting: Radiation Oncology

## 2021-10-04 DIAGNOSIS — Z510 Encounter for antineoplastic radiation therapy: Secondary | ICD-10-CM | POA: Diagnosis not present

## 2021-10-04 DIAGNOSIS — Z5112 Encounter for antineoplastic immunotherapy: Secondary | ICD-10-CM | POA: Diagnosis not present

## 2021-10-04 DIAGNOSIS — C50412 Malignant neoplasm of upper-outer quadrant of left female breast: Secondary | ICD-10-CM

## 2021-10-04 DIAGNOSIS — Z79899 Other long term (current) drug therapy: Secondary | ICD-10-CM | POA: Diagnosis not present

## 2021-10-04 LAB — RAD ONC ARIA SESSION SUMMARY
Course Elapsed Days: 7
Plan Fractions Treated to Date: 2
Plan Fractions Treated to Date: 4
Plan Prescribed Dose Per Fraction: 2 Gy
Plan Prescribed Dose Per Fraction: 2 Gy
Plan Total Fractions Prescribed: 12
Plan Total Fractions Prescribed: 25
Plan Total Prescribed Dose: 24 Gy
Plan Total Prescribed Dose: 50 Gy
Reference Point Dosage Given to Date: 8 Gy
Reference Point Dosage Given to Date: 8 Gy
Reference Point Session Dosage Given: 2 Gy
Reference Point Session Dosage Given: 2 Gy
Session Number: 4

## 2021-10-04 MED ORDER — ALRA NON-METALLIC DEODORANT (RAD-ONC)
1.0000 "application " | Freq: Once | TOPICAL | Status: AC
  Administered 2021-10-04: 1 via TOPICAL

## 2021-10-04 MED ORDER — RADIAPLEXRX EX GEL: Freq: Once | CUTANEOUS | Status: AC

## 2021-10-04 NOTE — Progress Notes (Signed)
[facility] CSW Progress Note  Holiday representative met with patient to follow-up on housing concerns. Patient is now homeless. She has been able to do some nights in a hotel but other nights are in the car. She went to [employer], but they were unable to assist. Pt has not been in touch with [employer].  Per pt, she is still waiting on the income-based housing apartment as the complex needed to do repairs and now need it to be re-inspected before she can move in.   CSW provided contact information for [facility] as well as information on the [facility]. CSW offered to complete the Vi-SPDAT form for referral to [facility], but pt unable to say to complete. Pt stated that she will call the coordinated entry line herself.  CSW plans to follow-up with pt by phone.  SDOH Interventions    Flowsheet Row Most Recent Value  SDOH Interventions   Financial Strain Interventions Other (Comment)  [PEH]  Housing Interventions Other (Comment)  [PEH referral, IRC]        Stanford Strauch E Khiry Pasquariello, LCSW    Patient is participating in a Managed Medicaid Plan:  Yes

## 2021-10-04 NOTE — Progress Notes (Signed)
Pt here for patient teaching.    Pt given Radiation and You booklet, skin care instructions, Alra deodorant, and Radiaplex gel.    Reviewed areas of pertinence such as fatigue, hair loss in treatment field, skin changes, breast tenderness, and breast swelling .   Pt able to give teach back of to pat skin and use unscented/gentle soap,apply Radiaplex bid, avoid applying anything to skin within 4 hours of treatment, avoid wearing an under wire bra, and to use an electric razor if they must shave.   Pt verbalizes understanding of information given and will contact nursing with any questions or concerns.          

## 2021-10-05 ENCOUNTER — Other Ambulatory Visit: Payer: Self-pay

## 2021-10-05 ENCOUNTER — Ambulatory Visit
Admission: RE | Admit: 2021-10-05 | Discharge: 2021-10-05 | Disposition: A | Discharge status: Home / Self Care | Payer: Commercial Managed Care - HMO | Source: Ambulatory Visit | Attending: Radiation Oncology | Admitting: Radiation Oncology

## 2021-10-05 DIAGNOSIS — Z79899 Other long term (current) drug therapy: Secondary | ICD-10-CM | POA: Diagnosis not present

## 2021-10-05 DIAGNOSIS — Z510 Encounter for antineoplastic radiation therapy: Secondary | ICD-10-CM | POA: Diagnosis not present

## 2021-10-05 DIAGNOSIS — C50412 Malignant neoplasm of upper-outer quadrant of left female breast: Secondary | ICD-10-CM | POA: Diagnosis not present

## 2021-10-05 DIAGNOSIS — Z5112 Encounter for antineoplastic immunotherapy: Secondary | ICD-10-CM | POA: Diagnosis not present

## 2021-10-05 LAB — RAD ONC ARIA SESSION SUMMARY
Course Elapsed Days: 8
Plan Fractions Treated to Date: 3
Plan Fractions Treated to Date: 5
Plan Prescribed Dose Per Fraction: 2 Gy
Plan Prescribed Dose Per Fraction: 2 Gy
Plan Total Fractions Prescribed: 13
Plan Total Fractions Prescribed: 25
Plan Total Prescribed Dose: 26 Gy
Plan Total Prescribed Dose: 50 Gy
Reference Point Dosage Given to Date: 10 Gy
Reference Point Dosage Given to Date: 10 Gy
Reference Point Session Dosage Given: 2 Gy
Reference Point Session Dosage Given: 2 Gy
Session Number: 5

## 2021-10-06 ENCOUNTER — Ambulatory Visit: Payer: Commercial Managed Care - HMO

## 2021-10-07 ENCOUNTER — Other Ambulatory Visit (HOSPITAL_COMMUNITY): Payer: Self-pay

## 2021-10-07 ENCOUNTER — Other Ambulatory Visit: Payer: Self-pay

## 2021-10-07 ENCOUNTER — Encounter: Payer: Self-pay | Admitting: Hematology and Oncology

## 2021-10-07 ENCOUNTER — Ambulatory Visit
Admission: RE | Admit: 2021-10-07 | Discharge: 2021-10-07 | Disposition: A | Discharge status: Home / Self Care | Payer: Commercial Managed Care - HMO | Source: Ambulatory Visit | Attending: Radiation Oncology | Admitting: Radiation Oncology

## 2021-10-07 DIAGNOSIS — Z510 Encounter for antineoplastic radiation therapy: Secondary | ICD-10-CM | POA: Diagnosis not present

## 2021-10-07 DIAGNOSIS — Z79899 Other long term (current) drug therapy: Secondary | ICD-10-CM | POA: Diagnosis not present

## 2021-10-07 DIAGNOSIS — C50412 Malignant neoplasm of upper-outer quadrant of left female breast: Secondary | ICD-10-CM | POA: Diagnosis not present

## 2021-10-07 DIAGNOSIS — Z5112 Encounter for antineoplastic immunotherapy: Secondary | ICD-10-CM | POA: Diagnosis not present

## 2021-10-07 LAB — RAD ONC ARIA SESSION SUMMARY
Course Elapsed Days: 10
Plan Fractions Treated to Date: 3
Plan Fractions Treated to Date: 6
Plan Prescribed Dose Per Fraction: 2 Gy
Plan Prescribed Dose Per Fraction: 2 Gy
Plan Total Fractions Prescribed: 12
Plan Total Fractions Prescribed: 25
Plan Total Prescribed Dose: 24 Gy
Plan Total Prescribed Dose: 50 Gy
Reference Point Dosage Given to Date: 12 Gy
Reference Point Dosage Given to Date: 12 Gy
Reference Point Session Dosage Given: 2 Gy
Reference Point Session Dosage Given: 2 Gy
Session Number: 6

## 2021-10-10 ENCOUNTER — Inpatient Hospital Stay (HOSPITAL_COMMUNITY): Admission: RE | Admit: 2021-10-10 | Payer: Medicaid Other | Source: Ambulatory Visit

## 2021-10-10 ENCOUNTER — Inpatient Hospital Stay: Payer: Commercial Managed Care - HMO | Admitting: Licensed Clinical Social Worker

## 2021-10-10 ENCOUNTER — Other Ambulatory Visit: Payer: Self-pay

## 2021-10-10 ENCOUNTER — Ambulatory Visit
Admission: RE | Admit: 2021-10-10 | Discharge: 2021-10-10 | Disposition: A | Discharge status: Home / Self Care | Payer: Commercial Managed Care - HMO | Source: Ambulatory Visit | Attending: Radiation Oncology | Admitting: Radiation Oncology

## 2021-10-10 DIAGNOSIS — Z79899 Other long term (current) drug therapy: Secondary | ICD-10-CM | POA: Diagnosis not present

## 2021-10-10 DIAGNOSIS — Z5112 Encounter for antineoplastic immunotherapy: Secondary | ICD-10-CM | POA: Diagnosis not present

## 2021-10-10 DIAGNOSIS — Z510 Encounter for antineoplastic radiation therapy: Secondary | ICD-10-CM | POA: Diagnosis not present

## 2021-10-10 DIAGNOSIS — C50412 Malignant neoplasm of upper-outer quadrant of left female breast: Secondary | ICD-10-CM | POA: Diagnosis not present

## 2021-10-10 LAB — RAD ONC ARIA SESSION SUMMARY
Course Elapsed Days: 13
Plan Fractions Treated to Date: 4
Plan Fractions Treated to Date: 7
Plan Prescribed Dose Per Fraction: 2 Gy
Plan Prescribed Dose Per Fraction: 2 Gy
Plan Total Fractions Prescribed: 13
Plan Total Fractions Prescribed: 25
Plan Total Prescribed Dose: 26 Gy
Plan Total Prescribed Dose: 50 Gy
Reference Point Dosage Given to Date: 14 Gy
Reference Point Dosage Given to Date: 14 Gy
Reference Point Session Dosage Given: 2 Gy
Reference Point Session Dosage Given: 2 Gy
Session Number: 7

## 2021-10-10 NOTE — Progress Notes (Signed)
[facility] CSW Progress Note  Clinical Education officer, museum contacted patient by phone to follow-up on resources.  Pt was able to speak with [place] studies program and they are working on funds to help with 1st month rent and/or deposit.  Pt also spoke with apartment complex and the apartment should be ready in the next 1-2 weeks.  Pt did state that she has not recertified her food stamps yet (is working on this) and needs help with food. CSW providing bag today as well as a list of local food pantries.    Robin Horn E Robin Mcdevitt, LCSW    Patient is participating in a Managed Medicaid Plan:  Yes

## 2021-10-11 ENCOUNTER — Other Ambulatory Visit: Payer: Self-pay

## 2021-10-11 ENCOUNTER — Ambulatory Visit: Payer: Commercial Managed Care - HMO

## 2021-10-11 ENCOUNTER — Ambulatory Visit
Admission: RE | Admit: 2021-10-11 | Discharge: 2021-10-11 | Disposition: A | Discharge status: Home / Self Care | Payer: Commercial Managed Care - HMO | Source: Ambulatory Visit | Attending: Radiation Oncology | Admitting: Radiation Oncology

## 2021-10-11 DIAGNOSIS — C50412 Malignant neoplasm of upper-outer quadrant of left female breast: Secondary | ICD-10-CM | POA: Diagnosis not present

## 2021-10-11 DIAGNOSIS — Z510 Encounter for antineoplastic radiation therapy: Secondary | ICD-10-CM | POA: Diagnosis not present

## 2021-10-11 DIAGNOSIS — Z79899 Other long term (current) drug therapy: Secondary | ICD-10-CM | POA: Diagnosis not present

## 2021-10-11 DIAGNOSIS — Z5112 Encounter for antineoplastic immunotherapy: Secondary | ICD-10-CM | POA: Diagnosis not present

## 2021-10-11 LAB — RAD ONC ARIA SESSION SUMMARY
Course Elapsed Days: 14
Plan Fractions Treated to Date: 4
Plan Fractions Treated to Date: 8
Plan Prescribed Dose Per Fraction: 2 Gy
Plan Prescribed Dose Per Fraction: 2 Gy
Plan Total Fractions Prescribed: 12
Plan Total Fractions Prescribed: 25
Plan Total Prescribed Dose: 24 Gy
Plan Total Prescribed Dose: 50 Gy
Reference Point Dosage Given to Date: 16 Gy
Reference Point Dosage Given to Date: 16 Gy
Reference Point Session Dosage Given: 2 Gy
Reference Point Session Dosage Given: 2 Gy
Session Number: 8

## 2021-10-12 ENCOUNTER — Other Ambulatory Visit (HOSPITAL_COMMUNITY)
Admission: RE | Admit: 2021-10-12 | Discharge: 2021-10-12 | Disposition: A | Discharge status: Home / Self Care | Payer: Commercial Managed Care - HMO | Source: Ambulatory Visit | Attending: Internal Medicine | Admitting: Internal Medicine

## 2021-10-12 ENCOUNTER — Other Ambulatory Visit: Payer: Self-pay | Admitting: Hematology and Oncology

## 2021-10-12 ENCOUNTER — Other Ambulatory Visit: Payer: Self-pay

## 2021-10-12 ENCOUNTER — Encounter: Payer: Self-pay | Admitting: Student

## 2021-10-12 ENCOUNTER — Ambulatory Visit (INDEPENDENT_AMBULATORY_CARE_PROVIDER_SITE_OTHER): Payer: Commercial Managed Care - HMO | Admitting: Student

## 2021-10-12 ENCOUNTER — Ambulatory Visit
Admission: RE | Admit: 2021-10-12 | Discharge: 2021-10-12 | Disposition: A | Discharge status: Home / Self Care | Payer: Commercial Managed Care - HMO | Source: Ambulatory Visit | Attending: Radiation Oncology | Admitting: Radiation Oncology

## 2021-10-12 ENCOUNTER — Other Ambulatory Visit (HOSPITAL_COMMUNITY): Payer: Self-pay

## 2021-10-12 VITALS — BP 140/99 | HR 114 | Temp 98.5°F | Resp 24 | Ht 62.0 in | Wt 152.2 lb

## 2021-10-12 DIAGNOSIS — C50412 Malignant neoplasm of upper-outer quadrant of left female breast: Secondary | ICD-10-CM | POA: Diagnosis not present

## 2021-10-12 DIAGNOSIS — M62838 Other muscle spasm: Secondary | ICD-10-CM

## 2021-10-12 DIAGNOSIS — Z510 Encounter for antineoplastic radiation therapy: Secondary | ICD-10-CM | POA: Diagnosis not present

## 2021-10-12 DIAGNOSIS — Z79899 Other long term (current) drug therapy: Secondary | ICD-10-CM | POA: Diagnosis not present

## 2021-10-12 DIAGNOSIS — I10 Essential (primary) hypertension: Secondary | ICD-10-CM | POA: Diagnosis not present

## 2021-10-12 DIAGNOSIS — Z01419 Encounter for gynecological examination (general) (routine) without abnormal findings: Secondary | ICD-10-CM | POA: Insufficient documentation

## 2021-10-12 DIAGNOSIS — Z59819 Housing instability, housed unspecified: Secondary | ICD-10-CM

## 2021-10-12 DIAGNOSIS — Z5112 Encounter for antineoplastic immunotherapy: Secondary | ICD-10-CM | POA: Diagnosis not present

## 2021-10-12 LAB — RAD ONC ARIA SESSION SUMMARY
Course Elapsed Days: 15
Plan Fractions Treated to Date: 5
Plan Fractions Treated to Date: 9
Plan Prescribed Dose Per Fraction: 2 Gy
Plan Prescribed Dose Per Fraction: 2 Gy
Plan Total Fractions Prescribed: 13
Plan Total Fractions Prescribed: 25
Plan Total Prescribed Dose: 26 Gy
Plan Total Prescribed Dose: 50 Gy
Reference Point Dosage Given to Date: 18 Gy
Reference Point Dosage Given to Date: 18 Gy
Reference Point Session Dosage Given: 2 Gy
Reference Point Session Dosage Given: 2 Gy
Session Number: 9

## 2021-10-12 MED ORDER — METHOCARBAMOL 750 MG PO TABS: 750.0000 mg | ORAL_TABLET | Freq: Four times a day (QID) | ORAL | 0 refills | Status: DC

## 2021-10-12 MED ORDER — LISINOPRIL 10 MG PO TABS
10.0000 mg | ORAL_TABLET | Freq: Every day | ORAL | 5 refills | Status: DC
Start: 2021-10-12 — End: 2021-10-19

## 2021-10-12 NOTE — Assessment & Plan Note (Signed)
Patient with several negative SDoH, including recent housing instability. She is currently undergoing chemo/radiation, taking care of her children, and has been living intermittently in her car. She is currently awaiting for decision on disability, but states she was told it would likely be denied. She was advised by her heme/onc to stay out of work to complete her treatment but has been unable to make any income to care for family. It has gotten to the point where she is deciding to move elsewhere for better quality of life. I discussed that we would love to continue her care but if she decides to move elsewhere, to inform [facility] and heme/onc physician so that appropriate transfer of care/records can take place. Will send referral to [facility] to help with housing instability for patient in the meantime.  Plan: -referral to [facility]

## 2021-10-12 NOTE — Progress Notes (Signed)
CC: f/u HTN, PAP smear  HPI:  [name] is a 36 y.o. female with history listed below presenting to the [facility] for f/u HTN, PAP smear. Please see individualized problem based charting for full HPI.  Past Medical History:  Diagnosis Date   Abnormal Pap smear of cervix    Anemia    Anxiety    Arthritis    Bipolar disorder ([place])    diagnosed years ago   Cancer [facility])    Chlamydia    Depression    Drug use affecting pregnancy, antepartum 05/25/2016   History of cocaine use   Encounter for dental examination 04/12/2021   Family history of breast cancer 01/18/2021   GDM (gestational diabetes mellitus)    GERD (gastroesophageal reflux disease)    pt used to take omeprazole, doesn't have much issues with it anymore   Gonorrhea    Herpes genitalia    History of hiatal hernia    History of postpartum hemorrhage, currently pregnant 05/25/2016   Migraines    Ovarian cyst    Polycystic kidney disease    Positive GBS test 09/23/2016   Preeclampsia    Pregnancy affected by fetal growth restriction 09/20/2016   Baby B. Getting weekly dopplers, AFI.    Pregnancy induced hypertension     Review of Systems:  Negative aside from that listed in individualized problem based charting.  Physical Exam:  Vitals:   10/12/21 0916  BP: (!) 140/99  Pulse: (!) 114  Resp: (!) 24  Temp: 98.5 F (36.9 C)  TempSrc: Oral  SpO2: 100%  Weight: 152 lb 3.2 oz (69 kg)  Height: '5\' 2"'$  (1.575 m)   Physical Exam Constitutional:      Appearance: Normal appearance. She is not ill-appearing.  HENT:     Nose: Nose normal.     Mouth/Throat:     Mouth: Mucous membranes are moist.     Pharynx: Oropharynx is clear.  Eyes:     Extraocular Movements: Extraocular movements intact.     Conjunctiva/sclera: Conjunctivae normal.     Pupils: Pupils are equal, round, and reactive to light.  Cardiovascular:     Rate and Rhythm: Regular rhythm. Tachycardia present.     Pulses: Normal pulses.     Heart  sounds: Normal heart sounds. No murmur heard.    No gallop.  Pulmonary:     Effort: Pulmonary effort is normal.     Breath sounds: Normal breath sounds. No wheezing, rhonchi or rales.  Chest:  Breasts:    Right: Absent.     Left: Absent.     Comments: S/p bilateral mastectomy Abdominal:     General: Bowel sounds are normal. There is no distension.     Palpations: Abdomen is soft.     Tenderness: There is no abdominal tenderness.  Genitourinary:    Comments: No lesions on external genitalia. Rugated vagina, white discharge, no odor. Patent cerivcal os without discharge or lesions. Small, mobile, midline, anteverted, non-tender uterus. PAP smear performed. Musculoskeletal:        General: Normal range of motion.  Skin:    General: Skin is warm and dry.  Neurological:     General: No focal deficit present.     Mental Status: She is alert and oriented to person, place, and time.  Psychiatric:        Mood and Affect: Mood normal.        Behavior: Behavior normal.      Assessment & Plan:  See Encounters Tab for problem based charting.  Patient discussed with Dr. Heber Gunnison

## 2021-10-12 NOTE — Patient Instructions (Signed)
Ms. Mcgrory,  It was a pleasure seeing you in the clinic today.   I have refilled your lisinopril today. I prescribed a 30-day course of Robaxin (muscle relaxant) to help with your back spasms. You received your PAP smear today. Please come back in 1 month for follow up visit.  Please call our clinic at [phone] if you have any questions or concerns. The best time to call is Monday-Friday from 9am-4pm, but there is someone available 24/7 at the same number. If you need medication refills, please notify your pharmacy one week in advance and they will send [place] a request.   Thank you for letting us take part in your care. We look forward to seeing you next time!

## 2021-10-12 NOTE — Assessment & Plan Note (Signed)
Performed PAP smear today with co-testing for cervical cancer screening.   Plan: -f/u PAP results

## 2021-10-12 NOTE — Assessment & Plan Note (Signed)
Today's Vitals   10/12/21 0916 10/12/21 0919  BP: (!) 140/99   Pulse: (!) 114   Resp: (!) 24   Temp: 98.5 F (36.9 C)   TempSrc: Oral   SpO2: 100%   Weight: 152 lb 3.2 oz (69 kg)   Height: '5\' 2"'$  (1.575 m)   PainSc:  5    Body mass index is 27.84 kg/m.   Patient with history of HTN, previously relatively well controlled with lisinopril '10mg'$  daily. BP is elevated today, but she endorses that she has been out of her lisinopril for over a week now. Will restart at same dose and reevaluate BP at next visit. Can increase dose of lisinopril if needed for better BP control.  Plan: -lisinopril '10mg'$  daily -recheck BP at next visit

## 2021-10-12 NOTE — Assessment & Plan Note (Signed)
Patient states that she has a history of muscle spasms in lower back. States she was told that she has scoliosis in the past, but no record of this on chart review. Patient is currently undergoing chemo and radiation for treatment of breast cancer and states that she has been experiencing more spasticity since initiation of this. She was tried on tizanidine in the past with good relief, states that baclofen did not work for her. We discussed trial of robaxin for 30 days to assess response. Can adjust medication and/or switch to different muscle relaxant if needed at that time.   Plan: -robaxin '750mg'$  qid prn x30 days -f/u in 1 month

## 2021-10-13 ENCOUNTER — Ambulatory Visit: Payer: Medicaid Other

## 2021-10-13 ENCOUNTER — Ambulatory Visit: Payer: Medicaid Other | Admitting: Adult Health

## 2021-10-13 ENCOUNTER — Other Ambulatory Visit: Payer: Self-pay

## 2021-10-13 ENCOUNTER — Ambulatory Visit
Admission: RE | Admit: 2021-10-13 | Discharge: 2021-10-13 | Disposition: A | Discharge status: Home / Self Care | Payer: Commercial Managed Care - HMO | Source: Ambulatory Visit | Attending: Radiation Oncology | Admitting: Radiation Oncology

## 2021-10-13 ENCOUNTER — Other Ambulatory Visit: Payer: Medicaid Other

## 2021-10-13 DIAGNOSIS — C50412 Malignant neoplasm of upper-outer quadrant of left female breast: Secondary | ICD-10-CM | POA: Diagnosis not present

## 2021-10-13 DIAGNOSIS — Z5112 Encounter for antineoplastic immunotherapy: Secondary | ICD-10-CM | POA: Diagnosis not present

## 2021-10-13 DIAGNOSIS — Z510 Encounter for antineoplastic radiation therapy: Secondary | ICD-10-CM | POA: Diagnosis not present

## 2021-10-13 DIAGNOSIS — Z79899 Other long term (current) drug therapy: Secondary | ICD-10-CM | POA: Diagnosis not present

## 2021-10-13 LAB — RAD ONC ARIA SESSION SUMMARY
Course Elapsed Days: 16
Plan Fractions Treated to Date: 10
Plan Fractions Treated to Date: 5
Plan Prescribed Dose Per Fraction: 2 Gy
Plan Prescribed Dose Per Fraction: 2 Gy
Plan Total Fractions Prescribed: 12
Plan Total Fractions Prescribed: 25
Plan Total Prescribed Dose: 24 Gy
Plan Total Prescribed Dose: 50 Gy
Reference Point Dosage Given to Date: 20 Gy
Reference Point Dosage Given to Date: 20 Gy
Reference Point Session Dosage Given: 2 Gy
Reference Point Session Dosage Given: 2 Gy
Session Number: 10

## 2021-10-13 LAB — CYTOLOGY - PAP
Adequacy: ABSENT
Comment: NEGATIVE
Diagnosis: NEGATIVE
High risk HPV: NEGATIVE

## 2021-10-14 ENCOUNTER — Other Ambulatory Visit: Payer: Self-pay

## 2021-10-14 ENCOUNTER — Other Ambulatory Visit (HOSPITAL_COMMUNITY): Payer: Self-pay

## 2021-10-14 ENCOUNTER — Ambulatory Visit
Admission: RE | Admit: 2021-10-14 | Discharge: 2021-10-14 | Disposition: A | Discharge status: Home / Self Care | Payer: Commercial Managed Care - HMO | Source: Ambulatory Visit | Attending: Radiation Oncology | Admitting: Radiation Oncology

## 2021-10-14 DIAGNOSIS — Z510 Encounter for antineoplastic radiation therapy: Secondary | ICD-10-CM | POA: Diagnosis not present

## 2021-10-14 DIAGNOSIS — C50412 Malignant neoplasm of upper-outer quadrant of left female breast: Secondary | ICD-10-CM | POA: Diagnosis not present

## 2021-10-14 DIAGNOSIS — Z79899 Other long term (current) drug therapy: Secondary | ICD-10-CM | POA: Diagnosis not present

## 2021-10-14 DIAGNOSIS — Z5112 Encounter for antineoplastic immunotherapy: Secondary | ICD-10-CM | POA: Diagnosis not present

## 2021-10-14 LAB — RAD ONC ARIA SESSION SUMMARY
Course Elapsed Days: 17
Plan Fractions Treated to Date: 11
Plan Fractions Treated to Date: 6
Plan Prescribed Dose Per Fraction: 2 Gy
Plan Prescribed Dose Per Fraction: 2 Gy
Plan Total Fractions Prescribed: 13
Plan Total Fractions Prescribed: 25
Plan Total Prescribed Dose: 26 Gy
Plan Total Prescribed Dose: 50 Gy
Reference Point Dosage Given to Date: 22 Gy
Reference Point Dosage Given to Date: 22 Gy
Reference Point Session Dosage Given: 2 Gy
Reference Point Session Dosage Given: 2 Gy
Session Number: 11

## 2021-10-14 NOTE — Progress Notes (Signed)
Internal Medicine Clinic Attending  Case discussed with the resident at the time of the visit.  We reviewed the resident's history and exam and pertinent patient test results.  I agree with the assessment, diagnosis, and plan of care documented in the resident's note.  

## 2021-10-17 ENCOUNTER — Encounter: Payer: Self-pay | Admitting: Hematology and Oncology

## 2021-10-17 ENCOUNTER — Other Ambulatory Visit (HOSPITAL_COMMUNITY): Payer: Self-pay

## 2021-10-17 ENCOUNTER — Other Ambulatory Visit: Payer: Self-pay

## 2021-10-17 ENCOUNTER — Ambulatory Visit
Admission: RE | Admit: 2021-10-17 | Discharge: 2021-10-17 | Disposition: A | Discharge status: Home / Self Care | Payer: Commercial Managed Care - HMO | Source: Ambulatory Visit | Attending: Radiation Oncology | Admitting: Radiation Oncology

## 2021-10-17 DIAGNOSIS — Z5112 Encounter for antineoplastic immunotherapy: Secondary | ICD-10-CM | POA: Diagnosis not present

## 2021-10-17 DIAGNOSIS — Z79899 Other long term (current) drug therapy: Secondary | ICD-10-CM | POA: Diagnosis not present

## 2021-10-17 DIAGNOSIS — Z510 Encounter for antineoplastic radiation therapy: Secondary | ICD-10-CM | POA: Diagnosis not present

## 2021-10-17 DIAGNOSIS — C50412 Malignant neoplasm of upper-outer quadrant of left female breast: Secondary | ICD-10-CM | POA: Diagnosis not present

## 2021-10-17 LAB — RAD ONC ARIA SESSION SUMMARY
Course Elapsed Days: 20
Plan Fractions Treated to Date: 12
Plan Fractions Treated to Date: 6
Plan Prescribed Dose Per Fraction: 2 Gy
Plan Prescribed Dose Per Fraction: 2 Gy
Plan Total Fractions Prescribed: 12
Plan Total Fractions Prescribed: 25
Plan Total Prescribed Dose: 24 Gy
Plan Total Prescribed Dose: 50 Gy
Reference Point Dosage Given to Date: 24 Gy
Reference Point Dosage Given to Date: 24 Gy
Reference Point Session Dosage Given: 2 Gy
Reference Point Session Dosage Given: 2 Gy
Session Number: 12

## 2021-10-18 ENCOUNTER — Ambulatory Visit: Payer: Commercial Managed Care - HMO

## 2021-10-18 ENCOUNTER — Ambulatory Visit: Payer: Commercial Managed Care - HMO | Admitting: Radiation Oncology

## 2021-10-19 ENCOUNTER — Ambulatory Visit
Admission: RE | Admit: 2021-10-19 | Discharge: 2021-10-19 | Disposition: A | Discharge status: Home / Self Care | Payer: Commercial Managed Care - HMO | Source: Ambulatory Visit | Attending: Radiation Oncology | Admitting: Radiation Oncology

## 2021-10-19 ENCOUNTER — Ambulatory Visit: Payer: Commercial Managed Care - HMO | Admitting: Radiation Oncology

## 2021-10-19 ENCOUNTER — Other Ambulatory Visit: Payer: Self-pay | Admitting: Radiation Oncology

## 2021-10-19 ENCOUNTER — Other Ambulatory Visit: Payer: Self-pay

## 2021-10-19 ENCOUNTER — Other Ambulatory Visit (HOSPITAL_COMMUNITY): Payer: Self-pay

## 2021-10-19 ENCOUNTER — Ambulatory Visit: Payer: Commercial Managed Care - HMO

## 2021-10-19 DIAGNOSIS — Z510 Encounter for antineoplastic radiation therapy: Secondary | ICD-10-CM | POA: Diagnosis not present

## 2021-10-19 DIAGNOSIS — Z79899 Other long term (current) drug therapy: Secondary | ICD-10-CM | POA: Diagnosis not present

## 2021-10-19 DIAGNOSIS — C50412 Malignant neoplasm of upper-outer quadrant of left female breast: Secondary | ICD-10-CM | POA: Diagnosis not present

## 2021-10-19 DIAGNOSIS — Z5112 Encounter for antineoplastic immunotherapy: Secondary | ICD-10-CM | POA: Diagnosis not present

## 2021-10-19 LAB — RAD ONC ARIA SESSION SUMMARY
Course Elapsed Days: 22
Plan Fractions Treated to Date: 13
Plan Fractions Treated to Date: 7
Plan Prescribed Dose Per Fraction: 2 Gy
Plan Prescribed Dose Per Fraction: 2 Gy
Plan Total Fractions Prescribed: 13
Plan Total Fractions Prescribed: 25
Plan Total Prescribed Dose: 26 Gy
Plan Total Prescribed Dose: 50 Gy
Reference Point Dosage Given to Date: 26 Gy
Reference Point Dosage Given to Date: 26 Gy
Reference Point Session Dosage Given: 2 Gy
Reference Point Session Dosage Given: 2 Gy
Session Number: 13

## 2021-10-19 MED ORDER — LISINOPRIL 10 MG PO TABS
10.0000 mg | ORAL_TABLET | Freq: Every day | ORAL | 5 refills | Status: DC
  Filled 2021-10-19: qty 30, 30d supply, fill #0
  Filled 2021-11-28 (×2): qty 30, 30d supply, fill #1

## 2021-10-19 MED ORDER — PROCHLORPERAZINE MALEATE 10 MG PO TABS
10.0000 mg | ORAL_TABLET | Freq: Four times a day (QID) | ORAL | 0 refills | Status: DC | PRN
  Filled 2021-10-19: qty 20, 5d supply, fill #0

## 2021-10-20 ENCOUNTER — Ambulatory Visit: Payer: Commercial Managed Care - HMO

## 2021-10-21 ENCOUNTER — Inpatient Hospital Stay: Payer: Commercial Managed Care - HMO

## 2021-10-21 ENCOUNTER — Inpatient Hospital Stay: Payer: Commercial Managed Care - HMO | Admitting: Physician Assistant

## 2021-10-21 ENCOUNTER — Ambulatory Visit: Payer: Commercial Managed Care - HMO

## 2021-10-24 ENCOUNTER — Telehealth: Payer: Self-pay | Admitting: Hematology and Oncology

## 2021-10-24 ENCOUNTER — Ambulatory Visit (HOSPITAL_BASED_OUTPATIENT_CLINIC_OR_DEPARTMENT_OTHER)
Admission: RE | Admit: 2021-10-24 | Discharge: 2021-10-24 | Disposition: A | Discharge status: Home / Self Care | Payer: Commercial Managed Care - HMO | Source: Ambulatory Visit | Attending: Hematology and Oncology | Admitting: Hematology and Oncology

## 2021-10-24 ENCOUNTER — Other Ambulatory Visit: Payer: Self-pay

## 2021-10-24 ENCOUNTER — Ambulatory Visit
Admission: RE | Admit: 2021-10-24 | Discharge: 2021-10-24 | Disposition: A | Discharge status: Home / Self Care | Payer: Commercial Managed Care - HMO | Source: Ambulatory Visit | Attending: Radiation Oncology | Admitting: Radiation Oncology

## 2021-10-24 DIAGNOSIS — Z0189 Encounter for other specified special examinations: Secondary | ICD-10-CM | POA: Diagnosis not present

## 2021-10-24 DIAGNOSIS — C50412 Malignant neoplasm of upper-outer quadrant of left female breast: Secondary | ICD-10-CM | POA: Insufficient documentation

## 2021-10-24 DIAGNOSIS — R892 Abnormal level of other drugs, medicaments and biological substances in specimens from other organs, systems and tissues: Secondary | ICD-10-CM

## 2021-10-24 DIAGNOSIS — Z79899 Other long term (current) drug therapy: Secondary | ICD-10-CM | POA: Diagnosis not present

## 2021-10-24 DIAGNOSIS — Z510 Encounter for antineoplastic radiation therapy: Secondary | ICD-10-CM | POA: Diagnosis not present

## 2021-10-24 DIAGNOSIS — Z5112 Encounter for antineoplastic immunotherapy: Secondary | ICD-10-CM | POA: Diagnosis not present

## 2021-10-24 DIAGNOSIS — Z170 Estrogen receptor positive status [ER+]: Secondary | ICD-10-CM | POA: Insufficient documentation

## 2021-10-24 LAB — RAD ONC ARIA SESSION SUMMARY
Course Elapsed Days: 27
Plan Fractions Treated to Date: 14
Plan Fractions Treated to Date: 7
Plan Prescribed Dose Per Fraction: 2 Gy
Plan Prescribed Dose Per Fraction: 2 Gy
Plan Total Fractions Prescribed: 12
Plan Total Fractions Prescribed: 25
Plan Total Prescribed Dose: 24 Gy
Plan Total Prescribed Dose: 50 Gy
Reference Point Dosage Given to Date: 28 Gy
Reference Point Dosage Given to Date: 28 Gy
Reference Point Session Dosage Given: 2 Gy
Reference Point Session Dosage Given: 2 Gy
Session Number: 14

## 2021-10-24 LAB — ECHOCARDIOGRAM COMPLETE
Area-P 1/2: 3.27 cm2
Calc EF: 55.7 %
S' Lateral: 3 cm
Single Plane A2C EF: 57.9 %
Single Plane A4C EF: 54.1 %

## 2021-10-25 ENCOUNTER — Ambulatory Visit: Payer: Commercial Managed Care - HMO

## 2021-10-25 ENCOUNTER — Ambulatory Visit: Payer: Commercial Managed Care - HMO | Admitting: Radiation Oncology

## 2021-10-25 ENCOUNTER — Other Ambulatory Visit: Payer: Self-pay

## 2021-10-25 ENCOUNTER — Ambulatory Visit
Admission: RE | Admit: 2021-10-25 | Discharge: 2021-10-25 | Disposition: A | Discharge status: Home / Self Care | Payer: Commercial Managed Care - HMO | Source: Ambulatory Visit | Attending: Radiation Oncology | Admitting: Radiation Oncology

## 2021-10-25 DIAGNOSIS — Z79899 Other long term (current) drug therapy: Secondary | ICD-10-CM | POA: Diagnosis not present

## 2021-10-25 DIAGNOSIS — Z510 Encounter for antineoplastic radiation therapy: Secondary | ICD-10-CM | POA: Diagnosis not present

## 2021-10-25 DIAGNOSIS — C50412 Malignant neoplasm of upper-outer quadrant of left female breast: Secondary | ICD-10-CM | POA: Diagnosis not present

## 2021-10-25 DIAGNOSIS — Z5112 Encounter for antineoplastic immunotherapy: Secondary | ICD-10-CM | POA: Diagnosis not present

## 2021-10-25 LAB — RAD ONC ARIA SESSION SUMMARY
Course Elapsed Days: 28
Plan Fractions Treated to Date: 15
Plan Fractions Treated to Date: 8
Plan Prescribed Dose Per Fraction: 2 Gy
Plan Prescribed Dose Per Fraction: 2 Gy
Plan Total Fractions Prescribed: 13
Plan Total Fractions Prescribed: 25
Plan Total Prescribed Dose: 26 Gy
Plan Total Prescribed Dose: 50 Gy
Reference Point Dosage Given to Date: 30 Gy
Reference Point Dosage Given to Date: 30 Gy
Reference Point Session Dosage Given: 2 Gy
Reference Point Session Dosage Given: 2 Gy
Session Number: 15

## 2021-10-26 ENCOUNTER — Ambulatory Visit: Payer: Commercial Managed Care - HMO

## 2021-10-26 ENCOUNTER — Other Ambulatory Visit: Payer: Self-pay

## 2021-10-26 ENCOUNTER — Ambulatory Visit
Admission: RE | Admit: 2021-10-26 | Discharge: 2021-10-26 | Disposition: A | Discharge status: Home / Self Care | Payer: Commercial Managed Care - HMO | Source: Ambulatory Visit | Attending: Radiation Oncology | Admitting: Radiation Oncology

## 2021-10-26 DIAGNOSIS — Z79899 Other long term (current) drug therapy: Secondary | ICD-10-CM | POA: Diagnosis not present

## 2021-10-26 DIAGNOSIS — Z510 Encounter for antineoplastic radiation therapy: Secondary | ICD-10-CM | POA: Diagnosis not present

## 2021-10-26 DIAGNOSIS — C50412 Malignant neoplasm of upper-outer quadrant of left female breast: Secondary | ICD-10-CM | POA: Diagnosis not present

## 2021-10-26 DIAGNOSIS — Z5112 Encounter for antineoplastic immunotherapy: Secondary | ICD-10-CM | POA: Diagnosis not present

## 2021-10-26 LAB — RAD ONC ARIA SESSION SUMMARY
Course Elapsed Days: 29
Plan Fractions Treated to Date: 16
Plan Fractions Treated to Date: 8
Plan Prescribed Dose Per Fraction: 2 Gy
Plan Prescribed Dose Per Fraction: 2 Gy
Plan Total Fractions Prescribed: 12
Plan Total Fractions Prescribed: 25
Plan Total Prescribed Dose: 24 Gy
Plan Total Prescribed Dose: 50 Gy
Reference Point Dosage Given to Date: 32 Gy
Reference Point Dosage Given to Date: 32 Gy
Reference Point Session Dosage Given: 2 Gy
Reference Point Session Dosage Given: 2 Gy
Session Number: 16

## 2021-10-26 NOTE — Patient Instructions (Signed)
Visit Information  Robin Horn was given information about Medicaid Managed Care team care coordination services as a part of their [place] Medicaid benefit. Robin Horn verbally consented to engagement with the [facility] Managed Care team.   If you are experiencing a medical emergency, please call 911 or report to your local emergency department or urgent care.   If you have a non-emergency medical problem during routine business hours, please contact your provider's office and ask to speak with a nurse.   For questions related to your [facility] health plan, please call: [phone] or go here:[email or web address]  If you would like to schedule transportation through your [facility] plan, please call the following number at least 2 days in advance of your appointment: [phone].  You can also use the MTM portal or MTM mobile app to manage your rides. For the portal, please go to [email or web address].  Call the [facility] at [phone], at any time, 24 hours a day, 7 days a week. If you are in danger or need immediate medical attention call 911.  If you would like help to quit smoking, call 1-800-QUIT-NOW (667)114-1765) OR Espaol: 1-855-Djelo-Ya (4-431-540-0867) o para ms informacin haga clic aqu or Text READY to 200-400 to register via text  Robin Horn - following are the goals we discussed in your visit today:   Goals Addressed   None       Social Worker will follow up in 14 days .   Mickel Fuchs, BSW, [place] Managed Medicaid Team  [phone]   Following is a copy of your plan of care:  There are no care plans that you recently modified to display for this patient.

## 2021-10-26 NOTE — Patient Outreach (Signed)
Medicaid Managed Care Social Work Note  10/26/2021 Name:  Robin Horn MRN:  [id number] DOB:  1985/08/31  Robin Horn is an 36 y.o. year old female who is a primary patient of [name], DO.  The Medicaid Managed Care Coordination team was consulted for assistance with:  Community Resources   Ms. Dupuis was given information about Medicaid Managed Care Coordination team services today. Blaine Hamper Patient agreed to services and verbal consent obtained.  Engaged with patient  for by telephone forinitial visit in response to referral for case management and/or care coordination services.   Assessments/Interventions:  Review of past medical history, allergies, medications, health status, including review of consultants reports, laboratory and other test data, was performed as part of comprehensive evaluation and provision of chronic care management services.  SDOH: (Social Determinant of Health) assessments and interventions performed: BSW completed telephone outreach with patient. She stated she is currently living with her mom, however her mom's landlord only allows 2 people in the home and she is staying there with her 3 children. Patient is looking for housing in [facility] area and does not have any income. Disability is currently pending. Patient states her children's school sent her a list of grants that she qualifies for but needs a Education officer, museum to complete them. BSW informed patient could send those to her. BSW will send a list of income driven housing resources to patients email at [email or web address]  Advanced Directives Status:  Not addressed in this encounter.  Care Plan                 Allergies  Allergen Reactions   Hydrocodone-Acetaminophen Hives   Tramadol Itching   Lactose Intolerance (Gi) Diarrhea    Medications Reviewed Today     Reviewed by Gery Pray, MD (Physician) on 10/25/21 at 1338  Med List Status: <None>   Medication Order Taking? Sig  Documenting Provider Last Dose Status Informant  acetaminophen (TYLENOL) 500 MG tablet [id number]  Take 1,000 mg by mouth every 6 (six) hours as needed (pain). [provider]  Active Self  lidocaine (XYLOCAINE) 2 % solution [id number]  Use as directed 15 mLs in the mouth or throat every 3 (three) hours as needed for mouth pain. Lewanda Rife  Active Pharmacy Records  lidocaine-prilocaine (EMLA) cream [id number]  Apply 1 application. topically as needed. [name], MD  Active   lisinopril (ZESTRIL) 10 MG tablet [id number]  Take 1 tablet (10 mg total) by mouth daily. Gery Pray, MD  Active   methocarbamol (ROBAXIN) 750 MG tablet [id number]  Take 1 tablet (750 mg total) by mouth 4 (four) times daily. Virl Axe, MD  Active   prochlorperazine (COMPAZINE) 10 MG tablet [id number]  Take 1 tablet (10 mg total) by mouth every 6 (six) hours as needed for nausea or vomiting. 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   Genetic testing  02/03/2021   Family history of breast cancer 01/18/2021   Port-A-Cath in place 01/18/2021   Malignant neoplasm of upper-outer quadrant of left breast in female, estrogen receptor positive ([place]) 12/23/2020   Urinary problem 11/15/2020   Hypertension 11/15/2020   Encounter for female sterilization procedure 09/24/2016   Supervision of other high risk pregnancy, antepartum 09/20/2016   GDM (gestational diabetes mellitus)  09/11/2016   Gestational hypertension without significant proteinuria 08/29/2016   Bipolar 1 disorder ([place]) 05/25/2016   Insomnia 05/25/2016   Monochorionic diamniotic twin gestation in third trimester 04/25/2016    Conditions to be addressed/monitored per PCP order:   housing resources  There are no care plans that you recently modified to display for this patient.   Follow up:  Patient agrees to Care Plan and Follow-up.  Plan: The Managed Medicaid care management team will reach out to the patient again over the next 14 days.  Date/time of next scheduled Social Work care management/care coordination outreach:  11/15/21  Mickel Fuchs, Arita Miss, [place] Medicaid Team  [phone]

## 2021-10-27 ENCOUNTER — Other Ambulatory Visit: Payer: Self-pay

## 2021-10-27 ENCOUNTER — Ambulatory Visit
Admission: RE | Admit: 2021-10-27 | Discharge: 2021-10-27 | Disposition: A | Discharge status: Home / Self Care | Payer: Commercial Managed Care - HMO | Source: Ambulatory Visit | Attending: Radiation Oncology | Admitting: Radiation Oncology

## 2021-10-27 ENCOUNTER — Encounter: Payer: Self-pay | Admitting: Licensed Clinical Social Worker

## 2021-10-27 DIAGNOSIS — Z79899 Other long term (current) drug therapy: Secondary | ICD-10-CM | POA: Diagnosis not present

## 2021-10-27 DIAGNOSIS — Z5112 Encounter for antineoplastic immunotherapy: Secondary | ICD-10-CM | POA: Diagnosis not present

## 2021-10-27 DIAGNOSIS — C50412 Malignant neoplasm of upper-outer quadrant of left female breast: Secondary | ICD-10-CM | POA: Diagnosis not present

## 2021-10-27 DIAGNOSIS — Z510 Encounter for antineoplastic radiation therapy: Secondary | ICD-10-CM | POA: Diagnosis not present

## 2021-10-27 LAB — RAD ONC ARIA SESSION SUMMARY
Course Elapsed Days: 30
Plan Fractions Treated to Date: 17
Plan Fractions Treated to Date: 9
Plan Prescribed Dose Per Fraction: 2 Gy
Plan Prescribed Dose Per Fraction: 2 Gy
Plan Total Fractions Prescribed: 13
Plan Total Fractions Prescribed: 25
Plan Total Prescribed Dose: 26 Gy
Plan Total Prescribed Dose: 50 Gy
Reference Point Dosage Given to Date: 34 Gy
Reference Point Dosage Given to Date: 34 Gy
Reference Point Session Dosage Given: 2 Gy
Reference Point Session Dosage Given: 2 Gy
Session Number: 17

## 2021-10-27 NOTE — Progress Notes (Signed)
[place] CSW Progress Note  Holiday representative  provided pt w/ a food bag from [employer].      Henriette Combs, LCSW    Patient is participating in a Managed Medicaid Plan:  Yes

## 2021-10-28 ENCOUNTER — Other Ambulatory Visit: Payer: Self-pay

## 2021-10-28 ENCOUNTER — Other Ambulatory Visit: Payer: Commercial Managed Care - HMO

## 2021-10-28 ENCOUNTER — Inpatient Hospital Stay (HOSPITAL_BASED_OUTPATIENT_CLINIC_OR_DEPARTMENT_OTHER): Payer: Commercial Managed Care - HMO | Admitting: Adult Health

## 2021-10-28 ENCOUNTER — Inpatient Hospital Stay: Payer: Commercial Managed Care - HMO

## 2021-10-28 ENCOUNTER — Ambulatory Visit
Admission: RE | Admit: 2021-10-28 | Discharge: 2021-10-28 | Disposition: A | Discharge status: Home / Self Care | Payer: Commercial Managed Care - HMO | Source: Ambulatory Visit | Attending: Radiation Oncology | Admitting: Radiation Oncology

## 2021-10-28 ENCOUNTER — Ambulatory Visit: Payer: Commercial Managed Care - HMO | Admitting: Physician Assistant

## 2021-10-28 ENCOUNTER — Telehealth: Payer: Self-pay | Admitting: Adult Health

## 2021-10-28 DIAGNOSIS — Z510 Encounter for antineoplastic radiation therapy: Secondary | ICD-10-CM | POA: Diagnosis not present

## 2021-10-28 DIAGNOSIS — Z95828 Presence of other vascular implants and grafts: Secondary | ICD-10-CM

## 2021-10-28 DIAGNOSIS — Z170 Estrogen receptor positive status [ER+]: Secondary | ICD-10-CM

## 2021-10-28 DIAGNOSIS — C50412 Malignant neoplasm of upper-outer quadrant of left female breast: Secondary | ICD-10-CM

## 2021-10-28 DIAGNOSIS — Z5112 Encounter for antineoplastic immunotherapy: Secondary | ICD-10-CM | POA: Diagnosis not present

## 2021-10-28 DIAGNOSIS — Z79899 Other long term (current) drug therapy: Secondary | ICD-10-CM | POA: Diagnosis not present

## 2021-10-28 LAB — RAD ONC ARIA SESSION SUMMARY
Course Elapsed Days: 31
Plan Fractions Treated to Date: 18
Plan Fractions Treated to Date: 9
Plan Prescribed Dose Per Fraction: 2 Gy
Plan Prescribed Dose Per Fraction: 2 Gy
Plan Total Fractions Prescribed: 12
Plan Total Fractions Prescribed: 25
Plan Total Prescribed Dose: 24 Gy
Plan Total Prescribed Dose: 50 Gy
Reference Point Dosage Given to Date: 36 Gy
Reference Point Dosage Given to Date: 36 Gy
Reference Point Session Dosage Given: 2 Gy
Reference Point Session Dosage Given: 2 Gy
Session Number: 18

## 2021-10-28 LAB — CMP (CANCER CENTER ONLY)
ALT: 8 U/L (ref 0–44)
AST: 13 U/L — ABNORMAL LOW (ref 15–41)
Albumin: 3.8 g/dL (ref 3.5–5.0)
Alkaline Phosphatase: 65 U/L (ref 38–126)
Anion gap: 5 (ref 5–15)
BUN: 15 mg/dL (ref 6–20)
CO2: 30 mmol/L (ref 22–32)
Calcium: 9.5 mg/dL (ref 8.9–10.3)
Chloride: 104 mmol/L (ref 98–111)
Creatinine: 1.32 mg/dL — ABNORMAL HIGH (ref 0.44–1.00)
GFR, Estimated: 54 mL/min — ABNORMAL LOW (ref >60)
Glucose, Bld: 88 mg/dL (ref 70–99)
Potassium: 3.8 mmol/L (ref 3.5–5.1)
Sodium: 139 mmol/L (ref 135–145)
Total Bilirubin: 0.2 mg/dL — ABNORMAL LOW (ref 0.3–1.2)
Total Protein: 6.8 g/dL (ref 6.5–8.1)

## 2021-10-28 LAB — CBC WITH DIFFERENTIAL (CANCER CENTER ONLY)
Abs Immature Granulocytes: 0 10*3/uL (ref 0.00–0.07)
Basophils Absolute: 0 10*3/uL (ref 0.0–0.1)
Basophils Relative: 1 %
Eosinophils Absolute: 0.5 10*3/uL (ref 0.0–0.5)
Eosinophils Relative: 12 %
HCT: 32.8 % — ABNORMAL LOW (ref 36.0–46.0)
Hemoglobin: 10.8 g/dL — ABNORMAL LOW (ref 12.0–15.0)
Immature Granulocytes: 0 %
Lymphocytes Relative: 23 %
Lymphs Abs: 1 10*3/uL (ref 0.7–4.0)
MCH: 28.3 pg (ref 26.0–34.0)
MCHC: 32.9 g/dL (ref 30.0–36.0)
MCV: 86.1 fL (ref 80.0–100.0)
Monocytes Absolute: 0.4 10*3/uL (ref 0.1–1.0)
Monocytes Relative: 9 %
Neutro Abs: 2.4 10*3/uL (ref 1.7–7.7)
Neutrophils Relative %: 55 %
Platelet Count: 224 10*3/uL (ref 150–400)
RBC: 3.81 MIL/uL — ABNORMAL LOW (ref 3.87–5.11)
RDW: 15.4 % (ref 11.5–15.5)
WBC Count: 4.4 10*3/uL (ref 4.0–10.5)
nRBC: 0 % (ref 0.0–0.2)

## 2021-10-28 MED ORDER — HEPARIN SOD (PORK) LOCK FLUSH 100 UNIT/ML IV SOLN: 500.0000 [IU] | Freq: Once | INTRAVENOUS | Status: DC

## 2021-10-28 MED ORDER — HEPARIN SOD (PORK) LOCK FLUSH 100 UNIT/ML IV SOLN
500.0000 [IU] | Freq: Once | INTRAVENOUS | Status: AC
  Administered 2021-10-28: 500 [IU]

## 2021-10-28 MED ORDER — SODIUM CHLORIDE 0.9% FLUSH
10.0000 mL | Freq: Once | INTRAVENOUS | Status: AC
  Administered 2021-10-28: 10 mL

## 2021-10-28 MED ORDER — SODIUM CHLORIDE 0.9% FLUSH: 10.0000 mL | Freq: Once | INTRAVENOUS | Status: DC

## 2021-10-28 NOTE — Telephone Encounter (Signed)
.  Called patient to schedule appointment per 6/30 inbasket, patient is aware of date and time.

## 2021-10-28 NOTE — Progress Notes (Signed)
Robin Horn did not feel well and wanted to return next week for her appt and treatment which we were happy to accommodate.  She was not seen today.

## 2021-10-28 NOTE — Addendum Note (Signed)
Addended by: Tama Headings B on: 10/28/2021 01:27 PM   Modules accepted: Orders

## 2021-10-29 DIAGNOSIS — Z419 Encounter for procedure for purposes other than remedying health state, unspecified: Secondary | ICD-10-CM | POA: Diagnosis not present

## 2021-10-31 ENCOUNTER — Other Ambulatory Visit: Payer: Self-pay

## 2021-10-31 ENCOUNTER — Ambulatory Visit: Payer: Commercial Managed Care - HMO

## 2021-10-31 ENCOUNTER — Encounter: Payer: Self-pay | Admitting: Adult Health

## 2021-10-31 ENCOUNTER — Ambulatory Visit
Admission: RE | Admit: 2021-10-31 | Discharge: 2021-10-31 | Disposition: A | Discharge status: Home / Self Care | Payer: Commercial Managed Care - HMO | Source: Ambulatory Visit | Attending: Radiation Oncology | Admitting: Radiation Oncology

## 2021-10-31 ENCOUNTER — Encounter: Payer: Self-pay | Admitting: Hematology and Oncology

## 2021-10-31 ENCOUNTER — Inpatient Hospital Stay: Payer: Commercial Managed Care - HMO

## 2021-10-31 ENCOUNTER — Inpatient Hospital Stay (HOSPITAL_BASED_OUTPATIENT_CLINIC_OR_DEPARTMENT_OTHER): Payer: Commercial Managed Care - HMO | Admitting: Adult Health

## 2021-10-31 VITALS — BP 158/99 | HR 78 | Temp 98.4°F | Resp 18 | Ht 62.0 in | Wt 153.2 lb

## 2021-10-31 DIAGNOSIS — Z95828 Presence of other vascular implants and grafts: Secondary | ICD-10-CM

## 2021-10-31 DIAGNOSIS — Z170 Estrogen receptor positive status [ER+]: Secondary | ICD-10-CM

## 2021-10-31 DIAGNOSIS — Z79899 Other long term (current) drug therapy: Secondary | ICD-10-CM | POA: Insufficient documentation

## 2021-10-31 DIAGNOSIS — Z5112 Encounter for antineoplastic immunotherapy: Secondary | ICD-10-CM | POA: Insufficient documentation

## 2021-10-31 DIAGNOSIS — C50412 Malignant neoplasm of upper-outer quadrant of left female breast: Secondary | ICD-10-CM

## 2021-10-31 LAB — CBC WITH DIFFERENTIAL (CANCER CENTER ONLY)
Abs Immature Granulocytes: 0.01 10*3/uL (ref 0.00–0.07)
Basophils Absolute: 0 10*3/uL (ref 0.0–0.1)
Basophils Relative: 0 %
Eosinophils Absolute: 0.4 10*3/uL (ref 0.0–0.5)
Eosinophils Relative: 7 %
HCT: 31.4 % — ABNORMAL LOW (ref 36.0–46.0)
Hemoglobin: 10.6 g/dL — ABNORMAL LOW (ref 12.0–15.0)
Immature Granulocytes: 0 %
Lymphocytes Relative: 28 %
Lymphs Abs: 1.4 10*3/uL (ref 0.7–4.0)
MCH: 29 pg (ref 26.0–34.0)
MCHC: 33.8 g/dL (ref 30.0–36.0)
MCV: 85.8 fL (ref 80.0–100.0)
Monocytes Absolute: 0.4 10*3/uL (ref 0.1–1.0)
Monocytes Relative: 8 %
Neutro Abs: 2.7 10*3/uL (ref 1.7–7.7)
Neutrophils Relative %: 57 %
Platelet Count: 278 10*3/uL (ref 150–400)
RBC: 3.66 MIL/uL — ABNORMAL LOW (ref 3.87–5.11)
RDW: 15.2 % (ref 11.5–15.5)
WBC Count: 4.9 10*3/uL (ref 4.0–10.5)
nRBC: 0 % (ref 0.0–0.2)

## 2021-10-31 LAB — RAD ONC ARIA SESSION SUMMARY
Course Elapsed Days: 34
Plan Fractions Treated to Date: 10
Plan Fractions Treated to Date: 19
Plan Prescribed Dose Per Fraction: 2 Gy
Plan Prescribed Dose Per Fraction: 2 Gy
Plan Total Fractions Prescribed: 13
Plan Total Fractions Prescribed: 25
Plan Total Prescribed Dose: 26 Gy
Plan Total Prescribed Dose: 50 Gy
Reference Point Dosage Given to Date: 38 Gy
Reference Point Dosage Given to Date: 38 Gy
Reference Point Session Dosage Given: 2 Gy
Reference Point Session Dosage Given: 2 Gy
Session Number: 19

## 2021-10-31 LAB — CMP (CANCER CENTER ONLY)
ALT: 10 U/L (ref 0–44)
AST: 15 U/L (ref 15–41)
Albumin: 3.9 g/dL (ref 3.5–5.0)
Alkaline Phosphatase: 62 U/L (ref 38–126)
Anion gap: 7 (ref 5–15)
BUN: 13 mg/dL (ref 6–20)
CO2: 28 mmol/L (ref 22–32)
Calcium: 9.3 mg/dL (ref 8.9–10.3)
Chloride: 107 mmol/L (ref 98–111)
Creatinine: 1.6 mg/dL — ABNORMAL HIGH (ref 0.44–1.00)
GFR, Estimated: 43 mL/min — ABNORMAL LOW (ref >60)
Glucose, Bld: 73 mg/dL (ref 70–99)
Potassium: 3.7 mmol/L (ref 3.5–5.1)
Sodium: 142 mmol/L (ref 135–145)
Total Bilirubin: 0.2 mg/dL — ABNORMAL LOW (ref 0.3–1.2)
Total Protein: 6.9 g/dL (ref 6.5–8.1)

## 2021-10-31 MED ORDER — DIPHENHYDRAMINE HCL 25 MG PO CAPS: 25.0000 mg | ORAL_CAPSULE | Freq: Once | ORAL | Status: DC

## 2021-10-31 MED ORDER — SODIUM CHLORIDE 0.9 % IV SOLN
420.0000 mg | Freq: Once | INTRAVENOUS | Status: AC
  Administered 2021-10-31: 420 mg via INTRAVENOUS
  Filled 2021-10-31: qty 14

## 2021-10-31 MED ORDER — HEPARIN SOD (PORK) LOCK FLUSH 100 UNIT/ML IV SOLN: 500.0000 [IU] | Freq: Once | INTRAVENOUS | Status: DC

## 2021-10-31 MED ORDER — SODIUM CHLORIDE 0.9% FLUSH
10.0000 mL | Freq: Once | INTRAVENOUS | Status: AC
  Administered 2021-10-31: 10 mL

## 2021-10-31 MED ORDER — TRASTUZUMAB-DKST CHEMO 150 MG IV SOLR
6.0000 mg/kg | Freq: Once | INTRAVENOUS | Status: AC
  Administered 2021-10-31: 399 mg via INTRAVENOUS
  Filled 2021-10-31: qty 19

## 2021-10-31 MED ORDER — SODIUM CHLORIDE 0.9% FLUSH
10.0000 mL | INTRAVENOUS | Status: DC | PRN
  Administered 2021-10-31: 10 mL

## 2021-10-31 MED ORDER — HEPARIN SOD (PORK) LOCK FLUSH 100 UNIT/ML IV SOLN
500.0000 [IU] | Freq: Once | INTRAVENOUS | Status: AC | PRN
  Administered 2021-10-31: 500 [IU]

## 2021-10-31 MED ORDER — SODIUM CHLORIDE 0.9 % IV SOLN: Freq: Once | INTRAVENOUS | Status: AC

## 2021-10-31 MED ORDER — ACETAMINOPHEN 325 MG PO TABS: 650.0000 mg | ORAL_TABLET | Freq: Once | ORAL | Status: DC

## 2021-10-31 NOTE — Assessment & Plan Note (Signed)
Robin Horn is a 36 year old woman diagnosed in August 2022 with stage Ib triple positive breast cancer status post neoadjuvant chemotherapy, left mastectomy, currently receiving maintenance immunotherapy with Herceptin and Perjeta, along with adjuvant radiation.  [name] is doing well today.  She has no clinical signs of breast cancer recurrence.  She will continue on Herceptin Perjeta given every 3 weeks if she is tolerating this well until she completes 1 year of treatment which will be in September 2023.  She is continuing on adjuvant radiation therapy and should finish July 19.  We will continue to see [name] every 3 weeks for Herceptin Perjeta and she will have an office visit with a provider with every other treatment.  Her most recent echocardiogram was normal and we are following these every 3 months her next 1 is due in September 2023.

## 2021-10-31 NOTE — Progress Notes (Signed)
Patient took her '650mg'$  of tylenol and '50mg'$  of benadryl PO at 11:30. Spoke with Dicie Beam, [facility] and discussed. Patient does not need to take today's premeds to start her treatment.  Patient tolerated her treatment well today- no S/S of any reaction. Patient chose not to stay for her 30 minute observation period. VSS- see flowsheet.

## 2021-10-31 NOTE — Progress Notes (Signed)
[facility] Follow up:    Robin Gordon, DO [address] [place] [address]   DIAGNOSIS:  Cancer Staging  Malignant neoplasm of upper-outer quadrant of left breast in female, estrogen receptor positive ([place]) Staging form: Breast, AJCC 8th Edition - Clinical stage from 12/23/2020: Stage IB (cT2, cN1, cM0, G3, ER+, PR+, HER2+) - Signed by [name], MD on 12/23/2020 Stage prefix: Initial diagnosis Histologic grading system: 3 grade system   SUMMARY OF ONCOLOGIC HISTORY: Oncology History  Left breast lump (Resolved)  Malignant neoplasm of upper-outer quadrant of left breast in female, estrogen receptor positive ([place])  12/17/2020 Initial Diagnosis   Palpable lump in the upper outer left breast. 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, Her2+ (3+)/ER+ (80%)/PR- (<1%) in the left breast, and invasive ductal carcinoma, Her2+ (3+)/ER+ (80%)/PR+ (3%) in the left axillary lymph node   12/23/2020 Cancer Staging   Staging form: Breast, AJCC 8th Edition - Clinical stage from 12/23/2020: Stage IB (cT2, cN1, cM0, G3, ER+, PR+, HER2+) - Signed by [name], MD on 12/23/2020 Stage prefix: Initial diagnosis Histologic grading system: 3 grade system   01/11/2021 - 04/29/2021 Chemotherapy   Patient is on Treatment Plan : BREAST  Docetaxel + Carboplatin + Trastuzumab + Pertuzumab  (TCHP) q21d      01/31/2021 Genetic Testing   Negative hereditary cancer genetic testing: no pathogenic variants detected in [facility] Common Hereditary Cancers +RNA Panel.  The report date is January 31, 2021.    The Common Hereditary Cancers + RNA Panel offered by [facility] includes sequencing, deletion/duplication, and RNA testing of the following 47 genes: APC, ATM, AXIN2, BARD1, BMPR1A,  BRCA1, BRCA2, BRIP1, CDH1, CDK4*, CDKN2A (p14ARF)*, CDKN2A (p16INK4a)*, CHEK2, CTNNA1, DICER1, EPCAM (Deletion/duplication testing only), GREM1 (promoter region deletion/duplication testing only), KIT, MEN1, MLH1, MSH2, MSH3, MSH6, MUTYH, NBN, NF1, NHTL1, PALB2, PDGFRA*, PMS2, POLD1, POLE, PTEN, RAD50, RAD51C, RAD51D, SDHB, SDHC, SDHD, SMAD4, SMARCA4. STK11, TP53, TSC1, TSC2, and VHL.  The following genes were evaluated for sequence changes only: SDHA and HOXB13 c.251G>A variant only.  RNA analysis is not performed for the * genes.     05/23/2021 Surgery   Left mastectomy: Negative for residual cancer.  Extensive residual high-grade DCIS, 0/4 lymph nodes negative Right mastectomy: Benign Complete pathologic response   06/08/2021 -  Chemotherapy   Patient is on Treatment Plan : BREAST Trastuzumab  + Pertuzumab q21d x 13 cycles       CURRENT THERAPY:Herceptin/Perjeta  INTERVAL HISTORY: Robin Horn 36 y.o. female returns for follow-up of her triple positive breast cancer currently on treatment with adjuvant Herceptin and Perjeta.  She is tolerating treatment well.  She did not feel well last week she said she was very anxious and needed to leave following radiation.  We therefore moved her appointment today and she is feeling much better today.  Her most recent echocardiogram was completed on October 24, 2021 and showed a normal ejection fraction of 55 to 60%.  She is also receiving adjuvant radiation therapy and is tolerating this well.  She notes that during the days at home she is spending time with her 77-year-old twin girls who keep her very busy.  She denies any other significant concerns today.   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   Genetic testing 02/03/2021   Family history of breast cancer 01/18/2021   Port-A-Cath in place 01/18/2021   Malignant neoplasm of upper-outer quadrant of left breast in female, estrogen receptor positive ([place]) 12/23/2020   Urinary problem 11/15/2020   Hypertension 11/15/2020   Encounter for female sterilization procedure 09/24/2016   Supervision of other high risk pregnancy, antepartum 09/20/2016   GDM (gestational diabetes mellitus) 09/11/2016   Gestational hypertension without significant proteinuria 08/29/2016   Bipolar 1 disorder ([place]) 05/25/2016   Insomnia 05/25/2016   Monochorionic diamniotic twin gestation in third trimester 04/25/2016    is allergic to hydrocodone-acetaminophen, tramadol, and lactose intolerance (gi).  MEDICAL HISTORY: Past Medical History:  Diagnosis Date   Abnormal Pap smear of cervix    Anemia    Anxiety    Arthritis    Bipolar disorder ([place])    diagnosed years ago   Cancer ([place])    Chlamydia    Depression    Drug use affecting pregnancy, antepartum 05/25/2016   History of cocaine use   Encounter for dental examination 04/12/2021   Family history of breast cancer 01/18/2021   GDM (gestational diabetes mellitus)    GERD (gastroesophageal reflux disease)    pt used to take omeprazole, doesn't have much issues with it anymore   Gonorrhea    Herpes genitalia    History of hiatal hernia    History of postpartum hemorrhage, currently pregnant 05/25/2016   Migraines    Ovarian cyst    Polycystic kidney disease    Positive GBS test 09/23/2016   Preeclampsia    Pregnancy affected by fetal growth restriction 09/20/2016   Baby B. Getting weekly dopplers, AFI.    Pregnancy induced hypertension     SURGICAL HISTORY: Past Surgical History:  Procedure Laterality Date   BREAST RECONSTRUCTION WITH PLACEMENT OF TISSUE EXPANDER AND FLEX  HD (ACELLULAR HYDRATED DERMIS) Bilateral 05/23/2021   Procedure: BREAST RECONSTRUCTION WITH PLACEMENT OF TISSUE EXPANDER AND FLEX HD (ACELLULAR HYDRATED DERMIS);  Surgeon: Cindra Presume, MD;  Location: [place] [facility];  Service: Plastics;  Laterality: Bilateral;   COLPOSCOPY     DILATION AND CURETTAGE OF UTERUS     MASTECTOMY W/ SENTINEL NODE BIOPSY Left 05/23/2021   Procedure: LEFT MASTECTOMY WITH LEFT AXILLARY SENTINEL LYMPH NODE BIOPSY;  Surgeon: Rolm Bookbinder, MD;  Location: [facility];  Service: General;  Laterality: Left;   PORT A CATH REVISION Right 01/31/2021   Procedure: PORT A CATH REVISION;  Surgeon: Rolm Bookbinder, MD;  Location: [facility];  Service: General;  Laterality: Right;   PORTACATH PLACEMENT N/A 01/06/2021   Procedure: INSERTION PORT-A-CATH;  Surgeon: Rolm Bookbinder, MD;  Location: [facility];  Service: General;  Laterality: N/A;   RADIOACTIVE SEED GUIDED AXILLARY SENTINEL LYMPH NODE Left 05/23/2021   Procedure: RADIOACTIVE SEED GUIDED LEFT AXILLARY SENTINEL LYMPH NODE EXCISION;  Surgeon: Rolm Bookbinder, MD;  Location: [facility];  Service: General;  Laterality: Left;   TONSILLECTOMY     TOTAL MASTECTOMY Right 05/23/2021   Procedure: RIGHT TOTAL MASTECTOMY;  Surgeon: Rolm Bookbinder, MD;  Location: [facility];  Service: General;  Laterality: Right;   TUBAL LIGATION Bilateral 09/24/2016   Procedure: POST PARTUM  TUBAL LIGATION;  Surgeon: Lavonia Drafts, MD;  Location: [facility];  Service: Gynecology;  Laterality: Bilateral;    SOCIAL HISTORY: Social History   Socioeconomic History   Marital status: Single    Spouse name: Not on file   Number of children: Not on file   Years of education: Not on file   Highest education level: Not on file  Occupational History   Not on file  Tobacco Use   Smoking status: Every Day    Packs/day: 0.30    Types: Cigarettes   Smokeless tobacco: Never   Tobacco comments:    Wants to restart Nicotine  Vaping Use    Vaping Use: Never used  Substance and Sexual Activity   Alcohol use: Not Currently    Alcohol/week: 1.0 standard drink of alcohol    Types: 1 Glasses of wine per week   Drug use: Not Currently   Sexual activity: Yes    Birth control/protection: None, Surgical    Comment: Tubal  Other Topics Concern   Not on file  Social History Narrative   Not on file   Social Determinants of Health   Financial Resource Strain: High Risk (10/04/2021)   Overall Financial Resource Strain (CARDIA)    Difficulty of Paying Living Expenses: Very hard  Food Insecurity: No Food Insecurity (10/26/2021)   Hunger Vital Sign    Worried About Running Out of Food in the Last Year: Never true    Ran Out of Food in the Last Year: Never true  Transportation Needs: No Transportation Needs (10/26/2021)   PRAPARE - Hydrologist (Medical): No    Lack of Transportation (Non-Medical): No  Physical Activity: Not on file  Stress: Stress Concern Present (10/26/2021)   [place]    Feeling of Stress : To some extent  Social Connections: Not on file  Intimate Partner Violence: Not on file    FAMILY HISTORY: Family History  Problem Relation Age of Onset   Kidney disease Mother    Breast cancer Maternal Aunt        dx 40s   Breast cancer Other        MGM's sister and MGM's niece; dx after 30   Other Neg Hx     Review of Systems  Constitutional:  Negative for appetite change, chills, fatigue, fever and unexpected weight change.  HENT:   Negative for hearing loss, lump/mass and trouble swallowing.   Eyes:  Negative for eye problems and icterus.  Respiratory:  Negative for chest tightness, cough and shortness of breath.   Cardiovascular:  Negative for chest pain, leg swelling and palpitations.  Gastrointestinal:  Negative for abdominal distention, abdominal pain, constipation, diarrhea, nausea and vomiting.  Endocrine:  Negative for hot flashes.  Genitourinary:  Negative for difficulty urinating.   Musculoskeletal:  Negative for arthralgias.  Skin:  Negative for itching and rash.  Neurological:  Negative for dizziness, extremity weakness, headaches and numbness.  Hematological:  Negative for adenopathy. Does not bruise/bleed easily.  Psychiatric/Behavioral:  Negative for depression. The patient is not nervous/anxious.       PHYSICAL EXAMINATION  ECOG PERFORMANCE STATUS: 1 - Symptomatic but completely ambulatory  Vitals:   10/31/21 1249  BP: (!) 158/99  Pulse: 78  Resp: 18  Temp: 98.4 F (36.9 C)  SpO2: 100%    Physical Exam Constitutional:      General: She is not in acute distress.  Appearance: Normal appearance. She is not toxic-appearing.  HENT:     Head: Normocephalic and atraumatic.  Eyes:     General: No scleral icterus. Cardiovascular:     Rate and Rhythm: Normal rate and regular rhythm.     Pulses: Normal pulses.     Heart sounds: Normal heart sounds.  Pulmonary:     Effort: Pulmonary effort is normal.     Breath sounds: Normal breath sounds.  Abdominal:     General: Abdomen is flat. Bowel sounds are normal. There is no distension.     Palpations: Abdomen is soft.     Tenderness: There is no abdominal tenderness.  Musculoskeletal:        General: No swelling.     Cervical back: Neck supple.  Lymphadenopathy:     Cervical: No cervical adenopathy.  Skin:    General: Skin is warm and dry.     Findings: No rash.  Neurological:     General: No focal deficit present.     Mental Status: She is alert.  Psychiatric:        Mood and Affect: Mood normal.        Behavior: Behavior normal.     LABORATORY DATA:  CBC    Component Value Date/Time   WBC 4.9 10/31/2021 1226   WBC 6.1 05/17/2021 1600   RBC 3.66 (L) 10/31/2021 1226   HGB 10.6 (L) 10/31/2021 1226   HGB 11.2 09/08/2016 0848   HCT 31.4 (L) 10/31/2021 1226   HCT 34.7 09/08/2016 0848   PLT 278 10/31/2021 1226    PLT 193 09/08/2016 0848   MCV 85.8 10/31/2021 1226   MCV 89 09/08/2016 0848   MCH 29.0 10/31/2021 1226   MCHC 33.8 10/31/2021 1226   RDW 15.2 10/31/2021 1226   RDW 16.1 (H) 09/08/2016 0848   LYMPHSABS 1.4 10/31/2021 1226   MONOABS 0.4 10/31/2021 1226   EOSABS 0.4 10/31/2021 1226   BASOSABS 0.0 10/31/2021 1226    CMP     Component Value Date/Time   NA 142 10/31/2021 1226   K 3.7 10/31/2021 1226   CL 107 10/31/2021 1226   CO2 28 10/31/2021 1226   GLUCOSE 73 10/31/2021 1226   BUN 13 10/31/2021 1226   CREATININE 1.60 (H) 10/31/2021 1226   CALCIUM 9.3 10/31/2021 1226   PROT 6.9 10/31/2021 1226   ALBUMIN 3.9 10/31/2021 1226   AST 15 10/31/2021 1226   ALT 10 10/31/2021 1226   ALKPHOS 62 10/31/2021 1226   BILITOT 0.2 (L) 10/31/2021 1226   GFRNONAA 43 (L) 10/31/2021 1226   GFRAA >60 09/18/2018 2340    ASSESSMENT and THERAPY PLAN:   Malignant neoplasm of upper-outer quadrant of left breast in female, estrogen receptor positive ([place]) Robin Horn is a 36 year old woman diagnosed in August 2022 with stage Ib triple positive breast cancer status post neoadjuvant chemotherapy, left mastectomy, currently receiving maintenance immunotherapy with Herceptin and Perjeta, along with adjuvant radiation.  Robin Horn is doing well today.  She has no clinical signs of breast cancer recurrence.  She will continue on Herceptin Perjeta given every 3 weeks if she is tolerating this well until she completes 1 year of treatment which will be in September 2023.  She is continuing on adjuvant radiation therapy and should finish July 19.  We will continue to see Robin Horn every 3 weeks for Herceptin Perjeta and she will have an office visit with a provider with every other treatment.  Her most recent echocardiogram was normal and we are  following these every 3 months her next 1 is due in September 2023.   All questions were answered. The patient knows to call the clinic with any problems, questions or concerns.  We can certainly see the patient much sooner if necessary.  Total encounter time:20 minutes*in face-to-face visit time, chart review, lab review, care coordination, order entry, and documentation of the encounter time.    Wilber Bihari, NP 10/31/21 3:24 PM Medical Oncology and Hematology [facility] [place], [place] [address] Tel. [phone]    Fax. [phone]  *Total Encounter Time as defined by the Centers for Medicare and Medicaid Services includes, in addition to the face-to-face time of a patient visit (documented in the note above) non-face-to-face time: obtaining and reviewing outside history, ordering and reviewing medications, tests or procedures, care coordination (communications with other health care professionals or caregivers) and documentation in the medical record.

## 2021-10-31 NOTE — Patient Instructions (Signed)
[facility] ONCOLOGY  Discharge Instructions: Thank you for choosing [facility] to provide your oncology and hematology care.   If you have a lab appointment with the [facility], please go directly to the [facility] and check in at the registration area.   Wear comfortable clothing and clothing appropriate for easy access to any Portacath or PICC line.   We strive to give you quality time with your provider. You may need to reschedule your appointment if you arrive late (15 or more minutes).  Arriving late affects you and other patients whose appointments are after yours.  Also, if you miss three or more appointments without notifying the office, you may be dismissed from the clinic at the provider's discretion.      For prescription refill requests, have your pharmacy contact our office and allow 72 hours for refills to be completed.    Today you received the following chemotherapy and/or immunotherapy agents: Trastuzumab, Pertuzumab.      To help prevent nausea and vomiting after your treatment, we encourage you to take your nausea medication as directed.  BELOW ARE SYMPTOMS THAT SHOULD BE REPORTED IMMEDIATELY: *FEVER GREATER THAN 100.4 F (38 C) OR HIGHER *CHILLS OR SWEATING *NAUSEA AND VOMITING THAT IS NOT CONTROLLED WITH YOUR NAUSEA MEDICATION *UNUSUAL SHORTNESS OF BREATH *UNUSUAL BRUISING OR BLEEDING *URINARY PROBLEMS (pain or burning when urinating, or frequent urination) *BOWEL PROBLEMS (unusual diarrhea, constipation, pain near the anus) TENDERNESS IN MOUTH AND THROAT WITH OR WITHOUT PRESENCE OF ULCERS (sore throat, sores in mouth, or a toothache) UNUSUAL RASH, SWELLING OR PAIN  UNUSUAL VAGINAL DISCHARGE OR ITCHING   Items with * indicate a potential emergency and should be followed up as soon as possible or go to the Emergency Department if any problems should occur.  Please show the CHEMOTHERAPY ALERT CARD or IMMUNOTHERAPY ALERT CARD  at check-in to the Emergency Department and triage nurse.  Should you have questions after your visit or need to cancel or reschedule your appointment, please contact [facility]  Dept: [phone]  and follow the prompts.  Office hours are 8:00 a.m. to 4:30 p.m. Monday - Friday. Please note that voicemails left after 4:00 p.m. may not be returned until the following business day.  We are closed weekends and major holidays. You have access to a nurse at all times for urgent questions. Please call the main number to the clinic Dept: [phone] and follow the prompts.   For any non-urgent questions, you may also contact your provider using MyChart. We now offer e-Visits for anyone 50 and older to request care online for non-urgent symptoms. For details visit [email or web address].   Also download the MyChart app! Go to the app store, search "MyChart", open the app, select [facility], and log in with your MyChart username and password.  Due to Covid, a mask is required upon entering the hospital/clinic. If you do not have a mask, one will be given to you upon arrival. For doctor visits, patients may have 1 support person aged 42 or older with them. For treatment visits, patients cannot have anyone with them due to current Covid guidelines and our immunocompromised population.

## 2021-11-02 ENCOUNTER — Ambulatory Visit: Payer: Commercial Managed Care - HMO

## 2021-11-02 ENCOUNTER — Other Ambulatory Visit: Payer: Self-pay

## 2021-11-02 ENCOUNTER — Telehealth: Payer: Self-pay | Admitting: Adult Health

## 2021-11-02 ENCOUNTER — Ambulatory Visit
Admission: RE | Admit: 2021-11-02 | Discharge: 2021-11-02 | Disposition: A | Discharge status: Home / Self Care | Payer: Commercial Managed Care - HMO | Source: Ambulatory Visit | Attending: Radiation Oncology | Admitting: Radiation Oncology

## 2021-11-02 DIAGNOSIS — C50412 Malignant neoplasm of upper-outer quadrant of left female breast: Secondary | ICD-10-CM | POA: Diagnosis not present

## 2021-11-02 DIAGNOSIS — Z170 Estrogen receptor positive status [ER+]: Secondary | ICD-10-CM | POA: Diagnosis not present

## 2021-11-02 LAB — RAD ONC ARIA SESSION SUMMARY
Course Elapsed Days: 36
Plan Fractions Treated to Date: 10
Plan Fractions Treated to Date: 20
Plan Prescribed Dose Per Fraction: 2 Gy
Plan Prescribed Dose Per Fraction: 2 Gy
Plan Total Fractions Prescribed: 12
Plan Total Fractions Prescribed: 25
Plan Total Prescribed Dose: 24 Gy
Plan Total Prescribed Dose: 50 Gy
Reference Point Dosage Given to Date: 40 Gy
Reference Point Dosage Given to Date: 40 Gy
Reference Point Session Dosage Given: 2 Gy
Reference Point Session Dosage Given: 2 Gy
Session Number: 20

## 2021-11-02 NOTE — Telephone Encounter (Signed)
Scheduled appointment per 7/3 los. Patient is aware.

## 2021-11-03 ENCOUNTER — Ambulatory Visit: Payer: Commercial Managed Care - HMO

## 2021-11-04 ENCOUNTER — Ambulatory Visit: Payer: Commercial Managed Care - HMO

## 2021-11-04 ENCOUNTER — Other Ambulatory Visit: Payer: Self-pay | Admitting: Hematology and Oncology

## 2021-11-04 ENCOUNTER — Inpatient Hospital Stay: Payer: Commercial Managed Care - HMO | Admitting: Licensed Clinical Social Worker

## 2021-11-04 NOTE — Progress Notes (Signed)
[facility] CSW Progress Note  Clinical Education officer, museum contacted patient by phone to follow-up on housing. Pt reports that her apartment is still not available and she is not sure when it will be. In the meantime, she is keeping in contact with community agencies [facility]) and is staying with her mom. She was able to recertify for food stamps and is no longer having an issue with obtaining food. They are short on household goods (TP, tissues), so CSW provided a bag from the [facility] pantry today.    Jamelia Varano E Juel Ripley, LCSW    Patient is participating in a Managed Medicaid Plan:  Yes

## 2021-11-07 ENCOUNTER — Other Ambulatory Visit: Payer: Self-pay

## 2021-11-07 ENCOUNTER — Ambulatory Visit: Payer: Commercial Managed Care - HMO

## 2021-11-07 ENCOUNTER — Ambulatory Visit
Admission: RE | Admit: 2021-11-07 | Discharge: 2021-11-07 | Disposition: A | Discharge status: Home / Self Care | Payer: Commercial Managed Care - HMO | Source: Ambulatory Visit | Attending: Radiation Oncology | Admitting: Radiation Oncology

## 2021-11-07 DIAGNOSIS — C50412 Malignant neoplasm of upper-outer quadrant of left female breast: Secondary | ICD-10-CM | POA: Diagnosis not present

## 2021-11-07 DIAGNOSIS — Z170 Estrogen receptor positive status [ER+]: Secondary | ICD-10-CM | POA: Diagnosis not present

## 2021-11-07 LAB — RAD ONC ARIA SESSION SUMMARY
Course Elapsed Days: 41
Plan Fractions Treated to Date: 11
Plan Fractions Treated to Date: 21
Plan Prescribed Dose Per Fraction: 2 Gy
Plan Prescribed Dose Per Fraction: 2 Gy
Plan Total Fractions Prescribed: 13
Plan Total Fractions Prescribed: 25
Plan Total Prescribed Dose: 26 Gy
Plan Total Prescribed Dose: 50 Gy
Reference Point Dosage Given to Date: 42 Gy
Reference Point Dosage Given to Date: 42 Gy
Reference Point Session Dosage Given: 2 Gy
Reference Point Session Dosage Given: 2 Gy
Session Number: 21

## 2021-11-08 ENCOUNTER — Encounter: Payer: Self-pay | Admitting: *Deleted

## 2021-11-08 ENCOUNTER — Ambulatory Visit: Payer: Commercial Managed Care - HMO

## 2021-11-09 ENCOUNTER — Ambulatory Visit: Payer: Commercial Managed Care - HMO

## 2021-11-09 ENCOUNTER — Other Ambulatory Visit: Payer: Self-pay

## 2021-11-09 DIAGNOSIS — C50412 Malignant neoplasm of upper-outer quadrant of left female breast: Secondary | ICD-10-CM | POA: Diagnosis not present

## 2021-11-09 DIAGNOSIS — Z170 Estrogen receptor positive status [ER+]: Secondary | ICD-10-CM | POA: Diagnosis not present

## 2021-11-09 LAB — RAD ONC ARIA SESSION SUMMARY
Course Elapsed Days: 43
Plan Fractions Treated to Date: 11
Plan Fractions Treated to Date: 22
Plan Prescribed Dose Per Fraction: 2 Gy
Plan Prescribed Dose Per Fraction: 2 Gy
Plan Total Fractions Prescribed: 12
Plan Total Fractions Prescribed: 25
Plan Total Prescribed Dose: 24 Gy
Plan Total Prescribed Dose: 50 Gy
Reference Point Dosage Given to Date: 44 Gy
Reference Point Dosage Given to Date: 44 Gy
Reference Point Session Dosage Given: 2 Gy
Reference Point Session Dosage Given: 2 Gy
Session Number: 22

## 2021-11-10 ENCOUNTER — Ambulatory Visit: Payer: Commercial Managed Care - HMO

## 2021-11-11 ENCOUNTER — Ambulatory Visit
Admission: RE | Admit: 2021-11-11 | Discharge: 2021-11-11 | Disposition: A | Discharge status: Home / Self Care | Payer: Commercial Managed Care - HMO | Source: Ambulatory Visit | Attending: Radiation Oncology | Admitting: Radiation Oncology

## 2021-11-11 ENCOUNTER — Ambulatory Visit: Payer: Commercial Managed Care - HMO

## 2021-11-11 ENCOUNTER — Encounter: Payer: Self-pay | Admitting: Licensed Clinical Social Worker

## 2021-11-11 ENCOUNTER — Other Ambulatory Visit: Payer: Self-pay

## 2021-11-11 DIAGNOSIS — C50412 Malignant neoplasm of upper-outer quadrant of left female breast: Secondary | ICD-10-CM | POA: Diagnosis not present

## 2021-11-11 DIAGNOSIS — Z170 Estrogen receptor positive status [ER+]: Secondary | ICD-10-CM | POA: Diagnosis not present

## 2021-11-11 LAB — RAD ONC ARIA SESSION SUMMARY
Course Elapsed Days: 45
Plan Fractions Treated to Date: 12
Plan Fractions Treated to Date: 23
Plan Prescribed Dose Per Fraction: 2 Gy
Plan Prescribed Dose Per Fraction: 2 Gy
Plan Total Fractions Prescribed: 13
Plan Total Fractions Prescribed: 25
Plan Total Prescribed Dose: 26 Gy
Plan Total Prescribed Dose: 50 Gy
Reference Point Dosage Given to Date: 46 Gy
Reference Point Dosage Given to Date: 46 Gy
Reference Point Session Dosage Given: 2 Gy
Reference Point Session Dosage Given: 2 Gy
Session Number: 23

## 2021-11-11 NOTE — Progress Notes (Signed)
[facility] CSW Progress Note  Patient came to support services inquiring about gas cards. Unfortunately she has utilized all of her assistance through grant funds and [employer] as well as has applied to cancer foundations. CSW reviewed transportation services through her insurance, including gas reimbursement. Gave printed information on the numbers to call and the process. Pt voiced understanding.    Anaiz Qazi E Veronique Warga, LCSW    Patient is participating in a Managed Medicaid Plan:  Yes

## 2021-11-14 ENCOUNTER — Ambulatory Visit
Admission: RE | Admit: 2021-11-14 | Discharge: 2021-11-14 | Disposition: A | Discharge status: Home / Self Care | Payer: Commercial Managed Care - HMO | Source: Ambulatory Visit | Attending: Radiation Oncology | Admitting: Radiation Oncology

## 2021-11-14 ENCOUNTER — Other Ambulatory Visit: Payer: Self-pay

## 2021-11-14 ENCOUNTER — Ambulatory Visit: Payer: Commercial Managed Care - HMO

## 2021-11-14 DIAGNOSIS — C50412 Malignant neoplasm of upper-outer quadrant of left female breast: Secondary | ICD-10-CM | POA: Diagnosis not present

## 2021-11-14 DIAGNOSIS — Z170 Estrogen receptor positive status [ER+]: Secondary | ICD-10-CM | POA: Diagnosis not present

## 2021-11-14 LAB — RAD ONC ARIA SESSION SUMMARY
Course Elapsed Days: 48
Plan Fractions Treated to Date: 12
Plan Fractions Treated to Date: 24
Plan Prescribed Dose Per Fraction: 2 Gy
Plan Prescribed Dose Per Fraction: 2 Gy
Plan Total Fractions Prescribed: 12
Plan Total Fractions Prescribed: 25
Plan Total Prescribed Dose: 24 Gy
Plan Total Prescribed Dose: 50 Gy
Reference Point Dosage Given to Date: 48 Gy
Reference Point Dosage Given to Date: 48 Gy
Reference Point Session Dosage Given: 2 Gy
Reference Point Session Dosage Given: 2 Gy
Session Number: 24

## 2021-11-15 ENCOUNTER — Ambulatory Visit: Payer: Commercial Managed Care - HMO

## 2021-11-15 ENCOUNTER — Other Ambulatory Visit: Payer: Self-pay

## 2021-11-15 ENCOUNTER — Telehealth: Payer: Self-pay | Admitting: Radiation Oncology

## 2021-11-15 NOTE — Patient Outreach (Signed)
Medicaid Managed Care Social Work Note  11/15/2021 Name:  Robin Horn MRN:  [id number] DOB:  08-22-1985  Robin Horn is an 36 y.o. year old female who is a primary patient of [name], DO.  The [facility] Managed Care Coordination team was consulted for assistance with:   housing   Robin Horn was given information about Medicaid Managed Care Coordination team services today. Blaine Hamper Patient agreed to services and verbal consent obtained.  Engaged with patient  for by telephone forfollow up visit in response to referral for case management and/or care coordination services.   Assessments/Interventions:  Review of past medical history, allergies, medications, health status, including review of consultants reports, laboratory and other test data, was performed as part of comprehensive evaluation and provision of chronic care management services.  SDOH: (Social Determinant of Health) assessments and interventions performed: BSW completed telephone outreach with patient. She stated she is currently homeless. She was living with her mother with her children and mom put her and her children out on last evening and she now has no where to go. BSW provided patient with the information for [facility] and the [facility] shelters via email. BSW will also contact [facility] apartments to see where patient is on the waitlist.   Advanced Directives Status:  Not addressed in this encounter.  Care Plan                 Allergies  Allergen Reactions   Hydrocodone-Acetaminophen Hives   Tramadol Itching   Lactose Intolerance (Gi) Diarrhea    Medications Reviewed Today     Reviewed by Gery Pray, MD (Physician) on 10/31/21 at 1735  Med List Status: <None>   Medication Order Taking? Sig Documenting Provider Last Dose Status Informant  acetaminophen (TYLENOL) 500 MG tablet [id number]  Take 1,000 mg by mouth every 6 (six) hours as needed (pain). [provider]  Active Self  heparin  lock flush 100 unit/mL [id number]   [name], MD  Active   lidocaine (XYLOCAINE) 2 % solution [id number]  Use as directed 15 mLs in the mouth or throat every 3 (three) hours as needed for mouth pain. Lewanda Rife  Active Pharmacy Records  lidocaine-prilocaine (EMLA) cream [id number]  Apply 1 application. topically as needed. [name], MD  Active   lisinopril (ZESTRIL) 10 MG tablet [id number]  Take 1 tablet (10 mg total) by mouth daily. Gery Pray, MD  Active   methocarbamol (ROBAXIN) 750 MG tablet [id number]  Take 1 tablet (750 mg total) by mouth 4 (four) times daily. Virl Axe, MD  Active   prochlorperazine (COMPAZINE) 10 MG tablet [id number]  Take 1 tablet (10 mg total) by mouth every 6 (six) hours as needed for nausea or vomiting. Gery Pray, MD  Active   sodium chloride flush (NS) 0.9 % injection 10 mL [id number]   [name], MD  Active   sodium chloride flush (NS) 0.9 % injection 10 mL [id number]   [name], MD  Active   zolpidem (AMBIEN) 5 MG tablet [id number]  TAKE 1 TABLET BY MOUTH AT BEDTIME AS NEEDED [name], MD  Active             Patient Active Problem List   Diagnosis Date Noted   Muscle spasm 10/12/2021   Encounter for cervical Pap smear with pelvic exam 10/12/2021   Housing instability 10/12/2021   S/P breast reconstruction 05/23/2021   Caries 04/12/2021   Teeth missing 04/12/2021   Accretions on  teeth 04/12/2021   Phobia of dental procedure 04/12/2021   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   Genetic testing 02/03/2021   Family history of breast cancer 01/18/2021   Port-A-Cath in place 01/18/2021   Malignant neoplasm of upper-outer quadrant of left breast in female, estrogen receptor positive ([place]) 12/23/2020   Urinary problem 11/15/2020   Hypertension 11/15/2020   Encounter for female sterilization procedure 09/24/2016   Supervision of other high risk pregnancy, antepartum  09/20/2016   GDM (gestational diabetes mellitus) 09/11/2016   Gestational hypertension without significant proteinuria 08/29/2016   Bipolar 1 disorder ([place]) 05/25/2016   Insomnia 05/25/2016   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   Follow up:  Patient agrees to Care Plan and Follow-up.  Plan: The Managed Medicaid care management team will reach out to the patient again over the next 14 days.  Date/time of next scheduled Social Work care management/care coordination outreach:  12/05/21  Mickel Fuchs, Arita Miss, [place] Medicaid Team  [phone]

## 2021-11-15 NOTE — Patient Instructions (Signed)
Visit Information  Ms. Delancy was given information about Medicaid Managed Care team care coordination services as a part of their [facility] Medicaid benefit. [place] verbally consented to engagement with the [facility] Managed Care team.   If you are experiencing a medical emergency, please call 911 or report to your local emergency department or urgent care.   If you have a non-emergency medical problem during routine business hours, please contact your provider's office and ask to speak with a nurse.   For questions related to your [facility] health plan, please call: [phone] or go here:[email or web address]  If you would like to schedule transportation through your [facility] plan, please call the following number at least 2 days in advance of your appointment: [phone].  You can also use the MTM portal or MTM mobile app to manage your rides. For the portal, please go to [email or web address].  Call the [facility] at [phone], at any time, 24 hours a day, 7 days a week. If you are in danger or need immediate medical attention call 911.  If you would like help to quit smoking, call 1-800-QUIT-NOW (313)050-3896) OR Espaol: 1-855-Djelo-Ya (3-559-741-6384) o para ms informacin haga clic aqu or Text READY to 200-400 to register via text  Ms. Dibiasio - following are the goals we discussed in your visit today:   Goals Addressed   None       Social Worker will follow up in 14 days .   Mickel Fuchs, BSW, [place] Managed Medicaid Team  [phone]   Following is a copy of your plan of care:  There are no care plans that you recently modified to display for this patient.

## 2021-11-15 NOTE — Telephone Encounter (Signed)
Patient called to cancel [place] for 7/18. L3 notified.

## 2021-11-16 ENCOUNTER — Ambulatory Visit: Payer: Commercial Managed Care - HMO

## 2021-11-17 ENCOUNTER — Ambulatory Visit: Payer: Commercial Managed Care - HMO

## 2021-11-17 ENCOUNTER — Ambulatory Visit
Admission: RE | Admit: 2021-11-17 | Discharge: 2021-11-17 | Disposition: A | Discharge status: Home / Self Care | Payer: Commercial Managed Care - HMO | Source: Ambulatory Visit | Attending: Radiation Oncology | Admitting: Radiation Oncology

## 2021-11-17 ENCOUNTER — Encounter: Payer: Self-pay | Admitting: *Deleted

## 2021-11-17 ENCOUNTER — Other Ambulatory Visit: Payer: Self-pay

## 2021-11-17 DIAGNOSIS — C50412 Malignant neoplasm of upper-outer quadrant of left female breast: Secondary | ICD-10-CM | POA: Diagnosis not present

## 2021-11-17 DIAGNOSIS — Z170 Estrogen receptor positive status [ER+]: Secondary | ICD-10-CM | POA: Diagnosis not present

## 2021-11-17 LAB — RAD ONC ARIA SESSION SUMMARY
Course Elapsed Days: 51
Plan Fractions Treated to Date: 13
Plan Fractions Treated to Date: 25
Plan Prescribed Dose Per Fraction: 2 Gy
Plan Prescribed Dose Per Fraction: 2 Gy
Plan Total Fractions Prescribed: 13
Plan Total Fractions Prescribed: 25
Plan Total Prescribed Dose: 26 Gy
Plan Total Prescribed Dose: 50 Gy
Reference Point Dosage Given to Date: 50 Gy
Reference Point Dosage Given to Date: 50 Gy
Reference Point Session Dosage Given: 2 Gy
Reference Point Session Dosage Given: 2 Gy
Session Number: 25

## 2021-11-18 ENCOUNTER — Ambulatory Visit: Payer: Commercial Managed Care - HMO

## 2021-11-21 ENCOUNTER — Ambulatory Visit
Admission: RE | Admit: 2021-11-21 | Discharge: 2021-11-21 | Disposition: A | Discharge status: Home / Self Care | Payer: Commercial Managed Care - HMO | Source: Ambulatory Visit | Attending: Radiation Oncology | Admitting: Radiation Oncology

## 2021-11-21 ENCOUNTER — Other Ambulatory Visit: Payer: Self-pay

## 2021-11-21 ENCOUNTER — Inpatient Hospital Stay: Payer: Commercial Managed Care - HMO

## 2021-11-21 ENCOUNTER — Ambulatory Visit: Payer: Commercial Managed Care - HMO

## 2021-11-21 DIAGNOSIS — C50412 Malignant neoplasm of upper-outer quadrant of left female breast: Secondary | ICD-10-CM | POA: Diagnosis not present

## 2021-11-21 DIAGNOSIS — Z170 Estrogen receptor positive status [ER+]: Secondary | ICD-10-CM | POA: Diagnosis not present

## 2021-11-21 LAB — RAD ONC ARIA SESSION SUMMARY
Course Elapsed Days: 55
Plan Fractions Treated to Date: 1
Plan Prescribed Dose Per Fraction: 2 Gy
Plan Total Fractions Prescribed: 5
Plan Total Prescribed Dose: 10 Gy
Reference Point Dosage Given to Date: 52 Gy
Reference Point Session Dosage Given: 2 Gy
Session Number: 26

## 2021-11-22 ENCOUNTER — Other Ambulatory Visit: Payer: Self-pay

## 2021-11-22 ENCOUNTER — Ambulatory Visit: Payer: Commercial Managed Care - HMO

## 2021-11-22 ENCOUNTER — Ambulatory Visit
Admission: RE | Admit: 2021-11-22 | Discharge: 2021-11-22 | Disposition: A | Discharge status: Home / Self Care | Payer: Commercial Managed Care - HMO | Source: Ambulatory Visit | Attending: Radiation Oncology | Admitting: Radiation Oncology

## 2021-11-22 ENCOUNTER — Inpatient Hospital Stay: Payer: Commercial Managed Care - HMO

## 2021-11-22 VITALS — BP 134/84 | HR 75 | Temp 99.0°F | Resp 18 | Wt 156.5 lb

## 2021-11-22 DIAGNOSIS — Z170 Estrogen receptor positive status [ER+]: Secondary | ICD-10-CM

## 2021-11-22 DIAGNOSIS — C50412 Malignant neoplasm of upper-outer quadrant of left female breast: Secondary | ICD-10-CM | POA: Diagnosis not present

## 2021-11-22 LAB — RAD ONC ARIA SESSION SUMMARY
Course Elapsed Days: 56
Plan Fractions Treated to Date: 2
Plan Prescribed Dose Per Fraction: 2 Gy
Plan Total Fractions Prescribed: 5
Plan Total Prescribed Dose: 10 Gy
Reference Point Dosage Given to Date: 54 Gy
Reference Point Session Dosage Given: 2 Gy
Session Number: 27

## 2021-11-22 MED ORDER — SODIUM CHLORIDE 0.9% FLUSH
10.0000 mL | INTRAVENOUS | Status: DC | PRN
  Administered 2021-11-22: 10 mL

## 2021-11-22 MED ORDER — ACETAMINOPHEN 325 MG PO TABS
650.0000 mg | ORAL_TABLET | Freq: Once | ORAL | Status: AC
  Administered 2021-11-22: 650 mg via ORAL
  Filled 2021-11-22: qty 2

## 2021-11-22 MED ORDER — SODIUM CHLORIDE 0.9 % IV SOLN: Freq: Once | INTRAVENOUS | Status: AC

## 2021-11-22 MED ORDER — HEPARIN SOD (PORK) LOCK FLUSH 100 UNIT/ML IV SOLN
500.0000 [IU] | Freq: Once | INTRAVENOUS | Status: AC | PRN
  Administered 2021-11-22: 500 [IU]

## 2021-11-22 MED ORDER — TRASTUZUMAB-DKST CHEMO 150 MG IV SOLR
6.0000 mg/kg | Freq: Once | INTRAVENOUS | Status: AC
  Administered 2021-11-22: 399 mg via INTRAVENOUS
  Filled 2021-11-22: qty 19

## 2021-11-22 MED ORDER — SODIUM CHLORIDE 0.9 % IV SOLN
420.0000 mg | Freq: Once | INTRAVENOUS | Status: AC
  Administered 2021-11-22: 420 mg via INTRAVENOUS
  Filled 2021-11-22: qty 14

## 2021-11-22 MED ORDER — DIPHENHYDRAMINE HCL 25 MG PO CAPS
25.0000 mg | ORAL_CAPSULE | Freq: Once | ORAL | Status: AC
  Administered 2021-11-22: 25 mg via ORAL
  Filled 2021-11-22: qty 1

## 2021-11-22 NOTE — Patient Instructions (Signed)
[facility] ONCOLOGY  Discharge Instructions: Thank you for choosing [facility] to provide your oncology and hematology care.   If you have a lab appointment with the [facility], please go directly to the [facility] and check in at the registration area.   Wear comfortable clothing and clothing appropriate for easy access to any Portacath or PICC line.   We strive to give you quality time with your provider. You may need to reschedule your appointment if you arrive late (15 or more minutes).  Arriving late affects you and other patients whose appointments are after yours.  Also, if you miss three or more appointments without notifying the office, you may be dismissed from the clinic at the provider's discretion.      For prescription refill requests, have your pharmacy contact our office and allow 72 hours for refills to be completed.    Today you received the following chemotherapy and/or immunotherapy agents: Trastuzumab and Pertuzumab      To help prevent nausea and vomiting after your treatment, we encourage you to take your nausea medication as directed.  BELOW ARE SYMPTOMS THAT SHOULD BE REPORTED IMMEDIATELY: *FEVER GREATER THAN 100.4 F (38 C) OR HIGHER *CHILLS OR SWEATING *NAUSEA AND VOMITING THAT IS NOT CONTROLLED WITH YOUR NAUSEA MEDICATION *UNUSUAL SHORTNESS OF BREATH *UNUSUAL BRUISING OR BLEEDING *URINARY PROBLEMS (pain or burning when urinating, or frequent urination) *BOWEL PROBLEMS (unusual diarrhea, constipation, pain near the anus) TENDERNESS IN MOUTH AND THROAT WITH OR WITHOUT PRESENCE OF ULCERS (sore throat, sores in mouth, or a toothache) UNUSUAL RASH, SWELLING OR PAIN  UNUSUAL VAGINAL DISCHARGE OR ITCHING   Items with * indicate a potential emergency and should be followed up as soon as possible or go to the Emergency Department if any problems should occur.  Please show the CHEMOTHERAPY ALERT CARD or IMMUNOTHERAPY ALERT  CARD at check-in to the Emergency Department and triage nurse.  Should you have questions after your visit or need to cancel or reschedule your appointment, please contact [facility]  Dept: [phone]  and follow the prompts.  Office hours are 8:00 a.m. to 4:30 p.m. Monday - Friday. Please note that voicemails left after 4:00 p.m. may not be returned until the following business day.  We are closed weekends and major holidays. You have access to a nurse at all times for urgent questions. Please call the main number to the clinic Dept: [phone] and follow the prompts.   For any non-urgent questions, you may also contact your provider using MyChart. We now offer e-Visits for anyone 110 and older to request care online for non-urgent symptoms. For details visit [email or web address].   Also download the MyChart app! Go to the app store, search "MyChart", open the app, select [facility], and log in with your MyChart username and password.  Masks are optional in the cancer centers. If you would like for your care team to wear a mask while they are taking care of you, please let them know. For doctor visits, patients may have with them one support person who is at least 35 years old. At this time, visitors are not allowed in the infusion area.

## 2021-11-23 ENCOUNTER — Ambulatory Visit
Admission: RE | Admit: 2021-11-23 | Discharge: 2021-11-23 | Disposition: A | Discharge status: Home / Self Care | Payer: Commercial Managed Care - HMO | Source: Ambulatory Visit | Attending: Radiation Oncology | Admitting: Radiation Oncology

## 2021-11-23 ENCOUNTER — Ambulatory Visit: Payer: Commercial Managed Care - HMO

## 2021-11-23 ENCOUNTER — Other Ambulatory Visit: Payer: Self-pay

## 2021-11-23 DIAGNOSIS — Z170 Estrogen receptor positive status [ER+]: Secondary | ICD-10-CM | POA: Diagnosis not present

## 2021-11-23 DIAGNOSIS — C50412 Malignant neoplasm of upper-outer quadrant of left female breast: Secondary | ICD-10-CM | POA: Diagnosis not present

## 2021-11-23 LAB — RAD ONC ARIA SESSION SUMMARY
Course Elapsed Days: 57
Plan Fractions Treated to Date: 3
Plan Prescribed Dose Per Fraction: 2 Gy
Plan Total Fractions Prescribed: 5
Plan Total Prescribed Dose: 10 Gy
Reference Point Dosage Given to Date: 56 Gy
Reference Point Session Dosage Given: 2 Gy
Session Number: 28

## 2021-11-24 ENCOUNTER — Ambulatory Visit: Payer: Commercial Managed Care - HMO

## 2021-11-24 ENCOUNTER — Other Ambulatory Visit: Payer: Self-pay

## 2021-11-24 ENCOUNTER — Ambulatory Visit
Admission: RE | Admit: 2021-11-24 | Discharge: 2021-11-24 | Disposition: A | Discharge status: Home / Self Care | Payer: Commercial Managed Care - HMO | Source: Ambulatory Visit | Attending: Radiation Oncology | Admitting: Radiation Oncology

## 2021-11-24 DIAGNOSIS — C50412 Malignant neoplasm of upper-outer quadrant of left female breast: Secondary | ICD-10-CM | POA: Diagnosis not present

## 2021-11-24 DIAGNOSIS — Z170 Estrogen receptor positive status [ER+]: Secondary | ICD-10-CM | POA: Diagnosis not present

## 2021-11-24 LAB — RAD ONC ARIA SESSION SUMMARY
Course Elapsed Days: 58
Plan Fractions Treated to Date: 4
Plan Prescribed Dose Per Fraction: 2 Gy
Plan Total Fractions Prescribed: 5
Plan Total Prescribed Dose: 10 Gy
Reference Point Dosage Given to Date: 58 Gy
Reference Point Session Dosage Given: 2 Gy
Session Number: 29

## 2021-11-25 ENCOUNTER — Encounter: Payer: Self-pay | Admitting: Radiation Oncology

## 2021-11-25 ENCOUNTER — Ambulatory Visit
Admission: RE | Admit: 2021-11-25 | Discharge: 2021-11-25 | Disposition: A | Discharge status: Home / Self Care | Payer: Commercial Managed Care - HMO | Source: Ambulatory Visit | Attending: Radiation Oncology | Admitting: Radiation Oncology

## 2021-11-25 ENCOUNTER — Ambulatory Visit: Payer: Commercial Managed Care - HMO

## 2021-11-25 ENCOUNTER — Other Ambulatory Visit: Payer: Self-pay | Admitting: Hematology and Oncology

## 2021-11-25 ENCOUNTER — Other Ambulatory Visit: Payer: Self-pay

## 2021-11-25 DIAGNOSIS — C50412 Malignant neoplasm of upper-outer quadrant of left female breast: Secondary | ICD-10-CM | POA: Diagnosis not present

## 2021-11-25 DIAGNOSIS — Z170 Estrogen receptor positive status [ER+]: Secondary | ICD-10-CM | POA: Diagnosis not present

## 2021-11-25 LAB — RAD ONC ARIA SESSION SUMMARY
Course Elapsed Days: 59
Plan Fractions Treated to Date: 5
Plan Prescribed Dose Per Fraction: 2 Gy
Plan Total Fractions Prescribed: 5
Plan Total Prescribed Dose: 10 Gy
Reference Point Dosage Given to Date: 60 Gy
Reference Point Session Dosage Given: 2 Gy
Session Number: 30

## 2021-11-25 MED ORDER — ZOLPIDEM TARTRATE 5 MG PO TABS: 10.0000 mg | ORAL_TABLET | Freq: Every evening | ORAL | 0 refills | Status: DC | PRN

## 2021-11-28 ENCOUNTER — Encounter: Payer: Self-pay | Admitting: Internal Medicine

## 2021-11-28 ENCOUNTER — Ambulatory Visit: Payer: Commercial Managed Care - HMO

## 2021-11-28 ENCOUNTER — Other Ambulatory Visit: Payer: Self-pay | Admitting: Radiation Oncology

## 2021-11-28 ENCOUNTER — Encounter: Payer: Medicaid Other | Admitting: Internal Medicine

## 2021-11-28 ENCOUNTER — Other Ambulatory Visit (HOSPITAL_COMMUNITY): Payer: Self-pay

## 2021-11-28 NOTE — Progress Notes (Deleted)
   CC: ***  HPI:  Robin Horn is a 36 y.o.   Past Medical History:  Diagnosis Date   Abnormal Pap smear of cervix    Anemia    Anxiety    Arthritis    Bipolar disorder ([place])    diagnosed years ago   Cancer [facility])    Chlamydia    Depression    Drug use affecting pregnancy, antepartum 05/25/2016   History of cocaine use   Encounter for dental examination 04/12/2021   Family history of breast cancer 01/18/2021   GDM (gestational diabetes mellitus)    GERD (gastroesophageal reflux disease)    pt used to take omeprazole, doesn't have much issues with it anymore   Gonorrhea    Herpes genitalia    History of hiatal hernia    History of postpartum hemorrhage, currently pregnant 05/25/2016   Migraines    Ovarian cyst    Polycystic kidney disease    Positive GBS test 09/23/2016   Preeclampsia    Pregnancy affected by fetal growth restriction 09/20/2016   Baby B. Getting weekly dopplers, AFI.    Pregnancy induced hypertension    Review of Systems:  ***  Physical Exam:  There were no vitals filed for this visit. ***  Assessment & Plan:   See Encounters Tab for problem based charting.  No problem-specific Assessment & Plan notes found for this encounter.  Muscle spasm: Ms. Toney Rakes was initially evaluated for muscle spasm in June 2023 at which point it was noted that she felt her spasms were increasing in frequency with initiation of chemotherapy and radiation for breast cancer.  At that time she was started on Robaxin 750 milligrams with follow-up in 1 month.  Today she states that her symptoms***.  Hypertension: Current management includes lisinopril 10 mg daily.  Today her blood pressure is***.  She does/does not check her blood pressure at home.***   Patient {GC/GE:3044014::"discussed with","seen with"} Dr. {NAMES:3044014::"Guilloud","Hoffman","Mullen","Narendra","Williams","Vincent"}

## 2021-11-29 DIAGNOSIS — Z419 Encounter for procedure for purposes other than remedying health state, unspecified: Secondary | ICD-10-CM | POA: Diagnosis not present

## 2021-11-30 ENCOUNTER — Encounter: Payer: Self-pay | Admitting: Internal Medicine

## 2021-11-30 ENCOUNTER — Encounter: Payer: Self-pay | Admitting: Hematology and Oncology

## 2021-11-30 ENCOUNTER — Ambulatory Visit (INDEPENDENT_AMBULATORY_CARE_PROVIDER_SITE_OTHER): Payer: Medicaid Other | Admitting: Internal Medicine

## 2021-11-30 VITALS — BP 138/88 | HR 85 | Temp 98.4°F | Ht 61.0 in | Wt 154.9 lb

## 2021-11-30 DIAGNOSIS — Z170 Estrogen receptor positive status [ER+]: Secondary | ICD-10-CM

## 2021-11-30 DIAGNOSIS — M62838 Other muscle spasm: Secondary | ICD-10-CM | POA: Diagnosis not present

## 2021-11-30 DIAGNOSIS — I10 Essential (primary) hypertension: Secondary | ICD-10-CM

## 2021-11-30 DIAGNOSIS — F17210 Nicotine dependence, cigarettes, uncomplicated: Secondary | ICD-10-CM | POA: Diagnosis not present

## 2021-11-30 MED ORDER — LISINOPRIL 10 MG PO TABS
10.0000 mg | ORAL_TABLET | Freq: Every day | ORAL | 3 refills | Status: DC
Start: 2021-11-30 — End: 2023-01-10

## 2021-11-30 MED ORDER — METHOCARBAMOL 750 MG PO TABS: 750.0000 mg | ORAL_TABLET | Freq: Four times a day (QID) | ORAL | 0 refills | Status: DC

## 2021-11-30 MED ORDER — TIZANIDINE HCL 4 MG PO TABS: 4.0000 mg | ORAL_TABLET | Freq: Three times a day (TID) | ORAL | 1 refills | Status: DC

## 2021-11-30 NOTE — Assessment & Plan Note (Signed)
Patient reports continued back spasms beginning in the mid thoracic spine and radiating up through her neck.  She denies recent falls or trauma.  She denies upper extremity weakness.  She was recently trialed on tizanidine which helped with the muscle spasms.  She denies any focal point where the pain starts but states that it did start when she began chemotherapy.  She does have a history of scoliosis and back pain/spasms in the past.  She recently finished her last radiation therapy for breast cancer. Assessment: I am not sure if there is a correlation between radiation therapy for her breast cancer and her back spasms however given her history of malignancy I am concerned for increased fragility in her spine. Plan: We will obtain thoracic vertebral x-ray.  Refill for tizanidine sent in at this time.

## 2021-11-30 NOTE — Assessment & Plan Note (Signed)
Blood pressure of 138/88 today.  Currently managed with lisinopril 10 mg daily. Plan: We will have patient follow-up in 2 months for general checkup.  At that time if her blood pressure is trending up we will increase her lisinopril dose.  In the meantime continue lisinopril 10 mg daily.  90-day refill sent in.

## 2021-11-30 NOTE — Progress Notes (Signed)
CC: back pain/spasms  HPI:  [name] is a 36 y.o. female with past medical history as detailed below who presents today with persistent back pain and spasms. Please see problem based charting for detailed assessment and plan.  Past Medical History:  Diagnosis Date   Abnormal Pap smear of cervix    Anemia    Anxiety    Arthritis    Bipolar disorder ([place])    diagnosed years ago   Cancer [facility])    Chlamydia    Depression    Drug use affecting pregnancy, antepartum 05/25/2016   History of cocaine use   Encounter for dental examination 04/12/2021   Family history of breast cancer 01/18/2021   GDM (gestational diabetes mellitus)    GERD (gastroesophageal reflux disease)    pt used to take omeprazole, doesn't have much issues with it anymore   Gonorrhea    Herpes genitalia    History of hiatal hernia    History of postpartum hemorrhage, currently pregnant 05/25/2016   Migraines    Ovarian cyst    Polycystic kidney disease    Positive GBS test 09/23/2016   Preeclampsia    Pregnancy affected by fetal growth restriction 09/20/2016   Baby B. Getting weekly dopplers, AFI.    Pregnancy induced hypertension    Review of Systems:  Negative unless otherwise stated.  Physical Exam:  Vitals:   11/30/21 1435  BP: 138/88  Pulse: 85  Temp: 98.4 F (36.9 C)  TempSrc: Oral  SpO2: 100%  Weight: 154 lb 14.4 oz (70.3 kg)  Height: '5\' 1"'$  (1.549 m)   Constitutional: Patient appears fatigued but is in no acute distress. Cardio: Regular rate and rhythm.  No murmurs, rubs, gallops. Pulm: Clear to auscultation bilaterally. Chest: Scarring from bilateral mastectomy and radiation therapy. MSK: Midline tenderness beginning around T8 spinous process and continuing cranially.  Hypertonicity in the paravertebral muscles beginning around this level as well.  Patient has increased tenderness to palpation over T7-8 spinal processes.  No obvious step-offs or deformities. Skin: Scarring from  radiation over the chest as above.  She does have scarring over her left shoulder as well. Neuro: Awake, alert, oriented x3.  No focal deficit noted. Psych: Pleasant mood and affect.  Assessment & Plan:   See Encounters Tab for problem based charting.  Hypertension Blood pressure of 138/88 today.  Currently managed with lisinopril 10 mg daily. Plan: We will have patient follow-up in 2 months for general checkup.  At that time if her blood pressure is trending up we will increase her lisinopril dose.  In the meantime continue lisinopril 10 mg daily.  90-day refill sent in.  Muscle spasm Patient reports continued back spasms beginning in the mid thoracic spine and radiating up through her neck.  She denies recent falls or trauma.  She denies upper extremity weakness.  She was recently trialed on tizanidine which helped with the muscle spasms.  She denies any focal point where the pain starts but states that it did start when she began chemotherapy.  She does have a history of scoliosis and back pain/spasms in the past.  She recently finished her last radiation therapy for breast cancer. Assessment: I am not sure if there is a correlation between radiation therapy for her breast cancer and her back spasms however given her history of malignancy I am concerned for increased fragility in her spine. Plan: We will obtain thoracic vertebral x-ray.  Refill for tizanidine sent in at this time.  Patient discussed with Dr.  Saverio Danker

## 2021-11-30 NOTE — Patient Instructions (Addendum)
Ms. Mansour,  It was a pleasure to see you today! The past year has been busy and you have been through a lot. I am proud of you for persisting through it all!  I will refill tizanidine for your back spasms today. I am also going to get an x-ray of your back to make sure that your vertebrae are all normal in appearance.  Please follow-up in 2-3 months for a general check up with me. If you need anything before your next appointment, please contact the clinic.  My best, Dr. Marlou Sa

## 2021-12-01 ENCOUNTER — Other Ambulatory Visit (HOSPITAL_COMMUNITY): Payer: Self-pay

## 2021-12-02 ENCOUNTER — Ambulatory Visit (HOSPITAL_COMMUNITY)
Admission: RE | Admit: 2021-12-02 | Discharge: 2021-12-02 | Disposition: A | Discharge status: Home / Self Care | Payer: Commercial Managed Care - HMO | Source: Ambulatory Visit | Attending: Internal Medicine | Admitting: Internal Medicine

## 2021-12-02 DIAGNOSIS — Z170 Estrogen receptor positive status [ER+]: Secondary | ICD-10-CM | POA: Insufficient documentation

## 2021-12-02 DIAGNOSIS — Z7689 Persons encountering health services in other specified circumstances: Secondary | ICD-10-CM | POA: Diagnosis not present

## 2021-12-02 DIAGNOSIS — M546 Pain in thoracic spine: Secondary | ICD-10-CM | POA: Diagnosis not present

## 2021-12-02 DIAGNOSIS — C50412 Malignant neoplasm of upper-outer quadrant of left female breast: Secondary | ICD-10-CM | POA: Insufficient documentation

## 2021-12-02 NOTE — Progress Notes (Signed)
Patient Care Team: [name], DO as PCP - General (Internal Medicine) Claudia Pollock, RN as Registered Nurse Dayna Barker, MD as Consulting Physician (General Surgery) Ethelda Chick as Social Worker Gery Pray, MD as Consulting Physician (Radiation Oncology) [name], MD as Consulting Physician (Hematology and Oncology) Rolm Bookbinder, MD as Consulting Physician (General Surgery)  DIAGNOSIS:  Encounter Diagnosis  Name Primary?   Malignant neoplasm of upper-outer quadrant of left breast in female, estrogen receptor positive ([place])     SUMMARY OF ONCOLOGIC HISTORY: Oncology History  Left breast lump (Resolved)  Malignant neoplasm of upper-outer quadrant of left breast in female, estrogen receptor positive ([place])  12/17/2020 Initial Diagnosis   Palpable lump in the upper outer left breast. 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, Her2+ (3+)/ER+ (80%)/PR- (<1%) in the left breast, and invasive ductal carcinoma, Her2+ (3+)/ER+ (80%)/PR+ (3%) in the left axillary lymph node   12/23/2020 Cancer Staging   Staging form: Breast, AJCC 8th Edition - Clinical stage from 12/23/2020: Stage IB (cT2, cN1, cM0, G3, ER+, PR+, HER2+) - Signed by [name], MD on 12/23/2020 Stage prefix: Initial diagnosis Histologic grading system: 3 grade system   01/11/2021 - 04/29/2021 Chemotherapy   Patient is on Treatment Plan : BREAST  Docetaxel + Carboplatin + Trastuzumab + Pertuzumab  (TCHP) q21d      01/31/2021 Genetic Testing   Negative hereditary cancer genetic testing: no pathogenic variants detected in [facility] Common Hereditary Cancers +RNA Panel.  The report date is January 31, 2021.    The Common Hereditary Cancers + RNA Panel offered by [facility] includes sequencing,  deletion/duplication, and RNA testing of the following 47 genes: APC, ATM, AXIN2, BARD1, BMPR1A, BRCA1, BRCA2, BRIP1, CDH1, CDK4*, CDKN2A (p14ARF)*, CDKN2A (p16INK4a)*, CHEK2, CTNNA1, DICER1, EPCAM (Deletion/duplication testing only), GREM1 (promoter region deletion/duplication testing only), KIT, MEN1, MLH1, MSH2, MSH3, MSH6, MUTYH, NBN, NF1, NHTL1, PALB2, PDGFRA*, PMS2, POLD1, POLE, PTEN, RAD50, RAD51C, RAD51D, SDHB, SDHC, SDHD, SMAD4, SMARCA4. STK11, TP53, TSC1, TSC2, and VHL.  The following genes were evaluated for sequence changes only: SDHA and HOXB13 c.251G>A variant only.  RNA analysis is not performed for the * genes.     05/23/2021 Surgery   Left mastectomy: Negative for residual cancer.  Extensive residual high-grade DCIS, 0/4 lymph nodes negative Right mastectomy: Benign Complete pathologic response   06/08/2021 -  Chemotherapy   Patient is on Treatment Plan : BREAST Trastuzumab  + Pertuzumab q21d x 13 cycles       CHIEF COMPLIANT: Follow-up Herceptin Perjeta  INTERVAL HISTORY: AZENETH CARBONELL is a 36 y.o. with above-mentioned history of left breast cancer, completed chemotherapy with TCHP. She presents to the clinic today for follow-up. Denies diarrhea nausea and fatigue. Overall she is tolerating the Herceptin Perjeta    ALLERGIES:  is allergic to hydrocodone-acetaminophen, tramadol, and lactose intolerance (gi).  MEDICATIONS:  Current Outpatient Medications  Medication Sig Dispense Refill   tamoxifen (NOLVADEX) 20 MG tablet Take 1 tablet (20 mg total) by mouth daily. 90 tablet 3   acetaminophen (TYLENOL) 500 MG tablet Take 1,000 mg by mouth every 6 (six) hours as needed (pain).     lidocaine (XYLOCAINE) 2 % solution Use as directed 15 mLs in the mouth or throat every 3 (three) hours as needed for mouth pain. 100 mL 0   lidocaine-prilocaine (EMLA) cream  Apply 1 application. topically as needed. 30 g 0   lisinopril (ZESTRIL) 10 MG tablet Take 1 tablet (10 mg total) by mouth daily.  90 tablet 3   prochlorperazine (COMPAZINE) 10 MG tablet Take 1 tablet (10 mg total) by mouth every 6 (six) hours as needed for nausea or vomiting. 20 tablet 0   tiZANidine (ZANAFLEX) 4 MG tablet Take 1 tablet (4 mg total) by mouth 3 (three) times daily. 90 tablet 1   zolpidem (AMBIEN) 5 MG tablet Take 2 tablets (10 mg total) by mouth at bedtime as needed. 60 tablet 0   No current facility-administered medications for this visit.   Facility-Administered Medications Ordered in Other Visits  Medication Dose Route Frequency Provider Last Rate Last Admin   heparin lock flush 100 unit/mL  500 Units Intracatheter Once [name], MD       sodium chloride flush (NS) 0.9 % injection 10 mL  10 mL Intracatheter Once [name], MD        PHYSICAL EXAMINATION: ECOG PERFORMANCE STATUS: 1 - Symptomatic but completely ambulatory  Vitals:   12/13/21 1359  BP: (!) 142/77  Pulse: 80  Resp: 18  Temp: (!) 97.3 F (36.3 C)  SpO2: 100%   Filed Weights   12/13/21 1359  Weight: 153 lb 1.6 oz (69.4 kg)      LABORATORY DATA:  I have reviewed the data as listed    Latest Ref Rng & Units 10/31/2021   12:26 PM 10/28/2021   11:21 AM 09/30/2021   12:49 PM  CMP  Glucose 70 - 99 mg/dL 73  88  105   BUN 6 - 20 mg/dL 13  15  15    Creatinine 0.44 - 1.00 mg/dL 1.60  1.32  1.24   Sodium 135 - 145 mmol/L 142  139  140   Potassium 3.5 - 5.1 mmol/L 3.7  3.8  3.6   Chloride 98 - 111 mmol/L 107  104  105   CO2 22 - 32 mmol/L 28  30  30    Calcium 8.9 - 10.3 mg/dL 9.3  9.5  9.2   Total Protein 6.5 - 8.1 g/dL 6.9  6.8  7.2   Total Bilirubin 0.3 - 1.2 mg/dL 0.2  0.2  0.4   Alkaline Phos 38 - 126 U/L 62  65  66   AST 15 - 41 U/L 15  13  13    ALT 0 - 44 U/L 10  8  14      Lab Results  Component Value Date   WBC 5.0 12/13/2021   HGB 10.5 (L) 12/13/2021   HCT 31.6 (L) 12/13/2021   MCV 89.0 12/13/2021   PLT 254 12/13/2021   NEUTROABS 2.9 12/13/2021    ASSESSMENT & PLAN:  Malignant neoplasm of  upper-outer quadrant of left breast in female, estrogen receptor positive ([place]) 12/17/2020: Palpable lump in the upper outer left breast. 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 Neoadjuvant chemotherapy with TCH Perjeta 6 cycles completed 04/26/2021 followed by Herceptin Perjeta maintenance versus Kadcyla maintenance (based on response to neoadjuvant chemo) for 1 year 2. Left mastectomy 05/23/2021: Negative for residual cancer.  Extensive residual high-grade  DCIS, 0/4 lymph nodes negative; Right mastectomy: Benign  Complete pathologic response 3. Followed by adjuvant radiation therapy  4.  Followed by adjuvant antiestrogen therapy -------------------------------------------------------------------------------------------------------------------------------------------------- Current treatment: Herceptin Perjeta maintenance to be completed 01/03/2022  adverse effects: None  Return to clinic on 01/03/2022 to complete her final treatment of Herceptin and Perjeta.  After that port can be removed. Because she is ER positive, we recommended   tamoxifen therapy.  We discussed the risks and benefits of tamoxifen. These include but not limited to insomnia, hot flashes, mood changes, vaginal dryness, and weight gain. Although rare, serious side effects including endometrial cancer, risk of blood clots were also discussed. We strongly believe that the benefits far outweigh the risks. Patient understands these risks and consented to starting treatment. Planned treatment duration is 10 years.    I sent a message to Dr.  Donne Hazel to remove the port after the last Herceptin    No orders of the defined types were placed in this encounter.  The patient has a good understanding of the overall plan. she agrees with it. she will call with any problems that may develop before the next visit here. Total time spent: 30 mins including face to face time and time spent for planning, charting and co-ordination of care   Harriette Ohara, MD 12/13/21    I Gardiner Coins am scribing for Dr. Lindi Adie  I have reviewed the above documentation for accuracy and completeness, and I agree with the above.

## 2021-12-04 DIAGNOSIS — Z7689 Persons encountering health services in other specified circumstances: Secondary | ICD-10-CM | POA: Diagnosis not present

## 2021-12-05 ENCOUNTER — Other Ambulatory Visit: Payer: Self-pay

## 2021-12-05 ENCOUNTER — Telehealth: Payer: Self-pay | Admitting: Internal Medicine

## 2021-12-05 DIAGNOSIS — Z7689 Persons encountering health services in other specified circumstances: Secondary | ICD-10-CM | POA: Diagnosis not present

## 2021-12-05 NOTE — Patient Instructions (Signed)
Visit Information  Robin Horn  - as a part of your Medicaid benefit, you are eligible for care management and care coordination services at no cost or copay. I was unable to reach you by phone today but would be happy to help you with your health related needs. Please feel free to call me @ [phone]).   A member of the Managed Medicaid care management team will reach out to you again over the next 30 days.   Mickel Fuchs, BSW, [place] Managed Medicaid Team  [phone]

## 2021-12-05 NOTE — Patient Outreach (Signed)
Care Coordination  12/05/2021  [name] [facility] Jun 21, 1985 [id number]   Medicaid Managed Care   Unsuccessful Outreach Note  12/05/2021 Name: Robin Horn MRN: [id number] DOB: 30-Apr-1986  Referred by: [name], DO Reason for referral : High Risk Managed Medicaid (MM Social work telephone outreach)   An unsuccessful telephone outreach was attempted today. The patient was referred to the case management team for assistance with care management and care coordination.   Follow Up Plan: The care management team will reach out to the patient again over the next 30 days.   Mickel Fuchs, BSW, [place] Managed Medicaid Team  [phone]

## 2021-12-05 NOTE — Telephone Encounter (Signed)
Call placed to patient to review results of recent x-ray but there was no answer and no identifiers on VM recording. I will send a MyChart message about these results to her. There were no abnormalities noted on the x-ray.  [name], DO

## 2021-12-06 DIAGNOSIS — Z7689 Persons encountering health services in other specified circumstances: Secondary | ICD-10-CM | POA: Diagnosis not present

## 2021-12-08 DIAGNOSIS — Z7689 Persons encountering health services in other specified circumstances: Secondary | ICD-10-CM | POA: Diagnosis not present

## 2021-12-09 DIAGNOSIS — Z7689 Persons encountering health services in other specified circumstances: Secondary | ICD-10-CM | POA: Diagnosis not present

## 2021-12-10 DIAGNOSIS — Z7689 Persons encountering health services in other specified circumstances: Secondary | ICD-10-CM | POA: Diagnosis not present

## 2021-12-11 DIAGNOSIS — Z7689 Persons encountering health services in other specified circumstances: Secondary | ICD-10-CM | POA: Diagnosis not present

## 2021-12-12 ENCOUNTER — Other Ambulatory Visit: Payer: Self-pay

## 2021-12-12 DIAGNOSIS — Z7689 Persons encountering health services in other specified circumstances: Secondary | ICD-10-CM | POA: Diagnosis not present

## 2021-12-12 NOTE — Progress Notes (Signed)
Internal Medicine Clinic Attending ? ?Case discussed with Dr. Dean  At the time of the visit.  We reviewed the resident?s history and exam and pertinent patient test results.  I agree with the assessment, diagnosis, and plan of care documented in the resident?s note.  ?

## 2021-12-13 ENCOUNTER — Inpatient Hospital Stay: Payer: Medicaid Other

## 2021-12-13 ENCOUNTER — Inpatient Hospital Stay (HOSPITAL_BASED_OUTPATIENT_CLINIC_OR_DEPARTMENT_OTHER): Payer: Medicaid Other | Admitting: Hematology and Oncology

## 2021-12-13 ENCOUNTER — Other Ambulatory Visit: Payer: Self-pay

## 2021-12-13 ENCOUNTER — Inpatient Hospital Stay: Payer: Medicaid Other | Attending: Hematology and Oncology

## 2021-12-13 VITALS — BP 134/84 | HR 72 | Resp 16

## 2021-12-13 DIAGNOSIS — Z79899 Other long term (current) drug therapy: Secondary | ICD-10-CM | POA: Insufficient documentation

## 2021-12-13 DIAGNOSIS — Z5112 Encounter for antineoplastic immunotherapy: Secondary | ICD-10-CM | POA: Diagnosis not present

## 2021-12-13 DIAGNOSIS — Z7689 Persons encountering health services in other specified circumstances: Secondary | ICD-10-CM | POA: Diagnosis not present

## 2021-12-13 DIAGNOSIS — Z170 Estrogen receptor positive status [ER+]: Secondary | ICD-10-CM

## 2021-12-13 DIAGNOSIS — C50412 Malignant neoplasm of upper-outer quadrant of left female breast: Secondary | ICD-10-CM | POA: Insufficient documentation

## 2021-12-13 DIAGNOSIS — Z95828 Presence of other vascular implants and grafts: Secondary | ICD-10-CM

## 2021-12-13 LAB — CMP (CANCER CENTER ONLY)
ALT: 7 U/L (ref 0–44)
AST: 11 U/L — ABNORMAL LOW (ref 15–41)
Albumin: 3.9 g/dL (ref 3.5–5.0)
Alkaline Phosphatase: 68 U/L (ref 38–126)
Anion gap: 6 (ref 5–15)
BUN: 15 mg/dL (ref 6–20)
CO2: 26 mmol/L (ref 22–32)
Calcium: 8.9 mg/dL (ref 8.9–10.3)
Chloride: 109 mmol/L (ref 98–111)
Creatinine: 1.63 mg/dL — ABNORMAL HIGH (ref 0.44–1.00)
GFR, Estimated: 42 mL/min — ABNORMAL LOW (ref >60)
Glucose, Bld: 100 mg/dL — ABNORMAL HIGH (ref 70–99)
Potassium: 3.6 mmol/L (ref 3.5–5.1)
Sodium: 141 mmol/L (ref 135–145)
Total Bilirubin: 0.2 mg/dL — ABNORMAL LOW (ref 0.3–1.2)
Total Protein: 6.7 g/dL (ref 6.5–8.1)

## 2021-12-13 LAB — CBC WITH DIFFERENTIAL (CANCER CENTER ONLY)
Abs Immature Granulocytes: 0 10*3/uL (ref 0.00–0.07)
Basophils Absolute: 0 10*3/uL (ref 0.0–0.1)
Basophils Relative: 0 %
Eosinophils Absolute: 0.4 10*3/uL (ref 0.0–0.5)
Eosinophils Relative: 9 %
HCT: 31.6 % — ABNORMAL LOW (ref 36.0–46.0)
Hemoglobin: 10.5 g/dL — ABNORMAL LOW (ref 12.0–15.0)
Immature Granulocytes: 0 %
Lymphocytes Relative: 25 %
Lymphs Abs: 1.2 10*3/uL (ref 0.7–4.0)
MCH: 29.6 pg (ref 26.0–34.0)
MCHC: 33.2 g/dL (ref 30.0–36.0)
MCV: 89 fL (ref 80.0–100.0)
Monocytes Absolute: 0.3 10*3/uL (ref 0.1–1.0)
Monocytes Relative: 7 %
Neutro Abs: 2.9 10*3/uL (ref 1.7–7.7)
Neutrophils Relative %: 59 %
Platelet Count: 254 10*3/uL (ref 150–400)
RBC: 3.55 MIL/uL — ABNORMAL LOW (ref 3.87–5.11)
RDW: 14.6 % (ref 11.5–15.5)
WBC Count: 5 10*3/uL (ref 4.0–10.5)
nRBC: 0 % (ref 0.0–0.2)

## 2021-12-13 MED ORDER — SODIUM CHLORIDE 0.9 % IV SOLN
420.0000 mg | Freq: Once | INTRAVENOUS | Status: AC
  Administered 2021-12-13: 420 mg via INTRAVENOUS
  Filled 2021-12-13: qty 14

## 2021-12-13 MED ORDER — DIPHENHYDRAMINE HCL 25 MG PO CAPS
25.0000 mg | ORAL_CAPSULE | Freq: Once | ORAL | Status: AC
  Administered 2021-12-13: 25 mg via ORAL

## 2021-12-13 MED ORDER — TRASTUZUMAB-DKST CHEMO 150 MG IV SOLR
6.0000 mg/kg | Freq: Once | INTRAVENOUS | Status: AC
  Administered 2021-12-13: 399 mg via INTRAVENOUS
  Filled 2021-12-13: qty 19

## 2021-12-13 MED ORDER — SODIUM CHLORIDE 0.9% FLUSH
10.0000 mL | INTRAVENOUS | Status: DC | PRN
  Administered 2021-12-13: 10 mL

## 2021-12-13 MED ORDER — HEPARIN SOD (PORK) LOCK FLUSH 100 UNIT/ML IV SOLN
500.0000 [IU] | Freq: Once | INTRAVENOUS | Status: AC | PRN
  Administered 2021-12-13: 500 [IU]

## 2021-12-13 MED ORDER — TAMOXIFEN CITRATE 20 MG PO TABS: 20.0000 mg | ORAL_TABLET | Freq: Every day | ORAL | 3 refills | Status: DC

## 2021-12-13 MED ORDER — ACETAMINOPHEN 325 MG PO TABS
650.0000 mg | ORAL_TABLET | Freq: Once | ORAL | Status: AC
  Administered 2021-12-13: 650 mg via ORAL

## 2021-12-13 MED ORDER — ACETAMINOPHEN 325 MG PO TABS
ORAL_TABLET | ORAL | Status: AC
  Filled 2021-12-13: qty 2

## 2021-12-13 MED ORDER — SODIUM CHLORIDE 0.9 % IV SOLN: Freq: Once | INTRAVENOUS | Status: AC

## 2021-12-13 MED ORDER — DIPHENHYDRAMINE HCL 25 MG PO CAPS
ORAL_CAPSULE | ORAL | Status: AC
  Filled 2021-12-13: qty 1

## 2021-12-13 MED ORDER — SODIUM CHLORIDE 0.9% FLUSH
10.0000 mL | Freq: Once | INTRAVENOUS | Status: AC
  Administered 2021-12-13: 10 mL

## 2021-12-13 NOTE — Patient Instructions (Signed)
[facility] ONCOLOGY  Discharge Instructions: Thank you for choosing [facility] to provide your oncology and hematology care.   If you have a lab appointment with the [facility], please go directly to the [facility] and check in at the registration area.   Wear comfortable clothing and clothing appropriate for easy access to any Portacath or PICC line.   We strive to give you quality time with your provider. You may need to reschedule your appointment if you arrive late (15 or more minutes).  Arriving late affects you and other patients whose appointments are after yours.  Also, if you miss three or more appointments without notifying the office, you may be dismissed from the clinic at the provider's discretion.      For prescription refill requests, have your pharmacy contact our office and allow 72 hours for refills to be completed.    Today you received the following chemotherapy and/or immunotherapy agents: ogivri, perjeta      To help prevent nausea and vomiting after your treatment, we encourage you to take your nausea medication as directed.  BELOW ARE SYMPTOMS THAT SHOULD BE REPORTED IMMEDIATELY: *FEVER GREATER THAN 100.4 F (38 C) OR HIGHER *CHILLS OR SWEATING *NAUSEA AND VOMITING THAT IS NOT CONTROLLED WITH YOUR NAUSEA MEDICATION *UNUSUAL SHORTNESS OF BREATH *UNUSUAL BRUISING OR BLEEDING *URINARY PROBLEMS (pain or burning when urinating, or frequent urination) *BOWEL PROBLEMS (unusual diarrhea, constipation, pain near the anus) TENDERNESS IN MOUTH AND THROAT WITH OR WITHOUT PRESENCE OF ULCERS (sore throat, sores in mouth, or a toothache) UNUSUAL RASH, SWELLING OR PAIN  UNUSUAL VAGINAL DISCHARGE OR ITCHING   Items with * indicate a potential emergency and should be followed up as soon as possible or go to the Emergency Department if any problems should occur.  Please show the CHEMOTHERAPY ALERT CARD or IMMUNOTHERAPY ALERT CARD at  check-in to the Emergency Department and triage nurse.  Should you have questions after your visit or need to cancel or reschedule your appointment, please contact [facility]  Dept: [phone]  and follow the prompts.  Office hours are 8:00 a.m. to 4:30 p.m. Monday - Friday. Please note that voicemails left after 4:00 p.m. may not be returned until the following business day.  We are closed weekends and major holidays. You have access to a nurse at all times for urgent questions. Please call the main number to the clinic Dept: [phone] and follow the prompts.   For any non-urgent questions, you may also contact your provider using MyChart. We now offer e-Visits for anyone 63 and older to request care online for non-urgent symptoms. For details visit [email or web address].   Also download the MyChart app! Go to the app store, search "MyChart", open the app, select [facility], and log in with your MyChart username and password.  Masks are optional in the cancer centers. If you would like for your care team to wear a mask while they are taking care of you, please let them know. You may have one support person who is at least 35 years old accompany you for your appointments.

## 2021-12-13 NOTE — Assessment & Plan Note (Signed)
12/17/2020:Palpable lump in the upper outer left breast.Diagnostic mammogram and US on08/17/22showed multiple suspicious masses, 2abnormal lymph nodes with increased cortical thickness,1 lymph node with borderline abnormal cortical thickness,Biopsy on08/19/22showed invasive ductal carcinoma and DCIS, Her2+ (3+)/ER+ (80%)/PR- (<1%) in the left breast, and invasive ductal carcinoma, Her2+ (3+)/ER+ (80%)/PR+ (3%) in the left axillary lymph node T1CN1A stage Ib  Treatment plan: 1. Neoadjuvant chemotherapy with TCH Perjeta 6 cycles completed 04/26/2021 followed by Herceptin Perjeta maintenance versus Kadcyla maintenance (based on response to neoadjuvant chemo) for 1 year 2.Left mastectomy 05/23/2021: Negative for residual cancer. Extensive residual high-grade DCIS, 0/4 lymph nodes negative;Right mastectomy: Benign  Complete pathologic response 3. Followed by adjuvant radiation therapy 4.Followed by adjuvant antiestrogen therapy -------------------------------------------------------------------------------------------------------------------------------------------------- Current treatment:Herceptin Perjeta maintenanceto be completed 01/03/2022  adverse effects: None  Return to clinic on 01/03/2022 to complete her final treatment of Herceptin and Perjeta.  After that port can be removed. Because she is here positive recommend starting her on tamoxifen therapy. We also discussed the role of neratinib.   I sent a message to Dr. Kinard and Dr. Pace to discuss her reconstruction approach. 

## 2021-12-14 DIAGNOSIS — Z7689 Persons encountering health services in other specified circumstances: Secondary | ICD-10-CM | POA: Diagnosis not present

## 2021-12-15 DIAGNOSIS — Z7689 Persons encountering health services in other specified circumstances: Secondary | ICD-10-CM | POA: Diagnosis not present

## 2021-12-16 DIAGNOSIS — Z7689 Persons encountering health services in other specified circumstances: Secondary | ICD-10-CM | POA: Diagnosis not present

## 2021-12-17 DIAGNOSIS — Z7689 Persons encountering health services in other specified circumstances: Secondary | ICD-10-CM | POA: Diagnosis not present

## 2021-12-18 DIAGNOSIS — Z7689 Persons encountering health services in other specified circumstances: Secondary | ICD-10-CM | POA: Diagnosis not present

## 2021-12-19 DIAGNOSIS — Z7689 Persons encountering health services in other specified circumstances: Secondary | ICD-10-CM | POA: Diagnosis not present

## 2021-12-20 DIAGNOSIS — Z7689 Persons encountering health services in other specified circumstances: Secondary | ICD-10-CM | POA: Diagnosis not present

## 2021-12-21 ENCOUNTER — Encounter: Payer: Self-pay | Admitting: Radiation Oncology

## 2021-12-21 DIAGNOSIS — Z7689 Persons encountering health services in other specified circumstances: Secondary | ICD-10-CM | POA: Diagnosis not present

## 2021-12-22 DIAGNOSIS — Z7689 Persons encountering health services in other specified circumstances: Secondary | ICD-10-CM | POA: Diagnosis not present

## 2021-12-23 ENCOUNTER — Telehealth: Payer: Self-pay | Admitting: Licensed Clinical Social Worker

## 2021-12-23 DIAGNOSIS — Z7689 Persons encountering health services in other specified circumstances: Secondary | ICD-10-CM | POA: Diagnosis not present

## 2021-12-23 NOTE — Telephone Encounter (Signed)
Spoke with patient to invite to next [facility] night event. Pt is interested in attending. Per IT trainer, family also needs various resources (school supplies, counseling for kids, support for pt in transition to survivorship). CSW sent via email to pt and will provide hard copies at [facility] event.   Sherif Millspaugh E Lyrique Hakim, LCSW

## 2021-12-24 DIAGNOSIS — Z7689 Persons encountering health services in other specified circumstances: Secondary | ICD-10-CM | POA: Diagnosis not present

## 2021-12-25 DIAGNOSIS — Z7689 Persons encountering health services in other specified circumstances: Secondary | ICD-10-CM | POA: Diagnosis not present

## 2021-12-25 NOTE — Progress Notes (Incomplete)
  Radiation Oncology         [phone] ________________________________  Patient Name: Robin Horn MRN: [id number] DOB: [birth year] Referring Physician: [name] (Profile Not Attached) Date of Service: 11/25/2021 [facility] [place]                                                        End Of Treatment Note  Diagnoses: C50.412-Malignant neoplasm of upper-outer quadrant of left female breast Z17.0-Estrogen receptor positive status [ER+]  Cancer Staging: S/p chemotherapy and bilateral mastectomies: extensive residual high-grade ductal carcinoma in situ and no residual invasive disease in the left breast   Left Breast UOQ, 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, ER+ / PR- / Her2+, Grade 3   Cancer Staging  Malignant neoplasm of upper-outer quadrant of left breast in female, estrogen receptor positive ([name]) Staging form: Breast, AJCC 8th Edition - Clinical stage from 12/23/2020: Stage IB (cT2, cN1, cM0, G3, ER+, PR+, HER2+) - Signed by [name], MD on 12/23/2020  Intent: Curative  Radiation Treatment Dates: 09/27/2021 through 11/25/2021 Site Technique Total Dose (Gy) Dose per Fx (Gy) Completed Fx Beam Energies  Chest Wall, Left: CW_L 3D 50/50 2 25/25 6X, 10X  Sclav-LT: SCV_L 3D 50/50 2 25/25 6X, 10X  Chest Wall, Left: CW_L_Bst Electron 10/10 2 5/5 6E   Narrative: The patient tolerated radiation therapy relatively well. During her final weekly treatment check on 11/24/21, the patient reported fatigue which would keep her up at night. Physical exam performed on that same date showed significant hyperpigmentation changes to the left chest area without any skin breakdown.  Plan: The patient will follow-up with radiation oncology in one month .  ________________________________________________ -----------------------------------  Blair Promise, PhD, MD  This document serves as a record of services personally performed by Gery Pray, MD. It  was created on his behalf by Roney Mans, a trained medical scribe. The creation of this record is based on the scribe's personal observations and the provider's statements to them. This document has been checked and approved by the attending provider.

## 2021-12-26 ENCOUNTER — Ambulatory Visit
Admission: RE | Admit: 2021-12-26 | Discharge: 2021-12-26 | Disposition: A | Discharge status: Home / Self Care | Payer: Medicaid Other | Source: Ambulatory Visit | Attending: Radiation Oncology | Admitting: Radiation Oncology

## 2021-12-26 ENCOUNTER — Telehealth: Payer: Self-pay | Admitting: *Deleted

## 2021-12-26 ENCOUNTER — Inpatient Hospital Stay: Payer: Medicaid Other

## 2021-12-26 DIAGNOSIS — Z7689 Persons encountering health services in other specified circumstances: Secondary | ICD-10-CM | POA: Diagnosis not present

## 2021-12-26 HISTORY — DX: Personal history of irradiation: Z92.3

## 2021-12-26 NOTE — Telephone Encounter (Signed)
RETURNED PATIENT'S PHONE CALL, SPOKE WITH PATIENT. ?

## 2021-12-26 NOTE — Progress Notes (Signed)
Nutrition  Patient did not show up for scheduled nutrition appointment for today.  Message sent to scheduling to offer another appointment.  Tamel Abel B. Zenia Resides, [place], [place] Registered Dietitian [phone]

## 2021-12-27 DIAGNOSIS — Z7689 Persons encountering health services in other specified circumstances: Secondary | ICD-10-CM | POA: Diagnosis not present

## 2021-12-28 ENCOUNTER — Other Ambulatory Visit: Payer: Self-pay | Admitting: Hematology and Oncology

## 2021-12-28 ENCOUNTER — Other Ambulatory Visit: Payer: Self-pay

## 2021-12-28 ENCOUNTER — Other Ambulatory Visit: Payer: Self-pay | Admitting: Physician Assistant

## 2021-12-28 DIAGNOSIS — Z7689 Persons encountering health services in other specified circumstances: Secondary | ICD-10-CM | POA: Diagnosis not present

## 2021-12-28 NOTE — Progress Notes (Signed)
Radiation Oncology         [phone] ________________________________  Name: Robin Horn MRN: [id number]  Date: 12/29/2021  DOB: March 21, 1986  Follow-Up Visit Note  CC: [name], DO  [name], MD  No diagnosis found.  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, ER+ / PR- / Her2+, Grade 3   Cancer Staging  Malignant neoplasm of upper-outer quadrant of left breast in female, estrogen receptor positive ([place]) Staging form: Breast, AJCC 8th Edition - Clinical stage from 12/23/2020: Stage IB (cT2, cN1, cM0, G3, ER+, PR+, HER2+) - Signed by [name], MD on 12/23/2020  Interval Since Last Radiation: 1 month and 3 days   Intent: Curative  Radiation Treatment Dates: 09/27/2021 through 11/25/2021 Site Technique Total Dose (Gy) Dose per Fx (Gy) Completed Fx Beam Energies  Chest Wall, Left: CW_L 3D 50/50 2 25/25 6X, 10X  Sclav-LT: SCV_L 3D 50/50 2 25/25 6X, 10X  Chest Wall, Left: CW_L_Bst Electron 10/10 2 5/5 6E    Narrative:  The patient returns today for routine follow-up.  The patient tolerated radiation therapy relatively well. During her final weekly treatment check on 11/24/21, the patient reported fatigue which would keep her up at night. Physical exam performed on that same date showed significant hyperpigmentation changes to the left chest area without any skin breakdown.   Since her initial consultation date of 09/14/21, the patient had continued on with maintenance Herceptin Perjeta under Dr. Lindi Adie. The patient is scheduled to complete her final maintenance treatment on 01/03/22.  During her most recent follow up visit with Dr. Lindi Adie on 12/13/21, the patient was noted to be tolerating treatment well without any concerning side effects. Given her HER2 positive status, Dr. Lindi Adie has  also recommended tamoxifen therapy and neratinib following maintenance therapy.   Of note: x-ray of the thoracic spine on 12/02/21 for evaluation of upper back pain showed no evidence of acute intracranial abnormality.          ***                      Allergies:  is allergic to hydrocodone-acetaminophen, tramadol, and lactose intolerance (gi).  Meds: Current Outpatient Medications  Medication Sig Dispense Refill   acetaminophen (TYLENOL) 500 MG tablet Take 1,000 mg by mouth every 6 (six) hours as needed (pain).     lidocaine (XYLOCAINE) 2 % solution Use as directed 15 mLs in the mouth or throat every 3 (three) hours as needed for mouth pain. 100 mL 0   lidocaine-prilocaine (EMLA) cream Apply 1 application. topically as needed. 30 g 0   lisinopril (ZESTRIL) 10 MG tablet Take 1 tablet (10 mg total) by mouth daily. 90 tablet 3   prochlorperazine (COMPAZINE) 10 MG tablet Take 1 tablet (10 mg total) by mouth every 6 (six) hours as needed for nausea or vomiting. 20 tablet 0   tamoxifen (NOLVADEX) 20 MG tablet Take 1 tablet (20 mg total) by mouth daily. 90 tablet 3   tiZANidine (ZANAFLEX) 4 MG tablet Take 1 tablet (4 mg total) by mouth 3 (three) times daily. 90 tablet 1   zolpidem (AMBIEN) 5 MG tablet TAKE 2 TABLETS BY MOUTH AT BEDTIME AS NEEDED 60 tablet 0   No current facility-administered medications for this encounter.   Facility-Administered Medications Ordered in Other Encounters  Medication  Dose Route Frequency Provider Last Rate Last Admin   heparin lock flush 100 unit/mL  500 Units Intracatheter Once [name], MD       sodium chloride flush (NS) 0.9 % injection 10 mL  10 mL Intracatheter Once [name], MD        Physical Findings: The patient is in no acute distress. Patient is alert and oriented.  vitals were not taken for this visit. .  No significant changes. Lungs are clear to auscultation bilaterally. Heart has regular rate and rhythm. No palpable cervical,  supraclavicular, or axillary adenopathy. Abdomen soft, non-tender, normal bowel sounds.  Left Breast: ***  Right Breast: ***  Lab Findings: Lab Results  Component Value Date   WBC 5.0 12/13/2021   HGB 10.5 (L) 12/13/2021   HCT 31.6 (L) 12/13/2021   MCV 89.0 12/13/2021   PLT 254 12/13/2021    Radiographic Findings: DG Thoracic Spine W/Swimmers  Result Date: 12/02/2021 CLINICAL DATA:  Upper back pain, no known injury, history of breast carcinoma, initial encounter EXAM: THORACIC SPINE - 3 VIEWS COMPARISON:  None Available. FINDINGS: Pedicles are within normal limits. No compression deformity is seen. No paraspinal mass is noted. Mild osteophytic changes are seen. Right chest wall port is noted. IMPRESSION: No acute abnormality noted. Electronically Signed   By: Inez Catalina M.D.   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  ***  Plan:  ***   *** minutes of total time was spent for this patient encounter, including preparation, face-to-face counseling with the patient and coordination of care, physical exam, and documentation of the encounter. ____________________________________  Blair Promise, PhD, MD  This document serves as a record of services personally performed by Gery Pray, MD. It was created on his behalf by Roney Mans, a trained medical scribe. The creation of this record is based on the scribe's personal observations and the provider's statements to them. This document has been checked and approved by the attending provider.

## 2021-12-29 ENCOUNTER — Encounter: Payer: Self-pay | Admitting: Radiation Oncology

## 2021-12-29 ENCOUNTER — Ambulatory Visit
Admission: RE | Admit: 2021-12-29 | Discharge: 2021-12-29 | Disposition: A | Discharge status: Home / Self Care | Payer: Medicaid Other | Source: Ambulatory Visit | Attending: Radiation Oncology | Admitting: Radiation Oncology

## 2021-12-29 DIAGNOSIS — Z79899 Other long term (current) drug therapy: Secondary | ICD-10-CM | POA: Diagnosis not present

## 2021-12-29 DIAGNOSIS — Z170 Estrogen receptor positive status [ER+]: Secondary | ICD-10-CM | POA: Diagnosis not present

## 2021-12-29 DIAGNOSIS — Z923 Personal history of irradiation: Secondary | ICD-10-CM | POA: Insufficient documentation

## 2021-12-29 DIAGNOSIS — C50412 Malignant neoplasm of upper-outer quadrant of left female breast: Secondary | ICD-10-CM | POA: Insufficient documentation

## 2021-12-29 DIAGNOSIS — Z7689 Persons encountering health services in other specified circumstances: Secondary | ICD-10-CM | POA: Diagnosis not present

## 2021-12-29 NOTE — Progress Notes (Signed)
Robin Horn is here today for follow up post radiation to the breast.   Breast Side: Left chest wall.   They completed their radiation on: 11/25/21  Does the patient complain of any of the following: Post radiation skin issues: Patient states skin has improved.  Breast Tenderness: No Breast Swelling: No Lymphadema: No Range of Motion limitations: No Fatigue post radiation: Continues to have a low energy level.  Appetite good/fair/poor:Good  Additional comments if applicable:   BP (!) 361/44 (BP Location: Left Arm, Patient Position: Sitting, Cuff Size: Normal)   Pulse 77   Temp 98.8 F (37.1 C) (Oral)   Resp 18   Ht '5\' 2"'$  (1.575 m)   Wt 156 lb (70.8 kg)   SpO2 100%   BMI 28.53 kg/m

## 2021-12-30 ENCOUNTER — Encounter: Payer: Self-pay | Admitting: Hematology and Oncology

## 2021-12-31 ENCOUNTER — Other Ambulatory Visit: Payer: Self-pay | Admitting: Hematology and Oncology

## 2021-12-31 DIAGNOSIS — Z170 Estrogen receptor positive status [ER+]: Secondary | ICD-10-CM

## 2022-01-03 ENCOUNTER — Institutional Professional Consult (permissible substitution): Payer: Medicaid Other | Admitting: Plastic Surgery

## 2022-01-03 ENCOUNTER — Inpatient Hospital Stay: Payer: Medicaid Other

## 2022-01-03 MED ORDER — LIDOCAINE-PRILOCAINE 2.5-2.5 % EX CREA: 1.0000 | TOPICAL_CREAM | CUTANEOUS | 0 refills | Status: DC | PRN

## 2022-01-04 ENCOUNTER — Telehealth: Payer: Self-pay

## 2022-01-04 NOTE — Telephone Encounter (Signed)
Notified Patient of prior authorization approval for Zolpidem '5mg'$  Tablets quantity of 60. PA # [id number]. No other needs or concerns voiced at this time.

## 2022-01-06 ENCOUNTER — Other Ambulatory Visit (HOSPITAL_COMMUNITY): Payer: Self-pay

## 2022-01-06 ENCOUNTER — Encounter: Payer: Self-pay | Admitting: Hematology and Oncology

## 2022-01-18 DIAGNOSIS — Z9011 Acquired absence of right breast and nipple: Secondary | ICD-10-CM | POA: Diagnosis not present

## 2022-01-18 DIAGNOSIS — C50912 Malignant neoplasm of unspecified site of left female breast: Secondary | ICD-10-CM | POA: Diagnosis not present

## 2022-01-19 ENCOUNTER — Encounter: Payer: Self-pay | Admitting: Hematology and Oncology

## 2022-01-23 ENCOUNTER — Telehealth: Payer: Self-pay | Admitting: Hematology and Oncology

## 2022-01-23 NOTE — Telephone Encounter (Signed)
Scheduled appointments per WQ. Patient is aware. 

## 2022-01-24 ENCOUNTER — Encounter: Payer: Self-pay | Admitting: *Deleted

## 2022-01-24 ENCOUNTER — Other Ambulatory Visit: Payer: Self-pay

## 2022-01-24 ENCOUNTER — Inpatient Hospital Stay: Payer: Medicaid Other

## 2022-01-24 ENCOUNTER — Inpatient Hospital Stay: Payer: Medicaid Other | Admitting: Nutrition

## 2022-01-24 ENCOUNTER — Inpatient Hospital Stay: Payer: Medicaid Other | Attending: Hematology and Oncology | Admitting: Adult Health

## 2022-01-24 ENCOUNTER — Encounter: Payer: Self-pay | Admitting: Adult Health

## 2022-01-24 VITALS — BP 139/90 | HR 92 | Temp 97.9°F | Resp 18 | Ht 62.0 in | Wt 154.8 lb

## 2022-01-24 DIAGNOSIS — C50412 Malignant neoplasm of upper-outer quadrant of left female breast: Secondary | ICD-10-CM | POA: Diagnosis not present

## 2022-01-24 DIAGNOSIS — Z5112 Encounter for antineoplastic immunotherapy: Secondary | ICD-10-CM | POA: Insufficient documentation

## 2022-01-24 DIAGNOSIS — Z170 Estrogen receptor positive status [ER+]: Secondary | ICD-10-CM

## 2022-01-24 DIAGNOSIS — Z79899 Other long term (current) drug therapy: Secondary | ICD-10-CM | POA: Insufficient documentation

## 2022-01-24 DIAGNOSIS — Z95828 Presence of other vascular implants and grafts: Secondary | ICD-10-CM

## 2022-01-24 LAB — CBC WITH DIFFERENTIAL (CANCER CENTER ONLY)
Abs Immature Granulocytes: 0.02 10*3/uL (ref 0.00–0.07)
Basophils Absolute: 0 10*3/uL (ref 0.0–0.1)
Basophils Relative: 0 %
Eosinophils Absolute: 0.5 10*3/uL (ref 0.0–0.5)
Eosinophils Relative: 7 %
HCT: 32.7 % — ABNORMAL LOW (ref 36.0–46.0)
Hemoglobin: 10.8 g/dL — ABNORMAL LOW (ref 12.0–15.0)
Immature Granulocytes: 0 %
Lymphocytes Relative: 22 %
Lymphs Abs: 1.5 10*3/uL (ref 0.7–4.0)
MCH: 29.3 pg (ref 26.0–34.0)
MCHC: 33 g/dL (ref 30.0–36.0)
MCV: 88.6 fL (ref 80.0–100.0)
Monocytes Absolute: 0.4 10*3/uL (ref 0.1–1.0)
Monocytes Relative: 6 %
Neutro Abs: 4.3 10*3/uL (ref 1.7–7.7)
Neutrophils Relative %: 65 %
Platelet Count: 307 10*3/uL (ref 150–400)
RBC: 3.69 MIL/uL — ABNORMAL LOW (ref 3.87–5.11)
RDW: 14 % (ref 11.5–15.5)
WBC Count: 6.7 10*3/uL (ref 4.0–10.5)
nRBC: 0 % (ref 0.0–0.2)

## 2022-01-24 LAB — CMP (CANCER CENTER ONLY)
ALT: 8 U/L (ref 0–44)
AST: 12 U/L — ABNORMAL LOW (ref 15–41)
Albumin: 3.9 g/dL (ref 3.5–5.0)
Alkaline Phosphatase: 47 U/L (ref 38–126)
Anion gap: 4 — ABNORMAL LOW (ref 5–15)
BUN: 13 mg/dL (ref 6–20)
CO2: 31 mmol/L (ref 22–32)
Calcium: 9 mg/dL (ref 8.9–10.3)
Chloride: 104 mmol/L (ref 98–111)
Creatinine: 1.42 mg/dL — ABNORMAL HIGH (ref 0.44–1.00)
GFR, Estimated: 49 mL/min — ABNORMAL LOW (ref >60)
Glucose, Bld: 88 mg/dL (ref 70–99)
Potassium: 3.9 mmol/L (ref 3.5–5.1)
Sodium: 139 mmol/L (ref 135–145)
Total Bilirubin: 0.3 mg/dL (ref 0.3–1.2)
Total Protein: 6.9 g/dL (ref 6.5–8.1)

## 2022-01-24 MED ORDER — DIPHENHYDRAMINE HCL 25 MG PO CAPS: 50.0000 mg | ORAL_CAPSULE | Freq: Once | ORAL | Status: DC

## 2022-01-24 MED ORDER — SODIUM CHLORIDE 0.9 % IV SOLN: Freq: Once | INTRAVENOUS | Status: AC

## 2022-01-24 MED ORDER — HEPARIN SOD (PORK) LOCK FLUSH 100 UNIT/ML IV SOLN
500.0000 [IU] | Freq: Once | INTRAVENOUS | Status: AC | PRN
  Administered 2022-01-24: 500 [IU]

## 2022-01-24 MED ORDER — SODIUM CHLORIDE 0.9 % IV SOLN
420.0000 mg | Freq: Once | INTRAVENOUS | Status: AC
  Administered 2022-01-24: 420 mg via INTRAVENOUS
  Filled 2022-01-24: qty 14

## 2022-01-24 MED ORDER — ACETAMINOPHEN 325 MG PO TABS: 650.0000 mg | ORAL_TABLET | Freq: Once | ORAL | Status: DC

## 2022-01-24 MED ORDER — SODIUM CHLORIDE 0.9% FLUSH
10.0000 mL | INTRAVENOUS | Status: DC | PRN
  Administered 2022-01-24: 10 mL

## 2022-01-24 MED ORDER — SODIUM CHLORIDE 0.9% FLUSH
10.0000 mL | Freq: Once | INTRAVENOUS | Status: AC
  Administered 2022-01-24: 10 mL

## 2022-01-24 MED ORDER — TRASTUZUMAB-DKST CHEMO 150 MG IV SOLR
6.0000 mg/kg | Freq: Once | INTRAVENOUS | Status: AC
  Administered 2022-01-24: 420 mg via INTRAVENOUS
  Filled 2022-01-24: qty 20

## 2022-01-24 NOTE — Progress Notes (Signed)
Patient declined to stay for post-observation period following pertuzumab administration. Vital signs retaken and remained stable. Patient showed no signs of distress upon discharge.

## 2022-01-24 NOTE — Assessment & Plan Note (Signed)
Robin Horn is here today for follow-up of her stage Ib triple positive breast cancer.  She continues on tamoxifen and Herceptin Perjeta.  She is doing well with this.  She will proceed with Herceptin Perjeta today and I placed orders for her to undergo repeat echocardiogram as it is time for it to be completed.  She is going to undergo port removal on October 12 with Dr. Donne Hazel.  She is okay to receive Herceptin Perjeta IV after this date as she is very anxious to have her port removed.  We will see Robin Horn back on October 17 for her last Herceptin Perjeta.  She knows to give [place] a call between now and her next visit she has any questions or concerns.

## 2022-01-24 NOTE — Patient Instructions (Signed)
[facility]ICAL [facility]COL[facility]  Discharge Instructions: Thank you for choosing [facility] to provide your oncology and hematology care.   If you have a lab appointment with the Cancer Center, please go directly to the Cancer Center and check in at the registration area.   Wear comfortable clothing and clothing appropriate for easy access to any Portacath or PICC line.   We strive to give you quality time with your provider. You may need to reschedule your appointment if you arrive late (15 or more minutes).  Arriving late affects you and other patients whose appointments are after yours.  Also, if you miss three or more appointments without notifying the office, you may be dismissed from the clinic at the provider's discretion.      For prescription refill requests, have your pharmacy contact our office and allow 72 hours for refills to be completed.    Today you received the following chemotherapy and/or immunotherapy agents: trastuzumab, pertuzumab      To help prevent nausea and vomiting after your treatment, we encourage you to take your nausea medication as directed.  BELOW ARE SYMPTOMS THAT SHOULD BE REPORTED IMMEDIATELY: *FEVER GREATER THAN 100.4 F (38 C) OR HIGHER *CHILLS OR SWEATING *NAUSEA AND VOMITING THAT IS NOT CONTROLLED WITH YOUR NAUSEA MEDICATION *UNUSUAL SHORTNESS OF BREATH *UNUSUAL BRUISING OR BLEEDING *URINARY PROBLEMS (pain or burning when urinating, or frequent urination) *BOWEL PROBLEMS (unusual diarrhea, constipation, pain near the anus) TENDERNESS IN MOUTH AND THROAT WITH OR WITHOUT PRESENCE OF ULCERS (sore throat, sores in mouth, or a toothache) UNUSUAL RASH, SWELLING OR PAIN  UNUSUAL VAGINAL DISCHARGE OR ITCHING   Items with * indicate a potential emergency and should be followed up as soon as possible or go to the Emergency Department if any problems should occur.  Please show the CHEMOTHERAPY ALERT CARD or IMMUNOTHERAPY ALERT CARD  at check-in to the Emergency Department and triage nurse.  Should you have questions after your visit or need to cancel or reschedule your appointment, please contact [facility]  Dept: [phone]  and follow the prompts.  Office hours are 8:00 a.m. to 4:30 p.m. Monday - Friday. Please note that voicemails left after 4:00 p.m. may not be returned until the following business day.  We are closed weekends and major holidays. You have access to a nurse at all times for urgent questions. Please call the main number to the clinic Dept: [phone] and follow the prompts.   For any non-urgent questions, you may also contact your provider using MyChart. We now offer e-Visits for anyone 18 and older to request care online for non-urgent symptoms. For details visit [email or web address].   Also download the MyChart app! Go to the app store, search "MyChart", open the app, select [facility], and log in with your MyChart username and password.  Masks are optional in the cancer centers. If you would like for your care team to wear a mask while they are taking care of you, please let them know. You may have one support person who is at least 35 years old accompany you for your appointments. 

## 2022-01-24 NOTE — Progress Notes (Signed)
[facility]:    Robin Gordon, DO [address] [place] [address]   DIAGNOSIS:  Cancer Staging  Malignant neoplasm of upper-outer quadrant of left breast in female, estrogen receptor positive ([place]) Staging form: Breast, AJCC 8th Edition - Clinical stage from 12/23/2020: Stage IB (cT2, cN1, cM0, G3, ER+, PR+, HER2+) - Signed by Robin Lose, MD on 12/23/2020 Stage prefix: Initial diagnosis Histologic grading system: 3 grade system   SUMMARY OF ONCOLOGIC HISTORY: Oncology History  Left breast lump (Resolved)  Malignant neoplasm of upper-outer quadrant of left breast in female, estrogen receptor positive (Robin Horn)  12/17/2020 Initial Diagnosis   Palpable lump in the upper outer left breast. 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, Her2+ (3+)/ER+ (80%)/PR- (<1%) in the left breast, and invasive ductal carcinoma, Her2+ (3+)/ER+ (80%)/PR+ (3%) in the left axillary lymph node   12/23/2020 Cancer Staging   Staging form: Breast, AJCC 8th Edition - Clinical stage from 12/23/2020: Stage IB (cT2, cN1, cM0, G3, ER+, PR+, HER2+) - Signed by Robin Lose, MD on 12/23/2020 Stage prefix: Initial diagnosis Histologic grading system: 3 grade system   01/11/2021 - 04/29/2021 Chemotherapy   Patient is on Treatment Plan : BREAST  Docetaxel + Carboplatin + Trastuzumab + Pertuzumab  (TCHP) q21d      01/31/2021 Genetic Testing   Negative hereditary cancer genetic testing: no pathogenic variants detected in [facility] Common Hereditary Cancers +RNA Panel.  The report date is January 31, 2021.    The Common Hereditary Cancers + RNA Panel offered by [facility] includes sequencing, deletion/duplication, and RNA testing of the following 47 genes: APC, ATM, AXIN2, BARD1, BMPR1A,  BRCA1, BRCA2, BRIP1, CDH1, CDK4*, CDKN2A (p14ARF)*, CDKN2A (p16INK4a)*, CHEK2, CTNNA1, DICER1, EPCAM (Deletion/duplication testing only), GREM1 (promoter region deletion/duplication testing only), KIT, MEN1, MLH1, MSH2, MSH3, MSH6, MUTYH, NBN, NF1, NHTL1, PALB2, PDGFRA*, PMS2, POLD1, POLE, PTEN, RAD50, RAD51C, RAD51D, SDHB, SDHC, SDHD, SMAD4, SMARCA4. STK11, TP53, TSC1, TSC2, and VHL.  The following genes were evaluated for sequence changes only: SDHA and HOXB13 c.251G>A variant only.  RNA analysis is not performed for the * genes.     05/23/2021 Surgery   Left mastectomy: Negative for residual cancer.  Extensive residual high-grade DCIS, 0/4 lymph nodes negative Right mastectomy: Benign Complete pathologic response   06/08/2021 - 12/13/2021 Chemotherapy   Patient is on Treatment Plan : BREAST Trastuzumab  + Pertuzumab q21d x 13 cycles     06/16/2021 -  Chemotherapy   Patient is on Treatment Plan : BREAST Trastuzumab + Pertuzumab q21d       CURRENT THERAPY: Herceptin/Perjeta  INTERVAL HISTORY: Robin Horn 36 y.o. female returns for follow-up while receiving maintenance Herceptin Perjeta.  She is doing well today.  Her most recent echocardiogram was completed on October 24, 2021.  It showed a well-preserved ejection fraction of 55 to 60% with a normal global longitudinal strain.  She is doing well today.  She denies any new concerns.  She has her port removal scheduled in October, but it occurs prior to her final Herceptin Perjeta.  She wants to know if she can receive that treatment by IV.  Otherwise she is doing well and has no concerns today.    Patient Active Problem List   Diagnosis Date Noted   Muscle spasm 10/12/2021   Encounter for cervical  Pap smear with pelvic exam 10/12/2021   Housing instability 10/12/2021   S/P breast reconstruction 05/23/2021   Caries 04/12/2021   Teeth missing 04/12/2021   Accretions on teeth 04/12/2021   Phobia of dental procedure 04/12/2021   Apical  periodontitis 04/12/2021   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   Genetic testing 02/03/2021   Family history of breast cancer 01/18/2021   Port-A-Cath in place 01/18/2021   Malignant neoplasm of upper-outer quadrant of left breast in female, estrogen receptor positive (Robin Horn) 12/23/2020   Urinary problem 11/15/2020   Hypertension 11/15/2020   Encounter for female sterilization procedure 09/24/2016   Supervision of other high risk pregnancy, antepartum 09/20/2016   GDM (gestational diabetes mellitus) 09/11/2016   Gestational hypertension without significant proteinuria 08/29/2016   Bipolar 1 disorder ([place]) 05/25/2016   Insomnia 05/25/2016   Monochorionic diamniotic twin gestation in third trimester 04/25/2016    is allergic to hydrocodone-acetaminophen, tramadol, and lactose intolerance (gi).  MEDICAL HISTORY: Past Medical History:  Diagnosis Date   Abnormal Pap smear of cervix    Anemia    Anxiety    Arthritis    Bipolar disorder (Robin Horn)    diagnosed years ago   Cancer ([place])    Chlamydia    Depression    Drug use affecting pregnancy, antepartum 05/25/2016   History of cocaine use   Encounter for dental examination 04/12/2021   Family history of breast cancer 01/18/2021   GDM (gestational diabetes mellitus)    GERD (gastroesophageal reflux disease)    pt used to take omeprazole, doesn't have much issues with it anymore   Gonorrhea    Herpes genitalia    History of hiatal hernia    History of postpartum hemorrhage, currently pregnant 05/25/2016   History of radiation therapy    Left Chest wall- 09/27/21-11/25/21- Dr. Gery Pray   Migraines    Ovarian cyst    Polycystic kidney disease    Positive GBS test 09/23/2016   Preeclampsia    Pregnancy affected by fetal growth restriction 09/20/2016   Baby B. Getting weekly dopplers, AFI.    Pregnancy induced hypertension     SURGICAL HISTORY: Past Surgical History:  Procedure Laterality Date   BREAST  RECONSTRUCTION WITH PLACEMENT OF TISSUE EXPANDER AND FLEX HD (ACELLULAR HYDRATED DERMIS) Bilateral 05/23/2021   Procedure: BREAST RECONSTRUCTION WITH PLACEMENT OF TISSUE EXPANDER AND FLEX HD (ACELLULAR HYDRATED DERMIS);  Surgeon: Cindra Presume, MD;  Location: [facility];  Service: Plastics;  Laterality: Bilateral;   COLPOSCOPY     DILATION AND CURETTAGE OF UTERUS     MASTECTOMY W/ SENTINEL NODE BIOPSY Left 05/23/2021   Procedure: LEFT MASTECTOMY WITH LEFT AXILLARY SENTINEL LYMPH NODE BIOPSY;  Surgeon: Rolm Bookbinder, MD;  Location: [facility];  Service: General;  Laterality: Left;   PORT A CATH REVISION Right 01/31/2021   Procedure: PORT A CATH REVISION;  Surgeon: Rolm Bookbinder, MD;  Location: [facility];  Service: General;  Laterality: Right;   PORTACATH PLACEMENT N/A 01/06/2021   Procedure: INSERTION PORT-A-CATH;  Surgeon: Rolm Bookbinder, MD;  Location: [facility];  Service: General;  Laterality: N/A;   RADIOACTIVE SEED GUIDED AXILLARY SENTINEL LYMPH NODE Left 05/23/2021   Procedure: RADIOACTIVE SEED GUIDED LEFT AXILLARY SENTINEL LYMPH NODE EXCISION;  Surgeon: Rolm Bookbinder, MD;  Location: [facility];  Service: General;  Laterality: Left;   TONSILLECTOMY     TOTAL MASTECTOMY Right 05/23/2021   Procedure: RIGHT TOTAL MASTECTOMY;  Surgeon: Rolm Bookbinder, MD;  Location: [facility];  Service: General;  Laterality: Right;   TUBAL LIGATION Bilateral 09/24/2016   Procedure: POST PARTUM TUBAL LIGATION;  Surgeon: Lavonia Drafts, MD;  Location: [facility];  Service: Gynecology;  Laterality: Bilateral;    SOCIAL HISTORY: Social History   Socioeconomic History   Marital status: Single    Spouse name: Not on file   Number of children: Not on file   Years of education: Not on file   Highest education level: Not on file  Occupational History   Not on file  Tobacco Use   Smoking status: Every Day    Packs/day: 0.30    Types: Cigarettes   Smokeless tobacco: Never   Tobacco  comments:    Wants to restart Nicotine  Vaping Use   Vaping Use: Never used  Substance and Sexual Activity   Alcohol use: Not Currently    Alcohol/week: 1.0 standard drink of alcohol    Types: 1 Glasses of wine per week   Drug use: Not Currently   Sexual activity: Yes    Birth control/protection: None, Surgical    Comment: Tubal  Other Topics Concern   Not on file  Social History Narrative   Not on file   Social Determinants of Health   Financial Resource Strain: High Risk (10/04/2021)   Overall Financial Resource Strain (CARDIA)    Difficulty of Paying Living Expenses: Very hard  Food Insecurity: No Food Insecurity (10/26/2021)   Hunger Vital Sign    Worried About Running Out of Food in the Last Year: Never true    Ran Out of Food in the Last Year: Never true  Transportation Needs: No Transportation Needs (10/26/2021)   PRAPARE - Hydrologist (Medical): No    Lack of Transportation (Non-Medical): No  Physical Activity: Not on file  Stress: Stress Concern Present (10/26/2021)   [place]    Feeling of Stress : To some extent  Social Connections: Not on file  Intimate Partner Violence: Not on file    FAMILY HISTORY: Family History  Problem Relation Age of Onset   Kidney disease Mother    Breast cancer Maternal Aunt        dx 43s   Breast cancer Other        MGM's sister and MGM's niece; dx after 13   Other Neg Hx     Review of Systems  Constitutional:  Negative for appetite change, chills, fatigue, fever and unexpected weight change.  HENT:   Negative for hearing loss, lump/mass and trouble swallowing.   Eyes:  Negative for eye problems and icterus.  Respiratory:  Negative for chest tightness, cough and shortness of breath.   Cardiovascular:  Negative for chest pain, leg swelling and palpitations.  Gastrointestinal:  Negative for abdominal distention, abdominal pain,  constipation, diarrhea, nausea and vomiting.  Endocrine: Negative for hot flashes.  Genitourinary:  Negative for difficulty urinating.   Musculoskeletal:  Negative for arthralgias.  Skin:  Negative for itching and rash.  Neurological:  Negative for dizziness, extremity weakness, headaches and numbness.  Hematological:  Negative for adenopathy. Does not bruise/bleed easily.  Psychiatric/Behavioral:  Negative for depression. The patient is not nervous/anxious.       PHYSICAL EXAMINATION  ECOG PERFORMANCE STATUS: 1 - Symptomatic but completely ambulatory  Vitals:   01/24/22 1332  BP: (!) 139/90  Pulse: 92  Resp: 18  Temp: 97.9 F (36.6 C)  SpO2: 100%    Physical Exam Constitutional:  General: She is not in acute distress.    Appearance: Normal appearance. She is not toxic-appearing.  HENT:     Head: Normocephalic and atraumatic.  Eyes:     General: No scleral icterus. Cardiovascular:     Rate and Rhythm: Normal rate and regular rhythm.     Pulses: Normal pulses.     Heart sounds: Normal heart sounds.  Pulmonary:     Effort: Pulmonary effort is normal.     Breath sounds: Normal breath sounds.  Abdominal:     General: Abdomen is flat. Bowel sounds are normal. There is no distension.     Palpations: Abdomen is soft.     Tenderness: There is no abdominal tenderness.  Musculoskeletal:        General: No swelling.     Cervical back: Neck supple.  Lymphadenopathy:     Cervical: No cervical adenopathy.  Skin:    General: Skin is warm and dry.     Findings: No rash.  Neurological:     General: No focal deficit present.     Mental Status: She is alert.  Psychiatric:        Mood and Affect: Mood normal.        Behavior: Behavior normal.     LABORATORY DATA:  CBC    Component Value Date/Time   WBC 6.7 01/24/2022 1255   WBC 6.1 05/17/2021 1600   RBC 3.69 (L) 01/24/2022 1255   HGB 10.8 (L) 01/24/2022 1255   HGB 11.2 09/08/2016 0848   HCT 32.7 (L) 01/24/2022  1255   HCT 34.7 09/08/2016 0848   PLT 307 01/24/2022 1255   PLT 193 09/08/2016 0848   MCV 88.6 01/24/2022 1255   MCV 89 09/08/2016 0848   MCH 29.3 01/24/2022 1255   MCHC 33.0 01/24/2022 1255   RDW 14.0 01/24/2022 1255   RDW 16.1 (H) 09/08/2016 0848   LYMPHSABS 1.5 01/24/2022 1255   MONOABS 0.4 01/24/2022 1255   EOSABS 0.5 01/24/2022 1255   BASOSABS 0.0 01/24/2022 1255    CMP     Component Value Date/Time   NA 139 01/24/2022 1255   K 3.9 01/24/2022 1255   CL 104 01/24/2022 1255   CO2 31 01/24/2022 1255   GLUCOSE 88 01/24/2022 1255   BUN 13 01/24/2022 1255   CREATININE 1.42 (H) 01/24/2022 1255   CALCIUM 9.0 01/24/2022 1255   PROT 6.9 01/24/2022 1255   ALBUMIN 3.9 01/24/2022 1255   AST 12 (L) 01/24/2022 1255   ALT 8 01/24/2022 1255   ALKPHOS 47 01/24/2022 1255   BILITOT 0.3 01/24/2022 1255   GFRNONAA 49 (L) 01/24/2022 1255   GFRAA >60 09/18/2018 2340        ASSESSMENT and THERAPY PLAN:   Malignant neoplasm of upper-outer quadrant of left breast in female, estrogen receptor positive ([place]) Cheyne is here today for follow-up of her stage Ib triple positive breast cancer.  She continues on tamoxifen and Herceptin Perjeta.  She is doing well with this.  She will proceed with Herceptin Perjeta today and I placed orders for her to undergo repeat echocardiogram as it is time for it to be completed.  She is going to undergo port removal on October 12 with Dr. Donne Hazel.  She is okay to receive Herceptin Perjeta IV after this date as she is very anxious to have her port removed.  We will see Leliana back on October 17 for her last Herceptin Perjeta.  She knows to give [place] a call between now  and her next visit she has any questions or concerns.   All questions were answered. The patient knows to call the clinic with any problems, questions or concerns. We can certainly see the patient much sooner if necessary.  Total encounter time:20 minutes*in face-to-face visit time, chart  review, lab review, care coordination, order entry, and documentation of the encounter time.    Wilber Bihari, NP 01/24/22 4:08 PM Medical Oncology and Hematology [facility] [place], [place] [address] Tel. [phone]    Fax. [phone]  *Total Encounter Time as defined by the Centers for Medicare and Medicaid Services includes, in addition to the face-to-face time of a patient visit (documented in the note above) non-face-to-face time: obtaining and reviewing outside history, ordering and reviewing medications, tests or procedures, care coordination (communications with other health care professionals or caregivers) and documentation in the medical record.

## 2022-01-24 NOTE — Progress Notes (Signed)
36 year old female diagnosed with breast cancer status post bilateral mastectomies.  CURRENT THERAPY: Herceptin/Perjeta  Past medical history includes anxiety, bipolar disease, depression, GDM, GERD.  Medications include Compazine.  Labs were reviewed.  Height: 5 feet 1 inch. 154 pounds 12.8 ounces September 26. Patient weighed 157 pounds in June. BMI: 28.31.  Consult received from RN due to decreased p.o. intake and nausea.  Patient reports she has had episodes of nausea which does not improve greatly with medication.  She has a history of constipation and takes a stool softener when needed.  She is lactose intolerant.  She does not enjoy most lactose-containing foods.  She does not like being as thin as she is.  She would like to gain some weight.  Nutrition diagnosis: Food and nutrition related knowledge deficit related to breast cancer and associated treatments as evidenced by no prior need for nutrition related information.  Intervention: Educated patient on lactose intolerance. Consume small frequent meals and snacks with adequate calories and protein. Take medication as prescribed by MD. Consider bowel regimen for periodic constipation.  Be sure to increase fluid intake. Provided nutrition facts sheets. Okay to drink 1 carton Ensure Plus high-protein if needed.  We will provide 1 complementary case today. Provided contact information.  Monitoring, evaluation, goals: Patient will tolerate adequate calories and protein to minimize loss of lean body mass.  Next visit: To be scheduled as needed.  **Disclaimer: This note was dictated with voice recognition software. Similar sounding words can inadvertently be transcribed and this note may contain transcription errors which may not have been corrected upon publication of note.**

## 2022-01-24 NOTE — Progress Notes (Signed)
Patient states she took her pre-medications at home around 1245 today.

## 2022-01-27 ENCOUNTER — Ambulatory Visit (HOSPITAL_COMMUNITY)
Admission: RE | Admit: 2022-01-27 | Discharge: 2022-01-27 | Disposition: A | Discharge status: Home / Self Care | Payer: Medicaid Other | Source: Ambulatory Visit | Attending: Adult Health | Admitting: Adult Health

## 2022-01-27 DIAGNOSIS — C50412 Malignant neoplasm of upper-outer quadrant of left female breast: Secondary | ICD-10-CM | POA: Insufficient documentation

## 2022-01-27 DIAGNOSIS — Z923 Personal history of irradiation: Secondary | ICD-10-CM | POA: Diagnosis not present

## 2022-01-27 DIAGNOSIS — Z0189 Encounter for other specified special examinations: Secondary | ICD-10-CM | POA: Diagnosis not present

## 2022-01-27 DIAGNOSIS — Z170 Estrogen receptor positive status [ER+]: Secondary | ICD-10-CM | POA: Diagnosis not present

## 2022-01-27 LAB — ECHOCARDIOGRAM COMPLETE: S' Lateral: 3.5 cm

## 2022-01-27 NOTE — Progress Notes (Signed)
  Echocardiogram 2D Echocardiogram has been performed.  Robin Horn 01/27/2022, 9:52 AM

## 2022-01-28 ENCOUNTER — Other Ambulatory Visit: Payer: Self-pay | Admitting: Internal Medicine

## 2022-01-29 DIAGNOSIS — Z419 Encounter for procedure for purposes other than remedying health state, unspecified: Secondary | ICD-10-CM | POA: Diagnosis not present

## 2022-01-30 ENCOUNTER — Other Ambulatory Visit: Payer: Self-pay

## 2022-01-30 ENCOUNTER — Telehealth: Payer: Self-pay

## 2022-01-30 ENCOUNTER — Encounter: Payer: Self-pay | Admitting: Hematology and Oncology

## 2022-01-30 DIAGNOSIS — C50412 Malignant neoplasm of upper-outer quadrant of left female breast: Secondary | ICD-10-CM

## 2022-01-30 DIAGNOSIS — Z7689 Persons encountering health services in other specified circumstances: Secondary | ICD-10-CM | POA: Diagnosis not present

## 2022-01-30 NOTE — Telephone Encounter (Signed)
Referral sent to [facility] pe message from Wilber Bihari, NP. Called cardiology office regarding urgent referral. Appt scheduled with Dr. Johnsie Cancel at [place] location on 10/9 at 1 pm.  Left message for [facility] with appt details and office address/phone #. Ask her to call the office for questions.

## 2022-01-31 DIAGNOSIS — Z7689 Persons encountering health services in other specified circumstances: Secondary | ICD-10-CM | POA: Diagnosis not present

## 2022-02-01 DIAGNOSIS — Z7689 Persons encountering health services in other specified circumstances: Secondary | ICD-10-CM | POA: Diagnosis not present

## 2022-02-06 ENCOUNTER — Ambulatory Visit: Payer: Medicaid Other | Admitting: Cardiovascular Disease

## 2022-02-06 ENCOUNTER — Other Ambulatory Visit: Payer: Self-pay | Admitting: Hematology and Oncology

## 2022-02-06 NOTE — Telephone Encounter (Signed)
Please deny this is a duplicate

## 2022-02-07 MED ORDER — ZOLPIDEM TARTRATE 5 MG PO TABS: 5.0000 mg | ORAL_TABLET | Freq: Every evening | ORAL | 1 refills | Status: DC | PRN

## 2022-02-09 ENCOUNTER — Other Ambulatory Visit: Payer: Self-pay

## 2022-02-09 DIAGNOSIS — Z452 Encounter for adjustment and management of vascular access device: Secondary | ICD-10-CM | POA: Diagnosis not present

## 2022-02-09 DIAGNOSIS — Z462 Encounter for fitting and adjustment of other devices related to nervous system and special senses: Secondary | ICD-10-CM | POA: Diagnosis not present

## 2022-02-09 DIAGNOSIS — Z853 Personal history of malignant neoplasm of breast: Secondary | ICD-10-CM | POA: Diagnosis not present

## 2022-02-09 DIAGNOSIS — Z7689 Persons encountering health services in other specified circumstances: Secondary | ICD-10-CM | POA: Diagnosis not present

## 2022-02-10 DIAGNOSIS — Z7689 Persons encountering health services in other specified circumstances: Secondary | ICD-10-CM | POA: Diagnosis not present

## 2022-02-11 DIAGNOSIS — Z7689 Persons encountering health services in other specified circumstances: Secondary | ICD-10-CM | POA: Diagnosis not present

## 2022-02-12 DIAGNOSIS — Z7689 Persons encountering health services in other specified circumstances: Secondary | ICD-10-CM | POA: Diagnosis not present

## 2022-02-13 ENCOUNTER — Encounter: Payer: Self-pay | Admitting: *Deleted

## 2022-02-13 DIAGNOSIS — C50412 Malignant neoplasm of upper-outer quadrant of left female breast: Secondary | ICD-10-CM

## 2022-02-14 ENCOUNTER — Inpatient Hospital Stay: Payer: Medicaid Other | Attending: Hematology and Oncology

## 2022-02-14 ENCOUNTER — Telehealth: Payer: Self-pay | Admitting: *Deleted

## 2022-02-14 ENCOUNTER — Inpatient Hospital Stay: Payer: Medicaid Other | Admitting: Adult Health

## 2022-02-14 VITALS — BP 142/108 | HR 81 | Resp 17 | Wt 151.5 lb

## 2022-02-14 DIAGNOSIS — C50412 Malignant neoplasm of upper-outer quadrant of left female breast: Secondary | ICD-10-CM

## 2022-02-14 MED ORDER — DIPHENHYDRAMINE HCL 25 MG PO CAPS: 50.0000 mg | ORAL_CAPSULE | Freq: Once | ORAL | Status: DC

## 2022-02-14 MED ORDER — SODIUM CHLORIDE 0.9 % IV SOLN: Freq: Once | INTRAVENOUS | Status: DC

## 2022-02-14 MED ORDER — ACETAMINOPHEN 325 MG PO TABS: 650.0000 mg | ORAL_TABLET | Freq: Once | ORAL | Status: DC

## 2022-02-14 MED ORDER — TRASTUZUMAB-DKST CHEMO 150 MG IV SOLR: 6.0000 mg/kg | Freq: Once | INTRAVENOUS | Status: DC

## 2022-02-14 MED ORDER — SODIUM CHLORIDE 0.9 % IV SOLN: 420.0000 mg | Freq: Once | INTRAVENOUS | Status: DC

## 2022-02-14 NOTE — Progress Notes (Signed)
Per Mendel Ryder, NP ok to treat with Echo from 01/27/22 results 50-55%

## 2022-02-14 NOTE — Progress Notes (Signed)
Per patient, she took premeds (tylenol and benadryl) PTA for infusion.  Pt also reported, that her port was removed last Thursday the 12th

## 2022-02-14 NOTE — Telephone Encounter (Signed)
Patient missed appt for Survivorship Care Plan. Contacted patient ask if she wants to r/s appt. Per L. Delice Bison, NP, appt can be virtual. Patient in agreement with virtual appt. Schedule message sent

## 2022-02-14 NOTE — Progress Notes (Signed)
Patient received an emergent phone call when starting IV. She had to leave abruptly. Patient will reschedule appointment for next week.

## 2022-02-16 ENCOUNTER — Other Ambulatory Visit: Payer: Self-pay

## 2022-02-21 ENCOUNTER — Other Ambulatory Visit: Payer: Self-pay | Admitting: Hematology and Oncology

## 2022-02-21 ENCOUNTER — Inpatient Hospital Stay: Payer: Medicaid Other

## 2022-02-21 ENCOUNTER — Telehealth: Payer: Self-pay | Admitting: *Deleted

## 2022-02-21 DIAGNOSIS — C50412 Malignant neoplasm of upper-outer quadrant of left female breast: Secondary | ICD-10-CM

## 2022-02-21 NOTE — Telephone Encounter (Signed)
Received message from infusion RN stating pt no show for tx today.  RN contact pt who states she had forgotten about her appt for today.  RN sent high priority message to scheduling to reschedule to next available appt.

## 2022-02-22 ENCOUNTER — Encounter: Payer: Medicaid Other | Admitting: Student

## 2022-02-23 ENCOUNTER — Other Ambulatory Visit: Payer: Self-pay

## 2022-02-23 ENCOUNTER — Telehealth: Payer: Self-pay | Admitting: Hematology and Oncology

## 2022-02-23 NOTE — Telephone Encounter (Signed)
Scheduled appointment per WQ. Patient is aware of the next scheduled appointment.

## 2022-02-24 ENCOUNTER — Other Ambulatory Visit: Payer: Self-pay | Admitting: Internal Medicine

## 2022-02-27 NOTE — Progress Notes (Incomplete)
Patient Care Team: [name], DO as PCP - General (Internal Medicine) Claudia Pollock, RN as Registered Nurse Dayna Barker, MD as Consulting Physician (General Surgery) Ethelda Chick as Social Worker Gery Pray, MD as Consulting Physician (Radiation Oncology) [name], MD as Consulting Physician (Hematology and Oncology) Rolm Bookbinder, MD as Consulting Physician (General Surgery)  DIAGNOSIS: No diagnosis found.  SUMMARY OF ONCOLOGIC HISTORY: Oncology History  Left breast lump (Resolved)  Malignant neoplasm of upper-outer quadrant of left breast in female, estrogen receptor positive ([place])  12/17/2020 Initial Diagnosis   Palpable lump in the upper outer left breast. 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, Her2+ (3+)/ER+ (80%)/PR- (<1%) in the left breast, and invasive ductal carcinoma, Her2+ (3+)/ER+ (80%)/PR+ (3%) in the left axillary lymph node   12/23/2020 Cancer Staging   Staging form: Breast, AJCC 8th Edition - Clinical stage from 12/23/2020: Stage IB (cT2, cN1, cM0, G3, ER+, PR+, HER2+) - Signed by [name], MD on 12/23/2020 Stage prefix: Initial diagnosis Histologic grading system: 3 grade system   01/11/2021 - 04/29/2021 Chemotherapy   Patient is on Treatment Plan : BREAST  Docetaxel + Carboplatin + Trastuzumab + Pertuzumab  (TCHP) q21d      01/31/2021 Genetic Testing   Negative hereditary cancer genetic testing: no pathogenic variants detected in [facility] Common Hereditary Cancers +RNA Panel.  The report date is January 31, 2021.    The Common Hereditary Cancers + RNA Panel offered by [facility] includes sequencing, deletion/duplication, and RNA testing of the following 47 genes: APC, ATM, AXIN2, BARD1, BMPR1A, BRCA1, BRCA2, BRIP1, CDH1, CDK4*, CDKN2A  (p14ARF)*, CDKN2A (p16INK4a)*, CHEK2, CTNNA1, DICER1, EPCAM (Deletion/duplication testing only), GREM1 (promoter region deletion/duplication testing only), KIT, MEN1, MLH1, MSH2, MSH3, MSH6, MUTYH, NBN, NF1, NHTL1, PALB2, PDGFRA*, PMS2, POLD1, POLE, PTEN, RAD50, RAD51C, RAD51D, SDHB, SDHC, SDHD, SMAD4, SMARCA4. STK11, TP53, TSC1, TSC2, and VHL.  The following genes were evaluated for sequence changes only: SDHA and HOXB13 c.251G>A variant only.  RNA analysis is not performed for the * genes.     05/23/2021 Surgery   Left mastectomy: Negative for residual cancer.  Extensive residual high-grade DCIS, 0/4 lymph nodes negative Right mastectomy: Benign Complete pathologic response   06/08/2021 - 12/13/2021 Chemotherapy   Patient is on Treatment Plan : BREAST Trastuzumab  + Pertuzumab q21d x 13 cycles     06/16/2021 -  Chemotherapy   Patient is on Treatment Plan : BREAST Trastuzumab + Pertuzumab q21d     09/27/2021 - 11/25/2021 Radiation Therapy   Site Technique Total Dose (Gy) Dose per Fx (Gy) Completed Fx Beam Energies  Chest Wall, Left: CW_L 3D 50/50 2 25/25 6X, 10X  Sclav-LT: SCV_L 3D 50/50 2 25/25 6X, 10X  Chest Wall, Left: CW_L_Bst Electron 10/10 2 5/5 6E     11/2021 -  Anti-estrogen oral therapy   Tamoxifen     CHIEF COMPLIANT: Follow-up Herceptin Perjeta  INTERVAL HISTORY: Robin Horn is a 36 y.o. with above-mentioned history of left breast cancer, completed chemotherapy with TCHP. She presents to the clinic today for follow-up.    ALLERGIES:  is allergic to hydrocodone-acetaminophen, tramadol, and lactose intolerance (gi).  MEDICATIONS:  Current Outpatient Medications  Medication Sig Dispense Refill   acetaminophen (TYLENOL) 500 MG tablet Take 1,000 mg by mouth every 6 (six) hours as needed (pain).  lidocaine (XYLOCAINE) 2 % solution Use as directed 15 mLs in the mouth or throat every 3 (three) hours as needed for mouth pain. 100 mL 0   lidocaine-prilocaine (EMLA) cream Apply 1  Application topically as needed. 30 g 0   lisinopril (ZESTRIL) 10 MG tablet Take 1 tablet (10 mg total) by mouth daily. 90 tablet 3   prochlorperazine (COMPAZINE) 10 MG tablet Take 1 tablet (10 mg total) by mouth every 6 (six) hours as needed for nausea or vomiting. 20 tablet 0   tamoxifen (NOLVADEX) 20 MG tablet Take 1 tablet (20 mg total) by mouth daily. 90 tablet 3   [START ON 03/03/2022] tiZANidine (ZANAFLEX) 4 MG tablet Take 1 tablet (4 mg total) by mouth 3 (three) times daily. 90 tablet 0   zolpidem (AMBIEN) 5 MG tablet TAKE 2 TABLETS BY MOUTH AT BEDTIME 60 tablet 0   zolpidem (AMBIEN) 5 MG tablet Take 1 tablet (5 mg total) by mouth at bedtime as needed for sleep. 30 tablet 1   No current facility-administered medications for this visit.   Facility-Administered Medications Ordered in Other Visits  Medication Dose Route Frequency Provider Last Rate Last Admin   heparin lock flush 100 unit/mL  500 Units Intracatheter Once [name], MD       sodium chloride flush (NS) 0.9 % injection 10 mL  10 mL Intracatheter Once [name], MD        PHYSICAL EXAMINATION: ECOG PERFORMANCE STATUS: {CHL ONC ECOG KZ:[id number]}  There were no vitals filed for this visit. There were no vitals filed for this visit.  BREAST:*** No palpable masses or nodules in either right or left breasts. No palpable axillary supraclavicular or infraclavicular adenopathy no breast tenderness or nipple discharge. (exam performed in the presence of a chaperone)  LABORATORY DATA:  I have reviewed the data as listed    Latest Ref Rng & Units 01/24/2022   12:55 PM 12/13/2021    1:41 PM 10/31/2021   12:26 PM  CMP  Glucose 70 - 99 mg/dL 88  100  73   BUN 6 - 20 mg/dL _0 Creatinine 0.44 - 1.00 mg/dL 1.42  1.63  1.60   Sodium 135 - 145 mmol/L 139  141  142   Potassium 3.5 - 5.1 mmol/L 3.9  3.6  3.7   Chloride 98 - 111 mmol/L 104  109  107   CO2 22 - 32 mmol/L _1 Calcium 8.9 - 10.3 mg/dL 9.0  8.9   9.3   Total Protein 6.5 - 8.1 g/dL 6.9  6.7  6.9   Total Bilirubin 0.3 - 1.2 mg/dL 0.3  0.2  0.2   Alkaline Phos 38 - 126 U/L 47  68  62   AST 15 - 41 U/L _2 ALT 0 - 44 U/L _3 Lab Results  Component Value Date   WBC 6.7 01/24/2022   HGB 10.8 (L) 01/24/2022   HCT 32.7 (L) 01/24/2022   MCV 88.6 01/24/2022   PLT 307 01/24/2022   NEUTROABS 4.3 01/24/2022    ASSESSMENT & PLAN:  No problem-specific Assessment & Plan notes found for this encounter.    No orders of the defined types were placed in this encounter.  The patient has a good understanding of the overall plan. she agrees with it. she will call with any problems that may develop before  the next visit here. Total time spent: 30 mins including face to face time and time spent for planning, charting and co-ordination of care   Suzzette Righter, [place] 02/27/22    I Gardiner Coins am scribing for Dr. Lindi Adie  ***

## 2022-03-01 ENCOUNTER — Encounter: Payer: Self-pay | Admitting: Cardiology

## 2022-03-01 ENCOUNTER — Ambulatory Visit: Payer: Medicaid Other | Attending: Cardiology | Admitting: Cardiology

## 2022-03-01 VITALS — BP 130/80 | HR 68 | Ht 62.0 in | Wt 153.0 lb

## 2022-03-01 DIAGNOSIS — Z170 Estrogen receptor positive status [ER+]: Secondary | ICD-10-CM | POA: Diagnosis not present

## 2022-03-01 DIAGNOSIS — C50412 Malignant neoplasm of upper-outer quadrant of left female breast: Secondary | ICD-10-CM

## 2022-03-01 DIAGNOSIS — R931 Abnormal findings on diagnostic imaging of heart and coronary circulation: Secondary | ICD-10-CM | POA: Diagnosis not present

## 2022-03-01 DIAGNOSIS — Z419 Encounter for procedure for purposes other than remedying health state, unspecified: Secondary | ICD-10-CM | POA: Diagnosis not present

## 2022-03-01 MED ORDER — METOPROLOL SUCCINATE ER 25 MG PO TB24: 25.0000 mg | ORAL_TABLET | Freq: Every day | ORAL | 3 refills | Status: DC

## 2022-03-01 NOTE — Patient Instructions (Signed)
Medication Instructions:  Please start Metoprolol Succinate 25 mg once a day. Continue all other medications as listed.  *If you need a refill on your cardiac medications before your next appointment, please call your pharmacy*  Testing/Procedures: Your physician has requested that you have an echocardiogram in 6 months. Echocardiography is a painless test that uses sound waves to create images of your heart. It provides your doctor with information about the size and shape of your heart and how well your heart's chambers and valves are working. This procedure takes approximately one hour. There are no restrictions for this procedure. Please do NOT wear cologne, perfume, aftershave, or lotions (deodorant is allowed). Please arrive 15 minutes prior to your appointment time.  Follow-Up: At [facility], you and your health needs are our priority.  As part of our continuing mission to provide you with exceptional heart care, we have created designated Provider Care Teams.  These Care Teams include your primary Cardiologist (physician) and Advanced Practice Providers (APPs -  Physician Assistants and Nurse Practitioners) who all work together to provide you with the care you need, when you need it.  We recommend signing up for the patient portal called "MyChart".  Sign up information is provided on this After Visit Summary.  MyChart is used to connect with patients for Virtual Visits (Telemedicine).  Patients are able to view lab/test results, encounter notes, upcoming appointments, etc.  Non-urgent messages can be sent to your provider as well.   To learn more about what you can do with MyChart, go to [email or web address].    Your next appointment:   6 month(s)  The format for your next appointment:   In Person  Provider:   Nicholes Rough, PA-C, Ambrose Pancoast, NP, Ermalinda Barrios, PA-C, Christen Bame, NP, or Richardson Dopp, PA-C          Important Information About Sugar

## 2022-03-01 NOTE — Progress Notes (Signed)
Cardiology Office Note:    Date:  03/01/2022   ID:  Robin Horn, DOB 1985/09/19, MRN [id number]  PCP:  [name], DO   [place] Providers Cardiologist:  None     Referring MD: [name], DO   No chief complaint on file.   History of Present Illness:    Robin Horn is a 36 y.o. female with a hx of HTN, DM, kidney disease, and breast cancer presenting to clinic today for evaluation of decreased LVEF.  She has left breast cancer and is receiving maintenance Herceptin Perjeta. Her echocardiogram on 10/24/21 sowed normal LVEF of 55-60%. However, her echo on 01/27/22 showed an LVEF of 50-55%.  Today, she states that she has some mild shortness of breath at times. She reports that she sleeps on her side for the most but feels that she may have orthopnea if she sleeps on her back. She denies any chest pain. Overall, she feels that she is doing okay.  She notes that she came in today to provide reassurance to her mother in regards to her heart. Her mother has been dealing with depression since pt was diagnosed with breast cancer.  Past Medical History:  Diagnosis Date   Abnormal Pap smear of cervix    Anemia    Anxiety    Arthritis    Bipolar disorder ([place])    diagnosed years ago   Cancer ([place])    Chlamydia    Depression    Drug use affecting pregnancy, antepartum 05/25/2016   History of cocaine use   Encounter for dental examination 04/12/2021   Family history of breast cancer 01/18/2021   GDM (gestational diabetes mellitus)    GERD (gastroesophageal reflux disease)    pt used to take omeprazole, doesn't have much issues with it anymore   Gonorrhea    Herpes genitalia    History of hiatal hernia    History of postpartum hemorrhage, currently pregnant 05/25/2016   History of radiation therapy    Left Chest wall- 09/27/21-11/25/21- Dr. Gery Pray   Migraines    Ovarian cyst    Polycystic kidney disease    Positive GBS test 09/23/2016   Preeclampsia     Pregnancy affected by fetal growth restriction 09/20/2016   Baby B. Getting weekly dopplers, AFI.    Pregnancy induced hypertension     Past Surgical History:  Procedure Laterality Date   BREAST RECONSTRUCTION WITH PLACEMENT OF TISSUE EXPANDER AND FLEX HD (ACELLULAR HYDRATED DERMIS) Bilateral 05/23/2021   Procedure: BREAST RECONSTRUCTION WITH PLACEMENT OF TISSUE EXPANDER AND FLEX HD (ACELLULAR HYDRATED DERMIS);  Surgeon: Cindra Presume, MD;  Location: [place];  Service: Plastics;  Laterality: Bilateral;   COLPOSCOPY     DILATION AND CURETTAGE OF UTERUS     MASTECTOMY W/ SENTINEL NODE BIOPSY Left 05/23/2021   Procedure: LEFT MASTECTOMY WITH LEFT AXILLARY SENTINEL LYMPH NODE BIOPSY;  Surgeon: Rolm Bookbinder, MD;  Location: [place];  Service: General;  Laterality: Left;   PORT A CATH REVISION Right 01/31/2021   Procedure: PORT A CATH REVISION;  Surgeon: Rolm Bookbinder, MD;  Location: [place];  Service: General;  Laterality: Right;   PORTACATH PLACEMENT N/A 01/06/2021   Procedure: INSERTION PORT-A-CATH;  Surgeon: Rolm Bookbinder, MD;  Location: [place];  Service: General;  Laterality: N/A;   RADIOACTIVE SEED GUIDED AXILLARY SENTINEL LYMPH NODE Left 05/23/2021   Procedure: RADIOACTIVE SEED GUIDED LEFT AXILLARY SENTINEL LYMPH NODE EXCISION;  Surgeon: Rolm Bookbinder, MD;  Location: [facility]  OR;  Service: General;  Laterality: Left;   TONSILLECTOMY     TOTAL MASTECTOMY Right 05/23/2021   Procedure: RIGHT TOTAL MASTECTOMY;  Surgeon: Rolm Bookbinder, MD;  Location: [place];  Service: General;  Laterality: Right;   TUBAL LIGATION Bilateral 09/24/2016   Procedure: POST PARTUM TUBAL LIGATION;  Surgeon: [place] Drafts, MD;  Location: [place];  Service: Gynecology;  Laterality: Bilateral;    Current Medications: Current Meds  Medication Sig   lisinopril (ZESTRIL) 10 MG tablet Take 1 tablet (10 mg total) by mouth daily.   metoprolol succinate (TOPROL-XL) 25 MG 24  hr tablet Take 1 tablet (25 mg total) by mouth daily.   tamoxifen (NOLVADEX) 20 MG tablet Take 1 tablet (20 mg total) by mouth daily.   [START ON 03/03/2022] tiZANidine (ZANAFLEX) 4 MG tablet Take 1 tablet (4 mg total) by mouth 3 (three) times daily.   zolpidem (AMBIEN) 5 MG tablet Take 1 tablet (5 mg total) by mouth at bedtime as needed for sleep.     Allergies:   Hydrocodone-acetaminophen, Tramadol, and Lactose intolerance (gi)   Social History   Socioeconomic History   Marital status: Single    Spouse name: Not on file   Number of children: Not on file   Years of education: Not on file   Highest education level: Not on file  Occupational History   Not on file  Tobacco Use   Smoking status: Every Day    Packs/day: 0.30    Types: Cigarettes   Smokeless tobacco: Never   Tobacco comments:    Wants to restart Nicotine  Vaping Use   Vaping Use: Never used  Substance and Sexual Activity   Alcohol use: Not Currently    Alcohol/week: 1.0 standard drink of alcohol    Types: 1 Glasses of wine per week   Drug use: Not Currently   Sexual activity: Yes    Birth control/protection: None, Surgical    Comment: Tubal  Other Topics Concern   Not on file  Social History Narrative   Not on file   Social Determinants of Health   Financial Resource Strain: High Risk (10/04/2021)   Overall Financial Resource Strain (CARDIA)    Difficulty of Paying Living Expenses: Very hard  Food Insecurity: No Food Insecurity (10/26/2021)   Hunger Vital Sign    Worried About Running Out of Food in the Last Year: Never true    Ran Out of Food in the Last Year: Never true  Transportation Needs: No Transportation Needs (10/26/2021)   PRAPARE - Hydrologist (Medical): No    Lack of Transportation (Non-Medical): No  Physical Activity: Not on file  Stress: Stress Concern Present (10/26/2021)   [place]    Feeling  of Stress : To some extent  Social Connections: Not on file     Family History: The patient's family history includes Breast cancer in her maternal aunt and another family member; Kidney disease in her mother. There is no history of Other.  ROS:   Please see the history of present illness.    (+) Shortness of breath (+) Orthopnea All other systems reviewed and are negative.  EKGs/Labs/Other Studies Reviewed:    The following studies were reviewed today:  Echo 01/27/22: 1. Left ventricular ejection fraction, by estimation, is 50 to 55%. The  left ventricle has low normal function. The left ventricle has no regional  wall motion abnormalities. Left ventricular diastolic  parameters were  normal. The average left ventricular   global longitudinal strain is -14.5 %. GLS underestimated due to poor  endocardial tracking.   2. Right ventricular systolic function is normal. The right ventricular  size is normal. There is normal pulmonary artery systolic pressure.   3. The mitral valve is normal in structure. No evidence of mitral valve  regurgitation. No evidence of mitral stenosis.   4. The aortic valve is normal in structure. Aortic valve regurgitation is  not visualized. No aortic stenosis is present.   5. The inferior vena cava is normal in size with greater than 50%  respiratory variability, suggesting right atrial pressure of 3 mmHg.   Echo 10/24/21: 1. Compared to 06/28/21 GLS is now normal. Left ventricular ejection fraction, by estimation, is 55 to 60%. The left ventricle has normal function. The left ventricle has no regional wall motion abnormalities. Left ventricular diastolic parameters were normal. The average left ventricular global longitudinal strain is -21.1 %. The global longitudinal strain is normal. 2. Right ventricular systolic function is normal. The right ventricular size is normal. Tricuspid regurgitation signal is inadequate for assessing PA pressure. 3. The  mitral valve is normal in structure. Trivial mitral valve regurgitation. No evidence of mitral stenosis. 4. The aortic valve is tricuspid. Aortic valve regurgitation is not visualized. No aortic stenosis is present. 5. The inferior vena cava is normal in size with greater than 50% respiratory variability, suggesting right atrial pressure of 3 mmHg.  EKG:  EKG was ordered today.  The ekg ordered today demonstrates sinus rhythm with a rate of 68bpm and non-specifc T wave changes  Recent Labs: 01/24/2022: ALT 8; BUN 13; Creatinine 1.42; Hemoglobin 10.8; Platelet Count 307; Potassium 3.9; Sodium 139  Recent Lipid Panel No results found for: "CHOL", "TRIG", "HDL", "CHOLHDL", "VLDL", "LDLCALC", "LDLDIRECT"   Risk Assessment/Calculations:               Physical Exam:    VS:  BP 130/80 (BP Location: Left Arm, Patient Position: Sitting, Cuff Size: Normal)   Pulse 68   Ht '5\' 2"'$  (1.575 m)   Wt 153 lb (69.4 kg)   SpO2 95%   BMI 27.98 kg/m     Wt Readings from Last 3 Encounters:  03/01/22 153 lb (69.4 kg)  02/14/22 151 lb 8 oz (68.7 kg)  01/24/22 154 lb 12.8 oz (70.2 kg)     GEN: Well nourished, well developed in no acute distress HEENT: Normal NECK: No JVD; No carotid bruits LYMPHATICS: No lymphadenopathy CARDIAC: RRR, no murmurs, rubs, gallops RESPIRATORY:  Clear to auscultation without rales, wheezing or rhonchi  ABDOMEN: Soft, non-tender, non-distended MUSCULOSKELETAL:  No edema; No deformity  SKIN: Warm and dry NEUROLOGIC:  Alert and oriented x 3 PSYCHIATRIC:  Normal affect   ASSESSMENT:    1. Malignant neoplasm of upper-outer quadrant of left breast in female, estrogen receptor positive ([place])   2. Decreased cardiac ejection fraction    PLAN:    In order of problems listed above:  Left-sided breast cancer, mildly reduced ejection fraction 50 to 55% - She is on lisinopril 10 mg.  Excellent. - Lets go ahead and start Toprol-XL 25 mg a day.  This will help overall  pump function as well.  I think that the reduction in ejection fraction is very minimal.  GLS also can be challenging from a technical standpoint.  I would not have any difficulties with her continuing her breast cancer treatment per oncology.  Lets see her  back in 6 months with echocardiogram repeat prior.          Medication Adjustments/Labs and Tests Ordered: Current medicines are reviewed at length with the patient today.  Concerns regarding medicines are outlined above.  Orders Placed This Encounter  Procedures   EKG 12-Lead   ECHOCARDIOGRAM COMPLETE   Meds ordered this encounter  Medications   metoprolol succinate (TOPROL-XL) 25 MG 24 hr tablet    Sig: Take 1 tablet (25 mg total) by mouth daily.    Dispense:  90 tablet    Refill:  3    Patient Instructions  Medication Instructions:  Please start Metoprolol Succinate 25 mg once a day. Continue all other medications as listed.  *If you need a refill on your cardiac medications before your next appointment, please call your pharmacy*  Testing/Procedures: Your physician has requested that you have an echocardiogram in 6 months. Echocardiography is a painless test that uses sound waves to create images of your heart. It provides your doctor with information about the size and shape of your heart and how well your heart's chambers and valves are working. This procedure takes approximately one hour. There are no restrictions for this procedure. Please do NOT wear cologne, perfume, aftershave, or lotions (deodorant is allowed). Please arrive 15 minutes prior to your appointment time.  Follow-Up: At [facility], you and your health needs are our priority.  As part of our continuing mission to provide you with exceptional heart care, we have created designated Provider Care Teams.  These Care Teams include your primary Cardiologist (physician) and Advanced Practice Providers (APPs -  Physician Assistants and Nurse  Practitioners) who all work together to provide you with the care you need, when you need it.  We recommend signing up for the patient portal called "MyChart".  Sign up information is provided on this After Visit Summary.  MyChart is used to connect with patients for Virtual Visits (Telemedicine).  Patients are able to view lab/test results, encounter notes, upcoming appointments, etc.  Non-urgent messages can be sent to your provider as well.   To learn more about what you can do with MyChart, go to [email or web address].    Your next appointment:   6 month(s)  The format for your next appointment:   In Person  Provider:   Nicholes Rough, PA-C, Ambrose Pancoast, NP, Ermalinda Barrios, PA-C, Christen Bame, NP, or Richardson Dopp, PA-C          Important Information About Sugar          I,Alexis Herring,acting as a scribe for Candee Furbish, MD.,have documented all relevant documentation on the behalf of Candee Furbish, MD,as directed by  Candee Furbish, MD while in the presence of Candee Furbish, MD.  I, Candee Furbish, MD, have reviewed all documentation for this visit. The documentation on 03/01/22 for the exam, diagnosis, procedures, and orders are all accurate and complete.   Signed, Candee Furbish, MD  03/01/2022 11:53 AM    [place]

## 2022-03-02 ENCOUNTER — Inpatient Hospital Stay: Payer: Medicaid Other | Admitting: Adult Health

## 2022-03-02 ENCOUNTER — Telehealth: Payer: Self-pay | Admitting: Adult Health

## 2022-03-02 NOTE — Telephone Encounter (Signed)
Contacted patient to scheduled appointments. Patient is aware of appointments that are scheduled.   

## 2022-03-06 ENCOUNTER — Inpatient Hospital Stay: Payer: Medicaid Other

## 2022-03-06 ENCOUNTER — Inpatient Hospital Stay: Payer: Medicaid Other | Admitting: Hematology and Oncology

## 2022-03-06 ENCOUNTER — Telehealth: Payer: Self-pay

## 2022-03-06 DIAGNOSIS — Z7689 Persons encountering health services in other specified circumstances: Secondary | ICD-10-CM | POA: Diagnosis not present

## 2022-03-06 MED ORDER — AMOXICILLIN 500 MG PO TABS: 500.0000 mg | ORAL_TABLET | Freq: Two times a day (BID) | ORAL | 0 refills | Status: AC

## 2022-03-06 NOTE — Telephone Encounter (Signed)
Pt called and states she and her children have been sick for a few days. She states a NP at an urgent care looked in her (R) ear and told her she had fluid behind her ear drum. Pt states she has not had a COVID positive test. Per MD, Rx sent for Amoxicillin 500 mg 1 tab BID x 7 days no refills to [facility] on [facility]. Per verbalized thanks and understanding.

## 2022-03-06 NOTE — Assessment & Plan Note (Deleted)
12/17/2020: Palpable lump in the upper outer left breast. 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 Neoadjuvant chemotherapy with TCH Perjeta 6 cycles completed 04/26/2021 followed by Herceptin Perjeta maintenance for 1 year completed 01/03/2022 2. Left mastectomy 05/23/2021: Negative for residual cancer.  Extensive residual high-grade DCIS, 0/4 lymph nodes negative; Right mastectomy: Benign  Complete pathologic response 3. Followed by adjuvant radiation therapy completed 11/25/2021 4.  Followed by adjuvant antiestrogen therapy started 12/13/2021 -------------------------------------------------------------------------------------------------------------------------------------------------- Current treatment: Tamoxifen started 12/13/2021 Tamoxifen toxicities:  Breast cancer surveillance: We will set her up for a mammogram on the right breast Return to clinic in 6 months for follow-up

## 2022-03-07 ENCOUNTER — Other Ambulatory Visit: Payer: Self-pay

## 2022-03-07 DIAGNOSIS — Z7689 Persons encountering health services in other specified circumstances: Secondary | ICD-10-CM | POA: Diagnosis not present

## 2022-03-11 DIAGNOSIS — Z7689 Persons encountering health services in other specified circumstances: Secondary | ICD-10-CM | POA: Diagnosis not present

## 2022-03-12 DIAGNOSIS — Z7689 Persons encountering health services in other specified circumstances: Secondary | ICD-10-CM | POA: Diagnosis not present

## 2022-03-12 NOTE — Progress Notes (Incomplete)
Patient Care Team: [name], DO as PCP - General (Internal Medicine) Claudia Pollock, RN as Registered Nurse Dayna Barker, MD as Consulting Physician (General Surgery) Ethelda Chick as Social Worker Gery Pray, MD as Consulting Physician (Radiation Oncology) [name], MD as Consulting Physician (Hematology and Oncology) Rolm Bookbinder, MD as Consulting Physician (General Surgery)  DIAGNOSIS: No diagnosis found.  SUMMARY OF ONCOLOGIC HISTORY: Oncology History  Left breast lump (Resolved)  Malignant neoplasm of upper-outer quadrant of left breast in female, estrogen receptor positive ([place])  12/17/2020 Initial Diagnosis   Palpable lump in the upper outer left breast. 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, Her2+ (3+)/ER+ (80%)/PR- (<1%) in the left breast, and invasive ductal carcinoma, Her2+ (3+)/ER+ (80%)/PR+ (3%) in the left axillary lymph node   12/23/2020 Cancer Staging   Staging form: Breast, AJCC 8th Edition - Clinical stage from 12/23/2020: Stage IB (cT2, cN1, cM0, G3, ER+, PR+, HER2+) - Signed by [name], MD on 12/23/2020 Stage prefix: Initial diagnosis Histologic grading system: 3 grade system   01/11/2021 - 04/29/2021 Chemotherapy   Patient Horn on Treatment Plan : BREAST  Docetaxel + Carboplatin + Trastuzumab + Pertuzumab  (TCHP) q21d      01/31/2021 Genetic Testing   Negative hereditary cancer genetic testing: no pathogenic variants detected in [facility] Common Hereditary Cancers +RNA Panel.  The report date Horn January 31, 2021.    The Common Hereditary Cancers + RNA Panel offered by [facility] includes sequencing, deletion/duplication, and RNA testing of the following 47 genes: APC, ATM, AXIN2, BARD1, BMPR1A, BRCA1, BRCA2, BRIP1, CDH1, CDK4*, CDKN2A  (p14ARF)*, CDKN2A (p16INK4a)*, CHEK2, CTNNA1, DICER1, EPCAM (Deletion/duplication testing only), GREM1 (promoter region deletion/duplication testing only), KIT, MEN1, MLH1, MSH2, MSH3, MSH6, MUTYH, NBN, NF1, NHTL1, PALB2, PDGFRA*, PMS2, POLD1, POLE, PTEN, RAD50, RAD51C, RAD51D, SDHB, SDHC, SDHD, SMAD4, SMARCA4. STK11, TP53, TSC1, TSC2, and VHL.  The following genes were evaluated for sequence changes only: SDHA and HOXB13 c.251G>A variant only.  RNA analysis Horn not performed for the * genes.     05/23/2021 Surgery   Left mastectomy: Negative for residual cancer.  Extensive residual high-grade DCIS, 0/4 lymph nodes negative Right mastectomy: Benign Complete pathologic response   06/08/2021 - 12/13/2021 Chemotherapy   Patient Horn on Treatment Plan : BREAST Trastuzumab  + Pertuzumab q21d x 13 cycles     06/16/2021 -  Chemotherapy   Patient Horn on Treatment Plan : BREAST Trastuzumab + Pertuzumab q21d     09/27/2021 - 11/25/2021 Radiation Therapy   Site Technique Total Dose (Gy) Dose per Fx (Gy) Completed Fx Beam Energies  Chest Wall, Left: CW_L 3D 50/50 2 25/25 6X, 10X  Sclav-LT: SCV_L 3D 50/50 2 25/25 6X, 10X  Chest Wall, Left: CW_L_Bst Electron 10/10 2 5/5 6E     11/2021 -  Anti-estrogen oral therapy   Tamoxifen     CHIEF COMPLIANT:   INTERVAL HISTORY: Robin Horn a   ALLERGIES:  Horn allergic to hydrocodone-acetaminophen, tramadol, and lactose intolerance (gi).  MEDICATIONS:  Current Outpatient Medications  Medication Sig Dispense Refill   amoxicillin (AMOXIL) 500 MG tablet Take 1 tablet (500 mg total) by mouth 2 (two) times daily for 7 days. 14 tablet 0   lisinopril (ZESTRIL) 10 MG tablet Take 1 tablet (10 mg total) by mouth daily. 90 tablet 3  metoprolol succinate (TOPROL-XL) 25 MG 24 hr tablet Take 1 tablet (25 mg total) by mouth daily. 90 tablet 3   tamoxifen (NOLVADEX) 20 MG tablet Take 1 tablet (20 mg total) by mouth daily. 90 tablet 3   tiZANidine (ZANAFLEX) 4 MG tablet Take  1 tablet (4 mg total) by mouth 3 (three) times daily. 90 tablet 0   zolpidem (AMBIEN) 5 MG tablet Take 1 tablet (5 mg total) by mouth at bedtime as needed for sleep. 30 tablet 1   No current facility-administered medications for this visit.   Facility-Administered Medications Ordered in Other Visits  Medication Dose Route Frequency Provider Last Rate Last Admin   heparin lock flush 100 unit/mL  500 Units Intracatheter Once [name], MD       sodium chloride flush (NS) 0.9 % injection 10 mL  10 mL Intracatheter Once [name], MD        PHYSICAL EXAMINATION: ECOG PERFORMANCE STATUS: {CHL ONC ECOG WG:[id number]}  There were no vitals filed for this visit. There were no vitals filed for this visit.  BREAST:*** No palpable masses or nodules in either right or left breasts. No palpable axillary supraclavicular or infraclavicular adenopathy no breast tenderness or nipple discharge. (exam performed in the presence of a chaperone)  LABORATORY DATA:  I have reviewed the data as listed    Latest Ref Rng & Units 01/24/2022   12:55 PM 12/13/2021    1:41 PM 10/31/2021   12:26 PM  CMP  Glucose 70 - 99 mg/dL 88  100  73   BUN 6 - 20 mg/dL _0 Creatinine 0.44 - 1.00 mg/dL 1.42  1.63  1.60   Sodium 135 - 145 mmol/L 139  141  142   Potassium 3.5 - 5.1 mmol/L 3.9  3.6  3.7   Chloride 98 - 111 mmol/L 104  109  107   CO2 22 - 32 mmol/L _1 Calcium 8.9 - 10.3 mg/dL 9.0  8.9  9.3   Total Protein 6.5 - 8.1 g/dL 6.9  6.7  6.9   Total Bilirubin 0.3 - 1.2 mg/dL 0.3  0.2  0.2   Alkaline Phos 38 - 126 U/L 47  68  62   AST 15 - 41 U/L _2 ALT 0 - 44 U/L _3 Lab Results  Component Value Date   WBC 6.7 01/24/2022   HGB 10.8 (L) 01/24/2022   HCT 32.7 (L) 01/24/2022   MCV 88.6 01/24/2022   PLT 307 01/24/2022   NEUTROABS 4.3 01/24/2022    ASSESSMENT & PLAN:  No problem-specific Assessment & Plan notes found for this encounter.    No orders of the defined  types were placed in this encounter.  The patient has a good understanding of the overall plan. she agrees with it. she will call with any problems that may develop before the next visit here. Total time spent: 30 mins including face to face time and time spent for planning, charting and co-ordination of care   Robin Horn, [place] 03/12/22    I Gardiner Coins am scribing for Dr. Lindi Adie  ***

## 2022-03-13 DIAGNOSIS — Z7689 Persons encountering health services in other specified circumstances: Secondary | ICD-10-CM | POA: Diagnosis not present

## 2022-03-14 ENCOUNTER — Encounter: Payer: Self-pay | Admitting: Student

## 2022-03-14 ENCOUNTER — Ambulatory Visit (INDEPENDENT_AMBULATORY_CARE_PROVIDER_SITE_OTHER): Payer: Medicaid Other | Admitting: Student

## 2022-03-14 ENCOUNTER — Other Ambulatory Visit (HOSPITAL_COMMUNITY)
Admission: RE | Admit: 2022-03-14 | Discharge: 2022-03-14 | Disposition: A | Discharge status: Home / Self Care | Payer: Medicaid Other | Source: Ambulatory Visit | Attending: Internal Medicine | Admitting: Internal Medicine

## 2022-03-14 VITALS — BP 115/60 | HR 71 | Temp 98.2°F | Ht 62.0 in | Wt 157.0 lb

## 2022-03-14 DIAGNOSIS — F17210 Nicotine dependence, cigarettes, uncomplicated: Secondary | ICD-10-CM

## 2022-03-14 DIAGNOSIS — Z202 Contact with and (suspected) exposure to infections with a predominantly sexual mode of transmission: Secondary | ICD-10-CM | POA: Insufficient documentation

## 2022-03-14 DIAGNOSIS — I10 Essential (primary) hypertension: Secondary | ICD-10-CM | POA: Diagnosis not present

## 2022-03-14 NOTE — Progress Notes (Signed)
CC: Blood pressure follow-up and STD check  HPI:  [name] is a 36 y.o. female with a past medical history of hypertension presenting for blood pressure follow-up.  Patient also wants a STD check.  Please see assessment and plan for full HPI.  Past Medical History:  Diagnosis Date   Abnormal Pap smear of cervix    Anemia    Anxiety    Arthritis    Bipolar disorder ([place])    diagnosed years ago   Cancer ([place])    Chlamydia    Depression    Drug use affecting pregnancy, antepartum 05/25/2016   History of cocaine use   Encounter for dental examination 04/12/2021   Family history of breast cancer 01/18/2021   GDM (gestational diabetes mellitus)    GERD (gastroesophageal reflux disease)    pt used to take omeprazole, doesn't have much issues with it anymore   Gonorrhea    Herpes genitalia    History of hiatal hernia    History of postpartum hemorrhage, currently pregnant 05/25/2016   History of radiation therapy    Left Chest wall- 09/27/21-11/25/21- Dr. Gery Pray   Migraines    Ovarian cyst    Polycystic kidney disease    Positive GBS test 09/23/2016   Preeclampsia    Pregnancy affected by fetal growth restriction 09/20/2016   Baby B. Getting weekly dopplers, AFI.    Pregnancy induced hypertension      Current Outpatient Medications:    metroNIDAZOLE (FLAGYL) 500 MG tablet, Take 1 tablet (500 mg total) by mouth 2 (two) times daily for 7 days., Disp: 14 tablet, Rfl: 0   lisinopril (ZESTRIL) 10 MG tablet, Take 1 tablet (10 mg total) by mouth daily., Disp: 90 tablet, Rfl: 3   metoprolol succinate (TOPROL-XL) 25 MG 24 hr tablet, Take 1 tablet (25 mg total) by mouth daily., Disp: 90 tablet, Rfl: 3   tamoxifen (NOLVADEX) 20 MG tablet, Take 1 tablet (20 mg total) by mouth daily., Disp: 90 tablet, Rfl: 3   zolpidem (AMBIEN) 5 MG tablet, Take 1 tablet (5 mg total) by mouth at bedtime as needed for sleep., Disp: 30 tablet, Rfl: 1  Review of Systems:     Constitutional: Patient denies any headaches Eye: Patient denies any vision changes Respiratory: Patient denies any shortness of breath Cardiovascular: Patient denies any chest pain GU: Patient endorses dysuria as well as vaginal discharge   Physical Exam:  Vitals:   03/14/22 0903  BP: 115/60  Pulse: 71  Temp: 98.2 F (36.8 C)  TempSrc: Oral  SpO2: 100%  Weight: 157 lb (71.2 kg)  Height: '5\' 2"'$  (1.575 m)    General: Patient is sitting comfortably in the room  Head: Normocephalic, atraumatic  Cardio: Regular rate and rhythm, no murmurs, rubs or gallops. 2+ pulses to bilateral upper and lower extremities  Pulmonary: Clear to ausculation bilaterally with no rales, rhonchi, and crackles    Assessment & Plan:   Hypertension Blood pressure today is 115/60.  Patient is currently managed with lisinopril 10 mg daily.  Patient was recently started on metoprolol succinate 25 mg daily.  Patient denies any headache or vision changes.  She denies any lightheadedness or dizziness.  Blood pressure is well controlled.  Plan: -Follow-up in 3 months for blood pressure check -Continue lisinopril 10 mg daily -Continue metoprolol 25 mg daily (prescribed by cardiology for heart failure with reduced ejection fraction)  Potential exposure to STD Patient reports that about 1 month ago, she had unprotected  sexual intercourse.  She states that 2 weeks ago she developed cramps, vaginal discharge, and a foul odor.  She also reports having burning sensation with urination.  She denies any fever, but does report some chills.  She states that this drainage is cloudy and yellow in nature.  She also reports having some bloody discharge.  She states that whenever she has to urinate, it burns.  Patient reports that she has a past medical history of genital herpes, but is not currently concerned about this.  Given history of unprotected intercourse, STD is of concern at this time.  There is also concern for  urinary tract infection given burning with urination.  Plan will be to check for common pathogens causing discharge such as Candida, gonorrhea, chlamydia.  Patient also request syphilis and HIV testing.    Plan: -Self swab pending -UA pending -HIV antibody pending -RPR pending -Depending on results, plan will be to treat accordingly  Addendum: -Self swab positive for bacterial vaginosis.  UA showing some leukocytes.  HIV negative.  RPR negative.  Plan will be to treat with metronidazole 500 mg twice daily for the next 7 days for bacterial vaginosis.   Patient seen with Dr. [name], DO PGY-1 Internal Medicine Resident  Pager: [phone]

## 2022-03-14 NOTE — Assessment & Plan Note (Signed)
Patient reports that about 1 month ago, she had unprotected sexual intercourse.  She states that 2 weeks ago she developed cramps, vaginal discharge, and a foul odor.  She also reports having burning sensation with urination.  She denies any fever, but does report some chills.  She states that this drainage is cloudy and yellow in nature.  She also reports having some bloody discharge.  She states that whenever she has to urinate, it burns.  Patient reports that she has a past medical history of genital herpes, but is not currently concerned about this.  Given history of unprotected intercourse, STD is of concern at this time.  There is also concern for urinary tract infection given burning with urination.  Plan will be to check for common pathogens causing discharge such as Candida, gonorrhea, chlamydia.  Patient also request syphilis and HIV testing.    Plan: -Self swab pending -UA pending -HIV antibody pending -RPR pending -Depending on results, plan will be to treat accordingly  Addendum: -Self swab positive for bacterial vaginosis.  UA showing some leukocytes.  HIV negative.  RPR negative.  Plan will be to treat with metronidazole 500 mg twice daily for the next 7 days for bacterial vaginosis.

## 2022-03-14 NOTE — Assessment & Plan Note (Deleted)
12/17/2020: Palpable lump in the upper outer left breast. 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 Neoadjuvant chemotherapy with TCH Perjeta 6 cycles completed 04/26/2021 followed by Herceptin Perjeta maintenance versus Kadcyla maintenance (based on response to neoadjuvant chemo) for 1 year 2. Left mastectomy 05/23/2021: Negative for residual cancer.  Extensive residual high-grade DCIS, 0/4 lymph nodes negative; Right mastectomy: Benign  Complete pathologic response 3. Followed by adjuvant radiation therapy  4.  Followed by adjuvant antiestrogen therapy -------------------------------------------------------------------------------------------------------------------------------------------------- Current treatment: Herceptin Perjeta maintenance completed 01/03/2022 , Tamoxifen started     Tamoxifen Toxicities:     RTC in 6 months and after that Annually

## 2022-03-14 NOTE — Patient Instructions (Addendum)
Ellis, [place] you for allowing me to take part in your care today.  Here are your instructions.  1.  Regarding STD check, I have pulled labs to check for HIV as well as syphilis.  I also have checked your urine for any urinary tract infection.  You have done a self swab in the office today, I will call with results.  We will treat accordingly.  2.  Regarding blood pressure, continue doing lisinopril 10 mg daily.  Your blood pressure is well controlled at this point.  Please follow-up in about 3 months for blood pressure follow-up  3.  If you do have a genital herpes outbreak, please call, and we can appropriately treat.  Thank you, Dr. Posey Pronto  If you have any other questions please contact the internal medicine clinic at [phone]

## 2022-03-14 NOTE — Assessment & Plan Note (Addendum)
Blood pressure today is 115/60.  Patient is currently managed with lisinopril 10 mg daily.  Patient was recently started on metoprolol succinate 25 mg daily.  Patient denies any headache or vision changes.  She denies any lightheadedness or dizziness.  Blood pressure is well controlled.  Plan: -Follow-up in 3 months for blood pressure check -Continue lisinopril 10 mg daily -Continue metoprolol 25 mg daily (prescribed by cardiology for heart failure with reduced ejection fraction)

## 2022-03-15 ENCOUNTER — Inpatient Hospital Stay: Payer: Medicaid Other | Admitting: Hematology and Oncology

## 2022-03-15 ENCOUNTER — Inpatient Hospital Stay: Payer: Medicaid Other | Attending: Hematology and Oncology

## 2022-03-15 ENCOUNTER — Encounter: Payer: Self-pay | Admitting: Hematology and Oncology

## 2022-03-15 ENCOUNTER — Inpatient Hospital Stay: Payer: Medicaid Other

## 2022-03-15 DIAGNOSIS — C50412 Malignant neoplasm of upper-outer quadrant of left female breast: Secondary | ICD-10-CM

## 2022-03-15 DIAGNOSIS — Z7689 Persons encountering health services in other specified circumstances: Secondary | ICD-10-CM | POA: Diagnosis not present

## 2022-03-15 LAB — MICROSCOPIC EXAMINATION: Casts: NONE SEEN /lpf

## 2022-03-15 LAB — CERVICOVAGINAL ANCILLARY ONLY
Bacterial Vaginitis (gardnerella): POSITIVE — AB
Candida Glabrata: NEGATIVE
Candida Vaginitis: NEGATIVE
Chlamydia: NEGATIVE
Comment: NEGATIVE
Comment: NEGATIVE
Comment: NEGATIVE
Comment: NEGATIVE
Comment: NEGATIVE
Comment: NORMAL
Neisseria Gonorrhea: NEGATIVE
Trichomonas: NEGATIVE

## 2022-03-15 LAB — URINALYSIS, ROUTINE W REFLEX MICROSCOPIC
Bilirubin, UA: NEGATIVE
Glucose, UA: NEGATIVE
Ketones, UA: NEGATIVE
Nitrite, UA: NEGATIVE
Protein,UA: NEGATIVE
RBC, UA: NEGATIVE
Specific Gravity, UA: 1.018 (ref 1.005–1.030)
Urobilinogen, Ur: 0.2 mg/dL (ref 0.2–1.0)
pH, UA: 5.5 (ref 5.0–7.5)

## 2022-03-15 LAB — RPR: RPR Ser Ql: NONREACTIVE

## 2022-03-15 LAB — HIV ANTIBODY (ROUTINE TESTING W REFLEX): HIV Screen 4th Generation wRfx: NONREACTIVE

## 2022-03-15 MED ORDER — METRONIDAZOLE 500 MG PO TABS: 500.0000 mg | ORAL_TABLET | Freq: Two times a day (BID) | ORAL | 0 refills | Status: AC

## 2022-03-15 NOTE — Progress Notes (Signed)
UA showing slight bacteria and 1+ leukocytes but no nitrites.  Vaginal swab showing positive for bacterial vaginosis.  RPR negative.  HIV negative.  This is likely an active bacterial vaginosis infection.  Plan will be to treat with metronidazole 500 mg twice daily for next 7 days.  Patient informed results.  Patient is understanding about the plan.

## 2022-03-15 NOTE — Addendum Note (Signed)
Addended by: Leigh Aurora on: 03/15/2022 03:52 PM   Modules accepted: Orders

## 2022-03-16 ENCOUNTER — Telehealth: Payer: Self-pay | Admitting: Hematology and Oncology

## 2022-03-16 DIAGNOSIS — Z7689 Persons encountering health services in other specified circumstances: Secondary | ICD-10-CM | POA: Diagnosis not present

## 2022-03-16 NOTE — Telephone Encounter (Signed)
Scheduled appointment per 11/15 secure chat. Left voicemail.

## 2022-03-17 DIAGNOSIS — Z7689 Persons encountering health services in other specified circumstances: Secondary | ICD-10-CM | POA: Diagnosis not present

## 2022-03-18 ENCOUNTER — Other Ambulatory Visit: Payer: Self-pay

## 2022-03-18 DIAGNOSIS — Z7689 Persons encountering health services in other specified circumstances: Secondary | ICD-10-CM | POA: Diagnosis not present

## 2022-03-19 DIAGNOSIS — Z7689 Persons encountering health services in other specified circumstances: Secondary | ICD-10-CM | POA: Diagnosis not present

## 2022-03-20 DIAGNOSIS — Z7689 Persons encountering health services in other specified circumstances: Secondary | ICD-10-CM | POA: Diagnosis not present

## 2022-03-20 NOTE — Progress Notes (Signed)
Internal Medicine Clinic Attending  I saw and evaluated the patient.  I personally confirmed the key portions of the history and exam documented by the resident  and I reviewed pertinent patient test results.  The assessment, diagnosis, and plan were formulated together and I agree with the documentation in the resident's note.  

## 2022-03-21 DIAGNOSIS — Z7689 Persons encountering health services in other specified circumstances: Secondary | ICD-10-CM | POA: Diagnosis not present

## 2022-03-22 DIAGNOSIS — Z7689 Persons encountering health services in other specified circumstances: Secondary | ICD-10-CM | POA: Diagnosis not present

## 2022-03-24 DIAGNOSIS — Z7689 Persons encountering health services in other specified circumstances: Secondary | ICD-10-CM | POA: Diagnosis not present

## 2022-03-25 DIAGNOSIS — Z7689 Persons encountering health services in other specified circumstances: Secondary | ICD-10-CM | POA: Diagnosis not present

## 2022-03-26 DIAGNOSIS — Z7689 Persons encountering health services in other specified circumstances: Secondary | ICD-10-CM | POA: Diagnosis not present

## 2022-03-27 DIAGNOSIS — Z7689 Persons encountering health services in other specified circumstances: Secondary | ICD-10-CM | POA: Diagnosis not present

## 2022-03-28 ENCOUNTER — Other Ambulatory Visit: Payer: Self-pay | Admitting: Internal Medicine

## 2022-03-28 DIAGNOSIS — Z7689 Persons encountering health services in other specified circumstances: Secondary | ICD-10-CM | POA: Diagnosis not present

## 2022-03-28 NOTE — Telephone Encounter (Signed)
Not on current med list - States course completed.

## 2022-03-29 ENCOUNTER — Telehealth: Payer: Self-pay | Admitting: *Deleted

## 2022-03-29 DIAGNOSIS — Z7689 Persons encountering health services in other specified circumstances: Secondary | ICD-10-CM | POA: Diagnosis not present

## 2022-03-29 NOTE — Telephone Encounter (Signed)
Patient called office and requested refill of Ambien. She said the last prescription should have been for 60 pills, so she thinks the pharmacy might have made a mistake.   Review of chart: Dr. Lindi Adie escribed prescription for Ambien 5 mg, #30 with 1 refill on 02/07/22. Received by [place] at 1:09 pm 02/07/22. Dr. Lindi Adie informed of patient request and verified refill date via controlled substance database: most recent refill 03/06/22. [place] for last refill date. Per Pharmacy, this RX refilled on 03/06/22.   Per Dr. Lindi Adie: Inform patient that prescription for Ambien 5 mg, #30 for 30 day supply is correct. Dose is 1 5 mg tablet/night. Medication can be refilled on or after 04/05/22.  Contacted patient with above information. Patient states she thought she was supposed to get #60 tablets. Repeated Dr. Geralyn Flash response. Patient verbalized understanding and states maybe it was internal medicine that gave her that prescription and she will call them.

## 2022-03-30 ENCOUNTER — Other Ambulatory Visit: Payer: Self-pay

## 2022-03-31 DIAGNOSIS — Z7689 Persons encountering health services in other specified circumstances: Secondary | ICD-10-CM | POA: Diagnosis not present

## 2022-03-31 DIAGNOSIS — Z419 Encounter for procedure for purposes other than remedying health state, unspecified: Secondary | ICD-10-CM | POA: Diagnosis not present

## 2022-04-01 DIAGNOSIS — Z7689 Persons encountering health services in other specified circumstances: Secondary | ICD-10-CM | POA: Diagnosis not present

## 2022-04-01 NOTE — Progress Notes (Signed)
Patient Care Team: [name], DO as PCP - General (Internal Medicine) Claudia Pollock, RN as Registered Nurse Dayna Barker, MD as Consulting Physician (General Surgery) Ethelda Chick as Social Worker Gery Pray, MD as Consulting Physician (Radiation Oncology) [name], MD as Consulting Physician (Hematology and Oncology) Rolm Bookbinder, MD as Consulting Physician (General Surgery)  DIAGNOSIS: No diagnosis found.  SUMMARY OF ONCOLOGIC HISTORY: Oncology History  Left breast lump (Resolved)  Malignant neoplasm of upper-outer quadrant of left breast in female, estrogen receptor positive ([place])  12/17/2020 Initial Diagnosis   Palpable lump in the upper outer left breast. 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, Her2+ (3+)/ER+ (80%)/PR- (<1%) in the left breast, and invasive ductal carcinoma, Her2+ (3+)/ER+ (80%)/PR+ (3%) in the left axillary lymph node   12/23/2020 Cancer Staging   Staging form: Breast, AJCC 8th Edition - Clinical stage from 12/23/2020: Stage IB (cT2, cN1, cM0, G3, ER+, PR+, HER2+) - Signed by [name], MD on 12/23/2020 Stage prefix: Initial diagnosis Histologic grading system: 3 grade system   01/11/2021 - 04/29/2021 Chemotherapy   Patient is on Treatment Plan : BREAST  Docetaxel + Carboplatin + Trastuzumab + Pertuzumab  (TCHP) q21d      01/31/2021 Genetic Testing   Negative hereditary cancer genetic testing: no pathogenic variants detected in [facility] Common Hereditary Cancers +RNA Panel.  The report date is January 31, 2021.    The Common Hereditary Cancers + RNA Panel offered by [facility] includes sequencing, deletion/duplication, and RNA testing of the following 47 genes: APC, ATM, AXIN2, BARD1, BMPR1A, BRCA1, BRCA2, BRIP1, CDH1, CDK4*, CDKN2A  (p14ARF)*, CDKN2A (p16INK4a)*, CHEK2, CTNNA1, DICER1, EPCAM (Deletion/duplication testing only), GREM1 (promoter region deletion/duplication testing only), KIT, MEN1, MLH1, MSH2, MSH3, MSH6, MUTYH, NBN, NF1, NHTL1, PALB2, PDGFRA*, PMS2, POLD1, POLE, PTEN, RAD50, RAD51C, RAD51D, SDHB, SDHC, SDHD, SMAD4, SMARCA4. STK11, TP53, TSC1, TSC2, and VHL.  The following genes were evaluated for sequence changes only: SDHA and HOXB13 c.251G>A variant only.  RNA analysis is not performed for the * genes.     05/23/2021 Surgery   Left mastectomy: Negative for residual cancer.  Extensive residual high-grade DCIS, 0/4 lymph nodes negative Right mastectomy: Benign Complete pathologic response   06/08/2021 - 12/13/2021 Chemotherapy   Patient is on Treatment Plan : BREAST Trastuzumab  + Pertuzumab q21d x 13 cycles     06/16/2021 -  Chemotherapy   Patient is on Treatment Plan : BREAST Trastuzumab + Pertuzumab q21d     09/27/2021 - 11/25/2021 Radiation Therapy   Site Technique Total Dose (Gy) Dose per Fx (Gy) Completed Fx Beam Energies  Chest Wall, Left: CW_L 3D 50/50 2 25/25 6X, 10X  Sclav-LT: SCV_L 3D 50/50 2 25/25 6X, 10X  Chest Wall, Left: CW_L_Bst Electron 10/10 2 5/5 6E     11/2021 -  Anti-estrogen oral therapy   Tamoxifen     CHIEF COMPLIANT: Follow-up Herceptin Perjeta   INTERVAL HISTORY: Robin Horn is a  36 y.o. with above-mentioned history of left breast cancer, completed chemotherapy with TCHP. She presents to the clinic today for follow-up.    ALLERGIES:  is allergic to hydrocodone-acetaminophen, tramadol, and lactose intolerance (gi).  MEDICATIONS:  Current Outpatient Medications  Medication Sig Dispense Refill   lisinopril (ZESTRIL) 10 MG tablet Take 1 tablet (10 mg total) by mouth daily. 90 tablet 3  metoprolol succinate (TOPROL-XL) 25 MG 24 hr tablet Take 1 tablet (25 mg total) by mouth daily. 90 tablet 3   tamoxifen (NOLVADEX) 20 MG tablet Take 1 tablet (20 mg total) by mouth daily. 90  tablet 3   tiZANidine (ZANAFLEX) 4 MG tablet TAKE 1 TABLET BY MOUTH THREE TIMES DAILY 90 tablet 0   zolpidem (AMBIEN) 5 MG tablet Take 1 tablet (5 mg total) by mouth at bedtime as needed for sleep. 30 tablet 1   No current facility-administered medications for this visit.    PHYSICAL EXAMINATION: ECOG PERFORMANCE STATUS: {CHL ONC ECOG PS:[id number]}  There were no vitals filed for this visit. There were no vitals filed for this visit.  BREAST:*** No palpable masses or nodules in either right or left breasts. No palpable axillary supraclavicular or infraclavicular adenopathy no breast tenderness or nipple discharge. (exam performed in the presence of a chaperone)  LABORATORY DATA:  I have reviewed the data as listed    Latest Ref Rng & Units 01/24/2022   12:55 PM 12/13/2021    1:41 PM 10/31/2021   12:26 PM  CMP  Glucose 70 - 99 mg/dL 88  100  73   BUN 6 - 20 mg/dL _0 Creatinine 0.44 - 1.00 mg/dL 1.42  1.63  1.60   Sodium 135 - 145 mmol/L 139  141  142   Potassium 3.5 - 5.1 mmol/L 3.9  3.6  3.7   Chloride 98 - 111 mmol/L 104  109  107   CO2 22 - 32 mmol/L _1 Calcium 8.9 - 10.3 mg/dL 9.0  8.9  9.3   Total Protein 6.5 - 8.1 g/dL 6.9  6.7  6.9   Total Bilirubin 0.3 - 1.2 mg/dL 0.3  0.2  0.2   Alkaline Phos 38 - 126 U/L 47  68  62   AST 15 - 41 U/L _2 ALT 0 - 44 U/L _3 Lab Results  Component Value Date   WBC 6.7 01/24/2022   HGB 10.8 (L) 01/24/2022   HCT 32.7 (L) 01/24/2022   MCV 88.6 01/24/2022   PLT 307 01/24/2022   NEUTROABS 4.3 01/24/2022    ASSESSMENT & PLAN:  No problem-specific Assessment & Plan notes found for this encounter.    No orders of the defined types were placed in this encounter.  The patient has a good understanding of the overall plan. she agrees with it. she will call with any problems that may develop before the next visit here. Total time spent: 30 mins including face to face time and time spent for planning,  charting and co-ordination of care   Suzzette Righter, [place] 04/01/22    I Gardiner Coins am scribing for Dr. Lindi Adie  ***

## 2022-04-02 DIAGNOSIS — Z7689 Persons encountering health services in other specified circumstances: Secondary | ICD-10-CM | POA: Diagnosis not present

## 2022-04-03 DIAGNOSIS — Z7689 Persons encountering health services in other specified circumstances: Secondary | ICD-10-CM | POA: Diagnosis not present

## 2022-04-04 DIAGNOSIS — Z7689 Persons encountering health services in other specified circumstances: Secondary | ICD-10-CM | POA: Diagnosis not present

## 2022-04-05 ENCOUNTER — Other Ambulatory Visit: Payer: Self-pay | Admitting: Hematology and Oncology

## 2022-04-05 DIAGNOSIS — Z7689 Persons encountering health services in other specified circumstances: Secondary | ICD-10-CM | POA: Diagnosis not present

## 2022-04-05 MED ORDER — ZOLPIDEM TARTRATE 5 MG PO TABS: 5.0000 mg | ORAL_TABLET | Freq: Every evening | ORAL | 1 refills | Status: DC | PRN

## 2022-04-06 ENCOUNTER — Inpatient Hospital Stay: Payer: Medicaid Other | Attending: Hematology and Oncology | Admitting: Hematology and Oncology

## 2022-04-06 ENCOUNTER — Inpatient Hospital Stay: Payer: Medicaid Other

## 2022-04-06 ENCOUNTER — Other Ambulatory Visit: Payer: Self-pay

## 2022-04-06 VITALS — BP 153/93 | HR 76 | Temp 97.8°F | Resp 18 | Ht 62.0 in | Wt 150.5 lb

## 2022-04-06 DIAGNOSIS — R232 Flushing: Secondary | ICD-10-CM | POA: Diagnosis not present

## 2022-04-06 DIAGNOSIS — Z79811 Long term (current) use of aromatase inhibitors: Secondary | ICD-10-CM | POA: Insufficient documentation

## 2022-04-06 DIAGNOSIS — Z923 Personal history of irradiation: Secondary | ICD-10-CM | POA: Insufficient documentation

## 2022-04-06 DIAGNOSIS — Z7689 Persons encountering health services in other specified circumstances: Secondary | ICD-10-CM | POA: Diagnosis not present

## 2022-04-06 DIAGNOSIS — C50412 Malignant neoplasm of upper-outer quadrant of left female breast: Secondary | ICD-10-CM | POA: Insufficient documentation

## 2022-04-06 DIAGNOSIS — Z170 Estrogen receptor positive status [ER+]: Secondary | ICD-10-CM | POA: Diagnosis not present

## 2022-04-06 DIAGNOSIS — Z9221 Personal history of antineoplastic chemotherapy: Secondary | ICD-10-CM | POA: Diagnosis not present

## 2022-04-06 DIAGNOSIS — Z9013 Acquired absence of bilateral breasts and nipples: Secondary | ICD-10-CM | POA: Insufficient documentation

## 2022-04-06 LAB — CBC WITH DIFFERENTIAL (CANCER CENTER ONLY)
Abs Immature Granulocytes: 0.01 10*3/uL (ref 0.00–0.07)
Basophils Absolute: 0 10*3/uL (ref 0.0–0.1)
Basophils Relative: 1 %
Eosinophils Absolute: 0.9 10*3/uL — ABNORMAL HIGH (ref 0.0–0.5)
Eosinophils Relative: 16 %
HCT: 35.4 % — ABNORMAL LOW (ref 36.0–46.0)
Hemoglobin: 11.6 g/dL — ABNORMAL LOW (ref 12.0–15.0)
Immature Granulocytes: 0 %
Lymphocytes Relative: 24 %
Lymphs Abs: 1.4 10*3/uL (ref 0.7–4.0)
MCH: 29.1 pg (ref 26.0–34.0)
MCHC: 32.8 g/dL (ref 30.0–36.0)
MCV: 88.9 fL (ref 80.0–100.0)
Monocytes Absolute: 0.4 10*3/uL (ref 0.1–1.0)
Monocytes Relative: 7 %
Neutro Abs: 3 10*3/uL (ref 1.7–7.7)
Neutrophils Relative %: 52 %
Platelet Count: 249 10*3/uL (ref 150–400)
RBC: 3.98 MIL/uL (ref 3.87–5.11)
RDW: 13.8 % (ref 11.5–15.5)
WBC Count: 5.7 10*3/uL (ref 4.0–10.5)
nRBC: 0 % (ref 0.0–0.2)

## 2022-04-06 LAB — CMP (CANCER CENTER ONLY)
ALT: 8 U/L (ref 0–44)
AST: 12 U/L — ABNORMAL LOW (ref 15–41)
Albumin: 3.8 g/dL (ref 3.5–5.0)
Alkaline Phosphatase: 55 U/L (ref 38–126)
Anion gap: 7 (ref 5–15)
BUN: 14 mg/dL (ref 6–20)
CO2: 29 mmol/L (ref 22–32)
Calcium: 9.4 mg/dL (ref 8.9–10.3)
Chloride: 106 mmol/L (ref 98–111)
Creatinine: 1.4 mg/dL — ABNORMAL HIGH (ref 0.44–1.00)
GFR, Estimated: 50 mL/min — ABNORMAL LOW (ref >60)
Glucose, Bld: 93 mg/dL (ref 70–99)
Potassium: 3.6 mmol/L (ref 3.5–5.1)
Sodium: 142 mmol/L (ref 135–145)
Total Bilirubin: 0.2 mg/dL — ABNORMAL LOW (ref 0.3–1.2)
Total Protein: 6.8 g/dL (ref 6.5–8.1)

## 2022-04-06 MED ORDER — VENLAFAXINE HCL ER 37.5 MG PO CP24: 37.5000 mg | ORAL_CAPSULE | Freq: Every day | ORAL | 11 refills | Status: DC

## 2022-04-06 NOTE — Assessment & Plan Note (Addendum)
12/17/2020: Palpable lump in the upper outer left breast. 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 Neoadjuvant chemotherapy with TCH Perjeta 6 cycles completed 04/26/2021 followed by Herceptin Perjeta maintenance versus Kadcyla maintenance (based on response to neoadjuvant chemo) for 1 year completed 01/03/2022 2. Left mastectomy 05/23/2021: Negative for residual cancer.  Extensive residual high-grade DCIS, 0/4 lymph nodes negative; Right mastectomy: Benign  Complete pathologic response 3. Followed by adjuvant radiation therapy completed 11/25/2021 4.  Followed by adjuvant antiestrogen therapy -------------------------------------------------------------------------------------------------------------------------------------------------- Current treatment: Tamoxifen started 12/13/2021 Tamoxifen toxicities: Severe hot flashes: I sent a prescription for Effexor.  Breast cancer surveillance: Breast exam 04/06/2022: Benign.  Bilateral mastectomies.  She has expanders in place.  I instructed her to call plastic surgery and have them removed if she does not want reconstruction. No role of imaging study since she had bilateral mastectomies  Return to clinic in 1 year for follow-up

## 2022-04-07 ENCOUNTER — Other Ambulatory Visit: Payer: Self-pay

## 2022-04-07 DIAGNOSIS — Z7689 Persons encountering health services in other specified circumstances: Secondary | ICD-10-CM | POA: Diagnosis not present

## 2022-04-08 DIAGNOSIS — Z7689 Persons encountering health services in other specified circumstances: Secondary | ICD-10-CM | POA: Diagnosis not present

## 2022-04-09 DIAGNOSIS — Z7689 Persons encountering health services in other specified circumstances: Secondary | ICD-10-CM | POA: Diagnosis not present

## 2022-04-10 DIAGNOSIS — Z7689 Persons encountering health services in other specified circumstances: Secondary | ICD-10-CM | POA: Diagnosis not present

## 2022-04-11 DIAGNOSIS — Z7689 Persons encountering health services in other specified circumstances: Secondary | ICD-10-CM | POA: Diagnosis not present

## 2022-04-12 DIAGNOSIS — Z7689 Persons encountering health services in other specified circumstances: Secondary | ICD-10-CM | POA: Diagnosis not present

## 2022-04-14 DIAGNOSIS — Z7689 Persons encountering health services in other specified circumstances: Secondary | ICD-10-CM | POA: Diagnosis not present

## 2022-04-15 DIAGNOSIS — Z7689 Persons encountering health services in other specified circumstances: Secondary | ICD-10-CM | POA: Diagnosis not present

## 2022-05-01 DIAGNOSIS — Z419 Encounter for procedure for purposes other than remedying health state, unspecified: Secondary | ICD-10-CM | POA: Diagnosis not present

## 2022-05-04 ENCOUNTER — Encounter: Payer: Self-pay | Admitting: Hematology and Oncology

## 2022-05-11 ENCOUNTER — Other Ambulatory Visit: Payer: Self-pay | Admitting: Internal Medicine

## 2022-06-01 DIAGNOSIS — Z419 Encounter for procedure for purposes other than remedying health state, unspecified: Secondary | ICD-10-CM | POA: Diagnosis not present

## 2022-06-21 ENCOUNTER — Other Ambulatory Visit: Payer: Self-pay | Admitting: Internal Medicine

## 2022-06-30 DIAGNOSIS — Z419 Encounter for procedure for purposes other than remedying health state, unspecified: Secondary | ICD-10-CM | POA: Diagnosis not present

## 2022-07-10 ENCOUNTER — Other Ambulatory Visit: Payer: Self-pay | Admitting: Hematology and Oncology

## 2022-07-24 ENCOUNTER — Other Ambulatory Visit: Payer: Self-pay | Admitting: Internal Medicine

## 2022-07-24 ENCOUNTER — Other Ambulatory Visit: Payer: Self-pay | Admitting: Student

## 2022-07-24 NOTE — Telephone Encounter (Signed)
She will need another appointment before further refills, does not seem as though this has been addressed in a few months. Thanks

## 2022-07-31 DIAGNOSIS — Z419 Encounter for procedure for purposes other than remedying health state, unspecified: Secondary | ICD-10-CM | POA: Diagnosis not present

## 2022-08-02 ENCOUNTER — Other Ambulatory Visit: Payer: Self-pay

## 2022-08-02 MED ORDER — TIZANIDINE HCL 4 MG PO TABS: 4.0000 mg | ORAL_TABLET | Freq: Three times a day (TID) | ORAL | 0 refills | Status: DC

## 2022-08-02 NOTE — Telephone Encounter (Signed)
Tizandiine refilled but can discuss at follow up with Dr. Coy Saunas about continued muscle spasm and determine if needs further refills.

## 2022-08-08 ENCOUNTER — Encounter: Payer: Medicaid Other | Admitting: Student

## 2022-08-14 ENCOUNTER — Other Ambulatory Visit: Payer: Self-pay | Admitting: Hematology and Oncology

## 2022-08-30 ENCOUNTER — Ambulatory Visit (HOSPITAL_COMMUNITY): Payer: Medicaid Other | Attending: Cardiology

## 2022-08-30 DIAGNOSIS — Z419 Encounter for procedure for purposes other than remedying health state, unspecified: Secondary | ICD-10-CM | POA: Diagnosis not present

## 2022-09-01 ENCOUNTER — Encounter (HOSPITAL_COMMUNITY): Payer: Self-pay | Admitting: Cardiology

## 2022-09-07 ENCOUNTER — Other Ambulatory Visit: Payer: Self-pay | Admitting: Student

## 2022-09-07 ENCOUNTER — Other Ambulatory Visit: Payer: Self-pay | Admitting: Hematology and Oncology

## 2022-09-08 ENCOUNTER — Telehealth: Payer: Self-pay | Admitting: *Deleted

## 2022-09-08 DIAGNOSIS — F322 Major depressive disorder, single episode, severe without psychotic features: Secondary | ICD-10-CM

## 2022-09-08 NOTE — Telephone Encounter (Signed)
Patient's mom called in stating patient is very depressed. States this has been going on for years, but has worsened recently. 2 days ago patient texted mom stating she was tired and ready to kill herself. Yesterday she was "ok" and today she tells mom she is down and depressed. Patient had been seeing [facility], but states they moved and she hasn't been able to get in touch with them. Mom has offered to take patient to see [facility] but patient has 36 yo twins, a 40 yo daughter, and a daughter in [place] that she is fighting to regain custody. Patient's mom works so patient has no one to look after the children.  Mom was given number to [facility] Suicide Prevention Lifeline 4353732774  Also, discussed possible IVC by going to Magistrate at [facility] to initiate paperwork.  Discussed calling [place] non-emergency police line at [phone] and asking for Behavioral Response Team or Crisis Intervention Team.  Also, discussed taking patient to UC at [address] or [facility] across from [employer].  Mom will start with National Suicide Prevention Lifeline and go from there.

## 2022-09-08 NOTE — Telephone Encounter (Signed)
Ok thank you - I agree with all. Will place referral to [facility] as well.

## 2022-09-20 ENCOUNTER — Encounter: Payer: Self-pay | Admitting: Hematology and Oncology

## 2022-09-30 DIAGNOSIS — Z419 Encounter for procedure for purposes other than remedying health state, unspecified: Secondary | ICD-10-CM | POA: Diagnosis not present

## 2022-10-03 ENCOUNTER — Other Ambulatory Visit: Payer: Self-pay | Admitting: Internal Medicine

## 2022-10-17 ENCOUNTER — Other Ambulatory Visit: Payer: Self-pay | Admitting: Hematology and Oncology

## 2022-10-17 ENCOUNTER — Ambulatory Visit (INDEPENDENT_AMBULATORY_CARE_PROVIDER_SITE_OTHER): Payer: Medicaid Other | Admitting: Licensed Clinical Social Worker

## 2022-10-17 DIAGNOSIS — F419 Anxiety disorder, unspecified: Secondary | ICD-10-CM

## 2022-10-17 NOTE — BH Specialist Note (Signed)
Patient no-showed today's appointment; appointment was for Telephone visit at 9:30am  Behavioral Health Clinician (BHC from here on out)  attempted patient twice via Telephone number [phone]   BHC contacted patient from telephone number [phone]. BHC left a  VM.   Patient will need to reschedule appointment by calling Internal medicine center [phone].  Kaveon Blatz, MSW, LCSW-A She/Her Behavioral Health Clinician [facility]  Internal Medicine Center Direct Dial:[phone]  Fax [phone] Main Office Phone: [phone] [address] [place], [place] [address] Website: [facility]  Primary Care  [place], [place]    

## 2022-10-30 DIAGNOSIS — Z419 Encounter for procedure for purposes other than remedying health state, unspecified: Secondary | ICD-10-CM | POA: Diagnosis not present

## 2022-11-10 ENCOUNTER — Other Ambulatory Visit: Payer: Self-pay | Admitting: Internal Medicine

## 2022-11-10 ENCOUNTER — Other Ambulatory Visit: Payer: Self-pay | Admitting: Hematology and Oncology

## 2022-11-30 DIAGNOSIS — Z419 Encounter for procedure for purposes other than remedying health state, unspecified: Secondary | ICD-10-CM | POA: Diagnosis not present

## 2022-12-15 ENCOUNTER — Encounter: Payer: Self-pay | Admitting: Hematology and Oncology

## 2022-12-19 ENCOUNTER — Other Ambulatory Visit: Payer: Self-pay | Admitting: Internal Medicine

## 2022-12-19 ENCOUNTER — Other Ambulatory Visit: Payer: Self-pay | Admitting: Hematology and Oncology

## 2022-12-21 ENCOUNTER — Other Ambulatory Visit: Payer: Self-pay

## 2022-12-21 NOTE — Telephone Encounter (Signed)
Sending to you for refill request.

## 2022-12-25 MED ORDER — ZOLPIDEM TARTRATE 5 MG PO TABS: 5.0000 mg | ORAL_TABLET | Freq: Every evening | ORAL | 0 refills | Status: DC | PRN

## 2022-12-27 ENCOUNTER — Encounter: Payer: Self-pay | Admitting: Student

## 2022-12-27 ENCOUNTER — Ambulatory Visit (INDEPENDENT_AMBULATORY_CARE_PROVIDER_SITE_OTHER): Payer: Managed Care, Other (non HMO) | Admitting: Student

## 2022-12-27 ENCOUNTER — Other Ambulatory Visit: Payer: Self-pay

## 2022-12-27 VITALS — BP 96/54 | HR 102

## 2022-12-27 DIAGNOSIS — M62838 Other muscle spasm: Secondary | ICD-10-CM | POA: Diagnosis not present

## 2022-12-27 DIAGNOSIS — G4700 Insomnia, unspecified: Secondary | ICD-10-CM

## 2022-12-27 DIAGNOSIS — N921 Excessive and frequent menstruation with irregular cycle: Secondary | ICD-10-CM | POA: Diagnosis not present

## 2022-12-27 DIAGNOSIS — F419 Anxiety disorder, unspecified: Secondary | ICD-10-CM | POA: Diagnosis not present

## 2022-12-27 DIAGNOSIS — F32A Depression, unspecified: Secondary | ICD-10-CM

## 2022-12-27 DIAGNOSIS — F5105 Insomnia due to other mental disorder: Secondary | ICD-10-CM

## 2022-12-27 MED ORDER — SERTRALINE HCL 25 MG PO TABS: 25.0000 mg | ORAL_TABLET | Freq: Every day | ORAL | 2 refills | Status: DC

## 2022-12-27 MED ORDER — ZOLPIDEM TARTRATE 5 MG PO TABS: 5.0000 mg | ORAL_TABLET | Freq: Every evening | ORAL | 0 refills | Status: DC | PRN

## 2022-12-27 MED ORDER — METHOCARBAMOL 500 MG PO TABS: 500.0000 mg | ORAL_TABLET | Freq: Three times a day (TID) | ORAL | 0 refills | Status: AC

## 2022-12-27 MED ORDER — LIDOCAINE 4 % EX PTCH: 1.0000 | MEDICATED_PATCH | CUTANEOUS | 2 refills | Status: DC

## 2022-12-27 MED ORDER — HYDROXYZINE HCL 10 MG PO TABS: 10.0000 mg | ORAL_TABLET | Freq: Three times a day (TID) | ORAL | 0 refills | Status: DC | PRN

## 2022-12-27 NOTE — Assessment & Plan Note (Signed)
Patient has not been sleeping well at all.  Refilled her Ambien.
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
## 2022-12-27 NOTE — Assessment & Plan Note (Signed)
Patient reports she has been having more bleeding episodes.  These are painless bleeding episodes.  They have been off of her period.  She states they have worsened.  Plan: -Refer to gynecology

## 2022-12-27 NOTE — Assessment & Plan Note (Signed)
Patient reports worsening back pain.  She states it has been getting worse over the past few days.  She does report having a fall about 5 days ago where she landed on her right side.  She denies any radiation of this pain.  She states it is in her right lower back.  She reports head impact on her fall, but did not lose any consciousness.  She states the pain has been worse since the fall.  Patient has been on tizanidine, and this has not helped.  On exam, patient does have midline lumbar tenderness as well as right paraspinal muscle tenderness.  I do appreciate some hypertonicity on the right paraspinal muscles.  Negative straight leg raise.  Do not think this is radicular in nature.  Plan: -Start Robaxin -Discontinue tizanidine -Refer to physical therapy -Given concern for history of malignancy, will refer patient for MRI -Lidocaine patches provided

## 2022-12-27 NOTE — Patient Instructions (Addendum)
Robin Horn, Shannon you for allowing me to take part in your care today.  Here are your instructions.  1. Regarding your Back pain we are going to give you some robaxin. Please take as directed. I have also referred you to physical therapy and wrote you for some lidocaine patches.   2. Regarding your anxiety and depression. I sent in some Zoloft for you. Please start taking this daily. Do not miss any doses. I have also written for Hydroxyzine which is to help when you have any anxiety attack. Please take this when you have an anxiety attack and it should help with relaxing you.  [facility]: Address: [facility] at [facility], [address], [place], [place] [address] Phone: [phone]  [facility]: [address], [place], [place] [address] Phone: [phone]  3. For you menstrual bleeding, I will have you referred to OB/GYN. Please await phone call for the referral.     Thank you, Dr. Allena Katz  If you have any other questions please contact the internal medicine clinic at [phone]
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
## 2022-12-28 ENCOUNTER — Encounter: Payer: Self-pay | Admitting: Hematology and Oncology

## 2022-12-28 NOTE — Addendum Note (Signed)
Addended by: Modena Slater on: 12/28/2022 01:19 PM   Modules accepted: Orders

## 2022-12-31 DIAGNOSIS — Z419 Encounter for procedure for purposes other than remedying health state, unspecified: Secondary | ICD-10-CM | POA: Diagnosis not present

## 2023-01-02 NOTE — Progress Notes (Signed)
Internal Medicine Clinic Attending  Case discussed with the resident at the time of the visit.  We reviewed the resident's history and exam and pertinent patient test results.  I agree with the assessment, diagnosis, and plan of care documented in the resident's note.  

## 2023-01-09 ENCOUNTER — Other Ambulatory Visit: Payer: Self-pay | Admitting: Internal Medicine

## 2023-01-12 ENCOUNTER — Encounter: Payer: Self-pay | Admitting: Hematology and Oncology

## 2023-01-16 ENCOUNTER — Ambulatory Visit (INDEPENDENT_AMBULATORY_CARE_PROVIDER_SITE_OTHER): Payer: Commercial Managed Care - HMO | Admitting: Licensed Clinical Social Worker

## 2023-01-16 DIAGNOSIS — F32A Depression, unspecified: Secondary | ICD-10-CM

## 2023-01-16 DIAGNOSIS — F419 Anxiety disorder, unspecified: Secondary | ICD-10-CM

## 2023-01-16 NOTE — BH Specialist Note (Unsigned)
Patient no-showed today's appointment; appointment was for Telephone visit at 2:30pm  Behavioral Health Clinician [facility] from here on out)  attempted patient  via Telephone number [phone]    [facility] contacted patient from telephone number [phone]. BHC left a  VM.   Patient will need to reschedule appointment by calling Internal medicine center [phone].  Christen Butter, MSW, LCSW-A She/Her Behavioral Health Clinician [facility]  Internal Medicine Center Direct Dial:[phone]  Fax [phone] Main Office Phone: [phone] [address] [place] [place], [place] [address] Website: [facility]: [facility] Rhonda W Internal Medicine [facility]  [place], [place]  [place]

## 2023-01-22 ENCOUNTER — Other Ambulatory Visit: Payer: Self-pay | Admitting: Student

## 2023-01-23 ENCOUNTER — Ambulatory Visit (INDEPENDENT_AMBULATORY_CARE_PROVIDER_SITE_OTHER): Payer: Self-pay | Admitting: Licensed Clinical Social Worker

## 2023-01-23 DIAGNOSIS — F419 Anxiety disorder, unspecified: Secondary | ICD-10-CM

## 2023-01-23 DIAGNOSIS — F32A Depression, unspecified: Secondary | ICD-10-CM

## 2023-01-23 NOTE — BH Specialist Note (Signed)
Patient no-showed today's appointment; appointment was for Telephone visit at 10:00 am.    Behavioral Health Clinician [facility] from here on out)  attempted patient  via Telephone number [phone]    [facility] contacted patient from telephone number [phone]. [facility] left a  VM.      [facility] received information from PCP that Patient is a female with a history of depression. Mother reports patient's depression has worsened recently, with suicidal ideation expressed via text messages. Patient has 75-year-old twins, a 47 year old daughter, and is in a custody dispute for another child in [place]. Previous mental health provider [facility]) is no longer accessible. Mother was provided with the following resources and interventions: National Suicide Prevention Lifeline number 786-452-5286) Information on involuntary commitment process through local magistrate Contact information for local Behavioral Response Team/Crisis Intervention Team [phone]) Urgent care options: [address] and behavioral health facility near [facility] Mother plans to start by contacting the National Suicide Prevention Lifeline. Patient's reluctance to seek immediate help may be related to childcare responsibilities. Close monitoring and follow-up recommended due to expressed suicidal ideation and multiple psychosocial stressors   Christen Butter, MSW, LCSW-A She/Her Behavioral Health Clinician [facility]  Internal Medicine Center Direct Dial:[phone]  Fax [phone] Main Office Phone: [phone] [address] [place] [place], [place] [address] Website: [facility] Internal Medicine [facility]  [place], [place]  [place]

## 2023-01-26 ENCOUNTER — Encounter: Payer: Self-pay | Admitting: Hematology and Oncology

## 2023-01-30 DIAGNOSIS — Z419 Encounter for procedure for purposes other than remedying health state, unspecified: Secondary | ICD-10-CM | POA: Diagnosis not present

## 2023-02-22 ENCOUNTER — Other Ambulatory Visit: Payer: Self-pay | Admitting: Internal Medicine

## 2023-02-23 ENCOUNTER — Telehealth: Payer: Self-pay | Admitting: *Deleted

## 2023-02-23 ENCOUNTER — Ambulatory Visit (INDEPENDENT_AMBULATORY_CARE_PROVIDER_SITE_OTHER): Payer: Managed Care, Other (non HMO) | Admitting: Student

## 2023-02-23 ENCOUNTER — Encounter: Payer: Self-pay | Admitting: Hematology and Oncology

## 2023-02-23 ENCOUNTER — Other Ambulatory Visit: Payer: Self-pay

## 2023-02-23 ENCOUNTER — Encounter: Payer: Self-pay | Admitting: Student

## 2023-02-23 VITALS — BP 103/51 | HR 75 | Temp 98.5°F | Ht 61.0 in | Wt 148.5 lb

## 2023-02-23 DIAGNOSIS — G4700 Insomnia, unspecified: Secondary | ICD-10-CM | POA: Diagnosis not present

## 2023-02-23 DIAGNOSIS — N921 Excessive and frequent menstruation with irregular cycle: Secondary | ICD-10-CM

## 2023-02-23 DIAGNOSIS — I10 Essential (primary) hypertension: Secondary | ICD-10-CM | POA: Diagnosis not present

## 2023-02-23 DIAGNOSIS — M5440 Lumbago with sciatica, unspecified side: Secondary | ICD-10-CM

## 2023-02-23 DIAGNOSIS — M62838 Other muscle spasm: Secondary | ICD-10-CM

## 2023-02-23 DIAGNOSIS — K5900 Constipation, unspecified: Secondary | ICD-10-CM

## 2023-02-23 MED ORDER — POLYETHYLENE GLYCOL 3350 17 G PO PACK: 17.0000 g | PACK | Freq: Every day | ORAL | 0 refills | Status: DC

## 2023-02-23 MED ORDER — SENNOSIDES-DOCUSATE SODIUM 8.6-50 MG PO TABS: 1.0000 | ORAL_TABLET | Freq: Every evening | ORAL | 0 refills | Status: DC | PRN

## 2023-02-23 MED ORDER — TIZANIDINE HCL 4 MG PO TABS: 4.0000 mg | ORAL_TABLET | Freq: Three times a day (TID) | ORAL | 0 refills | Status: DC | PRN

## 2023-02-23 NOTE — Telephone Encounter (Signed)
Call from pt wanting to know why Tizanidine was not refilled. Informed pt it was discontinued on 8/28 b/c "Patient has been on tizanidine, and this has not helped." And started on Robaxin and referred to PT. She states Robaxin has not helped either. Pt requesting an appt  to discuss medication - appt schedule today @ 0945 AM with Dr Colbert Coyer.

## 2023-02-23 NOTE — Patient Instructions (Signed)
Thank you, Ms.Baeleigh Zhong Golebiewski for allowing [place] to provide your care today. Today we discussed your back pain, nausea, and heavy vaginal bleeding.   I have ordered the following labs for you:  Lab Orders         CBC no Diff         TSH         BMP8+Anion Gap       Tests ordered today:  - none  Referrals ordered today:   Referral Orders  No referral(s) requested today     I have ordered the following medication/changed the following medications:   Stop the following medications: There are no discontinued medications.   Start the following medications: Meds ordered this encounter  Medications   tiZANidine (ZANAFLEX) 4 MG tablet    Sig: Take 1 tablet (4 mg total) by mouth 3 (three) times daily as needed for muscle spasms.    Dispense:  90 tablet    Refill:  0     Follow up: 2-4 weeks (earlier if MRI is done)    Remember:   - I will call you with your lab results.   - For your constipation: please take miralax 17 g daily, if not bowel movement in 2-3 days double up and take twice a day. Please take senna to make bowel movement softer. If you are having worsening nausea or any rectal bleeding please call us.   - For your lower blood pressures while on your menstrual period: please hold your metoprolol and lisinopril until you are done having it.   - For your back, you will get another call to get your MRI of your back. PLEASE STOP your robaxin and only take the tizanidine 4 mg three times a day as needed for back pain.   Should you have any questions or concerns please call the internal medicine clinic at [phone].     Erron Wengert Colbert Coyer, MD PGY-1 Internal Medicine Teaching Progam [facility] Internal Medicine Center

## 2023-02-24 ENCOUNTER — Encounter: Payer: Self-pay | Admitting: Hematology and Oncology

## 2023-02-25 ENCOUNTER — Encounter: Payer: Self-pay | Admitting: Hematology and Oncology

## 2023-02-25 DIAGNOSIS — K5900 Constipation, unspecified: Secondary | ICD-10-CM | POA: Insufficient documentation

## 2023-02-25 LAB — BMP8+ANION GAP
Anion Gap: 16 mmol/L (ref 10.0–18.0)
BUN/Creatinine Ratio: 11 (ref 9–23)
BUN: 18 mg/dL (ref 6–20)
CO2: 23 mmol/L (ref 20–29)
Calcium: 9.1 mg/dL (ref 8.7–10.2)
Chloride: 103 mmol/L (ref 96–106)
Creatinine, Ser: 1.68 mg/dL — ABNORMAL HIGH (ref 0.57–1.00)
Glucose: 70 mg/dL (ref 70–99)
Potassium: 4.1 mmol/L (ref 3.5–5.2)
Sodium: 142 mmol/L (ref 134–144)
eGFR: 40 mL/min/{1.73_m2} — ABNORMAL LOW (ref >59)

## 2023-02-25 LAB — CBC
Hematocrit: 37.2 % (ref 34.0–46.6)
Hemoglobin: 11.6 g/dL (ref 11.1–15.9)
MCH: 28.2 pg (ref 26.6–33.0)
MCHC: 31.2 g/dL — ABNORMAL LOW (ref 31.5–35.7)
MCV: 91 fL (ref 79–97)
Platelets: 276 10*3/uL (ref 150–450)
RBC: 4.11 x10E6/uL (ref 3.77–5.28)
RDW: 13.6 % (ref 11.7–15.4)
WBC: 10 10*3/uL (ref 3.4–10.8)

## 2023-02-25 LAB — TSH: TSH: 3.3 u[IU]/mL (ref 0.450–4.500)

## 2023-02-25 NOTE — Assessment & Plan Note (Addendum)
Patient with chronic constipation. Endorses straining with recent bowel movements and saw some blood on toilet paper. Declined DRE today, willing to increase BM regimen and will call clinic if there is increased bleeding or there is no improvement with her constipation after implementing changes.  - Take miralax 17 g daily, take twice a day if no bowel movement in 2-3 days, continue to take twice a day until she has a BM  - Take senokot as needed at bedtime - Will get CBC, follow up as needed

## 2023-02-25 NOTE — Assessment & Plan Note (Signed)
Patient continues to endorse difficulty sleeping at night. Stays very busy with two twin 36-year-old boys. Takes Ambien sometimes, helps some. Discussed sedating events of tizanidine. Will monitor.  - Continue taking ambien 5 mg at bedtime as needed

## 2023-02-25 NOTE — Assessment & Plan Note (Addendum)
Patient seen 8/28 for back pain following a fall, referred for MRI and PT, discontinued tizanidine, and started robaxin 500 mg BID. Patient continues to endorse midline back pain that can radiate up to the neck. Has to remain active for her twin sons but pain makes it very difficult to get things done. States she has not been contacted for the MRI or PT. Feels that tizanidine was more helpful than robaxin. Denies red flag symptoms such as saddle anesthesia, fecal or urinary incontinence, or weakness. On physical exam, pain reproducible with palpation along midline lower back.  - Discontinue robaxin 500 mg BID, START tizanidine 4 mg TID as needed, counseled on sedating effects of tizanidine - With history of malignancy and continued pain, will follow up on referrals to MRI and physical therapy - Will follow up on back pain and MRI in 2-4 weeks

## 2023-02-25 NOTE — Assessment & Plan Note (Signed)
Patient with history of increased bleeding with cycle. Patient previously referred to [facility]. Has upcoming appointment November 1st 2024. Will monitor.

## 2023-02-25 NOTE — Assessment & Plan Note (Signed)
Patient taking metoprolol succinate 25 mg daily and lisinopril 10 mg daily. BP today 103/51. Patient endorses feeling unwell due to back pain and life stressors. Denies weakness but feels fatigued due to her insomnia. Discussed holding BP medications for a few days until she feels better.  - Will get BMP, CBC, TSH today, follow up as needed

## 2023-02-27 ENCOUNTER — Other Ambulatory Visit: Payer: Self-pay | Admitting: Student

## 2023-02-27 DIAGNOSIS — R7989 Other specified abnormal findings of blood chemistry: Secondary | ICD-10-CM

## 2023-02-27 DIAGNOSIS — I10 Essential (primary) hypertension: Secondary | ICD-10-CM

## 2023-03-02 ENCOUNTER — Encounter: Payer: Managed Care, Other (non HMO) | Admitting: Obstetrics and Gynecology

## 2023-03-02 ENCOUNTER — Encounter: Payer: Self-pay | Admitting: Hematology and Oncology

## 2023-03-02 DIAGNOSIS — Z419 Encounter for procedure for purposes other than remedying health state, unspecified: Secondary | ICD-10-CM | POA: Diagnosis not present

## 2023-03-02 NOTE — Addendum Note (Signed)
Addended by: Dickie La on: 03/02/2023 03:20 PM   Modules accepted: Level of Service

## 2023-03-06 NOTE — Progress Notes (Signed)
Internal Medicine Clinic Attending  I was physically present during the key portions of the resident provided service and participated in the medical decision making of patient's management care. I reviewed pertinent patient test results.  The assessment, diagnosis, and plan were formulated together and I agree with the documentation in the resident's note.  Reymundo Poll, MD

## 2023-03-08 ENCOUNTER — Other Ambulatory Visit: Payer: Medicaid Other

## 2023-03-22 ENCOUNTER — Encounter: Payer: Self-pay | Admitting: Hematology and Oncology

## 2023-03-23 ENCOUNTER — Other Ambulatory Visit: Payer: Self-pay

## 2023-03-24 ENCOUNTER — Other Ambulatory Visit: Payer: Self-pay | Admitting: Student

## 2023-03-30 ENCOUNTER — Encounter: Payer: Self-pay | Admitting: Hematology and Oncology

## 2023-04-01 DIAGNOSIS — Z419 Encounter for procedure for purposes other than remedying health state, unspecified: Secondary | ICD-10-CM | POA: Diagnosis not present

## 2023-04-04 NOTE — Assessment & Plan Note (Addendum)
12/17/2020: Palpable lump in the upper outer left breast. 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 Neoadjuvant chemotherapy with TCH Perjeta 6 cycles completed 04/26/2021 followed by Herceptin Perjeta maintenance versus Kadcyla maintenance (based on response to neoadjuvant chemo) for 1 year completed 01/03/2022 2. Left mastectomy 05/23/2021: Negative for residual cancer.  Extensive residual high-grade DCIS, 0/4 lymph nodes negative; Right mastectomy: Benign  Complete pathologic response 3. Followed by adjuvant radiation therapy completed 11/25/2021 4.  Followed by adjuvant antiestrogen therapy -------------------------------------------------------------------------------------------------------------------------------------------------- Current treatment: Tamoxifen started 12/13/2021 Tamoxifen toxicities: Severe hot flashes:Advised to lower her Tamoxifen dose   Breast cancer surveillance: Breast exam 04/06/2023: Benign.  Bilateral mastectomies.  She has expanders in place.  I instructed her to call plastic surgery and have them removed if she does not want reconstruction. No role of imaging study since she had bilateral mastectomies   She is doing extremely busy with going back to school as well as working part-time as a Merchandiser, retail.  She has twins as well. Return to clinic in 1 year for follow-up

## 2023-04-06 ENCOUNTER — Inpatient Hospital Stay: Payer: Commercial Managed Care - HMO | Attending: Hematology and Oncology | Admitting: Hematology and Oncology

## 2023-04-06 ENCOUNTER — Other Ambulatory Visit (HOSPITAL_COMMUNITY): Payer: Self-pay

## 2023-04-06 ENCOUNTER — Inpatient Hospital Stay: Payer: Commercial Managed Care - HMO

## 2023-04-06 ENCOUNTER — Encounter: Payer: Self-pay | Admitting: Hematology and Oncology

## 2023-04-06 VITALS — BP 143/96 | HR 83 | Temp 98.4°F | Resp 18 | Ht 61.0 in | Wt 149.7 lb

## 2023-04-06 DIAGNOSIS — Z79810 Long term (current) use of selective estrogen receptor modulators (SERMs): Secondary | ICD-10-CM | POA: Diagnosis not present

## 2023-04-06 DIAGNOSIS — Z923 Personal history of irradiation: Secondary | ICD-10-CM | POA: Diagnosis not present

## 2023-04-06 DIAGNOSIS — Z113 Encounter for screening for infections with a predominantly sexual mode of transmission: Secondary | ICD-10-CM | POA: Diagnosis not present

## 2023-04-06 DIAGNOSIS — C50412 Malignant neoplasm of upper-outer quadrant of left female breast: Secondary | ICD-10-CM | POA: Diagnosis present

## 2023-04-06 DIAGNOSIS — N898 Other specified noninflammatory disorders of vagina: Secondary | ICD-10-CM | POA: Diagnosis not present

## 2023-04-06 DIAGNOSIS — Z9221 Personal history of antineoplastic chemotherapy: Secondary | ICD-10-CM | POA: Diagnosis not present

## 2023-04-06 DIAGNOSIS — Z9013 Acquired absence of bilateral breasts and nipples: Secondary | ICD-10-CM | POA: Insufficient documentation

## 2023-04-06 DIAGNOSIS — Z170 Estrogen receptor positive status [ER+]: Secondary | ICD-10-CM | POA: Insufficient documentation

## 2023-04-06 LAB — WET PREP, GENITAL
Clue Cells Wet Prep HPF POC: NONE SEEN
Sperm: NONE SEEN
Trich, Wet Prep: NONE SEEN
WBC, Wet Prep HPF POC: <10 (ref <10)
Yeast Wet Prep HPF POC: NONE SEEN

## 2023-04-06 MED ORDER — MEDROXYPROGESTERONE ACETATE 150 MG/ML IM SUSP: 150.0000 mg | INTRAMUSCULAR | 3 refills | Status: DC

## 2023-04-06 MED ORDER — TAMOXIFEN CITRATE 20 MG PO TABS
20.0000 mg | ORAL_TABLET | Freq: Every day | ORAL | 3 refills | Status: DC
  Filled 2023-04-06 – 2023-09-24 (×4): qty 90, 90d supply, fill #0

## 2023-04-06 NOTE — Progress Notes (Signed)
Patient Care Team: Champ Mungo, DO as PCP - General (Internal Medicine) Sharlyn Bologna, RN as Registered Nurse Knute Neu, MD as Consulting Physician (General Surgery) Shaune Leeks as Social Worker Antony Blackbird, MD as Consulting Physician (Radiation Oncology) Serena Croissant, MD as Consulting Physician (Hematology and Oncology) Emelia Loron, MD as Consulting Physician (General Surgery)  DIAGNOSIS:  Encounter Diagnoses  Name Primary?   Malignant neoplasm of upper-outer quadrant of left breast in female, estrogen receptor positive (HCC) Yes   Vaginal discharge     SUMMARY OF ONCOLOGIC HISTORY: Oncology History  Left breast lump (Resolved)  Malignant neoplasm of upper-outer quadrant of left breast in female, estrogen receptor positive (HCC)  12/17/2020 Initial Diagnosis   Palpable lump in the upper outer left breast. 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, Her2+ (3+)/ER+ (80%)/PR- (<1%) in the left breast, and invasive ductal carcinoma, Her2+ (3+)/ER+ (80%)/PR+ (3%) in the left axillary lymph node   12/23/2020 Cancer Staging   Staging form: Breast, AJCC 8th Edition - Clinical stage from 12/23/2020: Stage IB (cT2, cN1, cM0, G3, ER+, PR+, HER2+) - Signed by Serena Croissant, MD on 12/23/2020 Stage prefix: Initial diagnosis Histologic grading system: 3 grade system   01/11/2021 - 04/29/2021 Chemotherapy   Patient is on Treatment Plan : BREAST  Docetaxel + Carboplatin + Trastuzumab + Pertuzumab  (TCHP) q21d      01/31/2021 Genetic Testing   Negative hereditary cancer genetic testing: no pathogenic variants detected in [facility] Common Hereditary Cancers +RNA Panel.  The report date is January 31, 2021.    The Common Hereditary Cancers + RNA Panel offered by [facility]  includes sequencing, deletion/duplication, and RNA testing of the following 47 genes: APC, ATM, AXIN2, BARD1, BMPR1A, BRCA1, BRCA2, BRIP1, CDH1, CDK4*, CDKN2A (p14ARF)*, CDKN2A (p16INK4a)*, CHEK2, CTNNA1, DICER1, EPCAM (Deletion/duplication testing only), GREM1 (promoter region deletion/duplication testing only), KIT, MEN1, MLH1, MSH2, MSH3, MSH6, MUTYH, NBN, NF1, NHTL1, PALB2, PDGFRA*, PMS2, POLD1, POLE, PTEN, RAD50, RAD51C, RAD51D, SDHB, SDHC, SDHD, SMAD4, SMARCA4. STK11, TP53, TSC1, TSC2, and VHL.  The following genes were evaluated for sequence changes only: SDHA and HOXB13 c.251G>A variant only.  RNA analysis is not performed for the * genes.     05/23/2021 Surgery   Left mastectomy: Negative for residual cancer.  Extensive residual high-grade DCIS, 0/4 lymph nodes negative Right mastectomy: Benign Complete pathologic response   06/08/2021 - 12/13/2021 Chemotherapy   Patient is on Treatment Plan : BREAST Trastuzumab  + Pertuzumab q21d x 13 cycles     06/16/2021 -  Chemotherapy   Patient is on Treatment Plan : BREAST Trastuzumab + Pertuzumab q21d     09/27/2021 - 11/25/2021 Radiation Therapy   Site Technique Total Dose (Gy) Dose per Fx (Gy) Completed Fx Beam Energies  Chest Wall, Left: CW_L 3D 50/50 2 25/25 6X, 10X  Sclav-LT: SCV_L 3D 50/50 2 25/25 6X, 10X  Chest Wall, Left: CW_L_Bst Electron 10/10 2 5/5 6E     11/2021 -  Anti-estrogen oral therapy   Tamoxifen     CHIEF COMPLIANT: Complains of vaginal discharge  HISTORY OF PRESENT ILLNESS:  History of Present Illness   The patient, with a history of breast cancer, is currently on tamoxifen. She reports severe hot flashes, which she describes as hitting hard and being difficult to manage. She is also on Effexor for the hot  flashes. The patient also reports back pain, which has been present since her cancer treatments. She was told she has scoliosis. She was prescribed lidocaine patches for the pain, but she was unable to get them due to  financial constraints.  The patient also reports severe anxiety and panic attacks, which she describes as being so severe that she locks herself in a room, cries a lot, and has difficulty sleeping. She reports walking around in circles and sometimes standing up asleep. She has seen a counselor in the past for these issues.  The patient also reports constipation, which she describes as being very severe. She was prescribed a stool softener, but she never received it. She is also on Zoloft for her mental health issues.  The patient is also seeking a Depo-Provera shot for contraception and relief from severe menstrual periods. She reports that her periods have returned and are very severe since she stopped the Depo-Provera shot.         ALLERGIES:  is allergic to hydrocodone-acetaminophen, tramadol, and lactose intolerance (gi).  MEDICATIONS:  Current Outpatient Medications  Medication Sig Dispense Refill   medroxyPROGESTERone (DEPO-PROVERA) 150 MG/ML injection Inject 1 mL (150 mg total) into the muscle every 3 (three) months. 1 mL 3   lisinopril (ZESTRIL) 10 MG tablet Take 1 tablet by mouth once daily 90 tablet 0   senna-docusate (SENOKOT S) 8.6-50 MG tablet Take 1 tablet by mouth at bedtime as needed for mild constipation. 30 tablet 0   sertraline (ZOLOFT) 25 MG tablet Take 1 tablet (25 mg total) by mouth daily. 30 tablet 2   tamoxifen (NOLVADEX) 20 MG tablet Take 1 tablet (20 mg total) by mouth daily. 90 tablet 3   tiZANidine (ZANAFLEX) 4 MG tablet Take 1 tablet (4 mg total) by mouth 3 (three) times daily as needed for muscle spasms. 90 tablet 0   zolpidem (AMBIEN) 5 MG tablet TAKE 1 TABLET BY MOUTH AT BEDTIME AS NEEDED FOR SLEEP 30 tablet 0   No current facility-administered medications for this visit.    PHYSICAL EXAMINATION: ECOG PERFORMANCE STATUS: 1 - Symptomatic but completely ambulatory  Vitals:   04/06/23 0954  BP: (!) 143/96  Pulse: 83  Resp: 18  Temp: 98.4 F (36.9 C)   SpO2: 100%   Filed Weights   04/06/23 0954  Weight: 149 lb 11.2 oz (67.9 kg)      LABORATORY DATA:  I have reviewed the data as listed    Latest Ref Rng & Units 02/23/2023   11:26 AM 04/06/2022    9:24 AM 01/24/2022   12:55 PM  CMP  Glucose 70 - 99 mg/dL 70  93  88   BUN 6 - 20 mg/dL 18  14  13    Creatinine 0.57 - 1.00 mg/dL 9.60  4.54  0.98   Sodium 134 - 144 mmol/L 142  142  139   Potassium 3.5 - 5.2 mmol/L 4.1  3.6  3.9   Chloride 96 - 106 mmol/L 103  106  104   CO2 20 - 29 mmol/L 23  29  31    Calcium 8.7 - 10.2 mg/dL 9.1  9.4  9.0   Total Protein 6.5 - 8.1 g/dL  6.8  6.9   Total Bilirubin 0.3 - 1.2 mg/dL  0.2  0.3   Alkaline Phos 38 - 126 U/L  55  47   AST 15 - 41 U/L  12  12   ALT 0 - 44 U/L  8  8  Lab Results  Component Value Date   WBC 10.0 02/23/2023   HGB 11.6 02/23/2023   HCT 37.2 02/23/2023   MCV 91 02/23/2023   PLT 276 02/23/2023   NEUTROABS 3.0 04/06/2022    ASSESSMENT & PLAN:  Malignant neoplasm of upper-outer quadrant of left breast in female, estrogen receptor positive (HCC) 12/17/2020: Palpable lump in the upper outer left breast. 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 Neoadjuvant chemotherapy with TCH Perjeta 6 cycles completed 04/26/2021 followed by Herceptin Perjeta maintenance versus Kadcyla maintenance (based on response to neoadjuvant chemo) for 1 year completed 01/03/2022 2. Left mastectomy 05/23/2021: Negative for residual cancer.  Extensive residual high-grade DCIS, 0/4 lymph nodes negative; Right mastectomy: Benign  Complete pathologic response 3.  Followed by adjuvant radiation therapy completed 11/25/2021 4.  Followed by adjuvant antiestrogen therapy -------------------------------------------------------------------------------------------------------------------------------------------------- Current treatment: Tamoxifen started 12/13/2021 (reduced to 10 mg a day on 04/06/2023) Tamoxifen toxicities: Severe hot flashes:Advised to lower her Tamoxifen dose Mood swings Anxiety and panic attacks   Breast cancer surveillance: Breast exam 04/06/2023: Benign.  Bilateral mastectomies.  She has expanders in place.  I instructed her to call plastic surgery and have them removed if she does not want reconstruction. No role of imaging study since she had bilateral mastectomies   She is doing extremely busy with going back to school as well as working part-time as a Merchandiser, retail.  She has twins as well. Return to clinic in 1 year for follow-up  Menstrual Regulation Patient requests Depo-Provera shots for menstrual regulation. -Send prescription for Depo-Provera to [facility] pharmacy.  Breast Reconstruction Patient expresses interest in breast reconstruction post-mastectomy. -Refer to Dr. Arita Miss for consultation.  Possible Genitourinary Infection Patient reports recent unprotected sexual activity and suspects possible infection. -Perform pelvic exam and send swab for testing.  Mardella Layman performed this in the clinic and she will call the patient and discuss results.  Pharmacy Issues Patient reports issues with current pharmacy [facility]) and expresses interest in switching to [facility]. -Transfer prescriptions to [facility] and request home delivery.  General Health Maintenance -Continue current medications including Effexor for hot flashes, Zoloft for mental health, and Tamoxifen for breast cancer. -Check-in with internal medicine on 04/09/2023.          Orders Placed This Encounter  Procedures   POCT Wet Prep [facility])   The patient has a good understanding of the overall plan. she agrees with it. she will call with any problems that may develop before the next visit here. Total time spent: 30 mins including face to face time and time spent for planning, charting and co-ordination of care   Tamsen Meek, MD 04/06/23

## 2023-04-06 NOTE — Progress Notes (Signed)
S: Examined patient at request of Dr. Pamelia Hoit for vaginal discharge.    O:  Labia inner and outer normal, no lesions noted, + white thick vaginal discharge noted on exam. Examined with speculum: +thick white discharge, wet prep and G/C collected  A/P:  Vaginal discharge: obtained wet prep, G/C testing.  Will call patient with results.    Lillard Anes, NP 04/06/23 11:25 AM Medical Oncology and Hematology [facility] [address] [place], [place] [address] Tel. [phone]    Fax. [phone]

## 2023-04-08 ENCOUNTER — Other Ambulatory Visit: Payer: Self-pay

## 2023-04-09 ENCOUNTER — Telehealth: Payer: Self-pay

## 2023-04-09 ENCOUNTER — Inpatient Hospital Stay: Admission: RE | Admit: 2023-04-09 | Payer: Managed Care, Other (non HMO) | Source: Ambulatory Visit

## 2023-04-09 LAB — GC/CHLAMYDIA PROBE AMP (~~LOC~~) NOT AT ARMC
Chlamydia: NEGATIVE
Comment: NEGATIVE
Comment: NORMAL
Neisseria Gonorrhea: NEGATIVE

## 2023-04-09 NOTE — Telephone Encounter (Signed)
Pt called to let us know she received call from radiology advising she is not able to have LS MRI d/t tissue expanders. She called to verify if this information is correct. We assured her this is correct and she was advised to contact Dr Eliane Decree office for further recommendation. She verbalized thanks and understanding.

## 2023-04-09 NOTE — Telephone Encounter (Addendum)
Called pt. Per Np with message below. Pt states that she doesn't have any continued issues and that she was doing fine.----- Message from Noreene Filbert sent at 04/09/2023 11:23 AM EST ----- Testing was negative. Please see how she is doing today--if contiued issues, would send her to GYN ----- Message ----- From: Interface, Lab In [place] Sent: 04/06/2023  11:42 AM EST To: Loa Socks, NP

## 2023-04-16 ENCOUNTER — Other Ambulatory Visit (HOSPITAL_COMMUNITY): Payer: Self-pay

## 2023-04-19 ENCOUNTER — Other Ambulatory Visit: Payer: Self-pay

## 2023-04-19 ENCOUNTER — Encounter: Payer: Self-pay | Admitting: Hematology and Oncology

## 2023-04-20 MED ORDER — TIZANIDINE HCL 4 MG PO TABS: 4.0000 mg | ORAL_TABLET | Freq: Three times a day (TID) | ORAL | 0 refills | Status: DC | PRN

## 2023-04-28 ENCOUNTER — Ambulatory Visit
Admission: EM | Admit: 2023-04-28 | Discharge: 2023-04-28 | Disposition: A | Discharge status: Home / Self Care | Payer: Commercial Managed Care - HMO | Attending: Physician Assistant | Admitting: Physician Assistant

## 2023-04-28 DIAGNOSIS — Z113 Encounter for screening for infections with a predominantly sexual mode of transmission: Secondary | ICD-10-CM | POA: Diagnosis not present

## 2023-04-28 DIAGNOSIS — Z202 Contact with and (suspected) exposure to infections with a predominantly sexual mode of transmission: Secondary | ICD-10-CM

## 2023-04-28 MED ORDER — DOXYCYCLINE HYCLATE 100 MG PO CAPS: 100.0000 mg | ORAL_CAPSULE | Freq: Two times a day (BID) | ORAL | 0 refills | Status: DC

## 2023-04-28 NOTE — ED Provider Notes (Signed)
[facility]    CSN: [id number] Arrival date & time: 04/28/23  1035      History   Chief Complaint No chief complaint on file.   HPI Robin Horn is a 37 y.o. female.   Patient presents today requesting STI testing and treatment for chlamydia.  Reports that she had a new relationship with someone who is since tested positive for chlamydia.  She was sexually active with his female partner but did not use a condom.  She was tested on 04/06/2023 and was negative for GC chlamydia.  She denies any symptoms including pelvic pain, abdominal pain, vaginal discharge, fever, nausea, vomiting.  She has no concern for pregnancy.  She denies any recent antibiotic use.    Past Medical History:  Diagnosis Date   Abnormal Pap smear of cervix    Anemia    Anxiety    Arthritis    Bipolar disorder (HCC)    diagnosed years ago   Cancer (HCC)    Chlamydia    Depression    Drug use affecting pregnancy, antepartum 05/25/2016   History of cocaine use   Encounter for dental examination 04/12/2021   Family history of breast cancer 01/18/2021   GDM (gestational diabetes mellitus)    GERD (gastroesophageal reflux disease)    pt used to take omeprazole, doesn't have much issues with it anymore   Gonorrhea    Herpes genitalia    History of hiatal hernia    History of postpartum hemorrhage, currently pregnant 05/25/2016   History of radiation therapy    Left Chest wall- 09/27/21-11/25/21- Dr. Antony Blackbird   Migraines    Ovarian cyst    Polycystic kidney disease    Positive GBS test 09/23/2016   Preeclampsia    Pregnancy affected by fetal growth restriction 09/20/2016   Baby B. Getting weekly dopplers, AFI.    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   Port-A-Cath in place 01/18/2021   Malignant neoplasm of upper-outer quadrant of left breast in female, estrogen receptor positive (HCC) 12/23/2020   Hypertension 11/15/2020   Bipolar 1 disorder (HCC) 05/25/2016   Insomnia 05/25/2016    Past Surgical History:  Procedure Laterality Date   BREAST RECONSTRUCTION WITH PLACEMENT OF TISSUE EXPANDER AND FLEX HD (ACELLULAR HYDRATED DERMIS) Bilateral 05/23/2021   Procedure: BREAST RECONSTRUCTION WITH PLACEMENT OF TISSUE EXPANDER AND FLEX HD (ACELLULAR HYDRATED DERMIS);  Surgeon: Allena Napoleon, MD;  Location: [facility] OR;  Service: Plastics;  Laterality: Bilateral;   COLPOSCOPY     DILATION AND CURETTAGE OF UTERUS     MASTECTOMY W/ SENTINEL NODE BIOPSY Left 05/23/2021   Procedure: LEFT MASTECTOMY WITH LEFT AXILLARY SENTINEL LYMPH NODE BIOPSY;  Surgeon: Emelia Loron, MD;  Location: [facility] OR;  Service: General;  Laterality: Left;   PORT A CATH REVISION Right 01/31/2021   Procedure: PORT A CATH REVISION;  Surgeon: Emelia Loron, MD;  Location: [facility];  Service: General;  Laterality: Right;   PORTACATH PLACEMENT N/A 01/06/2021   Procedure: INSERTION PORT-A-CATH;  Surgeon: Emelia Loron, MD;  Location: [facility] OR;  Service: General;  Laterality: N/A;   RADIOACTIVE SEED GUIDED AXILLARY SENTINEL LYMPH NODE Left 05/23/2021   Procedure: RADIOACTIVE SEED GUIDED LEFT AXILLARY SENTINEL LYMPH NODE EXCISION;  Surgeon: Emelia Loron, MD;  Location: [facility] OR;  Service: General;  Laterality: Left;   TONSILLECTOMY     TOTAL MASTECTOMY Right 05/23/2021   Procedure: RIGHT TOTAL MASTECTOMY;  Surgeon: Emelia Loron, MD;  Location: [facility] OR;  Service: General;  Laterality: Right;   TUBAL LIGATION Bilateral 09/24/2016   Procedure: POST PARTUM TUBAL LIGATION;   Surgeon: Willodean Rosenthal, MD;  Location: [facility];  Service: Gynecology;  Laterality: Bilateral;    OB History     Gravida  8   Para  5   Term  4   Preterm  1   AB  3   Living  6      SAB  0   IAB  3   Ectopic  0   Multiple  1   Live Births  6            Home Medications    Prior to Admission medications   Medication Sig Start Date End Date Taking? Authorizing Provider  doxycycline (VIBRAMYCIN) 100 MG capsule Take 1 capsule (100 mg total) by mouth 2 (two) times daily. 04/28/23  Yes Laurelin Elson K, PA-C  lisinopril (ZESTRIL) 10 MG tablet Take 1 tablet by mouth once daily 01/10/23   Champ Mungo, DO  medroxyPROGESTERone (DEPO-PROVERA) 150 MG/ML injection Inject 1 mL (150 mg total) into the muscle every 3 (three) months. 04/06/23   Serena Croissant, MD  senna-docusate (SENOKOT S) 8.6-50 MG tablet Take 1 tablet by mouth at bedtime as needed for mild constipation. 02/23/23   Philomena Doheny, MD  sertraline (ZOLOFT) 25 MG tablet Take 1 tablet (25 mg total) by mouth daily. 12/27/22 12/27/23  Modena Slater, DO  tamoxifen (NOLVADEX) 20 MG tablet Take 1 tablet (20 mg total) by mouth daily. 04/06/23   Serena Croissant, MD  tiZANidine (ZANAFLEX) 4 MG tablet Take 1 tablet (4 mg total) by mouth 3 (three) times daily as needed for muscle spasms. 04/23/23 04/22/24  Champ Mungo, DO  zolpidem (AMBIEN) 5 MG tablet TAKE 1 TABLET BY MOUTH AT BEDTIME AS NEEDED FOR SLEEP 03/26/23   Champ Mungo, DO    Family History Family History  Problem Relation Age of Onset   Kidney disease Mother    Breast cancer Maternal Aunt        dx 28s   Breast cancer Other        MGM's sister and MGM's niece; dx after 72   Other Neg Hx     Social History Social History   Tobacco Use   Smoking status: Every Day    Current packs/day: 0.30    Types: Cigarettes   Smokeless tobacco: Never   Tobacco comments:    Wants to restart Nicotine  Vaping Use   Vaping status: Never Used   Substance Use Topics   Alcohol use: Not Currently    Alcohol/week: 1.0 standard drink of alcohol    Types: 1 Glasses of wine per week   Drug use: Not Currently     Allergies   Hydrocodone-acetaminophen, Tramadol, and Lactose intolerance (gi)   Review of Systems Review of Systems  Constitutional:  Negative for activity change, appetite change, fatigue and fever.  Gastrointestinal:  Negative for abdominal pain, diarrhea, nausea and vomiting.  Genitourinary:  Negative for dysuria, frequency, pelvic pain, urgency, vaginal bleeding, vaginal discharge and vaginal pain.  Physical Exam Triage Vital Signs ED Triage Vitals  Encounter Vitals Group     BP 04/28/23 1142 130/87     Systolic BP Percentile --      Diastolic BP Percentile --      Pulse Rate 04/28/23 1142 69     Resp 04/28/23 1142 15     Temp 04/28/23 1142 99.1 F (37.3 C)     Temp Source 04/28/23 1142 Oral     SpO2 04/28/23 1142 100 %     Weight 04/28/23 1138 150 lb (68 kg)     Height 04/28/23 1138 5\' 1"  (1.549 m)     Head Circumference --      Peak Flow --      Pain Score 04/28/23 1138 0     Pain Loc --      Pain Education --      Exclude from Growth Chart --    No data found.  Updated Vital Signs BP 130/87 (BP Location: Left Arm)   Pulse 69   Temp 99.1 F (37.3 C) (Oral)   Resp 15   Ht 5\' 1"  (1.549 m)   Wt 150 lb (68 kg)   LMP 04/14/2023   SpO2 100%   Breastfeeding No   BMI 28.34 kg/m   Visual Acuity Right Eye Distance:   Left Eye Distance:   Bilateral Distance:    Right Eye Near:   Left Eye Near:    Bilateral Near:     Physical Exam Vitals reviewed.  Constitutional:      General: She is awake. She is not in acute distress.    Appearance: Normal appearance. She is well-developed. She is not ill-appearing.     Comments: Very pleasant female appears stated age in no acute distress sitting comfortably in exam room  HENT:     Head: Normocephalic and atraumatic.  Cardiovascular:     Rate  and Rhythm: Normal rate and regular rhythm.     Heart sounds: Normal heart sounds, S1 normal and S2 normal. No murmur heard. Pulmonary:     Effort: Pulmonary effort is normal.     Breath sounds: Normal breath sounds. No wheezing, rhonchi or rales.     Comments: Clear to auscultation bilaterally Abdominal:     General: Bowel sounds are normal.     Palpations: Abdomen is soft.     Tenderness: There is no abdominal tenderness. There is no right CVA tenderness, left CVA tenderness, guarding or rebound.     Comments: Benign abdominal exam  Psychiatric:        Behavior: Behavior is cooperative.      UC Treatments / Results  Labs (all labs ordered are listed, but only abnormal results are displayed) Labs Reviewed  HIV ANTIBODY (ROUTINE TESTING W REFLEX)  RPR  CERVICOVAGINAL ANCILLARY ONLY    EKG   Radiology No results found.  Procedures Procedures (including critical care time)  Medications Ordered in UC Medications - No data to display  Initial Impression / Assessment and Plan / UC Course  I have reviewed the triage vital signs and the nursing notes.  Pertinent labs & imaging results that were available during my care of the patient were reviewed by me and considered in my medical decision making (see chart for details).     Patient is well-appearing, afebrile, nontoxic, nontachycardic.  Given known exposure to chlamydia will empirically treat with doxycycline 100 mg twice daily for 7 days.  We discussed that she is to abstain from sex  for 1 week after completing course of treatment (2 weeks total).  Discussed the importance of safe sex practices.  STI testing was obtained and we will contact her if we need to arrange additional treatment.  If she develops any symptoms including vaginal discharge, pelvic pain, fever, nausea, vomiting she is to return for reevaluation.  Final Clinical Impressions(s) / UC Diagnoses   Final diagnoses:  Screening examination for STI  Possible  exposure to STI  Exposure to chlamydia     Discharge Instructions      We are treating you for chlamydia.  Start doxycycline 100 mg twice daily for 7 days.  Stay out of the sun while on this medication as it can cause you to have a sunburn.  Abstain from sex for 1 week after finishing the medication (2 weeks total).  All partners need to be tested and treated as well.  Use a condom with each sexual encounter.  We will contact you if any of your other testing is positive and we need to arrange additional treatment.  If you develop any symptoms like pelvic pain, abdominal pain, vaginal discharge please return for reevaluation.     ED Prescriptions     Medication Sig Dispense Auth. Provider   doxycycline (VIBRAMYCIN) 100 MG capsule Take 1 capsule (100 mg total) by mouth 2 (two) times daily. 14 capsule Orissa Arreaga K, PA-C      PDMP not reviewed this encounter.   Jeani Hawking, PA-C 04/28/23 1206

## 2023-04-28 NOTE — ED Triage Notes (Signed)
Patient states her partner tested positive for Chlamydia. Pt states no symptoms.

## 2023-04-28 NOTE — Discharge Instructions (Signed)
We are treating you for chlamydia.  Start doxycycline 100 mg twice daily for 7 days.  Stay out of the sun while on this medication as it can cause you to have a sunburn.  Abstain from sex for 1 week after finishing the medication (2 weeks total).  All partners need to be tested and treated as well.  Use a condom with each sexual encounter.  We will contact you if any of your other testing is positive and we need to arrange additional treatment.  If you develop any symptoms like pelvic pain, abdominal pain, vaginal discharge please return for reevaluation.

## 2023-04-29 LAB — HIV ANTIBODY (ROUTINE TESTING W REFLEX): HIV Screen 4th Generation wRfx: NONREACTIVE

## 2023-04-29 LAB — RPR: RPR Ser Ql: NONREACTIVE

## 2023-04-30 ENCOUNTER — Other Ambulatory Visit: Payer: Self-pay | Admitting: Student

## 2023-04-30 LAB — CERVICOVAGINAL ANCILLARY ONLY
Chlamydia: NEGATIVE
Comment: NEGATIVE
Comment: NEGATIVE
Comment: NORMAL
Neisseria Gonorrhea: NEGATIVE
Trichomonas: NEGATIVE

## 2023-04-30 MED ORDER — ZOLPIDEM TARTRATE 5 MG PO TABS: 5.0000 mg | ORAL_TABLET | Freq: Every evening | ORAL | 0 refills | Status: DC | PRN

## 2023-05-02 DIAGNOSIS — Z419 Encounter for procedure for purposes other than remedying health state, unspecified: Secondary | ICD-10-CM | POA: Diagnosis not present

## 2023-05-08 ENCOUNTER — Encounter: Payer: Commercial Managed Care - HMO | Admitting: Internal Medicine

## 2023-05-08 NOTE — Progress Notes (Deleted)
 HTN BP ***. OP regimen is metoprolol  25 mg daily, lisinopril  10 mg daily which she is*** compliant with. BMP last checked ***. Plan:  GAD MDD BIPOLAR 1  BREAST CANCER  Insomnia OP regimen is ambien  5 mg daily at bedtime as needed for sleep which she takes *** nights weekly. Plan:  Back spasms Tizanidine  4 mg TID PRN muscle spasms started 10/25, previously was on robaxin . MRI and PT orders were placed at that visit however I do not see that the MRI has been done and she has not*** started PT. Today she describes her symptoms ***. Plan:

## 2023-05-27 ENCOUNTER — Other Ambulatory Visit: Payer: Self-pay | Admitting: Internal Medicine

## 2023-06-02 DIAGNOSIS — Z419 Encounter for procedure for purposes other than remedying health state, unspecified: Secondary | ICD-10-CM | POA: Diagnosis not present

## 2023-06-19 ENCOUNTER — Telehealth (INDEPENDENT_AMBULATORY_CARE_PROVIDER_SITE_OTHER): Payer: Commercial Managed Care - HMO | Admitting: Student

## 2023-06-19 ENCOUNTER — Encounter: Payer: Self-pay | Admitting: Student

## 2023-06-19 ENCOUNTER — Telehealth: Payer: Self-pay | Admitting: *Deleted

## 2023-06-19 DIAGNOSIS — F32A Depression, unspecified: Secondary | ICD-10-CM | POA: Diagnosis not present

## 2023-06-19 DIAGNOSIS — F419 Anxiety disorder, unspecified: Secondary | ICD-10-CM | POA: Diagnosis not present
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
## 2023-06-19 NOTE — Telephone Encounter (Addendum)
RTC to patient states that she will be unable to do that . She stated  that she was on her way somewhere and then placed me on hold. Held for a few minutes.  RTC to patient no answer.  Message left to seek care at the [facility].

## 2023-06-19 NOTE — Telephone Encounter (Signed)
Please call patient states her son passed on 07/02/2023 and she needs something to help calm her down.

## 2023-06-19 NOTE — Telephone Encounter (Signed)
Addendum: Pt denies any active SI/HI

## 2023-06-19 NOTE — Telephone Encounter (Signed)
Call from patient states son was killed on 06/15/2023., Very stressful.  Asking if something can be called in for her nerves.  Patient states was on Zoloft before.  Has not had in a while.  Would like to get something as soon as possible for her nerves.  In [place].  Unable to get to the [facility] on [address] at this time.  Was given an appointment for tomorrow as well.  Would like to get a call from the doctor as well.

## 2023-06-20 ENCOUNTER — Ambulatory Visit (INDEPENDENT_AMBULATORY_CARE_PROVIDER_SITE_OTHER): Payer: Commercial Managed Care - HMO | Admitting: Student

## 2023-06-20 ENCOUNTER — Other Ambulatory Visit (HOSPITAL_COMMUNITY): Payer: Self-pay

## 2023-06-20 ENCOUNTER — Encounter: Payer: Self-pay | Admitting: Internal Medicine

## 2023-06-20 ENCOUNTER — Encounter: Payer: Self-pay | Admitting: Hematology and Oncology

## 2023-06-20 ENCOUNTER — Telehealth: Payer: Self-pay

## 2023-06-20 DIAGNOSIS — F430 Acute stress reaction: Secondary | ICD-10-CM | POA: Diagnosis not present

## 2023-06-20 DIAGNOSIS — F410 Panic disorder [episodic paroxysmal anxiety] without agoraphobia: Secondary | ICD-10-CM | POA: Diagnosis not present

## 2023-06-20 MED ORDER — ALPRAZOLAM 0.5 MG PO TABS: 0.5000 mg | ORAL_TABLET | Freq: Three times a day (TID) | ORAL | 0 refills | Status: DC | PRN

## 2023-06-20 NOTE — Telephone Encounter (Signed)
Patient called regarding xanax, per patient the prescribers DEA number is not active. Please address.

## 2023-06-20 NOTE — Telephone Encounter (Signed)
Sent in Rx under my DEA
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
## 2023-06-20 NOTE — Addendum Note (Signed)
Addended by: Gust Rung on: 06/20/2023 07:15 PM   Modules accepted: Orders

## 2023-06-21 ENCOUNTER — Other Ambulatory Visit: Payer: Self-pay

## 2023-06-26 ENCOUNTER — Other Ambulatory Visit: Payer: Self-pay

## 2023-06-28 NOTE — Progress Notes (Signed)
 Internal Medicine Clinic Attending  Case discussed with the resident at the time of the visit.  We reviewed the resident's history and exam and pertinent patient test results.  I agree with the assessment, diagnosis, and plan of care documented in the resident's note.

## 2023-06-29 ENCOUNTER — Other Ambulatory Visit: Payer: Self-pay | Admitting: Internal Medicine

## 2023-06-29 ENCOUNTER — Other Ambulatory Visit: Payer: Self-pay

## 2023-06-30 DIAGNOSIS — Z419 Encounter for procedure for purposes other than remedying health state, unspecified: Secondary | ICD-10-CM | POA: Diagnosis not present

## 2023-07-02 ENCOUNTER — Encounter: Payer: Self-pay | Admitting: Pharmacist

## 2023-07-02 ENCOUNTER — Other Ambulatory Visit: Payer: Self-pay

## 2023-07-02 ENCOUNTER — Other Ambulatory Visit: Payer: Self-pay | Admitting: Internal Medicine

## 2023-07-03 NOTE — Telephone Encounter (Signed)
 Called pt - no answer. Left message on her vm to call the office about one of her medications.

## 2023-07-05 ENCOUNTER — Other Ambulatory Visit: Payer: Self-pay

## 2023-07-05 ENCOUNTER — Ambulatory Visit: Admitting: Student

## 2023-07-05 DIAGNOSIS — F4321 Adjustment disorder with depressed mood: Secondary | ICD-10-CM

## 2023-07-05 DIAGNOSIS — F32A Depression, unspecified: Secondary | ICD-10-CM | POA: Diagnosis not present

## 2023-07-05 DIAGNOSIS — F419 Anxiety disorder, unspecified: Secondary | ICD-10-CM

## 2023-07-05 DIAGNOSIS — Z634 Disappearance and death of family member: Secondary | ICD-10-CM

## 2023-07-05 MED ORDER — SERTRALINE HCL 50 MG PO TABS: 50.0000 mg | ORAL_TABLET | Freq: Every day | ORAL | 2 refills | Status: DC

## 2023-07-05 MED ORDER — ALPRAZOLAM 0.5 MG PO TABS: 0.5000 mg | ORAL_TABLET | Freq: Three times a day (TID) | ORAL | 0 refills | Status: DC | PRN

## 2023-07-05 NOTE — Progress Notes (Signed)
   CC: anxiety, panic attacks, grief from loss of son  This is a telephone encounter between [name] and [name] on 07/05/2023 for medication. The visit was conducted with the patient located at home and [name] at [facility]. The patient's identity was confirmed using their DOB and current address. The patient has consented to being evaluated through a telephone encounter and understands the associated risks (an examination cannot be done and the patient may need to come in for an appointment) / benefits (allows the patient to remain at home, decreasing exposure to coronavirus). I personally spent 20 minutes on medical discussion.   HPI:  [name] is a 38 y.o. with PMH as below.   Please see A&P for assessment of the patient's acute and chronic medical conditions.   Past Medical History:  Diagnosis Date   Abnormal Pap smear of cervix    Anemia    Anxiety    Arthritis    Bipolar disorder (HCC)    diagnosed years ago   Cancer (HCC)    Chlamydia    Depression    Drug use affecting pregnancy, antepartum 05/25/2016   History of cocaine use   Encounter for dental examination 04/12/2021   Family history of breast cancer 01/18/2021   GDM (gestational diabetes mellitus)    GERD (gastroesophageal reflux disease)    pt used to take omeprazole, doesn't have much issues with it anymore   Gonorrhea    Herpes genitalia    History of hiatal hernia    History of postpartum hemorrhage, currently pregnant 05/25/2016   History of radiation therapy    Left Chest wall- 09/27/21-11/25/21- Dr. Antony Blackbird   Migraines    Ovarian cyst    Polycystic kidney disease    Positive GBS test 09/23/2016   Preeclampsia    Pregnancy affected by fetal growth restriction 09/20/2016   Baby B. Getting weekly dopplers, AFI.    Pregnancy induced hypertension    Review of Systems: Negative except as noted in the problem based assessment and plan.  Assessment & Plan:   Anxiety and  depression She has a history of anxiety and panic attacks which is exacerbated recently with the murder of her son.  She was previously seen in two telehealth visits for these symptoms and was given short courses of Xanax, which help for a short period of time. She continues to have panic attacks several times a day. She feels that the Xanax helps her in the moment, but feels like she needs more long-term medication to treat her symptoms.  She was previously on Zoloft 25 mg daily, which was discontinued at some point.  She is not sure why it was discontinued but says that she felt like it helped her.  She has been struggling to get through all the funeral arrangements given her ongoing anxiety and grief and feels like the Xanax benefits her in the short term to help calm her heightened anxiety.  No SI/HI currently.    I spoke with her about grief counseling, which I feel like could benefit her.  She was agreeable.  I have placed a referral to [place], our behavioral specialist.  I am restarting Zoloft at 50 mg daily for more long-term treatment of her anxiety and depression. I will also prescribe another short course of Xanax to help her get through this period of heightened anxiety.    Patient discussed with Dr. Jerelene Redden, MD Internal Medicine Resident

## 2023-07-05 NOTE — Assessment & Plan Note (Addendum)
 She has a history of anxiety and panic attacks which is exacerbated recently with the murder of her son.  She was previously seen in two telehealth visits for these symptoms and was given short courses of Xanax, which help for a short period of time. She continues to have panic attacks several times a day. She feels that the Xanax helps her in the moment, but feels like she needs more long-term medication to treat her symptoms.  She was previously on Zoloft 25 mg daily, which was discontinued at some point.  She is not sure why it was discontinued but says that she felt like it helped her.  She has been struggling to get through all the funeral arrangements given her ongoing anxiety and grief and feels like the Xanax benefits her in the short term to help calm her heightened anxiety.  No SI/HI currently.    I spoke with her about grief counseling, which I feel like could benefit her.  She was agreeable.  I have placed a referral to [place], our behavioral specialist.  I am restarting Zoloft at 50 mg daily for more long-term treatment of her anxiety and depression. I will also prescribe another short course of Xanax to help her get through this period of heightened anxiety.

## 2023-07-05 NOTE — Telephone Encounter (Signed)
 Can u schedule pt an appt to discuss medication?  "Are there available time slots for telehealth with any of the current clinic residents? Per Dr August Saucer"

## 2023-07-05 NOTE — Telephone Encounter (Signed)
 Called pt - informed pt the doctor is requesting telehealth appt to discuss Xanax refill request. Pt stated her son's Robin Horn is tomorrow. Pt is agreeable to telehealth apppt this afternoon w/Dr Versie Starks.

## 2023-07-06 NOTE — Telephone Encounter (Signed)
 Pt had telehealth appt yesterday. Xanax was refilled.

## 2023-07-13 ENCOUNTER — Other Ambulatory Visit: Payer: Self-pay | Admitting: Internal Medicine

## 2023-07-13 ENCOUNTER — Ambulatory Visit (HOSPITAL_COMMUNITY): Admission: EM | Admit: 2023-07-13 | Discharge: 2023-07-13 | Disposition: A | Discharge status: Home / Self Care

## 2023-07-13 ENCOUNTER — Telehealth (HOSPITAL_COMMUNITY): Payer: Self-pay | Admitting: Licensed Clinical Social Worker

## 2023-07-13 DIAGNOSIS — F4322 Adjustment disorder with anxiety: Secondary | ICD-10-CM

## 2023-07-13 DIAGNOSIS — F4321 Adjustment disorder with depressed mood: Secondary | ICD-10-CM | POA: Diagnosis not present

## 2023-07-13 NOTE — Telephone Encounter (Signed)
 Last Fill: 07/05/23 15 tabs/0 RF  Last OV: 07/05/23 Next OV: 07/24/23  Routing to provider for review/authorization.
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
## 2023-07-13 NOTE — ED Notes (Signed)
 Patient Is discharging at this time. Printed AVS reviewed with patient by provider.
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
## 2023-07-13 NOTE — Telephone Encounter (Signed)
 Copied from CRM [id number]. Topic: Clinical - Medication Refill >> Jul 13, 2023  1:23 PM Hamdi H wrote: Most Recent Primary Care Visit:  Provider: [name]  Department: [facility]  Visit Type: TELEPHONE OFFICE VISIT  Date: 07/05/2023  Medication: ALPRAZolam (XANAX) 0.5 MG tablet  Has the patient contacted their pharmacy? No (Agent: If no, request that the patient contact the pharmacy for the refill. If patient does not wish to contact the pharmacy document the reason why and proceed with request.) (Agent: If yes, when and what did the pharmacy advise?) Pt stated behavioral health told her they don't provide that specific medication and she would need to get refills from her provider that prescribed it. Pt is also requesting for her dosage to be increased by 1 mg. Pt would like to be called about this please.   Is this the correct pharmacy for this prescription? Yes If no, delete pharmacy and type the correct one.  This is the patient's preferred pharmacy:  [facility] [address] ([address] [place] [address] [place]) [place] [address] Phone: [phone] Fax: [phone]   Has the prescription been filled recently? Yes  Is the patient out of the medication? No  Has the patient been seen for an appointment in the last year OR does the patient have an upcoming appointment? Yes  Can we respond through MyChart? No, she wants to get a call about this.   Agent: Please be advised that Rx refills may take up to 3 business days. We ask that you follow-up with your pharmacy.

## 2023-07-13 NOTE — Discharge Instructions (Addendum)
 Partial Hospitalization Program at [facility] Outpatient at [place]:This appointment will last approximately one hour and will be virtual via [employer] Please download the Teams app prior to the appointment. If you need to cancel or reschedule, please call [phone] and leave a voicemail with your name, date of birth, and phone number.  Based on the information you have provided and the presenting issue, outpatient services and resources for have been recommended. It is imperative that you follow through with treatment recommendations within 5-7 days from the of discharge to mitigate further risk to your safety and mental well-being. A list of referrals has been provided below to get you started. In case of an urgent crisis, you may contact the Mobile Crisis Unit with [facility] at [phone].   Partial Hospitalization Programs  Partial hospitalization is intended for people suffering  from mental illness as an alternative or step down from  inpatient hospitalization. Our program focuses on patients  who may be experiencing psychosis, depression or  other aggravated symptoms of their mental illness. PHP  attempts to provide interactive therapeutic treatment that  seeks to improve social functioning, recovery, and overall  health and wellness. Our program operates MondayThursday from 9 a.m.-2 p.m. and Friday from 8 a.m.-1 p.m.   FEATURES OF THE PROGRAM INCLUDE:   Individualized patient-centered planning to help you   achieve your personal goals   Group psychotherapy (Art therapy & Subtle yoga   techniques)  Psycho-educational classes   Staff Psychiatrist, Registered Nurse,   Recreational Therapist, Licensed Therapist  Occupational therapy focused on behavioral health   Family sessions   Linkage to community programs to help your recovery   Medications: Patient is to take medications as prescribed. The patient or patient's guardian is to contact a medical  professional and/or outpatient provider to address any new side effects that develop. The patient or the patient's guardian should update outpatient providers of any new medications and/or medication changes.   Outpatient Follow up: Please review list of outpatient resources for psychiatry and counseling. Please follow up with your primary care provider for all medical related needs.   You are encouraged to follow up with [facility] for outpatient treatment.  Walk in/ Open Access Hours: Monday - Friday 8AM - 11AM  [facility] [address] [place], [place] [phone]  Safety:   The following safety precautions should be taken:   No sharp objects. This includes scissors, razors, scrapers, and putty knives.   Chemicals should be removed and locked up.   Medications should be removed and locked up.   Weapons should be removed and locked up. This includes firearms, knives and instruments that can be used to cause injury.   The patient should abstain from use of illicit substances/drugs and abuse of any medications.  If symptoms worsen or do not continue to improve or if the patient becomes actively suicidal or homicidal then it is recommended that the patient return to the closest hospital emergency department, the [facility], or call 911 for further evaluation and treatment. National Suicide Prevention Lifeline 1-800-SUICIDE or 647-763-7214.  About 988 988 offers 24/7 access to trained crisis counselors who can help people experiencing mental health-related distress. People can call or text 988 or chat 988lifeline.org for themselves or if they are worried about a loved one who may need crisis support.

## 2023-07-14 NOTE — Progress Notes (Signed)
 Internal Medicine Clinic Attending  Case discussed with the resident at the time of the visit.  We reviewed the resident's history and exam and pertinent patient test results.  I agree with the assessment, diagnosis, and plan of care documented in the resident's note.

## 2023-07-18 ENCOUNTER — Encounter: Admitting: Student

## 2023-07-19 ENCOUNTER — Ambulatory Visit (HOSPITAL_COMMUNITY): Admitting: Professional

## 2023-07-19 ENCOUNTER — Telehealth (HOSPITAL_COMMUNITY): Payer: Self-pay | Admitting: Professional

## 2023-07-19 DIAGNOSIS — F4321 Adjustment disorder with depressed mood: Secondary | ICD-10-CM | POA: Insufficient documentation
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
## 2023-07-24 ENCOUNTER — Telehealth (HOSPITAL_COMMUNITY): Payer: Self-pay | Admitting: Professional

## 2023-07-24 ENCOUNTER — Ambulatory Visit (INDEPENDENT_AMBULATORY_CARE_PROVIDER_SITE_OTHER): Admitting: Professional

## 2023-07-24 ENCOUNTER — Ambulatory Visit (INDEPENDENT_AMBULATORY_CARE_PROVIDER_SITE_OTHER): Admitting: Licensed Clinical Social Worker

## 2023-07-24 DIAGNOSIS — F419 Anxiety disorder, unspecified: Secondary | ICD-10-CM

## 2023-07-24 DIAGNOSIS — F332 Major depressive disorder, recurrent severe without psychotic features: Secondary | ICD-10-CM | POA: Insufficient documentation

## 2023-07-24 DIAGNOSIS — F4310 Post-traumatic stress disorder, unspecified: Secondary | ICD-10-CM

## 2023-07-24 DIAGNOSIS — F4321 Adjustment disorder with depressed mood: Secondary | ICD-10-CM

## 2023-07-24 DIAGNOSIS — F333 Major depressive disorder, recurrent, severe with psychotic symptoms: Secondary | ICD-10-CM

## 2023-07-24 DIAGNOSIS — F32A Depression, unspecified: Secondary | ICD-10-CM

## 2023-07-24 NOTE — BH Specialist Note (Signed)
 Patient no-showed today's appointment; appointment was for Telephone visit at 10:30am  Behavioral Health Clinician [facility] from here on out)  attempted patient via Telephone number [phone]   [facility] contacted patient from telephone number [phone]. [facility] left a  VM.   [facility] will make a 2nd attempt within 7 days or patients can call back to reschedule sooner.   Christen Butter, MSW, LCSW-A She/Her Behavioral Health Clinician [facility]  Internal Medicine Center Direct Dial:[phone]  Fax [phone] Main Office Phone: [phone] [address] [place] [place], [place] [address] Website: [facility] Internal Medicine [facility]  [place], [place]  [place]

## 2023-07-26 ENCOUNTER — Ambulatory Visit (HOSPITAL_COMMUNITY)
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
## 2023-07-31 ENCOUNTER — Ambulatory Visit (INDEPENDENT_AMBULATORY_CARE_PROVIDER_SITE_OTHER): Admitting: Licensed Clinical Social Worker

## 2023-07-31 DIAGNOSIS — F32A Depression, unspecified: Secondary | ICD-10-CM

## 2023-07-31 DIAGNOSIS — F419 Anxiety disorder, unspecified: Secondary | ICD-10-CM

## 2023-07-31 NOTE — BH Specialist Note (Signed)
 The client has successfully transitioned to mental health services at [facility]) and is now under the care of Whitney J. Almond Lint, [facility], a Licensed Visual merchandiser. The client missed previous appointments with this therapist([facility]) due to their enrollment in services at [facility]. As the client has established care with another provider, no further follow-up is required by [facility]. All upcoming appointments with [facility] will be canceled as continuity of care is now ensured through [facility]. The client is encouraged to continue working with their current provider at [facility] for ongoing mental health support. No additional referrals or follow-up actions are necessary from [facility] at this time, and this note serves to conclude the client's care with [facility] while ensuring a smooth transition to their new provider.   Christen Butter, MSW, LCSW-A She/Her Behavioral Health Clinician [facility]  Internal Medicine Center Direct [phone]  Fax [phone] Main Office Phone: [phone] [address] [place] [place], [place] [address] Website: [facility] Internal Medicine [facility]  [place], [place]  [place]

## 2023-07-31 NOTE — Progress Notes (Signed)
 The client has successfully transitioned to mental health services at [facility]) and is now under the care of Whitney J. Almond Lint, [facility], a Licensed Visual merchandiser. The client missed previous appointments with this therapist([facility]) due to their enrollment in services at [facility]. As the client has established care with another provider, no further follow-up is required by [facility]. All upcoming appointments with [facility] will be canceled as continuity of care is now ensured through [facility]. The client is encouraged to continue working with their current provider at [facility] for ongoing mental health support. No additional referrals or follow-up actions are necessary from [facility] at this time, and this note serves to conclude the client's care with [facility] while ensuring a smooth transition to their new provider.   Christen Butter, MSW, LCSW-A She/Her Behavioral Health Clinician [facility] Direct [phone]  Fax [phone] Main Office Phone: [phone] [address] [place] [place], [place] [address] Website: [facility] Internal Medicine [facility]  [place], [place]  [place]

## 2023-08-01 ENCOUNTER — Other Ambulatory Visit: Payer: Self-pay | Admitting: Student

## 2023-08-01 NOTE — Telephone Encounter (Signed)
 Copied from CRM [id number]. Topic: Clinical - Medication Refill >> Aug 01, 2023  1:32 PM Irine Seal wrote: Most Recent Primary Care Visit:  Provider: Rich Reining  Department: [facility]  Visit Type: INTEGRATED BH CONSULT 60  Date: 07/24/2023  Medication: tiZANidine (ZANAFLEX) 4 MG tablet zolpidem (AMBIEN) 5 MG tablet  ALPRAZolam (XANAX) 0.5 MG table   Has the patient contacted their pharmacy? No (Agent: If no, request that the patient contact the pharmacy for the refill. If patient does not wish to contact the pharmacy document the reason why and proceed with request.) (Agent: If yes, when and what did the pharmacy advise?)  Is this the correct pharmacy for this prescription? Yes If no, delete pharmacy and type the correct one.  This is the patient's preferred pharmacy:  [facility] [address] ([address] [place] - [address] [place] ([place]) [place] [address] Phone: [phone] Fax: [phone]   Has the prescription been filled recently? no  Is the patient out of the medication? Yes  Has the patient been seen for an appointment in the last year OR does the patient have an upcoming appointment? Yes  Can we respond through MyChart? Yes  Agent: Please be advised that Rx refills may take up to 3 business days. We ask that you follow-up with your pharmacy.

## 2023-08-03 ENCOUNTER — Encounter: Payer: Self-pay | Admitting: Hematology and Oncology

## 2023-08-08 ENCOUNTER — Telehealth: Payer: Self-pay | Admitting: *Deleted

## 2023-08-08 NOTE — Telephone Encounter (Signed)
 Pt has an appt tomorrow 4/10.

## 2023-08-08 NOTE — Telephone Encounter (Signed)
 I called pt who she has been going thru a lot with the death of her son. "A lot of anxiety; panic attacks". Denies harming her self . We have no available appts this week at this time. I suggested going to [facility] - she stated she has been there and was sent back here to our office.Stated she does not need to talk to [facility]. I told her I will inform her doctor and if an open appt becomes open., we will give her a call.

## 2023-08-08 NOTE — Telephone Encounter (Signed)
 No openings.  Please refer to message below.    Thanks,    [place]  ----- Message -----  From: Hurley Cisco  Sent: 08/07/2023   5:16 AM EDT  To: Omer Jack  Subject: Appointment Request                              Appointment Request From: Hurley Cisco    With Provider: Modena Slater [facility] Dept Of Eligha Brideg[facility]. [facility]]    Preferred Date Range: 08/08/2023 - 08/08/2023    Preferred Times: Any Time    Reason for visit: Office Visit    Health Maintenance Topic:    Comments:  Need medicine I've been going through a lot my son death really took me through a lot

## 2023-08-09 ENCOUNTER — Ambulatory Visit (INDEPENDENT_AMBULATORY_CARE_PROVIDER_SITE_OTHER): Admitting: Student

## 2023-08-09 ENCOUNTER — Encounter: Payer: Self-pay | Admitting: Student

## 2023-08-09 VITALS — BP 128/79 | HR 107 | Temp 99.1°F | Ht 61.0 in | Wt 164.0 lb

## 2023-08-09 DIAGNOSIS — F319 Bipolar disorder, unspecified: Secondary | ICD-10-CM | POA: Diagnosis not present

## 2023-08-09 DIAGNOSIS — F419 Anxiety disorder, unspecified: Secondary | ICD-10-CM | POA: Diagnosis not present

## 2023-08-09 DIAGNOSIS — F4321 Adjustment disorder with depressed mood: Secondary | ICD-10-CM

## 2023-08-09 DIAGNOSIS — K029 Dental caries, unspecified: Secondary | ICD-10-CM | POA: Diagnosis not present

## 2023-08-09 DIAGNOSIS — K0530 Chronic periodontitis, unspecified: Secondary | ICD-10-CM | POA: Diagnosis not present

## 2023-08-09 DIAGNOSIS — F4310 Post-traumatic stress disorder, unspecified: Secondary | ICD-10-CM

## 2023-08-09 MED ORDER — ALPRAZOLAM 0.5 MG PO TABS: 0.5000 mg | ORAL_TABLET | Freq: Three times a day (TID) | ORAL | 0 refills | Status: DC | PRN

## 2023-08-09 MED ORDER — HYDROXYZINE HCL 10 MG PO TABS: 10.0000 mg | ORAL_TABLET | Freq: Three times a day (TID) | ORAL | 0 refills | Status: DC | PRN

## 2023-08-09 MED ORDER — AMOXICILLIN-POT CLAVULANATE 875-125 MG PO TABS: 1.0000 | ORAL_TABLET | Freq: Two times a day (BID) | ORAL | 0 refills | Status: AC

## 2023-08-09 MED ORDER — HYDROXYZINE HCL 10 MG PO TABS: 10.0000 mg | ORAL_TABLET | Freq: Three times a day (TID) | ORAL | 2 refills | Status: AC | PRN

## 2023-08-09 NOTE — Assessment & Plan Note (Signed)
 Advance teeth disease with apical periodontitis. Patient is currently working on finding dentist. No mandibular or sinus tenderness, fevers, or chills. No overt drainage seen on inspection - 5 days of amox-clauv BID sent to pharmacy

## 2023-08-09 NOTE — Assessment & Plan Note (Signed)
 Off of medications. Previously seen at [facility], unable to see records - Referral to psychiatry for further management
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
## 2023-08-09 NOTE — Patient Instructions (Signed)
 Thank you, [name] for allowing [place] to provide your care today. Today we discussed your grief, your stress and anxiety, the need for establishing care with psychiatry services, and your cavities/dental infection.    Referrals: - We will try to work with our referral manager to see where your insurance is covered. For now, please look at the services below. They have walk ins for management of anxiety. Long term we need to work on both your bipolar disorder and on your coping mechanisms  New medications: - I prescribed you another refill of Xanax. Use it sparingly; try to not use it more than once a day and see how that goes.  - Added Atarax - another medication that can help you with anxiety. Take one pill up to 3 times per day as needed, especially when you feel the symptoms coming  For your cavities and dental pain - I prescribed an antibiotic: Augmentin. Take one pill with food every 12 hours for 5 days - Please follow up with your dentist. If you don't have one, call your insurance cards and they can instruct you     Please follow-up in: 4 weeks    We look forward to seeing you next time. Please call our clinic at [phone] if you have any questions or concerns. The best time to call is Monday-Friday from 9am-4pm, but there is someone available 24/7. If after hours or the weekend, call the main hospital number and ask for the Internal Medicine Resident On-Call. If you need medication refills, please notify your pharmacy one week in advance and they will send [place] a request.   Thank you for letting us take part in your care. Wishing you the best!  Morene Crocker, MD 08/09/2023, 1:58 PM [facility] Medicine Residency [facility]   [facility] - preferred! URGENT CARE SERVICES - WALK IN Phone: [phone]  Address: [address] [place], [place] [address]  Hours:Open 24/7, No appointment required.  URGENT CARE SERVICES Assessment:   mental health evaluation, assessing immediate safety concerns and further mental health needs.  Referral: Resources, Connect to community-based mental health treatment, when indicated, including psychotherapy, psychiatry and other specialized behavioral health or substance use disorder services (for those not already in treatment).  Transitional care: In-person assessment and/or virtual follow up during the patient's transition to connect them with the appropriate outpatient services.  OUTPATIENT SERVICES  Individual Therapy Partial Hospitalization Program (PHP) Substance Abuse Intensive Outpatient Program [facility]) Specialized Intensive Adult Group Therapy Medication Management Peer Living Room

## 2023-08-09 NOTE — Assessment & Plan Note (Signed)
>>  ASSESSMENT AND PLAN FOR CARIES WRITTEN ON 08/09/2023  5:50 PM BY GOMEZ-CARABALLO, MARIA, MD  Advance teeth disease with apical periodontitis. Patient is currently working on finding dentist. No mandibular or sinus tenderness, fevers, or chills. No overt drainage seen on inspection - 5 days of amox-clauv BID sent to pharmacy
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
## 2023-08-10 MED ORDER — ZOLPIDEM TARTRATE 5 MG PO TABS: 5.0000 mg | ORAL_TABLET | Freq: Every evening | ORAL | 0 refills | Status: DC | PRN

## 2023-08-10 MED ORDER — TIZANIDINE HCL 4 MG PO TABS: 4.0000 mg | ORAL_TABLET | Freq: Three times a day (TID) | ORAL | 0 refills | Status: DC

## 2023-08-11 DIAGNOSIS — Z419 Encounter for procedure for purposes other than remedying health state, unspecified: Secondary | ICD-10-CM | POA: Diagnosis not present

## 2023-08-12 NOTE — Progress Notes (Signed)
 Internal Medicine Clinic Attending  Case discussed with the resident at the time of the visit.  We reviewed the resident's history and exam and pertinent patient test results.  I agree with the assessment, diagnosis, and plan of care documented in the resident's note.

## 2023-08-13 ENCOUNTER — Telehealth: Payer: Self-pay | Admitting: *Deleted

## 2023-08-13 ENCOUNTER — Telehealth: Payer: Self-pay

## 2023-08-13 MED ORDER — ZOLPIDEM TARTRATE 5 MG PO TABS: 5.0000 mg | ORAL_TABLET | Freq: Every evening | ORAL | 0 refills | Status: DC | PRN

## 2023-08-13 NOTE — Telephone Encounter (Signed)
 I called [facility] pharmacy who stated it's showing Dr Eliberto Grosser DEA# has expired; they're unsure why this is happening since they have used it before. I will ask The Attending to re-send rx.

## 2023-08-13 NOTE — Telephone Encounter (Signed)
 Prior Authorization for patient (Zolpidem Tartrate 5MG  tablets) came through on cover my meds was submitted with last office notes awaiting approval or denial.  [id number]

## 2023-08-13 NOTE — Telephone Encounter (Signed)
 Tere Felts (Key: [facility]) PA Case ID #: [id number] Rx #: [id number] Need Help? Call us  at [phone] Outcome Approved today by [facility] Medicaid 2017 Approved. This drug has been approved. Approved quantity: 30 tablets per 30 day(s). You may fill up to a 34 day supply at a retail pharmacy. You may fill up to a 90 day supply for maintenance drugs, please refer to the formulary for details. Please call the pharmacy to process your prescription claim. Effective Date: 08/13/2023 Authorization Expiration Date: 02/09/2024 Drug Zolpidem Tartrate 5MG  tablets ePA cloud logo Form [employer] [place]  Electronic Prior Authorization Request Form (2017 NCPDP) Original Claim Info 18 Submit 3 DS For Emerg Fill with PA Type01, PA Number [id number], Level of Service 3PLAN LIMIT EXCEEDED

## 2023-08-13 NOTE — Telephone Encounter (Signed)
 Copied from CRM [id number]. Topic: Clinical - Prescription Issue >> Aug 13, 2023  9:21 AM Arlie Benedict B wrote: Reason for CRM: Yolanda Hence from pharmacy, [phone] [facility] pharmacy states the listed prescription is not able to be filled by the provider who requested it. It showed in the system the provider is unable to prescribe this medication and would need a different provider to prescribe it.  zolpidem (AMBIEN) 5 MG tablet,

## 2023-08-17 ENCOUNTER — Other Ambulatory Visit: Payer: Self-pay | Admitting: Student

## 2023-08-17 ENCOUNTER — Other Ambulatory Visit: Payer: Self-pay | Admitting: Internal Medicine

## 2023-08-17 DIAGNOSIS — F419 Anxiety disorder, unspecified: Secondary | ICD-10-CM

## 2023-08-17 DIAGNOSIS — F4321 Adjustment disorder with depressed mood: Secondary | ICD-10-CM

## 2023-08-18 ENCOUNTER — Other Ambulatory Visit: Payer: Self-pay | Admitting: Internal Medicine

## 2023-08-22 ENCOUNTER — Other Ambulatory Visit: Payer: Self-pay | Admitting: Student

## 2023-08-22 ENCOUNTER — Ambulatory Visit: Payer: Self-pay

## 2023-08-22 DIAGNOSIS — F4321 Adjustment disorder with depressed mood: Secondary | ICD-10-CM

## 2023-08-22 DIAGNOSIS — F419 Anxiety disorder, unspecified: Secondary | ICD-10-CM

## 2023-08-22 NOTE — Telephone Encounter (Signed)
 Requesting refill of Xanax -pending see previous refill encounter 08/22/23, and tamoxifen -not on current med list. Please advise.    Copied from CRM [id number]. Topic: Clinical - Prescription Issue >> Aug 22, 2023  5:38 PM Karole Pacer C wrote: Reason for CRM: Patient states she contacted the pharmacy and was advised the following medication: ALPRAZolam  (XANAX ) 0.5 MG tablet was not available for pickup. Patient also states she has been without the following medication:tamoxifen  (NOLVADEX ) 20 MG tablet for some time and the prescribing provider can't see her until December 2025. She would like to know if something could be sent to the pharmacy until then. Reason for Disposition  Caller requesting a CONTROLLED substance prescription refill (e.g., narcotics, ADHD medicines)  Protocols used: Medication Refill and Renewal Call-A-AH

## 2023-08-23 MED ORDER — ALPRAZOLAM 0.5 MG PO TABS: 0.5000 mg | ORAL_TABLET | Freq: Three times a day (TID) | ORAL | 0 refills | Status: DC | PRN

## 2023-08-28 ENCOUNTER — Encounter: Payer: Self-pay | Admitting: Student

## 2023-08-31 ENCOUNTER — Telehealth: Payer: Self-pay | Admitting: *Deleted

## 2023-08-31 MED ORDER — ALPRAZOLAM 0.5 MG PO TABS: 0.5000 mg | ORAL_TABLET | Freq: Two times a day (BID) | ORAL | 0 refills | Status: DC | PRN

## 2023-08-31 NOTE — Telephone Encounter (Signed)
 Received call from pt wit complaint of right axilla nodule.  Pt denies any redness, swelling, drainage or recent injury/trauma.  Pt requesting breast exam for further evaluation. [facility] visit scheduled.   Pt also requesting phone number for Dr. Valeta Gaudier to have breast expanders removed, office number provided to pt.  Pt also requesting refill on Metoprolol .  Medication is currently not on file for pt.  Pt states she has not taken this medication in over 6 months and wanting to know if she needs to continue.  After chart investigation, Dr. Renna Cary with cardiology had put pt on Metoprolol  and wanted pt to f/u with repeat echo but pt was a no show to the appt.  RN provided pt with MD contact info and encouraged pt to reach out to office for a f/u visit.  Pt verbalized understanding.
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
## 2023-09-09 NOTE — Progress Notes (Deleted)
 [facility] Cancer Follow up:    Robin Scudder, DO [address] [place] [address]   DIAGNOSIS: Cancer Staging  Malignant neoplasm of upper-outer quadrant of left breast in female, estrogen receptor positive (HCC) Staging form: Breast, AJCC 8th Edition - Clinical stage from 12/23/2020: Stage IB (cT2, cN1, cM0, G3, ER+, PR+, HER2+) - Signed by Cameron Cea, MD on 12/23/2020 Stage prefix: Initial diagnosis Histologic grading system: 3 grade system    SUMMARY OF ONCOLOGIC HISTORY: Oncology History  Left breast lump (Resolved)  Malignant neoplasm of upper-outer quadrant of left breast in female, estrogen receptor positive (HCC)  12/17/2020 Initial Diagnosis   Palpable lump in the upper outer left breast. Diagnostic mammogram and US  on 12/15/20 showed multiple suspicious masses, 2 abnormal lymph nodes with increased cortical thickness, 1 lymph node with borderline abnormal cortical thickness, Biopsy on 12/17/20 showed invasive ductal carcinoma and DCIS, Her2+ (3+)/ER+ (80%)/PR- (<1%) in the left breast, and invasive ductal carcinoma, Her2+ (3+)/ER+ (80%)/PR+ (3%) in the left axillary lymph node   12/23/2020 Cancer Staging   Staging form: Breast, AJCC 8th Edition - Clinical stage from 12/23/2020: Stage IB (cT2, cN1, cM0, G3, ER+, PR+, HER2+) - Signed by Cameron Cea, MD on 12/23/2020 Stage prefix: Initial diagnosis Histologic grading system: 3 grade system   01/11/2021 - 04/29/2021 Chemotherapy   Patient is on Treatment Plan : BREAST  Docetaxel  + Carboplatin  + Trastuzumab  + Pertuzumab   (TCHP) q21d      01/31/2021 Genetic Testing   Negative hereditary cancer genetic testing: no pathogenic variants detected in [facility] Common Hereditary Cancers +RNA Panel.  The report date is January 31, 2021.    The Common Hereditary Cancers + RNA Panel offered by [facility] includes sequencing, deletion/duplication, and RNA testing of the following 47 genes: APC, ATM, AXIN2, BARD1, BMPR1A,  BRCA1, BRCA2, BRIP1, CDH1, CDK4*, CDKN2A (p14ARF)*, CDKN2A (p16INK4a)*, CHEK2, CTNNA1, DICER1, EPCAM (Deletion/duplication testing only), GREM1 (promoter region deletion/duplication testing only), KIT, MEN1, MLH1, MSH2, MSH3, MSH6, MUTYH, NBN, NF1, NHTL1, PALB2, PDGFRA*, PMS2, POLD1, POLE, PTEN, RAD50, RAD51C, RAD51D, SDHB, SDHC, SDHD, SMAD4, SMARCA4. STK11, TP53, TSC1, TSC2, and VHL.  The following genes were evaluated for sequence changes only: SDHA and HOXB13 c.251G>A variant only.  RNA analysis is not performed for the * genes.     05/23/2021 Surgery   Left mastectomy: Negative for residual cancer.  Extensive residual high-grade DCIS, 0/4 lymph nodes negative Right mastectomy: Benign Complete pathologic response   06/08/2021 - 12/13/2021 Chemotherapy   Patient is on Treatment Plan : BREAST Trastuzumab   + Pertuzumab  q21d x 13 cycles     06/16/2021 -  Chemotherapy   Patient is on Treatment Plan : BREAST Trastuzumab  + Pertuzumab  q21d     09/27/2021 - 11/25/2021 Radiation Therapy   Site Technique Total Dose (Gy) Dose per Fx (Gy) Completed Fx Beam Energies  Chest Wall, Left: CW_L 3D 50/50 2 25/25 6X, 10X  Sclav-LT: SCV_L 3D 50/50 2 25/25 6X, 10X  Chest Wall, Left: CW_L_Bst Electron 10/10 2 5/5 6E     11/2021 -  Anti-estrogen oral therapy   Tamoxifen      CURRENT THERAPY:  INTERVAL HISTORY:  Discussed the use of AI scribe software for clinical note transcription with the patient, who gave verbal consent to proceed.  Robin Horn 38 y.o. female returns for    Patient Active Problem List   Diagnosis Date Noted   PTSD (post-traumatic stress disorder) 07/24/2023   MDD (major depressive disorder), recurrent episode, severe (HCC) 07/24/2023  Grief 07/19/2023   Constipation 02/25/2023   Anxiety and depression 12/27/2022   Menorrhagia with irregular cycle 12/27/2022   Muscle spasm 10/12/2021   Housing instability 10/12/2021   S/P breast reconstruction 05/23/2021   Caries 04/12/2021    Teeth missing 04/12/2021   Accretions on teeth 04/12/2021   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   Port-A-Cath in place 01/18/2021   Malignant neoplasm of upper-outer quadrant of left breast in female, estrogen receptor positive (HCC) 12/23/2020   Hypertension 11/15/2020   Bipolar 1 disorder (HCC) 05/25/2016   Insomnia 05/25/2016    is allergic to hydrocodone -acetaminophen , tramadol , and lactose intolerance (gi).  MEDICAL HISTORY: Past Medical History:  Diagnosis Date   Abnormal Pap smear of cervix    Anemia    Anxiety    Arthritis    Bipolar disorder (HCC)    diagnosed years ago   Cancer (HCC)    Chlamydia    Depression    Drug use affecting pregnancy, antepartum 05/25/2016   History of cocaine use   Encounter for dental examination 04/12/2021   Family history of breast cancer 01/18/2021   GDM (gestational diabetes mellitus)    GERD (gastroesophageal reflux disease)    pt used to take omeprazole, doesn't have much issues with it anymore   Gonorrhea    Herpes genitalia    History of hiatal hernia    History of postpartum hemorrhage, currently pregnant 05/25/2016   History of radiation therapy    Left Chest wall- 09/27/21-11/25/21- Dr. Retta Caster   Migraines    Ovarian cyst    Polycystic kidney disease    Positive GBS test 09/23/2016   Preeclampsia    Pregnancy affected by fetal growth restriction 09/20/2016   Baby B. Getting weekly dopplers, AFI.    Pregnancy induced hypertension     SURGICAL HISTORY: Past Surgical History:  Procedure Laterality Date   BREAST RECONSTRUCTION WITH PLACEMENT OF TISSUE EXPANDER AND FLEX HD (ACELLULAR HYDRATED DERMIS) Bilateral 05/23/2021   Procedure: BREAST RECONSTRUCTION WITH PLACEMENT OF TISSUE EXPANDER AND FLEX HD (ACELLULAR HYDRATED DERMIS);  Surgeon: Barb Bonito, MD;  Location[facility] OR;  Service: Plastics;  Laterality: Bilateral;   COLPOSCOPY     DILATION AND CURETTAGE OF UTERUS      MASTECTOMY W/ SENTINEL NODE BIOPSY Left 05/23/2021   Procedure: LEFT MASTECTOMY WITH LEFT AXILLARY SENTINEL LYMPH NODE BIOPSY;  Surgeon: Enid Harry, MD;  Location: [facility] OR;  Service: General;  Laterality: Left;   PORT A CATH REVISION Right 01/31/2021   Procedure: PORT A CATH REVISION;  Surgeon: Enid Harry, MD;  Location: [facility];  Service: General;  Laterality: Right;   PORTACATH PLACEMENT N/A 01/06/2021   Procedure: INSERTION PORT-A-CATH;  Surgeon: Enid Harry, MD;  Location: [facility] OR;  Service: General;  Laterality: N/A;   RADIOACTIVE SEED GUIDED AXILLARY SENTINEL LYMPH NODE Left 05/23/2021   Procedure: RADIOACTIVE SEED GUIDED LEFT AXILLARY SENTINEL LYMPH NODE EXCISION;  Surgeon: Enid Harry, MD;  Location: [facility] OR;  Service: General;  Laterality: Left;   TONSILLECTOMY     TOTAL MASTECTOMY Right 05/23/2021   Procedure: RIGHT TOTAL MASTECTOMY;  Surgeon: Enid Harry, MD;  Location: [facility] OR;  Service: General;  Laterality: Right;   TUBAL LIGATION Bilateral 09/24/2016   Procedure: POST PARTUM TUBAL LIGATION;  Surgeon: Lenord Radon, MD;  Location: [facility]TH[facility];  Service: Gynecology;  Laterality: Bilateral;    SOCIAL HISTORY: Social History   Socioeconomic History   Marital status: Single  Spouse name: Not on file   Number of children: Not on file   Years of education: Not on file   Highest education level: Not on file  Occupational History   Not on file  Tobacco Use   Smoking status: Every Day    Current packs/day: 0.30    Types: Cigarettes   Smokeless tobacco: Never   Tobacco comments:    Wants to restart Nicotine   Vaping Use   Vaping status: Never Used  Substance and Sexual Activity   Alcohol  use: Not Currently    Alcohol /week: 1.0 standard drink of alcohol     Types: 1 Glasses of wine per week   Drug use: Not Currently   Sexual activity: Yes    Birth control/protection: None, Surgical    Comment: Tubal  Other Topics  Concern   Not on file  Social History Narrative   Not on file   Social Drivers of Health   Financial Resource Strain: High Risk (03/14/2022)   Overall Financial Resource Strain (CARDIA)    Difficulty of Paying Living Expenses: Very hard  Food Insecurity: Food Insecurity Present (03/14/2022)   Hunger Vital Sign    Worried About Running Out of Food in the Last Year: Often true    Ran Out of Food in the Last Year: Often true  Transportation Needs: Unmet Transportation Needs (03/14/2022)   PRAPARE - Administrator, Civil Service (Medical): Yes    Lack of Transportation (Non-Medical): Yes  Physical Activity: Not on file  Stress: Stress Concern Present (10/26/2021)   [employer] of Occupational Health - Occupational Stress Questionnaire    Feeling of Stress : To some extent  Social Connections: Socially Isolated (03/14/2022)   Social Connection and Isolation Panel [NHANES]    Frequency of Communication with Friends and Family: Twice a week    Frequency of Social Gatherings with Friends and Family: Once a week    Attends Religious Services: Never    Database administrator or Organizations: No    Attends Banker Meetings: Never    Marital Status: Never married  Intimate Partner Violence: Not At Risk (03/14/2022)   Humiliation, Afraid, Rape, and Kick questionnaire    Fear of Current or Ex-Partner: No    Emotionally Abused: No    Physically Abused: No    Sexually Abused: No    FAMILY HISTORY: Family History  Problem Relation Age of Onset   Kidney disease Mother    Breast cancer Maternal Aunt        dx 53s   Breast cancer Other        MGM's sister and MGM's niece; dx after 26   Other Neg Hx     Review of Systems - Oncology    PHYSICAL EXAMINATION    There were no vitals filed for this visit.  Physical Exam  LABORATORY DATA:  CBC    Component Value Date/Time   WBC 10.0 02/23/2023 1126   WBC 5.7 04/06/2022 0924   WBC 6.1 05/17/2021  1600   RBC 4.11 02/23/2023 1126   RBC 3.98 04/06/2022 0924   HGB 11.6 02/23/2023 1126   HCT 37.2 02/23/2023 1126   PLT 276 02/23/2023 1126   MCV 91 02/23/2023 1126   MCH 28.2 02/23/2023 1126   MCH 29.1 04/06/2022 0924   MCHC 31.2 (L) 02/23/2023 1126   MCHC 32.8 04/06/2022 0924   RDW 13.6 02/23/2023 1126   LYMPHSABS 1.4 04/06/2022 0924   MONOABS 0.4 04/06/2022 0924  EOSABS 0.9 (H) 04/06/2022 0924   BASOSABS 0.0 04/06/2022 0924    CMP     Component Value Date/Time   NA 142 02/23/2023 1126   K 4.1 02/23/2023 1126   CL 103 02/23/2023 1126   CO2 23 02/23/2023 1126   GLUCOSE 70 02/23/2023 1126   GLUCOSE 93 04/06/2022 0924   BUN 18 02/23/2023 1126   CREATININE 1.68 (H) 02/23/2023 1126   CREATININE 1.40 (H) 04/06/2022 0924   CALCIUM  9.1 02/23/2023 1126   PROT 6.8 04/06/2022 0924   ALBUMIN 3.8 04/06/2022 0924   AST 12 (L) 04/06/2022 0924   ALT 8 04/06/2022 0924   ALKPHOS 55 04/06/2022 0924   BILITOT 0.2 (L) 04/06/2022 0924   GFRNONAA 50 (L) 04/06/2022 0924   GFRAA >60 09/18/2018 2340     ASSESSMENT and THERAPY PLAN:   No problem-specific Assessment & Plan notes found for this encounter.     All questions were answered. The patient knows to call the clinic with any problems, questions or concerns. We can certainly see the patient much sooner if necessary.  Total encounter time:*** minutes*in face-to-face visit time, chart review, lab review, care coordination, order entry, and documentation of the encounter time.    Alwin Baars, NP 09/09/23 8:59 PM Medical Oncology and Hematology [facility] [address] [place], [place] [address] Tel. [phone]    Fax. [phone]  *Total Encounter Time as defined by the Centers for Medicare and Medicaid Services includes, in addition to the face-to-face time of a patient visit (documented in the note above) non-face-to-face time: obtaining and reviewing outside history, ordering and reviewing medications,  tests or procedures, care coordination (communications with other health care professionals or caregivers) and documentation in the medical record.

## 2023-09-10 ENCOUNTER — Inpatient Hospital Stay: Admitting: Adult Health

## 2023-09-10 DIAGNOSIS — Z419 Encounter for procedure for purposes other than remedying health state, unspecified: Secondary | ICD-10-CM | POA: Diagnosis not present

## 2023-09-12 ENCOUNTER — Inpatient Hospital Stay: Attending: Adult Health | Admitting: Adult Health

## 2023-09-17 NOTE — Progress Notes (Deleted)
 Cardiology Office Note    Patient Name: Robin Horn Date of Encounter: 09/17/2023  Primary Care Provider:  [name], DO Primary Cardiologist:  None Primary Electrophysiologist: None   Past Medical History    Past Medical History:  Diagnosis Date   Abnormal Pap smear of cervix    Anemia    Anxiety    Arthritis    Bipolar disorder (HCC)    diagnosed years ago   Cancer [facility])    Chlamydia    Depression    Drug use affecting pregnancy, antepartum 05/25/2016   History of cocaine use   Encounter for dental examination 04/12/2021   Family history of breast cancer 01/18/2021   GDM (gestational diabetes mellitus)    GERD (gastroesophageal reflux disease)    pt used to take omeprazole, doesn't have much issues with it anymore   Gonorrhea    Herpes genitalia    History of hiatal hernia    History of postpartum hemorrhage, currently pregnant 05/25/2016   History of radiation therapy    Left Chest wall- 09/27/21-11/25/21- Dr. Retta Caster   Migraines    Ovarian cyst    Polycystic kidney disease    Positive GBS test 09/23/2016   Preeclampsia    Pregnancy affected by fetal growth restriction 09/20/2016   Baby B. Getting weekly dopplers, AFI.    Pregnancy induced hypertension     History of Present Illness  Robin Horn is a 38 y.o. female with a PMH of left breast CA 8/22 s/p bilateral mastectomy with reconstruction and chemotherapy obesity, bipolar disorder who presents today for follow-up.  Robin Horn was seen initially by Dr. Bensimhon on 07/01/2021 for evaluation of echo and to discuss possible cardiotoxicity related to Herceptin .  She underwent a TTE on 06/08/2021 as well as 10/24/2021 EF of 55-60% with no RWMA and no significant valve abnormalities.  She completed her most recent 2D echo on 01/27/2022 that revealed low normal EF of 50-55%.  She was last seen by Dr. Renna Cary on 03/01/2022 for follow-up and discussion of 2D echo results.  During visit she reported some mild  shortness of breath not any chest pain.  She was on lisinopril  10 mg and tolerating well and was started on Toprol -XL 25 mg daily.  She was advised to continue chemotherapy per her oncologist.    Patient denies chest pain, palpitations, dyspnea, PND, orthopnea, nausea, vomiting, dizziness, syncope, edema, weight gain, or early satiety.   Discussed the use of AI scribe software for clinical note transcription with the patient, who gave verbal consent to proceed.  History of Present Illness    ***Notes: Updated 2D echo to evaluate LV function   Review of Systems  Please see the history of present illness.    All other systems reviewed and are otherwise negative except as noted above.  Physical Exam     Wt Readings from Last 3 Encounters:  08/09/23 164 lb (74.4 kg)  04/28/23 150 lb (68 kg)  04/06/23 149 lb 11.2 oz (67.9 kg)   AC:ZYSAY were no vitals filed for this visit.,There is no height or weight on file to calculate BMI. GEN: Well nourished, well developed in no acute distress Neck: No JVD; No carotid bruits Pulmonary: Clear to auscultation without rales, wheezing or rhonchi  Cardiovascular: Normal rate. Regular rhythm. Normal S1. Normal S2.   Murmurs: There is no murmur.  ABDOMEN: Soft, non-tender, non-distended EXTREMITIES:  No edema; No deformity   EKG/LABS/ Recent Cardiac Studies   ECG personally reviewed  by me today - ***  Risk Assessment/Calculations:   {Does this patient have ATRIAL FIBRILLATION?:[phone]}      Lab Results  Component Value Date   WBC 10.0 02/23/2023   HGB 11.6 02/23/2023   HCT 37.2 02/23/2023   MCV 91 02/23/2023   PLT 276 02/23/2023   Lab Results  Component Value Date   CREATININE 1.68 (H) 02/23/2023   BUN 18 02/23/2023   NA 142 02/23/2023   K 4.1 02/23/2023   CL 103 02/23/2023   CO2 23 02/23/2023   No results found for: "CHOL", "HDL", "LDLCALC", "LDLDIRECT", "TRIG", "CHOLHDL"  No results found for: "HGBA1C" Assessment & Plan     Assessment and Plan Assessment & Plan     1.  Neoplasm of the left breast: -s/p chemotherapy with XRT and bilateral mastectomy   2.  Decreased ejection fraction: - Patient's last 2D echo was completed on 01/27/2022   3.***  4.***      Disposition: Follow-up with None or APP in *** months {Are you ordering a CV Procedure (e.g. stress test, cath, DCCV, TEE, etc)?   Press F2        :[id number]}   Signed, Francene Ing, Retha Cast, NP 09/17/2023, 7:46 AM [facility]

## 2023-09-18 ENCOUNTER — Ambulatory Visit: Attending: Nurse Practitioner | Admitting: Nurse Practitioner

## 2023-09-20 ENCOUNTER — Other Ambulatory Visit: Payer: Self-pay | Admitting: Internal Medicine

## 2023-09-24 ENCOUNTER — Other Ambulatory Visit (HOSPITAL_COMMUNITY): Payer: Self-pay

## 2023-09-25 ENCOUNTER — Other Ambulatory Visit (HOSPITAL_COMMUNITY): Payer: Self-pay

## 2023-09-25 ENCOUNTER — Other Ambulatory Visit: Payer: Self-pay

## 2023-09-25 DIAGNOSIS — M549 Dorsalgia, unspecified: Secondary | ICD-10-CM | POA: Diagnosis not present

## 2023-09-25 DIAGNOSIS — F419 Anxiety disorder, unspecified: Secondary | ICD-10-CM | POA: Diagnosis not present

## 2023-09-25 DIAGNOSIS — E669 Obesity, unspecified: Secondary | ICD-10-CM | POA: Diagnosis not present

## 2023-09-25 DIAGNOSIS — E559 Vitamin D deficiency, unspecified: Secondary | ICD-10-CM | POA: Diagnosis not present

## 2023-09-25 DIAGNOSIS — M542 Cervicalgia: Secondary | ICD-10-CM | POA: Diagnosis not present

## 2023-09-25 DIAGNOSIS — G8929 Other chronic pain: Secondary | ICD-10-CM | POA: Diagnosis not present

## 2023-09-25 DIAGNOSIS — Z1159 Encounter for screening for other viral diseases: Secondary | ICD-10-CM | POA: Diagnosis not present

## 2023-09-25 DIAGNOSIS — F17210 Nicotine dependence, cigarettes, uncomplicated: Secondary | ICD-10-CM | POA: Diagnosis not present

## 2023-09-25 DIAGNOSIS — M129 Arthropathy, unspecified: Secondary | ICD-10-CM | POA: Diagnosis not present

## 2023-09-25 DIAGNOSIS — Z131 Encounter for screening for diabetes mellitus: Secondary | ICD-10-CM | POA: Diagnosis not present

## 2023-09-25 DIAGNOSIS — Z79899 Other long term (current) drug therapy: Secondary | ICD-10-CM | POA: Diagnosis not present

## 2023-09-26 ENCOUNTER — Institutional Professional Consult (permissible substitution): Admitting: Plastic Surgery

## 2023-09-27 ENCOUNTER — Other Ambulatory Visit: Payer: Self-pay

## 2023-09-27 DIAGNOSIS — Z79899 Other long term (current) drug therapy: Secondary | ICD-10-CM | POA: Diagnosis not present

## 2023-09-27 MED ORDER — ZOLPIDEM TARTRATE 5 MG PO TABS: 5.0000 mg | ORAL_TABLET | Freq: Every evening | ORAL | 0 refills | Status: DC | PRN

## 2023-09-28 ENCOUNTER — Other Ambulatory Visit: Payer: Self-pay

## 2023-10-01 DIAGNOSIS — F17210 Nicotine dependence, cigarettes, uncomplicated: Secondary | ICD-10-CM | POA: Diagnosis not present

## 2023-10-01 DIAGNOSIS — G8929 Other chronic pain: Secondary | ICD-10-CM | POA: Diagnosis not present

## 2023-10-01 DIAGNOSIS — N1831 Chronic kidney disease, stage 3a: Secondary | ICD-10-CM | POA: Diagnosis not present

## 2023-10-01 DIAGNOSIS — M549 Dorsalgia, unspecified: Secondary | ICD-10-CM | POA: Diagnosis not present

## 2023-10-01 DIAGNOSIS — Z79899 Other long term (current) drug therapy: Secondary | ICD-10-CM | POA: Diagnosis not present

## 2023-10-01 DIAGNOSIS — F419 Anxiety disorder, unspecified: Secondary | ICD-10-CM | POA: Diagnosis not present

## 2023-10-01 DIAGNOSIS — M542 Cervicalgia: Secondary | ICD-10-CM | POA: Diagnosis not present

## 2023-10-01 DIAGNOSIS — E669 Obesity, unspecified: Secondary | ICD-10-CM | POA: Diagnosis not present

## 2023-10-01 DIAGNOSIS — R7303 Prediabetes: Secondary | ICD-10-CM | POA: Diagnosis not present

## 2023-10-01 DIAGNOSIS — E559 Vitamin D deficiency, unspecified: Secondary | ICD-10-CM | POA: Diagnosis not present

## 2023-10-03 DIAGNOSIS — Z79899 Other long term (current) drug therapy: Secondary | ICD-10-CM | POA: Diagnosis not present

## 2023-10-10 ENCOUNTER — Telehealth: Payer: Self-pay

## 2023-10-10 NOTE — Telephone Encounter (Signed)
 I called pt back to notify her that I have received the disability form, but no answer. I left message to call the clinic back.

## 2023-10-10 NOTE — Telephone Encounter (Signed)
 Notified the pt to le her Know that her forms were faxed to her Primary Provider, due to the fact that questions that were needing to be answered were not related  to her Diagnosis. Pt verbalized understanding. I did emailed the pt a ROI form to sign and date. No questions or concerns at this time.

## 2023-10-10 NOTE — Telephone Encounter (Signed)
 Copied from CRM [id number]. Topic: Medical Record Request - Attorney/Litigation >> Oct 10, 2023 11:29 AM Tisa Forester wrote: Reason for CRM:  [facility] disability department - [phone] calling to forward the form to her provider from [facility] associate attorney at law , there is a form that need to be completed by provider  per Alverta Avers at [facility] recieved the form on 10/08/23 and reach out to the [facility] on 10/10/23 going to fax over to make sure the form get to right person for completion    Route to Research officer, political party.

## 2023-10-11 ENCOUNTER — Telehealth: Payer: Self-pay

## 2023-10-11 ENCOUNTER — Ambulatory Visit (INDEPENDENT_AMBULATORY_CARE_PROVIDER_SITE_OTHER): Admitting: Family

## 2023-10-11 ENCOUNTER — Telehealth: Payer: Self-pay | Admitting: Family

## 2023-10-11 VITALS — BP 134/85 | HR 88 | Temp 98.3°F | Resp 16 | Ht 61.0 in | Wt 168.2 lb

## 2023-10-11 DIAGNOSIS — F319 Bipolar disorder, unspecified: Secondary | ICD-10-CM

## 2023-10-11 DIAGNOSIS — F4321 Adjustment disorder with depressed mood: Secondary | ICD-10-CM

## 2023-10-11 DIAGNOSIS — Z599 Problem related to housing and economic circumstances, unspecified: Secondary | ICD-10-CM | POA: Diagnosis not present

## 2023-10-11 DIAGNOSIS — I10 Essential (primary) hypertension: Secondary | ICD-10-CM | POA: Diagnosis not present

## 2023-10-11 DIAGNOSIS — Z419 Encounter for procedure for purposes other than remedying health state, unspecified: Secondary | ICD-10-CM | POA: Diagnosis not present

## 2023-10-11 DIAGNOSIS — F4310 Post-traumatic stress disorder, unspecified: Secondary | ICD-10-CM

## 2023-10-11 DIAGNOSIS — Z139 Encounter for screening, unspecified: Secondary | ICD-10-CM

## 2023-10-11 DIAGNOSIS — Z7689 Persons encountering health services in other specified circumstances: Secondary | ICD-10-CM

## 2023-10-11 MED ORDER — LISINOPRIL 10 MG PO TABS: 10.0000 mg | ORAL_TABLET | Freq: Every day | ORAL | 0 refills | Status: AC

## 2023-10-11 NOTE — Telephone Encounter (Signed)
 Noted

## 2023-10-11 NOTE — Progress Notes (Signed)
 Referrals and resources for SDOH, Patient scored a 11 on the PHQ-9 Patient scored a 11 on the GAD 7, Patient has mouth and back pain

## 2023-10-11 NOTE — Telephone Encounter (Signed)
 Message received from Lavona Pounds, NP:  Patient states she is 3 months behind in rent ($800/month) and due to be evicted on 10/29/2023. Patient states her light bill is in her son's name (recently murdered). Patient states when she tried to change to light bill to her name she was told the account is locked. If you will please call patient to provide assistance and community resources.    I called the patient and she explained that she owes $2400 in rent.  She said she receives $725/ month for her daughter who is disabled and she applies that for the rent in addition to financial support from her mother but she has fallen behind. She received a notice from the property manager stating that she needs to be out of the home by 10/29/2023 if she is not able to pay all of her rent that is due and unfortunately she does not have money for rent at this time. I told her that I can place a referral to [facility])  for possible assistance with working with her Investment banker, corporate, there is no guarantee that they can help; but we can try.  She was agreeable to placing that referral and was very appreciative of any help that can be provided.  She provided me with the property manager's contact information: Mara Seminole ( she could not remember his last name) [phone].  Referral then placed to [facility].   We discussed the status of her electric bill and she said her power was cut off today.  She stated she has been in contact with [employer] and their funds for assistance are not available until the beginning of the month, She said DSS does not have any funds available. She then stated that she wants to get the situation with her rent taken care of before addressing the issue with utilities.  Her bill with [employer] is > $1000.  I see that a referral has been placed to [facility] for assistance with SDOH needs.  I asked her if she was interested in grief counseling for the loss of her son and she said she was and again was  very appreciative of the assistance.  I placed a referral to [facility] for LCSW- grief counseling   I told her to call me if she has any questions

## 2023-10-11 NOTE — Progress Notes (Signed)
 Subjective:    Robin Horn - 38 y.o. female MRN [id number]  Date of birth: 04-Jun-1985  HPI  Robin Horn is to establish care.   Current issues and/or concerns: - High blood pressure. Doing well on Lisinopril , no issues/concerns. She does not complain of red flag symptoms such as but not limited to chest pain, shortness of breath, worst headache of life, nausea/vomiting.  - Bipolar depression and post-traumatic stress syndrome. States her son was murdered in February 2025. She denies thoughts of self-harm, suicidal ideations, homicidal ideations. - States she is 3 months behind in rent ($800/month) and due to be evicted on 10/29/2023. States her light bill is in her son's name (recently murdered). States when she tried to change to light bill to her name she was told the account is locked.  - No further issues/concerns for discussion today.  ROS per HPI    Health Maintenance:  There are no preventive care reminders to display for this patient.    Past Medical History: Patient Active Problem List   Diagnosis Date Noted   PTSD (post-traumatic stress disorder) 07/24/2023   MDD (major depressive disorder), recurrent episode, severe (HCC) 07/24/2023   Grief 07/19/2023   Constipation 02/25/2023   Anxiety and depression 12/27/2022   Menorrhagia with irregular cycle 12/27/2022   Muscle spasm 10/12/2021   Housing instability 10/12/2021   S/P breast reconstruction 05/23/2021   Caries 04/12/2021   Teeth missing 04/12/2021   Accretions on teeth 04/12/2021   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   Port-A-Cath in place 01/18/2021   Malignant neoplasm of upper-outer quadrant of left breast in female, estrogen receptor positive (HCC) 12/23/2020   Hypertension 11/15/2020   Bipolar 1 disorder (HCC) 05/25/2016   Insomnia 05/25/2016      Social History   reports that she has been smoking cigarettes. She has never used smokeless tobacco.  She reports that she does not currently use alcohol  after a past usage of about 1.0 standard drink of alcohol  per week. She reports that she does not currently use drugs.   Family History  family history includes Breast cancer in her maternal aunt and another family member; Kidney disease in her mother.   Medications: reviewed and updated   Objective:   Physical Exam BP 134/85   Pulse 88   Temp 98.3 F (36.8 C) (Oral)   Resp 16   Ht 5' 1 (1.549 m)   Wt 168 lb 3.2 oz (76.3 kg)   LMP  (Exact Date)   SpO2 98%   BMI 31.78 kg/m   Physical Exam HENT:     Head: Normocephalic and atraumatic.     Nose: Nose normal.     Mouth/Throat:     Mouth: Mucous membranes are moist.     Pharynx: Oropharynx is clear.   Eyes:     Extraocular Movements: Extraocular movements intact.     Conjunctiva/sclera: Conjunctivae normal.     Pupils: Pupils are equal, round, and reactive to light.    Cardiovascular:     Rate and Rhythm: Normal rate and regular rhythm.     Pulses: Normal pulses.     Heart sounds: Normal heart sounds.  Pulmonary:     Effort: Pulmonary effort is normal.     Breath sounds: Normal breath sounds.   Musculoskeletal:        General: Normal range of motion.     Cervical back: Normal range of motion and neck supple.   Neurological:  General: No focal deficit present.     Mental Status: She is alert and oriented to person, place, and time.   Psychiatric:        Mood and Affect: Mood normal.        Behavior: Behavior normal.       Assessment & Plan:  1. Encounter to establish care (Primary) - Patient presents today to establish care. During the interim follow-up with primary provider as scheduled.  - Return for annual physical examination, labs, and health maintenance. Arrive fasting meaning having no food for at least 8 hours prior to appointment. You may have only water  or black coffee. Please take scheduled medications as normal.  2. Primary hypertension -  Continue Lisinopril  as prescribed.  - Routine screening.  - Counseled on blood pressure goal of less than 130/80, low-sodium, DASH diet, medication compliance, and 150 minutes of moderate intensity exercise per week as tolerated. Counseled on medication adherence and adverse effects. - Follow-up with primary provider in 3 months or sooner if needed. - Basic Metabolic Panel - lisinopril  (ZESTRIL ) 10 MG tablet; Take 1 tablet (10 mg total) by mouth daily.  Dispense: 90 tablet; Refill: 0  3. Bipolar depression (HCC) 4. PTSD (post-traumatic stress disorder) - Patient denies thoughts of self-harm, suicidal ideations, homicidal ideations. - Referral to Psychiatry for evaluation/management.  - Follow-up with primary provider as scheduled. - Ambulatory referral to Psychiatry  5. Financial difficulties - Message sent to our office RN case manager for community resources.  6. Encounter for screening involving social determinants of health (SDoH) - Referral to [facility] Care Management for community resources. - AMB Referral [facility] Care Management    Patient was given clear instructions to go to Emergency Department or return to medical center if symptoms don't improve, worsen, or new problems develop.The patient verbalized understanding.  I discussed the assessment and treatment plan with the patient. The patient was provided an opportunity to ask questions and all were answered. The patient agreed with the plan and demonstrated an understanding of the instructions.   The patient was advised to call back or seek an in-person evaluation if the symptoms worsen or if the condition fails to improve as anticipated.    Lavona Pounds, NP 10/11/2023, 10:21 AM Primary Care at [facility]

## 2023-10-12 ENCOUNTER — Encounter: Payer: Self-pay | Admitting: Hematology and Oncology

## 2023-10-12 ENCOUNTER — Other Ambulatory Visit: Payer: Self-pay | Admitting: Internal Medicine

## 2023-10-12 ENCOUNTER — Ambulatory Visit: Payer: Self-pay | Admitting: Family

## 2023-10-12 ENCOUNTER — Telehealth: Payer: Self-pay | Admitting: *Deleted

## 2023-10-12 DIAGNOSIS — N1832 Chronic kidney disease, stage 3b: Secondary | ICD-10-CM

## 2023-10-12 LAB — BASIC METABOLIC PANEL WITH GFR
BUN/Creatinine Ratio: 14 (ref 9–23)
BUN: 23 mg/dL — ABNORMAL HIGH (ref 6–20)
CO2: 19 mmol/L — ABNORMAL LOW (ref 20–29)
Calcium: 9 mg/dL (ref 8.7–10.2)
Chloride: 106 mmol/L (ref 96–106)
Creatinine, Ser: 1.65 mg/dL — ABNORMAL HIGH (ref 0.57–1.00)
Glucose: 84 mg/dL (ref 70–99)
Potassium: 4.2 mmol/L (ref 3.5–5.2)
Sodium: 140 mmol/L (ref 134–144)
eGFR: 41 mL/min/{1.73_m2} — ABNORMAL LOW (ref >59)

## 2023-10-12 NOTE — Telephone Encounter (Signed)
 Copied from CRM [id number]. Topic: General - Call Back - No Documentation >> Oct 12, 2023 11:29 AM Elle L wrote:  Reason for CRM: The patient states she missed a call from Jerryl Morin this morning but I did not see this reflected in her chart. I reached out to the office who advised that Jerryl Morin would be back in the office on Monday. The patient expressed understanding. Her call back number is [phone].

## 2023-10-12 NOTE — Telephone Encounter (Signed)
 Noted

## 2023-10-12 NOTE — Progress Notes (Unsigned)
 Complex Care Management Note Care Guide Note  10/12/2023 Name: Robin Horn MRN: [id number] DOB: 06-24-1985   Complex Care Management Outreach Attempts: An unsuccessful telephone outreach was attempted today to offer the patient information about available complex care management services.  Follow Up Plan:  Additional outreach attempts will be made to offer the patient complex care management information and services.   Encounter Outcome:  No Answer  Barnie Bora  [facility]  [facility], [facility] Guide  Direct Dial: [phone]  Fax [phone]

## 2023-10-15 ENCOUNTER — Encounter: Payer: Self-pay | Admitting: Student

## 2023-10-15 ENCOUNTER — Other Ambulatory Visit: Payer: Self-pay | Admitting: Student

## 2023-10-15 ENCOUNTER — Telehealth: Payer: Self-pay

## 2023-10-15 DIAGNOSIS — K0530 Chronic periodontitis, unspecified: Secondary | ICD-10-CM

## 2023-10-15 NOTE — Telephone Encounter (Unsigned)
 Copied from CRM [id number]. Topic: Clinical - Medication Refill >> Oct 15, 2023  4:17 PM Retta Caster wrote: Medication: doxycycline  (VIBRAMYCIN ) 100 MG capsule [[id number]] ALPRAZolam  (XANAX ) 0.5 MG tablet [[id number]]   Has the patient contacted their pharmacy? Yes (Agent: If no, request that the patient contact the pharmacy for the refill. If patient does not wish to contact the pharmacy document the reason why and proceed with request.) (Agent: If yes, when and what did the pharmacy advise?)  This is the patient's preferred pharmacy:  [facility] [address] ([address] [place] - [address] [place] ([place] [address] Phone: [phone] Fax: [phone]  Is this the correct pharmacy for this prescription? Yes If no, delete pharmacy and type the correct one.   Has the prescription been filled recently? Yes  Is the patient out of the medication? Yes  Has the patient been seen for an appointment in the last year OR does the patient have an upcoming appointment? Yes  Can we respond through MyChart? Yes  Agent: Please be advised that Rx refills may take up to 3 business days. We ask that you follow-up with your pharmacy.

## 2023-10-15 NOTE — Progress Notes (Signed)
 Complex Care Management Note Care Guide Note  10/15/2023 Name: Robin Horn MRN: [id number] DOB: 03-Apr-1986   Complex Care Management Outreach Attempts: A second unsuccessful outreach was attempted today to offer the patient with information about available complex care management services.  Follow Up Plan:  Additional outreach attempts will be made to offer the patient complex care management information and services.   Encounter Outcome:  No Answer  Barnie Bora  [facility]  [facility], [facility] Guide  Direct Dial: [phone]  Fax [phone]

## 2023-10-15 NOTE — Progress Notes (Signed)
   Telephone encounter was:  Successful.  Complex Care Management Note Care Guide Note  10/15/2023 Name: Robin Horn MRN: [id number] DOB: April 26, 1986  Robin Horn is a 38 y.o. year old female who is a primary care patient of Senaida Dama, NP . The community resource team was consulted for assistance with Food Insecurity and Financial Difficulties related to financial strain.   SDOH screenings and interventions completed:  Yes        Care guide performed the following interventions: Patient provided with information about care guide support team and interviewed to confirm resource needs.Patient is about to be evicted in 2 weeks and pt also asked for food resources. I have given Housing and eviction resources over the phone and Pt also has online resources for food. [employer] and [place].   Follow Up Plan:  No further follow up planned at this time. The patient has been provided with needed resources.  Encounter Outcome:  Patient Visit Completed    Azell Leopard [facility]  [facility] Guide, Phone: [phone] Fax: [phone] Website: [email or web address]

## 2023-10-15 NOTE — Progress Notes (Signed)
 Complex Care Management Note  Care Guide Note 10/15/2023 Name: [name] MRN: [id number] DOB: 03-21-1986  [name] is a 38 y.o. year old female who sees Senaida Dama, NP for primary care. I reached out to Rosalynn Come by phone today to offer complex care management services.  Ms. Nester was given information about Complex Care Management services today including:   The Complex Care Management services include support from the care team which includes your Nurse Care Manager, Clinical Social Worker, or Pharmacist.  The Complex Care Management team is here to help remove barriers to the health concerns and goals most important to you. Complex Care Management services are voluntary, and the patient may decline or stop services at any time by request to their care team member.   Complex Care Management Consent Status: Patient agreed to services and verbal consent obtained.   Follow up plan:  Telephone appointment with complex care management team member scheduled for:  10/17/23 Routed referral to community resources for food resources.  Encounter Outcome:  Patient Scheduled  Barnie Bora  [facility]  [facility], [facility] Guide  Direct Dial: [phone]  Fax [phone]

## 2023-10-15 NOTE — Telephone Encounter (Signed)
 I returned the call to the patient and she said she spoke to the person who called her Friday. She said she is doing okay, waiting to hear from [employer]

## 2023-10-15 NOTE — Telephone Encounter (Signed)
 NO LONGER [facility] PATIENT

## 2023-10-16 ENCOUNTER — Other Ambulatory Visit: Payer: Self-pay | Admitting: Family

## 2023-10-16 DIAGNOSIS — R7303 Prediabetes: Secondary | ICD-10-CM | POA: Diagnosis not present

## 2023-10-16 DIAGNOSIS — F17210 Nicotine dependence, cigarettes, uncomplicated: Secondary | ICD-10-CM | POA: Diagnosis not present

## 2023-10-16 DIAGNOSIS — K0889 Other specified disorders of teeth and supporting structures: Secondary | ICD-10-CM

## 2023-10-16 DIAGNOSIS — E559 Vitamin D deficiency, unspecified: Secondary | ICD-10-CM | POA: Diagnosis not present

## 2023-10-16 DIAGNOSIS — F419 Anxiety disorder, unspecified: Secondary | ICD-10-CM | POA: Diagnosis not present

## 2023-10-16 DIAGNOSIS — M549 Dorsalgia, unspecified: Secondary | ICD-10-CM | POA: Diagnosis not present

## 2023-10-16 DIAGNOSIS — E669 Obesity, unspecified: Secondary | ICD-10-CM | POA: Diagnosis not present

## 2023-10-16 DIAGNOSIS — Z79899 Other long term (current) drug therapy: Secondary | ICD-10-CM | POA: Diagnosis not present

## 2023-10-16 DIAGNOSIS — N1831 Chronic kidney disease, stage 3a: Secondary | ICD-10-CM | POA: Diagnosis not present

## 2023-10-16 MED ORDER — AMOXICILLIN-POT CLAVULANATE 875-125 MG PO TABS
1.0000 | ORAL_TABLET | Freq: Two times a day (BID) | ORAL | 0 refills | Status: DC
Start: 2023-10-16 — End: 2023-11-24

## 2023-10-16 NOTE — Telephone Encounter (Signed)
 noted

## 2023-10-16 NOTE — Telephone Encounter (Signed)
 Complete

## 2023-10-17 ENCOUNTER — Telehealth: Payer: Self-pay

## 2023-10-18 DIAGNOSIS — Z79899 Other long term (current) drug therapy: Secondary | ICD-10-CM | POA: Diagnosis not present

## 2023-10-18 NOTE — Telephone Encounter (Signed)
 I called and made sure patient pick up her antibiotic and she has picked it up

## 2023-10-19 ENCOUNTER — Telehealth: Payer: Self-pay

## 2023-10-19 NOTE — Patient Outreach (Signed)
 Patient returned LCSW call from 10/17/2023. Patient explained that her phone has not been working properly. LCSW explained the reason for the referral. Patient explained that her SDOH needs have been met and she did not want to seek services for her grief and loss. Patient identified no other needs. Referral has been closed. LCSW informed patient if future needs arise to please reach out.  Georgine Kitchens, MSW, LCSW [place]  [facility], [facility] Health Licensed Clinical Social Worker Direct Dial: [phone]

## 2023-10-21 ENCOUNTER — Encounter: Payer: Self-pay | Admitting: Student

## 2023-10-22 NOTE — Telephone Encounter (Signed)
 Please send message to Dr. Addie as I am unaware of what the provider-patient discussion was regarding discontinuation of her anxiety depression medication. During the interim report to the Emergency Department/Urgent Care/call 911 for immediate medical evaluation.

## 2023-10-24 ENCOUNTER — Institutional Professional Consult (permissible substitution): Admitting: Plastic Surgery

## 2023-10-29 DIAGNOSIS — M549 Dorsalgia, unspecified: Secondary | ICD-10-CM | POA: Diagnosis not present

## 2023-10-29 DIAGNOSIS — E669 Obesity, unspecified: Secondary | ICD-10-CM | POA: Diagnosis not present

## 2023-10-29 DIAGNOSIS — Z79899 Other long term (current) drug therapy: Secondary | ICD-10-CM | POA: Diagnosis not present

## 2023-10-29 DIAGNOSIS — F17210 Nicotine dependence, cigarettes, uncomplicated: Secondary | ICD-10-CM | POA: Diagnosis not present

## 2023-10-29 DIAGNOSIS — N1831 Chronic kidney disease, stage 3a: Secondary | ICD-10-CM | POA: Diagnosis not present

## 2023-10-29 DIAGNOSIS — Z91148 Patient's other noncompliance with medication regimen for other reason: Secondary | ICD-10-CM | POA: Diagnosis not present

## 2023-10-29 DIAGNOSIS — F419 Anxiety disorder, unspecified: Secondary | ICD-10-CM | POA: Diagnosis not present

## 2023-10-29 DIAGNOSIS — E559 Vitamin D deficiency, unspecified: Secondary | ICD-10-CM | POA: Diagnosis not present

## 2023-10-29 DIAGNOSIS — R7303 Prediabetes: Secondary | ICD-10-CM | POA: Diagnosis not present

## 2023-11-01 ENCOUNTER — Ambulatory Visit (HOSPITAL_COMMUNITY): Admitting: Licensed Clinical Social Worker

## 2023-11-01 DIAGNOSIS — Z79899 Other long term (current) drug therapy: Secondary | ICD-10-CM | POA: Diagnosis not present

## 2023-11-10 DIAGNOSIS — Z419 Encounter for procedure for purposes other than remedying health state, unspecified: Secondary | ICD-10-CM | POA: Diagnosis not present

## 2023-11-12 ENCOUNTER — Telehealth: Payer: Self-pay | Admitting: Family

## 2023-11-12 ENCOUNTER — Encounter: Admitting: Family

## 2023-11-12 DIAGNOSIS — F419 Anxiety disorder, unspecified: Secondary | ICD-10-CM | POA: Diagnosis not present

## 2023-11-12 DIAGNOSIS — E669 Obesity, unspecified: Secondary | ICD-10-CM | POA: Diagnosis not present

## 2023-11-12 DIAGNOSIS — M549 Dorsalgia, unspecified: Secondary | ICD-10-CM | POA: Diagnosis not present

## 2023-11-12 DIAGNOSIS — E559 Vitamin D deficiency, unspecified: Secondary | ICD-10-CM | POA: Diagnosis not present

## 2023-11-12 DIAGNOSIS — F17210 Nicotine dependence, cigarettes, uncomplicated: Secondary | ICD-10-CM | POA: Diagnosis not present

## 2023-11-12 DIAGNOSIS — Z91148 Patient's other noncompliance with medication regimen for other reason: Secondary | ICD-10-CM | POA: Diagnosis not present

## 2023-11-12 DIAGNOSIS — R7303 Prediabetes: Secondary | ICD-10-CM | POA: Diagnosis not present

## 2023-11-12 DIAGNOSIS — N1831 Chronic kidney disease, stage 3a: Secondary | ICD-10-CM | POA: Diagnosis not present

## 2023-11-12 DIAGNOSIS — Z79899 Other long term (current) drug therapy: Secondary | ICD-10-CM | POA: Diagnosis not present

## 2023-11-12 NOTE — Telephone Encounter (Signed)
 Called pt and left vm to call office back to reschedule missed physical appt

## 2023-11-12 NOTE — Progress Notes (Signed)
 Erroneous encounter-disregard

## 2023-11-14 DIAGNOSIS — Z79899 Other long term (current) drug therapy: Secondary | ICD-10-CM | POA: Diagnosis not present

## 2023-11-20 ENCOUNTER — Other Ambulatory Visit: Payer: Self-pay | Admitting: Student

## 2023-11-20 NOTE — Telephone Encounter (Signed)
 NO LONGER [facility] PATIENT

## 2023-11-22 ENCOUNTER — Institutional Professional Consult (permissible substitution): Admitting: Plastic Surgery

## 2023-11-24 ENCOUNTER — Ambulatory Visit
Admission: RE | Admit: 2023-11-24 | Discharge: 2023-11-24 | Disposition: A | Discharge status: Home / Self Care | Source: Ambulatory Visit

## 2023-11-24 VITALS — BP 134/91 | HR 92 | Temp 98.7°F | Resp 18

## 2023-11-24 DIAGNOSIS — Z760 Encounter for issue of repeat prescription: Secondary | ICD-10-CM

## 2023-11-24 DIAGNOSIS — F32A Depression, unspecified: Secondary | ICD-10-CM | POA: Diagnosis not present

## 2023-11-24 MED ORDER — SERTRALINE HCL 25 MG PO TABS: 25.0000 mg | ORAL_TABLET | Freq: Every day | ORAL | 0 refills | Status: DC

## 2023-11-24 NOTE — Discharge Instructions (Addendum)
 Zoloft  25 mg - once daily every day Keep your appointment with the primary care provider next week

## 2023-11-24 NOTE — ED Provider Notes (Signed)
 [facility]    CSN: [id number] Arrival date & time: 11/24/23  1049      History   Chief Complaint Chief Complaint  Patient presents with   Medication Refill    HPI Robin Horn is a 38 y.o. female.  Presenting for medication refill She is needing prescriptions for tamoxifen , sertraline , alprazolam , zolpidem  Reports she ran out of these 3 weeks ago  She does have a primary care appointment scheduled for 8/4 (in 9 days) She also has an appointment with the behavioral health center on 8/6   Alprazolam  last filled 10/01/2023, #30 tablets  Zoloft  last filled 07/05/2023, #90 tablets Zolpidem  last filled 09/27/2023,  #30 tablets  Tamoxifen  not filled since 04/2023  Past Medical History:  Diagnosis Date   Abnormal Pap smear of cervix    Anemia    Anxiety    Arthritis    Bipolar disorder (HCC)    diagnosed years ago   Cancer (HCC)    Chlamydia    Depression    Drug use affecting pregnancy, antepartum 05/25/2016   History of cocaine use   Encounter for dental examination 04/12/2021   Family history of breast cancer 01/18/2021   GDM (gestational diabetes mellitus)    GERD (gastroesophageal reflux disease)    pt used to take omeprazole, doesn't have much issues with it anymore   Gonorrhea    Herpes genitalia    History of hiatal hernia    History of postpartum hemorrhage, currently pregnant 05/25/2016   History of radiation therapy    Left Chest wall- 09/27/21-11/25/21- Dr. Lynwood Nasuti   Migraines    Ovarian cyst    Polycystic kidney disease    Positive GBS test 09/23/2016   Preeclampsia    Pregnancy affected by fetal growth restriction 09/20/2016   Baby B. Getting weekly dopplers, AFI.    Pregnancy induced hypertension     Patient Active Problem List   Diagnosis Date Noted   PTSD (post-traumatic stress disorder) 07/24/2023   MDD (major depressive disorder), recurrent episode, severe (HCC) 07/24/2023   Grief 07/19/2023   Constipation 02/25/2023    Anxiety and depression 12/27/2022   Menorrhagia with irregular cycle 12/27/2022   Muscle spasm 10/12/2021   Housing instability 10/12/2021   S/P breast reconstruction 05/23/2021   Caries 04/12/2021   Teeth missing 04/12/2021   Accretions on teeth 04/12/2021   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   Port-A-Cath in place 01/18/2021   Malignant neoplasm of upper-outer quadrant of left breast in female, estrogen receptor positive (HCC) 12/23/2020   Hypertension 11/15/2020   Bipolar 1 disorder (HCC) 05/25/2016   Insomnia 05/25/2016    Past Surgical History:  Procedure Laterality Date   BREAST RECONSTRUCTION WITH PLACEMENT OF TISSUE EXPANDER AND FLEX HD (ACELLULAR HYDRATED DERMIS) Bilateral 05/23/2021   Procedure: BREAST RECONSTRUCTION WITH PLACEMENT OF TISSUE EXPANDER AND FLEX HD (ACELLULAR HYDRATED DERMIS);  Surgeon: Elisabeth Craig RAMAN, MD;  Location: [facility] OR;  Service: Plastics;  Laterality: Bilateral;   COLPOSCOPY     DILATION AND CURETTAGE OF UTERUS     MASTECTOMY W/ SENTINEL NODE BIOPSY Left 05/23/2021   Procedure: LEFT MASTECTOMY WITH LEFT AXILLARY SENTINEL LYMPH NODE BIOPSY;  Surgeon: Ebbie Cough, MD;  Location: [facility] OR;  Service: General;  Laterality: Left;   PORT A CATH REVISION Right 01/31/2021   Procedure: PORT A CATH REVISION;  Surgeon: Ebbie Cough, MD;  Location: [facility];  Service: General;  Laterality: Right;   PORTACATH PLACEMENT N/A  01/06/2021   Procedure: INSERTION PORT-A-CATH;  Surgeon: Ebbie Cough, MD;  Location: [facility] OR;  Service: General;  Laterality: N/A;   RADIOACTIVE SEED GUIDED AXILLARY SENTINEL LYMPH NODE Left 05/23/2021   Procedure: RADIOACTIVE SEED GUIDED LEFT AXILLARY SENTINEL LYMPH NODE EXCISION;  Surgeon: Ebbie Cough, MD;  Location: [facility] OR;  Service: General;  Laterality: Left;   TONSILLECTOMY     TOTAL MASTECTOMY Right 05/23/2021   Procedure: RIGHT TOTAL MASTECTOMY;  Surgeon:  Ebbie Cough, MD;  Location: [facility] OR;  Service: General;  Laterality: Right;   TUBAL LIGATION Bilateral 09/24/2016   Procedure: POST PARTUM TUBAL LIGATION;  Surgeon: Corene Coy, MD;  Location: [facility];  Service: Gynecology;  Laterality: Bilateral;    OB History     Gravida  8   Para  5   Term  4   Preterm  1   AB  3   Living  6      SAB  0   IAB  3   Ectopic  0   Multiple  1   Live Births  6            Home Medications    Prior to Admission medications   Medication Sig Start Date End Date Taking? Authorizing Provider  ALPRAZolam  (XANAX ) 0.5 MG tablet Take 1 tablet (0.5 mg total) by mouth 2 (two) times daily as needed. for anxiety 10/01/23  Yes Addie Perkins, DO  lisinopril  (ZESTRIL ) 10 MG tablet Take 1 tablet (10 mg total) by mouth daily. 10/11/23  Yes Lorren, Amy J, NP  medroxyPROGESTERone  (DEPO-PROVERA ) 150 MG/ML injection Inject 1 mL (150 mg total) into the muscle every 3 (three) months. 04/06/23  Yes Gudena, Vinay, MD  naloxone (NARCAN) nasal spray 4 mg/0.1 mL SMARTSIG:Both Nares 09/28/23  Yes [provider]  oxyCODONE  (OXY IR/ROXICODONE ) 5 MG immediate release tablet Take 5 mg by mouth 4 (four) times daily as needed. 11/13/23  Yes [provider]  senna-docusate (SENOKOT S) 8.6-50 MG tablet Take 1 tablet by mouth at bedtime as needed for mild constipation. 02/23/23  Yes Arellano Zameza, Priscila, MD  sertraline  (ZOLOFT ) 25 MG tablet Take 1 tablet (25 mg total) by mouth daily. 11/24/23  Yes Ashlin Kreps, [place], PA-C  zolpidem  (AMBIEN ) 5 MG tablet Take 1 tablet (5 mg total) by mouth at bedtime as needed. for sleep 09/27/23  Yes Jolaine Pac, DO    Family History Family History  Problem Relation Age of Onset   Kidney disease Mother    Breast cancer Maternal Aunt        dx 91s   Breast cancer Other        MGM's sister and MGM's niece; dx after 11   Other Neg Hx     Social History Social History   Tobacco Use    Smoking status: Former    Current packs/day: 0.30    Types: Cigarettes   Smokeless tobacco: Never   Tobacco comments:    Wants to restart Nicotine   Vaping Use   Vaping status: Some Days  Substance Use Topics   Alcohol  use: Not Currently    Alcohol /week: 1.0 standard drink of alcohol     Types: 1 Glasses of wine per week   Drug use: Not Currently     Allergies   Hydrocodone -acetaminophen , Tramadol , and Lactose intolerance (gi)   Review of Systems Review of Systems   Physical Exam Triage Vital Signs ED Triage Vitals [11/24/23 1107]  Encounter Vitals Group     BP (!) 134/91  Girls Systolic BP Percentile      Girls Diastolic BP Percentile      Boys Systolic BP Percentile      Boys Diastolic BP Percentile      Pulse Rate 92     Resp 18     Temp 98.7 F (37.1 C)     Temp Source Oral     SpO2 99 %     Weight      Height      Head Circumference      Peak Flow      Pain Score      Pain Loc      Pain Education      Exclude from Growth Chart    No data found.  Updated Vital Signs BP (!) 134/91 (BP Location: Left Arm)   Pulse 92   Temp 98.7 F (37.1 C) (Oral)   Resp 18   SpO2 99%   Visual Acuity Right Eye Distance:   Left Eye Distance:   Bilateral Distance:    Right Eye Near:   Left Eye Near:    Bilateral Near:     Physical Exam Vitals and nursing note reviewed.  Constitutional:      General: She is not in acute distress.    Appearance: Normal appearance.  HENT:     Mouth/Throat:     Pharynx: Oropharynx is clear.  Cardiovascular:     Rate and Rhythm: Normal rate and regular rhythm.     Pulses: Normal pulses.     Heart sounds: Normal heart sounds.  Pulmonary:     Effort: Pulmonary effort is normal.     Breath sounds: Normal breath sounds.  Neurological:     Mental Status: She is alert and oriented to person, place, and time.      UC Treatments / Results  Labs (all labs ordered are listed, but only abnormal results are displayed) Labs  Reviewed - No data to display  EKG   Radiology No results found.  Procedures Procedures (including critical care time)  Medications Ordered in UC Medications - No data to display  Initial Impression / Assessment and Plan / UC Course  I have reviewed the triage vital signs and the nursing notes.  Pertinent labs & imaging results that were available during my care of the patient were reviewed by me and considered in my medical decision making (see chart for details).  Discussion with patient I cannot fill the Xanax  or Ambien  prescriptions in the urgent care setting. She has been out of these for 3 weeks  I can fill her zoloft , but recommend to start on lower dose 25 mg daily, since she also stopped this suddenly 3 weeks ago. Will have her primary care increase dosage as needed. She is comfortable with this.   Tamoxifen  has not been filled in almost 8 months. Will have PCP evaluate if this should be continued. Especially given sertraline /tamoxifen  efficacy together it's not sure what dose she will need.  [facility] information is give, 24/7 support Patient agrees to plan, no questions   Final Clinical Impressions(s) / UC Diagnoses   Final diagnoses:  Depression, unspecified depression type  Medication refill     Discharge Instructions      Zoloft  25 mg - once daily every day Keep your appointment with the primary care provider next week     ED Prescriptions     Medication Sig Dispense Auth. Provider   sertraline  (ZOLOFT ) 25 MG tablet Take 1 tablet (25  mg total) by mouth daily. 30 tablet Loney Peto, Asberry, PA-C      I have reviewed the PDMP during this encounter.   Jeryl Asberry, PA-C 11/24/23 1152

## 2023-11-24 NOTE — ED Triage Notes (Signed)
 Pt states she has been out of her mental health meds x 2 weeks. She is set up as a new patient with [facility] and has an appt in August. She is requesting refills of tamoxifen , zoloft , ambien  and alprazolam 

## 2023-12-03 ENCOUNTER — Ambulatory Visit: Payer: Self-pay | Admitting: Student

## 2023-12-03 ENCOUNTER — Telehealth: Payer: Self-pay | Admitting: Family

## 2023-12-03 NOTE — Telephone Encounter (Signed)
 Patient was called due to no showing her appointment this afternoon at our office, but I see that she has another PCP listed.  If she calls back to reschedule, please have her check with her PCP office first for availability.

## 2023-12-04 NOTE — Progress Notes (Deleted)
 Psychiatric Initial Adult Assessment  Patient Identification: Robin Horn MRN:  [id number] Date of Evaluation:  12/04/2023 Referral Source: ***  Assessment:  Robin Horn is a 38 y.o. female with a history of *** PTSD, HTN, CKD3b who presents to [facility] via video conferencing for initial evaluation of ***.  Patient reports ***  Plan:  # *** Past medication trials:  Status of problem: *** Interventions: -- ***  # *** Past medication trials:  Status of problem: *** Interventions: -- ***  # *** Past medication trials:  Status of problem: *** Interventions: -- ***  Patient was given contact information for behavioral health clinic and was instructed to call 911 for emergencies.   Subjective:  Chief Complaint: No chief complaint on file.   History of Present Illness:  ***  Chart review: -- Referred by PCP for bipolar depression, PTSD. Reporting son was murdered Feb 2025.  -- Home psychotropics: ***   Past Psychiatric History:  Diagnoses: ***MDD vs. Bipolar 1 disorder, PTSD Medication trials: ***Zoloft , Abilify, Seroquel , Xanax , Atarax , Ambien  Previous psychiatrist/therapist: *** Hospitalizations: *** Suicide attempts: *** SIB: *** Hx of violence towards others: *** Current access to guns: *** Hx of trauma/abuse: ***  Previous Psychotropic Medications: {YES/NO:21197}  Substance Abuse History in the last 12 months:  {yes no:314532}  -- Tobacco: ***  Past Medical History:  Past Medical History:  Diagnosis Date   Abnormal Pap smear of cervix    Anemia    Anxiety    Arthritis    Bipolar disorder (HCC)    diagnosed years ago   Cancer (HCC)    Chlamydia    Depression    Drug use affecting pregnancy, antepartum 05/25/2016   History of cocaine use   Encounter for dental examination 04/12/2021   Family history of breast cancer 01/18/2021   GDM (gestational diabetes mellitus)    GERD (gastroesophageal reflux disease)    pt  used to take omeprazole, doesn't have much issues with it anymore   Gonorrhea    Herpes genitalia    History of hiatal hernia    History of postpartum hemorrhage, currently pregnant 05/25/2016   History of radiation therapy    Left Chest wall- 09/27/21-11/25/21- Dr. Lynwood Nasuti   Migraines    Ovarian cyst    Polycystic kidney disease    Positive GBS test 09/23/2016   Preeclampsia    Pregnancy affected by fetal growth restriction 09/20/2016   Baby B. Getting weekly dopplers, AFI.    Pregnancy induced hypertension     Past Surgical History:  Procedure Laterality Date   BREAST RECONSTRUCTION WITH PLACEMENT OF TISSUE EXPANDER AND FLEX HD (ACELLULAR HYDRATED DERMIS) Bilateral 05/23/2021   Procedure: BREAST RECONSTRUCTION WITH PLACEMENT OF TISSUE EXPANDER AND FLEX HD (ACELLULAR HYDRATED DERMIS);  Surgeon: Elisabeth Craig RAMAN, MD;  Location: [facility] OR;  Service: Plastics;  Laterality: Bilateral;   COLPOSCOPY     DILATION AND CURETTAGE OF UTERUS     MASTECTOMY W/ SENTINEL NODE BIOPSY Left 05/23/2021   Procedure: LEFT MASTECTOMY WITH LEFT AXILLARY SENTINEL LYMPH NODE BIOPSY;  Surgeon: Ebbie Cough, MD;  Location: [facility] OR;  Service: General;  Laterality: Left;   PORT A CATH REVISION Right 01/31/2021   Procedure: PORT A CATH REVISION;  Surgeon: Ebbie Cough, MD;  Location: [facility];  Service: General;  Laterality: Right;   PORTACATH PLACEMENT N/A 01/06/2021   Procedure: INSERTION PORT-A-CATH;  Surgeon: Ebbie Cough, MD;  Location: [facility] OR;  Service: General;  Laterality: N/A;  RADIOACTIVE SEED GUIDED AXILLARY SENTINEL LYMPH NODE Left 05/23/2021   Procedure: RADIOACTIVE SEED GUIDED LEFT AXILLARY SENTINEL LYMPH NODE EXCISION;  Surgeon: Ebbie Cough, MD;  Location: [facility] OR;  Service: General;  Laterality: Left;   TONSILLECTOMY     TOTAL MASTECTOMY Right 05/23/2021   Procedure: RIGHT TOTAL MASTECTOMY;  Surgeon: Ebbie Cough, MD;  Location: [facility] OR;  Service: General;   Laterality: Right;   TUBAL LIGATION Bilateral 09/24/2016   Procedure: POST PARTUM TUBAL LIGATION;  Surgeon: Corene Coy, MD;  Location: [facility];  Service: Gynecology;  Laterality: Bilateral;    Family Psychiatric History: ***  Family History:  Family History  Problem Relation Age of Onset   Kidney disease Mother    Breast cancer Maternal Aunt        dx 4s   Breast cancer Other        MGM's sister and MGM's niece; dx after 28   Other Neg Hx     Social History:   Academic/Vocational: ***  Social History   Socioeconomic History   Marital status: Single    Spouse name: Not on file   Number of children: Not on file   Years of education: Not on file   Highest education level: 9th grade  Occupational History   Not on file  Tobacco Use   Smoking status: Former    Current packs/day: 0.30    Types: Cigarettes   Smokeless tobacco: Never   Tobacco comments:    Wants to restart Nicotine   Vaping Use   Vaping status: Some Days  Substance and Sexual Activity   Alcohol  use: Not Currently    Alcohol /week: 1.0 standard drink of alcohol     Types: 1 Glasses of wine per week   Drug use: Not Currently   Sexual activity: Yes    Birth control/protection: None, Surgical    Comment: Tubal  Other Topics Concern   Not on file  Social History Narrative   Not on file   Social Drivers of Health   Financial Resource Strain: High Risk (10/15/2023)   Overall Financial Resource Strain (CARDIA)    Difficulty of Paying Living Expenses: Very hard  Food Insecurity: Food Insecurity Present (10/15/2023)   Hunger Vital Sign    Worried About Running Out of Food in the Last Year: Not on file    Ran Out of Food in the Last Year: Often true  Transportation Needs: Unmet Transportation Needs (10/11/2023)   PRAPARE - Transportation    Lack of Transportation (Medical): Yes    Lack of Transportation (Non-Medical): Yes  Physical Activity: Inactive (10/11/2023)   Exercise Vital Sign     Days of Exercise per Week: 0 days    Minutes of Exercise per Session: Not on file  Stress: Stress Concern Present (10/11/2023)   [employer] of Occupational Health - Occupational Stress Questionnaire    Feeling of Stress: Very much  Social Connections: Socially Isolated (10/11/2023)   Social Connection and Isolation Panel    Frequency of Communication with Friends and Family: More than three times a week    Frequency of Social Gatherings with Friends and Family: More than three times a week    Attends Religious Services: Never    Database administrator or Organizations: No    Attends Engineer, structural: Not on file    Marital Status: Never married    Additional Social History: updated  Allergies:   Allergies  Allergen Reactions   Hydrocodone -Acetaminophen  Hives   Tramadol  Itching  Lactose Intolerance (Gi) Diarrhea    Current Medications: Current Outpatient Medications  Medication Sig Dispense Refill   ALPRAZolam  (XANAX ) 0.5 MG tablet Take 1 tablet (0.5 mg total) by mouth 2 (two) times daily as needed. for anxiety 30 tablet 0   lisinopril  (ZESTRIL ) 10 MG tablet Take 1 tablet (10 mg total) by mouth daily. 90 tablet 0   medroxyPROGESTERone  (DEPO-PROVERA ) 150 MG/ML injection Inject 1 mL (150 mg total) into the muscle every 3 (three) months. 1 mL 3   naloxone (NARCAN) nasal spray 4 mg/0.1 mL SMARTSIG:Both Nares     oxyCODONE  (OXY IR/ROXICODONE ) 5 MG immediate release tablet Take 5 mg by mouth 4 (four) times daily as needed.     senna-docusate (SENOKOT S) 8.6-50 MG tablet Take 1 tablet by mouth at bedtime as needed for mild constipation. 30 tablet 0   sertraline  (ZOLOFT ) 25 MG tablet Take 1 tablet (25 mg total) by mouth daily. 30 tablet 0   zolpidem  (AMBIEN ) 5 MG tablet Take 1 tablet (5 mg total) by mouth at bedtime as needed. for sleep 30 tablet 0   No current facility-administered medications for this visit.    ROS: Review of  Systems  Objective:  Psychiatric Specialty Exam: There were no vitals taken for this visit.There is no height or weight on file to calculate BMI.  General Appearance: {Appearance:22683}  Eye Contact:  {BHH EYE CONTACT:22684}  Speech:  {Speech:22685}  Volume:  {Volume (PAA):22686}  Mood:  {BHH MOOD:22306}  Affect:  {Affect (PAA):22687}  Thought Content: {Thought Content:22690}   Suicidal Thoughts:  {ST/HT (PAA):22692}  Homicidal Thoughts:  {ST/HT (PAA):22692}  Thought Process:  {Thought Process (PAA):22688}  Orientation:  {BHH ORIENTATION (PAA):22689}    Memory: Grossly intact ***  Judgment:  {Judgement (PAA):22694}  Insight:  {Insight (PAA):22695}  Concentration:  {Concentration:21399}  Recall:  not formally assessed ***  Fund of Knowledge: {BHH GOOD/FAIR/POOR:22877}  Language: {BHH GOOD/FAIR/POOR:22877}  Psychomotor Activity:  {Psychomotor (PAA):22696}  Akathisia:  {BHH YES OR NO:22294}  AIMS (if indicated): {Desc; done/not:10129}  Assets:  {Assets (PAA):22698}  ADL's:  {BHH JIO'D:77709}  Cognition: {chl bhh cognition:304700322}  Sleep:  {BHH GOOD/FAIR/POOR:22877}   PE: General: sits comfortably in view of camera; no acute distress *** Pulm: no increased work of breathing on room air *** MSK: all extremity movements appear intact *** Neuro: no focal neurological deficits observed *** Gait & Station: unable to assess by video ***   Metabolic Disorder Labs: No results found for: HGBA1C, MPG No results found for: PROLACTIN No results found for: CHOL, TRIG, HDL, CHOLHDL, VLDL, LDLCALC Lab Results  Component Value Date   TSH 3.300 02/23/2023    Therapeutic Level Labs: No results found for: LITHIUM No results found for: CBMZ No results found for: VALPROATE  Screenings:  GAD-7    Flowsheet Row Office Visit from 10/11/2023 in [facility] Office Visit from 08/09/2023 in [facility] - A Dept Of [place]  [facility] Office Visit from 02/23/2023 in [facility] - A Dept Of [place]. [facility] Routine Prenatal from 09/20/2016 in Center for [facility] Routine Prenatal from 09/13/2016 in Center for [facility]  Total GAD-7 Score 11 14 21 21  0   PHQ2-9    Flowsheet Row Office Visit from 10/11/2023 in [facility] Office Visit from 08/09/2023 in [facility] - A Dept Of [place]. [facility] Counselor  from 07/24/2023 in [facility] Office Visit from 02/23/2023 in [facility] - A Dept Of [place]. [facility] Office Visit from 12/27/2022 in [facility] - A Dept Of [place]. [facility]  PHQ-2 Total Score 2 4 6 6 6   PHQ-9 Total Score 11 9 22 22 22    Flowsheet Row UC from 11/24/2023 in [facility] at [facility]) Counselor from 07/24/2023 in [facility] ED from 07/13/2023 in [facility]  C-SSRS RISK CATEGORY Moderate Risk Moderate Risk No Risk    Collaboration of Care: Collaboration of Care: [facility] OP Collaboration of Rjmz:[id number]}  Patient/Guardian was advised Release of Information must be obtained prior to any record release in order to collaborate their care with an outside provider. Patient/Guardian was advised if they have not already done so to contact the registration department to sign all necessary forms in order for us  to release information regarding their care.   Consent: Patient/Guardian gives verbal consent for treatment and assignment of benefits for services provided during this visit. Patient/Guardian expressed understanding and agreed to proceed.   Televisit via video: I connected with Chayse Gracey Gravatt on 12/04/23 at  1:00 PM EDT by a video enabled telemedicine application and verified that I am  speaking with the correct person using two identifiers.  Location: Patient: *** Provider: remote office in [place]   I discussed the limitations of evaluation and management by telemedicine and the availability of in person appointments. The patient expressed understanding and agreed to proceed.  I discussed the assessment and treatment plan with the patient. The patient was provided an opportunity to ask questions and all were answered. The patient agreed with the plan and demonstrated an understanding of the instructions.   The patient was advised to call back or seek an in-person evaluation if the symptoms worsen or if the condition fails to improve as anticipated.  I provided *** minutes dedicated to the care of this patient via video on the date of this encounter to include chart review, face-to-face time with the patient, medication management/counseling ***.  Anysa Tacey A Nollan Muldrow 8/5/20251:07 PM

## 2023-12-05 ENCOUNTER — Ambulatory Visit (HOSPITAL_COMMUNITY): Admitting: Psychiatry

## 2023-12-07 ENCOUNTER — Encounter (HOSPITAL_COMMUNITY): Payer: Self-pay

## 2023-12-07 ENCOUNTER — Ambulatory Visit (HOSPITAL_COMMUNITY): Admitting: Licensed Clinical Social Worker

## 2023-12-11 DIAGNOSIS — Z419 Encounter for procedure for purposes other than remedying health state, unspecified: Secondary | ICD-10-CM | POA: Diagnosis not present

## 2023-12-17 DIAGNOSIS — Z91148 Patient's other noncompliance with medication regimen for other reason: Secondary | ICD-10-CM | POA: Diagnosis not present

## 2023-12-17 DIAGNOSIS — R7303 Prediabetes: Secondary | ICD-10-CM | POA: Diagnosis not present

## 2023-12-17 DIAGNOSIS — N1831 Chronic kidney disease, stage 3a: Secondary | ICD-10-CM | POA: Diagnosis not present

## 2023-12-17 DIAGNOSIS — F419 Anxiety disorder, unspecified: Secondary | ICD-10-CM | POA: Diagnosis not present

## 2023-12-17 DIAGNOSIS — E559 Vitamin D deficiency, unspecified: Secondary | ICD-10-CM | POA: Diagnosis not present

## 2023-12-17 DIAGNOSIS — M549 Dorsalgia, unspecified: Secondary | ICD-10-CM | POA: Diagnosis not present

## 2023-12-17 DIAGNOSIS — E669 Obesity, unspecified: Secondary | ICD-10-CM | POA: Diagnosis not present

## 2023-12-17 DIAGNOSIS — Z79899 Other long term (current) drug therapy: Secondary | ICD-10-CM | POA: Diagnosis not present

## 2023-12-17 DIAGNOSIS — F17210 Nicotine dependence, cigarettes, uncomplicated: Secondary | ICD-10-CM | POA: Diagnosis not present

## 2023-12-19 DIAGNOSIS — Z79899 Other long term (current) drug therapy: Secondary | ICD-10-CM | POA: Diagnosis not present

## 2024-01-01 ENCOUNTER — Other Ambulatory Visit: Payer: Self-pay | Admitting: Hematology and Oncology

## 2024-01-02 ENCOUNTER — Encounter: Payer: Self-pay | Admitting: Hematology and Oncology

## 2024-01-03 ENCOUNTER — Encounter (HOSPITAL_COMMUNITY): Admitting: Psychiatry

## 2024-01-03 ENCOUNTER — Encounter (HOSPITAL_COMMUNITY): Payer: Self-pay

## 2024-01-03 ENCOUNTER — Other Ambulatory Visit: Payer: Self-pay | Admitting: Hematology and Oncology

## 2024-01-03 NOTE — Progress Notes (Signed)
 Patient did not connect for virtual psychiatric medication management appointment on 01/03/24 at 9AM. Sent secure video link with no response. Left VM with callback number to reschedule.  LAURAINE DELENA PUMMEL, MD 01/03/24

## 2024-01-11 DIAGNOSIS — Z419 Encounter for procedure for purposes other than remedying health state, unspecified: Secondary | ICD-10-CM | POA: Diagnosis not present

## 2024-02-06 ENCOUNTER — Telehealth

## 2024-02-06 ENCOUNTER — Other Ambulatory Visit: Payer: Self-pay | Admitting: Family

## 2024-02-06 DIAGNOSIS — F411 Generalized anxiety disorder: Secondary | ICD-10-CM | POA: Diagnosis not present

## 2024-02-06 DIAGNOSIS — F3132 Bipolar disorder, current episode depressed, moderate: Secondary | ICD-10-CM | POA: Diagnosis not present

## 2024-02-06 NOTE — Telephone Encounter (Unsigned)
 Copied from CRM [id number]. Topic: Clinical - Medication Refill >> Feb 06, 2024  1:44 PM Mercer PEDLAR wrote: Medication: ALPRAZolam  (XANAX ) 0.5 MG tablet zolpidem  (AMBIEN ) 5 MG tablet sertraline  (ZOLOFT ) 25 MG tablet  Patient is requesting a bridge prescriptions until she is able to see psychiatrist on 03/07/24  Has the patient contacted their pharmacy? Yes (Agent: If no, request that the patient contact the pharmacy for the refill. If patient does not wish to contact the pharmacy document the reason why and proceed with request.) (Agent: If yes, when and what did the pharmacy advise?)  This is the patient's preferred pharmacy:  [facility] [address] ([address] [place] - [address] [place] ([place]) [place] [address] Phone: [phone] Fax: [phone]  Is this the correct pharmacy for this prescription? Yes If no, delete pharmacy and type the correct one.   Has the prescription been filled recently? No  Is the patient out of the medication? Yes  Has the patient been seen for an appointment in the last year OR does the patient have an upcoming appointment? Yes  Can we respond through MyChart? Yes  Agent: Please be advised that Rx refills may take up to 3 business days. We ask that you follow-up with your pharmacy.

## 2024-02-20 ENCOUNTER — Encounter (HOSPITAL_COMMUNITY): Payer: Self-pay | Admitting: Emergency Medicine

## 2024-02-20 ENCOUNTER — Emergency Department (HOSPITAL_COMMUNITY)

## 2024-02-20 ENCOUNTER — Emergency Department (HOSPITAL_COMMUNITY)
Admission: EM | Admit: 2024-02-20 | Discharge: 2024-02-20 | Discharge status: Against Medical Advice | Attending: Emergency Medicine | Admitting: Emergency Medicine

## 2024-02-20 DIAGNOSIS — R7989 Other specified abnormal findings of blood chemistry: Secondary | ICD-10-CM | POA: Diagnosis not present

## 2024-02-20 DIAGNOSIS — K5900 Constipation, unspecified: Secondary | ICD-10-CM | POA: Insufficient documentation

## 2024-02-20 DIAGNOSIS — Z859 Personal history of malignant neoplasm, unspecified: Secondary | ICD-10-CM | POA: Diagnosis not present

## 2024-02-20 DIAGNOSIS — R0602 Shortness of breath: Secondary | ICD-10-CM | POA: Diagnosis not present

## 2024-02-20 DIAGNOSIS — R1030 Lower abdominal pain, unspecified: Secondary | ICD-10-CM | POA: Diagnosis present

## 2024-02-20 DIAGNOSIS — F3132 Bipolar disorder, current episode depressed, moderate: Secondary | ICD-10-CM | POA: Diagnosis not present

## 2024-02-20 DIAGNOSIS — R079 Chest pain, unspecified: Secondary | ICD-10-CM | POA: Diagnosis not present

## 2024-02-20 DIAGNOSIS — R000 Tachycardia, unspecified: Secondary | ICD-10-CM | POA: Diagnosis not present

## 2024-02-20 DIAGNOSIS — Q613 Polycystic kidney, unspecified: Secondary | ICD-10-CM | POA: Diagnosis not present

## 2024-02-20 LAB — CBC WITH DIFFERENTIAL/PLATELET
Abs Immature Granulocytes: 0.03 K/uL (ref 0.00–0.07)
Basophils Absolute: 0 K/uL (ref 0.0–0.1)
Basophils Relative: 1 %
Eosinophils Absolute: 0.2 K/uL (ref 0.0–0.5)
Eosinophils Relative: 2 %
HCT: 39.8 % (ref 36.0–46.0)
Hemoglobin: 12.2 g/dL (ref 12.0–15.0)
Immature Granulocytes: 0 %
Lymphocytes Relative: 19 %
Lymphs Abs: 1.7 K/uL (ref 0.7–4.0)
MCH: 26.8 pg (ref 26.0–34.0)
MCHC: 30.7 g/dL (ref 30.0–36.0)
MCV: 87.5 fL (ref 80.0–100.0)
Monocytes Absolute: 0.6 K/uL (ref 0.1–1.0)
Monocytes Relative: 7 %
Neutro Abs: 6.1 K/uL (ref 1.7–7.7)
Neutrophils Relative %: 71 %
Platelets: 344 K/uL (ref 150–400)
RBC: 4.55 MIL/uL (ref 3.87–5.11)
RDW: 14.6 % (ref 11.5–15.5)
WBC: 8.7 K/uL (ref 4.0–10.5)
nRBC: 0 % (ref 0.0–0.2)

## 2024-02-20 LAB — URINALYSIS, ROUTINE W REFLEX MICROSCOPIC
Bilirubin Urine: NEGATIVE
Glucose, UA: NEGATIVE mg/dL
Ketones, ur: NEGATIVE mg/dL
Nitrite: NEGATIVE
Protein, ur: 100 mg/dL — AB
RBC / HPF: >50 RBC/hpf (ref 0–5)
Specific Gravity, Urine: 1.017 (ref 1.005–1.030)
pH: 5 (ref 5.0–8.0)

## 2024-02-20 LAB — COMPREHENSIVE METABOLIC PANEL WITH GFR
ALT: 9 U/L (ref 0–44)
AST: 17 U/L (ref 15–41)
Albumin: 4.6 g/dL (ref 3.5–5.0)
Alkaline Phosphatase: 74 U/L (ref 38–126)
Anion gap: 13 (ref 5–15)
BUN: 24 mg/dL — ABNORMAL HIGH (ref 6–20)
CO2: 24 mmol/L (ref 22–32)
Calcium: 10 mg/dL (ref 8.9–10.3)
Chloride: 101 mmol/L (ref 98–111)
Creatinine, Ser: 1.67 mg/dL — ABNORMAL HIGH (ref 0.44–1.00)
GFR, Estimated: 40 mL/min — ABNORMAL LOW (ref >60)
Glucose, Bld: 105 mg/dL — ABNORMAL HIGH (ref 70–99)
Potassium: 3.7 mmol/L (ref 3.5–5.1)
Sodium: 138 mmol/L (ref 135–145)
Total Bilirubin: 0.5 mg/dL (ref 0.0–1.2)
Total Protein: 8 g/dL (ref 6.5–8.1)

## 2024-02-20 LAB — D-DIMER, QUANTITATIVE: D-Dimer, Quant: 1.36 ug{FEU}/mL — ABNORMAL HIGH (ref 0.00–0.50)

## 2024-02-20 LAB — TROPONIN T, HIGH SENSITIVITY
Troponin T High Sensitivity: <15 ng/L (ref 0–19)
Troponin T High Sensitivity: <15 ng/L (ref 0–19)

## 2024-02-20 LAB — WET PREP, GENITAL
Clue Cells Wet Prep HPF POC: NONE SEEN
Sperm: NONE SEEN
Trich, Wet Prep: NONE SEEN
WBC, Wet Prep HPF POC: <10 (ref <10)
Yeast Wet Prep HPF POC: NONE SEEN

## 2024-02-20 LAB — HCG, SERUM, QUALITATIVE: Preg, Serum: NEGATIVE

## 2024-02-20 LAB — LIPASE, BLOOD: Lipase: 14 U/L (ref 11–51)

## 2024-02-20 MED ORDER — SMOG ENEMA
960.0000 mL | Freq: Once | RECTAL | Status: DC
  Filled 2024-02-20: qty 960

## 2024-02-20 MED ORDER — FENTANYL CITRATE (PF) 50 MCG/ML IJ SOSY
25.0000 ug | PREFILLED_SYRINGE | Freq: Once | INTRAMUSCULAR | Status: AC
  Administered 2024-02-20: 25 ug via INTRAVENOUS
  Filled 2024-02-20: qty 1

## 2024-02-20 MED ORDER — POLYETHYLENE GLYCOL 3350 17 G PO PACK: 17.0000 g | PACK | Freq: Every day | ORAL | 0 refills | Status: DC

## 2024-02-20 MED ORDER — SENNOSIDES-DOCUSATE SODIUM 8.6-50 MG PO TABS: 1.0000 | ORAL_TABLET | Freq: Every evening | ORAL | 0 refills | Status: AC | PRN

## 2024-02-20 MED ORDER — CEPHALEXIN 500 MG PO CAPS: 500.0000 mg | ORAL_CAPSULE | Freq: Three times a day (TID) | ORAL | 0 refills | Status: DC

## 2024-02-20 MED ORDER — FLEET ENEMA RE ENEM: 1.0000 | ENEMA | Freq: Once | RECTAL | 0 refills | Status: AC

## 2024-02-20 MED ORDER — IOHEXOL 350 MG/ML SOLN
80.0000 mL | Freq: Once | INTRAVENOUS | Status: AC | PRN
  Administered 2024-02-20: 80 mL via INTRAVENOUS
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
## 2024-02-20 NOTE — ED Notes (Signed)
 Patient made aware a urine sample was needed. She said she could not provide one at this time.

## 2024-02-20 NOTE — ED Provider Triage Note (Signed)
 Emergency Medicine Provider Triage Evaluation Note  Robin Horn , a 38 y.o. female  was evaluated in triage.  Pt complains of constipation and pelvic pressure.  She states she put her fingers in her vagina and felt something hard.  She denies being pregnant.  She complains of chest pain and shortness of breath.  States  why is my belly so big.  Complains of chest pain and shortness of breath x 2 days  Review of Systems  Positive:  Negative:   Physical Exam  BP (!) 153/107 (BP Location: Left Arm)   Pulse (!) 133   Temp 98.4 F (36.9 C) (Oral)   Resp 18   Ht 5' 1 (1.549 m)   Wt 76 kg   SpO2 100%   BMI 31.66 kg/m  Gen:   Awake, no distress, appears uncomfortable Resp:  Normal effort  MSK:   Moves extremities without difficulty  Other:  Lower abdominal tenderness  Medical Decision Making  Medically screening exam initiated at 3:26 PM.  Appropriate orders placed.  Robin Horn was informed that the remainder of the evaluation will be completed by another provider, this initial triage assessment does not replace that evaluation, and the importance of remaining in the ED until their evaluation is complete.  Given lack of available beds and patient's presenting complaints, pelvic performed in triage without evidence of obvious active delivery. POCUS then available without obvious pregnancy. Will proceed with labs and rest of work up.    Donnajean Lynwood DEL, PA-C 02/20/24 1603

## 2024-02-20 NOTE — ED Provider Notes (Signed)
 [place] [facility] AT [facility] Provider Note   CSN: [id number] Arrival date & time: 02/20/24  1411     Patient presents with: Constipation   Robin Horn is a 38 y.o. female history of bipolar, polycystic kidney disease, presents with complaints of suprapubic abdominal pain x 1 week.  Additionally complains of chest pain and shortness of breath.  Has vaginal bleeding without discharge or urinary symptoms.  No diarrhea, nausea or vomiting.  Reports her last bowel movement has been weeks ago.  Denies any abdominal surgeries.  She denies chance of being pregnant however in triage she complained of pelvic pressure and feeling something hard when she put her fingers in her vagina.  Reports history of cardiac issues described as  is only pumping 50%.  No history of blood clots.    Constipation  Past Medical History:  Diagnosis Date   Abnormal Pap smear of cervix    Anemia    Anxiety    Arthritis    Bipolar disorder (HCC)    diagnosed years ago   Cancer (HCC)    Chlamydia    Depression    Drug use affecting pregnancy, antepartum 05/25/2016   History of cocaine use   Encounter for dental examination 04/12/2021   Family history of breast cancer 01/18/2021   GDM (gestational diabetes mellitus)    GERD (gastroesophageal reflux disease)    pt used to take omeprazole, doesn't have much issues with it anymore   Gonorrhea    Herpes genitalia    History of hiatal hernia    History of postpartum hemorrhage, currently pregnant 05/25/2016   History of radiation therapy    Left Chest wall- 09/27/21-11/25/21- Dr. Lynwood Nasuti   Migraines    Ovarian cyst    Polycystic kidney disease    Positive GBS test 09/23/2016   Preeclampsia    Pregnancy affected by fetal growth restriction 09/20/2016   Baby B. Getting weekly dopplers, AFI.    Pregnancy induced hypertension    Past Surgical History:  Procedure Laterality Date   BREAST RECONSTRUCTION WITH PLACEMENT OF  TISSUE EXPANDER AND FLEX HD (ACELLULAR HYDRATED DERMIS) Bilateral 05/23/2021   Procedure: BREAST RECONSTRUCTION WITH PLACEMENT OF TISSUE EXPANDER AND FLEX HD (ACELLULAR HYDRATED DERMIS);  Surgeon: Elisabeth Craig RAMAN, MD;  Location: [facility] OR;  Service: Plastics;  Laterality: Bilateral;   COLPOSCOPY     DILATION AND CURETTAGE OF UTERUS     MASTECTOMY W/ SENTINEL NODE BIOPSY Left 05/23/2021   Procedure: LEFT MASTECTOMY WITH LEFT AXILLARY SENTINEL LYMPH NODE BIOPSY;  Surgeon: Ebbie Cough, MD;  Location: [facility] OR;  Service: General;  Laterality: Left;   PORT A CATH REVISION Right 01/31/2021   Procedure: PORT A CATH REVISION;  Surgeon: Ebbie Cough, MD;  Location: [facility];  Service: General;  Laterality: Right;   PORTACATH PLACEMENT N/A 01/06/2021   Procedure: INSERTION PORT-A-CATH;  Surgeon: Ebbie Cough, MD;  Location: [facility] OR;  Service: General;  Laterality: N/A;   RADIOACTIVE SEED GUIDED AXILLARY SENTINEL LYMPH NODE Left 05/23/2021   Procedure: RADIOACTIVE SEED GUIDED LEFT AXILLARY SENTINEL LYMPH NODE EXCISION;  Surgeon: Ebbie Cough, MD;  Location: [facility] OR;  Service: General;  Laterality: Left;   TONSILLECTOMY     TOTAL MASTECTOMY Right 05/23/2021   Procedure: RIGHT TOTAL MASTECTOMY;  Surgeon: Ebbie Cough, MD;  Location: [facility] OR;  Service: General;  Laterality: Right;   TUBAL LIGATION Bilateral 09/24/2016   Procedure: POST PARTUM TUBAL LIGATION;  Surgeon: Corene Coy, MD;  Location:  [facility];  Service: Gynecology;  Laterality: Bilateral;       Prior to Admission medications   Medication Sig Start Date End Date Taking? Authorizing Provider  cephALEXin  (KEFLEX ) 500 MG capsule Take 1 capsule (500 mg total) by mouth 3 (three) times daily. 02/20/24  Yes Donnajean Lynwood DEL, PA-C  polyethylene glycol (MIRALAX ) 17 g packet Take 17 g by mouth daily. 02/20/24  Yes Donnajean Lynwood DEL, PA-C  ALPRAZolam  (XANAX ) 0.5 MG tablet Take 1 tablet (0.5 mg total) by  mouth 2 (two) times daily as needed. for anxiety 10/01/23   Addie Perkins, DO  lisinopril  (ZESTRIL ) 10 MG tablet Take 1 tablet (10 mg total) by mouth daily. 10/11/23   Lorren Greig PARAS, NP  medroxyPROGESTERone  Acetate 150 MG/ML SUSY INJECT 1ML INTO THE SKIN EVERY 3 MONTHS 01/03/24   Gudena, Vinay, MD  naloxone [facility]) nasal spray 4 mg/0.1 mL SMARTSIG:Both Nares 09/28/23   [provider]  oxyCODONE  (OXY IR/ROXICODONE ) 5 MG immediate release tablet Take 5 mg by mouth 4 (four) times daily as needed. 11/13/23   [provider]  senna-docusate (SENOKOT S) 8.6-50 MG tablet Take 1 tablet by mouth at bedtime as needed for mild constipation. 02/20/24   Donnajean Lynwood DEL, PA-C  sertraline  (ZOLOFT ) 25 MG tablet Take 1 tablet (25 mg total) by mouth daily. 11/24/23   Rising, Asberry, PA-C  zolpidem  (AMBIEN ) 5 MG tablet Take 1 tablet (5 mg total) by mouth at bedtime as needed. for sleep 09/27/23   Jolaine Pac, DO    Allergies: Hydrocodone -acetaminophen , Tramadol , and Lactose intolerance (gi)    Review of Systems  Gastrointestinal:  Positive for constipation.    Updated Vital Signs BP (!) 136/98   Pulse 90   Temp 98.2 F (36.8 C) (Oral)   Resp (!) 23   Ht 5' 1 (1.549 m)   Wt 76 kg   LMP 02/13/2024   SpO2 100%   BMI 31.66 kg/m   Physical Exam Vitals and nursing note reviewed. Exam conducted with a chaperone present.  Constitutional:      General: She is not in acute distress.    Appearance: She is well-developed.  HENT:     Head: Normocephalic and atraumatic.  Eyes:     Conjunctiva/sclera: Conjunctivae normal.  Cardiovascular:     Rate and Rhythm: Normal rate and regular rhythm.     Heart sounds: No murmur heard. Pulmonary:     Effort: Pulmonary effort is normal. No respiratory distress.     Breath sounds: Normal breath sounds.  Abdominal:     Palpations: Abdomen is soft.     Tenderness: There is abdominal tenderness.     Comments: Abdomen is somewhat distended, has  suprapubic abdominal tenderness  Genitourinary:    Comments: Blood noted in the vaginal vault without discharge, does have cervical motion tenderness Musculoskeletal:        General: No swelling.     Cervical back: Neck supple.  Skin:    General: Skin is warm and dry.     Capillary Refill: Capillary refill takes less than 2 seconds.  Neurological:     Mental Status: She is alert.  Psychiatric:        Mood and Affect: Mood normal.     (all labs ordered are listed, but only abnormal results are displayed) Labs Reviewed  COMPREHENSIVE METABOLIC PANEL WITH GFR - Abnormal; Notable for the following components:      Result Value   Glucose, Bld 105 (*)  BUN 24 (*)    Creatinine, Ser 1.67 (*)    GFR, Estimated 40 (*)    All other components within normal limits  URINALYSIS, ROUTINE W REFLEX MICROSCOPIC - Abnormal; Notable for the following components:   Color, Urine RED (*)    APPearance CLOUDY (*)    Hgb urine dipstick LARGE (*)    Protein, ur 100 (*)    Leukocytes,Ua TRACE (*)    Bacteria, UA MANY (*)    All other components within normal limits  D-DIMER, QUANTITATIVE - Abnormal; Notable for the following components:   D-Dimer, Quant 1.36 (*)    All other components within normal limits  WET PREP, GENITAL  CBC WITH DIFFERENTIAL/PLATELET  LIPASE, BLOOD  HCG, SERUM, QUALITATIVE  GC/CHLAMYDIA PROBE AMP () NOT AT [facility]  TROPONIN T, HIGH SENSITIVITY  TROPONIN T, HIGH SENSITIVITY    EKG: EKG Interpretation Date/Time:  Wednesday February 20 2024 14:39:56 EDT Ventricular Rate:  129 PR Interval:  115 QRS Duration:  80 QT Interval:  298 QTC Calculation: 437 R Axis:   71  Text Interpretation: Sinus tachycardia Borderline repolarization abnormality Since last tracing rate faster Confirmed by Randol Simmonds [phone]) on 02/20/2024 2:42:28 PM  Radiology: No results found.   Procedures   Medications Ordered in the ED  fentaNYL  (SUBLIMAZE ) injection 25 mcg (25 mcg  Intravenous Given 02/20/24 1809)  iohexol  (OMNIPAQUE ) 350 MG/ML injection 80 mL (80 mLs Intravenous Contrast Given 02/20/24 1839)    Clinical Course as of 02/22/24 2155  Wed Feb 20, 2024  1753 Patient evaluated for complaints of suprapubic abdominal pain with associated vaginal bleeding has been ongoing for the past week is additionally complaining of persistent chest pain and shortness of breath as well.  On arrival patient appeared uncomfortable was tachycardic to 133.  In triage she made statements such as  why is my stomach so full and there is something hard in my vagina.  Although she denied any chance of being pregnant a pelvic exam and ultrasound was performed to rule out active pregnancy given her presentation.  Will proceed with labs and CT imaging. [JT]  1833 CBC with Differential Unremarkable [JT]  1833 Comprehensive metabolic panel(!) No significant abnormality, creatinine at baseline [JT]  1833 Wet prep, genital Negative [JT]  1833 D-dimer, quantitative(!) Dimer elevated 1.36, will obtain CT PE study.  No calf pain, swelling or tenderness to warrant ultrasounds at this time [JT]  1833 Lipase, blood Within normal limit [JT]  1833 Troponin T, High Sensitivity Without elevated [JT]  1858 CT Angio Chest PE W and/or Wo Contrast No acute intrathoracic, abdominal or pelvic pathology.  There does appear to be significant distal stool burden however.  Will provide enema [JT]  2011 Urinalysis, Routine w reflex microscopic -Urine, Clean Catch(!) Trace leukocytes, negative nitrites, 21-50 WBCs and many bacteria, however 21-50 squamous cells.  Will send in culture and treat empirically [JT]  2107 Appears that patient eloped before receiving an enema.  As discussed with the patient earlier we will sending a stool regiment to her pharmacy. [JT]    Clinical Course User Index [JT] Donnajean Lynwood DEL, PA-C                                 Medical Decision Making Amount and/or Complexity  of Data Reviewed Labs: ordered. Decision-making details documented in ED Course. Radiology: ordered. Decision-making details documented in ED Course.  Risk OTC drugs. Prescription  drug management.   This patient presents to the ED with chief complaint(s) of Abdominal pain.  The complaint involves an extensive differential diagnosis and also carries with it a high risk of complications and morbidity.   Pertinent past medical history as listed in HPI  The differential diagnosis includes  No evidence of PE on imaging.  Has no lower extremity edema, pain or tenderness to suggest DVT.  No vaginal discharge to suggest STI or PID.  Pregnancy test is negative.  CT abdomen pelvis is negative for any acute intra-abdominal pathology  Additional history obtained:  Records reviewed Care Everywhere/External Records  Disposition:   Patient eloped before receiving an enema.  Bowel regiment sent to pharmacy however as had been discussed.  Social Determinants of Health:   Patient's impaired access to primary care  increases the complexity of managing their presentation  This note was dictated with voice recognition software.  Despite best efforts at proofreading, errors may have occurred which can change the documentation meaning.       Final diagnoses:  Constipation, unspecified constipation type    ED Discharge Orders          Ordered    senna-docusate (SENOKOT S) 8.6-50 MG tablet  At bedtime PRN        02/20/24 2016    polyethylene glycol (MIRALAX ) 17 g packet  Daily        02/20/24 2016    sodium phosphate  (FLEET) ENEM   Once        02/20/24 2108    cephALEXin  (KEFLEX ) 500 MG capsule  3 times daily        02/20/24 2109               Donnajean Lynwood DEL, PA-C 02/22/24 2155    Charlyn Sora, MD 02/23/24 1409

## 2024-02-20 NOTE — ED Notes (Signed)
 Patient left after her IV was removed Per Delrae, RN.

## 2024-02-21 LAB — GC/CHLAMYDIA PROBE AMP (~~LOC~~) NOT AT ARMC
Chlamydia: NEGATIVE
Comment: NEGATIVE
Comment: NORMAL
Neisseria Gonorrhea: NEGATIVE

## 2024-02-29 DIAGNOSIS — F3132 Bipolar disorder, current episode depressed, moderate: Secondary | ICD-10-CM | POA: Diagnosis not present

## 2024-03-20 ENCOUNTER — Ambulatory Visit

## 2024-03-20 ENCOUNTER — Other Ambulatory Visit: Payer: Self-pay

## 2024-03-20 VITALS — BP 132/76 | HR 96 | Temp 98.2°F | Resp 28 | Ht 61.0 in | Wt 181.8 lb

## 2024-03-20 DIAGNOSIS — C50412 Malignant neoplasm of upper-outer quadrant of left female breast: Secondary | ICD-10-CM | POA: Diagnosis not present

## 2024-03-20 DIAGNOSIS — I10 Essential (primary) hypertension: Secondary | ICD-10-CM | POA: Diagnosis not present

## 2024-03-20 DIAGNOSIS — Z87891 Personal history of nicotine dependence: Secondary | ICD-10-CM

## 2024-03-20 DIAGNOSIS — Z79810 Long term (current) use of selective estrogen receptor modulators (SERMs): Secondary | ICD-10-CM

## 2024-03-20 DIAGNOSIS — F4310 Post-traumatic stress disorder, unspecified: Secondary | ICD-10-CM

## 2024-03-20 DIAGNOSIS — F333 Major depressive disorder, recurrent, severe with psychotic symptoms: Secondary | ICD-10-CM

## 2024-03-20 DIAGNOSIS — K5900 Constipation, unspecified: Secondary | ICD-10-CM

## 2024-03-20 DIAGNOSIS — R7989 Other specified abnormal findings of blood chemistry: Secondary | ICD-10-CM | POA: Diagnosis not present

## 2024-03-20 DIAGNOSIS — F4321 Adjustment disorder with depressed mood: Secondary | ICD-10-CM

## 2024-03-20 DIAGNOSIS — F319 Bipolar disorder, unspecified: Secondary | ICD-10-CM

## 2024-03-20 DIAGNOSIS — Z170 Estrogen receptor positive status [ER+]: Secondary | ICD-10-CM | POA: Diagnosis not present

## 2024-03-20 DIAGNOSIS — Z79899 Other long term (current) drug therapy: Secondary | ICD-10-CM

## 2024-03-20 MED ORDER — ZOLPIDEM TARTRATE 5 MG PO TABS: 5.0000 mg | ORAL_TABLET | Freq: Every evening | ORAL | 0 refills | Status: DC | PRN
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
## 2024-03-20 NOTE — Patient Instructions (Addendum)
 Today we discussed the following medical conditions and plan:   - Continue taking your lisinopril  for blood pressure, continue taking your methadone.  - Continue following up with your psychologist.  - I put in a referral to psychiatry  - Reach out to support groups - Get involved within a church  - I refilled your previous prescription of ambien  to help you sleep. We do not want this to be a long term medication. As discussed you will take this with the only other two medicaitons you mentioned taking daily which was lisinopril  and methadone.    We look forward to seeing you next time. Please call our clinic at [phone] if you have any questions or concerns. The best time to call is Monday-Friday from 9am-4pm, but there is someone available 24/7. If you need medication refills, please notify your pharmacy one week in advance and they will send us  a request.   Thank you for trusting me with your care. Wishing you the best!   Sallyanne Primas, DO  [facility]

## 2024-03-21 NOTE — Assessment & Plan Note (Signed)
 Vitals:   03/20/24 1551  BP: 132/76   Medication management includes lisinopril  10 mg daily.  - continue lisinopril  10 mg daily
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
## 2024-03-21 NOTE — Assessment & Plan Note (Signed)
 Patient has history of breast cancer 12/23/2020, followed by heme/onc. S/p surgery, Tamoxifen  started 12/13/2021, had hot flashes so dose was lowered, breast exam 04/06/2023 benign, recommended f/u in 1 year. Per patient, she is restarting chemo. Unable to find any documentation outside note from Dr. Odean 04/2023. Appointment scheduled for 04/08/2024 - encouraged f/u with Gudena, Vinay MD oncologist

## 2024-03-22 NOTE — Progress Notes (Signed)
 Internal Medicine Clinic Attending  I was physically present during the key portions of the resident provided service and participated in the medical decision making of patient's management care. I reviewed pertinent patient test results.  The assessment, diagnosis, and plan were formulated together and I agree with the documentation in the resident's note.  Francesco Elsie NOVAK, MD

## 2024-03-23 ENCOUNTER — Other Ambulatory Visit: Payer: Self-pay | Admitting: Hematology and Oncology

## 2024-03-25 NOTE — Telephone Encounter (Signed)
 Pharmacy is requesting a new rx to be sent by a provider with a active DEA (not able to us  rx sent by Dr.Rihner)  Novella Marylu Jolaine Toma)

## 2024-03-25 NOTE — Telephone Encounter (Signed)
 Spoke with patient on the phone 03/25/2024 regarding medications.   Patient expressed that she would prefer to not be treated by IMTS due to the rotation of doctors and would prefer treatment with one provider outside of our clinic due to the sensitivity of her case. I explained to the patient that we would no longer be prescribing medications so that her new primary care team/monarch would take over her care. Patient stated understanding. Recent prescription of clonazepam was sent in and picked up by patient 03/24/2024 by [facility]. Ambien  prescription canceled.   Akosua Constantine, DO

## 2024-03-25 NOTE — Addendum Note (Signed)
 Addended by: BENUEL BRAUN C on: 03/25/2024 03:18 PM   Modules accepted: Orders

## 2024-04-02 ENCOUNTER — Other Ambulatory Visit: Payer: Self-pay

## 2024-04-02 ENCOUNTER — Encounter (HOSPITAL_BASED_OUTPATIENT_CLINIC_OR_DEPARTMENT_OTHER): Payer: Self-pay

## 2024-04-02 ENCOUNTER — Emergency Department (HOSPITAL_BASED_OUTPATIENT_CLINIC_OR_DEPARTMENT_OTHER)
Admission: EM | Admit: 2024-04-02 | Discharge: 2024-04-02 | Disposition: A | Discharge status: Home / Self Care | Attending: Emergency Medicine | Admitting: Emergency Medicine

## 2024-04-02 DIAGNOSIS — Z853 Personal history of malignant neoplasm of breast: Secondary | ICD-10-CM | POA: Diagnosis not present

## 2024-04-02 DIAGNOSIS — K047 Periapical abscess without sinus: Secondary | ICD-10-CM | POA: Diagnosis not present

## 2024-04-02 DIAGNOSIS — K0889 Other specified disorders of teeth and supporting structures: Secondary | ICD-10-CM | POA: Diagnosis present

## 2024-04-02 LAB — LACTIC ACID, PLASMA: Lactic Acid, Venous: 1.1 mmol/L (ref 0.5–1.9)

## 2024-04-02 LAB — COMPREHENSIVE METABOLIC PANEL WITH GFR
ALT: 8 U/L (ref 0–44)
AST: 16 U/L (ref 15–41)
Albumin: 4.2 g/dL (ref 3.5–5.0)
Alkaline Phosphatase: 78 U/L (ref 38–126)
Anion gap: 11 (ref 5–15)
BUN: 19 mg/dL (ref 6–20)
CO2: 24 mmol/L (ref 22–32)
Calcium: 9.1 mg/dL (ref 8.9–10.3)
Chloride: 105 mmol/L (ref 98–111)
Creatinine, Ser: 1.44 mg/dL — ABNORMAL HIGH (ref 0.44–1.00)
GFR, Estimated: 48 mL/min — ABNORMAL LOW (ref >60)
Glucose, Bld: 94 mg/dL (ref 70–99)
Potassium: 3.9 mmol/L (ref 3.5–5.1)
Sodium: 140 mmol/L (ref 135–145)
Total Bilirubin: <0.2 mg/dL (ref 0.0–1.2)
Total Protein: 7 g/dL (ref 6.5–8.1)

## 2024-04-02 LAB — CBC WITH DIFFERENTIAL/PLATELET
Abs Immature Granulocytes: 0.02 K/uL (ref 0.00–0.07)
Basophils Absolute: 0 K/uL (ref 0.0–0.1)
Basophils Relative: 0 %
Eosinophils Absolute: 0.3 K/uL (ref 0.0–0.5)
Eosinophils Relative: 4 %
HCT: 33.4 % — ABNORMAL LOW (ref 36.0–46.0)
Hemoglobin: 11 g/dL — ABNORMAL LOW (ref 12.0–15.0)
Immature Granulocytes: 0 %
Lymphocytes Relative: 23 %
Lymphs Abs: 1.8 K/uL (ref 0.7–4.0)
MCH: 28.5 pg (ref 26.0–34.0)
MCHC: 32.9 g/dL (ref 30.0–36.0)
MCV: 86.5 fL (ref 80.0–100.0)
Monocytes Absolute: 0.5 K/uL (ref 0.1–1.0)
Monocytes Relative: 7 %
Neutro Abs: 5.2 K/uL (ref 1.7–7.7)
Neutrophils Relative %: 66 %
Platelets: 281 K/uL (ref 150–400)
RBC: 3.86 MIL/uL — ABNORMAL LOW (ref 3.87–5.11)
RDW: 15.1 % (ref 11.5–15.5)
WBC: 8 K/uL (ref 4.0–10.5)
nRBC: 0 % (ref 0.0–0.2)

## 2024-04-02 MED ORDER — PENICILLIN V POTASSIUM 250 MG PO TABS
500.0000 mg | ORAL_TABLET | Freq: Once | ORAL | Status: AC
  Administered 2024-04-02: 500 mg via ORAL
  Filled 2024-04-02: qty 2

## 2024-04-02 MED ORDER — OXYCODONE-ACETAMINOPHEN 5-325 MG PO TABS
1.0000 | ORAL_TABLET | Freq: Once | ORAL | Status: AC
  Administered 2024-04-02: 1 via ORAL
  Filled 2024-04-02: qty 1

## 2024-04-02 MED ORDER — OXYCODONE-ACETAMINOPHEN 5-325 MG PO TABS: 1.0000 | ORAL_TABLET | Freq: Four times a day (QID) | ORAL | 0 refills | Status: AC | PRN

## 2024-04-02 MED ORDER — PENICILLIN V POTASSIUM 500 MG PO TABS: 500.0000 mg | ORAL_TABLET | Freq: Four times a day (QID) | ORAL | 0 refills | Status: AC

## 2024-04-02 NOTE — ED Provider Notes (Signed)
 [place] EMERGENCY DEPARTMENT AT [facility] Provider Note   CSN: [id number] Arrival date & time: 04/02/24  8251     Patient presents with: Dental Pain   Robin Horn is a 38 y.o. female.  She is here with complaint of dental pain left lower molar that has been going on for 2 weeks.  She said she called her dentist but cannot get an appointment for a few more days.  Tylenol  not helping.  She has a history of breast cancer but is not currently on chemotherapy.  No fever no difficulty speaking or swallowing.  {Add pertinent medical, surgical, social history, OB history to YEP:67052} The history is provided by the patient.  Dental Pain Location:  Lower Lower teeth location:  19/LL 1st molar Quality:  Throbbing Onset quality:  Gradual Duration:  2 weeks Timing:  Constant Progression:  Worsening Chronicity:  New Relieved by:  Nothing Ineffective treatments:  Acetaminophen  Associated symptoms: no difficulty swallowing, no fever, no oral bleeding and no trismus   Risk factors: cancer        Prior to Admission medications   Medication Sig Start Date End Date Taking? Authorizing Provider  lisinopril  (ZESTRIL ) 10 MG tablet Take 1 tablet (10 mg total) by mouth daily. 10/11/23   Jaycee Greig PARAS, NP  medroxyPROGESTERone  Acetate 150 MG/ML SUSY INJECT 1 ML INTRAMUSCULARLY EVERY 3 MONTHS 03/24/24   Gudena, Vinay, MD  naloxone [facility]) nasal spray 4 mg/0.1 mL SMARTSIG:Both Nares 09/28/23   [provider]  senna-docusate (SENOKOT S) 8.6-50 MG tablet Take 1 tablet by mouth at bedtime as needed for mild constipation. 02/20/24   Donnajean Lynwood DEL, PA-C    Allergies: Hydrocodone -acetaminophen , Tramadol , and Lactose intolerance (gi)    Review of Systems  Constitutional:  Negative for fever.    Updated Vital Signs BP (!) 135/101   Pulse (!) 113   Temp 98.1 F (36.7 C) (Oral)   Resp 19   Ht 5' 1 (1.549 m)   Wt 84.4 kg   SpO2 100%   BMI 35.14 kg/m   Physical  Exam Constitutional:      Appearance: Normal appearance. She is well-developed.  HENT:     Head: Normocephalic and atraumatic.     Nose: Nose normal.     Mouth/Throat:     Mouth: Mucous membranes are moist.     Pharynx: Oropharynx is clear.   Eyes:     Conjunctiva/sclera: Conjunctivae normal.  Musculoskeletal:     Cervical back: Neck supple.  Skin:    General: Skin is warm and dry.  Neurological:     Mental Status: She is alert.     GCS: GCS eye subscore is 4. GCS verbal subscore is 5. GCS motor subscore is 6.     (all labs ordered are listed, but only abnormal results are displayed) Labs Reviewed  CBC WITH DIFFERENTIAL/PLATELET - Abnormal; Notable for the following components:      Result Value   RBC 3.86 (*)    Hemoglobin 11.0 (*)    HCT 33.4 (*)    All other components within normal limits  LACTIC ACID, PLASMA  LACTIC ACID, PLASMA  COMPREHENSIVE METABOLIC PANEL WITH GFR    EKG: None  Radiology: No results found.  {Document cardiac monitor, telemetry assessment procedure when appropriate:32947} Procedures   Medications Ordered in the ED  oxyCODONE -acetaminophen  (PERCOCET/ROXICET) 5-325 MG per tablet 1 tablet (has no administration in time range)  penicillin  v potassium (VEETID) tablet 500 mg (has no administration in  time range)      {Click here for ABCD2, HEART and other calculators REFRESH Note before signing:1}                              Medical Decision Making Amount and/or Complexity of Data Reviewed Labs: ordered.  Risk Prescription drug management.   This patient complains of ***; this involves an extensive number of treatment Options and is a complaint that carries with it a high risk of complications and morbidity. The differential includes ***  I ordered, reviewed and interpreted labs, which included *** I ordered medication *** and reviewed PMP when indicated. I ordered imaging studies which included *** and I independently     visualized and interpreted imaging which showed *** Additional history obtained from *** Previous records obtained and reviewed *** I consulted *** and discussed lab and imaging findings and discussed disposition.  Cardiac monitoring reviewed, *** Social determinants considered, *** Critical Interventions: ***  After the interventions stated above, I reevaluated the patient and found *** Admission and further testing considered, ***   {Document critical care time when appropriate  Document review of labs and clinical decision tools ie CHADS2VASC2, etc  Document your independent review of radiology images and any outside records  Document your discussion with family members, caretakers and with consultants  Document social determinants of health affecting pt's care  Document your decision making why or why not admission, treatments were needed:32947:::1}   Final diagnoses:  None    ED Discharge Orders     None

## 2024-04-02 NOTE — ED Triage Notes (Signed)
 Pt POV reporting L upper dental pain, concerned for infection.

## 2024-04-02 NOTE — Discharge Instructions (Signed)
 Please finish antibiotics.  Pain medication as needed.  Follow-up with your dentist as scheduled.

## 2024-04-08 ENCOUNTER — Inpatient Hospital Stay: Payer: Commercial Managed Care - HMO | Attending: Hematology and Oncology | Admitting: Hematology and Oncology

## 2024-04-08 VITALS — BP 140/91 | Temp 98.1°F | Resp 18 | Ht 61.0 in | Wt 185.7 lb

## 2024-04-08 DIAGNOSIS — Z923 Personal history of irradiation: Secondary | ICD-10-CM | POA: Diagnosis not present

## 2024-04-08 DIAGNOSIS — Z9221 Personal history of antineoplastic chemotherapy: Secondary | ICD-10-CM | POA: Insufficient documentation

## 2024-04-08 DIAGNOSIS — Z79810 Long term (current) use of selective estrogen receptor modulators (SERMs): Secondary | ICD-10-CM | POA: Insufficient documentation

## 2024-04-08 DIAGNOSIS — C50412 Malignant neoplasm of upper-outer quadrant of left female breast: Secondary | ICD-10-CM | POA: Diagnosis present

## 2024-04-08 DIAGNOSIS — Z170 Estrogen receptor positive status [ER+]: Secondary | ICD-10-CM | POA: Insufficient documentation

## 2024-04-08 DIAGNOSIS — Z9012 Acquired absence of left breast and nipple: Secondary | ICD-10-CM | POA: Diagnosis not present

## 2024-04-08 MED ORDER — TAMOXIFEN CITRATE 10 MG PO TABS: 10.0000 mg | ORAL_TABLET | Freq: Every day | ORAL | 3 refills | Status: AC

## 2024-04-08 NOTE — Assessment & Plan Note (Addendum)
 12/17/2020: Palpable lump in the upper outer left breast. Diagnostic mammogram and US  on 12/15/20 showed multiple suspicious masses, 2 abnormal lymph nodes with increased cortical thickness, 1 lymph node with borderline abnormal cortical thickness, Biopsy on 12/17/20 showed invasive ductal carcinoma and DCIS, Her2+ (3+)/ER+ (80%)/PR- (<1%) in the left breast, and invasive ductal carcinoma, Her2+ (3+)/ER+ (80%)/PR+ (3%) in the left axillary lymph node T1CN1A stage Ib   Treatment plan: 1. Neoadjuvant chemotherapy with [facility] Perjeta  6 cycles completed 04/26/2021 followed by Herceptin  Perjeta  maintenance versus Kadcyla maintenance (based on response to neoadjuvant chemo) for 1 year completed 01/03/2022 2. Left mastectomy 05/23/2021: Negative for residual cancer.  Extensive residual high-grade DCIS, 0/4 lymph nodes negative; Right mastectomy: Benign  Complete pathologic response 3. Followed by adjuvant radiation therapy completed 11/25/2021 4.  Followed by adjuvant antiestrogen therapy -------------------------------------------------------------------------------------------------------------------------------------------------- Current treatment: Tamoxifen  started 12/13/2021 (reduced to 10 mg a day on 04/06/2023) Tamoxifen  toxicities: Severe hot flashes:Advised to lower her Tamoxifen  dose Mood swings Anxiety and panic attacks   Breast cancer surveillance: Breast exam 04/06/2023: Benign.  Bilateral mastectomies.  She has expanders in place.  I instructed her to call plastic surgery and have them removed if she does not want reconstruction. No role of imaging study since she had bilateral mastectomies CT CAP 02/20/2024: No evidence of metastatic disease   She is doing extremely busy with going back to school as well as working part-time as a merchandiser, retail.  She has twins as well.  Return to clinic in 1 year for follow-up

## 2024-04-08 NOTE — Progress Notes (Signed)
 Patient Care Team: Benuel Braun, DO as PCP - General (Internal Medicine) Shannon Agent, MD as Consulting Physician (Radiation Oncology) Odean Potts, MD as Consulting Physician (Hematology and Oncology) Ebbie Cough, MD as Consulting Physician (General Surgery)  DIAGNOSIS:  Encounter Diagnosis  Name Primary?   Malignant neoplasm of upper-outer quadrant of left breast in female, estrogen receptor positive (HCC) Yes    SUMMARY OF ONCOLOGIC HISTORY: Oncology History  Left breast lump (Resolved)  Malignant neoplasm of upper-outer quadrant of left breast in female, estrogen receptor positive (HCC)  12/17/2020 Initial Diagnosis   Palpable lump in the upper outer left breast. Diagnostic mammogram and US  on 12/15/20 showed multiple suspicious masses, 2 abnormal lymph nodes with increased cortical thickness, 1 lymph node with borderline abnormal cortical thickness, Biopsy on 12/17/20 showed invasive ductal carcinoma and DCIS, Her2+ (3+)/ER+ (80%)/PR- (<1%) in the left breast, and invasive ductal carcinoma, Her2+ (3+)/ER+ (80%)/PR+ (3%) in the left axillary lymph node   12/23/2020 Cancer Staging   Staging form: Breast, AJCC 8th Edition - Clinical stage from 12/23/2020: Stage IB (cT2, cN1, cM0, G3, ER+, PR+, HER2+) - Signed by Odean Potts, MD on 12/23/2020 Stage prefix: Initial diagnosis Histologic grading system: 3 grade system   01/11/2021 - 04/29/2021 Chemotherapy   Patient is on Treatment Plan : BREAST  Docetaxel  + Carboplatin  + Trastuzumab  + Pertuzumab   (TCHP) q21d      01/31/2021 Genetic Testing   Negative hereditary cancer genetic testing: no pathogenic variants detected in [facility] Common Hereditary Cancers +RNA Panel.  The report date is January 31, 2021.    The Common Hereditary Cancers + RNA Panel offered by [facility] includes sequencing, deletion/duplication, and RNA testing of the following 47 genes: APC, ATM, AXIN2, BARD1, BMPR1A, BRCA1, BRCA2, BRIP1, CDH1, CDK4*, CDKN2A  (p14ARF)*, CDKN2A (p16INK4a)*, CHEK2, CTNNA1, DICER1, EPCAM (Deletion/duplication testing only), GREM1 (promoter region deletion/duplication testing only), KIT, MEN1, MLH1, MSH2, MSH3, MSH6, MUTYH, NBN, NF1, NHTL1, PALB2, PDGFRA*, PMS2, POLD1, POLE, PTEN, RAD50, RAD51C, RAD51D, SDHB, SDHC, SDHD, SMAD4, SMARCA4. STK11, TP53, TSC1, TSC2, and VHL.  The following genes were evaluated for sequence changes only: SDHA and HOXB13 c.251G>A variant only.  RNA analysis is not performed for the * genes.     05/23/2021 Surgery   Left mastectomy: Negative for residual cancer.  Extensive residual high-grade DCIS, 0/4 lymph nodes negative Right mastectomy: Benign Complete pathologic response   06/08/2021 - 12/13/2021 Chemotherapy   Patient is on Treatment Plan : BREAST Trastuzumab   + Pertuzumab  q21d x 13 cycles     06/16/2021 -  Chemotherapy   Patient is on Treatment Plan : BREAST Trastuzumab  + Pertuzumab  q21d     09/27/2021 - 11/25/2021 Radiation Therapy   Site Technique Total Dose (Gy) Dose per Fx (Gy) Completed Fx Beam Energies  Chest Wall, Left: CW_L 3D 50/50 2 25/25 6X, 10X  Sclav-LT: SCV_L 3D 50/50 2 25/25 6X, 10X  Chest Wall, Left: CW_L_Bst Electron 10/10 2 5/5 6E     11/2021 -  Anti-estrogen oral therapy   Tamoxifen      CHIEF COMPLIANT:   HISTORY OF PRESENT ILLNESS:  History of Present Illness Robin Horn is a 38 year old female with estrogen receptor positive, 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  She completed neoadjuvant TCHP chemotherapy, bilateral mastectomy with tissue expanders, and adjuvant radiation to the left chest wall and supraclavicular region. Pathology showed complete response in the invasive component, extensive high-grade DCIS,  and no residual nodal disease. She then completed adjuvant HER2-targeted therapy and has been on tamoxifen  20 mg daily,  with prior dose reduction for severe hot flashes and anxiety.  She tolerated the lower tamoxifen  dose but had an interruption of about one week due to prescription access issues after relocating.  She retains tissue expanders after mastectomy, is not planning further reconstruction, and is uncomfortable with the implants. She recalls Dr. Elisabeth was involved in her prior surgery.  She reports major psychosocial stressors over the past year, including the death of her oldest son by gun violence, ongoing legal proceedings, and persistent grief and anxiety. She has insomnia, weight gain, and social withdrawal, and her mood and functioning remain affected despite returning to school and completing placement testing.  Apr 06, 2023: Follow-up for breast cancer treatment; patient on Tamoxifen  with dose reduction due to severe hot flashes, anxiety, and panic attacks. Breast exam benign with bilateral mastectomies and expanders in place; referred for breast reconstruction consultation. Severe menstrual periods discussed, Depo-Provera  prescribed; pelvic exam performed for suspected genitourinary infection.     ALLERGIES:  is allergic to hydrocodone -acetaminophen , tramadol , and lactose intolerance (gi).  MEDICATIONS:  Current Outpatient Medications  Medication Sig Dispense Refill   lisinopril  (ZESTRIL ) 10 MG tablet Take 1 tablet (10 mg total) by mouth daily. 90 tablet 0   medroxyPROGESTERone  Acetate 150 MG/ML SUSY INJECT 1 ML INTRAMUSCULARLY EVERY 3 MONTHS 1 mL 0   naloxone (NARCAN) nasal spray 4 mg/0.1 mL SMARTSIG:Both Nares     oxyCODONE -acetaminophen  (PERCOCET/ROXICET) 5-325 MG tablet Take 1 tablet by mouth every 6 (six) hours as needed for severe pain (pain score 7-10). 8 tablet 0   penicillin  v potassium (VEETID) 500 MG tablet Take 1 tablet (500 mg total) by mouth 4 (four) times daily for 7 days. 28 tablet 0   senna-docusate (SENOKOT S) 8.6-50 MG tablet Take 1 tablet by mouth at bedtime as needed for  mild constipation. 30 tablet 0   No current facility-administered medications for this visit.    PHYSICAL EXAMINATION: ECOG PERFORMANCE STATUS: 1 - Symptomatic but completely ambulatory  Vitals:   04/08/24 0912  BP: (!) 140/91  Resp: 18  Temp: 98.1 F (36.7 C)  SpO2: 100%   Filed Weights   04/08/24 0912  Weight: 185 lb 11.2 oz (84.2 kg)    Physical Exam   (exam performed in the presence of a chaperone)  LABORATORY DATA:  I have reviewed the data as listed    Latest Ref Rng & Units 04/02/2024    6:12 PM 02/20/2024    4:05 PM 10/11/2023    9:20 AM  CMP  Glucose 70 - 99 mg/dL 94  894  84   BUN 6 - 20 mg/dL 19  24  23    Creatinine 0.44 - 1.00 mg/dL 8.55  8.32  8.34   Sodium 135 - 145 mmol/L 140  138  140   Potassium 3.5 - 5.1 mmol/L 3.9  3.7  4.2   Chloride 98 - 111 mmol/L 105  101  106   CO2 22 - 32 mmol/L 24  24  19    Calcium  8.9 - 10.3 mg/dL 9.1  89.9  9.0   Total Protein 6.5 - 8.1 g/dL 7.0  8.0    Total Bilirubin 0.0 - 1.2 mg/dL <9.7  0.5    Alkaline Phos 38 - 126 U/L 78  74    AST 15 - 41 U/L 16  17    ALT 0 - 44 U/L  8  9      Lab Results  Component Value Date   WBC 8.0 04/02/2024   HGB 11.0 (L) 04/02/2024   HCT 33.4 (L) 04/02/2024   MCV 86.5 04/02/2024   PLT 281 04/02/2024   NEUTROABS 5.2 04/02/2024    ASSESSMENT & PLAN:  Malignant neoplasm of upper-outer quadrant of left breast in female, estrogen receptor positive (HCC) 12/17/2020: Palpable lump in the upper outer left breast. Diagnostic mammogram and US  on 12/15/20 showed multiple suspicious masses, 2 abnormal lymph nodes with increased cortical thickness, 1 lymph node with borderline abnormal cortical thickness, Biopsy on 12/17/20 showed invasive ductal carcinoma and DCIS, Her2+ (3+)/ER+ (80%)/PR- (<1%) in the left breast, and invasive ductal carcinoma, Her2+ (3+)/ER+ (80%)/PR+ (3%) in the left axillary lymph node T1CN1A stage Ib   Treatment plan: 1. Neoadjuvant chemotherapy with [facility] Perjeta  6  cycles completed 04/26/2021 followed by Herceptin  Perjeta  maintenance versus Kadcyla maintenance (based on response to neoadjuvant chemo) for 1 year completed 01/03/2022 2. Left mastectomy 05/23/2021: Negative for residual cancer.  Extensive residual high-grade DCIS, 0/4 lymph nodes negative; Right mastectomy: Benign  Complete pathologic response 3. Followed by adjuvant radiation therapy completed 11/25/2021 4.  Followed by adjuvant antiestrogen therapy -------------------------------------------------------------------------------------------------------------------------------------------------- Current treatment: Tamoxifen  started 12/13/2021 (reduced to 10 mg a day on 04/06/2023) Tamoxifen  toxicities: Severe hot flashes: Mood swings Anxiety and panic attacks  Patient tells me that she stopped tamoxifen  in 2024 (unclear who stopped it) I recommended that she restart the tamoxifen  at this time.   Breast cancer surveillance: Breast exam 04/06/2023: Benign.  Bilateral mastectomies.  She has expanders in place.  I instructed her to call plastic surgery and have them removed if she does not want reconstruction. No role of imaging study since she had bilateral mastectomies CT CAP 02/20/2024: No evidence of metastatic disease   Her 56 year old son got killed in a gun violence.  She has twins age 47 years old and 2 other children.  She moved in with her mother to help with childcare.  Return to clinic in 1 year for follow-up  Assessment & Plan Breast implant status post-mastectomy Dr. Elisabeth involved in initial surgery, available for follow-up. - Sent referral to Dr. Elisabeth (Surgical Oncology/Breast Surgery) for evaluation and possible removal of breast implants/expanders.      No orders of the defined types were placed in this encounter.  The patient has a good understanding of the overall plan. she agrees with it. she will call with any problems that may develop before the next visit here.  I  personally spent a total of 30 minutes in the care of the patient today including preparing to see the patient, getting/reviewing separately obtained history, performing a medically appropriate exam/evaluation, counseling and educating, placing orders, referring and communicating with other health care professionals, documenting clinical information in the EHR, independently interpreting results, communicating results, and coordinating care.   Viinay K Shemuel Harkleroad, MD 04/08/24

## 2024-04-09 ENCOUNTER — Other Ambulatory Visit: Payer: Self-pay

## 2024-04-30 ENCOUNTER — Other Ambulatory Visit: Payer: Self-pay

## 2024-04-30 DIAGNOSIS — C50412 Malignant neoplasm of upper-outer quadrant of left female breast: Secondary | ICD-10-CM

## 2024-04-30 NOTE — Progress Notes (Signed)
 Referral placed to [facility]) for expander removal.

## 2024-05-08 ENCOUNTER — Other Ambulatory Visit: Payer: Self-pay | Admitting: Hematology and Oncology

## 2025-04-08 ENCOUNTER — Inpatient Hospital Stay: Admitting: Hematology and Oncology
